# Patient Record
Sex: Male | Born: 1955 | Race: White | Hispanic: No | Marital: Married | Smoking: Former smoker
Health system: Southern US, Community
[De-identification: ages and names within clinical notes are randomized; demographics above are authoritative.]

## PROBLEM LIST (undated history)

## (undated) DIAGNOSIS — E785 Hyperlipidemia, unspecified: Secondary | ICD-10-CM

## (undated) DIAGNOSIS — K567 Ileus, unspecified: Secondary | ICD-10-CM

## (undated) DIAGNOSIS — J449 Chronic obstructive pulmonary disease, unspecified: Secondary | ICD-10-CM

## (undated) DIAGNOSIS — I519 Heart disease, unspecified: Secondary | ICD-10-CM

## (undated) DIAGNOSIS — K922 Gastrointestinal hemorrhage, unspecified: Secondary | ICD-10-CM

## (undated) DIAGNOSIS — Z95810 Presence of automatic (implantable) cardiac defibrillator: Secondary | ICD-10-CM

## (undated) DIAGNOSIS — I509 Heart failure, unspecified: Secondary | ICD-10-CM

## (undated) DIAGNOSIS — I2510 Atherosclerotic heart disease of native coronary artery without angina pectoris: Secondary | ICD-10-CM

## (undated) DIAGNOSIS — B9562 Methicillin resistant Staphylococcus aureus infection as the cause of diseases classified elsewhere: Secondary | ICD-10-CM

## (undated) DIAGNOSIS — R7881 Bacteremia: Secondary | ICD-10-CM

## (undated) DIAGNOSIS — K56609 Unspecified intestinal obstruction, unspecified as to partial versus complete obstruction: Secondary | ICD-10-CM

## (undated) DIAGNOSIS — I10 Essential (primary) hypertension: Secondary | ICD-10-CM

## (undated) HISTORY — DX: Ileus, unspecified: K56.7

## (undated) HISTORY — PX: CORONARY ARTERY BYPASS GRAFT: SHX141

## (undated) HISTORY — DX: Presence of automatic (implantable) cardiac defibrillator: Z95.810

## (undated) HISTORY — DX: Chronic obstructive pulmonary disease, unspecified: J44.9

## (undated) HISTORY — DX: Bacteremia: R78.81

## (undated) HISTORY — DX: Hyperlipidemia, unspecified: E78.5

## (undated) HISTORY — DX: Atherosclerotic heart disease of native coronary artery without angina pectoris: I25.10

## (undated) HISTORY — PX: OTHER SURGICAL HISTORY: SHX169

## (undated) HISTORY — DX: Heart failure, unspecified: I50.9

## (undated) HISTORY — DX: Gastrointestinal hemorrhage, unspecified: K92.2

## (undated) HISTORY — PX: CARDIAC DEFIBRILLATOR PLACEMENT: SHX171

## (undated) HISTORY — DX: Essential (primary) hypertension: I10

## (undated) HISTORY — DX: Methicillin resistant Staphylococcus aureus infection as the cause of diseases classified elsewhere: B95.62

## (undated) HISTORY — DX: Unspecified intestinal obstruction, unspecified as to partial versus complete obstruction: K56.609

## (undated) HISTORY — DX: Heart disease, unspecified: I51.9

---

## 1999-03-16 ENCOUNTER — Encounter: Payer: Self-pay | Admitting: Cardiothoracic Surgery

## 1999-03-16 ENCOUNTER — Inpatient Hospital Stay (HOSPITAL_COMMUNITY): Admission: EM | Admit: 1999-03-16 | Discharge: 1999-03-24 | Payer: Self-pay | Admitting: Cardiology

## 1999-03-17 ENCOUNTER — Encounter: Payer: Self-pay | Admitting: Cardiothoracic Surgery

## 1999-03-18 ENCOUNTER — Encounter: Payer: Self-pay | Admitting: Cardiothoracic Surgery

## 1999-03-19 ENCOUNTER — Encounter: Payer: Self-pay | Admitting: Cardiothoracic Surgery

## 1999-03-20 ENCOUNTER — Encounter: Payer: Self-pay | Admitting: Cardiothoracic Surgery

## 1999-03-21 ENCOUNTER — Encounter: Payer: Self-pay | Admitting: Cardiothoracic Surgery

## 2004-05-23 ENCOUNTER — Ambulatory Visit (HOSPITAL_COMMUNITY): Admission: RE | Admit: 2004-05-23 | Discharge: 2004-05-23 | Payer: Self-pay | Admitting: Cardiology

## 2004-11-04 ENCOUNTER — Ambulatory Visit (HOSPITAL_COMMUNITY): Admission: RE | Admit: 2004-11-04 | Discharge: 2004-11-04 | Payer: Self-pay | Admitting: *Deleted

## 2004-11-27 DIAGNOSIS — K567 Ileus, unspecified: Secondary | ICD-10-CM

## 2004-11-27 HISTORY — DX: Ileus, unspecified: K56.7

## 2004-12-13 ENCOUNTER — Ambulatory Visit: Payer: Self-pay | Admitting: Family Medicine

## 2005-03-21 ENCOUNTER — Ambulatory Visit: Payer: Self-pay | Admitting: Family Medicine

## 2005-04-18 ENCOUNTER — Ambulatory Visit: Payer: Self-pay | Admitting: Family Medicine

## 2005-05-08 ENCOUNTER — Ambulatory Visit: Payer: Self-pay | Admitting: *Deleted

## 2005-05-11 ENCOUNTER — Ambulatory Visit: Payer: Self-pay | Admitting: Cardiology

## 2005-05-31 ENCOUNTER — Ambulatory Visit: Payer: Self-pay | Admitting: Family Medicine

## 2005-06-02 ENCOUNTER — Ambulatory Visit: Payer: Self-pay | Admitting: *Deleted

## 2005-06-02 ENCOUNTER — Inpatient Hospital Stay (HOSPITAL_COMMUNITY): Admission: EM | Admit: 2005-06-02 | Discharge: 2005-06-04 | Payer: Self-pay | Admitting: Emergency Medicine

## 2005-06-06 ENCOUNTER — Ambulatory Visit: Payer: Self-pay | Admitting: Cardiology

## 2005-06-06 ENCOUNTER — Inpatient Hospital Stay (HOSPITAL_COMMUNITY): Admission: EM | Admit: 2005-06-06 | Discharge: 2005-06-11 | Payer: Self-pay | Admitting: Emergency Medicine

## 2005-06-13 ENCOUNTER — Ambulatory Visit: Payer: Self-pay | Admitting: Family Medicine

## 2005-06-14 ENCOUNTER — Ambulatory Visit: Payer: Self-pay | Admitting: Cardiology

## 2005-07-14 ENCOUNTER — Ambulatory Visit: Payer: Self-pay | Admitting: Cardiology

## 2005-07-20 ENCOUNTER — Ambulatory Visit: Payer: Self-pay | Admitting: Family Medicine

## 2005-08-04 ENCOUNTER — Ambulatory Visit: Payer: Self-pay | Admitting: *Deleted

## 2005-09-01 ENCOUNTER — Ambulatory Visit: Payer: Self-pay | Admitting: *Deleted

## 2005-09-21 ENCOUNTER — Ambulatory Visit: Payer: Self-pay | Admitting: Family Medicine

## 2005-10-13 ENCOUNTER — Ambulatory Visit: Payer: Self-pay | Admitting: *Deleted

## 2005-11-10 ENCOUNTER — Ambulatory Visit: Payer: Self-pay | Admitting: *Deleted

## 2005-11-27 DIAGNOSIS — K56609 Unspecified intestinal obstruction, unspecified as to partial versus complete obstruction: Secondary | ICD-10-CM

## 2005-11-27 HISTORY — DX: Unspecified intestinal obstruction, unspecified as to partial versus complete obstruction: K56.609

## 2005-12-15 ENCOUNTER — Ambulatory Visit: Payer: Self-pay | Admitting: *Deleted

## 2006-01-17 ENCOUNTER — Ambulatory Visit: Payer: Self-pay | Admitting: Family Medicine

## 2006-01-26 ENCOUNTER — Ambulatory Visit: Payer: Self-pay | Admitting: *Deleted

## 2006-03-02 ENCOUNTER — Inpatient Hospital Stay (HOSPITAL_COMMUNITY): Admission: EM | Admit: 2006-03-02 | Discharge: 2006-03-05 | Payer: Self-pay | Admitting: Emergency Medicine

## 2006-03-21 ENCOUNTER — Ambulatory Visit: Payer: Self-pay | Admitting: Family Medicine

## 2006-03-30 ENCOUNTER — Ambulatory Visit: Payer: Self-pay | Admitting: *Deleted

## 2006-04-27 ENCOUNTER — Ambulatory Visit: Payer: Self-pay | Admitting: *Deleted

## 2006-05-09 ENCOUNTER — Ambulatory Visit: Payer: Self-pay | Admitting: Cardiology

## 2006-05-24 ENCOUNTER — Ambulatory Visit: Payer: Self-pay | Admitting: Family Medicine

## 2006-06-01 ENCOUNTER — Ambulatory Visit: Payer: Self-pay | Admitting: Cardiology

## 2006-07-04 ENCOUNTER — Ambulatory Visit: Payer: Self-pay | Admitting: *Deleted

## 2006-08-10 ENCOUNTER — Ambulatory Visit: Payer: Self-pay | Admitting: Cardiology

## 2006-09-14 ENCOUNTER — Ambulatory Visit: Payer: Self-pay | Admitting: Cardiology

## 2006-09-24 ENCOUNTER — Ambulatory Visit: Payer: Self-pay | Admitting: Family Medicine

## 2006-10-26 ENCOUNTER — Ambulatory Visit: Payer: Self-pay | Admitting: Cardiology

## 2006-11-30 ENCOUNTER — Ambulatory Visit: Payer: Self-pay | Admitting: Cardiology

## 2006-12-19 ENCOUNTER — Ambulatory Visit: Payer: Self-pay | Admitting: Family Medicine

## 2007-01-18 ENCOUNTER — Ambulatory Visit: Payer: Self-pay | Admitting: Cardiology

## 2007-03-06 ENCOUNTER — Ambulatory Visit: Payer: Self-pay | Admitting: Cardiology

## 2007-04-12 ENCOUNTER — Ambulatory Visit: Payer: Self-pay | Admitting: Cardiology

## 2007-04-24 ENCOUNTER — Ambulatory Visit: Payer: Self-pay | Admitting: Family Medicine

## 2007-05-03 ENCOUNTER — Ambulatory Visit: Payer: Self-pay | Admitting: Internal Medicine

## 2007-05-15 ENCOUNTER — Ambulatory Visit: Payer: Self-pay | Admitting: Cardiology

## 2007-06-07 ENCOUNTER — Ambulatory Visit: Payer: Self-pay | Admitting: Cardiology

## 2007-08-30 ENCOUNTER — Ambulatory Visit: Payer: Self-pay | Admitting: Cardiology

## 2007-10-04 ENCOUNTER — Ambulatory Visit: Payer: Self-pay | Admitting: Cardiology

## 2007-11-05 ENCOUNTER — Ambulatory Visit: Payer: Self-pay | Admitting: Cardiology

## 2007-12-06 ENCOUNTER — Ambulatory Visit: Payer: Self-pay | Admitting: Cardiology

## 2008-01-09 ENCOUNTER — Ambulatory Visit: Payer: Self-pay | Admitting: Cardiology

## 2008-02-07 ENCOUNTER — Ambulatory Visit: Payer: Self-pay | Admitting: Cardiology

## 2008-03-13 ENCOUNTER — Ambulatory Visit: Payer: Self-pay | Admitting: Cardiology

## 2008-04-10 ENCOUNTER — Ambulatory Visit: Payer: Self-pay | Admitting: Cardiology

## 2008-05-08 ENCOUNTER — Ambulatory Visit: Payer: Self-pay | Admitting: Cardiology

## 2008-05-27 ENCOUNTER — Ambulatory Visit: Payer: Self-pay | Admitting: Cardiology

## 2008-06-09 ENCOUNTER — Ambulatory Visit: Payer: Self-pay | Admitting: Cardiology

## 2008-06-09 ENCOUNTER — Encounter (HOSPITAL_COMMUNITY): Admission: RE | Admit: 2008-06-09 | Discharge: 2008-07-09 | Payer: Self-pay | Admitting: Cardiology

## 2008-08-20 ENCOUNTER — Ambulatory Visit: Payer: Self-pay | Admitting: Cardiology

## 2008-09-24 ENCOUNTER — Ambulatory Visit: Payer: Self-pay | Admitting: Cardiology

## 2008-10-29 ENCOUNTER — Ambulatory Visit: Payer: Self-pay | Admitting: Cardiology

## 2008-11-30 ENCOUNTER — Ambulatory Visit: Payer: Self-pay | Admitting: Cardiology

## 2009-02-02 ENCOUNTER — Ambulatory Visit: Payer: Self-pay | Admitting: Internal Medicine

## 2009-02-02 ENCOUNTER — Encounter: Payer: Self-pay | Admitting: Gastroenterology

## 2009-02-02 DIAGNOSIS — E1169 Type 2 diabetes mellitus with other specified complication: Secondary | ICD-10-CM | POA: Insufficient documentation

## 2009-02-02 DIAGNOSIS — E785 Hyperlipidemia, unspecified: Secondary | ICD-10-CM

## 2009-02-03 ENCOUNTER — Encounter: Payer: Self-pay | Admitting: Internal Medicine

## 2009-02-04 ENCOUNTER — Telehealth (INDEPENDENT_AMBULATORY_CARE_PROVIDER_SITE_OTHER): Payer: Self-pay | Admitting: *Deleted

## 2009-02-18 ENCOUNTER — Ambulatory Visit: Payer: Self-pay | Admitting: Cardiology

## 2009-03-11 ENCOUNTER — Ambulatory Visit: Payer: Self-pay | Admitting: Cardiology

## 2009-03-18 ENCOUNTER — Ambulatory Visit: Payer: Self-pay | Admitting: Cardiology

## 2009-03-20 ENCOUNTER — Emergency Department (HOSPITAL_COMMUNITY): Admission: EM | Admit: 2009-03-20 | Discharge: 2009-03-21 | Payer: Self-pay | Admitting: Emergency Medicine

## 2009-04-08 ENCOUNTER — Ambulatory Visit: Payer: Self-pay | Admitting: Cardiology

## 2009-05-10 ENCOUNTER — Ambulatory Visit: Payer: Self-pay | Admitting: Cardiology

## 2009-05-19 ENCOUNTER — Encounter (INDEPENDENT_AMBULATORY_CARE_PROVIDER_SITE_OTHER): Payer: Self-pay | Admitting: *Deleted

## 2009-05-28 ENCOUNTER — Encounter: Payer: Self-pay | Admitting: Cardiology

## 2009-06-10 ENCOUNTER — Ambulatory Visit: Payer: Self-pay | Admitting: Cardiology

## 2009-06-10 ENCOUNTER — Encounter: Payer: Self-pay | Admitting: Cardiology

## 2009-06-23 ENCOUNTER — Ambulatory Visit: Payer: Self-pay | Admitting: Cardiology

## 2009-06-24 ENCOUNTER — Ambulatory Visit: Payer: Self-pay | Admitting: Cardiology

## 2009-07-09 ENCOUNTER — Encounter (INDEPENDENT_AMBULATORY_CARE_PROVIDER_SITE_OTHER): Payer: Self-pay | Admitting: *Deleted

## 2009-07-12 ENCOUNTER — Encounter: Payer: Self-pay | Admitting: *Deleted

## 2009-07-22 ENCOUNTER — Ambulatory Visit: Payer: Self-pay | Admitting: Cardiology

## 2009-08-12 ENCOUNTER — Ambulatory Visit: Payer: Self-pay | Admitting: Cardiology

## 2009-09-13 ENCOUNTER — Ambulatory Visit: Payer: Self-pay | Admitting: Cardiology

## 2009-09-13 LAB — CONVERTED CEMR LAB: POC INR: 2.4

## 2009-10-11 ENCOUNTER — Ambulatory Visit: Payer: Self-pay | Admitting: Cardiology

## 2009-10-28 ENCOUNTER — Ambulatory Visit (HOSPITAL_COMMUNITY): Admission: RE | Admit: 2009-10-28 | Discharge: 2009-10-28 | Payer: Self-pay | Admitting: Cardiovascular Disease

## 2009-10-28 ENCOUNTER — Encounter: Payer: Self-pay | Admitting: Cardiovascular Disease

## 2009-10-28 ENCOUNTER — Ambulatory Visit: Payer: Self-pay | Admitting: Cardiology

## 2009-11-03 ENCOUNTER — Encounter: Payer: Self-pay | Admitting: Cardiology

## 2009-11-03 ENCOUNTER — Ambulatory Visit: Payer: Self-pay | Admitting: Cardiology

## 2009-11-03 DIAGNOSIS — I2589 Other forms of chronic ischemic heart disease: Secondary | ICD-10-CM | POA: Insufficient documentation

## 2009-11-03 DIAGNOSIS — I255 Ischemic cardiomyopathy: Secondary | ICD-10-CM | POA: Insufficient documentation

## 2009-11-16 ENCOUNTER — Telehealth: Payer: Self-pay | Admitting: Cardiology

## 2009-11-18 ENCOUNTER — Ambulatory Visit: Payer: Self-pay | Admitting: Cardiology

## 2009-11-18 LAB — CONVERTED CEMR LAB: POC INR: 2.1

## 2009-11-23 ENCOUNTER — Encounter: Payer: Self-pay | Admitting: Cardiology

## 2009-12-03 ENCOUNTER — Telehealth: Payer: Self-pay | Admitting: Cardiology

## 2009-12-03 ENCOUNTER — Encounter: Payer: Self-pay | Admitting: Cardiology

## 2009-12-06 ENCOUNTER — Telehealth (INDEPENDENT_AMBULATORY_CARE_PROVIDER_SITE_OTHER): Payer: Self-pay | Admitting: *Deleted

## 2009-12-06 ENCOUNTER — Encounter: Payer: Self-pay | Admitting: Cardiology

## 2009-12-10 ENCOUNTER — Inpatient Hospital Stay (HOSPITAL_BASED_OUTPATIENT_CLINIC_OR_DEPARTMENT_OTHER): Admission: RE | Admit: 2009-12-10 | Discharge: 2009-12-10 | Payer: Self-pay | Admitting: Cardiology

## 2009-12-10 ENCOUNTER — Ambulatory Visit: Payer: Self-pay | Admitting: Cardiology

## 2009-12-20 ENCOUNTER — Ambulatory Visit: Payer: Self-pay | Admitting: Cardiology

## 2009-12-20 ENCOUNTER — Ambulatory Visit: Payer: Self-pay | Admitting: Internal Medicine

## 2009-12-23 ENCOUNTER — Telehealth: Payer: Self-pay | Admitting: Cardiology

## 2009-12-28 ENCOUNTER — Ambulatory Visit (HOSPITAL_COMMUNITY): Admission: RE | Admit: 2009-12-28 | Discharge: 2009-12-28 | Payer: Self-pay | Admitting: Internal Medicine

## 2010-01-01 ENCOUNTER — Encounter (INDEPENDENT_AMBULATORY_CARE_PROVIDER_SITE_OTHER): Payer: Self-pay | Admitting: *Deleted

## 2010-01-01 LAB — CONVERTED CEMR LAB
BUN: 12 mg/dL
CO2: 26 meq/L
Chloride: 106 meq/L
Creatinine, Ser: 1.08 mg/dL
Glucose, Bld: 96 mg/dL
Prothrombin Time: 18.1 s

## 2010-01-03 ENCOUNTER — Encounter: Payer: Self-pay | Admitting: Internal Medicine

## 2010-01-05 ENCOUNTER — Ambulatory Visit: Payer: Self-pay | Admitting: Internal Medicine

## 2010-01-05 ENCOUNTER — Observation Stay (HOSPITAL_COMMUNITY): Admission: AD | Admit: 2010-01-05 | Discharge: 2010-01-06 | Payer: Self-pay | Admitting: Internal Medicine

## 2010-01-05 DIAGNOSIS — Z95810 Presence of automatic (implantable) cardiac defibrillator: Secondary | ICD-10-CM

## 2010-01-05 HISTORY — PX: INSERT / REPLACE / REMOVE PACEMAKER: SUR710

## 2010-01-05 HISTORY — DX: Presence of automatic (implantable) cardiac defibrillator: Z95.810

## 2010-01-06 ENCOUNTER — Encounter: Payer: Self-pay | Admitting: Internal Medicine

## 2010-01-06 ENCOUNTER — Encounter (INDEPENDENT_AMBULATORY_CARE_PROVIDER_SITE_OTHER): Payer: Self-pay | Admitting: *Deleted

## 2010-01-12 ENCOUNTER — Ambulatory Visit: Payer: Self-pay | Admitting: Cardiology

## 2010-01-12 DIAGNOSIS — Z95810 Presence of automatic (implantable) cardiac defibrillator: Secondary | ICD-10-CM | POA: Insufficient documentation

## 2010-01-14 ENCOUNTER — Ambulatory Visit: Payer: Self-pay | Admitting: Cardiovascular Disease

## 2010-02-10 ENCOUNTER — Ambulatory Visit: Payer: Self-pay | Admitting: Cardiology

## 2010-03-08 ENCOUNTER — Ambulatory Visit: Payer: Self-pay | Admitting: Cardiology

## 2010-03-08 ENCOUNTER — Ambulatory Visit: Payer: Self-pay | Admitting: Internal Medicine

## 2010-03-08 ENCOUNTER — Inpatient Hospital Stay (HOSPITAL_COMMUNITY): Admission: EM | Admit: 2010-03-08 | Discharge: 2010-03-10 | Payer: Self-pay | Admitting: Emergency Medicine

## 2010-03-08 ENCOUNTER — Encounter (INDEPENDENT_AMBULATORY_CARE_PROVIDER_SITE_OTHER): Payer: Self-pay | Admitting: Internal Medicine

## 2010-03-12 ENCOUNTER — Ambulatory Visit: Payer: Self-pay | Admitting: Cardiology

## 2010-03-12 ENCOUNTER — Inpatient Hospital Stay (HOSPITAL_COMMUNITY): Admission: EM | Admit: 2010-03-12 | Discharge: 2010-03-21 | Payer: Self-pay | Admitting: Internal Medicine

## 2010-03-12 ENCOUNTER — Encounter: Payer: Self-pay | Admitting: Emergency Medicine

## 2010-03-13 ENCOUNTER — Encounter (INDEPENDENT_AMBULATORY_CARE_PROVIDER_SITE_OTHER): Payer: Self-pay | Admitting: Internal Medicine

## 2010-03-14 ENCOUNTER — Encounter (INDEPENDENT_AMBULATORY_CARE_PROVIDER_SITE_OTHER): Payer: Self-pay | Admitting: Internal Medicine

## 2010-03-15 ENCOUNTER — Ambulatory Visit: Payer: Self-pay | Admitting: Infectious Diseases

## 2010-03-15 ENCOUNTER — Ambulatory Visit: Payer: Self-pay | Admitting: Vascular Surgery

## 2010-03-16 ENCOUNTER — Encounter (INDEPENDENT_AMBULATORY_CARE_PROVIDER_SITE_OTHER): Payer: Self-pay | Admitting: Internal Medicine

## 2010-03-23 ENCOUNTER — Encounter: Payer: Self-pay | Admitting: Internal Medicine

## 2010-03-24 ENCOUNTER — Encounter: Payer: Self-pay | Admitting: Cardiology

## 2010-03-29 ENCOUNTER — Encounter: Payer: Self-pay | Admitting: Cardiology

## 2010-03-30 ENCOUNTER — Ambulatory Visit: Payer: Self-pay | Admitting: Cardiology

## 2010-03-30 DIAGNOSIS — R7881 Bacteremia: Secondary | ICD-10-CM | POA: Insufficient documentation

## 2010-04-04 ENCOUNTER — Ambulatory Visit: Payer: Self-pay | Admitting: Internal Medicine

## 2010-04-05 ENCOUNTER — Encounter: Payer: Self-pay | Admitting: Internal Medicine

## 2010-04-07 ENCOUNTER — Ambulatory Visit: Payer: Self-pay | Admitting: Vascular Surgery

## 2010-04-11 ENCOUNTER — Ambulatory Visit: Payer: Self-pay | Admitting: Internal Medicine

## 2010-04-19 ENCOUNTER — Encounter: Payer: Self-pay | Admitting: Cardiology

## 2010-04-19 ENCOUNTER — Telehealth: Payer: Self-pay | Admitting: Cardiology

## 2010-04-20 ENCOUNTER — Encounter: Payer: Self-pay | Admitting: Cardiology

## 2010-04-21 ENCOUNTER — Encounter: Payer: Self-pay | Admitting: Cardiology

## 2010-04-22 ENCOUNTER — Ambulatory Visit: Payer: Self-pay | Admitting: Vascular Surgery

## 2010-04-28 ENCOUNTER — Encounter (INDEPENDENT_AMBULATORY_CARE_PROVIDER_SITE_OTHER): Payer: Self-pay

## 2010-05-04 ENCOUNTER — Ambulatory Visit: Payer: Self-pay | Admitting: Cardiology

## 2010-05-09 ENCOUNTER — Ambulatory Visit: Payer: Self-pay | Admitting: Internal Medicine

## 2010-05-09 ENCOUNTER — Ambulatory Visit (HOSPITAL_COMMUNITY)
Admission: RE | Admit: 2010-05-09 | Discharge: 2010-05-09 | Payer: Self-pay | Source: Home / Self Care | Admitting: Internal Medicine

## 2010-05-09 ENCOUNTER — Telehealth (INDEPENDENT_AMBULATORY_CARE_PROVIDER_SITE_OTHER): Payer: Self-pay | Admitting: *Deleted

## 2010-05-14 ENCOUNTER — Encounter: Payer: Self-pay | Admitting: Internal Medicine

## 2010-05-16 ENCOUNTER — Encounter: Payer: Self-pay | Admitting: Internal Medicine

## 2010-07-07 ENCOUNTER — Ambulatory Visit: Payer: Self-pay | Admitting: Cardiology

## 2010-07-07 ENCOUNTER — Encounter: Payer: Self-pay | Admitting: Internal Medicine

## 2010-09-14 ENCOUNTER — Ambulatory Visit: Payer: Self-pay | Admitting: Cardiology

## 2010-10-11 ENCOUNTER — Encounter: Payer: Self-pay | Admitting: Internal Medicine

## 2010-10-11 ENCOUNTER — Ambulatory Visit: Payer: Self-pay | Admitting: Cardiology

## 2010-12-27 NOTE — Assessment & Plan Note (Signed)
Summary: PT NEEDS TO BE SEEN TO SET UP TCS PT IS ON CUMADIN/CM   Visit Type:  Follow-up Visit Primary Care Provider:  [place]  Chief Complaint:  pt needs tcs.  History of Present Illness: Here to set up colonscopy.  Finished vancomycin for septic thrombophlebitis 2 days ago.  Still on rifampin for this.  Denies rectal bleeding or melena.  Home health did blood draw last week ? results. Appt Dr Lysbeth Galas next Tues.  Denies abd pain, heartburn, indigestion.  BM every other day.  Off coumadin since 1st hospitalization.  Sees Dr Antoine Poche, takes ASA 81mg  daily.  Recent hospitalization for Mallory Weiss tear while on coumadin.  He had EGD and colonoscopy was planned to be done outpatient.  Hx [employer]. Jude internal defibrillator.       Current Problems (verified): 1)  Bacteremia  (ICD-790.7) 2)  Implantation of Defibrillator, Hx of  (ICD-V45.02) 3)  Cardiomyopathy, Ischemic  (ICD-414.8) 4)  Unspecified Pre-operative Examination  (ICD-V72.84) 5)  Cardiomyopathy  (ICD-425.4) 6)  Other Spec Forms Chronic Ischemic Heart Disease  (ICD-414.8) 7)  Screening, Colorectal Cancer  (ICD-V76.51) 8)  Hyperlipidemia  (ICD-272.4)  Current Medications (verified): 1)  Klor-Con M10 10 Meq Cr-Tabs (Potassium Chloride Crys Cr) .... 2 By Mouth Once Daily 2)  Enalapril Maleate 20 Mg Tabs (Enalapril Maleate) .Marland Kitchen.. 1 By Mouth Daily 3)  Bisoprolol-Hydrochlorothiazide 10-6.25 Mg Tabs (Bisoprolol-Hydrochlorothiazide) .Marland Kitchen.. 1 Daily 4)  Lipitor 80 Mg Tabs (Atorvastatin Calcium) .Marland Kitchen.. 1 By Mouth Daily 5)  Niaspan 500 Mg Cr-Tabs (Niacin (Antihyperlipidemic)) .Marland Kitchen.. 1 By Mouth Once Daily 6)  Nitrostat 0.4 Mg Subl (Nitroglycerin) .Marland Kitchen.. 1 By Mouth As Needed 7)  Furosemide 20 Mg Tabs (Furosemide) .Marland Kitchen.. 1 By Mouth Two Times A Day 8)  Rifampin 300 Mg Caps (Rifampin) .Marland Kitchen.. 1 By Mouth Two Times A Day 9)  Omega-3 350 Mg Caps (Omega-3 Fatty Acids) .Marland Kitchen.. 1 By Mouth Daily 10)  Omeprazole 20 Mg Cpdr (Omeprazole) .Marland Kitchen.. 1 By Mouth Daily 11)   Acetaminophen 325 Mg  Tabs (Acetaminophen) .Marland Kitchen.. 1 By Mouth As Needed 12)  Aspir-Low 81 Mg Tbec (Aspirin) .... 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   The LAD was occludedproximally.  There were mid and distal luminal irregularities.  The vessel was seen to fill via the LIMA graft.  There were 4 small diagonals.  They were patent.  There was a proximal first diagonal, which was large with 99% stenosis and filled via vein graft.  The circumflex had 99% proximal stenosis in the AV groove.  There was an obtuse marginal one, which was moderate sized with 99% ostial stenosis. An obtuse marginal two, which was occluded at the ostium.  The right coronary artery was occluded distally.  There was severe diffuse mid disease.  The PDA was large and normal.Grafts:  Saphenous vein graft to the PDA had 30% stenosis at the anastomosis, but was otherwise widely patent.  Saphenous vein graft sequential to OM1 and OM2 was widely patent.  Saphenous vein graft to the diagonal was widely patent.  A LIMA to the LAD was widely patent.)    Family History: No  known family history of colorectal carcinoma, IBD, liver or chronic GI problems.  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 Quit 23 years ago Alcohol Use - no Illicit Drug Use - no  Review of Systems General:  Complains of fatigue and weakness; denies fever, chills, sweats, anorexia, malaise, weight loss, and sleep disorder. CV:  Denies chest pains, angina, palpitations, syncope, dyspnea on exertion, orthopnea, PND, peripheral edema, and claudication. Resp:  Denies dyspnea at rest, dyspnea with exercise, cough, sputum, wheezing, coughing up blood, and pleurisy. GI:  Denies difficulty swallowing, pain on swallowing, nausea, indigestion/heartburn, vomiting, vomiting blood, abdominal pain, jaundice, gas/bloating, diarrhea, constipation, change in bowel habits, bloody BM's, black BMs, and fecal incontinence. GU:  See HPI; Denies urinary burning, blood in urine, urinary frequency, urinary hesitancy, nocturnal urination, and urinary incontinence. Psych:  Denies depression, anxiety, memory loss, suicidal ideation, hallucinations, paranoia, phobia, and confusion. Heme:  Denies bruising, bleeding, and enlarged lymph nodes.  Vital Signs:  Patient profile:   55 year old male Height:      70 inches Weight:      160 pounds BMI:     23.04 Temp:     97.9 degrees F oral Pulse rate:   84 / minute  Vitals Entered By: Hendricks Limes LPN (Apr 11, 2010 2:19 PM)  Physical Exam  General:  Well developed, well nourished, no acute distress. Head:  Normocephalic and atraumatic. Eyes:  Sclera clear, no icterus. Ears:  Normal auditory acuity. Nose:  No deformity, discharge,  or lesions. Mouth:  No deformity or lesions, dentition normal. Neck:  Supple; no masses or thyromegaly. Chest Wall:  defibrillator Left upper ant chest Lungs:  Clear throughout to auscultation. Heart:  Regular rate and rhythm; no murmurs, rubs,  or bruits. Abdomen:  Soft, nontender and nondistended. No masses, hepatosplenomegaly or hernias noted. Normal bowel sounds.without guarding and without rebound.   Msk:  Symmetrical with no gross  deformities. Normal posture. Pulses:  Normal pulses noted. Neurologic:  Alert and  oriented x4;  grossly normal neurologically. Skin:  Bandages to bilat upper extremities Cervical Nodes:  No significant cervical adenopathy. Psych:  Alert and cooperative. Normal mood and affect.  Impression & Recommendations:  Problem # 1:  SCREENING, COLORECTAL CANCER (ICD-V76.51) 55 y/o caucasian male here to set up screening colonoscopy.  Hx Mallory Weiss tear.  Recent hospitalization for septic thrombophlebitis just completing antibiotics.  I asked him to push procedure out a couple wks to give time to recover from recent infection.    Screeening colonoscopy to be performed by Dr. Jena Gauss in the near future.  I have discussed risks and benefits which include, but are not limited to, bleeding, infection, perforation, or medication reaction.  The patient agrees with this plan and consent will be obtained.  Pt has defibrillator which will need to be managed by endoscopy during procedure  Orders: Est. Patient Level III (34742)

## 2010-12-27 NOTE — Cardiovascular Report (Signed)
Summary: Pre Op Orders   Pre Op Orders   Imported By: Roderic Ovens 12/13/2009 11:33:49  _____________________________________________________________________  External Attachment:    Type:   Image     Comment:   External Document

## 2010-12-27 NOTE — Progress Notes (Signed)
Summary: did pt have surgery   Phone Note From Other Clinic   Caller: Norwich Sink from [place] [phone] Request: Talk with Nurse Details for Reason:  did pt have surgery Initial call taken by: Lorne Skeens,  December 23, 2009 12:42 PM  Follow-up for Phone Call        PT DID HAVE CARDIAC CATH AND WAS SET UP TO SEE DR Ladona Ridgel - HE WAS SEEN 12/20/2009 AND IS TO HAVE A DEFIB PLACED.  I DO NOT HAVE THAT DATE AS OF YET Follow-up by: Charolotte Capuchin, RN,  December 23, 2009 3:43 PM

## 2010-12-27 NOTE — Procedures (Signed)
Summary: wound check  Medications Added FISH OIL 1000 MG CAPS (OMEGA-3 FATTY ACIDS) 1 in the am 2 in the evening      Allergies Added: NKDA  Current Medications (verified): 1)  Klor-Con M10 10 Meq Cr-Tabs (Potassium Chloride Crys Cr) .... 2 By Mouth Once Daily 2)  Enalapril Maleate 20 Mg Tabs (Enalapril Maleate) .Marland Kitchen.. 1 Twice A  Day 3)  Bisoprolol-Hydrochlorothiazide 10-6.25 Mg Tabs (Bisoprolol-Hydrochlorothiazide) .Marland Kitchen.. 1 By Mouth Once Daily 4)  Lipitor 80 Mg Tabs (Atorvastatin Calcium) .Marland Kitchen.. 1 By Mouth Daily 5)  Niaspan 500 Mg Cr-Tabs (Niacin (Antihyperlipidemic)) .Marland Kitchen.. 1 By Mouth Once Daily 6)  Fish Oil 1000 Mg Caps (Omega-3 Fatty Acids) .Marland Kitchen.. 1 in The Am 2 in The Evening 7)  Coumadin 5 Mg Tabs (Warfarin Sodium) .... Take 7.5mg  X 6 Days, Then Take 5mg  X 1 Day 8)  Nitrostat 0.4 Mg Subl (Nitroglycerin) .Marland Kitchen.. 1 By Mouth As Needed  Allergies (verified): No Known Drug Allergies   ICD Specifications Following MD:  Lewayne Bunting, MD     ICD Vendor:  St Jude     ICD Model Number:  ZO1096-04     ICD Serial Number:  [id number] ICD DOI:  01/05/2010     ICD Implanting MD:  Lewayne Bunting, MD  Lead 1:    Location: RV     DOI: 01/05/2010     Model #: 7120     Serial #: [id number]     Status: active  Indications::  CM   ICD Follow Up Remote Check?  No Charge Time:  7.8 seconds     Battery Est. Longevity:  8.5 years Underlying rhythm:  SR ICD Dependent:  No       ICD Device Measurements Right Ventricle:  Amplitude: 11.9 mV, Impedance: 510 ohms, Threshold: 0.5 V at 0.4 msec Shock Impedance: 44 ohms   Episodes Coumadin:  Yes Shock:  0     ATP:  0     Nonsustained:  0     Ventricular Pacing:  0%  Brady Parameters Mode VVI     Lower Rate Limit:  40      Tachy Zones VF:  240     VT:  200     VT1:  179     Next Cardiology Appt Due:  03/27/2010 Tech Comments:  No parameter changes. Device function normal.  Steri strips removed, no redness or edema.   Activity restrictions and device discharge  instructions reviewed with the patient and he is in agreement.  ROV 3 months Dr. Ladona Ridgel in [facility]. Altha Harm, LPN  January 14, 2010 11:51 AM

## 2010-12-27 NOTE — Miscellaneous (Signed)
Clinical Lists Changes  Observations: Added new observation of ECHOINTERP:  - Left ventricle: The cavity size was mildly dilated. Wall thickness     was normal. Systolic function was severely reduced. The estimated     ejection fraction was in the range of 25% to 30%. The mid to     apical anteroseptal wall and the inferior wall were severely     hypokinetic. The posterior wall was akinetic. The anterolateral     wall was best-preserved. No definite LV thrombus seen. Doppler     parameters are consistent with restrictive physiology, indicative     of decreased left ventricular diastolic compliance and/or     increased left atrial pressure. E/medial E' > 15 suggests LV end     diastolic pressure at least 20 mmHg.   - Aortic valve: There was no stenosis.   - Mitral valve: Moderate regurgitation. ERO 0.22 cm^2 by PISA.   - Left atrium: The atrium was mildly dilated.   - Pulmonary veins: Systolic blunting of the pulmonary vein doppler     signal.   - Right ventricle: The cavity size was normal. Pacer wire or     catheter noted in right ventricle. Systolic function was normal.   - Pulmonary arteries: PA systolic pressure 35-39 mmHg.   - Systemic veins: IVC measured 2.0 cm with normal respirophasic     variation, suggesting RA pressure 6-10 mmHg.   Impressions:    - Mildly dilated LV with severe systolic dysfunction, EF 25-30%.     Wall motion abnormalities as described above. Severe diastolic     dysfunction with evidence for elevated LV filling pressure.     Moderate mitral regurgitation likely due to tethering of the     posterior leaflet of the mitral valve. Mild pulmonary     hypertension. (03/14/2010 8:38) Added new observation of DOPPLER LEG:   No evidence of deep vein thrombosis involving the left upper   extremity. There is superficial thrombosis in the basilic vein. (03/13/2010 8:39) Added new observation of CT SCAN:  Numerous segments of discontiguous upper limits of normal  caliber   fluid-filled small bowel with smooth tapering at the proximal and   distal aspects of each borderline dilated segment.  Focal ileus or   inflammatory bowel disease are within the differential diagnosis.   The patient reports no history of prior surgery.  This would make   adhesion less likely. The discontiguous nature would make ischemia   and enteritis less likely.  Does the patient have a history of   underlying malignancy that could produce metastatic disease?    No evidence for perforation or abscess formation.  (03/12/2010 8:39) Added new observation of US CAROTID: RIGHT CAROTID ARTERY: Patchy moderate right carotid bifurcation   atherosclerosis and plaque formation.  Slight elevation in the   right ICA velocity measuring 199/64.  In this region, there is   narrowing of the ICA with spectral broadening and turbulent flow.   This meets criteria for a 50-70% moderate right ICA stenosis.    RIGHT VERTEBRAL ARTERY:  Antegrade    LEFT CAROTID ARTERY: Similar degree of moderate left carotid   bifurcation atherosclerosis and plaque formation.  Slight elevation   left ICA velocity measuring 168/65 with associated spectral   broadening and turbulent flow in this region.  This also meets   criteria for a 50 -70% moderate left ICA stenosis.    LEFT VERTEBRAL ARTERY:  Antegrade    IMPRESSION:   Bilateral  moderate carotid atherosclerosis.    50-70% proximal ICA stenoses bilaterally by ultrasound criteria.  (03/09/2010 8:39)      Echocardiogram  Procedure date:  03/14/2010  Findings:       - Left ventricle: The cavity size was mildly dilated. Wall thickness     was normal. Systolic function was severely reduced. The estimated     ejection fraction was in the range of 25% to 30%. The mid to     apical anteroseptal wall and the inferior wall were severely     hypokinetic. The posterior wall was akinetic. The anterolateral     wall was best-preserved. No definite LV  thrombus seen. Doppler     parameters are consistent with restrictive physiology, indicative     of decreased left ventricular diastolic compliance and/or     increased left atrial pressure. E/medial E' > 15 suggests LV end     diastolic pressure at least 20 mmHg.   - Aortic valve: There was no stenosis.   - Mitral valve: Moderate regurgitation. ERO 0.22 cm^2 by PISA.   - Left atrium: The atrium was mildly dilated.   - Pulmonary veins: Systolic blunting of the pulmonary vein doppler     signal.   - Right ventricle: The cavity size was normal. Pacer wire or     catheter noted in right ventricle. Systolic function was normal.   - Pulmonary arteries: PA systolic pressure 35-39 mmHg.   - Systemic veins: IVC measured 2.0 cm with normal respirophasic     variation, suggesting RA pressure 6-10 mmHg.   Impressions:    - Mildly dilated LV with severe systolic dysfunction, EF 25-30%.     Wall motion abnormalities as described above. Severe diastolic     dysfunction with evidence for elevated LV filling pressure.     Moderate mitral regurgitation likely due to tethering of the     posterior leaflet of the mitral valve. Mild pulmonary     hypertension.  Venous Doppler  Procedure date:  03/13/2010  Findings:        No evidence of deep vein thrombosis involving the left upper   extremity. There is superficial thrombosis in the basilic vein.  CT Scan  Procedure date:  03/12/2010  Findings:       Numerous segments of discontiguous upper limits of normal caliber   fluid-filled small bowel with smooth tapering at the proximal and   distal aspects of each borderline dilated segment.  Focal ileus or   inflammatory bowel disease are within the differential diagnosis.   The patient reports no history of prior surgery.  This would make   adhesion less likely. The discontiguous nature would make ischemia   and enteritis less likely.  Does the patient have a history of   underlying malignancy that  could produce metastatic disease?    No evidence for perforation or abscess formation.   Carotid Doppler  Procedure date:  03/09/2010  Findings:      RIGHT CAROTID ARTERY: Patchy moderate right carotid bifurcation   atherosclerosis and plaque formation.  Slight elevation in the   right ICA velocity measuring 199/64.  In this region, there is   narrowing of the ICA with spectral broadening and turbulent flow.   This meets criteria for a 50-70% moderate right ICA stenosis.    RIGHT VERTEBRAL ARTERY:  Antegrade    LEFT CAROTID ARTERY: Similar degree of moderate left carotid   bifurcation atherosclerosis and plaque formation.  Slight elevation  left ICA velocity measuring 168/65 with associated spectral   broadening and turbulent flow in this region.  This also meets   criteria for a 50 -70% moderate left ICA stenosis.    LEFT VERTEBRAL ARTERY:  Antegrade    IMPRESSION:   Bilateral moderate carotid atherosclerosis.    50-70% proximal ICA stenoses bilaterally by ultrasound criteria.

## 2010-12-27 NOTE — Assessment & Plan Note (Signed)
Summary: [facility]      Allergies Added: NKDA  Visit Type:  Follow-up Primary Provider:  Dr. Ardeen Garland  CC:  CAD and Cardiomyopathy.  History of Present Illness: The patient presents for followup of his known coronary disease and cardiomyopathy. He has done quite well following hospitalization earlier this year. There has been no evidence of recurrent bacteremia or infection of his defibrillator. A previously infected left arm thrombosed vein it is healed following surgery. He has had no palpitations, presyncope or syncope. He denies any chest discomfort, neck or arm discomfort. He has had no new shortness of breath, PND or orthopnea. He tries to remain active but is not walking as much as I would like  Current Medications (verified): 1)  Enalapril Maleate 20 Mg Tabs (Enalapril Maleate) .Marland Kitchen.. 1 By Mouth Daily 2)  Bisoprolol-Hydrochlorothiazide 10-6.25 Mg Tabs (Bisoprolol-Hydrochlorothiazide) .Marland Kitchen.. 1 Daily 3)  Lipitor 80 Mg Tabs (Atorvastatin Calcium) .Marland Kitchen.. 1 By Mouth Daily 4)  Niaspan 500 Mg Cr-Tabs (Niacin (Antihyperlipidemic)) .Marland Kitchen.. 1 By Mouth Once Daily 5)  Nitrostat 0.4 Mg Subl (Nitroglycerin) .Marland Kitchen.. 1 By Mouth As Needed 6)  Omega-3 350 Mg Caps (Omega-3 Fatty Acids) .Marland Kitchen.. 1 By Mouth Daily 7)  Omeprazole 20 Mg Cpdr (Omeprazole) .Marland Kitchen.. 1 By Mouth Daily 8)  Acetaminophen 325 Mg  Tabs (Acetaminophen) .Marland Kitchen.. 1 By Mouth As Needed 9)  Aspir-Low 81 Mg Tbec (Aspirin) .... 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   The LAD  was occludedproximally.  There were mid and distal luminal irregularities.  The vessel was seen to fill via the LIMA graft.  There were 4 small diagonals.  They were patent.  There was a proximal first diagonal, which was large with 99% stenosis and filled via vein graft.  The circumflex had 99% proximal stenosis in the AV groove.  There was an obtuse marginal one, which was moderate sized with 99% ostial stenosis. An obtuse marginal two, which was occluded at the ostium.  The right coronary artery was occluded distally.  There was severe diffuse mid disease.  The PDA was large and normal.Grafts:  Saphenous vein graft to the PDA had 30% stenosis at the anastomosis, but was otherwise widely patent.  Saphenous vein graft sequential to OM1 and OM2 was widely patent.  Saphenous vein graft to the diagonal was widely patent.  A LIMA to the LAD was widely patent.)    Past Surgical History: Reviewed history from 03/30/2010 and no changes required. CABG, 6 vessels, 2000 ICD insertion (01/05/10, single chamber St. Jude) Basilic vein left arm resection  Review of Systems       As stated in the HPI and negative for all other systems.   Vital Signs:  Patient profile:   55 year old male Height:      70 inches Weight:      169 pounds BMI:     24.34 Pulse rate:   64 / minute Resp:     16 per minute BP sitting:   126 / 76  (right arm)  Vitals Entered By: Marrion Coy, CNA (September 14, 2010 11:57  AM)  Physical Exam  General:  Well developed, well nourished, no acute distress. Head:  Normocephalic and atraumatic. Eyes:  Sclera clear, no icterus. Mouth:  No deformity or lesions, dentition normal. Neck:  Supple; no masses or thyromegaly. Chest Wall:  Well-healed defibrillator pocket Lungs:  Clear throughout to auscultation. Heart:  Regular rate and rhythm; no murmurs, rubs,  or bruits. Abdomen:  Soft, nontender and nondistended. No masses, hepatosplenomegaly or hernias noted. Normal bowel  sounds.without guarding and without rebound.   Msk:  Symmetrical with no gross deformities. Normal posture. Extremities:  Left hand swelling Neurologic:  Alert and oriented x 3. Skin:  Intact without lesions or rashes. Cervical Nodes:  No significant cervical adenopathy. Axillary Nodes:  no significant adenopathy Inguinal Nodes:  no significant adenopathy Psych:  Normal affect.   EKG  Procedure date:  09/14/2010  Findings:      Sinus rhythm, rate 64, axis within normal limits, old anteroseptal infarct, nonspecific inferior ST depression.   ICD Specifications Following MD:  Lewayne Bunting, MD     ICD Vendor:  [facility] Jude     ICD Model Number:  UE4540-98     ICD Serial Number:  [id number] ICD DOI:  01/05/2010     ICD Implanting MD:  Lewayne Bunting, MD  Lead 1:    Location: RV     DOI: 01/05/2010     Model #: 7120     Serial #: [id number]     Status: active  Indications::  CM   ICD Follow Up ICD Dependent:  No      Episodes Coumadin:  No  Brady Parameters Mode VVI     Lower Rate Limit:  40      Tachy Zones VF:  222     VT:  200     VT1:  179     Impression & Recommendations:  Problem # 1:  CARDIOMYOPATHY, ISCHEMIC (ICD-414.8) He is euvolemic and on a stable medical regimen. No change in therapy is indicated. Orders: EKG w/ Interpretation (93000)  Problem # 2:  BACTEREMIA (ICD-790.7) There has been no evidence of ongoing bacteremia. No further therapy or evaluation is indicated.  Problem # 3:  HYPERLIPIDEMIA (ICD-272.4) This is being treated aggressively by Dr. Lysbeth Galas  Problem # 4:  CAD He will continue with risk reduction.  Patient Instructions: 1)  Your physician recommends that you schedule a follow-up appointment in: 6 months with Dr Antoine Poche 2)  Your physician recommends that you continue on your current medications as directed. Please refer to the Current Medication list given to you today.

## 2010-12-27 NOTE — Letter (Signed)
Summary: [facility]CS ORDER  TCS ORDER   Imported By: Ave Filter 04/11/2010 14:54:29  _____________________________________________________________________  External Attachment:    Type:   Image     Comment:   External Document

## 2010-12-27 NOTE — Progress Notes (Signed)
   Recieved [employer] papers back from [facility]Marland KitchenMarland KitchenI sent copy to [facility] called Pt to Pick up original. [facility]  May 09, 2010 9:56 AM    Appended Document:  did not send copy to [facility]..I kept here and will scan in   Appended Document:  Spoke w/ pt this am, He asked me to send over to [facility] office, I inter Officed to Attention Terry/Tonya.Marland Kitchenkm

## 2010-12-27 NOTE — Letter (Signed)
Summary: Internal Other  Internal Other   Imported By: Peggyann Shoals 03/23/2010 11:23:53  _____________________________________________________________________  External Attachment:    Type:   Image     Comment:   External Document

## 2010-12-27 NOTE — Procedures (Signed)
Summary: 3 mth f/u per checkout on 04/04/10/tg      Allergies Added: NKDA  Current Medications (verified): 1)  Enalapril Maleate 20 Mg Tabs (Enalapril Maleate) .Marland Kitchen.. 1 By Mouth Daily 2)  Bisoprolol-Hydrochlorothiazide 10-6.25 Mg Tabs (Bisoprolol-Hydrochlorothiazide) .Marland Kitchen.. 1 Daily 3)  Lipitor 80 Mg Tabs (Atorvastatin Calcium) .Marland Kitchen.. 1 By Mouth Daily 4)  Niaspan 500 Mg Cr-Tabs (Niacin (Antihyperlipidemic)) .Marland Kitchen.. 1 By Mouth Once Daily 5)  Nitrostat 0.4 Mg Subl (Nitroglycerin) .Marland Kitchen.. 1 By Mouth As Needed 6)  Omega-3 350 Mg Caps (Omega-3 Fatty Acids) .Marland Kitchen.. 1 By Mouth Daily 7)  Omeprazole 20 Mg Cpdr (Omeprazole) .Marland Kitchen.. 1 By Mouth Daily 8)  Acetaminophen 325 Mg  Tabs (Acetaminophen) .Marland Kitchen.. 1 By Mouth As Needed 9)  Aspir-Low 81 Mg Tbec (Aspirin) .... Once Daily  Allergies (verified): No Known Drug Allergies    ICD Specifications Following MD:  Lewayne Bunting, MD     ICD Vendor:  St Jude     ICD Model Number:  NU2725-36     ICD Serial Number:  [id number] ICD DOI:  01/05/2010     ICD Implanting MD:  Lewayne Bunting, MD  Lead 1:    Location: RV     DOI: 01/05/2010     Model #: 7120     Serial #: [id number]     Status: active  Indications::  CM   ICD Follow Up Remote Check?  No Charge Time:  8.3 seconds     Battery Est. Longevity:  9.1 years Underlying rhythm:  SR ICD Dependent:  No       ICD Device Measurements Right Ventricle:  Amplitude: 12 mV, Impedance: 480 ohms, Threshold: 0.75 V at 0.4 msec Shock Impedance: 52 ohms   Episodes Coumadin:  No Shock:  0     ATP:  0     Nonsustained:  0     Ventricular Pacing:  <1%  Brady Parameters Mode VVI     Lower Rate Limit:  40      Tachy Zones VF:  222     VT:  200     VT1:  179     Next Cardiology Appt Due:  09/27/2010 Tech Comments:  No parameter changes.  Device function normal.  ROV 3 months [facility] clinic. Altha Harm, LPN  July 07, 2010 1:19 PM n

## 2010-12-27 NOTE — Cardiovascular Report (Signed)
Summary: Office Visit   Office Visit   Imported By: Roderic Ovens 07/28/2010 15:50:51  _____________________________________________________________________  External Attachment:    Type:   Image     Comment:   External Document

## 2010-12-27 NOTE — Medication Information (Signed)
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  Allergies: No Known Drug Allergies  Anticoagulation Management History:      Negative risk factors for bleeding include an age less than 7 years old.  The bleeding index is 'low risk'.  Negative CHADS2 values include Age > 45 years old.  The start date was 05/04/1999.  His last INR was 1.51.  Anticoagulation responsible provider: Dietrich Pates MD, Molly Maduro.  Exp: 04/12.    Anticoagulation Management Assessment/Plan:      The patient's current anticoagulation dose is Coumadin 5 mg tabs: take 7.5mg  x 6 days, then take 5mg  x 1 day.  The target INR is 2 - 3.  The next INR is due 03/09/2010.  Anticoagulation instructions were given to patient.  Results were reviewed/authorized by Inactive.         Prior Anticoagulation Instructions: INR 2.9 Continue coumadin 7.5mg  once daily except 10mg  on Mondays

## 2010-12-27 NOTE — Assessment & Plan Note (Signed)
Summary: [facility]  Medications Added NITROSTAT 0.4 MG SUBL (NITROGLYCERIN) 1 by mouth as needed      Allergies Added: NKDA  Visit Type:  Follow-up Primary Provider:  Joette Catching, MD   History of Present Illness: Mr Blizard returns after recent ICD implantation. He had a cardiac catheterization prior to this with results described below. He is being managed medically for his known coronary disease and cardiomyopathy. He has no ongoing acute symptoms. He denies any shortness of breath, PND or orthopnea. He denies any chest discomfort other than some mild incisional pain at the ICD site. He has had no palpitations, presyncope or syncope. He has had no fevers or chills.  Current Medications (verified): 1)  Klor-Con M10 10 Meq Cr-Tabs (Potassium Chloride Crys Cr) .... 2 By Mouth Once Daily 2)  Enalapril Maleate 20 Mg Tabs (Enalapril Maleate) .Marland Kitchen.. 1 Twice A  Day 3)  Bisoprolol-Hydrochlorothiazide 10-6.25 Mg Tabs (Bisoprolol-Hydrochlorothiazide) .Marland Kitchen.. 1 By Mouth Once Daily 4)  Lipitor 80 Mg Tabs (Atorvastatin Calcium) .Marland Kitchen.. 1 By Mouth Daily 5)  Niaspan 500 Mg Cr-Tabs (Niacin (Antihyperlipidemic)) .Marland Kitchen.. 1 By Mouth Once Daily 6)  Fish Oil 1000 Mg Caps (Omega-3 Fatty Acids) .... 3 Once Daily 7)  Coumadin 5 Mg Tabs (Warfarin Sodium) .... Take 7.5mg  X 6 Days, Then Take 5mg  X 1 Day 8)  Nitrostat 0.4 Mg Subl (Nitroglycerin) .Marland Kitchen.. 1 By Mouth As Needed  Allergies (verified): No Known Drug Allergies  Past History:  Past Medical History: Coronary Artery Disease s/p CABG (Cath 12/2009 Left main had 90% stenosis.   The LAD was occludedproximally.  There were mid and distal luminal irregularities.  The vessel was seen to fill via the LIMA graft.  There were 4 small diagonals.  They were patent.  There was a proximal first diagonal, which was large with 99% stenosis and filled via vein graft.  The circumflex had 99% proximal stenosis in the AV groove.  There was an obtuse marginal one, which was  moderate sized with 99% ostial stenosis. An obtuse marginal two, which was occluded at the ostium.  The right coronary artery was occluded distally.  There was severe diffuse mid disease.  The PDA was large and normal.Grafts:  Saphenous vein graft to the PDA had 30% stenosis at the anastomosis, but was otherwise widely patent.  Saphenous vein graft sequential to OM1 and OM2 was widely patent.  Saphenous vein graft to the diagonal was widely patent.  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 Jude)  Review of Systems       As stated in the HPI and negative for all other systems.   Vital Signs:  Patient profile:   55 year old male Height:      70 inches Weight:      174 pounds BMI:     25.06 Pulse rate:   77 / minute Resp:     16 per minute BP sitting:   112 / 72  (right arm)  Vitals Entered By: Marrion Coy, CNA (January 12, 2010 3:27 PM)  Physical Exam  General:  Well developed, well nourished, in no acute distress. Head:  normocephalic and atraumatic Eyes:  PERRLA/EOM intact; conjunctiva and lids normal. Mouth:  poor dentition, gums and palate normal. Oral mucosa  normal. Neck:  Neck supple, no JVD. No masses, thyromegaly or abnormal cervical nodes. Chest Wall:  well-healed ICD pocket without drainage or erythema Lungs:  Clear bilaterally to auscultation and percussion. Abdomen:  Bowel sounds positive; abdomen soft and non-tender without masses, organomegaly, or hernias noted. No hepatosplenomegaly. Msk:  Back normal, normal gait. Muscle strength and tone normal. Extremities:  No clubbing or cyanosis. Skin:  Intact without lesions or rashes. Cervical Nodes:  no significant adenopathy Axillary  Nodes:  no significant adenopathy Inguinal Nodes:  no significant adenopathy Psych:  Normal affect.   Detailed Cardiovascular Exam  Neck    Carotids: Carotids full and equal bilaterally without bruits.      Neck Veins: Normal, no JVD.    Heart    Inspection: no deformities or lifts noted.      Palpation: normal PMI with no thrills palpable.      Auscultation: regular rate and rhythm, S1, S2 without murmurs, rubs, gallops, or clicks.    Vascular    Abdominal Aorta: no palpable masses, pulsations, or audible bruits.      Femoral Pulses: normal femoral pulses bilaterally.      Pedal Pulses: normal pedal pulses bilaterally.      Radial Pulses: normal radial pulses bilaterally.      Peripheral Circulation: no clubbing, cyanosis, or edema noted with normal capillary refill.     EKG  Procedure date:  01/12/2010  Findings:      sinus rhythm, rate 67, axis within normal limits, intervals within normal limits, inferolateral T-wave inversions unchanged from previous   ICD Specifications Following MD:  Lewayne Bunting, MD     ICD Vendor:  [employer] Jude     ICD Model Number:  [id number]     ICD Serial Number:  [id number] ICD DOI:  01/05/2010     ICD Implanting MD:  Lewayne Bunting, MD  Lead 1:    Location: RV     DOI: 01/05/2010     Model #: 7120     Serial #: [id number]     Status: active  Indications::  CM   Impression & Recommendations:  Problem # 1:  CARDIOMYOPATHY, ISCHEMIC (ICD-414.8) The patient is doing well and has no overt symptoms. He is on a reasonable medical regimen. He will continue with this and I will make no change. Orders: EKG w/ Interpretation (93000)  Problem # 2:  OTHER SPEC FORMS CHRONIC ISCHEMIC HEART DISEASE (ICD-414.8) The patient has no new symptoms. The catheter report is as described. He will continue with risk reduction.  Problem # 3:  IMPLANTATION OF DEFIBRILLATOR, HX OF (ICD-V45.02) The patient is scheduled for followup of his device. No change in therapy is  indicated.  Patient Instructions: 1)  Your physician recommends that you schedule a follow-up appointment in: 3 months 2)  Your physician recommends that you continue on your current medications as directed. Please refer to the Current Medication list given to you today.

## 2010-12-27 NOTE — Progress Notes (Signed)
Summary: cath instructions  pfh,rn   Phone Note Outgoing Call   Call placed by: Charolotte Capuchin, RN,  December 03, 2009 11:15 AM Call placed to: Patient Details for Reason: cath instructions Summary of Call: LM to pt to call back for cath instructions.  Scheduled in [facility] for 12/10/2009 to be there 7:30 for and 8:30 case with [facility], code for parking garage is 0090, pt needs labs!! Initial call taken by: Charolotte Capuchin, RN,  December 03, 2009 11:18 AM  Follow-up for Phone Call        Spoke with pt and gave him instructions for cath on Friday 12/10/2009.  Order faxed to Dr Deitra Mayo office for blood work on Monday.  Pt states understanding of instructions and has the code to get into the parking garage. Follow-up by: Charolotte Capuchin, RN,  December 03, 2009 11:30 AM

## 2010-12-27 NOTE — Medication Information (Signed)
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 no    Recent/future dental: no  Any missed doses?: no       Is patient compliant with meds? yes       Allergies: No Known Drug Allergies  Anticoagulation Management History:      The patient is taking warfarin and comes in today for a routine follow up visit.  Negative risk factors for bleeding include an age less than 15 years old.  The bleeding index is 'low risk'.  Negative CHADS2 values include Age > 44 years old.  The start date was 05/04/1999.  His last INR was 1.51.  Anticoagulation responsible provider: Dietrich Pates MD, Molly Maduro.  INR POC: 2.9.  Cuvette Lot#: [id number].  Exp: 04/12.    Anticoagulation Management Assessment/Plan:      The patient's current anticoagulation dose is Coumadin 5 mg tabs: take 7.5mg  x 6 days, then take 5mg  x 1 day.  The target INR is 2 - 3.  The next INR is due 03/09/2010.  Anticoagulation instructions were given to patient.  Results were reviewed/authorized by Vashti Hey, RN.  He was notified by Vashti Hey RN.         Prior Anticoagulation Instructions: INR 2.6 TODAY NO CHANGE IN REGIMEN CONTINUE 2 TABLETS ON MONDAY AND 1.5 ALL OTHER DAYS  Current Anticoagulation Instructions: INR 2.9 Continue coumadin 7.5mg  once daily except 10mg  on Mondays

## 2010-12-27 NOTE — Medication Information (Signed)
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 no    Recent/future dental: no  Any missed doses?: no       Is patient compliant with meds? yes       Current Medications (verified): 1)  Klor-Con M10 10 Meq Cr-Tabs (Potassium Chloride Crys Cr) .... 2 By Mouth Once Daily 2)  Enalapril Maleate 20 Mg Tabs (Enalapril Maleate) .Marland Kitchen.. 1 Twice A  Day 3)  Bisoprolol-Hydrochlorothiazide 10-6.25 Mg Tabs (Bisoprolol-Hydrochlorothiazide) .Marland Kitchen.. 1 By Mouth Once Daily 4)  Lipitor 80 Mg Tabs (Atorvastatin Calcium) .Marland Kitchen.. 1 By Mouth Daily 5)  Niaspan 500 Mg Cr-Tabs (Niacin (Antihyperlipidemic)) .Marland Kitchen.. 1 By Mouth Once Daily 6)  Fish Oil 1000 Mg Caps (Omega-3 Fatty Acids) .... 3 Once Daily 7)  Coumadin 5 Mg Tabs (Warfarin Sodium) .... Take 7.5mg  X 6 Days, Then Take 5mg  X 1 Day 8)  Nitrostat 0.4 Mg Subl (Nitroglycerin) .Marland Kitchen.. 1 By Mouth As Needed  Allergies (verified): No Known Drug Allergies  Anticoagulation Management History:      The patient is taking warfarin and comes in today for a routine follow up visit.  Negative risk factors for bleeding include an age less than 79 years old.  The bleeding index is 'low risk'.  Negative CHADS2 values include Age > 47 years old.  The start date was 05/04/1999.  His last INR was 1.51.  Anticoagulation responsible provider: Dietrich Pates MD, Molly Maduro.  INR POC: 2.6.  Cuvette Lot#: [id number].  Exp: 04/12.    Anticoagulation Management Assessment/Plan:      The patient's current anticoagulation dose is Coumadin 5 mg tabs: take 7.5mg  x 6 days, then take 5mg  x  1 day.  The target INR is 2 - 3.  The next INR is due 02/10/2010.  Anticoagulation instructions were given to patient.  Results were reviewed/authorized by Teressa Lower, RN.  He was notified by Teressa Lower RN.         Prior Anticoagulation Instructions: INR 1.9 take couamdin 3 tablets tonight then resume 1 1/2 tablets once daily except 2 tablets on Mondays  Current Anticoagulation Instructions: INR 2.6 TODAY NO CHANGE IN REGIMEN CONTINUE 2 TABLETS ON MONDAY AND 1.5 ALL OTHER DAYS

## 2010-12-27 NOTE — Miscellaneous (Signed)
Summary: Device preload  Clinical Lists Changes  Observations: Added new observation of ICD INDICATN: CM (01/06/2010 10:48) Added new observation of ICDLEADSTAT1: active (01/06/2010 10:48) Added new observation of ICDLEADSER1: [id number] (01/06/2010 10:48) Added new observation of ICDLEADMOD1: 7120  (01/06/2010 10:48) Added new observation of ICDLEADLOC1: RV  (01/06/2010 10:48) Added new observation of ICD IMP MD: Lewayne Bunting, MD  (01/06/2010 10:48) Added new observation of ICDLEADDOI1: 01/05/2010  (01/06/2010 10:48) Added new observation of ICD IMPL DTE: 01/05/2010  (01/06/2010 10:48) Added new observation of ICD SERL#: [id number]  (01/06/2010 10:48) Added new observation of ICD MODL#: [id number]  (01/06/2010 10:48) Added new observation of ICDMANUFACTR: [employer] Jude  (01/06/2010 10:48) Added new observation of ICD MD: Lewayne Bunting, MD  (01/06/2010 10:48)       ICD Specifications Following MD:  Lewayne Bunting, MD     ICD Vendor:  [employer] Jude     ICD Model Number:  [id number]     ICD Serial Number:  [id number] ICD DOI:  01/05/2010     ICD Implanting MD:  Lewayne Bunting, MD  Lead 1:    Location: RV     DOI: 01/05/2010     Model #: 7120     Serial #: [id number]     Status: active  Indications::  CM

## 2010-12-27 NOTE — Progress Notes (Signed)
   Request for Records from [facility]DS forwarded to [facility] for processing. Joseph [name]  December 06, 2009 9:01 AM    Appended Document:  2nd request from [facility] forwarded to [facility]   Appended Document:  3rd request for records recieved from [facility]DS forwarded to [facility]

## 2010-12-27 NOTE — Assessment & Plan Note (Signed)
Summary: possible icd per hochrein/will see Joseph [name] in [place] office  Medications Added BISOPROLOL-HYDROCHLOROTHIAZIDE 10-6.25 MG TABS (BISOPROLOL-HYDROCHLOROTHIAZIDE) 1 by mouth once daily      Allergies Added: NKDA  Visit Type:  Follow-up Primary Provider:  Joette Catching, MD   History of Present Illness: [name] is referred back today by Dr. Antoine Poche for consideration for prophylactic ICD implant.  The patient was initially referred to see me in 2003.  He has a longstanding h/o an ICM and is s/p MI many yrs ago.  He has recently undergone catheterization which demonstrated that his EF is 25% (20% by 2D echo) and his grafts were patent with severe 3 vessel native disease.  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  No syncope.  No c/p or peripheral edema.  Current Medications (verified): 1)  Klor-Con M10 10 Meq Cr-Tabs (Potassium Chloride Crys Cr) .... 2 By Mouth Once Daily 2)  Enalapril Maleate 20 Mg Tabs (Enalapril Maleate) .Marland Kitchen.. 1 Twice A  Day 3)  Bisoprolol-Hydrochlorothiazide 10-6.25 Mg Tabs (Bisoprolol-Hydrochlorothiazide) .Marland Kitchen.. 1 By Mouth Once Daily 4)  Lipitor 80 Mg Tabs (Atorvastatin Calcium) .Marland Kitchen.. 1 By Mouth Daily 5)  Niaspan 500 Mg Cr-Tabs (Niacin (Antihyperlipidemic)) .Marland Kitchen.. 1 By Mouth Once Daily 6)  Fish Oil 1000 Mg Caps (Omega-3 Fatty Acids) .... 3 Once Daily 7)  Coumadin 5 Mg Tabs (Warfarin Sodium) .... Take 7.5mg  X 6 Days, Then Take 5mg  X 1 Day  Allergies (verified): No Known Drug Allergies  Past History:  Past Medical History: Last updated: 06/22/2009 Coronary Artery Disease s/p CABG H/O left ventricular dysfunction and apical thrombus on chronic Coumadin since 2000 H/O small bowel obstruction, resolved without surgery in 2007, ileus in 2006 required hospitalization  Hyperlipidemia    Past Surgical History: Last updated: 06/22/2009 CABG, 6 vessels, 2000  Review of Systems  The patient denies chest pain, syncope, dyspnea on exertion, and peripheral edema.    Vital  Signs:  Patient profile:   55 year old male Weight:      184 pounds Pulse rate:   80 / minute BP sitting:   119 / 82  (right arm)  Vitals Entered By: Dreama Saa, CNA (December 20, 2009 10:08 AM)  Physical Exam  General:  Well developed, well nourished, in no acute distress. Head:  normocephalic and atraumatic Eyes:  PERRLA/EOM intact; conjunctiva and lids normal. Mouth:  poor dentition, gums and palate normal. Oral mucosa normal. Neck:  Neck supple, no JVD. No masses, thyromegaly or abnormal cervical nodes. Lungs:  Clear bilaterally to auscultation with out wheezes, rales, or rhonchi. Heart:  Regular rate and rhythm; no murmurs, rubs,  or bruits. Abdomen:  Bowel sounds positive; abdomen soft and non-tender without masses, organomegaly, or hernias noted. No hepatosplenomegaly. Msk:  Back normal, normal gait. Muscle strength and tone normal. Pulses:  diminished right dorsalis pedis pulse, diminished right posterior tibial pulse, diminished left dorsalis pedis pulse, and diminished left posterior tibial pulse.   Extremities:  bilateral dependent rubor with mild edema Neurologic:  Alert and oriented x 3.   EKG  Procedure date:  11/03/2009  Findings:      Normal sinus rhythm with rate of:  70.  Comments:      Evidence of remote septal MI.    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  He has an EF of 25% by cath and echo. I have recommended proceeding with prophylactic ICD implant.  The risks/benefits/goals and  expectations of the procedure have been discussed with the patient and he wishes to proceed.  Problem # 2:  COUMADIN THERAPY (ICD-V58.61) I will ask him to hold his coumadin for two days prior to device implant.  Problem # 3:  HYPERLIPIDEMIA (ICD-272.4) A low fat diet is recommended. His updated medication list for this problem includes:    Lipitor 80 Mg Tabs (Atorvastatin  calcium) .Marland Kitchen... 1 by mouth daily    Niaspan 500 Mg Cr-tabs (Niacin (antihyperlipidemic)) .Marland Kitchen... 1 by mouth once daily  Other Orders: T-Chest x-ray, 2 views (71020) Bi-V ICD (Bi-V ICD) Future Orders: T-PTT ([id number]) ... 12/31/2009 T-CBC w/Diff ([id number]) ... 12/31/2009 T-Basic Metabolic Panel [phone]) ... 12/31/2009 T-Protime, Auto ([id number]) ... 12/31/2009  Patient Instructions: 1)  Your physician recommends that you schedule a follow-up appointment in: Post ICD implantation 2)  Your physician recommends that you return for lab work [id number] 3)  Your physician has recommended you make the following change in your medication: Stop taking Coumadin 2 days prior to ICD implantation 4)  Your physician has recommended that you have a defibrillator inserted.  An implantable cardioverter defibrillator (ICD) is a small device that is placed in your chest or, in rare cases, your abdomen. This device uses electrical pulses or shocks to help control life-threatening, irregular heartbeats that could lead the heart to suddenly stop beating (sudden cardiac arrest). Leads are attached to the ICD that goes into your heart. This is done in the hospital and usually requires an overnight stay. Please see the instruction sheet given to you today for more information. 5)  A chest x-ray takes a picture of the organs and structures inside the chest, including the heart, lungs, and blood vessels. This test can show several things, including, whether the heart is enlarged; whether fluid is building up in the lungs; and whether pacemaker / defibrillator leads are still in place.

## 2010-12-27 NOTE — Assessment & Plan Note (Signed)
Summary: eph post pacemaker placement      Allergies Added: NKDA  Visit Type:  Follow-up Primary Provider:  Joette Catching, MD  CC:  no cardiology complaints.  History of Present Illness: Mr. Heindl returns today for ICD followup.  He is a pleasant middle aged man with a h/o ICM s/p CABG, CHF, and PAF.  He is s/p ICD implant and several weeks after his procedure, developed a Mallory-Weiss tear and was hospitalized for anemia and GI bleeding.  He subsequently developed MRSA bacteremia secondary to an infected IV line and required surgical repair with I&D of the basilic vein.  He returns today for followup.  He denies fever, chills, night sweats or CHF symptoms. He is no longer felt to be a coumadin candidate.   Current Medications (verified): 1)  Klor-Con M10 10 Meq Cr-Tabs (Potassium Chloride Crys Cr) .... 2 By Mouth Once Daily 2)  Enalapril Maleate 20 Mg Tabs (Enalapril Maleate) .Marland Kitchen.. 1 By Mouth Daily 3)  Bisoprolol-Hydrochlorothiazide 10-6.25 Mg Tabs (Bisoprolol-Hydrochlorothiazide) .Marland Kitchen.. 1 Daily 4)  Lipitor 80 Mg Tabs (Atorvastatin Calcium) .Marland Kitchen.. 1 By Mouth Daily 5)  Niaspan 500 Mg Cr-Tabs (Niacin (Antihyperlipidemic)) .Marland Kitchen.. 1 By Mouth Once Daily 6)  Nitrostat 0.4 Mg Subl (Nitroglycerin) .Marland Kitchen.. 1 By Mouth As Needed 7)  Vancomycin Hcl in Dextrose 500 Mg/144ml Soln (Vancomycin Hcl in Dextrose) .... As Directed 8)  Furosemide 20 Mg Tabs (Furosemide) .Marland Kitchen.. 1 By Mouth Two Times A Day 9)  Rifampin 300 Mg Caps (Rifampin) .Marland Kitchen.. 1 By Mouth Two Times A Day 10)  Omega-3 350 Mg Caps (Omega-3 Fatty Acids) .Marland Kitchen.. 1 By Mouth Daily 11)  Omeprazole 20 Mg Cpdr (Omeprazole) .Marland Kitchen.. 1 By Mouth Daily 12)  Acetaminophen 325 Mg  Tabs (Acetaminophen) .Marland Kitchen.. 1 By Mouth As Needed  Allergies (verified): No Known Drug Allergies  Past History:  Past Medical History: Last updated: 03/30/2010 Coronary Artery Disease s/p CABG (Cath 12/2009 Left main had 90% stenosis.   The LAD was occludedproximally.  There were mid and  distal luminal irregularities.  The vessel was seen to fill via the LIMA graft.  There were 4 small diagonals.  They were patent.  There was a proximal first diagonal, which was large with 99% stenosis and filled via vein graft.  The circumflex had 99% proximal stenosis in the AV groove.  There was an obtuse marginal one, which was moderate sized with 99% ostial stenosis. An obtuse marginal two, which was occluded at the ostium.  The right coronary artery was occluded distally.  There was severe diffuse mid disease.  The PDA was large and normal.Grafts:  Saphenous vein graft to the PDA had 30% stenosis at the anastomosis, but was otherwise widely patent.  Saphenous vein graft sequential to OM1 and OM2 was widely patent.  Saphenous vein graft to the diagonal was widely patent.  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) Basilic vein left arm resection  Review of Systems  The patient denies chest pain, syncope, dyspnea on exertion, and peripheral edema.    Vital Signs:  Patient profile:   55 year old male Weight:      161 pounds BMI:     23.18  Pulse rate:   81 / minute BP sitting:   115 / 77  (right arm)  Vitals Entered By: Dreama Saa, CNA (Apr 04, 2010 2:10 PM)  Physical Exam  General:  Well developed, well nourished, in no acute distress. Head:  normocephalic and atraumatic Eyes:  PERRLA/EOM intact; conjunctiva and lids normal. Mouth:  poor dentition, gums and palate normal. Oral mucosa normal. Neck:  Neck supple, no JVD. No masses, thyromegaly or abnormal cervical nodes. Chest Wall:   well-healed ICD pocket without drainage or erythema Lungs:  Clear bilaterally to auscultation with no wheezes, rales, or rhonchi. Heart:  Regular rate and rhythm; no murmurs, rubs,  or bruits. Abdomen:  Bowel sounds positive; abdomen soft and non-tender without masses, organomegaly, or hernias noted. No hepatosplenomegaly. Msk:  Back normal, normal gait. Muscle strength and tone normal. Pulses:  normal pedal pulses bilaterally.   Extremities:  left arm is bandaged and mild bilateral lower extremity ankle edema is present. Neurologic:  Alert and oriented x 3.    ICD Specifications Following MD:  Lewayne Bunting, MD     ICD Vendor:  [facility] Jude     ICD Model Number:  [id number]     ICD Serial Number:  [id number] ICD DOI:  01/05/2010     ICD Implanting MD:  Lewayne Bunting, MD  Lead 1:    Location: RV     DOI: 01/05/2010     Model #: 7120     Serial #: [id number]     Status: active  Indications::  CM   ICD Follow Up Remote Check?  No Charge Time:  7.8 seconds     Battery Est. Longevity:  8.8 years Underlying rhythm:  SR ICD Dependent:  No       ICD Device Measurements Right Ventricle:  Amplitude: 11.9 mV, Impedance: 480 ohms, Threshold: 0.75 V at 0.4 msec  Episodes Coumadin:  No Shock:  0     ATP:  0     Nonsustained:  0      Brady Parameters Mode VVI     Lower Rate Limit:  40      Tachy Zones VF:  240     VT:  200     VT1:  179     Next Cardiology Appt Due:  06/27/2010 Tech Comments:  No parameter changes.  Device function normal.  ROV 3 months RDS clinic. Altha Harm, LPN  Apr 04, 864 2:26 PM  MD Comments:  Agree with above.  Impression & Recommendations:  Problem # 1:  IMPLANTATION OF DEFIBRILLATOR, HX OF (ICD-V45.02) His device is working normally.  No evidence of pocket infection.  With MRSA bacteremia originating from the ipsilateral upper extremity venous insertion site, he is still at risk for ICD system infection.  He will continue his current meds including vancomycin and I will  see him back in several months, and I have instructed both the patient and his wife go urgently to the hospital for any fever/chills/pocket swelling.  Problem # 2:  CARDIOMYOPATHY, ISCHEMIC (ICD-414.8) He denies c/p or anginal symptoms. Continue current meds. His updated medication list for this problem includes:    Enalapril Maleate 20 Mg Tabs (Enalapril maleate) .Marland Kitchen... 1 by mouth daily    Bisoprolol-hydrochlorothiazide 10-6.25 Mg Tabs (Bisoprolol-hydrochlorothiazide) .Marland Kitchen... 1 daily    Nitrostat 0.4 Mg Subl (Nitroglycerin) .Marland Kitchen... 1 by mouth as needed    Furosemide 20 Mg Tabs (Furosemide) .Marland Kitchen... 1 by mouth two times a day  Problem # 3:  COUMADIN THERAPY (ICD-V58.61) With his Mallory-Weiss tear, he is not currently a coumadin candidate.

## 2010-12-27 NOTE — Assessment & Plan Note (Signed)
Summary: [facility]      Allergies Added: NKDA   Visit Type:  Follow-up Primary Provider:  Dr. Ardeen Garland  CC:  Cardiomyopathy and CAD.  History of Present Illness: The patient presnets for followup of his cardiomyopathy. Since I last saw him he has finished his antibiotics and had followup blood cultures. The results were back last week and one bottle out of 2 was positive for staph aureus. However, he has had no fevers or chills and overall feels very well. We therefore, after discussion with Dr. Ladona Ridgel, sent him for 2 more blood cultures. Results are pending with the final expected in the next one to 2 days but there have been no positive results yet.  Overall he feels well. He has had no chest pressure, neck or arm discomfort. He has had no palpitations, presyncope or syncope. He denies any PND or orthopnea.  Current Medications (verified): 1)  Enalapril Maleate 20 Mg Tabs (Enalapril Maleate) .Marland Kitchen.. 1 By Mouth Daily 2)  Bisoprolol-Hydrochlorothiazide 10-6.25 Mg Tabs (Bisoprolol-Hydrochlorothiazide) .Marland Kitchen.. 1 Daily 3)  Lipitor 80 Mg Tabs (Atorvastatin Calcium) .Marland Kitchen.. 1 By Mouth Daily 4)  Niaspan 500 Mg Cr-Tabs (Niacin (Antihyperlipidemic)) .Marland Kitchen.. 1 By Mouth Once Daily 5)  Nitrostat 0.4 Mg Subl (Nitroglycerin) .Marland Kitchen.. 1 By Mouth As Needed 6)  Omega-3 350 Mg Caps (Omega-3 Fatty Acids) .Marland Kitchen.. 1 By Mouth Daily 7)  Omeprazole 20 Mg Cpdr (Omeprazole) .Marland Kitchen.. 1 By Mouth Daily 8)  Acetaminophen 325 Mg  Tabs (Acetaminophen) .Marland Kitchen.. 1 By Mouth As Needed 9)  Aspir-Low 81 Mg Tbec (Aspirin) .... Once Daily  Allergies (verified): No Known Drug Allergies  Past History:  Past Medical History: Left ventricular dysfunction and apical thrombus CAD  CHF EF 20% GI bleed with Mallory-Weiss tear EGD 02/2010 Small bowel obstruction, resolved without surgery in 2007 ileus in 2006 required hospitalization  Hyperlipidemia MRSA bacteremia with basilic vein thrombophlebitis status post resection Hx apical  thrombus Coronary Artery Disease s/p CABG (Cath 12/2009 Left main had 90% stenosis.   The LAD was occludedproximally.  There were mid and distal luminal irregularities.  The vessel was seen to fill via the LIMA graft.  There were 4 small diagonals.  They were patent.  There was a proximal first diagonal, which was large with 99% stenosis and filled via vein graft.  The circumflex had 99% proximal stenosis in the AV groove.  There was an obtuse marginal one, which was moderate sized with 99% ostial stenosis. An obtuse marginal two, which was occluded at the ostium.  The right coronary artery was occluded distally.  There was severe diffuse mid disease.  The PDA was large and normal.Grafts:  Saphenous vein graft to the PDA had 30% stenosis at the anastomosis, but was otherwise widely patent.  Saphenous vein graft sequential to OM1 and OM2 was widely patent.  Saphenous vein graft to the diagonal was widely patent.  A LIMA to the LAD was widely patent.)    Past Surgical History: Reviewed history from 03/30/2010 and no changes required. CABG, 6 vessels, 2000 ICD insertion (01/05/10, single chamber [employer] Jude) Basilic vein left arm resection  Review of Systems       As stated in the HPI and negative for all other systems.   Vital Signs:  Patient profile:   55 year old male Height:      70 inches Weight:      168 pounds BMI:     24.19 Pulse rate:   73 / minute Resp:  16 per minute BP sitting:   120 / 68  (right arm)  Vitals Entered By: Marrion Coy, CNA (May 04, 2010 11:44 AM)  Physical Exam  General:  Well developed, well nourished, no acute distress. Head:  Normocephalic and atraumatic. Neck:  Supple; no masses or thyromegaly. Chest Wall:  Well-healed defibrillator pocket Lungs:  Clear throughout to auscultation. Abdomen:  Soft, nontender and nondistended. No masses, hepatosplenomegaly or hernias noted. Normal bowel sounds.without guarding and without rebound.   Msk:   Symmetrical with no gross deformities. Normal posture. Extremities:  Left hand swelling Neurologic:  Alert and oriented x 3. Skin:  Intact without lesions or rashes. Psych:  Normal affect.   Detailed Cardiovascular Exam  Neck    Carotids: Carotids full and equal bilaterally without bruits.      Neck Veins: Normal, no JVD.    Heart    Inspection: no deformities or lifts noted.      Auscultation: regular rate and rhythm, S1, S2 3/6 apical systolic murmur radiating to the axilla, no diastolic murmurs  Vascular    Abdominal Aorta: no palpable masses, pulsations, or audible bruits.      Femoral Pulses: normal femoral pulses bilaterally.      Pedal Pulses: Normal pulses noted.    Radial Pulses: normal radial pulses bilaterally.      ICD Specifications Following MD:  Lewayne Bunting, MD     ICD Vendor:  [facility] Jude     ICD Model Number:  IH4742-59     ICD Serial Number:  [id number] ICD DOI:  01/05/2010     ICD Implanting MD:  Lewayne Bunting, MD  Lead 1:    Location: RV     DOI: 01/05/2010     Model #: 7120     Serial #: [id number]     Status: active  Indications::  CM   ICD Follow Up ICD Dependent:  No      Episodes Coumadin:  No  Brady Parameters Mode VVI     Lower Rate Limit:  40      Tachy Zones VF:  240     VT:  200     VT1:  179     Impression & Recommendations:  Problem # 1:  BACTEREMIA (ICD-790.7) We will followup with the lab today to make sure that the surveillance cultures were negative.  Problem # 2:  CARDIOMYOPATHY, ISCHEMIC (ICD-414.8) Since I saw him his Lasix and potassium were discontinued he remains euvolemic. I made no change to his regimen.  Problem # 3:  OTHER SPEC FORMS CHRONIC ISCHEMIC HEART DISEASE (ICD-414.8) The patient has had no new symptoms. No further cardiovascular testing is suggested. He will continue with risk reduction.

## 2010-12-27 NOTE — Progress Notes (Signed)
Summary: picc line finished / antiobotic   Phone Note Call from Patient Call back at Home Phone [phone]   Caller: Daughter- terry  Reason for Call: Talk to Nurse Summary of Call:  per pt daughter called was told to call back with info . Picc line is out as of  5/16. antibiotic is finished.  Initial call taken by: Lorne Skeens,  Apr 19, 2010 12:32 PM  Follow-up for Phone Call        I spoke with the pt and explained to him that Dr Antoine Poche wanted him to have blood cultures drawn once his IV antibiotics were completed (per 03/30/10 note).  The pt said he had blood drawn at Dr Joyce Copa office today.  I asked the pt if he had blood drawn from 2 different locations and he did not.  I told the pt that he most likely did not have blood cultures drawn by Dr Joyce Copa office.  The pt said he would call Dr Joyce Copa office to see what was drawn and will call our office back.  The pt needs blood cultures x2. Follow-up by: Julieta Gutting, RN, BSN,  Apr 19, 2010 1:00 PM  Additional Follow-up for Phone Call Additional follow up Details #1::        The pt called back and said Dr Joyce Copa office checked a CBC and Lipid today.  The pt said our office could fax an order to [phone] Attn: Elenor Quinones and he can have cultures drawn tomorrow.  Order faxed for blood cultures x2.  Julieta Gutting, RN, BSN  Apr 19, 2010 2:35 PM

## 2010-12-27 NOTE — Medication Information (Signed)
Summary: protime/tg  Anticoagulant Therapy  Managed by: Vashti Hey, RN PCP: Joette Catching, MD Supervising MD: Dietrich Pates MD, Molly Maduro Indication 1: Left Ventricular Dysfuncton (ICD-428.10) Lab Used: [facility] [place] Site: [place] INR POC 1.9  Dietary changes: no    Health status changes: no    Bleeding/hemorrhagic complications: no    Recent/future hospitalizations: no    Any changes in medication regimen? no    Recent/future dental: no  Any missed doses?: yes     Details: held 1 night for cardiac cath  Is patient compliant with meds? yes       Allergies: No Known Drug Allergies  Anticoagulation Management History:      The patient is taking warfarin and comes in today for a routine follow up visit.  Negative risk factors for bleeding include an age less than 43 years old.  The bleeding index is 'low risk'.  Negative CHADS2 values include Age > 76 years old.  The start date was 05/04/1999.  Anticoagulation responsible provider: Dietrich Pates MD, Molly Maduro.  INR POC: 1.9.  Cuvette Lot#: [id number].  Exp: 10/11.    Anticoagulation Management Assessment/Plan:      The patient's current anticoagulation dose is Coumadin 5 mg tabs: take 7.5mg  x 6 days, then take 5mg  x 1 day.  The target INR is 2 - 3.  The next INR is due 01/17/2010.  Anticoagulation instructions were given to patient.  Results were reviewed/authorized by Vashti Hey, RN.  He was notified by Vashti Hey RN.         Prior Anticoagulation Instructions: INR 2.1 Continue coumadin 7.5mg  once daily except 10mg  on Mondays  Current Anticoagulation Instructions: INR 1.9 take couamdin 3 tablets tonight then resume 1 1/2 tablets once daily except 2 tablets on Mondays  Appended Document: protime/tg Saw Dr Ladona Ridgel today.  Scheduled for defib placement. Pt to take 12.5mg  tonight instead of 15mg  tonight as previously ordered.

## 2010-12-27 NOTE — Medication Information (Signed)
Summary: Physician Orders  Physician Orders   Imported By: Roderic Ovens 05/06/2010 15:31:25  _____________________________________________________________________  External Attachment:    Type:   Image     Comment:   External Document

## 2010-12-27 NOTE — Procedures (Signed)
Summary: icd check/st. jude      Allergies Added: NKDA  Current Medications (verified): 1)  Enalapril Maleate 20 Mg Tabs (Enalapril Maleate) .Marland Kitchen.. 1 By Mouth Daily 2)  Bisoprolol-Hydrochlorothiazide 10-6.25 Mg Tabs (Bisoprolol-Hydrochlorothiazide) .Marland Kitchen.. 1 Daily 3)  Lipitor 80 Mg Tabs (Atorvastatin Calcium) .Marland Kitchen.. 1 By Mouth Daily 4)  Niaspan 500 Mg Cr-Tabs (Niacin (Antihyperlipidemic)) .Marland Kitchen.. 1 By Mouth Once Daily 5)  Nitrostat 0.4 Mg Subl (Nitroglycerin) .Marland Kitchen.. 1 By Mouth As Needed 6)  Omega-3 350 Mg Caps (Omega-3 Fatty Acids) .Marland Kitchen.. 1 By Mouth Daily 7)  Omeprazole 20 Mg Cpdr (Omeprazole) .Marland Kitchen.. 1 By Mouth Daily 8)  Acetaminophen 325 Mg  Tabs (Acetaminophen) .Marland Kitchen.. 1 By Mouth As Needed 9)  Aspir-Low 81 Mg Tbec (Aspirin) .... Once Daily  Allergies (verified): No Known Drug Allergies    ICD Specifications Following MD:  Lewayne Bunting, MD     ICD Vendor:  [employer] Jude     ICD Model Number:  [id number]     ICD Serial Number:  [id number] ICD DOI:  01/05/2010     ICD Implanting MD:  Lewayne Bunting, MD  Lead 1:    Location: RV     DOI: 01/05/2010     Model #: 7120     Serial #: [id number]     Status: active  Indications::  CM   ICD Follow Up Remote Check?  No Charge Time:  8.5  seconds     Battery Est. Longevity:  8.9 years Underlying rhythm:  SR ICD Dependent:  No       ICD Device Measurements Right Ventricle:  Amplitude: 11.9 mV, Impedance: 430 ohms, Threshold: 0.75 V at 0.4 msec Shock Impedance: 40 ohms   Episodes Coumadin:  No Shock:  0     ATP:  0     Nonsustained:  0     Ventricular Pacing:  <0.1%  Brady Parameters Mode VVI     Lower Rate Limit:  40      Tachy Zones VF:  222     VT:  200     VT1:  179     Next Cardiology Appt Due:  12/28/2010 Tech Comments:  No parameter changes.  Device function normal.  ROV 3 months RDS. clinic. Altha Harm, LPN  October 11, 2010 11:28 AM

## 2010-12-27 NOTE — Letter (Signed)
Summary: Midwife   [employer] Authorization   Imported By: Roderic Ovens 03/09/2010 14:04:34  _____________________________________________________________________  External Attachment:    Type:   Image     Comment:   External Document

## 2010-12-27 NOTE — Letter (Signed)
Summary: Patient Notice, Colon Biopsy Results  [facility]  [address]   [place], [place] [address]   Phone: [phone]  Fax: [phone]       May 14, 2010   Joseph [name] [address] [place], [place]  [address] [birth year]    Dear [name],  I am pleased to inform you that the biopsies taken during your recent colonoscopy did not show any evidence of cancer upon pathologic examination.  Additional information/recommendations:  No further action is needed at this time.  Please follow-up with your primary care physician for your other healthcare needs.  You should have a repeat colonoscopy examination  in 7 years.  Please call us if you are having persistent problems or have questions about your condition that have not been fully answered at this time.  Sincerely,    R. Roetta Sessions MD, FACP [facility] Ph: [phone]    Fax: [phone]   Appended Document: Patient Notice, Colon Biopsy Results letter mailed to pt  Appended Document: Patient Notice, Colon Biopsy Results reminder in computer

## 2010-12-27 NOTE — Miscellaneous (Signed)
Summary: labs bmp,pt,ptt,12/31/2009  Clinical Lists Changes  Observations: Added new observation of CALCIUM: 9.2 mg/dL (29/56/2130 8:65) Added new observation of SGPT (ALT): >60 units/L (01/01/2010 9:22) Added new observation of SGOT (AST): >60 units/L (01/01/2010 9:22) Added new observation of CREATININE: 1.08 mg/dL (78/46/9629 5:28) Added new observation of BUN: 12 mg/dL (41/32/4401 0:27) Added new observation of BG RANDOM: 96 mg/dL (25/36/6440 3:47) Added new observation of CO2 PLSM/SER: 26 meq/L (01/01/2010 9:22) Added new observation of CL SERUM: 106 meq/L (01/01/2010 9:22) Added new observation of K SERUM: 4.6 meq/L (01/01/2010 9:22) Added new observation of NA: 141 meq/L (01/01/2010 9:22) Added new observation of INR: 1.51  (01/01/2010 9:22) Added new observation of PT PATIENT: 18.1 s (01/01/2010 9:22) Added new observation of PTT PATIENT: 32 s (01/01/2010 9:22)

## 2010-12-27 NOTE — Letter (Signed)
Summary: Implantable Device Instructions  [facility]  [address], [place] [address]   Phone: [phone]  Fax: [phone]      Implantable Device Instructions  You are scheduled for:  _____ Permanent Transvenous Pacemaker __X___ Implantable Cardioverter Defibrillator _____ Implantable Loop Recorder _____ Generator Change  on _2-09-2011____ with Dr. _Taylor____.  1.  Please arrive at the Short Stay Center at [facility] at __9:30am___ on the day of your procedure.  2.  Do not eat or drink the night before your procedure.  3.  Complete lab work on _1-24-11____.    4.  Do NOT take these medications for __2__ days prior to your procedure: Warfarin.  Take your last dose of Coumadin on __2-7-11______.  5.  Plan for an overnight stay.  Bring your insurance cards and a list of your medications.  6.  Wash your chest and neck with antibacterial soap (any brand) the evening before and the morning of your procedure.  Rinse well.  7.  Education material received:     Pacemaker _____           ICD _____           Arrhythmia _____  *If you have ANY questions after you get home, please call the office [phone].  *Every attempt is made to prevent procedures from being rescheduled.  Due to the nauture of Electrophysiology, rescheduling can happen.  The physician is always aware and directs the staff when this occurs.

## 2010-12-27 NOTE — Miscellaneous (Signed)
**Note De-identified Joseph [name] Obfuscation** Summary: Orders Update  Clinical Lists Changes  Orders: Added new Test order of T-Culture, Blood Routine ([id number]) - Signed 

## 2010-12-27 NOTE — Assessment & Plan Note (Signed)
Summary: Joseph [name]  Medications Added ENALAPRIL MALEATE 20 MG TABS (ENALAPRIL MALEATE) 1 by mouth daily BISOPROLOL-HYDROCHLOROTHIAZIDE 10-6.25 MG TABS (BISOPROLOL-HYDROCHLOROTHIAZIDE) 1 daily BISOPROLOL-HYDROCHLOROTHIAZIDE 10-6.25 MG TABS (BISOPROLOL-HYDROCHLOROTHIAZIDE) 1/2  by mouth daily VANCOMYCIN HCL IN DEXTROSE 500 MG/100ML SOLN (VANCOMYCIN HCL IN DEXTROSE) as directed FUROSEMIDE 20 MG TABS (FUROSEMIDE) 1 by mouth two times a day RIFAMPIN 300 MG CAPS (RIFAMPIN) 1 by mouth two times a day OMEGA-3 350 MG CAPS (OMEGA-3 FATTY ACIDS) 1 by mouth daily OMEPRAZOLE 20 MG CPDR (OMEPRAZOLE) 1 by mouth daily ACETAMINOPHEN 325 MG  TABS (ACETAMINOPHEN) 1 by mouth as needed      Allergies Added: NKDA  Visit Type:  Follow-up Primary Provider:  Joette Catching, MD  CC:  MRSA bacteremia.  History of Present Illness: The patient presents after a recent complicated hospitalization. He developed MRSA bacteremia from an infected IV site. He became septic with hypotension. He was treated aggressively with antibiotics. He required resection of the basilic vein. During his hospitalization multiple medications were discontinued because of his low blood pressure. He was tachycardic and had a couple of brief runs of atrial fibrillation. Because of this he did have firing of his defibrillator which was briefly turned off. He is now at home and doing much better. He is continuing to get home IV antibiotics. He has had no fevers or chills. He has had no chest discomfort, neck or arm discomfort. He denies any shortness of breath, PND or orthopnea. He has had no palpitations, presyncope or syncope. He has had no firings of his defibrillator.  Of note prior to this hospitalization he had a GI bleed from a Mallory-Weiss tear. Coumadin was discontinued at that time. During the recent hospitalization TEE demonstrated no evidence of the previous apical thrombus. There was no evidence of vegetation on his ICD or  wires. He did have moderate mitral regurgitation with an ejection fraction of 25% unchanged from previous.  Current Medications (verified): 1)  Klor-Con M10 10 Meq Cr-Tabs (Potassium Chloride Crys Cr) .... 2 By Mouth Once Daily 2)  Enalapril Maleate 20 Mg Tabs (Enalapril Maleate) .Marland Kitchen.. 1 By Mouth Daily 3)  Bisoprolol-Hydrochlorothiazide 10-6.25 Mg Tabs (Bisoprolol-Hydrochlorothiazide) .... 1/2  By Mouth Daily 4)  Lipitor 80 Mg Tabs (Atorvastatin Calcium) .Marland Kitchen.. 1 By Mouth Daily 5)  Niaspan 500 Mg Cr-Tabs (Niacin (Antihyperlipidemic)) .Marland Kitchen.. 1 By Mouth Once Daily 6)  Nitrostat 0.4 Mg Subl (Nitroglycerin) .Marland Kitchen.. 1 By Mouth As Needed 7)  Vancomycin Hcl in Dextrose 500 Mg/157ml Soln (Vancomycin Hcl in Dextrose) .... As Directed 8)  Furosemide 20 Mg Tabs (Furosemide) .Marland Kitchen.. 1 By Mouth Two Times A Day 9)  Rifampin 300 Mg Caps (Rifampin) .Marland Kitchen.. 1 By Mouth Two Times A Day 10)  Omega-3 350 Mg Caps (Omega-3 Fatty Acids) .Marland Kitchen.. 1 By Mouth Daily 11)  Omeprazole 20 Mg Cpdr (Omeprazole) .Marland Kitchen.. 1 By Mouth Daily 12)  Acetaminophen 325 Mg  Tabs (Acetaminophen) .Marland Kitchen.. 1 By Mouth As Needed  Allergies (verified): No Known Drug Allergies  Past History:  Past Medical History: Coronary Artery Disease s/p CABG (Cath 12/2009 Left main had 90% stenosis.   The LAD was occludedproximally.  There were mid and distal luminal irregularities.  The vessel was seen to fill via the LIMA graft.  There were 4 small diagonals.  They were patent.  There was a proximal first diagonal, which was large with 99% stenosis and filled via vein graft.  The circumflex had 99% proximal stenosis in the AV groove.  There was an obtuse marginal one, which  was moderate sized with 99% ostial stenosis. An obtuse marginal two, which was occluded at the ostium.  The right coronary artery was occluded distally.  There was severe diffuse mid disease.  The PDA was large and normal.Grafts:  Saphenous vein graft to the PDA had 30% stenosis at the anastomosis, but  was otherwise widely patent.  Saphenous vein graft sequential to OM1 and OM2 was widely patent.  Saphenous vein graft to the diagonal was widely patent.  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 Jude) Basilic vein left arm resection  Review of Systems       As stated in the HPI and negative for all other systems.   Vital Signs:  Patient profile:   55 year old male Height:      70 inches Weight:      161 pounds BMI:     23.18 Pulse rate:   77 / minute Resp:     16 per minute BP sitting:   130 / 84  (left arm)  Vitals Entered By: Marrion Coy, CNA (Mar 30, 2010 1:38 PM)  Physical Exam  General:  Well developed, well nourished, in no acute distress. Head:  normocephalic and atraumatic Eyes:  PERRLA/EOM intact; conjunctiva and lids normal. Mouth:  poor dentition, gums and palate normal. Oral mucosa normal. Neck:  Neck supple, no JVD. No masses, thyromegaly or abnormal cervical nodes. Chest Wall:  well-healed ICD pocket without drainage or erythema Lungs:  Clear bilaterally to auscultation and percussion. Abdomen:  Bowel sounds positive; abdomen soft and non-tender without masses, organomegaly, or hernias noted. No hepatosplenomegaly. Msk:  Back normal, normal gait. Muscle strength and tone normal. Extremities:  left arm is bandaged and swollen but improved from previous, mild bilateral lower extremity ankle edema Neurologic:  Alert and oriented x 3. Skin:  Intact without lesions or rashes. Cervical Nodes:  no significant adenopathy Axillary Nodes:  no significant adenopathy Inguinal Nodes:  no significant  adenopathy Psych:  Normal affect.   Detailed Cardiovascular Exam  Neck    Carotids: Carotids full and equal bilaterally without bruits.      Neck Veins: Normal, no JVD.    Heart    Inspection: no deformities or lifts noted.      Palpation: normal PMI with no thrills palpable.      Auscultation: regular rate and rhythm, S1, S2 without murmurs, rubs, gallops, or clicks.    Vascular    Abdominal Aorta: no palpable masses, pulsations, or audible bruits.      Femoral Pulses: normal femoral pulses bilaterally.      Pedal Pulses: normal pedal pulses bilaterally.      Radial Pulses: normal radial pulses bilaterally.      Peripheral Circulation: no clubbing, cyanosis, or edema noted with normal capillary refill.     EKG  Procedure date:  03/30/2010  Findings:      sinus rhythm, premature atrial contractions and ventricular escape beats, poor anterior R-wave progression, no acute ST-T wave changes   ICD Specifications Following MD:  Lewayne Bunting, MD     ICD Vendor:  St Jude     ICD Model Number:  [phone]     ICD Serial  Number:  [id number] ICD DOI:  01/05/2010     ICD Implanting MD:  Lewayne Bunting, MD  Lead 1:    Location: RV     DOI: 01/05/2010     Model #: 7120     Serial #: [id number]     Status: active  Indications::  CM   ICD Follow Up ICD Dependent:  No      Episodes Coumadin:  Yes  Brady Parameters Mode VVI     Lower Rate Limit:  40      Tachy Zones VF:  240     VT:  200     VT1:  179     Impression & Recommendations:  Problem # 1:  BACTEREMIA (ICD-790.7) The patient will complete a prolonged course of home IV antibiotics. When this is finished his family is instructed to notify us so we can do surveillance blood cultures. If there is ever any evidence of fever infection or positive blood cultures he will most likely need to have his defibrillator explanted. He will follow closely with Dr. Ladona Ridgel as well.  Problem # 2:  CARDIOMYOPATHY, ISCHEMIC (ICD-414.8) I will begin  to slowly creep back up on his heart failure medications by increasing his bisoprolol/HCT to one pill daily. The next step will be b.i.d. enalapril to his previous dose.  Problem # 3:  COUMADIN THERAPY (ICD-V58.61) I have discussed this with the patient and his family. There  no evidence of an apical thrombus., given the most recent problems with GI bleeding, I think it prudent to continue to hold the Coumadin. He will start his aspirin.  Problem # 4:  HYPERLIPIDEMIA (ICD-272.4) He will continue the meds as listed.  Other Orders: EKG w/ Interpretation (93000)  Patient Instructions: 1)  Your physician recommends that you schedule a follow-up appointment in: 1 month 2)  Your physician has recommended you make the following change in your medication: Increase Bisoprolol to one a day

## 2010-12-29 NOTE — Cardiovascular Report (Signed)
Summary: Office Visit   Office Visit   Imported By: Roderic Ovens 11/07/2010 13:58:06  _____________________________________________________________________  External Attachment:    Type:   Image     Comment:   External Document

## 2010-12-29 NOTE — Cardiovascular Report (Signed)
Summary: Office Visit   Office Visit   Imported By: Roderic Ovens 11/07/2010 14:00:12  _____________________________________________________________________  External Attachment:    Type:   Image     Comment:   External Document

## 2010-12-30 ENCOUNTER — Encounter: Payer: Self-pay | Admitting: Internal Medicine

## 2010-12-30 ENCOUNTER — Ambulatory Visit: Admit: 2010-12-30 | Payer: Self-pay

## 2010-12-30 ENCOUNTER — Encounter (INDEPENDENT_AMBULATORY_CARE_PROVIDER_SITE_OTHER): Payer: Self-pay

## 2010-12-30 DIAGNOSIS — I428 Other cardiomyopathies: Secondary | ICD-10-CM

## 2011-01-12 NOTE — Cardiovascular Report (Signed)
Summary: Office Visit   Office Visit   Imported By: Roderic Ovens 01/05/2011 16:10:58  _____________________________________________________________________  External Attachment:    Type:   Image     Comment:   External Document

## 2011-01-12 NOTE — Procedures (Signed)
Summary: [facility]      Allergies Added: NKDA  Current Medications (verified): 1)  Enalapril Maleate 20 Mg Tabs (Enalapril Maleate) .Marland Kitchen.. 1 By Mouth Daily 2)  Bisoprolol-Hydrochlorothiazide 10-6.25 Mg Tabs (Bisoprolol-Hydrochlorothiazide) .Marland Kitchen.. 1 Daily 3)  Lipitor 80 Mg Tabs (Atorvastatin Calcium) .Marland Kitchen.. 1 By Mouth Daily 4)  Niaspan 500 Mg Cr-Tabs (Niacin (Antihyperlipidemic)) .Marland Kitchen.. 1 By Mouth Once Daily 5)  Nitrostat 0.4 Mg Subl (Nitroglycerin) .Marland Kitchen.. 1 By Mouth As Needed 6)  Omega-3 350 Mg Caps (Omega-3 Fatty Acids) .Marland Kitchen.. 1 By Mouth Daily 7)  Omeprazole 20 Mg Cpdr (Omeprazole) .Marland Kitchen.. 1 By Mouth Daily 8)  Acetaminophen 325 Mg  Tabs (Acetaminophen) .Marland Kitchen.. 1 By Mouth As Needed 9)  Aspir-Low 81 Mg Tbec (Aspirin) .... Once Daily  Allergies (verified): No Known Drug Allergies   ICD Specifications Following MD:  Lewayne Bunting, MD     ICD Vendor:  St Jude     ICD Model Number:  [id number]     ICD Serial Number:  [id number] ICD DOI:  01/05/2010     ICD Implanting MD:  Lewayne Bunting, MD  Lead 1:    Location: RV     DOI: 01/05/2010     Model #: 7120     Serial #: [id number]     Status: active  Indications::  CM   ICD Follow Up Remote Check?  No Charge Time:  8.5 seconds     Battery Est. Longevity:  8.9 YEARS Underlying rhythm:  SR ICD Dependent:  No       ICD Device Measurements Right Ventricle:  Amplitude: 12 mV, Impedance: 430 ohms, Threshold: 0.75 V at 0.4 msec Shock Impedance: 45 ohms   Episodes Coumadin:  No Shock:  0     ATP:  0     Nonsustained:  0     Ventricular Pacing:  <1%  Brady Parameters Mode VVI     Lower Rate Limit:  40      Tachy Zones VF:  222     VT:  200     VT1:  179     Next Cardiology Appt Due:  03/28/2011 Tech Comments:  No parameter changes.  Device function normal.  No Merlin.  ROV 3 months with Dr. Ladona Ridgel in [place] Altha Harm, LPN  December 30, 2010 11:57 AM

## 2011-02-09 ENCOUNTER — Encounter: Payer: Self-pay | Admitting: *Deleted

## 2011-02-12 LAB — POCT I-STAT 3, ART BLOOD GAS (G3+)
Acid-Base Excess: 1 mmol/L (ref 0.0–2.0)
Bicarbonate: 25 mEq/L — ABNORMAL HIGH (ref 20.0–24.0)
O2 Saturation: 96 %
TCO2: 26 mmol/L (ref 0–100)

## 2011-02-12 LAB — POCT I-STAT 3, VENOUS BLOOD GAS (G3P V)
Bicarbonate: 24 mEq/L (ref 20.0–24.0)
O2 Saturation: 70 %
TCO2: 25 mmol/L (ref 0–100)
pCO2, Ven: 41 mmHg — ABNORMAL LOW (ref 45.0–50.0)
pO2, Ven: 38 mmHg (ref 30.0–45.0)

## 2011-02-14 LAB — DIFFERENTIAL
Basophils Absolute: 0 10*3/uL (ref 0.0–0.1)
Basophils Absolute: 0 10*3/uL (ref 0.0–0.1)
Basophils Relative: 0 % (ref 0–1)
Basophils Relative: 0 % (ref 0–1)
Basophils Relative: 0 % (ref 0–1)
Basophils Relative: 0 % (ref 0–1)
Eosinophils Absolute: 0 10*3/uL (ref 0.0–0.7)
Eosinophils Absolute: 0.1 10*3/uL (ref 0.0–0.7)
Eosinophils Relative: 0 % (ref 0–5)
Lymphocytes Relative: 19 % (ref 12–46)
Lymphs Abs: 0.3 10*3/uL — ABNORMAL LOW (ref 0.7–4.0)
Lymphs Abs: 0.3 10*3/uL — ABNORMAL LOW (ref 0.7–4.0)
Monocytes Absolute: 0.2 10*3/uL (ref 0.1–1.0)
Monocytes Absolute: 0.3 10*3/uL (ref 0.1–1.0)
Monocytes Relative: 2 % — ABNORMAL LOW (ref 3–12)
Monocytes Relative: 2 % — ABNORMAL LOW (ref 3–12)
Neutro Abs: 16 10*3/uL — ABNORMAL HIGH (ref 1.7–7.7)
Neutro Abs: 4.7 10*3/uL (ref 1.7–7.7)
Neutro Abs: 8 10*3/uL — ABNORMAL HIGH (ref 1.7–7.7)
Neutrophils Relative %: 96 % — ABNORMAL HIGH (ref 43–77)
Neutrophils Relative %: 69 % (ref 43–77)

## 2011-02-14 LAB — BASIC METABOLIC PANEL
BUN: 3 mg/dL — ABNORMAL LOW (ref 6–23)
BUN: 7 mg/dL (ref 6–23)
CO2: 22 mEq/L (ref 19–32)
CO2: 24 mEq/L (ref 19–32)
CO2: 28 mEq/L (ref 19–32)
CO2: 28 mEq/L (ref 19–32)
Calcium: 7.7 mg/dL — ABNORMAL LOW (ref 8.4–10.5)
Calcium: 7.8 mg/dL — ABNORMAL LOW (ref 8.4–10.5)
Calcium: 7.8 mg/dL — ABNORMAL LOW (ref 8.4–10.5)
Calcium: 7.9 mg/dL — ABNORMAL LOW (ref 8.4–10.5)
Chloride: 104 mEq/L (ref 96–112)
Chloride: 105 mEq/L (ref 96–112)
Creatinine, Ser: 0.79 mg/dL (ref 0.4–1.5)
Creatinine, Ser: 0.82 mg/dL (ref 0.4–1.5)
Creatinine, Ser: 0.83 mg/dL (ref 0.4–1.5)
Creatinine, Ser: 0.83 mg/dL (ref 0.4–1.5)
Creatinine, Ser: 0.84 mg/dL (ref 0.4–1.5)
Creatinine, Ser: 1.14 mg/dL (ref 0.4–1.5)
GFR calc Af Amer: >60 mL/min (ref >60)
GFR calc Af Amer: >60 mL/min (ref >60)
GFR calc Af Amer: >60 mL/min (ref >60)
GFR calc Af Amer: >60 mL/min (ref >60)
GFR calc non Af Amer: >60 mL/min (ref >60)
GFR calc non Af Amer: >60 mL/min (ref >60)
GFR calc non Af Amer: >60 mL/min (ref >60)
GFR calc non Af Amer: >60 mL/min (ref >60)
Glucose, Bld: 109 mg/dL — ABNORMAL HIGH (ref 70–99)
Glucose, Bld: 111 mg/dL — ABNORMAL HIGH (ref 70–99)
Glucose, Bld: 119 mg/dL — ABNORMAL HIGH (ref 70–99)
Potassium: 3.3 mEq/L — ABNORMAL LOW (ref 3.5–5.1)
Potassium: 3.5 mEq/L (ref 3.5–5.1)
Potassium: 3.6 mEq/L (ref 3.5–5.1)
Sodium: 131 mEq/L — ABNORMAL LOW (ref 135–145)
Sodium: 131 mEq/L — ABNORMAL LOW (ref 135–145)
Sodium: 132 mEq/L — ABNORMAL LOW (ref 135–145)
Sodium: 132 mEq/L — ABNORMAL LOW (ref 135–145)
Sodium: 135 mEq/L (ref 135–145)

## 2011-02-14 LAB — COMPREHENSIVE METABOLIC PANEL
ALT: 76 U/L — ABNORMAL HIGH (ref 0–53)
ALT: 82 U/L — ABNORMAL HIGH (ref 0–53)
AST: 86 U/L — ABNORMAL HIGH (ref 0–37)
Albumin: 2 g/dL — ABNORMAL LOW (ref 3.5–5.2)
Alkaline Phosphatase: 62 U/L (ref 39–117)
Alkaline Phosphatase: 91 U/L (ref 39–117)
BUN: 14 mg/dL (ref 6–23)
BUN: 10 mg/dL (ref 6–23)
CO2: 22 mEq/L (ref 19–32)
CO2: 20 mEq/L (ref 19–32)
Calcium: 7.5 mg/dL — ABNORMAL LOW (ref 8.4–10.5)
Calcium: 7.2 mg/dL — ABNORMAL LOW (ref 8.4–10.5)
Calcium: 7.5 mg/dL — ABNORMAL LOW (ref 8.4–10.5)
Chloride: 104 mEq/L (ref 96–112)
GFR calc Af Amer: >60 mL/min (ref >60)
GFR calc non Af Amer: 55 mL/min — ABNORMAL LOW (ref >60)
GFR calc non Af Amer: >60 mL/min (ref >60)
Glucose, Bld: 135 mg/dL — ABNORMAL HIGH (ref 70–99)
Glucose, Bld: 105 mg/dL — ABNORMAL HIGH (ref 70–99)
Potassium: 3.4 mEq/L — ABNORMAL LOW (ref 3.5–5.1)
Potassium: 3.7 mEq/L (ref 3.5–5.1)
Sodium: 131 mEq/L — ABNORMAL LOW (ref 135–145)
Sodium: 132 mEq/L — ABNORMAL LOW (ref 135–145)
Total Protein: 5.6 g/dL — ABNORMAL LOW (ref 6.0–8.3)
Total Protein: 4.8 g/dL — ABNORMAL LOW (ref 6.0–8.3)

## 2011-02-14 LAB — CBC
HCT: 27.8 % — ABNORMAL LOW (ref 39.0–52.0)
HCT: 24.1 % — ABNORMAL LOW (ref 39.0–52.0)
HCT: 24.8 % — ABNORMAL LOW (ref 39.0–52.0)
HCT: 27.7 % — ABNORMAL LOW (ref 39.0–52.0)
Hemoglobin: 10.2 g/dL — ABNORMAL LOW (ref 13.0–17.0)
Hemoglobin: 8.4 g/dL — ABNORMAL LOW (ref 13.0–17.0)
Hemoglobin: 8.4 g/dL — ABNORMAL LOW (ref 13.0–17.0)
Hemoglobin: 8.9 g/dL — ABNORMAL LOW (ref 13.0–17.0)
Hemoglobin: 9 g/dL — ABNORMAL LOW (ref 13.0–17.0)
Hemoglobin: 9.2 g/dL — ABNORMAL LOW (ref 13.0–17.0)
Hemoglobin: 9.7 g/dL — ABNORMAL LOW (ref 13.0–17.0)
MCHC: 36.8 g/dL — ABNORMAL HIGH (ref 30.0–36.0)
MCHC: 33.8 g/dL (ref 30.0–36.0)
MCHC: 34.1 g/dL (ref 30.0–36.0)
MCHC: 34.6 g/dL (ref 30.0–36.0)
MCHC: 35 g/dL (ref 30.0–36.0)
MCHC: 35.2 g/dL (ref 30.0–36.0)
MCHC: 35.5 g/dL (ref 30.0–36.0)
MCV: 90.3 fL (ref 78.0–100.0)
MCV: 90.6 fL (ref 78.0–100.0)
Platelets: 205 10*3/uL (ref 150–400)
Platelets: 305 10*3/uL (ref 150–400)
Platelets: 98 10*3/uL — ABNORMAL LOW (ref 150–400)
RBC: 2.67 MIL/uL — ABNORMAL LOW (ref 4.22–5.81)
RBC: 2.7 MIL/uL — ABNORMAL LOW (ref 4.22–5.81)
RBC: 2.74 MIL/uL — ABNORMAL LOW (ref 4.22–5.81)
RBC: 2.88 MIL/uL — ABNORMAL LOW (ref 4.22–5.81)
RDW: 13.9 % (ref 11.5–15.5)
RDW: 14.6 % (ref 11.5–15.5)
RDW: 14.8 % (ref 11.5–15.5)
RDW: 15 % (ref 11.5–15.5)
RDW: 15.4 % (ref 11.5–15.5)
WBC: 4.4 10*3/uL (ref 4.0–10.5)
WBC: 5.9 10*3/uL (ref 4.0–10.5)
WBC: 6.3 10*3/uL (ref 4.0–10.5)
WBC: 8.2 10*3/uL (ref 4.0–10.5)
WBC: 9.1 10*3/uL (ref 4.0–10.5)

## 2011-02-14 LAB — URINALYSIS, ROUTINE W REFLEX MICROSCOPIC
Bilirubin Urine: NEGATIVE
Nitrite: NEGATIVE
Protein, ur: 30 mg/dL — AB
Specific Gravity, Urine: 1.01 (ref 1.005–1.030)
Urobilinogen, UA: 1 mg/dL (ref 0.0–1.0)

## 2011-02-14 LAB — PHOSPHORUS
Phosphorus: 2.3 mg/dL (ref 2.3–4.6)
Phosphorus: 2.8 mg/dL (ref 2.3–4.6)

## 2011-02-14 LAB — GLUCOSE, CAPILLARY
Glucose-Capillary: 101 mg/dL — ABNORMAL HIGH (ref 70–99)
Glucose-Capillary: 103 mg/dL — ABNORMAL HIGH (ref 70–99)
Glucose-Capillary: 104 mg/dL — ABNORMAL HIGH (ref 70–99)
Glucose-Capillary: 107 mg/dL — ABNORMAL HIGH (ref 70–99)
Glucose-Capillary: 108 mg/dL — ABNORMAL HIGH (ref 70–99)
Glucose-Capillary: 114 mg/dL — ABNORMAL HIGH (ref 70–99)
Glucose-Capillary: 124 mg/dL — ABNORMAL HIGH (ref 70–99)
Glucose-Capillary: 128 mg/dL — ABNORMAL HIGH (ref 70–99)
Glucose-Capillary: 133 mg/dL — ABNORMAL HIGH (ref 70–99)
Glucose-Capillary: 140 mg/dL — ABNORMAL HIGH (ref 70–99)
Glucose-Capillary: 141 mg/dL — ABNORMAL HIGH (ref 70–99)
Glucose-Capillary: 158 mg/dL — ABNORMAL HIGH (ref 70–99)
Glucose-Capillary: 87 mg/dL (ref 70–99)
Glucose-Capillary: 95 mg/dL (ref 70–99)
Glucose-Capillary: 99 mg/dL (ref 70–99)

## 2011-02-14 LAB — MAGNESIUM: Magnesium: 2 mg/dL (ref 1.5–2.5)

## 2011-02-14 LAB — CULTURE, BLOOD (ROUTINE X 2): Culture: NO GROWTH

## 2011-02-14 LAB — ABO/RH: ABO/RH(D): A POS

## 2011-02-14 LAB — APTT: aPTT: 38 seconds — ABNORMAL HIGH (ref 24–37)

## 2011-02-14 LAB — VANCOMYCIN, TROUGH: Vancomycin Tr: 35.1 ug/mL (ref 10.0–20.0)

## 2011-02-14 LAB — WOUND CULTURE

## 2011-02-14 LAB — PROTIME-INR
INR: 1.36 (ref 0.00–1.49)
Prothrombin Time: 16.7 seconds — ABNORMAL HIGH (ref 11.6–15.2)

## 2011-02-14 LAB — TYPE AND SCREEN

## 2011-02-14 LAB — URINE MICROSCOPIC-ADD ON

## 2011-02-14 LAB — T3: T3, Total: 57.2 ng/dl — ABNORMAL LOW (ref 80.0–204.0)

## 2011-02-14 LAB — T4: T4, Total: 7 ug/dL (ref 5.0–12.5)

## 2011-02-14 LAB — LACTIC ACID, PLASMA: Lactic Acid, Venous: 2.2 mmol/L (ref 0.5–2.2)

## 2011-02-14 LAB — BRAIN NATRIURETIC PEPTIDE: Pro B Natriuretic peptide (BNP): 127 pg/mL — ABNORMAL HIGH (ref 0.0–100.0)

## 2011-02-14 NOTE — Miscellaneous (Signed)
Summary: lipitor samples  Clinical Lists Changes  Medications: Changed medication from LIPITOR 80 MG TABS (ATORVASTATIN CALCIUM) 1 by mouth daily to LIPITOR 80 MG TABS (ATORVASTATIN CALCIUM) 1 by mouth daily

## 2011-02-15 LAB — URINALYSIS, ROUTINE W REFLEX MICROSCOPIC
Bilirubin Urine: NEGATIVE
Glucose, UA: NEGATIVE mg/dL
Hgb urine dipstick: NEGATIVE
Ketones, ur: 15 mg/dL — AB
Nitrite: NEGATIVE
Protein, ur: NEGATIVE mg/dL
Specific Gravity, Urine: 1.025 (ref 1.005–1.030)
Urobilinogen, UA: 0.2 mg/dL (ref 0.0–1.0)
pH: 5 (ref 5.0–8.0)

## 2011-02-15 LAB — COMPREHENSIVE METABOLIC PANEL
ALT: 14 U/L (ref 0–53)
AST: 17 U/L (ref 0–37)
Albumin: 2.7 g/dL — ABNORMAL LOW (ref 3.5–5.2)
Albumin: 3.6 g/dL (ref 3.5–5.2)
Alkaline Phosphatase: 61 U/L (ref 39–117)
BUN: 25 mg/dL — ABNORMAL HIGH (ref 6–23)
BUN: 27 mg/dL — ABNORMAL HIGH (ref 6–23)
CO2: 23 mEq/L (ref 19–32)
Calcium: 7.4 mg/dL — ABNORMAL LOW (ref 8.4–10.5)
Chloride: 105 mEq/L (ref 96–112)
Chloride: 112 mEq/L (ref 96–112)
Creatinine, Ser: 1.18 mg/dL (ref 0.4–1.5)
Creatinine, Ser: 1.87 mg/dL — ABNORMAL HIGH (ref 0.4–1.5)
GFR calc Af Amer: >60 mL/min (ref >60)
GFR calc non Af Amer: 38 mL/min — ABNORMAL LOW (ref >60)
GFR calc non Af Amer: >60 mL/min (ref >60)
Glucose, Bld: 122 mg/dL — ABNORMAL HIGH (ref 70–99)
Potassium: 4.1 mEq/L (ref 3.5–5.1)
Sodium: 138 mEq/L (ref 135–145)
Total Bilirubin: 0.8 mg/dL (ref 0.3–1.2)
Total Bilirubin: 1.1 mg/dL (ref 0.3–1.2)
Total Protein: 4.9 g/dL — ABNORMAL LOW (ref 6.0–8.3)

## 2011-02-15 LAB — DIFFERENTIAL
Basophils Absolute: 0 10*3/uL (ref 0.0–0.1)
Basophils Absolute: 0 10*3/uL (ref 0.0–0.1)
Basophils Absolute: 0 10*3/uL (ref 0.0–0.1)
Basophils Absolute: 0 10*3/uL (ref 0.0–0.1)
Basophils Relative: 0 % (ref 0–1)
Basophils Relative: 0 % (ref 0–1)
Basophils Relative: 0 % (ref 0–1)
Basophils Relative: 0 % (ref 0–1)
Eosinophils Absolute: 0 10*3/uL (ref 0.0–0.7)
Eosinophils Absolute: 0 10*3/uL (ref 0.0–0.7)
Eosinophils Absolute: 0 10*3/uL (ref 0.0–0.7)
Eosinophils Relative: 0 % (ref 0–5)
Eosinophils Relative: 0 % (ref 0–5)
Lymphocytes Relative: 10 % — ABNORMAL LOW (ref 12–46)
Lymphocytes Relative: 18 % (ref 12–46)
Lymphocytes Relative: 7 % — ABNORMAL LOW (ref 12–46)
Lymphocytes Relative: 8 % — ABNORMAL LOW (ref 12–46)
Lymphs Abs: 0.6 10*3/uL — ABNORMAL LOW (ref 0.7–4.0)
Lymphs Abs: 0.6 10*3/uL — ABNORMAL LOW (ref 0.7–4.0)
Lymphs Abs: 1.2 10*3/uL (ref 0.7–4.0)
Monocytes Absolute: 0.4 10*3/uL (ref 0.1–1.0)
Monocytes Absolute: 0.5 10*3/uL (ref 0.1–1.0)
Monocytes Absolute: 0.5 10*3/uL (ref 0.1–1.0)
Monocytes Absolute: 0.6 10*3/uL (ref 0.1–1.0)
Monocytes Absolute: 0.9 10*3/uL (ref 0.1–1.0)
Monocytes Relative: 6 % (ref 3–12)
Monocytes Relative: 7 % (ref 3–12)
Monocytes Relative: 7 % (ref 3–12)
Monocytes Relative: 8 % (ref 3–12)
Neutro Abs: 16.3 10*3/uL — ABNORMAL HIGH (ref 1.7–7.7)
Neutro Abs: 4.7 10*3/uL (ref 1.7–7.7)
Neutro Abs: 5.3 10*3/uL (ref 1.7–7.7)
Neutro Abs: 5.4 10*3/uL (ref 1.7–7.7)
Neutro Abs: 5.6 10*3/uL (ref 1.7–7.7)
Neutrophils Relative %: 73 % (ref 43–77)
Neutrophils Relative %: 79 % — ABNORMAL HIGH (ref 43–77)
Neutrophils Relative %: 81 % — ABNORMAL HIGH (ref 43–77)
Neutrophils Relative %: 84 % — ABNORMAL HIGH (ref 43–77)
Neutrophils Relative %: 90 % — ABNORMAL HIGH (ref 43–77)

## 2011-02-15 LAB — CBC
HCT: 27.7 % — ABNORMAL LOW (ref 39.0–52.0)
HCT: 29.1 % — ABNORMAL LOW (ref 39.0–52.0)
HCT: 42 % (ref 39.0–52.0)
Hemoglobin: 10.2 g/dL — ABNORMAL LOW (ref 13.0–17.0)
Hemoglobin: 9.1 g/dL — ABNORMAL LOW (ref 13.0–17.0)
Hemoglobin: 9.3 g/dL — ABNORMAL LOW (ref 13.0–17.0)
Hemoglobin: 9.8 g/dL — ABNORMAL LOW (ref 13.0–17.0)
MCHC: 35.4 g/dL (ref 30.0–36.0)
MCHC: 35.5 g/dL (ref 30.0–36.0)
MCHC: 35.5 g/dL (ref 30.0–36.0)
MCV: 87.3 fL (ref 78.0–100.0)
MCV: 87.8 fL (ref 78.0–100.0)
MCV: 88.8 fL (ref 78.0–100.0)
Platelets: 118 10*3/uL — ABNORMAL LOW (ref 150–400)
Platelets: 127 10*3/uL — ABNORMAL LOW (ref 150–400)
Platelets: 130 10*3/uL — ABNORMAL LOW (ref 150–400)
Platelets: 130 10*3/uL — ABNORMAL LOW (ref 150–400)
Platelets: 280 10*3/uL (ref 150–400)
RBC: 2.98 MIL/uL — ABNORMAL LOW (ref 4.22–5.81)
RBC: 2.99 MIL/uL — ABNORMAL LOW (ref 4.22–5.81)
RBC: 3.17 MIL/uL — ABNORMAL LOW (ref 4.22–5.81)
RBC: 3.32 MIL/uL — ABNORMAL LOW (ref 4.22–5.81)
RDW: 14 % (ref 11.5–15.5)
RDW: 14.1 % (ref 11.5–15.5)
RDW: 14.1 % (ref 11.5–15.5)
RDW: 14.2 % (ref 11.5–15.5)
RDW: 14.3 % (ref 11.5–15.5)
WBC: 18.5 10*3/uL — ABNORMAL HIGH (ref 4.0–10.5)
WBC: 6.5 10*3/uL (ref 4.0–10.5)
WBC: 6.6 10*3/uL (ref 4.0–10.5)
WBC: 7.8 10*3/uL (ref 4.0–10.5)
WBC: 8.6 10*3/uL (ref 4.0–10.5)

## 2011-02-15 LAB — CARDIAC PANEL(CRET KIN+CKTOT+MB+TROPI)
CK, MB: 0.9 ng/mL (ref 0.3–4.0)
CK, MB: 1 ng/mL (ref 0.3–4.0)
CK, MB: 1.1 ng/mL (ref 0.3–4.0)
CK, MB: 1.3 ng/mL (ref 0.3–4.0)
Relative Index: INVALID (ref 0.0–2.5)
Relative Index: INVALID (ref 0.0–2.5)
Relative Index: INVALID (ref 0.0–2.5)
Relative Index: INVALID (ref 0.0–2.5)
Total CK: 52 U/L (ref 7–232)
Total CK: 53 U/L (ref 7–232)
Total CK: 58 U/L (ref 7–232)
Total CK: 62 U/L (ref 7–232)
Troponin I: 0.02 ng/mL (ref 0.00–0.06)
Troponin I: 0.03 ng/mL (ref 0.00–0.06)
Troponin I: 0.03 ng/mL (ref 0.00–0.06)
Troponin I: 0.04 ng/mL (ref 0.00–0.06)

## 2011-02-15 LAB — BASIC METABOLIC PANEL
BUN: 5 mg/dL — ABNORMAL LOW (ref 6–23)
Calcium: 7.4 mg/dL — ABNORMAL LOW (ref 8.4–10.5)
Calcium: 8.1 mg/dL — ABNORMAL LOW (ref 8.4–10.5)
GFR calc Af Amer: >60 mL/min (ref >60)
GFR calc non Af Amer: >60 mL/min (ref >60)
GFR calc non Af Amer: >60 mL/min (ref >60)
Glucose, Bld: 97 mg/dL (ref 70–99)
Potassium: 3.4 mEq/L — ABNORMAL LOW (ref 3.5–5.1)
Sodium: 138 mEq/L (ref 135–145)
Sodium: 140 mEq/L (ref 135–145)

## 2011-02-15 LAB — PREPARE FRESH FROZEN PLASMA

## 2011-02-15 LAB — TYPE AND SCREEN: ABO/RH(D): A POS

## 2011-02-15 LAB — URINE CULTURE: Colony Count: NO GROWTH

## 2011-02-15 LAB — CK TOTAL AND CKMB (NOT AT ARMC)
CK, MB: 0.6 ng/mL (ref 0.3–4.0)
Relative Index: INVALID (ref 0.0–2.5)

## 2011-02-15 LAB — RAPID URINE DRUG SCREEN, HOSP PERFORMED
Amphetamines: NOT DETECTED
Barbiturates: NOT DETECTED
Benzodiazepines: NOT DETECTED
Cocaine: NOT DETECTED
Opiates: NOT DETECTED
Tetrahydrocannabinol: NOT DETECTED

## 2011-02-15 LAB — PROTIME-INR
INR: 1.41 (ref 0.00–1.49)
INR: 2.52 — ABNORMAL HIGH (ref 0.00–1.49)
Prothrombin Time: 17.1 seconds — ABNORMAL HIGH (ref 11.6–15.2)
Prothrombin Time: 27 seconds — ABNORMAL HIGH (ref 11.6–15.2)

## 2011-02-15 LAB — APTT: aPTT: 32 seconds (ref 24–37)

## 2011-02-15 LAB — LIPID PANEL
Cholesterol: 81 mg/dL (ref 0–200)
HDL: 25 mg/dL — ABNORMAL LOW (ref >39)
LDL Cholesterol: 46 mg/dL (ref 0–99)
Triglycerides: 50 mg/dL (ref <150)

## 2011-02-15 LAB — TROPONIN I: Troponin I: 0.01 ng/mL (ref 0.00–0.06)

## 2011-02-15 LAB — BRAIN NATRIURETIC PEPTIDE: Pro B Natriuretic peptide (BNP): <30 pg/mL (ref 0.0–100.0)

## 2011-02-15 LAB — MRSA PCR SCREENING

## 2011-02-16 LAB — BASIC METABOLIC PANEL
CO2: 26 mEq/L (ref 19–32)
Chloride: 105 mEq/L (ref 96–112)
Creatinine, Ser: 1.03 mg/dL (ref 0.4–1.5)
GFR calc Af Amer: >60 mL/min (ref >60)
Potassium: 3.7 mEq/L (ref 3.5–5.1)
Sodium: 136 mEq/L (ref 135–145)

## 2011-02-16 LAB — PROTIME-INR
INR: 1.1 (ref 0.00–1.49)
INR: 1.14 (ref 0.00–1.49)
Prothrombin Time: 14.1 seconds (ref 11.6–15.2)
Prothrombin Time: 14.5 seconds (ref 11.6–15.2)

## 2011-03-01 ENCOUNTER — Encounter: Payer: Self-pay | Admitting: Cardiology

## 2011-03-01 ENCOUNTER — Ambulatory Visit (INDEPENDENT_AMBULATORY_CARE_PROVIDER_SITE_OTHER): Payer: Self-pay | Admitting: Cardiology

## 2011-03-01 VITALS — BP 118/70 | HR 69 | Ht 71.0 in | Wt 174.0 lb

## 2011-03-01 DIAGNOSIS — E785 Hyperlipidemia, unspecified: Secondary | ICD-10-CM

## 2011-03-01 DIAGNOSIS — I428 Other cardiomyopathies: Secondary | ICD-10-CM

## 2011-03-01 DIAGNOSIS — I2510 Atherosclerotic heart disease of native coronary artery without angina pectoris: Secondary | ICD-10-CM | POA: Insufficient documentation

## 2011-03-01 NOTE — Patient Instructions (Signed)
See Dr Antoine Poche back in 6 months in [place] Continue your current medications

## 2011-03-01 NOTE — Progress Notes (Signed)
HPI The patient returns for routine followup. Since I last saw him he has done very well. He is walking 3 or 4 times per week. With this level of activity he denies any chest discomfort, neck or arm discomfort. He is not noticing any shortness of breath, PND or orthopnea. He has had no palpitations, presyncope or syncope. He denies any chest pressure, neck or arm discomfort. He has had no weight gain or edema.  No Known Allergies  Current Outpatient Prescriptions  Medication Sig Dispense Refill  . aspirin 81 MG tablet Take 81 mg by mouth daily.        Marland Kitchen atorvastatin (LIPITOR) 80 MG tablet Take 80 mg by mouth daily.        . bisoprolol-hydrochlorothiazide (ZIAC) 10-6.25 MG per tablet Take 1 tablet by mouth daily.        . enalapril (VASOTEC) 20 MG tablet Take 20 mg by mouth daily.        . fish oil-omega-3 fatty acids 1000 MG capsule Take 2 g by mouth daily.        . niacin (NIASPAN) 500 MG CR tablet Take 500 mg by mouth at bedtime.        Marland Kitchen omeprazole (PRILOSEC) 20 MG capsule Take 20 mg by mouth daily.          Past Medical History  Diagnosis Date  . Left ventricular dysfunction     apical thrombus  . CHF (congestive heart failure)     EF 20%  . GI bleed     Mallory-Weiss tear EGD 02/2010  . Small bowel obstruction 2007    resolved without surgery  . Ileus 2006    required hospitalization  . Hyperlipidemia   . MRSA bacteremia   . Coronary artery disease   . 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 Jude    Past Surgical History  Procedure Date  . Coronary artery bypass graft     6 vessels, 2000  . Insert / replace / remove pacemaker 01/05/10    ICD insertion/St. Jude single chamber  . Basilic vein left arm resection     ROS  PHYSICAL EXAM BP 118/70  Pulse 69  Ht 5\' 11"  (1.803 m)  Wt 174 lb (78.926 kg)  BMI 24.27 kg/m2 GENERAL:  Well appearing HEENT:  Pupils equal round and reactive, fundi not visualized, oral mucosa  unremarkable, Very poor dentition. NECK:  No jugular venous distention, waveform within normal limits, carotid upstroke brisk and symmetric, no bruits, no thyromegaly LYMPHATICS:  No cervical, inguinal adenopathy LUNGS:  Clear to auscultation bilaterally BACK:  No CVA tenderness CHEST:  Well healed sternotomy scar, ICD scar HEART:  PMI not displaced or sustained,S1 and S2 within normal limits, no S3, no S4, no clicks, no rubs, no murmurs ABD:  Flat, positive bowel sounds normal in frequency in pitch, no bruits, no rebound, no guarding, no midline pulsatile mass, no hepatomegaly, no splenomegaly EXT:  2 plus pulses throughout, trace edema, no cyanosis no clubbing SKIN:  No rashes no nodules NEURO:  Cranial nerves II through XII grossly intact, motor grossly intact throughout PSYCH:  Cognitively intact, oriented to person place and time  EKG: Sinus rhythm, rate 67, axis within normal limits, intervals within normal limits, old anterior infarct, unchanged from previous  ASSESSMENT AND PLAN

## 2011-03-01 NOTE — Assessment & Plan Note (Signed)
The patient has no new sypmtoms.  No further cardiovascular testing is indicated.  We will continue with aggressive risk reduction and meds as listed.  

## 2011-03-01 NOTE — Assessment & Plan Note (Signed)
Mr. Gunning seems to be euvolemic.  At this point, no change in therapy is indicated.  We have reviewed salt and fluid restrictions.  No further cardiovascular testing is indicated.

## 2011-03-01 NOTE — Assessment & Plan Note (Signed)
He has this followed by his primary provider. Her goal LDL less than 100 HDL greater than 40. He is due to have this checked soon.

## 2011-03-08 LAB — PROTIME-INR: Prothrombin Time: 22.8 seconds — ABNORMAL HIGH (ref 11.6–15.2)

## 2011-03-08 LAB — BASIC METABOLIC PANEL
CO2: 23 mEq/L (ref 19–32)
Calcium: 8.9 mg/dL (ref 8.4–10.5)
Creatinine, Ser: 1.11 mg/dL (ref 0.4–1.5)
GFR calc Af Amer: >60 mL/min (ref >60)
GFR calc non Af Amer: >60 mL/min (ref >60)

## 2011-03-08 LAB — POCT CARDIAC MARKERS
CKMB, poc: <1 ng/mL — ABNORMAL LOW (ref 1.0–8.0)
CKMB, poc: <1 ng/mL — ABNORMAL LOW (ref 1.0–8.0)
Myoglobin, poc: 44.5 ng/mL (ref 12–200)
Myoglobin, poc: 70 ng/mL (ref 12–200)

## 2011-03-08 LAB — DIFFERENTIAL
Basophils Absolute: 0 10*3/uL (ref 0.0–0.1)
Basophils Relative: 0 % (ref 0–1)
Lymphocytes Relative: 14 % (ref 12–46)
Monocytes Relative: 7 % (ref 3–12)
Neutro Abs: 7 10*3/uL (ref 1.7–7.7)
Neutrophils Relative %: 78 % — ABNORMAL HIGH (ref 43–77)

## 2011-03-08 LAB — CBC
MCHC: 34.4 g/dL (ref 30.0–36.0)
RBC: 4.68 MIL/uL (ref 4.22–5.81)

## 2011-04-11 NOTE — Assessment & Plan Note (Signed)
OFFICE VISIT   JESHURUN, OAXACA  DOB:  Jan 10, 1956                                       04/22/2010  CHART#:[id number]   Date of surgery was 03/16/2010.  The surgery was excision of left  basilic vein by Dr. Edilia Bo.   The patient is a very pleasant 55 year old gentleman who in April  underwent excision of a basilic vein which was infected.  He was seen  approximately 2 weeks ago.  At that time his wounds appeared to be  healing very well.  He returns today for wound check and assessment.  Of  note, he has remained stable as regarding his coronary artery disease.   Physical findings revealed a very pleasant gentleman in no apparent  distress.  Heart rate was 66, blood pressure 133/86, O2 sat was 100%.  HEENT:  EOMI.  Mucous membranes were pink and moist.  Cardiac exam  revealed a regular rate and rhythm.  Attention was turned to the left  arm at the incision points of his basilic excision.  This was well-  healed.  I did remove the staples.   The patient has done very well following excision of his infected  basilic vein.  We will follow him on a p.r.n. basis.   Wilmon Arms, PA   Larina Earthly, M.D.  Electronically Signed   KEL/MEDQ  D:  04/22/2010  T:  04/22/2010  Job:  [id number]

## 2011-04-11 NOTE — Assessment & Plan Note (Signed)
OFFICE VISIT   TOBIN, WITUCKI  DOB:  [birth year]                                       04/07/2010  CHART#:[id number]   Date of surgery is 03/16/2010.  Procedure was excision of left basilic  vein.   The patient is a 55 year old gentleman who presented with positive blood  cultures for Staph aureus with a wound infection from an IV site in his  left arm.  He had a thrombosed basilic vein with a clinical picture of  some septic thrombophlebitis.  His basilic vein was excised in the  antecubital space.  He returns to clinic today for wound check.  He has  two separate incisions.  The first incision in the upper arm is healing  very well.  It has a scab on it.  The second incision in the forearm is  being treated with hydrogel and wet-to-dry dressing changes.  This wound  looks very clean.  It has good granulation tissue in it.  There are two  sutures present.   PHYSICAL EXAMINATION:  Vital signs:  His heart rate was 75, O2  saturations 99%, temperature was 98 degrees.  He did have PICC line in  his right upper extremity.  General:  He appeared to be well-nourished  and was in no distress.  HEENT:  PERRLA, EOMI.  Mucous membranes were  pink and moist.  Cardiac:  Exam revealed a regular rate and rhythm.   ASSESSMENT AND PLAN:  The patient's wounds continue to heal very well.  We will continue with the hydrogel and wet-to-dry dressing changes.  We  will have him to return to see [place] in 2 weeks at which time his incisions  should be healed and we can at that time remove the sutures.   Wilmon Arms, PA   Di Kindle. Edilia Bo, M.D.  Electronically Signed   KEL/MEDQ  D:  04/07/2010  T:  04/07/2010  Job:  [id number]

## 2011-04-11 NOTE — Assessment & Plan Note (Signed)
[facility]                            CARDIOLOGY OFFICE NOTE   NAME:Joseph [name], Joseph [name]                        MRN:          [id number]  DATE:06/23/2009                            DOB:          01/28/1956    PRIMARY CARE PHYSICIAN:  Joseph [name], M.D.   REASON FOR PRESENTATION:  Evaluate the patient with ischemic  cardiomyopathy and coronary disease.   HISTORY OF PRESENT ILLNESS:  The patient is now 55 years old.  He  presents for yearly followup.  Since I last saw him, he has done well.  He has had one episode of palpitations.  He went to [facility].  He says a couple months ago.  He said he was checked that in the ER and  there were no arrhythmias.  No EKG abnormalities and no lab  abnormalities.  He has otherwise not had any palpitations.  He does not  have any presyncope or syncope.  He does not have any chest discomfort,  neck or arm discomfort.  He denies any shortness of breath, PND, or  orthopnea.  He had followup of his lipids and he said recently they were  somewhat elevated and he just had these checked again with Dr. Lysbeth [name]  but has not heard back.   PAST MEDICAL HISTORY:  Coronary artery disease, status post CABG (in  1990 LIMA to the LAD, SVG to obtuse marginal, sequential to distal  circumflex, SVG to PDA and distal circumflex, SVG to diagonal), ischemic  cardiomyopathy (EF 30%), stress perfusion study in 2009 (his EF was 34%,  there was a large infarct but no ischemia), previous apical thrombus,  dyslipidemia, hypertension, small bowel obstruction of unclear etiology.   ALLERGIES/INTOLERANCES:  None.   MEDICATIONS:  1. Bisoprolol/HCT 10/6.25.  2. Lipitor 40 mg daily.  3. Coumadin.  4. Potassium 20 mg daily.  5. Enalapril 20 mg daily.  6. Niaspan.  7. Fish oil.   REVIEW OF SYSTEMS:  As stated in the HPI, otherwise negative for all  other systems.   PHYSICAL EXAMINATION:  GENERAL:  The patient is in no distress.  VITAL SIGNS:  Blood pressure 110/68, heart rate 64 and regular.  HEENT:  Eyes are unremarkable.  Pupils equal, round, and reactive to  light.  Fundi not visualized.  Oral mucosa, unremarkable.  NECK:  No jugular venous distention at 45 degrees.  Carotid upstroke  brisk and symmetric.  No bruits.  No thyromegaly.  LYMPHATICS:  No cervical, axillary, inguinal adenopathy.  LUNGS:  Clear to auscultation bilaterally.  BACK:  No costovertebral angle mass.  CHEST:  Unremarkable.  HEART:  PMI not displaced or sustained.  S1, S2 within normal limits.  No S3, no S4, no clicks, no rubs, no rubs, 2/6 apical systolic murmur  radiating slightly out the aortic outflow tract, no diastolic murmurs.  ABDOMEN:  Flat, positive bowel sounds, normal frequency and pitch, no  bruits, no rebound, no guarding, no midline pulsatile mass, no  hepatomegaly, no splenomegaly.  SKIN:  No rashes, no nodules.  EXTREMITIES:  Pulses 2+ throughout, mild bilateral ankle  edema, left  greater than right.  NEURO:  Oriented to person, place, time.  Cranial nerves grossly intact.  Motor grossly intact.   EKG sinus rhythm, rate 67, axis within normal limits, intervals within  normal limits, ST-segment lesion in the anterior leads unchanged from  previous, old anteroseptal infarct.   ASSESSMENT AND PLAN:  1. Coronary disease.  The patient has no new symptoms.  He had a      stress perfusion study last year.  No further cardiovascular      testing is suggested.  He will continue aggressive risk reduction.  2. Ischemic cardiomyopathy.  He seems to be euvolemic.  He is on a      reasonable medical regimen.  I make no change to this regimen.  He      does not want a defibrillator.  We discussed this again.  3. Dyslipidemia per Dr. Lysbeth [name].  He is on dual therapy, and I am sure      having excellent followup.  The goal should be an LDL less than      100, HDL greater than 40.  4. Followup.  We will see him back in 1 year or sooner  if needed.     Joseph Rotunda, MD, [facility]  Electronically Signed    JH/MedQ  DD: 06/23/2009  DT: 06/24/2009  Job #: [id number]   cc:   Joseph [name], M.D.

## 2011-04-11 NOTE — Assessment & Plan Note (Signed)
[facility]                            CARDIOLOGY OFFICE NOTE   NAME:Joseph [name], Joseph [name]                        MRN:          [id number]  DATE:05/27/2008                            DOB:          06-Jun-1956    PRIMARY CARE PHYSICIAN:  Delaney Meigs, MD   REASON FOR PRESENTATION:  Evaluate the patient with ischemic  cardiomyopathy and coronary disease.   HISTORY OF PRESENT ILLNESS:  The patient presents for yearly followup.  He is now 55 years old.  He says in the last year he has done well.  He  works.  He exercises most weeks, although he has not done it much now  that it has been hot.  He says with his level of activity which includes  walking for exercise, he does not get any chest pressure, neck or arm  discomfort.  He does not have any palpitations, presyncope, or syncope.  He has had no exercise-induced shortness of breath and denies any PND or  orthopnea.   The patient has had followup of his lipids and this was in excellent  control.  He practices risk reduction.   PAST MEDICAL HISTORY:  1. Coronary artery disease status post coronary artery bypass graft      (1990 LIMA to the LAD, SVG to obtuse marginal, sequential to distal      circumflex, SVG to PDA and distal circumflex, SVG to diagonal).  2. Ischemic cardiomyopathy with an ejection fraction of 30%.  3. Previous apical thrombus.  4. Dyslipidemia.  5. Hypertension.  6. Small-bowel obstruction of unclear etiology.   ALLERGIES AND INTOLERANCES:  None.   MEDICATIONS:  1. Bisoprolol/hydrochlorothiazide 10/6.25 daily.  2. Lipitor 40 mg daily.  3. Coumadin.  4. Potassium 20 mEq daily.  5. Enalapril 20 mg daily.  6. Niaspan 500 mg daily.  7. Fish oil.   REVIEW OF SYSTEMS:  As stated in the HPI and otherwise negative for  other systems.   PHYSICAL EXAMINATION:  GENERAL:  The patient is in no distress.  VITAL SIGNS:  Blood pressure 140/82, heart rate 70 and regular, weight  177  pounds, and body mass index 24.  HEENT:  Eyes unremarkable.  Pupils are equal, round, and reactive to  light.  Fundi not visualized.  Oral mucosa unremarkable.  NECK:  No jugular venous distention at 45 degrees.  Carotid upstroke  brisk and symmetrical.  No bruits.  No thyromegaly.  LYMPHATICS:  No cervical, axillary, or inguinal adenopathy.  LUNGS:  Clear to auscultation bilaterally.  BACK:  No costovertebral angle tenderness.  CHEST:  Unremarkable.  A well-healed sternotomy scar.  HEART:  PMI not displaced or sustained.  S1 and S2 within normal limits.  No S3.  No S4.  A 2/6 apical systolic murmur heard slightly at the  axilla.  No diastolic murmurs.  ABDOMEN:  Flat, positive bowel sounds, normal in frequency and pitch.  No bruits, rebound, guarding, midline pulsatile mass,  hepatomegaly, or  splenomegaly.  SKIN:  No rashes.  No nodules.  EXTREMITIES:  A 2+ pulses throughout.  Mild bilateral ankle  edema left  greater than right.  No cyanosis or clubbing.  NEUROLOGIC:  Oriented to person, place, and time.  Cranial nerves II  through XII grossly intact.  Motor grossly intact.   EKG sinus rhythm, rate 70, axis within normal limits, and intervals  within normal limits.  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   ASSESSMENT AND PLAN:  1. Coronary disease.  The patient has done well.  I have not done a      stress perfusion study on him since his bypass in 2000.  He is      overdue for this and I will order an exercise Cardiolite.  2. Cardiomyopathy.  The patient does have an ischemic cardiomyopathy.      He is maintaining the medicines as listed and has class I symptoms.      He has not wanted to consider a defibrillator, and we talked about      this again.  3. Dyslipidemia.  He has an excellent lipid profile.  He will continue      with the medications as listed.  Followed by Dr. Lysbeth Galas.  4. Hypertension.  Blood pressure is  well controlled and he will      continue the medicines as listed.  5. Apical thrombus.  I will go ahead and continue the Coumadin since      he had a large apical defect and dyskinetic area and has had no      problems with the Coumadin.  6. Followup.  We will see him back in 1 year provided he has normal      stress perfusion study.     Rollene Rotunda, MD, [facility]  Electronically Signed    JH/MedQ  DD: 05/27/2008  DT: 05/28/2008  Job #: [id number]   cc:   Delaney Meigs, M.D.

## 2011-04-11 NOTE — Assessment & Plan Note (Signed)
[facility]                            CARDIOLOGY OFFICE NOTE   NAME:Joseph [name], Joseph [name]                        MRN:          [id number]  DATE:05/15/2007                            DOB:          [birth year]    PRIMARY CARE PHYSICIAN:  Delaney Meigs, M.D.   REASON FOR PRESENTATION:  Evaluate patient with ischemic cardiomyopathy.   HISTORY OF PRESENT ILLNESS:  The patient returns for yearly follow-up.  He is now 55 years old.  He has done well since I last saw him.  He has  had no chest discomfort, neck or arm discomfort.  He has had no  palpitations, presyncope or syncope.  He has had no PND or orthopnea.  He tolerates his medicines well.  He is walking for exercise.   PAST MEDICAL HISTORY:  1. Coronary artery disease, status post CABG (LIMA to the LAD, SVG to      obtuse marginal, sequential to distal circumflex, SVG to PDA and      distal circumflex, SGA to diagonal).  2. Previous apical thrombus.  3. Dyslipidemia.  4. Hypertension.  5. Cardiomyopathy (EF 30% with apical akinesis).   ALLERGIES:  None.   MEDICATIONS:  1. Bisoprolol/HCT 10/6.25 mg daily.  2. Lipitor 40 mg daily.  3. Coumadin as directed.  4. Potassium 20 mEq daily.  5. Enalapril 20 mg daily.   REVIEW OF SYSTEMS:  As stated in the HPI and otherwise negative for  other systems.   PHYSICAL EXAMINATION:  GENERAL:  The patient is in no distress.  VITAL SIGNS:  Blood pressure 136/78, heart rate 61 and regular, weight  173 pounds.  HEENT:  Eyelids unremarkable.  Pupils equal, round, and reactive to  light.  Fundi not visualized.  Oral mucosa unremarkable.  NECK:  No jugular venous distention at 45 degrees.  Carotid upstroke  brisk and symmetric.  No bruits, no thyromegaly.  LYMPHATIC:  No cervical, axillary or inguinal adenopathy.  LUNGS:  Clear to auscultation bilaterally.  BACK:  No costovertebral angle tenderness.  CHEST:  Unremarkable.  HEART:  PMI not displaced or  sustained, S1 and S2 within normal limits  with no S3, no S4, no clicks, no rubs, no murmurs.  ABDOMEN:  Flat, positive bowel sounds, normal in frequency and pitch, no  bruit, no rebound, no guarding, no midline pulsatile mass, no  hepatomegaly, no splenomegaly.  SKIN:  No masses, no nodules.  EXTREMITIES:  2+ pulses throughout, no edema, no cyanosis, no clubbing.  NEUROLOGIC:  Oriented to person, place and time.  Cranial nerves II-XII  grossly intact.  Motor grossly intact.   EKG:  Sinus rhythm, rate 61, axis within normal limits, intervals within  normal limits, old anterior septal myocardial infarction, no acute ST-T  wave changes, persistent anterior ST segment elevation unchanged from  previous.   ASSESSMENT AND PLAN:  1. Coronary disease.  The patient is having no symptoms.  No further      cardiovascular testing is suggested.  He will continue his      secondary risk reduction.  2. Dyslipidemia.  The  patient is having this followed by his primary      care doctor.  The goal is an LDL of less than 100 and HDL in the      40s.  He is actually close with this and was told to increase his      exercise because his HDL is only 36.  I agree with this.  3. Cardiomyopathy.  He has class I symptoms.  We have discussed that      there is no absolute indication for the Coumadin.  There is a      question as to the benefit.  There is actually an ongoing study      that is not yet completed to answer this question.  He at this      point wants to continue the Coumadin for the potential benefit,      understanding the risk of bleeding complications.  He has had no      issues with the Coumadin.  He gets followed closely.  Again, this      has been an informed decision on his part.  4. Again I talked to him about the benefits of an implantable      cardioverter-defibrillator and he does not want one.   FOLLOW-UP:  I will see him back in 1 year or sooner if needed.     Rollene Rotunda,  MD, [facility]  Electronically Signed    JH/MedQ  DD: 05/15/2007  DT: 05/16/2007  Job #: [id number]   cc:   Delaney Meigs, M.D.

## 2011-04-14 NOTE — Consult Note (Signed)
[name], Joseph [name]                 ACCOUNT NO.:  [id number]   MEDICAL RECORD NO.:  [id number]          PATIENT TYPE:  INP   LOCATION:  A213                          FACILITY:  [facility]   PHYSICIAN:  Cecil Cranker, M.D.DATE OF BIRTH:  1956-03-11   DATE OF CONSULTATION:  DATE OF DISCHARGE:                                   CONSULTATION   REFERRING PHYSICIAN:  Dr. Butler Denmark.   HISTORY OF PRESENT ILLNESS:  [name] is admitted to [facility] on  June 01, 2005 with complaints of nausea, vomiting, diarrhea and abdominal  distention times 2 days.  He had an abdominal x-ray done in the emergency  department that apparently revealed a small bowel obstruction per the H&P.  The patient has been treated with bowel rest, hydration, Protonix and  Phenergan as needed.  He reports improvement in his symptoms since  admission.  He did have a formed stool this morning and has had no further  nausea or vomiting and reports decreased abdominal distention.  He denies  any chest discomfort, shortness of breath, dyspnea on exertion, orthopnea,  PND or peripheral edema.   PAST MEDICAL HISTORY:  Coronary artery disease, status post coronary artery  bypass grafting in 2000 with the following grafts:  LIMA to LAD, SVG to OM  and sequential distal circumflex, SVG to PDA and distal circumflex, SBG to  diagonal.   The patient also has a history of depressed LV function with an EF of 30% by  echocardiogram in December of 2005.  The patient also has a history of  nonsustained ventricular tachycardia.  However, he has declined AICD in the  past.  He has a history of hypertension, dyslipidemia and a history of  apical thrombus, for which he was started on Coumadin therapy.  He has  continued on Coumadin therapy for his LV dysfunction.   MEDICATIONS PRIOR TO ADMISSION:  1.  Lipitor 40 mg daily.  2.  KCl  20 mEq daily.  3.  Coreg 25 mg b.i.d.  4.  Lasix 20 mg daily.  5.  Enalapril 10 mg b.i.d.  6.   Coumadin as directed.  7.  Phenergan as needed.  8.  Imodium as needed.   No known drug allergies.   MEDICATIONS IN THE HOSPITAL:  1.  Protonix 40 mg IV daily.  2.  IV normal saline at 100 mL per hour.  3.  Phenergan as needed.   SOCIAL HISTORY:  [name] lives in [place] with his wife.  He works  doing Runner, broadcasting/film/video.  He has two daughters and three grandchildren.  He  has a 15-pack-year smoking history with cessation 20 years ago.  He denies  any alcohol or illicit drug use.   FAMILY HISTORY:  Mother is deceased in her 72s secondary to diabetes  complications.  Father is alive in his 48s with coronary disease.  He has  one brother who is alive with unknown medical history.   REVIEW OF SYSTEMS:  Positive for subjective fevers.  No chills.  No upper  respiratory symptoms.  No rashes or lesions  are noted.  CARDIOPULMONARY:  Per HPI.  GU:  No frequency or dysuria.  NEURO/PSYCH:  No weakness or  numbness.  MUSCULOSKELETAL:  No myalgias or arthralgias.  GI:  As per HPI.  All other systems reviewed are negative.   PHYSICAL EXAMINATION:  VITAL SIGNS:  Temperature is 98.6, pulse 81,  respirations 18, blood pressure 107/68.  Weight is 168 pounds.  GENERAL:  This is a well-developed, well-nourished male in no acute  distress.  HEENT:  Normocephalic, atraumatic.  The pupils are equal, round and reactive  to light.  NECK:  Reveals no jugular venous distention.  No carotid bruits.  No  lymphadenopathy is noted.  CARDIOVASCULAR:  Regular rate and rhythm.  Normal S1 and S2.  No S3 or S4.  No murmurs are appreciated.  LUNGS:  Clear to auscultation bilaterally.  Mildly decreased breath sounds  in the bases bilaterally.  SKIN:  Warm and dry with no obvious rashes.  ABDOMEN:  Mildly distended with decreased bowel sounds.  No rebound or  guarding is noted.  His stomach is soft and not tender.  GU:  Exam deferred.  RECTAL:  Exam deferred.  EXTREMITIES:  Revealed no cyanosis, clubbing or  edema.  Distal pulses are  intact bilaterally.  MUSCULOSKELETAL:  No joint deformity or effusions are noted.  NEUROLOGIC:  He is alert and oriented times three.  Cranial nerves are  grossly intact.   No electrocardiogram has been done this admission.   LABORATORY DATA:  White blood cells 7.7, hemoglobin 13.6, hematocrit 39.5,  platelets 209, sodium 136, potassium 3.4, chloride 102, CO2 27, BUN 11,  creatinine 1.2, glucose 97.  Total bilirubin 0.8, alkaline phosphatase 84,  AST 18, ALT 16, amylase 76, lipase 20, total protein 6.2, albumin 3.2, BNP  level less than 30.  Urinalysis with pH of 5.5, specific gravity 1.025.  White blood cells 0-2, red blood cells 0-2, leukocyte esterase and nitrite  negative.  Protein trace.  Glucose negative.  Ketones negative.   IMPRESSION/PLAN:  1.  Coronary artery disease, status post coronary artery bypass grafting in      2000 with ischemic cardiomyopathy with an EF of 25-30%, currently with      no cardiac symptoms, a history of nonsustained ventricular tachycardia.      However, he has declined AICD.  Would restart his cardiac medications      when able to tolerate p.o. intake.  Plan for a stress test when stable.  2.  Coumadin therapy for his LV dysfunction and history of apical thrombus.      If his INR is subtherapeutic would cover with Lovenox and restart his      Coumadin when he is tolerating p.o. intake.  3.  Gastrointestinal issues.  Further evaluation and treatment per the      primary team.  4.  Hypertension, well controlled.  5.  Dyslipidemia.  Continue Lipitor when tolerating p.o. intake.   The patient was interviewed and examined by Dr. Glennon Hamilton.  He agrees with  the above assessment and plan.  Primary cardiology is Dr. Antoine Poche, and  primary physician is Dr. Delaney Meigs.  We appreciate this consult and  will be happy to follow this patient along with you while he is in the  hospital.      Amy B  AB/MEDQ  D:   06/02/2005  T:  06/02/2005  Job:  [id number]

## 2011-04-14 NOTE — Procedures (Signed)
NAMEOMARIUS, GRANTHAM                 ACCOUNT NO.:  [id number]   MEDICAL RECORD NO.:  [id number]          PATIENT TYPE:  OUT   LOCATION:  RAD                           FACILITY:  [facility]   PHYSICIAN:  [place] Bing, M.D.  DATE OF BIRTH:  04-16-1956   DATE OF PROCEDURE:  11/04/2004  DATE OF DISCHARGE:  11/04/2004                                  ECHOCARDIOGRAM   REFERRING PHYSICIAN:  Dr. Veatrice Kells.   IMPRESSION:  1.  Technically suboptimal but adequate echocardiographic study.  2.  Mild-to-moderate left atrial enlargement.  Normal right atrial size.      Normal right ventricular size.  Borderline hypertrophy.  Normal      function.  3.  Mild aortic valvular sclerosis.  No significant abnormalities of the      mitral nor tricuspid valves.  4.  Mild left ventricular dilatation, severe hypokinesis-to-akinesis of the      inferior wall.  Moderate anterior hypokinesis, moderate-to-severe      hypokinesis of the distal septum and apex.  Overall left ventricular      systolic function is moderately to markedly impaired with an estimated      ejection fraction of 0.30.     Robe   RR/MEDQ  D:  11/09/2004  T:  11/09/2004  Job:  [id number]

## 2011-04-14 NOTE — Discharge Summary (Signed)
NAMEMARQUIN, PATINO                 ACCOUNT NO.:  [id number]   MEDICAL RECORD NO.:  [id number]          PATIENT TYPE:  INP   LOCATION:  A218                          FACILITY:  [facility]   PHYSICIAN:  Osvaldo Shipper, MD     DATE OF BIRTH:  09/04/1956   DATE OF ADMISSION:  03/02/2006  DATE OF DISCHARGE:  LH                                 DISCHARGE SUMMARY   DISCHARGE DIAGNOSES:  1.  Small-bowel obstruction, resolving.  2.  History of coronary artery disease, stable.  3.  History of left ventricular dysfunction on chronic Coumadin, stable.   Please refer to H&P dictated at time of admission for details regarding the  patient's presenting illness.   BRIEF HISTORY:  Briefly, a 55 year old Caucasian male with history of  coronary artery disease, LV dysfunction on chronic Coumadin who was doing  well until the night before admission when he experienced some nausea but no  emesis.  The following day he had diarrhea followed by abdominal distention  and pain.  The patient was found to have evidence for a small-bowel  obstruction.  He was seen by Dr. Katrinka Blazing and put on conservative management.  Did not require NG tube.  Kept n.p.o.  Serial abdominal films were done  which showed resolution of his small-bowel obstruction.  Etiology was likely  from toxic gastroenteritis per Dr. Katrinka Blazing.  The patient will need a  colonoscopy which will be arranged by him the next few days.  The patient  has done well this admission.  He has always been keen to go home.  Even in  the ER he wanted to go home and was very reluctant to be admitted.  The  patient has been asked to continue a soft diet for the next 2 days and then  advanced as tolerated.   Coronary artery disease remained stable during this admission.  He was on  Coumadin for LV dysfunction.  He had an apical thrombus years ago when he  was diagnosed with CAD.  His Coumadin was held as there was the possibility  of surgery.  However, he did not require  surgery.  He has been asked to  resume his Coumadin.  His INR today is 1.7.  Since his thromboembolic event  was many years ago he will not need any heparin bridging therapy at this  time.   DISCHARGE MEDICATIONS:  No changes are being made to any of his medications.  Please see H&P for a complete list.   FOLLOWUP:  1.  With Dr. Lysbeth Galas as needed.  2.  With Dr. Katrinka Blazing as needed.  3.  A colonoscopy to be scheduled with Dr. Jena Gauss per patient preference by      the patient himself.   PT/INR to be checked by end of this week.   Diet is soft diet for the next 2 days and then advance to solid as  tolerated.   He may return to work on Wednesday, March 07, 2006.   Physical activity as before.   Imaging studies done include CT/abdominal films which showed SBO and  resolution.   On day of discharge, vital signs stable.  He has been tolerating a full  liquid diet.  He has been having about three to four BMs a day but they are  getting solidified.  He has not had any nausea or vomiting.  His vital signs  remain stable.  Considered stable for discharge.      Osvaldo Shipper, MD  Electronically Signed     GK/MEDQ  D:  03/05/2006  T:  03/05/2006  Job:  [id number]   cc:   Delaney Meigs, M.D.  Fax: [phone]   Dirk Dress. Katrinka Blazing, M.D.  Fax: [phone]   R. Roetta Sessions, M.D.  [address]  [place]  [place] [address]

## 2011-04-14 NOTE — H&P (Signed)
Joseph [name], Joseph [name]                 ACCOUNT NO.:  [id number]   MEDICAL RECORD NO.:  [id number]          PATIENT TYPE:  INP   LOCATION:  A213                          FACILITY:  [facility]   PHYSICIAN:  Calvert Cantor, M.D.     DATE OF BIRTH:  [birth year]   DATE OF ADMISSION:  06/01/2005  DATE OF DISCHARGE:  LH                                HISTORY & PHYSICAL   A patient of Dr. Lysbeth Galas.   PRESENTING COMPLAINT:  Abdominal distention.   HISTORY OF PRESENT ILLNESS:  This is a 55 year old white male who states  that on Tuesday he began to have diarrhea with nausea and vomiting.  He saw  a primary care physician and received prescriptions for Phenergan and  Imodium.  On Wednesday, the diarrhea continued.  The patient took some  Imodium which helped relieve the diarrhea, but his abdomen began to distend.  On Thursday, he no longer had any diarrhea but continued to have some  vomiting.  He said his abdomen distended so tightly that he was unable to  sit up and had some trouble breathing.  That is why he came into the ER.  In  the ER, he had some vomiting which was relieved with IV Phenergan.  Since  then, he said he felt better.  He has not had any appetite and has not been  eating since Tuesday.  However, he has been drinking liquids and especially  trying to drink some Gator-Ade.  He has had no fevers.  Temperature was 99  degrees maximum.  He has not noticed any blood in his stool or in his  vomitus.  Other review of systems is negative for headache, chest pain,  dysuria.   PAST MEDICAL HISTORY:  Significant for coronary artery disease.  He had a  CABG 6 years ago.  He has hypertension and hypercholesterolemia.   ALLERGIES:  No known drug allergies.   MEDICATIONS:  1.  Lipitor 40 mg daily.  2.  Phenergan 25 mg p.r.n.  3.  Imodium.  The patient states he took 2 tabs.  4.  Potassium 20 mEq daily.  5.  Coreg 25 mg b.i.d.  6.  Enalapril 10 mg b.i.d.  7.  Lasix 20 mg daily.  8.  Coumadin 7.5  mg daily.   PAST SURGICAL HISTORY:  CABG 6 years ago.   SOCIAL HISTORY:  The patient was a smoker for about 10-15 years, smoking  about one pack per day.  He stopped more than 20 years ago.  He is married  with two girls and three grandchildren, all alive and healthy.   FAMILY HISTORY:  His father is alive.  He had a CABG 6 years ago.  He has a  history of renal stones.  His mother passed away.  She had a history of  diabetes.   PHYSICAL EXAMINATION:  VITAL SIGNS:  Temperature is 99.1, blood pressure  122/85, pulse 96, respiratory rate 22.  Pulse oximetry is 96% on room air.  HEENT:  Atraumatic, normocephalic.  The pupils are equal, round and reactive  to light and  accommodation.  Extraocular muscles are intact.  The oral  mucosa is moist.  No lesions are present.  NECK:  Supple.  HEART:  Regular rate and rhythm.  No murmurs.  LUNGS:  Clear bilaterally.  ABDOMEN:  Slightly firm, distended.  Bowel sounds are decreased.  There is  some tenderness diffusely.  The abdomen is tympanitic to percussion.  EXTREMITIES:  No cyanosis, clubbing or edema.  Pedal pulses are positive.   BLOOD WORK:  The white count is 8.9, hemoglobin 15.6, hematocrit 45, MCV  88.4, platelets 240.  Sodium 134, potassium 3.8, chloride 102, bicarb 23,  glucose 136, BUN 12, creatinine 1.1, total bilirubin 0.8, alkaline  phosphatase 84, AST 18, ALT 16, albumin 3.2, calcium 8.3, amylase 76, lipase  20.  BNP is less than 30.  UA shows a small amount of bilirubin and trace  amount of protein.  Otherwise it is normal.   Abdominal x-ray has not yet been reported, but it has been communicated to  me by the ER doctor that it is showing a small bowel obstruction.   ASSESSMENT/PLAN:  This is a 55 year old white male who has had a bout of  nausea, vomiting and diarrhea, most likely secondary to gastroenteritis.  He  has a Small bowel obstruction, most likely, an ileus which started 2 days  ago when Immodium was started.  The  ER doctor has spoken with Dr. Katrinka Blazing, who  is suggesting currently conservative management and observation to look for  improvement.  Therefore the patient will be reassessed in the morning.  Currently he will be kept n.p.o. and given normal saline and Phenergan.  He  will have cultures done if he is to develop a fever.  He will be placed on  Protonix IV.       SR/MEDQ  D:  06/02/2005  T:  06/02/2005  Job:  [id number]   cc:   Delaney Meigs, M.D.  [address]  [place]  [place] [address]  Fax: [phone]

## 2011-04-14 NOTE — Discharge Summary (Signed)
Joseph [name], Joseph [name] NO.:  [id number]   MEDICAL RECORD NO.:  [id number]          PATIENT TYPE:  INP   LOCATION:  A213                          FACILITY:  [facility]   PHYSICIAN:  Vania Rea, M.D. DATE OF BIRTH:  11/19/1956   DATE OF ADMISSION:  06/02/2005  DATE OF DISCHARGE:  07/09/2006LH                                 DISCHARGE SUMMARY   PRIMARY CARE PHYSICIAN:  Dr. Ralene Ok. Nyland.   DISCHARGE DIAGNOSES:  1.  Acute gastroenteritis, resolved.  2.  Subacute small bowel obstruction, improved.  3.  A history of coronary artery disease.  4.  A history of hypertension.  5.  A history of hypercholesterolemia.   DISPOSITION:  Discharge to home.   DISCHARGE CONDITION:  Stable.   DISCHARGE MEDICATIONS:  Unchanged from admission and include:  1.  Lipitor 40 mg each evening.  2.  Coreg 25 mg twice daily.  3.  Enalapril 10 mg twice daily.  4.  Coumadin 7.5 mg each evening.  5.  Lasix 20 mg daily.  6.  Potassium 20 mEq daily.   HOSPITAL COURSE:  Please refer to the admission history and physical.  This  is a 55 year old Caucasian man who visited his doctor for an episode of  acute gastroenteritis and was prescribed Phenergan and Imodium.  Subsequently he began to have progressive abdominal distention and came to  the emergency room, where an x-ray of the abdomen revealed a small bowel  obstruction.  After discussion with the surgeon in the emergency room, the  patient was admitted for conservative management of small bowel obstruction.  The patient was placed on initially n.p.o. and then on a liquid diet,  advancing slowly, and was able to tolerate this diet without difficulty.  This morning the patient was tolerating a liquid diet but was still having  some mild abdominal distention.  The decision was taken to do a CT scan for  further evaluation.  The patient had a CT scan of the abdomen and pelvis  with and without contrast which showed mild abdominal  distention.  No  evidence of mass or obstruction, probably an adynamic ileus.  It is felt  that the patient is well enough for discharge, to be followed up by his  primary care physician.  In all likelihood this is ileus related to Imodium  use.  The patient has been advised that if there is any further abdominal  pain or abdominal distention, he is to return to the emergency room.   LABORATORY:  This morning his white count is 6.1 hemoglobin 12.7, hematocrit  35.8, platelets 191.  His sodium is 140, potassium 3.6, chloride 108, CO2  29, glucose 85, BUN 3, creatinine 1.  His INR is 2.  His calcium is 8.3.  His liver function tests are unremarkable.  His amylase is 76.  His lipase  is 20.   A CT scan of the abdomen and pelvis this morning showed:  1.  Dilated proximal small bowel loops without discrete transition, favoring      partial small bowel obstruction over adynamic ileus.  2.  A small amount of perihepatic and perisplenic ascites of uncertain      etiology.  3.  Bilateral pleural effusion.   CT scan of the pelvis revealed no CT evidence of appendicitis and a small  amount of free fluid in the pelvis.   FOLLOWUP:  With Dr. Lysbeth Galas in 1 week.       LC/MEDQ  D:  06/04/2005  T:  06/04/2005  Job:  [id number]

## 2011-04-14 NOTE — H&P (Signed)
NAMETHERESA, [name] NO.:  [id number]   MEDICAL RECORD NO.:  [id number]          PATIENT TYPE:  EMS   LOCATION:  ED                            FACILITY:  [facility]   PHYSICIAN:  Joseph [name], M.D. DATE OF BIRTH:  [birth year]   DATE OF ADMISSION:  06/06/2005  DATE OF DISCHARGE:  LH                                HISTORY & PHYSICAL   PRIMARY CARE PHYSICIAN:  Dr. Ralene Ok. [name].   CHIEF COMPLAINT:  Progressive abdominal swelling for 2 days.   HISTORY OF PRESENT ILLNESS:  This is a 55 year old Caucasian man who was  recently discharged from this hospital 2 days ago after treatment for a  partial subacute small bowel obstruction which had resolved on a clear  liquid diet.  At that time, the patient gave a history of gastroenteritis,  treated with Imodium.  A small bowel obstruction was presumed to be related  to Imodium.  The patient was discharged home in satisfactory condition and  advised to return to the emergency room if there was recurrence of the  swelling.  Prior to discharge, the patient received a CT scan of the abdomen  which failed to show any discrete obstruction.  The patient has returned  today complaining of progressive abdominal swelling, although he continues  to have bowel movements and although the swelling is accompanied by only  rather minor pains.  The patient has been taking no further Imodium but has  been taking Mylanta.  He has been having no fever.  He has no history of  hernia.  He has no history of prior abdominal surgery.  He has never had a  colonoscopy.   PAST MEDICAL HISTORY:  1.  Recent vomiting and diarrhea presumed to be gastroenteritis.  2.  Recent 2-day hospital stay for subacute small bowel obstruction.  3.  A history of coronary artery disease.  4.  A history of hypertension.  5.  A history of hypercholesterolemia.   PAST SURGICAL HISTORY:  CABG 6 years ago.   MEDICATIONS:  1.  Lipitor 40 mg each evening.  2.  Coreg  25 mg twice daily.  3.  Enalapril 10 mg twice daily.  4.  Coumadin 7.5 mg each evening.  5.  Lasix 20 mg daily.  6.  Potassium 20 mEq daily.   ALLERGIES:  No known drug allergies.   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  He works in  [employer], broadcasting/film/video.  He lives in [place].  He has two daughters, three  grandchildren and a 15-pack-year smoking history.  Discontinued smoking 20  years ago.  Denies alcohol or illicit drug use.   FAMILY HISTORY:  Significant for a mother who died in her 57s with  complications of diabetes, a father who is alive in his 24s and one brother  with an unknown medical history.   REVIEW OF SYSTEMS:  A 10-point review of systems showed no acute findings  other than previously discussed.   PHYSICAL EXAMINATION:  GENERAL APPEARANCE:  This is a pleasant Caucasian man  lying in  bed in no acute distress, although his abdomen is obviously  markedly distended.  VITAL SIGNS:  Temperature pending, pulse pending, blood pressure pending,  respiratory rate pending.  HEENT:  His pupils are round, equal and reactive.  Mucous membranes are pink  and anicteric.  He has poor dentition.  No lymphadenopathy or thyromegaly.  CHEST:  Clear to auscultation bilaterally.  CARDIOVASCULAR SYSTEM:  Regular rhythm.  ABDOMEN:  Diffusely distended but nontender, tympanitic.  No masses.  In his  groin he has no observed cough impulse.  No evidence of hernia.  EXTREMITIES:  Without edema or deformities.  Pulses are 3+ bilaterally.   LABORATORY:  His white count is 11.8, hemoglobin 14.9, hematocrit 43.6,  platelets 264.  His absolute neutrophil count is elevated at 10.8.  His  protime is 21.8.  His INR is 1.9.  His sodium is 148, potassium 3.6,  chloride 103, CO2 29, glucose 117, BUN 6.  His creatinine is 1.1.  His  calcium is 8.7.  His abdominal films reveal a moderate to high-grade small  bowel obstruction.   PLAN:  We will admit this gentleman and keep him  n.p.o., continue his usual  medications except Lasix and potassium.  We will place an NG tube to  intermittent suction.  We will hold his Coumadin and put him on heparin if  necessary and have a surgical consultation with Dr. Katrinka [name].       LC/MEDQ  D:  06/06/2005  T:  06/06/2005  Job:  [id number]

## 2011-04-14 NOTE — Discharge Summary (Signed)
Joseph [name], Joseph [name]                 ACCOUNT NO.:  [id number]   MEDICAL RECORD NO.:  [id number]          PATIENT TYPE:  INP   LOCATION:  A301                          FACILITY:  [facility]   PHYSICIAN:  Calvert Cantor, M.D.     DATE OF BIRTH:  25-Sep-1956   DATE OF ADMISSION:  06/06/2005  DATE OF DISCHARGE:  07/16/2006LH                                 DISCHARGE SUMMARY   DISCHARGE DIAGNOSES:  1.  Paralytic ileus.  2.  Coronary artery disease.  3.  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  Currently, INR is      subtherapeutic.  4.  History of hypertension.  5.  History of hypercholesterolemia.   DISCHARGE MEDICATIONS:  1.  The patient's Coumadin has been increased to 15 mg daily since INR has      been subtherapeutic.  2.  Zelnorm 6 mg before breakfast and dinner which is a new medication.  3.  Reglan 10 mg before each meal and at bedtime.  4.  Mylicon every 4-6 hours for gas.  5.  The patient's old medications include Lipitor 40 mg daily.  6.  Coreg 25 mg b.i.d.  7.  Enalapril 10 mg b.i.d.  8.  Lasix 20 mg daily.  9.  Potassium 20 mEq daily.   HOSPITAL COURSE:  This is a 55 year old white male who was admitted for  abdominal distention and small bowel obstruction on June 02, 2005.  As this  was noted to improve, he was discharged on June 04, 2005.  However, the  distention became worse, and the patient returned to the ER on June 06, 2005, and was readmitted.  During his stay, the patient has been on clear  liquids most of the time, and his abdominal distention has been improving.  He was switched over to full liquids yesterday, and this morning he had a  regular diet prescribed, and he is being discharged now after lunch which he  has tolerated.  His abdomen is not distending again.  He has had no  complaints of nausea or vomiting during his stay, and he has been having  continuous bowel movements as well.   RADIOLOGY:  An abdominal series done on the day of admission, June 06, 2005,  showed moderate-to-high grade small bowel obstruction.  The following day,  it showed little change.  On June 09, 2005, a slight improvement was noted,  and on June 10, 2005, further improvement was noted.   PHYSICAL EXAMINATION:  ABDOMEN:  The patient's abdomen is soft,  nondistended, nontender.  Bowel sounds are positive.   BLOOD WORK:  Most recent blood work was done on June 10, 2005 which showed a  WBC of 5800,  hemoglobin 12.9, hematocrit 37.0, platelets 215,000.  PT is  16.8, INR is 1.3.  Sodium 140, potassium 4.0, chloride 110, bicarb 27,  glucose 80, BUN 1.0, creatinine 1.1, calcium 8.4.  Amylase was 76.      Calvert Cantor, M.D.  Electronically Signed     SR/MEDQ  D:  06/12/2005  T:  06/12/2005  Job:  [id number]

## 2011-04-14 NOTE — H&P (Signed)
Joseph [name], Joseph [name]                 ACCOUNT NO.:  [id number]   MEDICAL RECORD NO.:  [id number]          PATIENT TYPE:  EMS   LOCATION:  ED                            [facility]AC[facility]   PHYSICIAN:  Osvaldo Shipper, MD     DATE OF BIRTH:  Jun 03, 1956   DATE OF ADMISSION:  03/02/2006  DATE OF DISCHARGE:  LH                                HISTORY & PHYSICAL   PRIMARY CARE PHYSICIAN:  Dr. Lysbeth Galas   The patient has been seen by Dr. Elpidio Anis in the past.   ADMITTING DIAGNOSES:  1.  Small bowel obstruction, etiology unclear.  2.  History of coronary artery disease.  3.  History of chronic anticoagulation for Left ventricular dysfunction.   CHIEF COMPLAINT:  Abdominal bloating since this morning.   HISTORY OF PRESENT ILLNESS:  The patient is a 55 year old Caucasian male  with a history of coronary artery disease, 1 episode of SBO last year, who  was doing well until actually last night when he was eating dinner at a  [employer], and he felt sick but did not have any nausea or vomiting  at that time.  This morning he woke up, and he started having diarrhea which  was described as brownish-colored, watery stool with no blood.  He had about  6-7 episodes.  He also had 2 episodes of emesis, no blood in that as well.  His last emesis was at 10:30 this morning.  His last diarrhea episode was at  11:00 this morning.  After the onset of diarrhea, he felt his abdomen to be  tight.  He felt quite unwell and decided to come into the ED.  He felt warm  earlier today, had 1 episode of chills but cannot give history of any fever.  He has had no surgeries to his abdomen in the past.  As mentioned above, he  had 1 episode of SBO back in July 2006, for which again no clear cut  etiology was found.   MEDICATIONS AT HOME:  1.  Coreg 25 mg twice daily.  2.  Coumadin 5 mg on Monday, 7.5 the rest of the days.  3.  Enalapril 20 mg once daily.  4.  Lasix 20 mg once daily.  5.  Lipitor 40 mg once  daily.  6.  Potassium chloride 10 mEq twice daily.  7.  He took 1 Tylenol this morning.  8.  He does not take any Imodium.   ALLERGIES:  No known drug allergies.   PAST MEDICAL HISTORY:  1.  Coronary artery disease.  He had an MI in April 2000, following which he      had a cardiac cath, and he was immediately taken to the operating room      for a CABG procedure. No chest pains since then. History of apical      thrombus 7 years ago.  2.  History of small bowel obstruction July 2006.  3.  No other surgical history is present.   SOCIAL HISTORY:  He lives in [place] with his wife, works as a  short  distance truck driver, quit smoking more than 21 years ago.  No alcohol use,  no illicit drug use.   FAMILY HISTORY:  Significant for heart disease and diabetes.  No history of  any cancers, no colon cancers in the family.   REVIEW OF SYSTEMS:  Ten point review of systems was positive for a cough for  the past 1-2 weeks with runny nose.  He is having some clear yellow  expectoration.  But he denies any chest pain or shortness of breath.  Otherwise, denies any dysuria and hematuria.  Otherwise, the review of  systems was quite unremarkable.   PHYSICAL EXAMINATION:  VITAL SIGNS:  Temperature is 97.2, blood pressure  123/80.  For his heart rate, he is a little bit tachycardic 110-116.  Respiratory rate is about 20.  Saturations not recorded.  GENERAL:  Well-developed, well-nourished individual, slightly anxious but in  no distress.  HEENT:  There is no pallor, no icterus.  Oral mucous membrane is moist.  No  oral lesions are seen.  LUNGS:  Few crackles at the bases, otherwise clear to auscultation.  No  wheezing is appreciated.  CARDIOVASCULAR:  S1 and S2 normal.  Systolic murmur appreciated.  The aortic  area, no S3, S4.  No rubs are appreciated.  ABDOMEN:  Does not look all that distended.  No obvious deformities  observed.  Bowel sounds are scant.  Seems a little bit tense but  not  appreciably.  There is diffuse vague tenderness but no focal area of  tenderness.  No guarding, rigidity, or rebound appreciated.  No mass or  organomegaly is present.  RECTAL:  Yellow stool.  No other abnormalities noted.  EXTREMITIES:  Without edema.   LABORATORY DATA:  His CBC shows a white count of 10.2, 93% neutrophils, no  bands reported.  INR is 2.4.  CMP shows an albumin of 3.4, otherwise  unremarkable.   IMAGING STUDIES:  He did have abdominal x-ray which showed a small bowel  dilatation to 6 cm in the right abdomen, consistent with SBO, colon was  decompressed.   IMPRESSION:  This is a 55 year old Caucasian male with a history of coronary  artery disease, cardiomyopathy, 1 episode of small bowel obstruction in the  past, who presents with diarrhea, vomiting, abdominal distention, and  findings suggestive of small bowel obstruction.  Etiology is unclear at this  time.  He has never had any surgeries in the past, and so there is no  question of adhesions.  Since this is the second episode within 1 year, I am  a little bit concerned that there might be some lesion within his bowels  which could be causing his presentation.  His electrolytes are all normal.  We have checked a magnesium level as well.   PLAN:  1.  Small bowel obstruction.  I have spoken to Dr. Elpidio Anis, who has seen      the patient in the last year.  We reviewed last year's CAT scan of his      abdomen and pelvis.  He recommended the patient have an NG tube.      However, the patient is refusing this at this time.  Dr. Katrinka Blazing thinks      that the patient will need to have an exploratory laparotomy as this is      the second episode of small bowel obstruction with no clear-cut      explanation.  He will be in to see this  patient later.  2.  Cardiac issue.  The patient has history of coronary artery disease with      left ventricular dysfunction on chronic Coumadin.  It has been about 7     years since  his coronary artery disease.  He has been on Coumadin for a      long time.  If he does need to have surgery, I believe his Coumadin can      be safely discontinued without any bridging heparin therapy.  If he does      have surgery, heparin or Lovenox can be started postoperatively.  His      ejection fraction based on an echocardiogram done July 2006 is 30%.  He      did not have any significant valvular abnormalities at that time.  The      patient, I do not think, has had any recent coronary evaluations.  I am      going to obviously hold his Coumadin starting today.  If surgery is      planned for tomorrow, he should be given vitamin K to correct his INR      along with fresh frozen plasma.  If surgery is done, heparin should be      started postoperatively.   Of course, a lot of these decisions will depend on discussion and evaluation  of this patient by Dr. Katrinka Blazing.      Osvaldo Shipper, MD  Electronically Signed     GK/MEDQ  D:  03/02/2006  T:  03/02/2006  Job:  [id number]   cc:   Dirk Dress. Katrinka Blazing, M.D.  Fax: [phone]   Delaney Meigs, M.D.  Fax: [phone]   Rollene Rotunda, M.D.  [address]  [address]  [place]  [place] [address]

## 2011-04-14 NOTE — Procedures (Signed)
NAME:  Joseph [name], Joseph [name]                           ACCOUNT NO.:  [id number]   MEDICAL RECORD NO.:  [id number]                   PATIENT TYPE:  OUT   LOCATION:  RAD                                  FACILITY:  [facility]   PHYSICIAN:  [place] Bing, M.D.               DATE OF BIRTH:  1956/02/23   DATE OF PROCEDURE:  05/23/2004  DATE OF DISCHARGE:                                  ECHOCARDIOGRAM   REFERRING PHYSICIAN:  Rollene Rotunda, M.D.   CLINICAL DATA:  A 55 year old gentleman with cardiomyopathy, hypertension,  and prior CABG surgery.   MEDICAL DECISION MAKING:  Aorta 3.1, left atrium 4.4, septum 1.3, posterior  wall 1.0, LV diastole 5.6, LV systole 4.9.   1. Technically adequate echocardiographic study.  2. Mild left atrial enlargement; normal right atrium and right ventricle.  3. Normal mitral valve; mild annular calcification; mild to moderate     regurgitation.  4. Normal aortic valve; diameter of aortic root at the upper limit of     normal.  5. Normal tricuspid and pulmonic valves.  6. Mild left ventricular dilatation; global hypokinesis, most severe     inferiorly.  Overall function moderately to severely impaired with an     estimated ejection fraction of 0l.30.  7. Normal IVC.      ___________________________________________                                            [place] Bing, M.D.   RR/MEDQ  D:  05/23/2004  T:  05/23/2004  Job:  [id number]   cc:   Rollene Rotunda, M.D.

## 2011-04-14 NOTE — Procedures (Signed)
Joseph [name], DOLINGER                 ACCOUNT NO.:  [id number]   MEDICAL RECORD NO.:  [id number]          PATIENT TYPE:  INP   LOCATION:  A301                          FACILITY:  [facility]   PHYSICIAN:  [place] Bing, M.D.  DATE OF BIRTH:  11-06-1956   DATE OF PROCEDURE:  06/06/2005  DATE OF DISCHARGE:                                  ECHOCARDIOGRAM   CLINICAL DATA:  [age] gentleman with cardiomyopathy and small-  bowel obstruction.   M-MODE:  Aorta 3.2, left atrium 3.8, septum 1.6, posterior wall 1.1, LV  diastole 4.5, LV systole 3.2.   1.  Technically adequate echocardiographic study.  2.  Mild left and right atrial enlargement; normal right ventricular size;      borderline hypertrophy; normal systolic function.  3.  Normal mitral valve; very mild regurgitation.  4.  Normal aortic valve.  5.  Normal tricuspid valve.  6.  Normal pulmonic valve and proximal pulmonary artery.  7.  Mild left ventricular dilatation; borderline hypertrophy. Akinesis of      the inferior wall; moderate to severe anteroseptal hypokinesis. Overall      function is moderately impaired with an estimated ejection fraction of      0.3.  8.  Comparison with prior study of 11/04/2004: No significant interval      change.       RR/MEDQ  D:  06/06/2005  T:  06/06/2005  Job:  [id number]

## 2011-05-02 ENCOUNTER — Encounter: Payer: Self-pay | Admitting: Internal Medicine

## 2011-05-02 ENCOUNTER — Ambulatory Visit (INDEPENDENT_AMBULATORY_CARE_PROVIDER_SITE_OTHER): Payer: Self-pay | Admitting: Internal Medicine

## 2011-05-02 DIAGNOSIS — I428 Other cardiomyopathies: Secondary | ICD-10-CM

## 2011-05-02 DIAGNOSIS — I2510 Atherosclerotic heart disease of native coronary artery without angina pectoris: Secondary | ICD-10-CM

## 2011-05-02 DIAGNOSIS — I5022 Chronic systolic (congestive) heart failure: Secondary | ICD-10-CM

## 2011-05-02 DIAGNOSIS — Z95810 Presence of automatic (implantable) cardiac defibrillator: Secondary | ICD-10-CM

## 2011-05-02 NOTE — Assessment & Plan Note (Signed)
He denies anginal symptoms. He will continue his current medications. I encouraged him to increase his activity.

## 2011-05-02 NOTE — Assessment & Plan Note (Signed)
He is class II. He will continue a low sodium diet, his current medical therapy, and I encouraged him to increase his activity.

## 2011-05-02 NOTE — Progress Notes (Signed)
HPI Joseph [name] returns today for followup. He is a very pleasant 55 year old man with chronic systolic heart failure, hypertension, status post ICD implantation. The patient denies chest pain or shortness of breath. He denies noncompliance. He has had no recent ICD shocks. No Known Allergies   Current Outpatient Prescriptions  Medication Sig Dispense Refill  . aspirin 81 MG tablet Take 81 mg by mouth daily.        Marland Kitchen atorvastatin (LIPITOR) 80 MG tablet Take 80 mg by mouth daily.        . bisoprolol-hydrochlorothiazide (ZIAC) 10-6.25 MG per tablet Take 1 tablet by mouth daily.        . enalapril (VASOTEC) 20 MG tablet Take 20 mg by mouth daily.        . fish oil-omega-3 fatty acids 1000 MG capsule Take 1 g by mouth 3 (three) times daily.       . niacin (NIASPAN) 500 MG CR tablet Take 500 mg by mouth at bedtime.        . nitroGLYCERIN (NITROSTAT) 0.4 MG SL tablet Place 0.4 mg under the tongue every 5 (five) minutes as needed.        Marland Kitchen omeprazole (PRILOSEC) 20 MG capsule Take 20 mg by mouth daily.           Past Medical History  Diagnosis Date  . Left ventricular dysfunction     apical thrombus  . CHF (congestive heart failure)     EF 20%  . GI bleed     Mallory-Weiss tear EGD 02/2010  . Small bowel obstruction 2007    resolved without surgery  . Ileus 2006    required hospitalization  . Hyperlipidemia   . MRSA bacteremia   . Coronary artery disease   . 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 Jude    ROS:   All systems reviewed and negative except as noted in the HPI.   Past Surgical History  Procedure Date  . Coronary artery bypass graft     6 vessels, 2000  . Insert / replace / remove pacemaker 01/05/10    ICD insertion/St. Jude single chamber  . Basilic vein left arm resection      Family History  Problem Relation Age of Onset  . Heart attack Mother   . Emphysema Father   . Coronary artery disease Father     s/p cabg      History   Social History  . Marital Status: Married    Spouse Name: N/A    Number of Children: 3  . 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 Smoking status: Former Smoker    Quit date: 02/29/1988  . Smokeless tobacco: Never Used  . Alcohol Use: No  . Drug Use: No  . Sexually Active:    Other Topics Concern  . 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 Well-healed ICD incision. HEART:  Regular rate rhythm, no murmurs, no rubs, no clicks Abd:   positive bowel sounds,  no organomegally, no rebound, no guarding Ext:  2 plus pulses, no edema, no cyanosis, no clubbing Skin:  No rashes no nodules Neuro:  CN II through XII intact, motor grossly intact  DEVICE  Normal device function.  See PaceArt for details.   Assess/Plan:

## 2011-05-02 NOTE — Assessment & Plan Note (Signed)
His device is working normally. No ICD shocks. We'll recheck in several months.

## 2011-05-02 NOTE — Patient Instructions (Signed)
Your physician recommends that you schedule a follow-up appointment in: 1 YEAR DR. Ladona Ridgel 3 MONTHS PAULA

## 2011-06-01 ENCOUNTER — Other Ambulatory Visit: Payer: Self-pay | Admitting: *Deleted

## 2011-06-01 DIAGNOSIS — I2510 Atherosclerotic heart disease of native coronary artery without angina pectoris: Secondary | ICD-10-CM

## 2011-06-01 MED ORDER — ENALAPRIL MALEATE 20 MG PO TABS: 20.0000 mg | ORAL_TABLET | Freq: Every day | ORAL | Status: DC

## 2011-06-01 MED ORDER — NITROGLYCERIN 0.4 MG SL SUBL: 0.4000 mg | SUBLINGUAL_TABLET | SUBLINGUAL | Status: DC | PRN

## 2011-07-13 ENCOUNTER — Other Ambulatory Visit: Payer: Self-pay

## 2011-07-13 IMAGING — US US CAROTID DUPLEX BILAT
1 series · 13 of 24 positions shown · non-contrast
Comparison: None.

CLINICAL DATA: Right carotid bruit

BILATERAL CAROTID DUPLEX ULTRASOUND
TECHNIQUE: Gray scale imaging, color Doppler and duplex ultrasound
was performed of bilateral carotid and vertebral arteries in the
neck.

[Series 1: us carotid duplex bilat · 0.06mm/px · 13 of 69 slices shown]
[im 1/69]
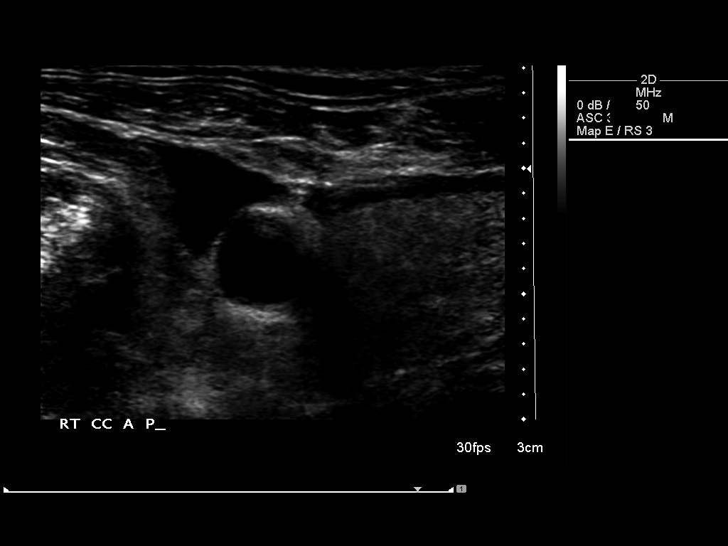
[im 6/69]
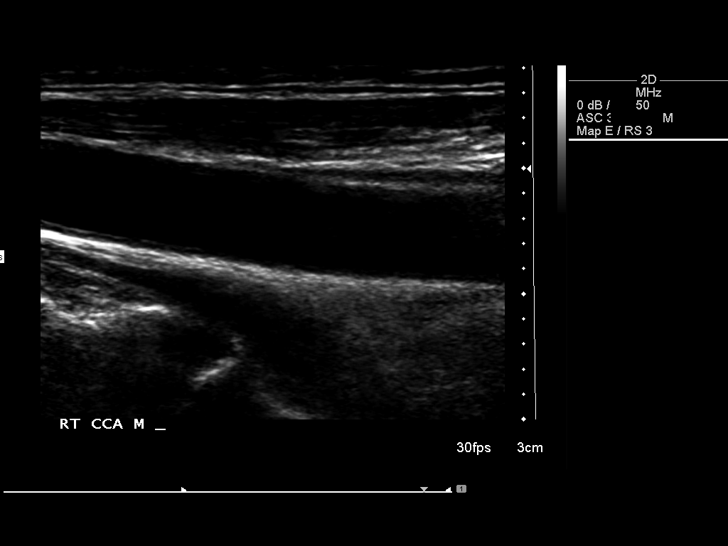
[im 12/69]
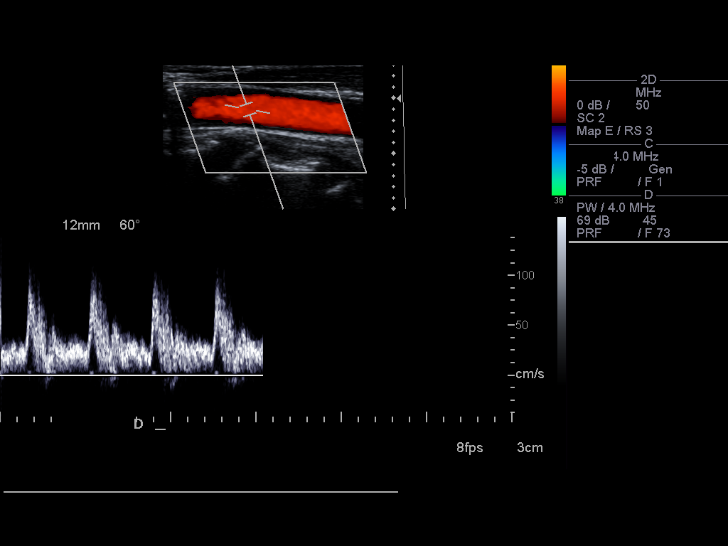
[im 18/69]
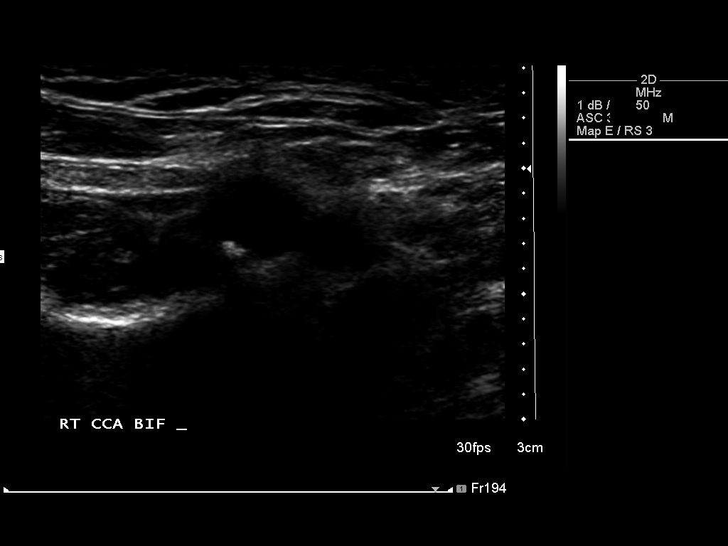
[im 24/69]
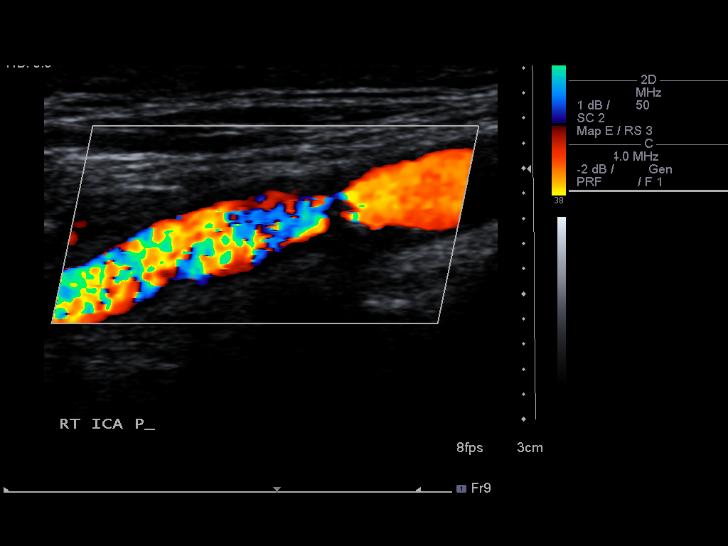
[im 30/69]
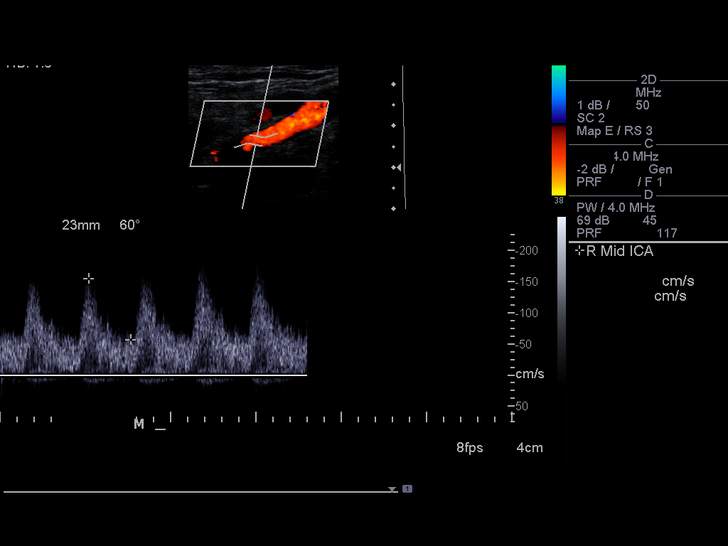
[im 36/69]
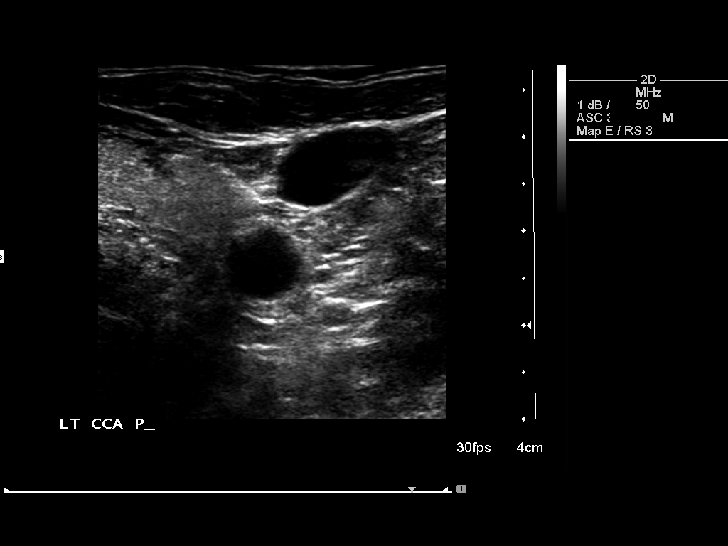
[im 39/69]
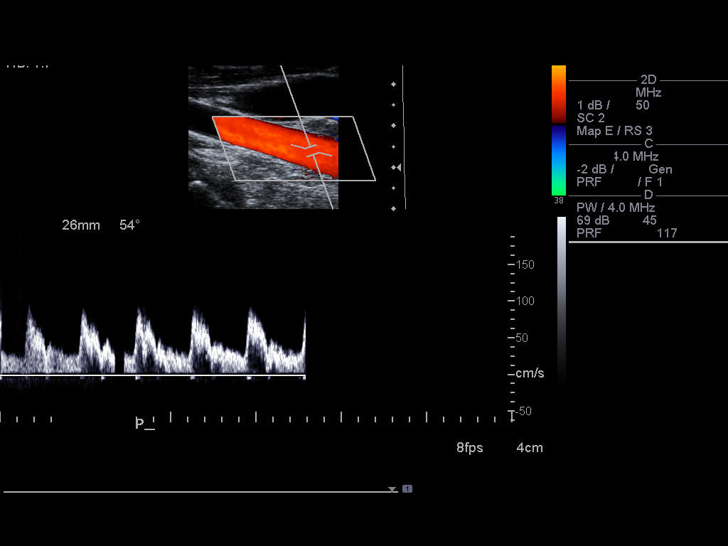
[im 45/69]
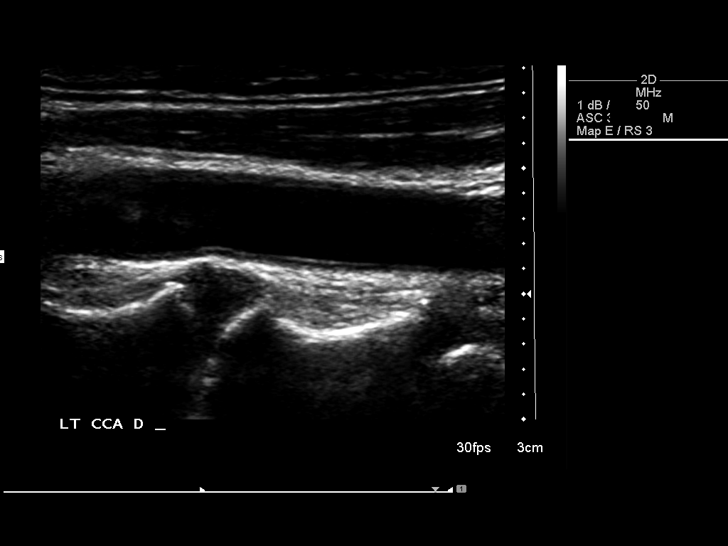
[im 51/69]
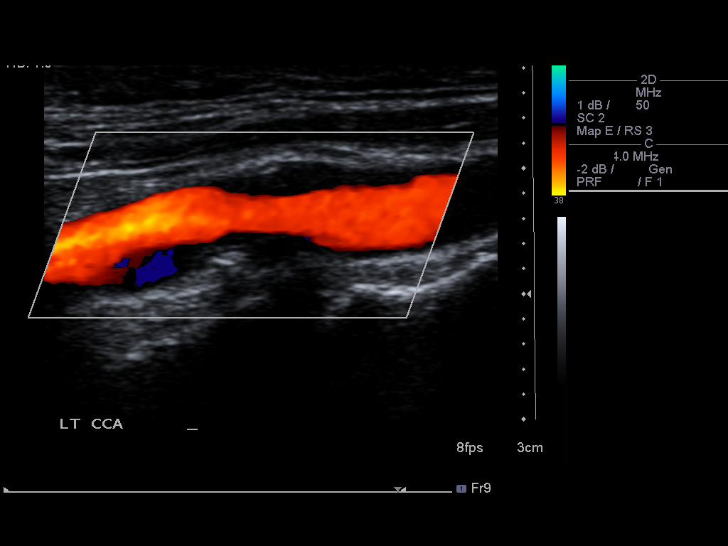
[im 57/69]
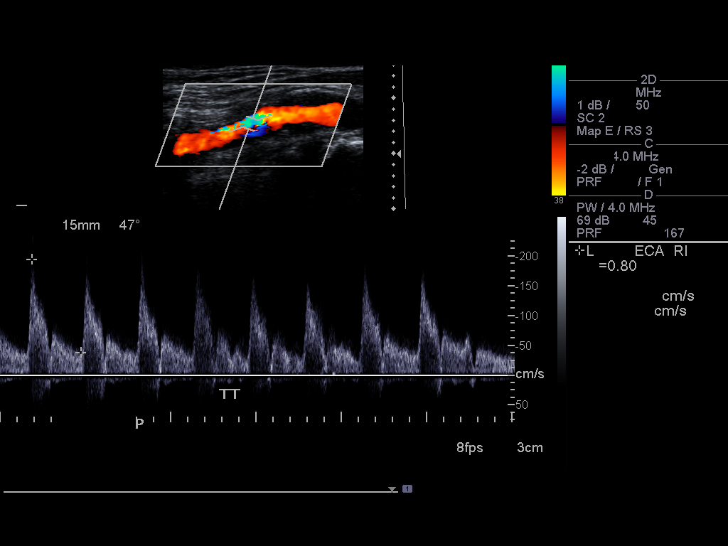
[im 63/69]
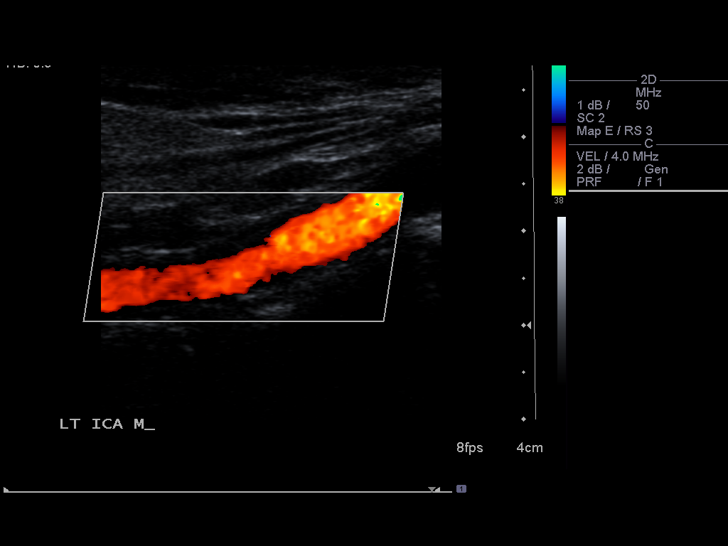
[im 69/69]
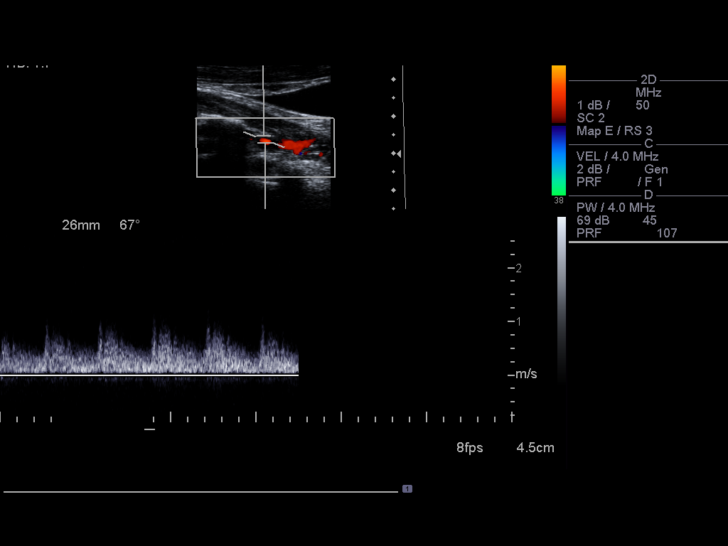

[13 of 24 positions shown; findings below may reference images not displayed]

Criteria:  Quantification of carotid stenosis is based on velocity
parameters that correlate the residual internal carotid diameter
with NASCET-based stenosis levels, using the diameter of the distal
internal carotid lumen as the denominator for stenosis measurement.

The following velocity measurements were obtained:

                 PEAK SYSTOLIC/END DIASTOLIC
RIGHT
ICA:                        199/64cm/sec
CCA:                        115/35cm/sec
SYSTOLIC ICA/CCA RATIO:
DIASTOLIC ICA/CCA RATIO:
ECA:                        146/24cm/sec

LEFT
ICA:                        168/65cm/sec
CCA:                        122/39cm/sec
SYSTOLIC ICA/CCA RATIO:
DIASTOLIC ICA/CCA RATIO:
ECA:                        195/38cm/sec
FINDINGS: RIGHT CAROTID ARTERY: Patchy moderate right carotid bifurcation
atherosclerosis and plaque formation.  Slight elevation in the
right ICA velocity measuring 199/64.  In this region, there is
narrowing of the ICA with spectral broadening and turbulent flow.
This meets criteria for a 50-70% moderate right ICA stenosis.

RIGHT VERTEBRAL ARTERY:  Antegrade

LEFT CAROTID ARTERY: Similar degree of moderate left carotid
bifurcation atherosclerosis and plaque formation.  Slight elevation
left ICA velocity measuring 168/65 with associated spectral
broadening and turbulent flow in this region.  This also meets
criteria for a 50 -70% moderate left ICA stenosis.

LEFT VERTEBRAL ARTERY:  Antegrade
IMPRESSION: Bilateral moderate carotid atherosclerosis.

50-70% proximal ICA stenoses bilaterally by ultrasound criteria.

## 2011-07-13 MED ORDER — BISOPROLOL-HYDROCHLOROTHIAZIDE 10-6.25 MG PO TABS: 1.0000 | ORAL_TABLET | Freq: Every day | ORAL | Status: DC

## 2011-08-04 ENCOUNTER — Encounter: Payer: Self-pay | Admitting: Internal Medicine

## 2011-08-04 ENCOUNTER — Ambulatory Visit (INDEPENDENT_AMBULATORY_CARE_PROVIDER_SITE_OTHER): Payer: Self-pay | Admitting: *Deleted

## 2011-08-04 DIAGNOSIS — I428 Other cardiomyopathies: Secondary | ICD-10-CM

## 2011-08-04 DIAGNOSIS — I5022 Chronic systolic (congestive) heart failure: Secondary | ICD-10-CM

## 2011-08-04 LAB — ICD DEVICE OBSERVATION
BRDY-0002RV: 40 {beats}/min
DEVICE MODEL ICD: 747960
FVT: 0
HV IMPEDENCE: 49 Ohm
PACEART VT: 0
RV LEAD AMPLITUDE: 12 mv
RV LEAD IMPEDENCE ICD: 512.5 Ohm
RV LEAD THRESHOLD: 0.75 V
VF: 0

## 2011-08-04 NOTE — Progress Notes (Signed)
ICD check 

## 2011-09-06 ENCOUNTER — Ambulatory Visit (INDEPENDENT_AMBULATORY_CARE_PROVIDER_SITE_OTHER): Payer: Self-pay | Admitting: Cardiology

## 2011-09-06 ENCOUNTER — Encounter: Payer: Self-pay | Admitting: Cardiology

## 2011-09-06 DIAGNOSIS — I2510 Atherosclerotic heart disease of native coronary artery without angina pectoris: Secondary | ICD-10-CM

## 2011-09-06 DIAGNOSIS — Z951 Presence of aortocoronary bypass graft: Secondary | ICD-10-CM | POA: Insufficient documentation

## 2011-09-06 DIAGNOSIS — E785 Hyperlipidemia, unspecified: Secondary | ICD-10-CM

## 2011-09-06 DIAGNOSIS — Z9889 Other specified postprocedural states: Secondary | ICD-10-CM

## 2011-09-06 DIAGNOSIS — I428 Other cardiomyopathies: Secondary | ICD-10-CM

## 2011-09-06 NOTE — Assessment & Plan Note (Signed)
The patient has no new sypmtoms.  No further cardiovascular testing is indicated.  We will continue with aggressive risk reduction and meds as listed.  

## 2011-09-06 NOTE — Progress Notes (Signed)
HPI The patient returns for routine followup. Since I last saw him he has done very well. He is walking in his apartment 30 minutes daily. With this level of activity he denies any chest discomfort, neck or arm discomfort. He is not noticing any shortness of breath, PND or orthopnea. He has had no palpitations, presyncope or syncope. He denies any chest pressure, neck or arm discomfort. He has had no edema.  However, he has gained six pounds because he is eating too much.  No Known Allergies  Current Outpatient Prescriptions  Medication Sig Dispense Refill  . aspirin 81 MG tablet Take 81 mg by mouth daily.        Marland Kitchen atorvastatin (LIPITOR) 80 MG tablet Take 80 mg by mouth daily.        . bisoprolol-hydrochlorothiazide (ZIAC) 10-6.25 MG per tablet Take 1 tablet by mouth daily.  30 tablet  3  . enalapril (VASOTEC) 20 MG tablet Take 1 tablet (20 mg total) by mouth daily.  30 tablet  6  . fish oil-omega-3 fatty acids 1000 MG capsule Take 1 g by mouth 3 (three) times daily.       . niacin (NIASPAN) 500 MG CR tablet Take 500 mg by mouth at bedtime.        . nitroGLYCERIN (NITROSTAT) 0.4 MG SL tablet Place 1 tablet (0.4 mg total) under the tongue every 5 (five) minutes as needed.  25 tablet  3  . omeprazole (PRILOSEC) 20 MG capsule Take 20 mg by mouth as needed.         Past Medical History  Diagnosis Date  . Left ventricular dysfunction     apical thrombus  . CHF (congestive heart failure)     EF 20%  . GI bleed     Mallory-Weiss tear EGD 02/2010  . Small bowel obstruction 2007    resolved without surgery  . Ileus 2006    required hospitalization  . Hyperlipidemia   . MRSA bacteremia   . Coronary artery disease   . 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 Jude    Past Surgical History  Procedure Date  . Coronary artery bypass graft     6 vessels, 2000  . Insert / replace / remove pacemaker 01/05/10    ICD insertion/St. Jude single chamber    . Basilic vein left arm resection     ROS  As stated in the HPI and negative for all other systems.   PHYSICAL EXAM BP 134/70  Pulse 54  Resp 16  Ht 5\' 11"  (1.803 m)  Wt 186 lb (84.369 kg)  BMI 25.94 kg/m2 GENERAL:  Well appearing NECK:  No jugular venous distention, waveform within normal limits, carotid upstroke brisk and symmetric, no bruits, no thyromegaly LYMPHATICS:  No cervical, inguinal adenopathy LUNGS:  Clear to auscultation bilaterally BACK:  No CVA tenderness CHEST:  Well healed sternotomy scar, ICD scar HEART:  PMI not displaced or sustained,S1 and S2 within normal limits, no S3, no S4, no clicks, no rubs, no murmurs ABD:  Flat, positive bowel sounds normal in frequency in pitch, no bruits, no rebound, no guarding, no midline pulsatile mass, no hepatomegaly, no splenomegaly EXT:  2 plus pulses throughout, trace edema, no cyanosis no clubbing SKIN:  No rashes no nodules NEURO:  Cranial nerves II through XII grossly intact, motor grossly intact throughout PSYCH:  Cognitively intact, oriented to person place and time  EKG: Sinus rhythm, rate 55, axis within normal  limits, intervals within normal limits, old anterior infarct, unchanged from previous  ASSESSMENT AND PLAN

## 2011-09-06 NOTE — Assessment & Plan Note (Signed)
He seems to be euvolemic.  At this point, no change in therapy is indicated.  We have reviewed salt and fluid restrictions.  No further cardiovascular testing is indicated.   

## 2011-09-06 NOTE — Patient Instructions (Signed)
The current medical regimen is effective;  continue present plan and medications.  Follow up in 6 months with Dr Hochrein.  You will receive a letter in the mail 2 months before you are due.  Please call us when you receive this letter to schedule your follow up appointment.  

## 2011-10-30 ENCOUNTER — Ambulatory Visit (INDEPENDENT_AMBULATORY_CARE_PROVIDER_SITE_OTHER): Payer: Self-pay | Admitting: *Deleted

## 2011-10-30 ENCOUNTER — Encounter: Payer: Self-pay | Admitting: Internal Medicine

## 2011-10-30 DIAGNOSIS — I428 Other cardiomyopathies: Secondary | ICD-10-CM

## 2011-10-30 DIAGNOSIS — Z95810 Presence of automatic (implantable) cardiac defibrillator: Secondary | ICD-10-CM

## 2011-10-30 NOTE — Progress Notes (Signed)
icd check in clinic  

## 2011-11-13 ENCOUNTER — Other Ambulatory Visit: Payer: Self-pay | Admitting: *Deleted

## 2011-11-13 MED ORDER — BISOPROLOL-HYDROCHLOROTHIAZIDE 10-6.25 MG PO TABS: 1.0000 | ORAL_TABLET | Freq: Every day | ORAL | Status: DC

## 2011-11-24 ENCOUNTER — Encounter: Payer: Self-pay | Admitting: *Deleted

## 2012-01-03 ENCOUNTER — Other Ambulatory Visit: Payer: Self-pay | Admitting: *Deleted

## 2012-01-03 MED ORDER — ENALAPRIL MALEATE 20 MG PO TABS: 20.0000 mg | ORAL_TABLET | Freq: Every day | ORAL | Status: DC

## 2012-02-09 ENCOUNTER — Ambulatory Visit (INDEPENDENT_AMBULATORY_CARE_PROVIDER_SITE_OTHER): Payer: Self-pay | Admitting: *Deleted

## 2012-02-09 ENCOUNTER — Encounter: Payer: Self-pay | Admitting: Internal Medicine

## 2012-02-09 DIAGNOSIS — I428 Other cardiomyopathies: Secondary | ICD-10-CM

## 2012-02-09 DIAGNOSIS — I5022 Chronic systolic (congestive) heart failure: Secondary | ICD-10-CM

## 2012-02-09 LAB — ICD DEVICE OBSERVATION
DEVICE MODEL ICD: 747960
RV LEAD AMPLITUDE: 12 mv
RV LEAD IMPEDENCE ICD: 475 Ohm
RV LEAD THRESHOLD: 0.75 V
TOT-0008: 0
TOT-0009: 1
TOT-0010: 11

## 2012-02-09 NOTE — Progress Notes (Signed)
ICD check with [employer] 

## 2012-03-18 ENCOUNTER — Encounter: Payer: Self-pay | Admitting: Cardiology

## 2012-03-20 ENCOUNTER — Ambulatory Visit (INDEPENDENT_AMBULATORY_CARE_PROVIDER_SITE_OTHER): Payer: Self-pay | Admitting: Cardiology

## 2012-03-20 ENCOUNTER — Encounter: Payer: Self-pay | Admitting: Cardiology

## 2012-03-20 VITALS — BP 120/70 | HR 67 | Ht 71.0 in | Wt 182.0 lb

## 2012-03-20 DIAGNOSIS — I2510 Atherosclerotic heart disease of native coronary artery without angina pectoris: Secondary | ICD-10-CM

## 2012-03-20 DIAGNOSIS — Z95810 Presence of automatic (implantable) cardiac defibrillator: Secondary | ICD-10-CM

## 2012-03-20 DIAGNOSIS — I5022 Chronic systolic (congestive) heart failure: Secondary | ICD-10-CM

## 2012-03-20 DIAGNOSIS — E785 Hyperlipidemia, unspecified: Secondary | ICD-10-CM

## 2012-03-20 NOTE — Assessment & Plan Note (Signed)
He seems to be euvolemic.  At this point, no change in therapy is indicated.  We have reviewed salt and fluid restrictions.  No further cardiovascular testing is indicated.   

## 2012-03-20 NOTE — Assessment & Plan Note (Signed)
The patient has no new sypmtoms.  No further cardiovascular testing is indicated.  We will continue with aggressive risk reduction and meds as listed.  

## 2012-03-20 NOTE — Patient Instructions (Signed)
The current medical regimen is effective;  continue present plan and medications.  Follow up in 6 months with Dr Hochrein.  You will receive a letter in the mail 2 months before you are due.  Please call us when you receive this letter to schedule your follow up appointment.  

## 2012-03-20 NOTE — Assessment & Plan Note (Signed)
His last LDL was 91 and HDL 45. He's going to try to improve his diet and have this repeated by his primary provider.

## 2012-03-20 NOTE — Progress Notes (Signed)
   HPI The patient returns for routine followup. Since I last saw him he has done very well. He is retired but with the [employer]. He is active with this and he denies any chest discomfort, neck or arm discomfort. He is not noticing any shortness of breath, PND or orthopnea. He has had no palpitations, presyncope or syncope. He denies any chest pressure, neck or arm discomfort. He has had no edema.    No Known Allergies  Current Outpatient Prescriptions  Medication Sig Dispense Refill  . aspirin 81 MG tablet Take 81 mg by mouth daily.        Marland Kitchen atorvastatin (LIPITOR) 80 MG tablet Take 80 mg by mouth daily.        . bisoprolol-hydrochlorothiazide (ZIAC) 10-6.25 MG per tablet Take 1 tablet by mouth daily.  30 tablet  10  . enalapril (VASOTEC) 20 MG tablet Take 1 tablet (20 mg total) by mouth daily.  30 tablet  12  . fish oil-omega-3 fatty acids 1000 MG capsule Take 1 g by mouth 3 (three) times daily.       . niacin (NIASPAN) 500 MG CR tablet Take 500 mg by mouth at bedtime.        . nitroGLYCERIN (NITROSTAT) 0.4 MG SL tablet Place 1 tablet (0.4 mg total) under the tongue every 5 (five) minutes as needed.  25 tablet  3  . omeprazole (PRILOSEC) 20 MG capsule Take 20 mg by mouth as needed.         Past Medical History  Diagnosis Date  . Left ventricular dysfunction     apical thrombus  . CHF (congestive heart failure)     EF 20%  . GI bleed     Mallory-Weiss tear EGD 02/2010  . Small bowel obstruction 2007    resolved without surgery  . Ileus 2006    required hospitalization  . Hyperlipidemia   . MRSA bacteremia   . Coronary artery disease   . 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 Jude    Past Surgical History  Procedure Date  . Coronary artery bypass graft     6 vessels, 2000  . Insert / replace / remove pacemaker 01/05/10    ICD insertion/St. Jude single chamber  . Basilic vein left arm resection     ROS  As stated  in the HPI and negative for all other systems.   PHYSICAL EXAM BP 120/70  Pulse 67  Ht 5\' 11"  (1.803 m)  Wt 182 lb (82.555 kg)  BMI 25.38 kg/m2 GENERAL:  Well appearing NECK:  No jugular venous distention, waveform within normal limits, carotid upstroke brisk and symmetric, no bruits, no thyromegaly LYMPHATICS:  No cervical, inguinal adenopathy LUNGS:  Clear to auscultation bilaterally BACK:  No CVA tenderness CHEST:  Well healed sternotomy scar, ICD scar HEART:  PMI not displaced or sustained,S1 and S2 within normal limits, no S3, no S4, no clicks, no rubs, no murmurs ABD:  Flat, positive bowel sounds normal in frequency in pitch, no bruits, no rebound, no guarding, no midline pulsatile mass, no hepatomegaly, no splenomegaly EXT:  2 plus pulses throughout, trace edema, no cyanosis no clubbing  EKG: Sinus rhythm, rate 67, axis within normal limits, intervals within normal limits, old anterior infarct, inferior T-wave inversion in lateral ST depression slightly more pronounced than previous.  ASSESSMENT AND PLAN

## 2012-03-20 NOTE — Assessment & Plan Note (Signed)
His device is working normally. No ICD shocks. We'll recheck in several months. 

## 2012-04-29 ENCOUNTER — Encounter: Payer: Self-pay | Admitting: Internal Medicine

## 2012-04-29 ENCOUNTER — Ambulatory Visit (INDEPENDENT_AMBULATORY_CARE_PROVIDER_SITE_OTHER): Payer: Medicare Other | Admitting: Internal Medicine

## 2012-04-29 VITALS — BP 136/78 | HR 67 | Resp 16 | Ht 71.0 in | Wt 188.0 lb

## 2012-04-29 DIAGNOSIS — I5022 Chronic systolic (congestive) heart failure: Secondary | ICD-10-CM

## 2012-04-29 DIAGNOSIS — I2510 Atherosclerotic heart disease of native coronary artery without angina pectoris: Secondary | ICD-10-CM

## 2012-04-29 DIAGNOSIS — Z95810 Presence of automatic (implantable) cardiac defibrillator: Secondary | ICD-10-CM

## 2012-04-29 LAB — ICD DEVICE OBSERVATION
DEVICE MODEL ICD: 747960
PACEART VT: 0
RV LEAD AMPLITUDE: 12 mv
RV LEAD IMPEDENCE ICD: 512.5 Ohm
RV LEAD THRESHOLD: 0.75 V
TOT-0008: 0
TOT-0009: 1

## 2012-04-29 NOTE — Assessment & Plan Note (Signed)
His symptoms are currently class II. He will continue his current medical therapy. He is instructed to maintain a low-sodium diet.

## 2012-04-29 NOTE — Progress Notes (Signed)
HPI Joseph [name] returns today for followup. He is a 56 year old man with an ischemic cardiomyopathy, dyslipidemia, status post bypass surgery, with chronic systolic heart failure, ejection fraction 20%. He is status post ICD implantation. He denies chest pain or shortness of breath. He has persistent peripheral edema particularly in his left leg. He denies syncope, fevers, chills, or claudication. No recent ICD shocks. No Known Allergies   Current Outpatient Prescriptions  Medication Sig Dispense Refill  . aspirin 81 MG tablet Take 81 mg by mouth daily.        Marland Kitchen atorvastatin (LIPITOR) 80 MG tablet Take 80 mg by mouth daily.        . bisoprolol-hydrochlorothiazide (ZIAC) 10-6.25 MG per tablet Take 1 tablet by mouth daily.  30 tablet  10  . enalapril (VASOTEC) 20 MG tablet Take 1 tablet (20 mg total) by mouth daily.  30 tablet  12  . fish oil-omega-3 fatty acids 1000 MG capsule Take 1 g by mouth 3 (three) times daily.       . niacin (NIASPAN) 500 MG CR tablet Take 500 mg by mouth at bedtime.        . nitroGLYCERIN (NITROSTAT) 0.4 MG SL tablet Place 1 tablet (0.4 mg total) under the tongue every 5 (five) minutes as needed.  25 tablet  3  . omeprazole (PRILOSEC) 20 MG capsule Take 20 mg by mouth daily.          Past Medical History  Diagnosis Date  . Left ventricular dysfunction     apical thrombus  . CHF (congestive heart failure)     EF 20%  . GI bleed     Mallory-Weiss tear EGD 02/2010  . Small bowel obstruction 2007    resolved without surgery  . Ileus 2006    required hospitalization  . Hyperlipidemia   . MRSA bacteremia   . Coronary artery disease   . 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 Jude    ROS:   All systems reviewed and negative except as noted in the HPI.   Past Surgical History  Procedure Date  . Coronary artery bypass graft     6 vessels, 2000  . Insert / replace / remove pacemaker 01/05/10    ICD insertion/[employer].  Jude single chamber  . Basilic vein left arm resection      Family History  Problem Relation Age of Onset  . Heart attack Mother   . Emphysema Father   . Coronary artery disease Father     s/p cabg     History   Social History  . Marital Status: Married    Spouse Name: N/A    Number of Children: 3  . 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 Smoking status: Former Smoker    Quit date: 02/29/1988  . Smokeless tobacco: Never Used  . Alcohol Use: No  . Drug Use: No  . Sexually Active:    Other Topics Concern  . Not on file   Social History Narrative   1 son died age 25-Lee's syndrome     BP 136/78  Pulse 67  Resp 16  Ht 5\' 11"  (1.803 m)  Wt 188 lb (85.276 kg)  BMI 26.22 kg/m2  Physical Exam:  Well appearing middle-aged man, NAD HEENT: Unremarkable Neck:  7 cm JVD, no thyromegally Lungs:  Clear with no wheezes,  rales, or rhonchi. HEART:  Regular rate rhythm, no murmurs, no rubs, no clicks Abd:  soft, positive bowel sounds, no organomegally, no rebound, no guarding Ext:  2 plus pulses, no edema, no cyanosis, no clubbing Skin:  No rashes no nodules Neuro:  CN II through XII intact, motor grossly intact  DEVICE  Normal device function.  See PaceArt for details.   Assess/Plan:

## 2012-04-29 NOTE — Patient Instructions (Signed)
**Note De-Identified [name] Obfuscation** Your physician recommends that you continue on your current medications as directed. Please refer to the Current Medication list given to you today.  Your physician recommends that you schedule a follow-up appointment in: device check in 3 months and with Dr. Ladona Ridgel in 1 year

## 2012-04-29 NOTE — Assessment & Plan Note (Signed)
He denies anginal symptoms. He will continue aggressive medical therapy and continued to remain active, increase his physical activity.

## 2012-04-29 NOTE — Assessment & Plan Note (Signed)
His device is working normally. We'll plan to recheck in several months. 

## 2012-08-09 ENCOUNTER — Encounter: Payer: Self-pay | Admitting: *Deleted

## 2012-08-21 ENCOUNTER — Ambulatory Visit (INDEPENDENT_AMBULATORY_CARE_PROVIDER_SITE_OTHER): Payer: Medicare Other | Admitting: *Deleted

## 2012-08-21 DIAGNOSIS — I428 Other cardiomyopathies: Secondary | ICD-10-CM

## 2012-08-21 DIAGNOSIS — I5022 Chronic systolic (congestive) heart failure: Secondary | ICD-10-CM

## 2012-08-21 DIAGNOSIS — Z95810 Presence of automatic (implantable) cardiac defibrillator: Secondary | ICD-10-CM

## 2012-08-21 LAB — ICD DEVICE OBSERVATION
CHARGE TIME: 9.3 s
DEVICE MODEL ICD: 747960
FVT: 0
RV LEAD AMPLITUDE: 12 mv
TOT-0007: 1
TOT-0008: 0
VENTRICULAR PACING ICD: 0 pct

## 2012-08-21 NOTE — Progress Notes (Signed)
defib check in clinic  

## 2012-09-06 ENCOUNTER — Other Ambulatory Visit: Payer: Self-pay | Admitting: *Deleted

## 2012-09-06 MED ORDER — BISOPROLOL-HYDROCHLOROTHIAZIDE 10-6.25 MG PO TABS: 1.0000 | ORAL_TABLET | Freq: Every day | ORAL | Status: DC

## 2012-09-10 ENCOUNTER — Encounter: Payer: Self-pay | Admitting: Internal Medicine

## 2012-09-18 ENCOUNTER — Ambulatory Visit (INDEPENDENT_AMBULATORY_CARE_PROVIDER_SITE_OTHER): Payer: Medicare Other | Admitting: Cardiology

## 2012-09-18 ENCOUNTER — Encounter: Payer: Self-pay | Admitting: Cardiology

## 2012-09-18 VITALS — BP 140/80 | HR 56 | Ht 71.0 in | Wt 183.0 lb

## 2012-09-18 DIAGNOSIS — I428 Other cardiomyopathies: Secondary | ICD-10-CM

## 2012-09-18 DIAGNOSIS — E785 Hyperlipidemia, unspecified: Secondary | ICD-10-CM

## 2012-09-18 DIAGNOSIS — I2510 Atherosclerotic heart disease of native coronary artery without angina pectoris: Secondary | ICD-10-CM

## 2012-09-18 DIAGNOSIS — Z951 Presence of aortocoronary bypass graft: Secondary | ICD-10-CM

## 2012-09-18 DIAGNOSIS — Z95810 Presence of automatic (implantable) cardiac defibrillator: Secondary | ICD-10-CM

## 2012-09-18 NOTE — Patient Instructions (Addendum)
The current medical regimen is effective;  continue present plan and medications.  Follow up in 1 year with Dr Hochrein.  You will receive a letter in the mail 2 months before you are due.  Please call us when you receive this letter to schedule your follow up appointment.  

## 2012-09-18 NOTE — Progress Notes (Signed)
HPI The patient returns for routine followup. Since I last saw him he has done very well. He is retired but does walk daily. He is doing other things as well and he denies any chest discomfort, neck or arm discomfort. He is not noticing any shortness of breath, PND or orthopnea. He has had no palpitations, presyncope or syncope. He denies any chest pressure, neck or arm discomfort. He has had no edema.    No Known Allergies  Current Outpatient Prescriptions  Medication Sig Dispense Refill  . aspirin 81 MG tablet Take 81 mg by mouth daily.        . bisoprolol-hydrochlorothiazide (ZIAC) 10-6.25 MG per tablet Take 1 tablet by mouth daily.  30 tablet  11  . enalapril (VASOTEC) 20 MG tablet Take 1 tablet (20 mg total) by mouth daily.  30 tablet  12  . fish oil-omega-3 fatty acids 1000 MG capsule Take 1 g by mouth 3 (three) times daily.       . niacin (NIASPAN) 500 MG CR tablet Take 500 mg by mouth at bedtime.        . nitroGLYCERIN (NITROSTAT) 0.4 MG SL tablet Place 1 tablet (0.4 mg total) under the tongue every 5 (five) minutes as needed.  25 tablet  3  . omeprazole (PRILOSEC) 20 MG capsule Take 20 mg by mouth daily.       . rosuvastatin (CRESTOR) 20 MG tablet Take 20 mg by mouth daily.        Past Medical History  Diagnosis Date  . Left ventricular dysfunction     apical thrombus  . CHF (congestive heart failure)     EF 20%  . GI bleed     Mallory-Weiss tear EGD 02/2010  . Small bowel obstruction 2007    resolved without surgery  . Ileus 2006    required hospitalization  . Hyperlipidemia   . MRSA bacteremia   . Coronary artery disease   . 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 Jude    Past Surgical History  Procedure Date  . Coronary artery bypass graft     6 vessels, 2000  . Insert / replace / remove pacemaker 01/05/10    ICD insertion/St. Jude single chamber  . Basilic vein left arm resection     ROS  As stated in the HPI  and negative for all other systems.   PHYSICAL EXAM BP 140/80  Pulse 56  Ht 5\' 11"  (1.803 m)  Wt 183 lb (83.008 kg)  BMI 25.52 kg/m2 GENERAL:  Well appearing NECK:  No jugular venous distention, waveform within normal limits, carotid upstroke brisk and symmetric, no bruits, no thyromegaly LYMPHATICS:  No cervical, inguinal adenopathy LUNGS:  Clear to auscultation bilaterally BACK:  No CVA tenderness CHEST:  Well healed sternotomy scar, ICD scar HEART:  PMI not displaced or sustained,S1 and S2 within normal limits, no S3, no S4, no clicks, no rubs, no murmurs ABD:  Flat, positive bowel sounds normal in frequency in pitch, no bruits, no rebound, no guarding, no midline pulsatile mass, no hepatomegaly, no splenomegaly EXT:  2 plus pulses throughout, trace edema, no cyanosis no clubbing  EKG: Sinus rhythm, rate 56, axis within normal limits, intervals within normal limits, old anterior infarct.  Missing lead V2  09/18/2012  ASSESSMENT AND PLAN  Coronary artery disease -  The patient has no new sypmtoms. No further cardiovascular testing is indicated. We will continue with aggressive risk reduction and meds  as listed.  CARDIOMYOPATHY -  Mr. Brueckner seems to be euvolemic. At this point, no change in therapy is indicated. We have reviewed salt and fluid restrictions. No further cardiovascular testing is indicated.   HYPERLIPIDEMIA -  He has this followed by Josue Hector, MD.  The LDL earlier this month was 36 with an HDL or it. This is at goal. No change in therapy is indicated.  ICD - He is up-to-date with followup.

## 2012-11-04 ENCOUNTER — Ambulatory Visit (INDEPENDENT_AMBULATORY_CARE_PROVIDER_SITE_OTHER): Payer: Medicare Other | Admitting: *Deleted

## 2012-11-04 DIAGNOSIS — I428 Other cardiomyopathies: Secondary | ICD-10-CM

## 2012-11-04 DIAGNOSIS — I5022 Chronic systolic (congestive) heart failure: Secondary | ICD-10-CM

## 2012-11-04 LAB — ICD DEVICE OBSERVATION
BRDY-0002RV: 40 {beats}/min
FVT: 0
HV IMPEDENCE: 49 Ohm
PACEART VT: 0
RV LEAD IMPEDENCE ICD: 512.5 Ohm
RV LEAD THRESHOLD: 0.75 V
TOT-0009: 1
VENTRICULAR PACING ICD: 0 pct
VF: 0

## 2012-11-04 NOTE — Progress Notes (Signed)
ICD check with ICM 

## 2012-12-03 ENCOUNTER — Other Ambulatory Visit: Payer: Self-pay | Admitting: *Deleted

## 2012-12-03 MED ORDER — ENALAPRIL MALEATE 20 MG PO TABS: 20.0000 mg | ORAL_TABLET | Freq: Every day | ORAL | Status: DC

## 2012-12-31 ENCOUNTER — Encounter: Payer: Self-pay | Admitting: Internal Medicine

## 2013-02-11 ENCOUNTER — Ambulatory Visit (INDEPENDENT_AMBULATORY_CARE_PROVIDER_SITE_OTHER): Payer: Medicare Other | Admitting: *Deleted

## 2013-02-11 ENCOUNTER — Encounter: Payer: Self-pay | Admitting: Internal Medicine

## 2013-02-11 ENCOUNTER — Other Ambulatory Visit: Payer: Self-pay | Admitting: Internal Medicine

## 2013-02-11 DIAGNOSIS — I428 Other cardiomyopathies: Secondary | ICD-10-CM

## 2013-02-11 DIAGNOSIS — I5022 Chronic systolic (congestive) heart failure: Secondary | ICD-10-CM

## 2013-02-11 LAB — ICD DEVICE OBSERVATION
BRDY-0002RV: 40 {beats}/min
DEV-0020ICD: NEGATIVE
FVT: 0
HV IMPEDENCE: 45 Ohm
TOT-0010: 14
VENTRICULAR PACING ICD: 0 pct
VF: 0

## 2013-02-11 NOTE — Progress Notes (Signed)
ICD check with [employer] 

## 2013-05-21 ENCOUNTER — Encounter: Payer: Medicare Other | Admitting: Internal Medicine

## 2013-05-28 ENCOUNTER — Encounter: Payer: Self-pay | Admitting: Internal Medicine

## 2013-05-28 ENCOUNTER — Ambulatory Visit (INDEPENDENT_AMBULATORY_CARE_PROVIDER_SITE_OTHER): Payer: Medicare Other | Admitting: Internal Medicine

## 2013-05-28 VITALS — BP 122/80 | HR 62 | Ht 71.0 in | Wt 186.1 lb

## 2013-05-28 DIAGNOSIS — Z95810 Presence of automatic (implantable) cardiac defibrillator: Secondary | ICD-10-CM

## 2013-05-28 DIAGNOSIS — I2510 Atherosclerotic heart disease of native coronary artery without angina pectoris: Secondary | ICD-10-CM

## 2013-05-28 DIAGNOSIS — I5022 Chronic systolic (congestive) heart failure: Secondary | ICD-10-CM

## 2013-05-28 LAB — ICD DEVICE OBSERVATION
BRDY-0002RV: 40 {beats}/min
DEV-0020ICD: NEGATIVE
FVT: 0
HV IMPEDENCE: 49 Ohm
RV LEAD AMPLITUDE: 12 mv
TOT-0010: 15
VF: 0

## 2013-05-28 NOTE — Progress Notes (Signed)
HPI Joseph [name] returns today for followup. He is a very pleasant 57 year old man with an ischemic cardiomyopathy, chronic systolic heart failure, ejection fraction 25%, status post ICD implantation. In the interim, he has done well. He denies chest pain, shortness of breath, fevers, chills, or syncope. No peripheral edema. No Known Allergies   Current Outpatient Prescriptions  Medication Sig Dispense Refill  . aspirin 81 MG tablet Take 81 mg by mouth daily.        . bisoprolol-hydrochlorothiazide (ZIAC) 10-6.25 MG per tablet Take 1 tablet by mouth daily.  30 tablet  11  . enalapril (VASOTEC) 20 MG tablet Take 1 tablet (20 mg total) by mouth daily.  30 tablet  12  . fish oil-omega-3 fatty acids 1000 MG capsule Take 1 g by mouth 3 (three) times daily.       . niacin (NIASPAN) 500 MG CR tablet Take 500 mg by mouth at bedtime.        . nitroGLYCERIN (NITROSTAT) 0.4 MG SL tablet Place 1 tablet (0.4 mg total) under the tongue every 5 (five) minutes as needed.  25 tablet  3  . omeprazole (PRILOSEC) 20 MG capsule Take 20 mg by mouth daily.       . rosuvastatin (CRESTOR) 20 MG tablet Take 40 mg by mouth daily.        No current facility-administered medications for this visit.     Past Medical History  Diagnosis Date  . Left ventricular dysfunction     apical thrombus  . CHF (congestive heart failure)     EF 20%  . GI bleed     Mallory-Weiss tear EGD 02/2010  . Small bowel obstruction 2007    resolved without surgery  . Ileus 2006    required hospitalization  . Hyperlipidemia   . MRSA bacteremia   . Coronary artery disease   . 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 Jude    ROS:   All systems reviewed and negative except as noted in the HPI.   Past Surgical History  Procedure Laterality Date  . Coronary artery bypass graft      6 vessels, 2000  . Insert / replace / remove pacemaker  01/05/10    ICD insertion/St. Jude single chamber   . Basilic vein left arm resection       Family History  Problem Relation Age of Onset  . Heart attack Mother   . Emphysema Father   . Coronary artery disease Father     s/p cabg     History   Social History  . Marital Status: Married    Spouse Name: N/A    Number of Children: 3  . 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 Smoking status: Former Smoker    Quit date: 02/29/1988  . Smokeless tobacco: Never Used  . Alcohol Use: No  . Drug Use: No  . Sexually Active:    Other Topics Concern  . Not on file   Social History Narrative   1 son died age 45-Lee's syndrome           BP 122/80  Pulse 62  Ht 5\' 11"  (1.803 m)  Wt 186 lb 1.3 oz (84.405 kg)  BMI 25.96 kg/m2  SpO2 97%  Physical Exam:  Well appearing middle-aged man,NAD HEENT: Unremarkable Neck:  7 cm JVD, no thyromegally Back:  No CVA tenderness Lungs:  Clear with no wheezes, rales, or rhonchi. HEART:  Regular rate rhythm, no murmurs, no rubs, no clicks Abd:  soft, positive bowel sounds, no organomegally, no rebound, no guarding Ext:  2 plus pulses, no edema, no cyanosis, no clubbing Skin:  No rashes no nodules Neuro:  CN II through XII intact, motor grossly intact  DEVICE  Normal device function.  See PaceArt for details.   Assess/Plan:

## 2013-05-28 NOTE — Assessment & Plan Note (Signed)
His heart failure symptoms remain class II. He will continue his current medical therapy, maintain a low-sodium diet, and I've encouraged the patient to increase his physical activity.

## 2013-05-28 NOTE — Patient Instructions (Addendum)
Your physician recommends that you schedule a follow-up appointment with Dr Ladona Ridgel in 1 YEAR. You will receive a reminder letter in the mail in about 10 months reminding you to call and schedule your appointment. If you don't receive this letter, please contact our office.

## 2013-05-28 NOTE — Assessment & Plan Note (Signed)
His St. Jude ICD is working normally. We'll plan to recheck in several months. 

## 2013-05-28 NOTE — Assessment & Plan Note (Signed)
He denies anginal symptoms. He'll continue his current medical therapy. 

## 2013-05-30 ENCOUNTER — Emergency Department (HOSPITAL_COMMUNITY): Payer: Medicare Other

## 2013-05-30 ENCOUNTER — Emergency Department (HOSPITAL_COMMUNITY)
Admission: EM | Admit: 2013-05-30 | Discharge: 2013-05-30 | Disposition: A | Discharge status: Home / Self Care | Payer: Medicare Other | Attending: Emergency Medicine | Admitting: Emergency Medicine

## 2013-05-30 ENCOUNTER — Encounter (HOSPITAL_COMMUNITY): Payer: Self-pay

## 2013-05-30 ENCOUNTER — Other Ambulatory Visit: Payer: Self-pay

## 2013-05-30 DIAGNOSIS — Z95810 Presence of automatic (implantable) cardiac defibrillator: Secondary | ICD-10-CM | POA: Insufficient documentation

## 2013-05-30 DIAGNOSIS — R531 Weakness: Secondary | ICD-10-CM

## 2013-05-30 DIAGNOSIS — E785 Hyperlipidemia, unspecified: Secondary | ICD-10-CM | POA: Insufficient documentation

## 2013-05-30 DIAGNOSIS — Z87891 Personal history of nicotine dependence: Secondary | ICD-10-CM | POA: Insufficient documentation

## 2013-05-30 DIAGNOSIS — Z7982 Long term (current) use of aspirin: Secondary | ICD-10-CM | POA: Insufficient documentation

## 2013-05-30 DIAGNOSIS — R42 Dizziness and giddiness: Secondary | ICD-10-CM | POA: Insufficient documentation

## 2013-05-30 DIAGNOSIS — Z8719 Personal history of other diseases of the digestive system: Secondary | ICD-10-CM | POA: Insufficient documentation

## 2013-05-30 DIAGNOSIS — R110 Nausea: Secondary | ICD-10-CM | POA: Insufficient documentation

## 2013-05-30 DIAGNOSIS — I2510 Atherosclerotic heart disease of native coronary artery without angina pectoris: Secondary | ICD-10-CM | POA: Insufficient documentation

## 2013-05-30 DIAGNOSIS — Z8679 Personal history of other diseases of the circulatory system: Secondary | ICD-10-CM | POA: Insufficient documentation

## 2013-05-30 DIAGNOSIS — Z8614 Personal history of Methicillin resistant Staphylococcus aureus infection: Secondary | ICD-10-CM | POA: Insufficient documentation

## 2013-05-30 DIAGNOSIS — I509 Heart failure, unspecified: Secondary | ICD-10-CM | POA: Insufficient documentation

## 2013-05-30 DIAGNOSIS — I498 Other specified cardiac arrhythmias: Secondary | ICD-10-CM | POA: Insufficient documentation

## 2013-05-30 DIAGNOSIS — R5381 Other malaise: Secondary | ICD-10-CM | POA: Insufficient documentation

## 2013-05-30 DIAGNOSIS — R5383 Other fatigue: Secondary | ICD-10-CM | POA: Insufficient documentation

## 2013-05-30 DIAGNOSIS — Z79899 Other long term (current) drug therapy: Secondary | ICD-10-CM | POA: Insufficient documentation

## 2013-05-30 DIAGNOSIS — Z951 Presence of aortocoronary bypass graft: Secondary | ICD-10-CM | POA: Insufficient documentation

## 2013-05-30 LAB — COMPREHENSIVE METABOLIC PANEL
ALT: 16 U/L (ref 0–53)
AST: 20 U/L (ref 0–37)
Albumin: 3.8 g/dL (ref 3.5–5.2)
CO2: 27 mEq/L (ref 19–32)
Chloride: 102 mEq/L (ref 96–112)
GFR calc non Af Amer: 71 mL/min — ABNORMAL LOW (ref >90)
Potassium: 4.3 mEq/L (ref 3.5–5.1)
Sodium: 137 mEq/L (ref 135–145)
Total Bilirubin: 0.5 mg/dL (ref 0.3–1.2)

## 2013-05-30 LAB — CBC WITH DIFFERENTIAL/PLATELET
Basophils Absolute: 0 10*3/uL (ref 0.0–0.1)
HCT: 44.1 % (ref 39.0–52.0)
Lymphocytes Relative: 12 % (ref 12–46)
Neutro Abs: 12.1 10*3/uL — ABNORMAL HIGH (ref 1.7–7.7)
Neutrophils Relative %: 83 % — ABNORMAL HIGH (ref 43–77)
Platelets: 204 10*3/uL (ref 150–400)
RDW: 13.5 % (ref 11.5–15.5)
WBC: 14.5 10*3/uL — ABNORMAL HIGH (ref 4.0–10.5)

## 2013-05-30 MED ORDER — SODIUM CHLORIDE 0.9 % IV BOLUS (SEPSIS)
500.0000 mL | Freq: Once | INTRAVENOUS | Status: AC
  Administered 2013-05-30: 500 mL via INTRAVENOUS

## 2013-05-30 NOTE — ED Notes (Signed)
Pt reports that he has been feeling weak and dizzy since yesterday. A family member checked his pulse and was in the 50's at home today. Feels like his balance is off.

## 2013-05-30 NOTE — ED Notes (Signed)
MD at bedside. 

## 2013-05-30 NOTE — ED Notes (Signed)
Patient returned from CT

## 2013-05-30 NOTE — ED Provider Notes (Signed)
History    This chart was scribed for Benny Lennert, MD by Toya Smothers, ED Scribe. The patient was seen in room APA18/APA18. Patient's care was started at 1608.  CSN: [id number] Arrival date & time 05/30/13  1608  First MD Initiated Contact with Patient 05/30/13 1615     Chief Complaint  Patient presents with  . Weakness  . Dizziness  . Bradycardia   Patient is a 57 y.o. male presenting with weakness. The history is provided by the patient. No language interpreter was used.  Weakness This is a new problem. The current episode started 12 to 24 hours ago. The problem occurs hourly. The problem has not changed since onset.Pertinent negatives include no chest pain, no abdominal pain and no headaches. The symptoms are aggravated by standing. Nothing relieves the symptoms. He has tried nothing for the symptoms. The treatment provided no relief.    HPI Comments: Joseph [name] is a 57 y.o. male with h/o CHF, AICD placement, and CAD, who presents to the Emergency Department complaining of 24 hours of new, gradual onset, intermittent episodes of dizziness and nausea after standing. Pt denies the sensation of vertigo. Prior to onset. Pt reports typical health. Symptoms have not been treated PTA. Pt denies headache, diaphoresis, fever, chills, vomiting, diarrhea, weakness, cough, SOB and any other pain. Pt is a former smoker, denying alcohol and illicit drug use.   Past Medical History  Diagnosis Date  . Left ventricular dysfunction     apical thrombus  . CHF (congestive heart failure)     EF 20%  . GI bleed     Mallory-Weiss tear EGD 02/2010  . Small bowel obstruction 2007    resolved without surgery  . Ileus 2006    required hospitalization  . Hyperlipidemia   . MRSA bacteremia   . Coronary artery disease   . 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 Jude   Past Surgical History  Procedure Laterality Date  . Coronary artery bypass graft       6 vessels, 2000  . Insert / replace / remove pacemaker  01/05/10    ICD insertion/St. Jude single chamber  . Basilic vein left arm resection     Family History  Problem Relation Age of Onset  . Heart attack Mother   . Emphysema Father   . Coronary artery disease Father     s/p cabg   History  Substance Use Topics  . Smoking status: Former Smoker    Quit date: 02/29/1988  . Smokeless tobacco: Never Used  . Alcohol Use: No    Review of Systems  Constitutional: Negative for appetite change and fatigue.  HENT: Negative.  Negative for congestion, sinus pressure and ear discharge.   Eyes: Negative for discharge.  Respiratory: Negative.  Negative for cough.   Cardiovascular: Negative.  Negative for chest pain.  Gastrointestinal: Positive for nausea. Negative for abdominal pain and diarrhea.  Genitourinary: Negative for frequency and hematuria.  Musculoskeletal: Negative.  Negative for back pain.  Skin: Negative.  Negative for rash.  Neurological: Positive for dizziness and weakness. Negative for seizures and headaches.  Psychiatric/Behavioral: Negative.  Negative for hallucinations.    Allergies  Review of patient's allergies indicates no known allergies.  Home Medications   Current Outpatient Rx  Name  Route  Sig  Dispense  Refill  . aspirin 81 MG tablet   Oral   Take 81 mg by mouth daily.           Marland Kitchen  bisoprolol-hydrochlorothiazide (ZIAC) 10-6.25 MG per tablet   Oral   Take 1 tablet by mouth daily.   30 tablet   11   . enalapril (VASOTEC) 20 MG tablet   Oral   Take 1 tablet (20 mg total) by mouth daily.   30 tablet   12   . fish oil-omega-3 fatty acids 1000 MG capsule   Oral   Take 1 g by mouth 3 (three) times daily.          . niacin (NIASPAN) 500 MG CR tablet   Oral   Take 500 mg by mouth at bedtime.           . nitroGLYCERIN (NITROSTAT) 0.4 MG SL tablet   Sublingual   Place 1 tablet (0.4 mg total) under the tongue every 5 (five) minutes as  needed.   25 tablet   3   . omeprazole (PRILOSEC) 20 MG capsule   Oral   Take 20 mg by mouth daily.          . rosuvastatin (CRESTOR) 20 MG tablet   Oral   Take 40 mg by mouth daily.           BP 147/79  Pulse 57  Temp(Src) 97.7 F (36.5 C) (Oral)  Resp 18  Ht 5\' 11"  (1.803 m)  Wt 186 lb (84.369 kg)  BMI 25.95 kg/m2  SpO2 97% Physical Exam  Constitutional: He is oriented to person, place, and time. He appears well-developed.  HENT:  Head: Normocephalic.  Eyes: Conjunctivae and EOM are normal. No scleral icterus.  Neck: Neck supple. No thyromegaly present.  Cardiovascular: Normal rate and regular rhythm.  Exam reveals no gallop and no friction rub.   No murmur heard. Pulmonary/Chest: No stridor. He has no wheezes. He has no rales. He exhibits no tenderness.  Left chest; Implantable cardioverter defibrillator   Abdominal: He exhibits no distension. There is no tenderness. There is no rebound.  Musculoskeletal: Normal range of motion. He exhibits no edema.  Lymphadenopathy:    He has no cervical adenopathy.  Neurological: He is oriented to person, place, and time. Coordination normal.  Skin: No rash noted. No erythema.  Psychiatric: He has a normal mood and affect. His behavior is normal.    ED Course  Procedures DIAGNOSTIC STUDIES: Oxygen Saturation is 97% on room air, normal by my interpretation.    COORDINATION OF CARE: 16:21- Ordered ED EKG Once  16:27 Evaluated Pt. Pt is awake, alert, and without distress. 16:30- Patient and Family understand and agree with initial ED impression and plan with expectations set for ED visit.     Date: 05/30/2013  Rate: 55  Rhythm: normal sinus rhythm  QRS Axis: normal  Intervals: normal  ST/T Wave abnormalities: normal  Conduction Disutrbances:  Narrative Interpretation:   Old EKG Reviewed: none available    Labs Reviewed - No data to display No results found. No diagnosis found.  MDM     The chart was  scribed for me under my direct supervision.  I personally performed the history, physical, and medical decision making and all procedures in the evaluation of this patient.Benny Lennert, MD 05/30/13 [phone]

## 2013-05-30 NOTE — ED Notes (Signed)
Patient with no complaints at this time. Respirations even and unlabored. Skin warm/dry. Discharge instructions reviewed with patient at this time. Patient given opportunity to voice concerns/ask questions. IV removed per policy and band-aid applied to site. Patient discharged at this time and left Emergency Department with steady gait.  

## 2013-07-09 ENCOUNTER — Telehealth: Payer: Self-pay | Admitting: *Deleted

## 2013-07-09 MED ORDER — ENALAPRIL MALEATE 20 MG PO TABS: 10.0000 mg | ORAL_TABLET | Freq: Every day | ORAL | Status: DC

## 2013-07-09 NOTE — Telephone Encounter (Signed)
s/w daughter of pt and she is aware of decrease enalapril to 10 mg daily, per Dr. Antoine Poche needs to see pt, said to squeeze pt in somewhere. pt now scheduled for 07/18/13 @ 12:15

## 2013-07-09 NOTE — Addendum Note (Signed)
Addended by: Tarri Fuller on: 07/09/2013 11:27 AM   Modules accepted: Orders

## 2013-07-18 ENCOUNTER — Encounter: Payer: Self-pay | Admitting: Cardiology

## 2013-07-18 ENCOUNTER — Ambulatory Visit (INDEPENDENT_AMBULATORY_CARE_PROVIDER_SITE_OTHER): Payer: Medicare Other | Admitting: Cardiology

## 2013-07-18 VITALS — BP 128/80 | HR 59 | Ht 71.0 in | Wt 175.2 lb

## 2013-07-18 DIAGNOSIS — I5022 Chronic systolic (congestive) heart failure: Secondary | ICD-10-CM

## 2013-07-18 DIAGNOSIS — I2510 Atherosclerotic heart disease of native coronary artery without angina pectoris: Secondary | ICD-10-CM

## 2013-07-18 NOTE — Progress Notes (Signed)
HPI The patient returns for followup. He was in the emergency room on July 4 for weakness. There were no acute findings and I have reviewed his records.  He did see Dr. Lysbeth Galas and was felt to have had some form of a GI virus. He did for a short while had his ACE inhibitor held. It was restarted. He called [place] because his blood pressure is running low and we have reduced his enalapril to 10 mg daily.  Since that time he has done much better. I reviewed his blood pressure diary and pressures are typically in the 120 systolic range. He denies any chest discomfort, neck or arm discomfort. He is not noticing any shortness of breath, PND or orthopnea. He has had no palpitations, presyncope or syncope. He denies any chest pressure, neck or arm discomfort. He has had no edema.    No Known Allergies  Current Outpatient Prescriptions  Medication Sig Dispense Refill  . acetaminophen (TYLENOL) 500 MG tablet Take 500 mg by mouth every 6 (six) hours as needed for pain.      Marland Kitchen aspirin 81 MG tablet Take 81 mg by mouth at bedtime.       . bisoprolol-hydrochlorothiazide (ZIAC) 10-6.25 MG per tablet Take 1 tablet by mouth daily.  30 tablet  11  . enalapril (VASOTEC) 20 MG tablet Take 0.5 tablets (10 mg total) by mouth daily.      . fish oil-omega-3 fatty acids 1000 MG capsule Take 1 g by mouth 3 (three) times daily.       Marland Kitchen ibuprofen (ADVIL,MOTRIN) 200 MG tablet Take 200 mg by mouth every 6 (six) hours as needed for pain.      . niacin (NIASPAN) 500 MG CR tablet Take 500 mg by mouth at bedtime.        . nitroGLYCERIN (NITROSTAT) 0.4 MG SL tablet Place 1 tablet (0.4 mg total) under the tongue every 5 (five) minutes as needed.  25 tablet  3  . omeprazole (PRILOSEC) 20 MG capsule Take 20 mg by mouth daily.       . rosuvastatin (CRESTOR) 20 MG tablet Take 40 mg by mouth at bedtime.        No current facility-administered medications for this visit.    Past Medical History  Diagnosis Date  . Left ventricular  dysfunction     apical thrombus  . CHF (congestive heart failure)     EF 20%  . GI bleed     Mallory-Weiss tear EGD 02/2010  . Small bowel obstruction 2007    resolved without surgery  . Ileus 2006    required hospitalization  . Hyperlipidemia   . MRSA bacteremia   . Coronary artery disease   . 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 Jude    Past Surgical History  Procedure Laterality Date  . Coronary artery bypass graft      6 vessels, 2000  . Insert / replace / remove pacemaker  01/05/10    ICD insertion/St. Jude single chamber  . Basilic vein left arm resection      ROS  As stated in the HPI and negative for all other systems.   PHYSICAL EXAM BP 128/80  Pulse 59  Ht 5\' 11"  (1.803 m)  Wt 175 lb 3.2 oz (79.47 kg)  BMI 24.45 kg/m2 GENERAL:  Well appearing NECK:  No jugular venous distention, waveform within normal limits, carotid upstroke brisk and symmetric, no bruits, no thyromegaly LYMPHATICS:  No cervical, inguinal adenopathy LUNGS:  Clear to auscultation bilaterally BACK:  No CVA tenderness CHEST:  Well healed sternotomy scar, ICD scar HEART:  PMI not displaced or sustained,S1 and S2 within normal limits, no S3, no S4, no clicks, no rubs, no murmurs ABD:  Flat, positive bowel sounds normal in frequency in pitch, no bruits, no rebound, no guarding, no midline pulsatile mass, no hepatomegaly, no splenomegaly EXT:  2 plus pulses throughout, trace edema, no cyanosis no clubbing  ASSESSMENT AND PLAN  HYPOTENSION - This seems to have improved. For now I will not titrate his medications back for a likely will when I see him back in followup.  Coronary artery disease -  The patient has no new sypmtoms. No further cardiovascular testing is indicated. We will continue with aggressive risk reduction and meds as listed.  CARDIOMYOPATHY -  [name] seems to be euvolemic. At this point, no change in therapy is indicated. We  have reviewed salt and fluid restrictions. No further cardiovascular testing is indicated.   ICD - He is up-to-date with followup.

## 2013-07-18 NOTE — Patient Instructions (Addendum)
The current medical regimen is effective;  continue present plan and medications.  Follow up in October 2014.

## 2013-08-06 ENCOUNTER — Ambulatory Visit (INDEPENDENT_AMBULATORY_CARE_PROVIDER_SITE_OTHER): Payer: Medicare Other | Admitting: *Deleted

## 2013-08-06 DIAGNOSIS — I428 Other cardiomyopathies: Secondary | ICD-10-CM

## 2013-08-06 DIAGNOSIS — I5022 Chronic systolic (congestive) heart failure: Secondary | ICD-10-CM

## 2013-08-06 LAB — ICD DEVICE OBSERVATION
BRDY-0002RV: 40 {beats}/min
DEV-0020ICD: NEGATIVE
FVT: 0
RV LEAD AMPLITUDE: 12 mv
RV LEAD IMPEDENCE ICD: 450 Ohm
RV LEAD THRESHOLD: 0.75 V
TOT-0008: 0
TOT-0010: 15
VF: 0

## 2013-08-06 NOTE — Progress Notes (Signed)
ICD check with [employer] in office. 

## 2013-08-27 ENCOUNTER — Encounter: Payer: Self-pay | Admitting: Internal Medicine

## 2013-09-17 ENCOUNTER — Encounter: Payer: Self-pay | Admitting: Cardiology

## 2013-09-17 ENCOUNTER — Ambulatory Visit (INDEPENDENT_AMBULATORY_CARE_PROVIDER_SITE_OTHER): Payer: Medicare Other | Admitting: Cardiology

## 2013-09-17 VITALS — BP 138/85 | HR 64 | Ht 71.0 in | Wt 180.0 lb

## 2013-09-17 DIAGNOSIS — I428 Other cardiomyopathies: Secondary | ICD-10-CM

## 2013-09-17 DIAGNOSIS — I2510 Atherosclerotic heart disease of native coronary artery without angina pectoris: Secondary | ICD-10-CM

## 2013-09-17 NOTE — Progress Notes (Signed)
HPI The patient returns for followup. He was in the emergency room earlierl this year for weakness.He did for a short while had his ACE inhibitor held. It was restarted. At the last appointment following this he was doing relatively well. I checked his ACE inhibitor at a lower dose. Since that time he has done well. He denies any chest discomfort, neck or arm discomfort. He is not noticing any shortness of breath, PND or orthopnea. He has had no palpitations, presyncope or syncope. He denies any chest pressure, neck or arm discomfort. He has had no edema.    No Known Allergies  Current Outpatient Prescriptions  Medication Sig Dispense Refill  . acetaminophen (TYLENOL) 500 MG tablet Take 500 mg by mouth every 6 (six) hours as needed for pain.      Marland Kitchen aspirin 81 MG tablet Take 81 mg by mouth at bedtime.       . bisoprolol-hydrochlorothiazide (ZIAC) 10-6.25 MG per tablet Take 1 tablet by mouth daily.  30 tablet  11  . enalapril (VASOTEC) 20 MG tablet Take 0.5 tablets (10 mg total) by mouth daily.      . fish oil-omega-3 fatty acids 1000 MG capsule Take 1 g by mouth 3 (three) times daily.       Marland Kitchen ibuprofen (ADVIL,MOTRIN) 200 MG tablet Take 200 mg by mouth every 6 (six) hours as needed for pain.      . niacin (NIASPAN) 500 MG CR tablet Take 500 mg by mouth at bedtime.        . nitroGLYCERIN (NITROSTAT) 0.4 MG SL tablet Place 1 tablet (0.4 mg total) under the tongue every 5 (five) minutes as needed.  25 tablet  3  . omeprazole (PRILOSEC) 20 MG capsule Take 20 mg by mouth daily.       . rosuvastatin (CRESTOR) 20 MG tablet Take 40 mg by mouth at bedtime.        No current facility-administered medications for this visit.    Past Medical History  Diagnosis Date  . Left ventricular dysfunction     apical thrombus  . CHF (congestive heart failure)     EF 20%  . GI bleed     Mallory-Weiss tear EGD 02/2010  . Small bowel obstruction 2007    resolved without surgery  . Ileus 2006    required  hospitalization  . Hyperlipidemia   . MRSA bacteremia   . Coronary artery disease   . 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 Jude    Past Surgical History  Procedure Laterality Date  . Coronary artery bypass graft      6 vessels, 2000  . Insert / replace / remove pacemaker  01/05/10    ICD insertion/St. Jude single chamber  . Basilic vein left arm resection      ROS  As stated in the HPI and negative for all other systems.   PHYSICAL EXAM BP 138/85  Pulse 64  Ht 5\' 11"  (1.803 m)  Wt 180 lb (81.647 kg)  BMI 25.12 kg/m2 GENERAL:  Well appearing NECK:  No jugular venous distention, waveform within normal limits, carotid upstroke brisk and symmetric, no bruits, no thyromegaly LYMPHATICS:  No cervical, inguinal adenopathy LUNGS:  Clear to auscultation bilaterally BACK:  No CVA tenderness CHEST:  Well healed sternotomy scar, ICD scar HEART:  PMI not displaced or sustained,S1 and S2 within normal limits, no S3, no S4, no clicks, no rubs, no murmurs ABD:  Flat, positive  bowel sounds normal in frequency in pitch, no bruits, no rebound, no guarding, no midline pulsatile mass, no hepatomegaly, no splenomegaly EXT:  2 plus pulses throughout, trace edema, no cyanosis no clubbinga SKIN:  Chest rash  ASSESSMENT AND PLAN  HYPOTENSION - This seems to have improved. He will remain on the meds as listed as the BP still is running lower than previous.   Coronary artery disease -  The patient has no new sypmtoms. No further cardiovascular testing is indicated. We will continue with aggressive risk reduction and meds as listed.  CARDIOMYOPATHY -  Mr. Schnelle seems to be euvolemic. At this point, no change in therapy is indicated. We have reviewed salt and fluid restrictions. No further cardiovascular testing is indicated.   ICD - He is up-to-date with followup.

## 2013-09-17 NOTE — Patient Instructions (Signed)
The current medical regimen is effective;  continue present plan and medications.  Follow up in 6 months with Dr Hochrein.  You will receive a letter in the mail 2 months before you are due.  Please call us when you receive this letter to schedule your follow up appointment.  

## 2013-09-19 ENCOUNTER — Encounter: Payer: Self-pay | Admitting: Cardiology

## 2013-09-22 ENCOUNTER — Telehealth: Payer: Self-pay | Admitting: Cardiology

## 2013-09-22 NOTE — Telephone Encounter (Signed)
Received fax refill request  Rx # [id number] Medication:  Ziac 10/6.25mg   Qty 30 Sig:  Take one tablet by mouth every day Physician:  Antoine Poche

## 2013-10-13 ENCOUNTER — Telehealth: Payer: Self-pay | Admitting: Cardiology

## 2013-10-13 MED ORDER — BISOPROLOL-HYDROCHLOROTHIAZIDE 10-6.25 MG PO TABS: 1.0000 | ORAL_TABLET | Freq: Every day | ORAL | Status: DC

## 2013-10-13 NOTE — Telephone Encounter (Signed)
Needs refill on Bisoprolol sent to [facility] in [place] / [place]gs

## 2013-11-07 ENCOUNTER — Ambulatory Visit (INDEPENDENT_AMBULATORY_CARE_PROVIDER_SITE_OTHER): Payer: Medicare Other | Admitting: *Deleted

## 2013-11-07 DIAGNOSIS — I5022 Chronic systolic (congestive) heart failure: Secondary | ICD-10-CM

## 2013-11-07 DIAGNOSIS — I2589 Other forms of chronic ischemic heart disease: Secondary | ICD-10-CM

## 2013-11-07 DIAGNOSIS — I428 Other cardiomyopathies: Secondary | ICD-10-CM

## 2013-11-07 NOTE — Progress Notes (Signed)
ICD check with [employer] in office.

## 2013-11-10 LAB — MDC_IDC_ENUM_SESS_TYPE_INCLINIC
Brady Statistic RV Percent Paced: 0 %
Date Time Interrogation Session: 20141212153141
HighPow Impedance: 51.1705
Implantable Pulse Generator Serial Number: 747960
Lead Channel Impedance Value: 487.5 Ohm
Lead Channel Pacing Threshold Amplitude: 0.75 V
Lead Channel Pacing Threshold Pulse Width: 0.4 ms
Lead Channel Setting Sensing Sensitivity: 0.5 mV

## 2013-11-25 ENCOUNTER — Encounter: Payer: Self-pay | Admitting: Internal Medicine

## 2014-02-06 ENCOUNTER — Ambulatory Visit (INDEPENDENT_AMBULATORY_CARE_PROVIDER_SITE_OTHER): Payer: Medicare Other | Admitting: *Deleted

## 2014-02-06 DIAGNOSIS — I428 Other cardiomyopathies: Secondary | ICD-10-CM

## 2014-02-06 DIAGNOSIS — I5022 Chronic systolic (congestive) heart failure: Secondary | ICD-10-CM

## 2014-02-06 LAB — MDC_IDC_ENUM_SESS_TYPE_INCLINIC
Battery Remaining Longevity: 78 mo
Brady Statistic RV Percent Paced: 1 %
HighPow Impedance: 47 Ohm
Implantable Pulse Generator Serial Number: 747960
Lead Channel Pacing Threshold Amplitude: 0.75 V
Lead Channel Pacing Threshold Pulse Width: 0.4 ms
Lead Channel Sensing Intrinsic Amplitude: 12 mV
Lead Channel Setting Pacing Amplitude: 2.5 V
Lead Channel Setting Pacing Pulse Width: 0.4 ms
Lead Channel Setting Sensing Sensitivity: 0.5 mV
MDC IDC MSMT LEADCHNL RA IMPEDANCE VALUE: 450 Ohm
MDC IDC SET ZONE DETECTION INTERVAL: 270 ms

## 2014-02-06 NOTE — Progress Notes (Signed)
ICD check in office with [employer]. 

## 2014-02-19 ENCOUNTER — Encounter: Payer: Self-pay | Admitting: Internal Medicine

## 2014-03-25 ENCOUNTER — Encounter: Payer: Self-pay | Admitting: Cardiology

## 2014-03-25 ENCOUNTER — Ambulatory Visit (INDEPENDENT_AMBULATORY_CARE_PROVIDER_SITE_OTHER): Payer: Medicare Other | Admitting: Cardiology

## 2014-03-25 VITALS — BP 137/84 | HR 59 | Ht 71.0 in | Wt 178.0 lb

## 2014-03-25 DIAGNOSIS — I428 Other cardiomyopathies: Secondary | ICD-10-CM

## 2014-03-25 DIAGNOSIS — I2510 Atherosclerotic heart disease of native coronary artery without angina pectoris: Secondary | ICD-10-CM

## 2014-03-25 DIAGNOSIS — I5022 Chronic systolic (congestive) heart failure: Secondary | ICD-10-CM

## 2014-03-25 NOTE — Progress Notes (Signed)
HPI The patient returns for followup. Since I last saw him he has done well. He denies any chest discomfort, neck or arm discomfort. He is not noticing any shortness of breath, PND or orthopnea. He has had no palpitations, presyncope or syncope. He denies any chest pressure, neck or arm discomfort. He has had no edema. He still volunteers as a Company secretaryfireman.     No Known Allergies  Current Outpatient Prescriptions  Medication Sig Dispense Refill  . acetaminophen (TYLENOL) 500 MG tablet Take 500 mg by mouth every 6 (six) hours as needed for pain.      Marland Kitchen. aspirin 81 MG tablet Take 81 mg by mouth at bedtime.       . bisoprolol-hydrochlorothiazide (ZIAC) 10-6.25 MG per tablet Take 1 tablet by mouth daily.  30 tablet  11  . fish oil-omega-3 fatty acids 1000 MG capsule Take 1 g by mouth 3 (three) times daily.       Marland Kitchen. ibuprofen (ADVIL,MOTRIN) 200 MG tablet Take 200 mg by mouth every 6 (six) hours as needed for pain.      . niacin (NIASPAN) 500 MG CR tablet Take 500 mg by mouth at bedtime.        . nitroGLYCERIN (NITROSTAT) 0.4 MG SL tablet Place 1 tablet (0.4 mg total) under the tongue every 5 (five) minutes as needed.  25 tablet  3  . omeprazole (PRILOSEC) 20 MG capsule Take 20 mg by mouth daily.       . rosuvastatin (CRESTOR) 20 MG tablet Take 40 mg by mouth at bedtime.       . enalapril (VASOTEC) 20 MG tablet Take 0.5 tablets (10 mg total) by mouth daily.       No current facility-administered medications for this visit.    Past Medical History  Diagnosis Date  . Left ventricular dysfunction     apical thrombus  . CHF (congestive heart failure)     EF 20%  . GI bleed     Mallory-Weiss tear EGD 02/2010  . Small bowel obstruction 2007    resolved without surgery  . Ileus 2006    required hospitalization  . Hyperlipidemia   . MRSA bacteremia   . Coronary artery disease   . 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 Jude    Past Surgical  History  Procedure Laterality Date  . Coronary artery bypass graft      6 vessels, 2000  . Insert / replace / remove pacemaker  01/05/10    ICD insertion/St. Jude single chamber  . Basilic vein left arm resection      ROS  As stated in the HPI and negative for all other systems.   PHYSICAL EXAM BP 137/84  Pulse 59  Ht 5\' 11"  (1.803 m)  Wt 178 lb (80.74 kg)  BMI 24.84 kg/m2 GENERAL:  Well appearing NECK:  No jugular venous distention, waveform within normal limits, carotid upstroke brisk and symmetric, no bruits, no thyromegaly LYMPHATICS:  No cervical, inguinal adenopathy LUNGS:  Clear to auscultation bilaterally BACK:  No CVA tenderness CHEST:  Well healed sternotomy scar, ICD scar HEART:  PMI not displaced or sustained,S1 and S2 within normal limits, no S3, no S4, no clicks, no rubs, no murmurs ABD:  Flat, positive bowel sounds normal in frequency in pitch, no bruits, no rebound, no guarding, no midline pulsatile mass, no hepatomegaly, no splenomegaly EXT:  2 plus pulses throughout, trace edema, no cyanosis no clubbinga Skin:  No rash no nodules  EKG:  Sinus rhythm, rate 62, axis within normal limits, intervals within normal limits, no acute ST-T wave changes.  03/25/2014   ASSESSMENT AND PLAN  HYPOTENSION - This has not been a problem recently.  However, since he did have this issue I will not up titrate his medications.    Coronary artery disease -  The patient has no new sypmtoms. No further cardiovascular testing is indicated. We will continue with aggressive risk reduction and meds as listed.  CARDIOMYOPATHY -  Joseph [name] seems to be euvolemic. At this point, no change in therapy is indicated..   ICD - He is up-to-date with followup.

## 2014-03-25 NOTE — Patient Instructions (Signed)
The current medical regimen is effective;  continue present plan and medications.  Follow up in 6 months with Dr Hochrein.  You will receive a letter in the mail 2 months before you are due.  Please call us when you receive this letter to schedule your follow up appointment.  

## 2014-04-02 ENCOUNTER — Telehealth: Payer: Self-pay | Admitting: Cardiology

## 2014-04-02 DIAGNOSIS — I2510 Atherosclerotic heart disease of native coronary artery without angina pectoris: Secondary | ICD-10-CM

## 2014-04-02 MED ORDER — NITROGLYCERIN 0.4 MG SL SUBL: 0.4000 mg | SUBLINGUAL_TABLET | SUBLINGUAL | Status: DC | PRN

## 2014-04-02 NOTE — Telephone Encounter (Signed)
Please send in refill on NTG to [facility] in [place] / [place]

## 2014-04-27 ENCOUNTER — Encounter: Payer: Self-pay | Admitting: Internal Medicine

## 2014-04-27 ENCOUNTER — Ambulatory Visit (INDEPENDENT_AMBULATORY_CARE_PROVIDER_SITE_OTHER): Payer: Medicare Other | Admitting: Internal Medicine

## 2014-04-27 VITALS — BP 130/82 | HR 63 | Ht 71.0 in | Wt 181.0 lb

## 2014-04-27 DIAGNOSIS — I5022 Chronic systolic (congestive) heart failure: Secondary | ICD-10-CM

## 2014-04-27 DIAGNOSIS — Z951 Presence of aortocoronary bypass graft: Secondary | ICD-10-CM

## 2014-04-27 DIAGNOSIS — I2510 Atherosclerotic heart disease of native coronary artery without angina pectoris: Secondary | ICD-10-CM

## 2014-04-27 DIAGNOSIS — Z95810 Presence of automatic (implantable) cardiac defibrillator: Secondary | ICD-10-CM

## 2014-04-27 LAB — MDC_IDC_ENUM_SESS_TYPE_INCLINIC
Date Time Interrogation Session: 20150601083440
HIGH POWER IMPEDANCE MEASURED VALUE: 46.2582
HighPow Impedance: 46 Ohm
Implantable Pulse Generator Serial Number: 747960
Lead Channel Impedance Value: 425 Ohm
Lead Channel Pacing Threshold Amplitude: 0.75 V
Lead Channel Pacing Threshold Pulse Width: 0.4 ms
Lead Channel Pacing Threshold Pulse Width: 0.4 ms
Lead Channel Setting Pacing Pulse Width: 0.4 ms
Lead Channel Setting Sensing Sensitivity: 0.5 mV
MDC IDC MSMT BATTERY REMAINING LONGEVITY: 78 mo
MDC IDC MSMT LEADCHNL RV PACING THRESHOLD AMPLITUDE: 0.75 V
MDC IDC MSMT LEADCHNL RV SENSING INTR AMPL: 12 mV
MDC IDC SET LEADCHNL RV PACING AMPLITUDE: 2.5 V
MDC IDC STAT BRADY RV PERCENT PACED: 0.01 %
Zone Setting Detection Interval: 270 ms

## 2014-04-27 NOTE — Progress Notes (Signed)
HPI Joseph [name] returns today for followup. He is a very pleasant 58 year old man with an ischemic cardiomyopathy, chronic systolic heart failure, ejection fraction 25%, status post ICD implantation. In the interim, he has done well. He denies chest pain, shortness of breath, fevers, chills, or syncope. No peripheral edema. He had one episode of bronchitis in the spring which resolved.  No Known Allergies   Current Outpatient Prescriptions  Medication Sig Dispense Refill  . acetaminophen (TYLENOL) 500 MG tablet Take 500 mg by mouth every 6 (six) hours as needed for pain.      Marland Kitchen aspirin 81 MG tablet Take 81 mg by mouth at bedtime.       . bisoprolol-hydrochlorothiazide (ZIAC) 10-6.25 MG per tablet Take 1 tablet by mouth daily.  30 tablet  11  . enalapril (VASOTEC) 20 MG tablet Take 0.5 tablets (10 mg total) by mouth daily.      . fish oil-omega-3 fatty acids 1000 MG capsule Take 1 g by mouth 3 (three) times daily.       Marland Kitchen ibuprofen (ADVIL,MOTRIN) 200 MG tablet Take 200 mg by mouth every 6 (six) hours as needed for pain.      . niacin (NIASPAN) 500 MG CR tablet Take 500 mg by mouth at bedtime.        . nitroGLYCERIN (NITROSTAT) 0.4 MG SL tablet Place 1 tablet (0.4 mg total) under the tongue every 5 (five) minutes as needed.  25 tablet  4  . omeprazole (PRILOSEC) 20 MG capsule Take 20 mg by mouth daily.       . rosuvastatin (CRESTOR) 20 MG tablet Take 40 mg by mouth at bedtime.        No current facility-administered medications for this visit.     Past Medical History  Diagnosis Date  . Left ventricular dysfunction     apical thrombus  . CHF (congestive heart failure)     EF 20%  . GI bleed     Mallory-Weiss tear EGD 02/2010  . Small bowel obstruction 2007    resolved without surgery  . Ileus 2006    required hospitalization  . Hyperlipidemia   . MRSA bacteremia   . Coronary artery disease   . 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  Jude    ROS:   All systems reviewed and negative except as noted in the HPI.   Past Surgical History  Procedure Laterality Date  . Coronary artery bypass graft      6 vessels, 2000  . Insert / replace / remove pacemaker  01/05/10    ICD insertion/St. Jude single chamber  . Basilic vein left arm resection       Family History  Problem Relation Age of Onset  . Heart attack Mother   . Emphysema Father   . Coronary artery disease Father     s/p cabg     History   Social History  . Marital Status: Married    Spouse Name: N/A    Number of Children: 3  . 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 Smoking status: Former Smoker    Quit date: 02/29/1988  . Smokeless tobacco: Never Used  . Alcohol Use: No  . Drug Use: No  . Sexual Activity: No   Other Topics Concern  . 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 HEART:  Regular rate rhythm, no murmurs, no rubs, no clicks Abd:  soft, positive bowel sounds, no organomegally, no rebound, no guarding Ext:  2 plus pulses, no edema, no cyanosis, no clubbing Skin:  No rashes no nodules Neuro:  CN II through XII intact, motor grossly intact  DEVICE  Normal device function.  See PaceArt for details.   Assess/Plan:

## 2014-04-27 NOTE — Assessment & Plan Note (Signed)
He denies anginal symptoms and remains active. Will follow.

## 2014-04-27 NOTE — Assessment & Plan Note (Signed)
His symptoms remain class 2A. He has had some elevation in his fluid monitor which has resolved. He is encouraged to maintain a low sodium diet. He will continue his current meds.

## 2014-04-27 NOTE — Assessment & Plan Note (Signed)
His St. Jude device is working normally. Will recheck in several months.  

## 2014-04-27 NOTE — Patient Instructions (Signed)
Your physician recommends that you schedule a follow-up appointment in:  1 year with Dr Ladona Ridgel and device clinic in 3 months You will receive a reminder letter two months in advance reminding you to call and schedule your appointment. If you don't receive this letter, please contact our office.     .Your physician recommends that you continue on your current medications as directed. Please refer to the Current Medication list given to you today.

## 2014-06-29 ENCOUNTER — Telehealth: Payer: Self-pay | Admitting: Cardiology

## 2014-06-29 ENCOUNTER — Other Ambulatory Visit: Payer: Self-pay

## 2014-06-29 MED ORDER — ENALAPRIL MALEATE 20 MG PO TABS: 10.0000 mg | ORAL_TABLET | Freq: Every day | ORAL | Status: DC

## 2014-06-29 NOTE — Telephone Encounter (Signed)
Received fax refill request  Rx # [id number] Medication:  Enalapril 20 mg tab Qty 30 Sig:  Take one tablet by mouth daily Physician:  Antoine Poche

## 2014-08-05 DIAGNOSIS — K219 Gastro-esophageal reflux disease without esophagitis: Secondary | ICD-10-CM | POA: Insufficient documentation

## 2014-08-05 DIAGNOSIS — I2510 Atherosclerotic heart disease of native coronary artery without angina pectoris: Secondary | ICD-10-CM | POA: Insufficient documentation

## 2014-08-05 DIAGNOSIS — I10 Essential (primary) hypertension: Secondary | ICD-10-CM | POA: Insufficient documentation

## 2014-08-07 ENCOUNTER — Ambulatory Visit (INDEPENDENT_AMBULATORY_CARE_PROVIDER_SITE_OTHER): Payer: Medicare Other | Admitting: *Deleted

## 2014-08-07 DIAGNOSIS — I5022 Chronic systolic (congestive) heart failure: Secondary | ICD-10-CM

## 2014-08-07 DIAGNOSIS — I428 Other cardiomyopathies: Secondary | ICD-10-CM

## 2014-08-07 LAB — MDC_IDC_ENUM_SESS_TYPE_INCLINIC
Date Time Interrogation Session: 20150911083901
HIGH POWER IMPEDANCE MEASURED VALUE: 50 Ohm
HIGH POWER IMPEDANCE MEASURED VALUE: 50.0253
Implantable Pulse Generator Serial Number: 747960
Lead Channel Impedance Value: 487.5 Ohm
Lead Channel Pacing Threshold Amplitude: 0.75 V
Lead Channel Pacing Threshold Amplitude: 0.75 V
Lead Channel Pacing Threshold Pulse Width: 0.4 ms
Lead Channel Setting Pacing Amplitude: 2.5 V
Lead Channel Setting Sensing Sensitivity: 0.5 mV
MDC IDC MSMT BATTERY REMAINING LONGEVITY: 75.6 mo
MDC IDC MSMT LEADCHNL RV PACING THRESHOLD PULSEWIDTH: 0.4 ms
MDC IDC MSMT LEADCHNL RV SENSING INTR AMPL: 12 mV
MDC IDC SET LEADCHNL RV PACING PULSEWIDTH: 0.4 ms
MDC IDC SET ZONE DETECTION INTERVAL: 270 ms
MDC IDC STAT BRADY RV PERCENT PACED: 0.02 %

## 2014-08-07 NOTE — Progress Notes (Signed)
ICD check in office with [employer]. 

## 2014-08-18 ENCOUNTER — Encounter: Payer: Self-pay | Admitting: Internal Medicine

## 2014-10-26 ENCOUNTER — Other Ambulatory Visit: Payer: Self-pay | Admitting: *Deleted

## 2014-10-26 ENCOUNTER — Telehealth: Payer: Self-pay | Admitting: Cardiology

## 2014-10-26 MED ORDER — BISOPROLOL-HYDROCHLOROTHIAZIDE 10-6.25 MG PO TABS: 1.0000 | ORAL_TABLET | Freq: Every day | ORAL | Status: DC

## 2014-10-26 NOTE — Telephone Encounter (Signed)
Needs refill on Bisoprolol sent to [facility] pharmacy in [place] / [place]

## 2014-11-05 ENCOUNTER — Ambulatory Visit (INDEPENDENT_AMBULATORY_CARE_PROVIDER_SITE_OTHER): Payer: Medicare Other | Admitting: *Deleted

## 2014-11-05 DIAGNOSIS — I5022 Chronic systolic (congestive) heart failure: Secondary | ICD-10-CM

## 2014-11-05 LAB — MDC_IDC_ENUM_SESS_TYPE_INCLINIC
HIGH POWER IMPEDANCE MEASURED VALUE: 49.1495
Lead Channel Impedance Value: 487.5 Ohm
Lead Channel Pacing Threshold Amplitude: 0.75 V
Lead Channel Sensing Intrinsic Amplitude: 12 mV
MDC IDC MSMT BATTERY REMAINING LONGEVITY: 73.2 mo
MDC IDC MSMT LEADCHNL RV PACING THRESHOLD AMPLITUDE: 0.75 V
MDC IDC MSMT LEADCHNL RV PACING THRESHOLD PULSEWIDTH: 0.4 ms
MDC IDC MSMT LEADCHNL RV PACING THRESHOLD PULSEWIDTH: 0.4 ms
MDC IDC PG SERIAL: 747960
MDC IDC SESS DTM: 20151210085533
MDC IDC SET LEADCHNL RV PACING AMPLITUDE: 2.5 V
MDC IDC SET LEADCHNL RV PACING PULSEWIDTH: 0.4 ms
MDC IDC SET LEADCHNL RV SENSING SENSITIVITY: 0.5 mV
MDC IDC SET ZONE DETECTION INTERVAL: 270 ms
MDC IDC STAT BRADY RV PERCENT PACED: 0.06 %

## 2014-11-05 NOTE — Progress Notes (Signed)
ICD check in office with [employer]. 

## 2014-11-13 ENCOUNTER — Encounter: Payer: Self-pay | Admitting: Internal Medicine

## 2014-12-02 ENCOUNTER — Encounter: Payer: Self-pay | Admitting: Cardiology

## 2014-12-02 ENCOUNTER — Ambulatory Visit (INDEPENDENT_AMBULATORY_CARE_PROVIDER_SITE_OTHER): Payer: Medicare HMO | Admitting: Cardiology

## 2014-12-02 VITALS — BP 138/88 | HR 62 | Ht 71.0 in | Wt 186.0 lb

## 2014-12-02 DIAGNOSIS — I2510 Atherosclerotic heart disease of native coronary artery without angina pectoris: Secondary | ICD-10-CM

## 2014-12-02 NOTE — Patient Instructions (Signed)
The current medical regimen is effective;  continue present plan and medications.  Follow up in 6 months with Dr Hochrein.  You will receive a letter in the mail 2 months before you are due.  Please call us when you receive this letter to schedule your follow up appointment.  

## 2014-12-02 NOTE — Progress Notes (Signed)
HPI The patient returns for followup. Since I last saw him he has done well. He denies any chest discomfort, neck or arm discomfort. He is not noticing any shortness of breath, PND or orthopnea. He has had no palpitations, presyncope or syncope. He denies any chest pressure, neck or arm discomfort. He has had no edema. He still volunteers as a Company secretary.  I did review his impedance from his ICD interrogation.  It did suggest possible slight volume excess in Nov but he had no symptoms with this and it resolved.     No Known Allergies  Current Outpatient Prescriptions  Medication Sig Dispense Refill  . acetaminophen (TYLENOL) 500 MG tablet Take 500 mg by mouth every 6 (six) hours as needed for pain.    Marland Kitchen aspirin 81 MG tablet Take 81 mg by mouth at bedtime.     . bisoprolol-hydrochlorothiazide (ZIAC) 10-6.25 MG per tablet Take 1 tablet by mouth daily. 30 tablet 6  . enalapril (VASOTEC) 20 MG tablet Take 0.5 tablets (10 mg total) by mouth daily. 30 tablet 2  . fish oil-omega-3 fatty acids 1000 MG capsule Take 1 g by mouth 3 (three) times daily.     . niacin (NIASPAN) 500 MG CR tablet Take 500 mg by mouth at bedtime.      Marland Kitchen omeprazole (PRILOSEC) 20 MG capsule Take 20 mg by mouth daily.     . rosuvastatin (CRESTOR) 20 MG tablet Take 40 mg by mouth at bedtime.     . nitroGLYCERIN (NITROSTAT) 0.4 MG SL tablet Place 1 tablet (0.4 mg total) under the tongue every 5 (five) minutes as needed. (Patient not taking: Reported on 12/02/2014) 25 tablet 4   No current facility-administered medications for this visit.    Past Medical History  Diagnosis Date  . Left ventricular dysfunction     apical thrombus  . CHF (congestive heart failure)     EF 20%  . GI bleed     Mallory-Weiss tear EGD 02/2010  . Small bowel obstruction 2007    resolved without surgery  . Ileus 2006    required hospitalization  . Hyperlipidemia   . MRSA bacteremia   . Coronary artery disease   . 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 Jude    Past Surgical History  Procedure Laterality Date  . Coronary artery bypass graft      6 vessels, 2000  . Insert / replace / remove pacemaker  01/05/10    ICD insertion/St. Jude single chamber  . Basilic vein left arm resection      ROS  Recent bronchitis.  Otherwise stated in the HPI and negative for all other systems.   PHYSICAL EXAM BP 138/88 mmHg  Pulse 62  Ht  (1.803 m)  Wt 186 lb (84.369 kg)  BMI 25.95 kg/m2 GENERAL:  Well appearing NECK:  No jugular venous distention, waveform within normal limits, carotid upstroke brisk and symmetric, no bruits, no thyromegaly LYMPHATICS:  No cervical, inguinal adenopathy LUNGS:  Clear to auscultation bilaterally BACK:  No CVA tenderness CHEST:  Well healed sternotomy scar, ICD scar HEART:  PMI not displaced or sustained,S1 and S2 within normal limits, no S3, no S4, no clicks, no rubs, no murmurs ABD:  Flat, positive bowel sounds normal in frequency in pitch, no bruits, no rebound, no guarding, no midline pulsatile mass, no hepatomegaly, no splenomegaly EXT:  2 plus pulses throughout, trace edema, no cyanosis no clubbinga Skin:  No  rash no nodules   ASSESSMENT AND PLAN   Coronary artery disease -  The patient has no new sypmtoms. No further cardiovascular testing is indicated. We will continue with aggressive risk reduction and meds as listed.  CARDIOMYOPATHY -  Mr. Hammer seems to be euvolemic. At this point, no change in therapy is indicated..   ICD - He is up-to-date with followup.

## 2015-01-15 ENCOUNTER — Encounter (HOSPITAL_COMMUNITY): Payer: Self-pay | Admitting: Emergency Medicine

## 2015-01-15 ENCOUNTER — Emergency Department (HOSPITAL_COMMUNITY): Payer: Medicare HMO

## 2015-01-15 ENCOUNTER — Inpatient Hospital Stay (HOSPITAL_COMMUNITY)
Admission: EM | Admit: 2015-01-15 | Discharge: 2015-01-18 | DRG: 293 | Disposition: A | Discharge status: Home / Self Care | Payer: Medicare HMO | Attending: Internal Medicine | Admitting: Internal Medicine

## 2015-01-15 DIAGNOSIS — Z8249 Family history of ischemic heart disease and other diseases of the circulatory system: Secondary | ICD-10-CM

## 2015-01-15 DIAGNOSIS — I10 Essential (primary) hypertension: Secondary | ICD-10-CM | POA: Diagnosis present

## 2015-01-15 DIAGNOSIS — I5022 Chronic systolic (congestive) heart failure: Secondary | ICD-10-CM | POA: Diagnosis present

## 2015-01-15 DIAGNOSIS — I5023 Acute on chronic systolic (congestive) heart failure: Principal | ICD-10-CM | POA: Diagnosis present

## 2015-01-15 DIAGNOSIS — Z825 Family history of asthma and other chronic lower respiratory diseases: Secondary | ICD-10-CM

## 2015-01-15 DIAGNOSIS — I2510 Atherosclerotic heart disease of native coronary artery without angina pectoris: Secondary | ICD-10-CM | POA: Diagnosis present

## 2015-01-15 DIAGNOSIS — Z87891 Personal history of nicotine dependence: Secondary | ICD-10-CM

## 2015-01-15 DIAGNOSIS — R0602 Shortness of breath: Secondary | ICD-10-CM | POA: Diagnosis not present

## 2015-01-15 DIAGNOSIS — E1159 Type 2 diabetes mellitus with other circulatory complications: Secondary | ICD-10-CM | POA: Insufficient documentation

## 2015-01-15 DIAGNOSIS — Z951 Presence of aortocoronary bypass graft: Secondary | ICD-10-CM

## 2015-01-15 DIAGNOSIS — I152 Hypertension secondary to endocrine disorders: Secondary | ICD-10-CM | POA: Insufficient documentation

## 2015-01-15 DIAGNOSIS — E785 Hyperlipidemia, unspecified: Secondary | ICD-10-CM | POA: Diagnosis present

## 2015-01-15 DIAGNOSIS — Z7982 Long term (current) use of aspirin: Secondary | ICD-10-CM

## 2015-01-15 DIAGNOSIS — I509 Heart failure, unspecified: Secondary | ICD-10-CM

## 2015-01-15 DIAGNOSIS — I5021 Acute systolic (congestive) heart failure: Secondary | ICD-10-CM | POA: Diagnosis present

## 2015-01-15 DIAGNOSIS — Z95810 Presence of automatic (implantable) cardiac defibrillator: Secondary | ICD-10-CM | POA: Diagnosis present

## 2015-01-15 LAB — CBC WITH DIFFERENTIAL/PLATELET
BASOS PCT: 0 % (ref 0–1)
Basophils Absolute: 0 10*3/uL (ref 0.0–0.1)
EOS ABS: 0.1 10*3/uL (ref 0.0–0.7)
EOS PCT: 1 % (ref 0–5)
HEMATOCRIT: 46.5 % (ref 39.0–52.0)
Hemoglobin: 15.7 g/dL (ref 13.0–17.0)
Lymphocytes Relative: 26 % (ref 12–46)
Lymphs Abs: 2.7 10*3/uL (ref 0.7–4.0)
MCH: 30.7 pg (ref 26.0–34.0)
MCHC: 33.8 g/dL (ref 30.0–36.0)
MCV: 91 fL (ref 78.0–100.0)
MONO ABS: 0.6 10*3/uL (ref 0.1–1.0)
Monocytes Relative: 6 % (ref 3–12)
NEUTROS ABS: 7.1 10*3/uL (ref 1.7–7.7)
NEUTROS PCT: 67 % (ref 43–77)
Platelets: 229 10*3/uL (ref 150–400)
RBC: 5.11 MIL/uL (ref 4.22–5.81)
RDW: 13.9 % (ref 11.5–15.5)
WBC: 10.4 10*3/uL (ref 4.0–10.5)

## 2015-01-15 LAB — COMPREHENSIVE METABOLIC PANEL
ALK PHOS: 93 U/L (ref 39–117)
ALT: 30 U/L (ref 0–53)
AST: 21 U/L (ref 0–37)
Albumin: 3.7 g/dL (ref 3.5–5.2)
Anion gap: 4 — ABNORMAL LOW (ref 5–15)
BUN: 18 mg/dL (ref 6–23)
CHLORIDE: 107 mmol/L (ref 96–112)
CO2: 26 mmol/L (ref 19–32)
Calcium: 8.6 mg/dL (ref 8.4–10.5)
Creatinine, Ser: 1.17 mg/dL (ref 0.50–1.35)
GFR, EST AFRICAN AMERICAN: 76 mL/min — AB (ref >90)
GFR, EST NON AFRICAN AMERICAN: 66 mL/min — AB (ref >90)
Glucose, Bld: 137 mg/dL — ABNORMAL HIGH (ref 70–99)
Potassium: 3.9 mmol/L (ref 3.5–5.1)
Sodium: 137 mmol/L (ref 135–145)
TOTAL PROTEIN: 7.1 g/dL (ref 6.0–8.3)
Total Bilirubin: 0.7 mg/dL (ref 0.3–1.2)

## 2015-01-15 LAB — TROPONIN I: Troponin I: <0.03 ng/mL (ref <0.031)

## 2015-01-15 LAB — BRAIN NATRIURETIC PEPTIDE: B NATRIURETIC PEPTIDE 5: 428 pg/mL — AB (ref 0.0–100.0)

## 2015-01-15 MED ORDER — FUROSEMIDE 10 MG/ML IJ SOLN
40.0000 mg | Freq: Once | INTRAMUSCULAR | Status: AC
  Administered 2015-01-15: 40 mg via INTRAVENOUS
  Filled 2015-01-15: qty 4

## 2015-01-15 MED ORDER — NITROGLYCERIN 2 % TD OINT
1.0000 [in_us] | TOPICAL_OINTMENT | Freq: Once | TRANSDERMAL | Status: AC
  Administered 2015-01-15: 1 [in_us] via TOPICAL
  Filled 2015-01-15: qty 1

## 2015-01-15 NOTE — ED Notes (Signed)
Patient c/o shortness of breath; states saw Dr. Lysbeth Galas yesterday and was given Zithromax and Mucinex DM for bronchitis.  Patient states shortness of breath became worse tonight.

## 2015-01-15 NOTE — ED Provider Notes (Signed)
CSN: [id number]     Arrival date & time 01/15/15  2135 History   This chart was scribed for Joseph Lennert, MD by Gwenyth Ober, ED Scribe. This patient was seen in room [facility] and the patient's care was started at 9:43 PM.    Chief Complaint  Patient presents with  . Shortness of Breath   Patient is a 59 y.o. male presenting with shortness of breath. The history is provided by the patient. No language interpreter was used.  Shortness of Breath Severity:  Moderate Onset quality:  Gradual Duration:  2 days Timing:  Constant Progression:  Worsening Chronicity:  New Relieved by:  Nothing Worsened by:  Nothing tried Ineffective treatments:  Lying down and sitting up Associated symptoms: cough   Associated symptoms: no abdominal pain, no chest pain, no fever, no headaches and no rash     HPI Comments: Joseph [name] is a 59 y.o. male with a history of CHF, CAD, SBO and GI Bleed who presents to the Emergency Department complaining of constant, gradually worsening SOB that started 2 days ago and became worse last night. He reports productive cough with clear sputum as an associated symptom. Pt states symptoms become worse with lying down. He saw Dr. Lysbeth Galas yesterday who diagnosed him with Bronchitis and prescribed Zithromax and Mucinex DM. He has not tried any other treatments for his symptoms.  Past Medical History  Diagnosis Date  . Left ventricular dysfunction     apical thrombus  . CHF (congestive heart failure)     EF 20%  . GI bleed     Mallory-Weiss tear EGD 02/2010  . Small bowel obstruction 2007    resolved without surgery  . Ileus 2006    required hospitalization  . Hyperlipidemia   . MRSA bacteremia   . Coronary artery disease   . 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 Jude   Past Surgical History  Procedure Laterality Date  . Coronary artery bypass graft      6 vessels, 2000  . Insert / replace / remove  pacemaker  01/05/10    ICD insertion/St. Jude single chamber  . Basilic vein left arm resection     Family History  Problem Relation Age of Onset  . Heart attack Mother   . Emphysema Father   . Coronary artery disease Father     s/p cabg   History  Substance Use Topics  . Smoking status: Former Smoker    Quit date: 02/29/1988  . Smokeless tobacco: Never Used  . Alcohol Use: No    Review of Systems  Constitutional: Negative for fever, appetite change and fatigue.  HENT: Negative for congestion, ear discharge and sinus pressure.   Eyes: Negative for discharge.  Respiratory: Positive for cough and shortness of breath.   Cardiovascular: Negative for chest pain.  Gastrointestinal: Negative for abdominal pain and diarrhea.  Genitourinary: Negative for frequency and hematuria.  Musculoskeletal: Negative for back pain.  Skin: Negative for rash.  Neurological: Negative for seizures and headaches.  Psychiatric/Behavioral: Negative for hallucinations.  All other systems reviewed and are negative.   Allergies  Review of patient's allergies indicates no known allergies.  Home Medications   Prior to Admission medications   Medication Sig Start Date End Date Taking? Authorizing Provider  acetaminophen (TYLENOL) 500 MG tablet Take 500 mg by mouth every 6 (six) hours as needed for pain.    Historical Provider, MD  aspirin 81 MG tablet Take  81 mg by mouth at bedtime.     Historical Provider, MD  bisoprolol-hydrochlorothiazide [facility]) 10-6.25 MG per tablet Take 1 tablet by mouth daily. 10/26/14   Marinus Maw, MD  enalapril (VASOTEC) 20 MG tablet Take 0.5 tablets (10 mg total) by mouth daily. 06/29/14   Rollene Rotunda, MD  fish oil-omega-3 fatty acids 1000 MG capsule Take 1 g by mouth 3 (three) times daily.     Historical Provider, MD  niacin (NIASPAN) 500 MG CR tablet Take 500 mg by mouth at bedtime.      Historical Provider, MD  nitroGLYCERIN (NITROSTAT) 0.4 MG SL tablet Place 1 tablet  (0.4 mg total) under the tongue every 5 (five) minutes as needed. Patient not taking: Reported on 12/02/2014 04/02/14   Rollene Rotunda, MD  omeprazole (PRILOSEC) 20 MG capsule Take 20 mg by mouth daily.     Historical Provider, MD  rosuvastatin (CRESTOR) 20 MG tablet Take 40 mg by mouth at bedtime.     Historical Provider, MD   BP 192/112 mmHg  Pulse 84  Temp(Src) 97.6 F (36.4 C) (Oral)  Resp 23  SpO2 98% Physical Exam  Constitutional: He is oriented to person, place, and time. He appears well-developed.  HENT:  Head: Normocephalic.  Eyes: Conjunctivae and EOM are normal. No scleral icterus.  Neck: Neck supple. No thyromegaly present.  Cardiovascular: Normal rate and regular rhythm.  Exam reveals no gallop and no friction rub.   No murmur heard. Pulmonary/Chest: No stridor. He has no wheezes. He has no rales. He exhibits no tenderness.  Abdominal: He exhibits no distension. There is no tenderness. There is no rebound.  Musculoskeletal: Normal range of motion. He exhibits no edema.  Lymphadenopathy:    He has no cervical adenopathy.  Neurological: He is oriented to person, place, and time. He exhibits normal muscle tone. Coordination normal.  Skin: No rash noted. No erythema.  Psychiatric: He has a normal mood and affect. His behavior is normal.  Nursing note and vitals reviewed.   ED Course  Procedures (including critical care time) DIAGNOSTIC STUDIES: Oxygen Saturation is 98% on RA, normal by my interpretation.    COORDINATION OF CARE: 9:51 PM Discussed treatment plan with pt at bedside and pt agreed to plan.  Labs Review Labs Reviewed - No data to display  Imaging Review No results found.   EKG Interpretation   Date/Time:  Friday January 15 2015 21:48:23 EST Ventricular Rate:  84 PR Interval:  175 QRS Duration: 90 QT Interval:  375 QTC Calculation: 443 R Axis:   145 Text Interpretation:  Right and left arm electrode reversal,  interpretation assumes no reversal  Sinus rhythm Left atrial enlargement  Probable lateral infarct, age indeterminate Anteroseptal infarct, old When  compared with ECG of july 2014, lateral ST depressions are now present  Confirmed by POLLINA  MD, CHRISTOPHER [phone]) on 01/15/2015 10:11:01 PM     CRITICAL CARE Performed by: Naitik Hermann L Total critical care time: 35 Critical care time was exclusive of separately billable procedures and treating other patients. Critical care was necessary to treat or prevent imminent or life-threatening deterioration. Critical care was time spent personally by me on the following activities: development of treatment plan with patient and/or surrogate as well as nursing, discussions with consultants, evaluation of patient's response to treatment, examination of patient, obtaining history from patient or surrogate, ordering and performing treatments and interventions, ordering and review of laboratory studies, ordering and review of radiographic studies, pulse oximetry and re-evaluation of  patient's condition.  MDM   Final diagnoses:  None    Chf,  Admit    The chart was scribed for me under my direct supervision.  I personally performed the history, physical, and medical decision making and all procedures in the evaluation of this patient.Joseph Lennert, MD 01/15/15 [phone]

## 2015-01-16 ENCOUNTER — Encounter (HOSPITAL_COMMUNITY): Payer: Self-pay | Admitting: Internal Medicine

## 2015-01-16 DIAGNOSIS — Z95810 Presence of automatic (implantable) cardiac defibrillator: Secondary | ICD-10-CM

## 2015-01-16 DIAGNOSIS — I2510 Atherosclerotic heart disease of native coronary artery without angina pectoris: Secondary | ICD-10-CM | POA: Diagnosis present

## 2015-01-16 DIAGNOSIS — Z7982 Long term (current) use of aspirin: Secondary | ICD-10-CM | POA: Diagnosis not present

## 2015-01-16 DIAGNOSIS — E785 Hyperlipidemia, unspecified: Secondary | ICD-10-CM | POA: Diagnosis present

## 2015-01-16 DIAGNOSIS — I10 Essential (primary) hypertension: Secondary | ICD-10-CM | POA: Diagnosis present

## 2015-01-16 DIAGNOSIS — Z951 Presence of aortocoronary bypass graft: Secondary | ICD-10-CM | POA: Diagnosis not present

## 2015-01-16 DIAGNOSIS — Z8249 Family history of ischemic heart disease and other diseases of the circulatory system: Secondary | ICD-10-CM | POA: Diagnosis not present

## 2015-01-16 DIAGNOSIS — R0602 Shortness of breath: Secondary | ICD-10-CM | POA: Diagnosis present

## 2015-01-16 DIAGNOSIS — Z825 Family history of asthma and other chronic lower respiratory diseases: Secondary | ICD-10-CM | POA: Diagnosis not present

## 2015-01-16 DIAGNOSIS — I509 Heart failure, unspecified: Secondary | ICD-10-CM

## 2015-01-16 DIAGNOSIS — I5023 Acute on chronic systolic (congestive) heart failure: Principal | ICD-10-CM

## 2015-01-16 DIAGNOSIS — I5022 Chronic systolic (congestive) heart failure: Secondary | ICD-10-CM

## 2015-01-16 DIAGNOSIS — I5021 Acute systolic (congestive) heart failure: Secondary | ICD-10-CM | POA: Diagnosis present

## 2015-01-16 DIAGNOSIS — Z87891 Personal history of nicotine dependence: Secondary | ICD-10-CM | POA: Diagnosis not present

## 2015-01-16 LAB — MRSA PCR SCREENING: MRSA by PCR: NEGATIVE

## 2015-01-16 LAB — TSH: TSH: 0.567 u[IU]/mL (ref 0.350–4.500)

## 2015-01-16 MED ORDER — FUROSEMIDE 10 MG/ML IJ SOLN
40.0000 mg | Freq: Two times a day (BID) | INTRAMUSCULAR | Status: DC
  Filled 2015-01-16: qty 4

## 2015-01-16 MED ORDER — ENALAPRIL MALEATE 5 MG PO TABS
10.0000 mg | ORAL_TABLET | Freq: Every day | ORAL | Status: DC
  Administered 2015-01-16 – 2015-01-18 (×3): 10 mg via ORAL
  Filled 2015-01-16 (×3): qty 2

## 2015-01-16 MED ORDER — BISOPROLOL-HYDROCHLOROTHIAZIDE 10-6.25 MG PO TABS
1.0000 | ORAL_TABLET | Freq: Every day | ORAL | Status: DC
  Administered 2015-01-16 – 2015-01-18 (×3): 1 via ORAL
  Filled 2015-01-16 (×5): qty 1

## 2015-01-16 MED ORDER — NIACIN ER (ANTIHYPERLIPIDEMIC) 500 MG PO TBCR
500.0000 mg | EXTENDED_RELEASE_TABLET | Freq: Every day | ORAL | Status: DC
  Administered 2015-01-16 – 2015-01-17 (×2): 500 mg via ORAL
  Filled 2015-01-16 (×4): qty 1

## 2015-01-16 MED ORDER — HEPARIN SODIUM (PORCINE) 5000 UNIT/ML IJ SOLN
5000.0000 [IU] | Freq: Three times a day (TID) | INTRAMUSCULAR | Status: DC
  Administered 2015-01-16 – 2015-01-18 (×7): 5000 [IU] via SUBCUTANEOUS
  Filled 2015-01-16 (×7): qty 1

## 2015-01-16 MED ORDER — ASPIRIN EC 81 MG PO TBEC
81.0000 mg | DELAYED_RELEASE_TABLET | Freq: Every day | ORAL | Status: DC
  Administered 2015-01-16 – 2015-01-17 (×2): 81 mg via ORAL
  Filled 2015-01-16 (×2): qty 1

## 2015-01-16 MED ORDER — FUROSEMIDE 10 MG/ML IJ SOLN
40.0000 mg | Freq: Two times a day (BID) | INTRAMUSCULAR | Status: DC
  Administered 2015-01-16 – 2015-01-18 (×5): 40 mg via INTRAVENOUS
  Filled 2015-01-16 (×4): qty 4

## 2015-01-16 MED ORDER — POTASSIUM CHLORIDE CRYS ER 10 MEQ PO TBCR
10.0000 meq | EXTENDED_RELEASE_TABLET | Freq: Every day | ORAL | Status: DC
  Administered 2015-01-16 – 2015-01-18 (×3): 10 meq via ORAL
  Filled 2015-01-16 (×3): qty 1

## 2015-01-16 MED ORDER — FUROSEMIDE 10 MG/ML IJ SOLN: 40.0000 mg | Freq: Every day | INTRAMUSCULAR | Status: DC

## 2015-01-16 MED ORDER — PANTOPRAZOLE SODIUM 40 MG PO TBEC
40.0000 mg | DELAYED_RELEASE_TABLET | Freq: Every day | ORAL | Status: DC
  Administered 2015-01-16 – 2015-01-18 (×3): 40 mg via ORAL
  Filled 2015-01-16 (×3): qty 1

## 2015-01-16 MED ORDER — OMEGA-3-ACID ETHYL ESTERS 1 G PO CAPS
1000.0000 mg | ORAL_CAPSULE | Freq: Three times a day (TID) | ORAL | Status: DC
  Administered 2015-01-16 – 2015-01-18 (×8): 1000 mg via ORAL
  Filled 2015-01-16 (×8): qty 1

## 2015-01-16 MED ORDER — ROSUVASTATIN CALCIUM 20 MG PO TABS
40.0000 mg | ORAL_TABLET | Freq: Every day | ORAL | Status: DC
  Administered 2015-01-16 – 2015-01-17 (×2): 40 mg via ORAL
  Filled 2015-01-16 (×2): qty 2

## 2015-01-16 NOTE — Progress Notes (Signed)
Patient ID: HEMI SCHULENBURG, male   DOB: [birth year], 59 y.o.   MRN: [id number] [facility] PROGRESS NOTE  DANARI OLUND LID:[id number] DOB: [birth year] DOA: 01/15/2015 PCP: Josue Hector, MD  Brief narrative:    Addendum to admission note done 01/16/2015 59 y.o. male with past medical history of chronic systolic CHF with EF 20 percent, CAD s/p CABG x 6, AIICD, history of GI bleed who presented to [facility] ED with worsening DOE, orthopnea, and bilateral pedal edema.CXR on admission showed CHF. BNP was  428. Troponin was WNL. He was started on IV lasix and admitted for further evaluation and management.   Assessment/Plan:    Active Problems: Acute on chronic systolic congestive heart failure / Automatic implantable cardioverter-defibrillator in situ /  Hx of CABG - will continue twice a day lasix 40 mg IV - daily weight and replete electrolytes as needed    DVT Prophylaxis  - Heparin subcutaneous ordered   Code Status: Full.  Family Communication:  plan of care discussed with the patient and his wife at the bedside Disposition Plan: needs another day or so IV lasix and hopefully d/c in next 24 - 48 hours   IV access:  Peripheral IV  Procedures and diagnostic studies:    Dg Chest Portable 1 View2/19/2016   Congestive heart failure   Electronically Signed   By: Ellery Plunk M.D.   On: 01/15/2015 22:09   Medical Consultants:  None   Other Consultants:  None   IAnti-Infectives:   None    Manson Passey, MD  [facility] Pager [phone]  If 7PM-7AM, please contact night-coverage [email or web address] Password [facility] 01/16/2015, 8:42 AM   LOS: 0 days    HPI/Subjective: No acute overnight events.  Objective: Filed Vitals:   01/16/15 0030 01/16/15 0120 01/16/15 0137 01/16/15 0547  BP: 137/92  146/86 141/92  Pulse: 64  64 57  Temp:   98.6 F (37 C) 98.4 F (36.9 C)  TempSrc:   Oral Oral  Resp:   15 15  Height:   5\' 11"  (1.803 m)   Weight:   88.905 kg (196 lb)    SpO2: 98% 98% 98% 100%    Intake/Output Summary (Last 24 hours) at 01/16/15 0842 Last data filed at 01/16/15 0200  Gross per 24 hour  Intake      0 ml  Output    600 ml  Net   -600 ml    Exam:   General:  Pt is alert, follows commands appropriately, not in acute distress  Cardiovascular: Regular rate and rhythm, S1/S2, no murmurs  Respiratory: no wheezing, no crackles, no rhonchi  Abdomen: Soft, non tender, non distended, bowel sounds present  Extremities: +1 LE pitting edema, pulses DP and PT palpable bilaterally  Neuro: Grossly nonfocal  Data Reviewed: Basic Metabolic Panel:  Recent Labs Lab 01/15/15 2155  NA 137  K 3.9  CL 107  CO2 26  GLUCOSE 137*  BUN 18  CREATININE 1.17  CALCIUM 8.6   Liver Function Tests:  Recent Labs Lab 01/15/15 2155  AST 21  ALT 30  ALKPHOS 93  BILITOT 0.7  PROT 7.1  ALBUMIN 3.7   No results for input(s): LIPASE, AMYLASE in the last 168 hours. No results for input(s): AMMONIA in the last 168 hours. CBC:  Recent Labs Lab 01/15/15 2155  WBC 10.4  NEUTROABS 7.1  HGB 15.7  HCT 46.5  MCV 91.0  PLT 229   Cardiac Enzymes:  Recent Labs Lab 01/15/15 2155  TROPONINI <0.03   BNP: Invalid input(s): POCBNP CBG: No results for input(s): GLUCAP in the last 168 hours.  No results found for this or any previous visit (from the past 240 hour(s)).   Scheduled Meds: . aspirin EC  81 mg Oral QHS  . bisoprolol-hydrochlorothiazide  1 tablet Oral Daily  . enalapril  10 mg Oral Daily  . [START ON 01/17/2015] furosemide  40 mg Intravenous Daily  . heparin  5,000 Units Subcutaneous 3 times per day  . niacin  500 mg Oral QHS  . omega-3 acid ethyl esters  1,000 mg Oral TID WC  . pantoprazole  40 mg Oral Daily  . potassium chloride  10 mEq Oral Daily  . rosuvastatin  40 mg Oral QHS   Continuous Infusions:

## 2015-01-16 NOTE — Progress Notes (Signed)
  Echocardiogram 2D Echocardiogram has been performed.  Joseph [name] 01/16/2015, 10:17 AM

## 2015-01-16 NOTE — H&P (Signed)
[facility] History and Physical  Joseph [name] LNZ:[id number] DOB: 10/15/1956    PCP:   Josue Hector, MD   Chief Complaint: SOB for a few days.   HPI: Joseph [name] is an 59 y.o. male with hx of severe CHF EF 20 percent, hx of CAD s/p CABGx6, hx of AICD placement, chronic systolic CHF, followed by Dr Octaviano Batty, last saw him about a month ago with euvolemia doing well, hx of GI Bleed, HLD, presented to the ER tonight with increase DOE, orthopnea, and bilateral pedal edema.   He has been compliant with his meds, and denied any chest pain.  Evaluation in the ER included a CXR showing CHF, BNP of 400, and EKG showed NSR with nonspecific ST flattening over the precordial leads.  He was given IV Lasix and diuresed, felt markedly better.  His troponin was negative.  Hospitlaist was asked to admit him for further diuresing for his acute on chronic systolic CHF>   Rewiew of Systems:  Constitutional: Negative for malaise, fever and chills. No significant weight loss or weight gain Eyes: Negative for eye pain, redness and discharge, diplopia, visual changes, or flashes of light. ENMT: Negative for ear pain, hoarseness, nasal congestion, sinus pressure and sore throat. No headaches; tinnitus, drooling, or problem swallowing. Cardiovascular: Negative for chest pain, palpitations, diaphoresis,  Respiratory: Negative for hemoptysis, wheezing and stridor. No pleuritic chestpain. Gastrointestinal: Negative for nausea, vomiting, diarrhea, constipation, abdominal pain, melena, blood in stool, hematemesis, jaundice and rectal bleeding.    Genitourinary: Negative for frequency, dysuria, incontinence,flank pain and hematuria; Musculoskeletal: Negative for back pain and neck pain. Negative for  trauma.;  Skin: . Negative for pruritus, rash, abrasions, bruising and skin lesion.; ulcerations Neuro: Negative for headache, lightheadedness and neck stiffness. Negative for weakness, altered level of  consciousness , altered mental status, extremity weakness, burning feet, involuntary movement, seizure and syncope.  Psych: negative for anxiety, depression, insomnia, tearfulness, panic attacks, hallucinations, paranoia, suicidal or homicidal ideation    Past Medical History  Diagnosis Date  . Left ventricular dysfunction     apical thrombus  . CHF (congestive heart failure)     EF 20%  . GI bleed     Mallory-Weiss tear EGD 02/2010  . Small bowel obstruction 2007    resolved without surgery  . Ileus 2006    required hospitalization  . Hyperlipidemia   . MRSA bacteremia   . Coronary artery disease   . 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 Jude    Past Surgical History  Procedure Laterality Date  . Coronary artery bypass graft      6 vessels, 2000  . Insert / replace / remove pacemaker  01/05/10    ICD insertion/St. Jude single chamber  . Basilic vein left arm resection      Medications:  HOME MEDS: Prior to Admission medications   Medication Sig Start Date End Date Taking? Authorizing Provider  acetaminophen (TYLENOL) 500 MG tablet Take 500 mg by mouth every 6 (six) hours as needed for pain.   Yes Historical Provider, MD  aspirin 81 MG tablet Take 81 mg by mouth at bedtime.    Yes Historical Provider, MD  azithromycin (ZITHROMAX) 250 MG tablet Take 250-500 mg by mouth See admin instructions. Take 2 tablets (500 mg) on  Day 1,  followed by 1 tablet (250 mg) once daily on Days 2 through 5. 01/14/15 01/19/15 Yes Historical Provider, MD  bisoprolol-hydrochlorothiazide (ZIAC) 10-6.25 MG per  tablet Take 1 tablet by mouth daily. 10/26/14  Yes Marinus Maw, MD  dextromethorphan-[facility] DM) 30-600 MG per 12 hr tablet Take 1 tablet by mouth 2 (two) times daily as needed for cough.   Yes Historical Provider, MD  enalapril (VASOTEC) 20 MG tablet Take 0.5 tablets (10 mg total) by mouth daily. 06/29/14  Yes Rollene Rotunda, MD   fish oil-omega-3 fatty acids 1000 MG capsule Take 1 g by mouth 3 (three) times daily.    Yes Historical Provider, MD  niacin (NIASPAN) 500 MG CR tablet Take 500 mg by mouth at bedtime.     Yes Historical Provider, MD  nitroGLYCERIN (NITROSTAT) 0.4 MG SL tablet Place 1 tablet (0.4 mg total) under the tongue every 5 (five) minutes as needed. 04/02/14  Yes Rollene Rotunda, MD  omeprazole (PRILOSEC) 20 MG capsule Take 20 mg by mouth daily.    Yes Historical Provider, MD  rosuvastatin (CRESTOR) 20 MG tablet Take 40 mg by mouth at bedtime.    Yes Historical Provider, MD     Allergies:  No Known Allergies  Social History:   reports that he quit smoking about 26 years ago. He has never used smokeless tobacco. He reports that he does not drink alcohol or use illicit drugs.  Family History: Family History  Problem Relation Age of Onset  . Heart attack Mother   . Emphysema Father   . Coronary artery disease Father     s/p cabg     Physical Exam: Filed Vitals:   01/15/15 2200 01/15/15 2230 01/15/15 2300 01/15/15 2315  BP: 154/97 148/92 146/98   Pulse: 79 73 67 67  Temp:      TempSrc:      Resp: SpO2: 97% 98% 99% 98%   Blood pressure 146/98, pulse 67, temperature 97.6 F (36.4 C), temperature source Oral, resp. rate 15, SpO2 98 %.  GEN:  Pleasant  patient lying in the stretcher in no acute distress; cooperative with exam. PSYCH:  alert and oriented x4; does not appear anxious or depressed; affect is appropriate. HEENT: Mucous membranes pink and anicteric; PERRLA; EOM intact; no cervical lymphadenopathy nor thyromegaly or carotid bruit; no JVD; There were no stridor. Neck is very supple. Breasts:: Not examined CHEST WALL: No tenderness CHEST: Normal respiration, bibasilar rales.  HEART: Regular rate and rhythm.  There are no murmur, rub, or gallops.   BACK: No kyphosis or scoliosis; no CVA tenderness ABDOMEN: soft and non-tender; no masses, no organomegaly, normal abdominal  bowel sounds; no pannus; no intertriginous candida. There is no rebound and no distention. Rectal Exam: Not done EXTREMITIES: No bone or joint deformity; age-appropriate arthropathy of the hands and knees; 2+ edema; no ulcerations.  There is no calf tenderness. Genitalia: not examined PULSES: 2+ and symmetric SKIN: Normal hydration no rash or ulceration CNS: Cranial nerves 2-12 grossly intact no focal lateralizing neurologic deficit.  Speech is fluent; uvula elevated with phonation, facial symmetry and tongue midline. DTR are normal bilaterally, cerebella exam is intact, barbinski is negative and strengths are equaled bilaterally.  No sensory loss.   Labs on Admission:  Basic Metabolic Panel:  Recent Labs Lab 01/15/15 2155  NA 137  K 3.9  CL 107  CO2 26  GLUCOSE 137*  BUN 18  CREATININE 1.17  CALCIUM 8.6   Liver Function Tests:  Recent Labs Lab 01/15/15 2155  AST 21  ALT 30  ALKPHOS 93  BILITOT 0.7  PROT 7.1  ALBUMIN  3.7   No results for input(s): LIPASE, AMYLASE in the last 168 hours. No results for input(s): AMMONIA in the last 168 hours. CBC:  Recent Labs Lab 01/15/15 2155  WBC 10.4  NEUTROABS 7.1  HGB 15.7  HCT 46.5  MCV 91.0  PLT 229   Cardiac Enzymes:  Recent Labs Lab 01/15/15 2155  TROPONINI <0.03   Radiological Exams on Admission: Dg Chest Portable 1 View  01/15/2015   CLINICAL DATA:  Dyspnea and productive cough.  EXAM: PORTABLE CHEST - 1 VIEW  COMPARISON:  05/30/2013  FINDINGS: There is moderate unchanged cardiomegaly. There are intact appearances of the transvenous leads. There is marked vascular and interstitial congestive change. There is central and basilar airspace opacity, likely edema. There are small effusions bilaterally.  IMPRESSION: Congestive heart failure   Electronically Signed   By: Ellery Plunk M.D.   On: 01/15/2015 22:09    EKG: Independently reviewed. NSR with no acute ST T changes.    Assessment/Plan Present on  Admission:  . Acute on chronic systolic congestive heart failure . Chronic systolic heart failure . Coronary artery disease . Automatic implantable cardioverter-defibrillator in situ  PLAN: Will admit him to telemetry for acute and chronic systolic congestive heart failure.  Will get I/O and daily weight.  He will be placed on 2 g Na diet, along with continuing to diurese him with lasix IV  BID.  Repeat ECHO will be obtained.  I have continued his cardiac meds.  He is stable, full code, and will be admitted to [facility] service.   Other plans as per orders.  Code Status: FULL Unk Lightning, MD. [facility] Pager [phone] 7pm to 7am.  01/16/2015, 12:24 AM

## 2015-01-16 NOTE — Progress Notes (Signed)
Notification given from centralized telemetry that patient converted to A Flutter. Vitals obtained and were WNL. MD notified and 12 lead EKG ordered. MD notified of EKG results. No further interventions at this time. Will continue to monitor.

## 2015-01-16 NOTE — Plan of Care (Signed)
Problem: Phase II Progression Outcomes Goal: Walk in hall or up in chair TID Outcome: Progressing Pt sat up in chair for approximately 45 minutes. Pt reported no difficulty breathing or shortness of breath. Will continue to monitor patient.

## 2015-01-17 DIAGNOSIS — E785 Hyperlipidemia, unspecified: Secondary | ICD-10-CM

## 2015-01-17 LAB — BASIC METABOLIC PANEL
Anion gap: 5 (ref 5–15)
BUN: 21 mg/dL (ref 6–23)
CO2: 30 mmol/L (ref 19–32)
Calcium: 8.8 mg/dL (ref 8.4–10.5)
Chloride: 104 mmol/L (ref 96–112)
Creatinine, Ser: 1.13 mg/dL (ref 0.50–1.35)
GFR calc non Af Amer: 68 mL/min — ABNORMAL LOW (ref >90)
GFR, EST AFRICAN AMERICAN: 79 mL/min — AB (ref >90)
GLUCOSE: 92 mg/dL (ref 70–99)
Potassium: 3.7 mmol/L (ref 3.5–5.1)
Sodium: 139 mmol/L (ref 135–145)

## 2015-01-17 NOTE — Progress Notes (Signed)
Per telemetry, patient has converted back to sinus rhythm from A flutter and also has a paced rhythm on the monitor. All tele strips were placed in patient's chart. Will continue to monitor.

## 2015-01-17 NOTE — Progress Notes (Signed)
Patient ID: Joseph [name], male   DOB: [birth year], 59 y.o.   MRN: [id number] [facility] PROGRESS NOTE  Joseph [name] BJY:[id number] DOB: 1956-11-09 DOA: 01/15/2015 PCP: Josue Hector, MD  Brief narrative:    59 y.o. male with past medical history of chronic systolic CHF with EF 20 percent, CAD s/p CABG x 6, AIICD, history of GI bleed who presented to [facility] ED with worsening DOE, orthopnea, and bilateral pedal edema.CXR on admission showed CHF. BNP was 428. Troponin was WNL. He was started on IV lasix and admitted for further evaluation and management.   Assessment/Plan:    Principal Problem: Acute on chronic systolic congestive heart failure / Automatic implantable cardioverter-defibrillator in situ / Hx of CABG - 2 D ECHO on this admission shows EF of 25%. BNP on this admission 428. - started on IV lasix 40 mg BID with good diuresis of 13 lbs weight loss. - continue aspirin daily  - appreciate cardiology consult and recommendations - daily weight and repleted electrolytes  Active Problems: Dyslipidemia - continue Crestor  Hypertension - continue Ziac and Vasotec - continue to monitor renal function   Code Status: Full.  Family Communication:  plan of care discussed with the patient Disposition Plan: So far good diuresis with IV lasix, almost close to baseline weight of 180 lbs. Needs to be seen by cardio. Hopefully D/C in next 24 hours.  IV access:  Peripheral IV  Procedures and diagnostic studies:    Dg Chest Portable 1 View 01/15/2015 Congestive heart failure   Electronically Signed   By: Ellery Plunk M.D.   On: 01/15/2015 22:09    Medical Consultants:  Cardiology  Other Consultants:  None   IAnti-Infectives:   None    Manson Passey, MD  [facility] Pager [phone]  If 7PM-7AM, please contact night-coverage [email or web address] Password [facility] 01/17/2015, 8:24 AM   LOS: 1 day    HPI/Subjective: No acute overnight events.  Objective: Filed  Vitals:   01/16/15 0900 01/16/15 1431 01/16/15 1924 01/17/15 0540  BP:  114/72 114/69 150/74  Pulse: 65 65 58 75  Temp:  98.9 F (37.2 C) 98.2 F (36.8 C) 98.4 F (36.9 C)  TempSrc:  Oral Oral Oral  Resp:  Height:      Weight:    83.008 kg (183 lb)  SpO2:  97% 98% 100%    Intake/Output Summary (Last 24 hours) at 01/17/15 0824 Last data filed at 01/17/15 0540  Gross per 24 hour  Intake   1080 ml  Output   3850 ml  Net  -2770 ml    Exam:   General:  Pt is alert, not in acute distress  Cardiovascular: Rate controlled, irregular, Appreciate S1 and S3  Respiratory: no wheezing, no rhonchi   Abdomen: Soft, non tender, non distended, bowel sounds present  Extremities: +1-2 LE edema, pulses palpable   Neuro: Grossly nonfocal  Data Reviewed: Basic Metabolic Panel:  Recent Labs Lab 01/15/15 2155 01/17/15 0602  NA 137 139  K 3.9 3.7  CL 107 104  CO2 26 30  GLUCOSE 137* 92  BUN 18 21  CREATININE 1.17 1.13  CALCIUM 8.6 8.8   Liver Function Tests:  Recent Labs Lab 01/15/15 2155  AST 21  ALT 30  ALKPHOS 93  BILITOT 0.7  PROT 7.1  ALBUMIN 3.7   No results for input(s): LIPASE, AMYLASE in the last 168 hours. No results for input(s): AMMONIA in the last 168 hours. CBC:  Recent  Labs Lab 01/15/15 2155  WBC 10.4  NEUTROABS 7.1  HGB 15.7  HCT 46.5  MCV 91.0  PLT 229   Cardiac Enzymes:  Recent Labs Lab 01/15/15 2155  TROPONINI <0.03   BNP: Invalid input(s): POCBNP CBG: No results for input(s): GLUCAP in the last 168 hours.  Recent Results (from the past 240 hour(s))  MRSA PCR Screening     Status: None   Collection Time: 01/16/15  7:23 AM  Result Value Ref Range Status   MRSA by PCR NEGATIVE NEGATIVE Final     Scheduled Meds: . aspirin EC  81 mg Oral QHS  . bisoprolol-hydrochlorothiazide  1 tablet Oral Daily  . enalapril  10 mg Oral Daily  . furosemide  40 mg Intravenous Q12H  . heparin  5,000 Units Subcutaneous 3 times per  day  . niacin  500 mg Oral QHS  . omega-3 acid ethyl esters  1,000 mg Oral TID WC  . pantoprazole  40 mg Oral Daily  . potassium chloride  10 mEq Oral Daily  . rosuvastatin  40 mg Oral QHS   Continuous Infusions:

## 2015-01-17 NOTE — Progress Notes (Signed)
Notified by CCMD that pt had rhythm greater than 141. Strip was saved. MD notified and 12 lead EKG ordered and performed. Pt's vital signs are as follows temperature 98.7, Pulse 74, B/P148/73, Respirations 18, and O2 100% on 2L via nasal cannula. Pt was assessed and in no acute distress. Pt denies nausea, vomiting and chest pain or discomfort. MD has assessed pt. Will continue to monitor patient.

## 2015-01-17 NOTE — Progress Notes (Signed)
Informed by CCMD that patient had 6 beat run of Vtach. Assessed patient and he denies chest pain or discomfort. Vital signs within normal limits. MD notified and made aware. No further instructions at this time. Will continue to monitor patient.

## 2015-01-18 ENCOUNTER — Other Ambulatory Visit: Payer: Self-pay | Admitting: *Deleted

## 2015-01-18 DIAGNOSIS — I152 Hypertension secondary to endocrine disorders: Secondary | ICD-10-CM | POA: Insufficient documentation

## 2015-01-18 DIAGNOSIS — I10 Essential (primary) hypertension: Secondary | ICD-10-CM

## 2015-01-18 DIAGNOSIS — E1159 Type 2 diabetes mellitus with other circulatory complications: Secondary | ICD-10-CM | POA: Insufficient documentation

## 2015-01-18 LAB — BASIC METABOLIC PANEL
ANION GAP: 8 (ref 5–15)
BUN: 22 mg/dL (ref 6–23)
CO2: 30 mmol/L (ref 19–32)
CREATININE: 1.08 mg/dL (ref 0.50–1.35)
Calcium: 9.1 mg/dL (ref 8.4–10.5)
Chloride: 100 mmol/L (ref 96–112)
GFR calc Af Amer: 84 mL/min — ABNORMAL LOW (ref >90)
GFR calc non Af Amer: 72 mL/min — ABNORMAL LOW (ref >90)
Glucose, Bld: 97 mg/dL (ref 70–99)
POTASSIUM: 3.5 mmol/L (ref 3.5–5.1)
Sodium: 138 mmol/L (ref 135–145)

## 2015-01-18 MED ORDER — BISOPROLOL FUMARATE 5 MG PO TABS
10.0000 mg | ORAL_TABLET | Freq: Every day | ORAL | Status: DC
  Filled 2015-01-18 (×2): qty 2

## 2015-01-18 MED ORDER — FUROSEMIDE 20 MG PO TABS
20.0000 mg | ORAL_TABLET | Freq: Every day | ORAL | Status: DC
  Administered 2015-01-18: 20 mg via ORAL
  Filled 2015-01-18: qty 1

## 2015-01-18 MED ORDER — FUROSEMIDE 20 MG PO TABS: 20.0000 mg | ORAL_TABLET | Freq: Every day | ORAL | Status: DC

## 2015-01-18 MED ORDER — POTASSIUM CHLORIDE CRYS ER 20 MEQ PO TBCR
20.0000 meq | EXTENDED_RELEASE_TABLET | Freq: Once | ORAL | Status: AC
  Administered 2015-01-18: 20 meq via ORAL
  Filled 2015-01-18: qty 1

## 2015-01-18 MED ORDER — POTASSIUM CHLORIDE CRYS ER 10 MEQ PO TBCR: 10.0000 meq | EXTENDED_RELEASE_TABLET | Freq: Every day | ORAL | Status: DC

## 2015-01-18 MED ORDER — BISOPROLOL FUMARATE 10 MG PO TABS: 10.0000 mg | ORAL_TABLET | Freq: Every day | ORAL | Status: DC

## 2015-01-18 NOTE — Progress Notes (Signed)
Patient escorted to ED lobby via wheelchair. No distress noted.

## 2015-01-18 NOTE — Discharge Instructions (Signed)

## 2015-01-18 NOTE — Care Management Note (Signed)
    Page 1 of 1   01/18/2015     12:43:44 PM CARE MANAGEMENT NOTE 01/18/2015  Patient:  Joseph [name], Joseph [name]   Account Number:  [id number]  Date Initiated:  01/18/2015  Documentation initiated by:  Kathyrn Sheriff  Subjective/Objective Assessment:   Pt is from home, lives with wife. Pt has no HH services, DME's or med needs prior to amdission. Pt plans to return home with self care at discharge. Pt plans for discharge today. Pt has no CM needs.     Action/Plan:   Anticipated DC Date:  01/18/2015   Anticipated DC Plan:  HOME/SELF CARE      DC Planning Services  CM consult      Choice offered to / List presented to:             Status of service:  Completed, signed off Medicare Important Message given?  YES (If response is "NO", the following Medicare IM given date fields will be blank) Date Medicare IM given:  01/18/2015 Medicare IM given by:  Kathyrn Sheriff Date Additional Medicare IM given:   Additional Medicare IM given by:    Discharge Disposition:  HOME/SELF CARE  Per UR Regulation:  Reviewed for med. necessity/level of care/duration of stay  If discussed at Long Length of Stay Meetings, dates discussed:    Comments:  01/18/2015 1200 Kathyrn Sheriff, RN, MSN, CM

## 2015-01-18 NOTE — Care Management Utilization Note (Signed)
UR completed 

## 2015-01-18 NOTE — Consult Note (Signed)
CARDIOLOGY CONSULT NOTE   Patient ID: Joseph [name] MRN: [id number] DOB/AGE: 02/03/1956 59 y.o.  Admit Date: 01/15/2015 Referring Physician: PTH Primary Physician: Josue Hector, MD Consulting Cardiologist: Dina Rich MD Primary Cardiologist Rollene Rotunda MD Reason for Consultation:   Clinical Summary Mr. Spradley is a 59 y.o.male with known history of ICM, EF of 25%-30% per echo this admission, CAD with hx of CABG, AICD in situ-St. Jude , admitted with systolic CHF on 01/16/2015 and LEE. He was in his normal state of health when he began to have coughing and congestion last Tuesday-Wednesday. Saw is PCP and was diagnosed with bronchitis, and placed on a abx and decongestants. He had gained approx 20 lbs.   Two days later he continued to have worsening dyspnea and began to have LEE. He admits to eating salty foods, Chinese, fast foods, and adding salt to his foods. He states that no one told him to avoid salt. He had PND and decided to come to ER.  On arrival to ER, BP was elevated at 192/112, HR 84 bpm. O2 sat 98%. Pro-BNP 428.0 CXR consistent with CHF. EKG NSR with PAC's. He was treated with lasix 40 mg IV and NTG topically. He has diuresed 4.9 liters since admission. He is feeling better and wants to go home.    No Known Allergies  Medications Scheduled Medications: . aspirin EC  81 mg Oral QHS  . bisoprolol-hydrochlorothiazide  1 tablet Oral Daily  . enalapril  10 mg Oral Daily  . furosemide  40 mg Intravenous Q12H  . heparin  5,000 Units Subcutaneous 3 times per day  . niacin  500 mg Oral QHS  . omega-3 acid ethyl esters  1,000 mg Oral TID WC  . pantoprazole  40 mg Oral Daily  . potassium chloride  10 mEq Oral Daily  . rosuvastatin  40 mg Oral QHS    Infusions:    PRN Medications:     Past Medical History  Diagnosis Date  . Left ventricular dysfunction     apical thrombus  . CHF (congestive heart failure)     EF 20%  . GI bleed     Mallory-Weiss  tear EGD 02/2010  . Small bowel obstruction 2007    resolved without surgery  . Ileus 2006    required hospitalization  . Hyperlipidemia   . MRSA bacteremia   . Coronary artery disease   . 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 Jude    Past Surgical History  Procedure Laterality Date  . Coronary artery bypass graft      6 vessels, 2000  . Insert / replace / remove pacemaker  01/05/10    ICD insertion/St. Jude single chamber  . Basilic vein left arm resection      Family History  Problem Relation Age of Onset  . Heart attack Mother   . Emphysema Father   . Coronary artery disease Father     s/p cabg    Social History Mr. Kosak reports that he quit smoking about 26 years ago. He has never used smokeless tobacco. Mr. Vereb reports that he does not drink alcohol.  Review of Systems Complete review of systems are found to be negative unless outlined in H&P above.  Physical Examination Blood pressure 128/79, pulse 64, temperature 98.5 F (36.9 C), temperature source Oral, resp. rate 18, height 5\' 11"  (1.803 m), weight 181 lb 4.8 oz (82.237 kg), SpO2 100 %.  Intake/Output Summary (  Last 24 hours) at 01/18/15 1029 Last data filed at 01/18/15 0524  Gross per 24 hour  Intake    960 ml  Output   3175 ml  Net  -2215 ml    Telemetry: NSR with occasional PAC.   GEN: No acute distress HEENT: Conjunctiva and lids normal, oropharynx clear with moist mucosa. Neck: Supple, no elevated JVP or carotid bruits, no thyromegaly. Lungs: Clear to auscultation, nonlabored breathing at rest. Cardiac: Regular rate and rhythm, soft systolic murmur, no pericardial rub. Abdomen: Soft, nontender, no hepatomegaly, bowel sounds present, no guarding or rebound. Extremities: Mild dependent pre-tibial edema, distal pulses 2+. Skin: Warm and dry. Musculoskeletal: No kyphosis. Neuropsychiatric: Alert and oriented x3, affect grossly appropriate.  Prior  Cardiac Testing/Procedures 1.Echocardiogram 01/16/2015 Left ventricle: The cavity size was mildly dilated. Systolic function was severely reduced. The estimated ejection fraction was in the range of 25% to 30%. There is akinesis of the mid-apicalanteroseptal myocardium. There is akinesis of the basal-midinferoseptal myocardium. There is severe hypokinesis of the entireinferior myocardium. There was a reduced contribution of atrial contraction to ventricular filling, due to increased ventricular diastolic pressure or atrial contractile dysfunction. Doppler parameters are consistent with a reversible restrictive pattern, indicative of decreased left ventricular diastolic compliance and/or increased left atrial pressure (grade 3 diastolic dysfunction). - Mitral valve: There was mild regurgitation.  2.Cardiac Cath: 11/30/2009 Severe native vessel disease, patent grafts, severe global cardiomyopathy  Lab Results  Basic Metabolic Panel:  Recent Labs Lab 01/15/15 2155 01/17/15 0602 01/18/15 0526  NA 137 139 138  K 3.9 3.7 3.5  CL 107 104 100  CO2 GLUCOSE 137* 92 97  BUN CREATININE 1.17 1.13 1.08  CALCIUM 8.6 8.8 9.1    Liver Function Tests:  Recent Labs Lab 01/15/15 2155  AST 21  ALT 30  ALKPHOS 93  BILITOT 0.7  PROT 7.1  ALBUMIN 3.7    CBC:  Recent Labs Lab 01/15/15 2155  WBC 10.4  NEUTROABS 7.1  HGB 15.7  HCT 46.5  MCV 91.0  PLT 229    Cardiac Enzymes:  Recent Labs Lab 01/15/15 2155  TROPONINI <0.03     Radiology:01/15/2015 FINDINGS: There is moderate unchanged cardiomegaly. There are intact appearances of the transvenous leads. There is marked vascular and interstitial congestive change. There is central and basilar airspace opacity, likely edema. There are small effusions bilaterally.  IMPRESSION: Congestive heart failure   ECG: NSR with PAC's, LVH and T-wave flattening inferiorly.     Impression and Recommendations  1. Acute/Chronic Systolic CHF: He admits to eating salty foods, but denies medical non-compliance. He has diuresed 4.9 liters on I V lasix 40 mg BID. Review of home medications has him on bisprolol/HCTZ 10/6.25 mg daily and enalapril 20 mg daily. Dry wt is 180-185 per patient. He was 196 lbs on first wt recorded. He appears almost euvolemic with the exception of mild dependent edema, non-pitting. I have spoken at length about low sodium diet and to avoid fast food. He verbalizes understanding. Will begin lasix 20 mg daily in addition to current regimen. He is to weigh daily. Creatinine 1.08. Can consider spironolactone but will hold off for now and have him follow up with Dr. Wellfleet Lions, his primary cardiologist.  Replace potassium to keep it at 4.0 in CAD patients. Currently at 3.5. Give on 20 mEq dose in addition to 10 mEq dose he is getting on a daily basis. Follow up BMET.  2. CAD:  Hx  of CABG 2000. Troponin negative during this admission. Continue BB, ASA, ACE.Statin. Close follow up with Dr. Antoine Poche post discharge.   3. ICD in situ: Patient will continue follow up with [facility] and Dr. Ladona Ridgel as scheduled.     Signed: Bettey Mare. Lawrence NP [facility]  01/18/2015, 10:29 AM Co-Sign MD  Patient seen and discussed with NP Lyman Bishop, I agree with her documentation above. 59 yo male hx of chronic systolic HF LVEF 16-10%, hx of CAD with prior CABG, St Jude ICD admitted with SOB. Evidence of volume overload, managed for acute on chronic systolic HF over the weekend by primary team. He is negative 5 liters since admission, Cr trending down consistent with venous congestion and CHF. IV lasix stopped this AM, now on oral  daily .    Echo 12/2014 LVEF 25-30%, grade III diastolic dysfunction CXR + pulm edema Trop neg, BNP 428, K 3.9, Cr 1.17, Hgb 15.7, Plt 229, TSH 0.567 EKG NSR with LVH, anteroseptal infarct.   From history exacerbating factor appears to be  dietary sodium intake. Educated on sodium restriction. He was on bisoprolol/HCTZ combo at home, now that he is on lasix will discontinue HCTZ component and continue bisoprolol. He will need f/u and BMET in 1 week and follow up with with NP Lyman Bishop in 1-2 weeks, then may resume regular f/u with Dr Antoine Poche. Ok for discharge from cardiac standpoint.   Dominga Ferry MD

## 2015-01-18 NOTE — Discharge Summary (Signed)
Physician Discharge Summary  Joseph [name]:[id number] DOB: [birth year] DOA: 01/15/2015  PCP: Josue Hector, MD  Admit date: 01/15/2015 Discharge date: 01/18/2015  Recommendations for Outpatient Follow-up:  1. Patient will follow up with cardiology per scheduled appt 2. Pt is discharged with lasix 20 mg daily and low dose potassium supplementation  Discharge Diagnoses:  Active Problems:   Automatic implantable cardioverter-defibrillator in situ   Coronary artery disease   Chronic systolic heart failure   Hx of CABG   Acute on chronic systolic congestive heart failure   CHF (congestive heart failure)    Discharge Condition: stable   Diet recommendation: as tolerated   History of present illness:  59 y.o. male with past medical history of chronic systolic CHF with EF 20 percent, CAD s/p CABG x 6, AIICD, history of GI bleed who presented to [facility] ED with worsening DOE, orthopnea, and bilateral pedal edema.CXR on admission showed CHF. BNP was 428. Troponin was WNL. He was started on IV lasix and admitted for further evaluation and management.   Assessment/Plan:    Principal Problem: Acute on chronic systolic congestive heart failure / Automatic implantable cardioverter-defibrillator in situ / Hx of CABG - 2 D ECHO on this admission shows EF of 25%. BNP on this admission 428. - Patient was started on IV lasix 40 mg BID with good diuresis of 14 lbs weight loss since admission - per cardio he will be discharged on lasix 20 mg daily and low dose potassium supplementation - continue aspirin daily    Active Problems: Dyslipidemia - continue Crestor on discharge   Hypertension - continue Vasotec and bisoprolol, no need for Hctz since he is discharged with lasix  - continue to monitor renal function   Code Status: Full.  Family Communication: plan of care discussed with the patient and his wife at the bedside    IV access:  Peripheral IV  Procedures and  diagnostic studies:   Dg Chest Portable 1 View 01/15/2015 Congestive heart failure    Medical Consultants:  Cardiology  Other Consultants:  None   IAnti-Infectives:   None     Signed:  Manson Passey, MD  [facility] 01/18/2015, 2:58 PM  Pager #: [phone]   Discharge Exam: Filed Vitals:   01/18/15 1004  BP: 128/79  Pulse:   Temp:   Resp:    Filed Vitals:   01/17/15 1320 01/17/15 2047 01/18/15 0522 01/18/15 1004  BP: 103/67 123/67 141/74 128/79  Pulse: 63 64 64   Temp: 98.5 F (36.9 C) 98.7 F (37.1 C) 98.5 F (36.9 C)   TempSrc: Oral Oral Oral   Resp: Height:      Weight:   82.237 kg (181 lb 4.8 oz)   SpO2: 98% 95% 100%     General: Pt is alert, follows commands appropriately, not in acute distress Cardiovascular: Regular rate and rhythm, S1/S2 appreciated  Respiratory: Clear to auscultation bilaterally, no wheezing, no crackles, no rhonchi Abdominal: Soft, non tender, non distended, bowel sounds +, no guarding Extremities: +1 LE edema, no cyanosis, pulses palpable bilaterally DP and PT Neuro: Grossly nonfocal  Discharge Instructions  Discharge Instructions    Call MD for:  difficulty breathing, headache or visual disturbances    Complete by:  As directed      Call MD for:  persistant nausea and vomiting    Complete by:  As directed      Call MD for:  severe uncontrolled pain    Complete  by:  As directed      Diet - low sodium heart healthy    Complete by:  As directed      Increase activity slowly    Complete by:  As directed             Medication List    STOP taking these medications        azithromycin 250 MG tablet  Commonly known as:  ZITHROMAX     bisoprolol-hydrochlorothiazide 10-6.25 MG per tablet  Commonly known as:  ZIAC      TAKE these medications        acetaminophen 500 MG tablet  Commonly known as:  TYLENOL  Take 500 mg by mouth every 6 (six) hours as needed for pain.     aspirin 81  MG tablet  Take 81 mg by mouth at bedtime.     bisoprolol 10 MG tablet  Commonly known as:  ZEBETA  Take 1 tablet (10 mg total) by mouth daily.  Start taking on:  01/19/2015     dextromethorphan-guaiFENesin 30-600 MG per 12 hr tablet  Commonly known as:  MUCINEX DM  Take 1 tablet by mouth 2 (two) times daily as needed for cough.     enalapril 20 MG tablet  Commonly known as:  VASOTEC  Take 0.5 tablets (10 mg total) by mouth daily.     fish oil-omega-3 fatty acids 1000 MG capsule  Take 1 g by mouth 3 (three) times daily.     furosemide 20 MG tablet  Commonly known as:  LASIX  Take 1 tablet (20 mg total) by mouth daily.     niacin 500 MG CR tablet  Commonly known as:  NIASPAN  Take 500 mg by mouth at bedtime.     nitroGLYCERIN 0.4 MG SL tablet  Commonly known as:  NITROSTAT  Place 1 tablet (0.4 mg total) under the tongue every 5 (five) minutes as needed.     omeprazole 20 MG capsule  Commonly known as:  PRILOSEC  Take 20 mg by mouth daily.     potassium chloride 10 MEQ tablet  Commonly known as:  K-DUR,KLOR-CON  Take 1 tablet (10 mEq total) by mouth daily.     rosuvastatin 20 MG tablet  Commonly known as:  CRESTOR  Take 40 mg by mouth at bedtime.           Follow-up Information    Follow up with Joni Reining, NP. Schedule an appointment as soon as possible for a visit in 1 week.   Specialty:  Nurse Practitioner   Why:  Follow up appt after recent hospitalization   Contact information:   [address] [place] [address] [phone]        The results of significant diagnostics from this hospitalization (including imaging, microbiology, ancillary and laboratory) are listed below for reference.    Significant Diagnostic Studies: Dg Chest Portable 1 View  01/15/2015   CLINICAL DATA:  Dyspnea and productive cough.  EXAM: PORTABLE CHEST - 1 VIEW  COMPARISON:  05/30/2013  FINDINGS: There is moderate unchanged cardiomegaly. There are intact appearances of  the transvenous leads. There is marked vascular and interstitial congestive change. There is central and basilar airspace opacity, likely edema. There are small effusions bilaterally.  IMPRESSION: Congestive heart failure   Electronically Signed   By: Ellery Plunk M.D.   On: 01/15/2015 22:09    Microbiology: Recent Results (from the past 240 hour(s))  MRSA PCR Screening  Status: None   Collection Time: 01/16/15  7:23 AM  Result Value Ref Range Status   MRSA by PCR NEGATIVE NEGATIVE Final    Comment:        The GeneXpert MRSA Assay (FDA approved for NASAL specimens only), is one component of a comprehensive MRSA colonization surveillance program. It is not intended to diagnose MRSA infection nor to guide or monitor treatment for MRSA infections.      Labs: Basic Metabolic Panel:  Recent Labs Lab 01/15/15 2155 01/17/15 0602 01/18/15 0526  NA 137 139 138  K 3.9 3.7 3.5  CL 107 104 100  CO2 GLUCOSE 137* 92 97  BUN CREATININE 1.17 1.13 1.08  CALCIUM 8.6 8.8 9.1   Liver Function Tests:  Recent Labs Lab 01/15/15 2155  AST 21  ALT 30  ALKPHOS 93  BILITOT 0.7  PROT 7.1  ALBUMIN 3.7   No results for input(s): LIPASE, AMYLASE in the last 168 hours. No results for input(s): AMMONIA in the last 168 hours. CBC:  Recent Labs Lab 01/15/15 2155  WBC 10.4  NEUTROABS 7.1  HGB 15.7  HCT 46.5  MCV 91.0  PLT 229   Cardiac Enzymes:  Recent Labs Lab 01/15/15 2155  TROPONINI <0.03   BNP: BNP (last 3 results)  Recent Labs  01/15/15 2155  BNP 428.0*    ProBNP (last 3 results) No results for input(s): PROBNP in the last 8760 hours.  CBG: No results for input(s): GLUCAP in the last 168 hours.  Time coordinating discharge: Over 30 minutes

## 2015-01-27 LAB — BASIC METABOLIC PANEL
BUN: 15 mg/dL (ref 6–23)
CHLORIDE: 102 meq/L (ref 96–112)
CO2: 26 meq/L (ref 19–32)
CREATININE: 1.12 mg/dL (ref 0.50–1.35)
Calcium: 9.3 mg/dL (ref 8.4–10.5)
Glucose, Bld: 100 mg/dL — ABNORMAL HIGH (ref 70–99)
Potassium: 4.8 mEq/L (ref 3.5–5.3)
Sodium: 138 mEq/L (ref 135–145)

## 2015-01-27 LAB — MAGNESIUM: Magnesium: 1.9 mg/dL (ref 1.5–2.5)

## 2015-02-04 NOTE — Progress Notes (Addendum)
Cardiology Office Note   Date:  02/05/2015   ID:  [name], [name] Mar 22, 1956, MRN [id number]  PCP:  Josue Hector, MD  Cardiologist:  Hochrein/ Joni Reining, NP   Chief Complaint  Patient presents with  . Coronary Artery Disease  . Cardiomyopathy  . Congestive Heart Failure      History of Present Illness: Joseph [name] is a 59 y.o. male who presents for ongoing assessment and management of CAD, with known history of coronary bypass grafting x6, chronic systolic CHF, with EF of 20%, ICD in situ, history of GI bleed.  Recent admission to the hospital on 2/19/ 2016 in the setting of decompensated CHF.  During hospitalization 2-D echo showed an EF of 25%, he was started on IV, Lasix 40 mg twice a day and diuresed 14 pounds.  He was discharged on 20 mg of Lasix daily and low-dose potassium supplementation.  He was vised on a low sodium diet.  Weight on discharge, 181 pounds.  He comes today feeling very well. He has not gained wt, he is adhering to a low sodium diet.  He is medically compliant. No DOE, Chest pain or edema. He is weight every day.   Past Medical History  Diagnosis Date  . Left ventricular dysfunction     apical thrombus  . CHF (congestive heart failure)     EF 20%  . GI bleed     Mallory-Weiss tear EGD 02/2010  . Small bowel obstruction 2007    resolved without surgery  . Ileus 2006    required hospitalization  . Hyperlipidemia   . MRSA bacteremia   . Coronary artery disease   . 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 Jude    Past Surgical History  Procedure Laterality Date  . Coronary artery bypass graft      6 vessels, 2000  . Insert / replace / remove pacemaker  01/05/10    ICD insertion/St. Jude single chamber  . Basilic vein left arm resection       Current Outpatient Prescriptions  Medication Sig Dispense Refill  . acetaminophen (TYLENOL) 500 MG tablet Take 500 mg by mouth every 6 (six)  hours as needed for pain.    Marland Kitchen aspirin 81 MG tablet Take 81 mg by mouth at bedtime.     . bisoprolol (ZEBETA) 10 MG tablet Take 1 tablet (10 mg total) by mouth daily. 30 tablet 1  . dextromethorphan-guaiFENesin (MUCINEX DM) 30-600 MG per 12 hr tablet Take 1 tablet by mouth 2 (two) times daily as needed for cough.    . enalapril (VASOTEC) 20 MG tablet Take 0.5 tablets (10 mg total) by mouth daily. 30 tablet 2  . fish oil-omega-3 fatty acids 1000 MG capsule Take 1 g by mouth 3 (three) times daily.     . furosemide (LASIX) 20 MG tablet Take 1 tablet (20 mg total) by mouth daily. 30 tablet 1  . niacin (NIASPAN) 500 MG CR tablet Take 500 mg by mouth at bedtime.      . nitroGLYCERIN (NITROSTAT) 0.4 MG SL tablet Place 1 tablet (0.4 mg total) under the tongue every 5 (five) minutes as needed. 25 tablet 4  . omeprazole (PRILOSEC) 20 MG capsule Take 20 mg by mouth daily.     . potassium chloride (K-DUR,KLOR-CON) 10 MEQ tablet Take 1 tablet (10 mEq total) by mouth daily. 30 tablet 0  . rosuvastatin (CRESTOR) 20 MG tablet Take 40 mg by mouth at  bedtime.      No current facility-administered medications for this visit.    Allergies:   Review of patient's allergies indicates no known allergies.    Social History:  The patient  reports that he quit smoking about 26 years ago. He has never used smokeless tobacco. He reports that he does not drink alcohol or use illicit drugs.   Family History:  The patient's family history includes Coronary artery disease in his father; Emphysema in his father; Heart attack in his mother.    ROS: .   All other systems are reviewed and negative.Unless otherwise mentioned in H&P above.   PHYSICAL EXAM: VS:  There were no vitals taken for this visit. , BMI There is no weight on file to calculate BMI. GEN: Well nourished, well developed, in no acute distress HEENT: normal Neck: no JVD, carotid bruits, or masses Cardiac: RRR; distant heart sounds,  no murmurs, rubs, or  gallops,no edema  Respiratory:  clear to auscultation bilaterally, normal work of breathing GI: soft, nontender, nondistended, + BS MS: no deformity or atrophy Skin: warm and dry, no rash Neuro:  Strength and sensation are intact Psych: euthymic mood, full affect   Recent Labs: 01/15/2015: ALT 30; B Natriuretic Peptide 428.0*; Hemoglobin 15.7; Platelets 229 01/16/2015: TSH 0.567 01/26/2015: BUN 15; Creatinine 1.12; Magnesium 1.9; Potassium 4.8; Sodium 138    Lipid Panel    Component Value Date/Time   CHOL  03/08/2010 0411    81        ATP III CLASSIFICATION:  <200     mg/dL   Desirable  449-675  mg/dL   Borderline High  >=916    mg/dL   High          TRIG 50 03/08/2010 0411   HDL 25* 03/08/2010 0411   CHOLHDL 3.2 03/08/2010 0411   VLDL 10 03/08/2010 0411   LDLCALC  03/08/2010 0411    46        Total Cholesterol/HDL:CHD Risk Coronary Heart Disease Risk Table                     Men   Women  1/2 Average Risk   3.4   3.3  Average Risk       5.0   4.4  2 X Average Risk   9.6   7.1  3 X Average Risk  23.4   11.0        Use the calculated Patient Ratio above and the CHD Risk Table to determine the patient's CHD Risk.        ATP III CLASSIFICATION (LDL):  <100     mg/dL   Optimal  384-665  mg/dL   Near or Above                    Optimal  130-159  mg/dL   Borderline  993-570  mg/dL   High  >177     mg/dL   Very High      Wt Readings from Last 3 Encounters:  01/18/15 181 lb 4.8 oz (82.237 kg)  12/02/14 186 lb (84.369 kg)  04/27/14 181 lb (82.101 kg)       ASSESSMENT AND PLAN:  1. CAD: S/P CABG: Stable from cardiac standpoint. Continue current regimen with medical management with BB, ACE,Fish  Oil and statin.   2. ICM EF of 25%: He is well compensated without edema. I have reinforced low sodium diet and daily wts. He is to  take extra lasix with potassium on days when he gains 3 lbs or more in 24 hours. We will see him in 3 months.  3. Hypercholesterolemia: He is  given samples of Crestor 20 mg. He is waiting to hear if his insurance will cover this. If not, will switch to the insurance formulary.  Current medicines are reviewed at length with the patient today.     No orders of the defined types were placed in this encounter.     Disposition:   FU with 3 months.    Signed, Joni Reining, NP  02/05/2015 1:49 PM    [facility] [address] [place], [place] [address] Phone: [phone]; Fax: [phone]

## 2015-02-05 ENCOUNTER — Ambulatory Visit (INDEPENDENT_AMBULATORY_CARE_PROVIDER_SITE_OTHER): Payer: Medicare HMO | Admitting: Adult Health

## 2015-02-05 ENCOUNTER — Encounter: Payer: Self-pay | Admitting: Adult Health

## 2015-02-05 ENCOUNTER — Ambulatory Visit (INDEPENDENT_AMBULATORY_CARE_PROVIDER_SITE_OTHER): Payer: Medicare HMO | Admitting: *Deleted

## 2015-02-05 VITALS — BP 122/60 | HR 60 | Ht 71.0 in | Wt 185.0 lb

## 2015-02-05 DIAGNOSIS — I2510 Atherosclerotic heart disease of native coronary artery without angina pectoris: Secondary | ICD-10-CM | POA: Diagnosis not present

## 2015-02-05 DIAGNOSIS — E7800 Pure hypercholesterolemia, unspecified: Secondary | ICD-10-CM

## 2015-02-05 DIAGNOSIS — I255 Ischemic cardiomyopathy: Secondary | ICD-10-CM

## 2015-02-05 DIAGNOSIS — I5022 Chronic systolic (congestive) heart failure: Secondary | ICD-10-CM

## 2015-02-05 LAB — MDC_IDC_ENUM_SESS_TYPE_INCLINIC
Battery Remaining Longevity: 70.8 mo
Date Time Interrogation Session: 20160311131237
HIGH POWER IMPEDANCE MEASURED VALUE: 48.6519
HighPow Impedance: 49 Ohm
Lead Channel Impedance Value: 487.5 Ohm
Lead Channel Pacing Threshold Amplitude: 0.75 V
Lead Channel Pacing Threshold Amplitude: 0.75 V
Lead Channel Pacing Threshold Pulse Width: 0.4 ms
Lead Channel Sensing Intrinsic Amplitude: 12 mV
Lead Channel Setting Pacing Pulse Width: 0.4 ms
Lead Channel Setting Sensing Sensitivity: 0.5 mV
MDC IDC MSMT LEADCHNL RV PACING THRESHOLD PULSEWIDTH: 0.4 ms
MDC IDC PG SERIAL: 747960
MDC IDC SET LEADCHNL RV PACING AMPLITUDE: 2.5 V
MDC IDC SET ZONE DETECTION INTERVAL: 270 ms
MDC IDC STAT BRADY RV PERCENT PACED: 0.05 %

## 2015-02-05 MED ORDER — POTASSIUM CHLORIDE CRYS ER 10 MEQ PO TBCR: 10.0000 meq | EXTENDED_RELEASE_TABLET | Freq: Every day | ORAL | Status: DC

## 2015-02-05 MED ORDER — FUROSEMIDE 20 MG PO TABS: 20.0000 mg | ORAL_TABLET | Freq: Every day | ORAL | Status: DC

## 2015-02-05 MED ORDER — BISOPROLOL FUMARATE 10 MG PO TABS: 10.0000 mg | ORAL_TABLET | Freq: Every day | ORAL | Status: DC

## 2015-02-05 MED ORDER — ENALAPRIL MALEATE 20 MG PO TABS: 10.0000 mg | ORAL_TABLET | Freq: Every day | ORAL | Status: DC

## 2015-02-05 NOTE — Progress Notes (Signed)
Name: Joseph [name]    DOB: 1956/08/12  Age: 59 y.o.  MR#: [id number]       PCP:  Josue Hector, MD      Insurance: Payor: Monia Pouch MEDICARE / Plan: [employer] MEDICARE HMO/PPO / Product Type: *No Product type* /   CC:    Chief Complaint  Patient presents with  . Coronary Artery Disease  . Cardiomyopathy  . Congestive Heart Failure    VS Filed Vitals:   02/05/15 1354  BP: 122/60  Pulse: 60  Height: 5\' 11"  (1.803 m)  Weight: 185 lb (83.915 kg)  SpO2: 99%    Weights Current Weight  02/05/15 185 lb (83.915 kg)  01/18/15 181 lb 4.8 oz (82.237 kg)  12/02/14 186 lb (84.369 kg)    Blood Pressure  BP Readings from Last 3 Encounters:  02/05/15 122/60  01/18/15 128/79  12/02/14 138/88     Admit date:  (Not on file) Last encounter with RMR:  Visit date not found   Allergy Review of patient's allergies indicates no known allergies.  Current Outpatient Prescriptions  Medication Sig Dispense Refill  . acetaminophen (TYLENOL) 500 MG tablet Take 500 mg by mouth every 6 (six) hours as needed for pain.    Marland Kitchen aspirin 81 MG tablet Take 81 mg by mouth at bedtime.     . bisoprolol (ZEBETA) 10 MG tablet Take 1 tablet (10 mg total) by mouth daily. 30 tablet 1  . dextromethorphan-guaiFENesin (MUCINEX DM) 30-600 MG per 12 hr tablet Take 1 tablet by mouth 2 (two) times daily as needed for cough.    . enalapril (VASOTEC) 20 MG tablet Take 0.5 tablets (10 mg total) by mouth daily. 30 tablet 2  . fish oil-omega-3 fatty acids 1000 MG capsule Take 1 g by mouth 3 (three) times daily.     . furosemide (LASIX) 20 MG tablet Take 1 tablet (20 mg total) by mouth daily. 30 tablet 1  . niacin (NIASPAN) 500 MG CR tablet Take 500 mg by mouth at bedtime.      . nitroGLYCERIN (NITROSTAT) 0.4 MG SL tablet Place 1 tablet (0.4 mg total) under the tongue every 5 (five) minutes as needed. 25 tablet 4  . omeprazole (PRILOSEC) 20 MG capsule Take 20 mg by mouth daily.     . potassium chloride (K-DUR,KLOR-CON) 10 MEQ  tablet Take 1 tablet (10 mEq total) by mouth daily. 30 tablet 0  . rosuvastatin (CRESTOR) 20 MG tablet Take 40 mg by mouth at bedtime.      No current facility-administered medications for this visit.    Discontinued Meds:   There are no discontinued medications.  Patient Active Problem List   Diagnosis Date Noted  . Essential hypertension   . Acute on chronic systolic congestive heart failure 01/16/2015  . CHF (congestive heart failure) 01/16/2015  . Arteriosclerosis of coronary artery 08/05/2014  . Essential (primary) hypertension 08/05/2014  . Acid reflux 08/05/2014  . Hx of CABG 09/06/2011  . Chronic systolic heart failure 05/02/2011  . LAD stenosis   . Coronary artery disease   . BACTEREMIA 03/30/2010  . Automatic implantable cardioverter-defibrillator in situ 01/12/2010  . Other specified forms of chronic ischemic heart disease 11/03/2009  . CARDIOMYOPATHY 11/03/2009  . HYPERLIPIDEMIA 02/02/2009    LABS    Component Value Date/Time   NA 138 01/26/2015 0948   NA 138 01/18/2015 0526   NA 139 01/17/2015 0602   K 4.8 01/26/2015 0948   K 3.5 01/18/2015 0526   K  3.7 01/17/2015 0602   CL 102 01/26/2015 0948   CL 100 01/18/2015 0526   CL 104 01/17/2015 0602   CO2 26 01/26/2015 0948   CO2 30 01/18/2015 0526   CO2 30 01/17/2015 0602   GLUCOSE 100* 01/26/2015 0948   GLUCOSE 97 01/18/2015 0526   GLUCOSE 92 01/17/2015 0602   BUN 15 01/26/2015 0948   BUN 22 01/18/2015 0526   BUN 21 01/17/2015 0602   CREATININE 1.12 01/26/2015 0948   CREATININE 1.08 01/18/2015 0526   CREATININE 1.13 01/17/2015 0602   CREATININE 1.17 01/15/2015 2155   CALCIUM 9.3 01/26/2015 0948   CALCIUM 9.1 01/18/2015 0526   CALCIUM 8.8 01/17/2015 0602   GFRNONAA 72* 01/18/2015 0526   GFRNONAA 68* 01/17/2015 0602   GFRNONAA 66* 01/15/2015 2155   GFRAA 84* 01/18/2015 0526   GFRAA 79* 01/17/2015 0602   GFRAA 76* 01/15/2015 2155   CMP     Component Value Date/Time   NA 138 01/26/2015 0948   K  4.8 01/26/2015 0948   CL 102 01/26/2015 0948   CO2 26 01/26/2015 0948   GLUCOSE 100* 01/26/2015 0948   BUN 15 01/26/2015 0948   CREATININE 1.12 01/26/2015 0948   CREATININE 1.08 01/18/2015 0526   CALCIUM 9.3 01/26/2015 0948   PROT 7.1 01/15/2015 2155   ALBUMIN 3.7 01/15/2015 2155   AST 21 01/15/2015 2155   ALT 30 01/15/2015 2155   ALKPHOS 93 01/15/2015 2155   BILITOT 0.7 01/15/2015 2155   GFRNONAA 72* 01/18/2015 0526   GFRAA 84* 01/18/2015 0526       Component Value Date/Time   WBC 10.4 01/15/2015 2155   WBC 14.5* 05/30/2013 1646   WBC 6.3 03/21/2010 0400   HGB 15.7 01/15/2015 2155   HGB 15.2 05/30/2013 1646   HGB 8.4* 03/21/2010 0400   HCT 46.5 01/15/2015 2155   HCT 44.1 05/30/2013 1646   HCT 24.8* 03/21/2010 0400   MCV 91.0 01/15/2015 2155   MCV 88.2 05/30/2013 1646   MCV 90.6 03/21/2010 0400    Lipid Panel     Component Value Date/Time   CHOL  03/08/2010 0411    81        ATP III CLASSIFICATION:  <200     mg/dL   Desirable  161-096  mg/dL   Borderline High  >=045    mg/dL   High          TRIG 50 03/08/2010 0411   HDL 25* 03/08/2010 0411   CHOLHDL 3.2 03/08/2010 0411   VLDL 10 03/08/2010 0411   LDLCALC  03/08/2010 0411    46        Total Cholesterol/HDL:CHD Risk Coronary Heart Disease Risk Table                     Men   Women  1/2 Average Risk   3.4   3.3  Average Risk       5.0   4.4  2 X Average Risk   9.6   7.1  3 X Average Risk  23.4   11.0        Use the calculated Patient Ratio above and the CHD Risk Table to determine the patient's CHD Risk.        ATP III CLASSIFICATION (LDL):  <100     mg/dL   Optimal  409-811  mg/dL   Near or Above  Optimal  130-159  mg/dL   Borderline  409-811  mg/dL   High  >914     mg/dL   Very High    ABG    Component Value Date/Time   PHART 7.420 12/10/2009 0921   PCO2ART 38.5 12/10/2009 0921   PO2ART 83.0 12/10/2009 0921   HCO3 25.0* 12/10/2009 0921   TCO2 26 12/10/2009 0921    ACIDBASEDEF 1.0 12/10/2009 0915   O2SAT 96.0 12/10/2009 0921     Lab Results  Component Value Date   TSH 0.567 01/16/2015   BNP (last 3 results)  Recent Labs  01/15/15 2155  BNP 428.0*    ProBNP (last 3 results) No results for input(s): PROBNP in the last 8760 hours.  Cardiac Panel (last 3 results) No results for input(s): CKTOTAL, CKMB, TROPONINI, RELINDX in the last 72 hours.  Iron/TIBC/Ferritin/ %Sat No results found for: IRON, TIBC, FERRITIN, IRONPCTSAT   EKG Orders placed or performed during the hospital encounter of 01/15/15  . ED EKG  . ED EKG  . EKG 12-Lead  . EKG 12-Lead  . EKG 12-Lead  . EKG 12-Lead  . EKG 12-Lead  . EKG 12-Lead  . EKG 12-Lead  . EKG 12-Lead  . EKG     Prior Assessment and Plan Problem List as of 02/05/2015      Cardiovascular and Mediastinum   Other specified forms of chronic ischemic heart disease   CARDIOMYOPATHY   Last Assessment & Plan 09/06/2011 Office Visit Written 09/06/2011 12:20 PM by Rollene Rotunda, MD    He seems to be euvolemic.  At this point, no change in therapy is indicated.  We have reviewed salt and fluid restrictions.  No further cardiovascular testing is indicated.        LAD stenosis   Coronary artery disease   Last Assessment & Plan 05/28/2013 Office Visit Written 05/28/2013 11:53 AM by Marinus Maw, MD    He denies anginal symptoms. He'll continue his current medical therapy.      Chronic systolic heart failure   Last Assessment & Plan 04/27/2014 Office Visit Written 04/27/2014  8:32 AM by Marinus Maw, MD    His symptoms remain class 2A. He has had some elevation in his fluid monitor which has resolved. He is encouraged to maintain a low sodium diet. He will continue his current meds.      Arteriosclerosis of coronary artery   Essential (primary) hypertension   Acute on chronic systolic congestive heart failure   CHF (congestive heart failure)   Essential hypertension     Digestive   Acid reflux      Other   HYPERLIPIDEMIA   Last Assessment & Plan 03/20/2012 Office Visit Written 03/20/2012 12:09 PM by Rollene Rotunda, MD    His last LDL was 91 and HDL 45. He's going to try to improve his diet and have this repeated by his primary provider.      BACTEREMIA   Automatic implantable cardioverter-defibrillator in situ   Last Assessment & Plan 04/27/2014 Office Visit Written 04/27/2014  8:31 AM by Marinus Maw, MD    His St. Jude device is working normally. Will recheck in several months.      Hx of CABG   Last Assessment & Plan 04/27/2014 Office Visit Written 04/27/2014  8:33 AM by Marinus Maw, MD    He denies anginal symptoms and remains active. Will follow.          Imaging: Dg Chest Portable 1 View  01/15/2015   CLINICAL DATA:  Dyspnea and productive cough.  EXAM: PORTABLE CHEST - 1 VIEW  COMPARISON:  05/30/2013  FINDINGS: There is moderate unchanged cardiomegaly. There are intact appearances of the transvenous leads. There is marked vascular and interstitial congestive change. There is central and basilar airspace opacity, likely edema. There are small effusions bilaterally.  IMPRESSION: Congestive heart failure   Electronically Signed   By: Ellery Plunk M.D.   On: 01/15/2015 22:09

## 2015-02-05 NOTE — Patient Instructions (Signed)
Your physician recommends that you schedule a follow-up appointment in: 3 months with Joni Reining, NP  Your physician recommends that you continue on your current medications as directed. Please refer to the Current Medication list given to you today.  You have been given samples of Crestor today  Thank you for choosing [facility]!

## 2015-02-05 NOTE — Progress Notes (Signed)
ICD check with [employer] in office. 

## 2015-02-16 ENCOUNTER — Encounter: Payer: Self-pay | Admitting: Internal Medicine

## 2015-03-08 ENCOUNTER — Other Ambulatory Visit: Payer: Self-pay | Admitting: Adult Health

## 2015-03-08 MED ORDER — FUROSEMIDE 20 MG PO TABS: 20.0000 mg | ORAL_TABLET | Freq: Every day | ORAL | Status: DC

## 2015-03-08 MED ORDER — BISOPROLOL FUMARATE 10 MG PO TABS: 10.0000 mg | ORAL_TABLET | Freq: Every day | ORAL | Status: DC

## 2015-03-08 NOTE — Telephone Encounter (Signed)
Needs refills on Furosemide and Bisoprolol sent to [facility] / tg

## 2015-03-08 NOTE — Telephone Encounter (Signed)
Refills oct

## 2015-04-16 ENCOUNTER — Encounter: Payer: Self-pay | Admitting: Internal Medicine

## 2015-04-16 ENCOUNTER — Ambulatory Visit (INDEPENDENT_AMBULATORY_CARE_PROVIDER_SITE_OTHER): Payer: Medicare HMO | Admitting: Internal Medicine

## 2015-04-16 VITALS — BP 122/68 | HR 65 | Ht 71.0 in | Wt 185.0 lb

## 2015-04-16 DIAGNOSIS — I5023 Acute on chronic systolic (congestive) heart failure: Secondary | ICD-10-CM

## 2015-04-16 DIAGNOSIS — Z95810 Presence of automatic (implantable) cardiac defibrillator: Secondary | ICD-10-CM | POA: Diagnosis not present

## 2015-04-16 DIAGNOSIS — I5022 Chronic systolic (congestive) heart failure: Secondary | ICD-10-CM

## 2015-04-16 DIAGNOSIS — I255 Ischemic cardiomyopathy: Secondary | ICD-10-CM | POA: Diagnosis not present

## 2015-04-16 DIAGNOSIS — I10 Essential (primary) hypertension: Secondary | ICD-10-CM

## 2015-04-16 LAB — CUP PACEART INCLINIC DEVICE CHECK
Brady Statistic RV Percent Paced: 0.13 %
Date Time Interrogation Session: 20160520091356
HighPow Impedance: 51.9946
Lead Channel Impedance Value: 525 Ohm
Lead Channel Pacing Threshold Amplitude: 0.75 V
Lead Channel Pacing Threshold Pulse Width: 0.4 ms
Lead Channel Sensing Intrinsic Amplitude: 12 mV
MDC IDC MSMT BATTERY REMAINING LONGEVITY: 70.8 mo
MDC IDC SET LEADCHNL RV PACING AMPLITUDE: 2.5 V
MDC IDC SET LEADCHNL RV PACING PULSEWIDTH: 0.4 ms
MDC IDC SET LEADCHNL RV SENSING SENSITIVITY: 0.5 mV
MDC IDC SET ZONE DETECTION INTERVAL: 270 ms
Pulse Gen Serial Number: 747960

## 2015-04-16 NOTE — Patient Instructions (Signed)
Your physician wants you to follow-up in: 1 year with Dr. Ladona Ridgel. You will receive a reminder letter in the mail two months in advance. If you don't receive a letter, please call our office to schedule the follow-up appointment.  Your physician recommends that you continue on your current medications as directed. Please refer to the Current Medication list given to you today.  Remote monitoring is used to monitor your Pacemaker of ICD from home. This monitoring reduces the number of office visits required to check your device to one time per year. It allows [place] to keep an eye on the functioning of your device to ensure it is working properly. You are scheduled for a device check from home on 07/19/15. You may send your transmission at any time that day. If you have a wireless device, the transmission will be sent automatically. After your physician reviews your transmission, you will receive a postcard with your next transmission date.  Thank you for choosing [facility]!

## 2015-04-16 NOTE — Assessment & Plan Note (Signed)
His St. Jude ICD is working normally. Will recheck in several months.  

## 2015-04-16 NOTE — Progress Notes (Signed)
HPI [name] returns today for followup. He is a very pleasant 59 year old man with an ischemic cardiomyopathy, chronic systolic heart failure, ejection fraction 25%, status post ICD implantation. In the interim, he has done well. He denies chest pain, shortness of breath, fevers, chills, or syncope. No peripheral edema. He had one episode of heart failure for which he was treated in the ED and since then has done well. He admits to dietary indiscretion with sodium.  No Known Allergies   Current Outpatient Prescriptions  Medication Sig Dispense Refill  . acetaminophen (TYLENOL) 500 MG tablet Take 500 mg by mouth every 6 (six) hours as needed for pain.    Marland Kitchen aspirin 81 MG tablet Take 81 mg by mouth at bedtime.     . bisoprolol (ZEBETA) 10 MG tablet Take 1 tablet (10 mg total) by mouth daily. 90 tablet 3  . dextromethorphan-guaiFENesin (MUCINEX DM) 30-600 MG per 12 hr tablet Take 1 tablet by mouth 2 (two) times daily as needed for cough.    . enalapril (VASOTEC) 20 MG tablet Take 0.5 tablets (10 mg total) by mouth daily. 45 tablet 3  . fish oil-omega-3 fatty acids 1000 MG capsule Take 1 g by mouth 3 (three) times daily.     . furosemide (LASIX) 20 MG tablet Take 1 tablet (20 mg total) by mouth daily. 90 tablet 3  . niacin (NIASPAN) 500 MG CR tablet Take 500 mg by mouth at bedtime.      . nitroGLYCERIN (NITROSTAT) 0.4 MG SL tablet Place 1 tablet (0.4 mg total) under the tongue every 5 (five) minutes as needed. 25 tablet 4  . omeprazole (PRILOSEC) 20 MG capsule Take 20 mg by mouth daily.     . potassium chloride (K-DUR,KLOR-CON) 10 MEQ tablet Take 1 tablet (10 mEq total) by mouth daily. 90 tablet 3  . rosuvastatin (CRESTOR) 20 MG tablet Take 40 mg by mouth at bedtime.      No current facility-administered medications for this visit.     Past Medical History  Diagnosis Date  . Left ventricular dysfunction     apical thrombus  . CHF (congestive heart failure)     EF 20%  . GI bleed      Mallory-Weiss tear EGD 02/2010  . Small bowel obstruction 2007    resolved without surgery  . Ileus 2006    required hospitalization  . Hyperlipidemia   . MRSA bacteremia   . Coronary artery disease   . 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 Jude    ROS:   All systems reviewed and negative except as noted in the HPI.   Past Surgical History  Procedure Laterality Date  . Coronary artery bypass graft      6 vessels, 2000  . Insert / replace / remove pacemaker  01/05/10    ICD insertion/St. Jude single chamber  . Basilic vein left arm resection       Family History  Problem Relation Age of Onset  . Heart attack Mother   . Emphysema Father   . Coronary artery disease Father     s/p cabg     History   Social History  . Marital Status: Married    Spouse Name: N/A  . Number of Children: 3  . 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 Smoking status: Former Smoker  Start date: 11/28/1967    Quit date: 02/29/1988  . Smokeless tobacco: Never Used  . Alcohol Use: No  . Drug Use: No  . Sexual Activity: No   Other Topics Concern  . Not on file   Social History Narrative   1 son died age 80-Lee's syndrome           BP 122/68 mmHg  Pulse 65  Ht  (1.803 m)  Wt 185 lb (83.915 kg)  BMI 25.81 kg/m2  SpO2 97%  Physical Exam:  Well appearing middle-aged man,NAD HEENT: Unremarkable Neck:  7 cm JVD, no thyromegally Back:  No CVA tenderness Lungs:  Clear with no wheezes, rales, or rhonchi. HEART:  Regular rate rhythm, no murmurs, no rubs, no clicks Abd:  soft, positive bowel sounds, no organomegally, no rebound, no guarding Ext:  2 plus pulses, no edema, no cyanosis, no clubbing Skin:  No rashes no nodules Neuro:  CN II through XII intact, motor grossly intact  DEVICE  Normal device function.  See PaceArt for details.    Assess/Plan:

## 2015-04-16 NOTE — Assessment & Plan Note (Signed)
His blood pressure today is well controlled. He will continue his current meds.

## 2015-04-16 NOTE — Assessment & Plan Note (Signed)
His chronic systolic heart failure is currently class 2. He will continue his current meds and is instructed to reduce his sodium intake.

## 2015-05-06 ENCOUNTER — Encounter: Payer: Self-pay | Admitting: Adult Health

## 2015-05-06 ENCOUNTER — Ambulatory Visit (INDEPENDENT_AMBULATORY_CARE_PROVIDER_SITE_OTHER): Payer: Medicare HMO | Admitting: Adult Health

## 2015-05-06 VITALS — BP 108/76 | HR 71 | Ht 71.0 in | Wt 184.0 lb

## 2015-05-06 DIAGNOSIS — I255 Ischemic cardiomyopathy: Secondary | ICD-10-CM

## 2015-05-06 NOTE — Progress Notes (Signed)
Cardiology Office Note   Date:  05/06/2015   ID:  Joseph [name] 10/21/1956, MRN [id number]  PCP:  Josue Hector, MD  Cardiologist:  Hochrein/ Joni Reining, NP   Chief Complaint  Patient presents with  . Cardiomyopathy    Ischemic   . Coronary Artery Disease    CABG      History of Present Illness: Joseph [name] is a 59 y.o. male who presents for ongoing assessment and management of CAD, with hx of CABG and ICM with EF of 20%. ICD pacemaker in situ.Was last in in the office by Dr. Pearletha Alfred. His St. Jude pacemaker was functioning appropriately. CHF is class 2.. He was reinforced on low sodium diet.   He comes today without complaints. He is still struggling with salty foods a home at times. He takes an extra lasix and potassium if he needs to. He is compliant with all of his medications.    Past Medical History  Diagnosis Date  . Left ventricular dysfunction     apical thrombus  . CHF (congestive heart failure)     EF 20%  . GI bleed     Mallory-Weiss tear EGD 02/2010  . Small bowel obstruction 2007    resolved without surgery  . Ileus 2006    required hospitalization  . Hyperlipidemia   . MRSA bacteremia   . Coronary artery disease   . 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 Jude    Past Surgical History  Procedure Laterality Date  . Coronary artery bypass graft      6 vessels, 2000  . Insert / replace / remove pacemaker  01/05/10    ICD insertion/St. Jude single chamber  . Basilic vein left arm resection       Current Outpatient Prescriptions  Medication Sig Dispense Refill  . acetaminophen (TYLENOL) 500 MG tablet Take 500 mg by mouth every 6 (six) hours as needed for pain.    Marland Kitchen aspirin 81 MG tablet Take 81 mg by mouth at bedtime.     . bisoprolol (ZEBETA) 10 MG tablet Take 1 tablet (10 mg total) by mouth daily. 90 tablet 3  . dextromethorphan-guaiFENesin (MUCINEX DM) 30-600 MG per 12 hr tablet Take 1  tablet by mouth 2 (two) times daily as needed for cough.    . enalapril (VASOTEC) 20 MG tablet Take 0.5 tablets (10 mg total) by mouth daily. 45 tablet 3  . fish oil-omega-3 fatty acids 1000 MG capsule Take 1 g by mouth 3 (three) times daily.     . furosemide (LASIX) 20 MG tablet Take 1 tablet (20 mg total) by mouth daily. 90 tablet 3  . niacin (NIASPAN) 500 MG CR tablet Take 500 mg by mouth at bedtime.      . nitroGLYCERIN (NITROSTAT) 0.4 MG SL tablet Place 1 tablet (0.4 mg total) under the tongue every 5 (five) minutes as needed. 25 tablet 4  . omeprazole (PRILOSEC) 20 MG capsule Take 20 mg by mouth daily.     . potassium chloride (K-DUR,KLOR-CON) 10 MEQ tablet Take 1 tablet (10 mEq total) by mouth daily. 90 tablet 3  . rosuvastatin (CRESTOR) 20 MG tablet Take 40 mg by mouth at bedtime.      No current facility-administered medications for this visit.    Allergies:   Review of patient's allergies indicates no known allergies.    Social History:  The patient  reports that he quit smoking about 27 years ago.  He started smoking about 47 years ago. He has never used smokeless tobacco. He reports that he does not drink alcohol or use illicit drugs.   Family History:  The patient's family history includes Coronary artery disease in his father; Emphysema in his father; Heart attack in his mother.    ROS: .   All other systems are reviewed and negative.Unless otherwise mentioned in H&P above.   PHYSICAL EXAM: VS:  There were no vitals taken for this visit. , BMI There is no weight on file to calculate BMI. GEN: Well nourished, well developed, in no acute distress HEENT: normal Neck: no JVD, carotid bruits, or masses Cardiac: RRR; no murmurs, rubs, or gallops,no edema  Respiratory: Mild crackles in the bases, cleared with cough. GI: soft, nontender, nondistended, + BS MS: no deformity or atrophy Skin: warm and dry, no rash Neuro:  Strength and sensation are intact Psych: euthymic mood,  full affect   Recent Labs: 01/15/2015: ALT 30; B Natriuretic Peptide 428.0*; Hemoglobin 15.7; Platelets 229 01/16/2015: TSH 0.567 01/26/2015: BUN 15; Creat 1.12; Magnesium 1.9; Potassium 4.8; Sodium 138    Lipid Panel    Component Value Date/Time   CHOL  03/08/2010 0411    81        ATP III CLASSIFICATION:  <200     mg/dL   Desirable  098-119  mg/dL   Borderline High  >=147    mg/dL   High          TRIG 50 03/08/2010 0411   HDL 25* 03/08/2010 0411   CHOLHDL 3.2 03/08/2010 0411   VLDL 10 03/08/2010 0411   LDLCALC  03/08/2010 0411    46        Total Cholesterol/HDL:CHD Risk Coronary Heart Disease Risk Table                     Men   Women  1/2 Average Risk   3.4   3.3  Average Risk       5.0   4.4  2 X Average Risk   9.6   7.1  3 X Average Risk  23.4   11.0        Use the calculated Patient Ratio above and the CHD Risk Table to determine the patient's CHD Risk.        ATP III CLASSIFICATION (LDL):  <100     mg/dL   Optimal  829-562  mg/dL   Near or Above                    Optimal  130-159  mg/dL   Borderline  130-865  mg/dL   High  >784     mg/dL   Very High      Wt Readings from Last 3 Encounters:  04/16/15 185 lb (83.915 kg)  02/05/15 185 lb (83.915 kg)  01/18/15 181 lb 4.8 oz (82.237 kg)      Other studies Reviewed: Additional studies/ records that were reviewed today include: Ladona Ridgel, PPM check in May.   Review of the above records demonstrates: PPM is working appropriately.   ASSESSMENT AND PLAN:  1.  ICM: EF 20%. He is still struggling with his salt intake. He is weighing daily and is watching his salt as best he can. He appears to euvolemic at this time. Continue current medication regimen.   2. Hypotension: BP is good for is current EF. Continue ACE and BB.   3. CAD: He is without complaints  of chest pain or DOE. I have advised him that he is not to lift heavy objects or be out in the heat >85 degrees for prolonged periods of time. He will continue  ASA,statin and BB. Will see him in 6 months unless symptomatic.   Current medicines are reviewed at length with the patient today.    Labs/ tests ordered today include: None No orders of the defined types were placed in this encounter.     Disposition:   FU with  6 months   Signed, Joni Reining, NP  05/06/2015 6:59 AM    [facility] [address] [place], [place] [address] Phone: [phone]; Fax: [phone]

## 2015-05-06 NOTE — Progress Notes (Deleted)
Name: Joseph [name]    DOB: [birth year]  Age: 59 y.o.  MR#: [id number]       PCP:  Josue Hector, MD      Insurance: Payor: Monia Pouch MEDICARE / Plan: [employer] MEDICARE HMO/PPO / Product Type: *No Product type* /   CC:    Chief Complaint  Patient presents with  . Cardiomyopathy    Ischemic   . Coronary Artery Disease    CABG    VS Filed Vitals:   05/06/15 1257  BP: 108/76  Pulse: 71  Height: 5\' 11"  (1.803 m)  Weight: 184 lb (83.462 kg)  SpO2: 99%    Weights Current Weight  05/06/15 184 lb (83.462 kg)  04/16/15 185 lb (83.915 kg)  02/05/15 185 lb (83.915 kg)    Blood Pressure  BP Readings from Last 3 Encounters:  05/06/15 108/76  04/16/15 122/68  02/05/15 122/60     Admit date:  (Not on file) Last encounter with RMR:  03/08/2015   Allergy Review of patient's allergies indicates no known allergies.  Current Outpatient Prescriptions  Medication Sig Dispense Refill  . acetaminophen (TYLENOL) 500 MG tablet Take 500 mg by mouth every 6 (six) hours as needed for pain.    Marland Kitchen aspirin 81 MG tablet Take 81 mg by mouth at bedtime.     . bisoprolol (ZEBETA) 10 MG tablet Take 1 tablet (10 mg total) by mouth daily. 90 tablet 3  . dextromethorphan-guaiFENesin (MUCINEX DM) 30-600 MG per 12 hr tablet Take 1 tablet by mouth 2 (two) times daily as needed for cough.    . enalapril (VASOTEC) 20 MG tablet Take 0.5 tablets (10 mg total) by mouth daily. 45 tablet 3  . fish oil-omega-3 fatty acids 1000 MG capsule Take 1 g by mouth 3 (three) times daily.     . furosemide (LASIX) 20 MG tablet Take 1 tablet (20 mg total) by mouth daily. 90 tablet 3  . niacin (NIASPAN) 500 MG CR tablet Take 500 mg by mouth at bedtime.      . nitroGLYCERIN (NITROSTAT) 0.4 MG SL tablet Place 1 tablet (0.4 mg total) under the tongue every 5 (five) minutes as needed. 25 tablet 4  . omeprazole (PRILOSEC) 20 MG capsule Take 20 mg by mouth daily.     . potassium chloride (K-DUR,KLOR-CON) 10 MEQ tablet Take 1 tablet (10  mEq total) by mouth daily. 90 tablet 3  . rosuvastatin (CRESTOR) 20 MG tablet Take 40 mg by mouth at bedtime.      No current facility-administered medications for this visit.    Discontinued Meds:   There are no discontinued medications.  Patient Active Problem List   Diagnosis Date Noted  . Essential hypertension   . Acute on chronic systolic congestive heart failure 01/16/2015  . CHF (congestive heart failure) 01/16/2015  . Arteriosclerosis of coronary artery 08/05/2014  . Essential (primary) hypertension 08/05/2014  . Acid reflux 08/05/2014  . Hx of CABG 09/06/2011  . Chronic systolic heart failure 05/02/2011  . LAD stenosis   . Coronary artery disease   . BACTEREMIA 03/30/2010  . Automatic implantable cardioverter-defibrillator in situ 01/12/2010  . Other specified forms of chronic ischemic heart disease 11/03/2009  . CARDIOMYOPATHY 11/03/2009  . HYPERLIPIDEMIA 02/02/2009    LABS    Component Value Date/Time   NA 138 01/26/2015 0948   NA 138 01/18/2015 0526   NA 139 01/17/2015 0602   K 4.8 01/26/2015 0948   K 3.5 01/18/2015 0526   K 3.7  01/17/2015 0602   CL 102 01/26/2015 0948   CL 100 01/18/2015 0526   CL 104 01/17/2015 0602   CO2 26 01/26/2015 0948   CO2 30 01/18/2015 0526   CO2 30 01/17/2015 0602   GLUCOSE 100* 01/26/2015 0948   GLUCOSE 97 01/18/2015 0526   GLUCOSE 92 01/17/2015 0602   BUN 15 01/26/2015 0948   BUN 22 01/18/2015 0526   BUN 21 01/17/2015 0602   CREATININE 1.12 01/26/2015 0948   CREATININE 1.08 01/18/2015 0526   CREATININE 1.13 01/17/2015 0602   CREATININE 1.17 01/15/2015 2155   CALCIUM 9.3 01/26/2015 0948   CALCIUM 9.1 01/18/2015 0526   CALCIUM 8.8 01/17/2015 0602   GFRNONAA 72* 01/18/2015 0526   GFRNONAA 68* 01/17/2015 0602   GFRNONAA 66* 01/15/2015 2155   GFRAA 84* 01/18/2015 0526   GFRAA 79* 01/17/2015 0602   GFRAA 76* 01/15/2015 2155   CMP     Component Value Date/Time   NA 138 01/26/2015 0948   K 4.8 01/26/2015 0948   CL  102 01/26/2015 0948   CO2 26 01/26/2015 0948   GLUCOSE 100* 01/26/2015 0948   BUN 15 01/26/2015 0948   CREATININE 1.12 01/26/2015 0948   CREATININE 1.08 01/18/2015 0526   CALCIUM 9.3 01/26/2015 0948   PROT 7.1 01/15/2015 2155   ALBUMIN 3.7 01/15/2015 2155   AST 21 01/15/2015 2155   ALT 30 01/15/2015 2155   ALKPHOS 93 01/15/2015 2155   BILITOT 0.7 01/15/2015 2155   GFRNONAA 72* 01/18/2015 0526   GFRAA 84* 01/18/2015 0526       Component Value Date/Time   WBC 10.4 01/15/2015 2155   WBC 14.5* 05/30/2013 1646   WBC 6.3 03/21/2010 0400   HGB 15.7 01/15/2015 2155   HGB 15.2 05/30/2013 1646   HGB 8.4* 03/21/2010 0400   HCT 46.5 01/15/2015 2155   HCT 44.1 05/30/2013 1646   HCT 24.8* 03/21/2010 0400   MCV 91.0 01/15/2015 2155   MCV 88.2 05/30/2013 1646   MCV 90.6 03/21/2010 0400    Lipid Panel     Component Value Date/Time   CHOL  03/08/2010 0411    81        ATP III CLASSIFICATION:  <200     mg/dL   Desirable  646-803  mg/dL   Borderline High  >=212    mg/dL   High          TRIG 50 03/08/2010 0411   HDL 25* 03/08/2010 0411   CHOLHDL 3.2 03/08/2010 0411   VLDL 10 03/08/2010 0411   LDLCALC  03/08/2010 0411    46        Total Cholesterol/HDL:CHD Risk Coronary Heart Disease Risk Table                     Men   Women  1/2 Average Risk   3.4   3.3  Average Risk       5.0   4.4  2 X Average Risk   9.6   7.1  3 X Average Risk  23.4   11.0        Use the calculated Patient Ratio above and the CHD Risk Table to determine the patient's CHD Risk.        ATP III CLASSIFICATION (LDL):  <100     mg/dL   Optimal  248-250  mg/dL   Near or Above  Optimal  130-159  mg/dL   Borderline  161-096  mg/dL   High  >045     mg/dL   Very High    ABG    Component Value Date/Time   PHART 7.420 12/10/2009 0921   PCO2ART 38.5 12/10/2009 0921   PO2ART 83.0 12/10/2009 0921   HCO3 25.0* 12/10/2009 0921   TCO2 26 12/10/2009 0921   ACIDBASEDEF 1.0 12/10/2009 0915    O2SAT 96.0 12/10/2009 0921     Lab Results  Component Value Date   TSH 0.567 01/16/2015   BNP (last 3 results)  Recent Labs  01/15/15 2155  BNP 428.0*    ProBNP (last 3 results) No results for input(s): PROBNP in the last 8760 hours.  Cardiac Panel (last 3 results) No results for input(s): CKTOTAL, CKMB, TROPONINI, RELINDX in the last 72 hours.  Iron/TIBC/Ferritin/ %Sat No results found for: IRON, TIBC, FERRITIN, IRONPCTSAT   EKG Orders placed or performed during the hospital encounter of 01/15/15  . ED EKG  . ED EKG  . EKG 12-Lead  . EKG 12-Lead  . EKG 12-Lead  . EKG 12-Lead  . EKG 12-Lead  . EKG 12-Lead  . EKG 12-Lead  . EKG 12-Lead  . EKG     Prior Assessment and Plan Problem List as of 05/06/2015      Cardiovascular and Mediastinum   Other specified forms of chronic ischemic heart disease   CARDIOMYOPATHY   Last Assessment & Plan 09/06/2011 Office Visit Written 09/06/2011 12:20 PM by Rollene Rotunda, MD    He seems to be euvolemic.  At this point, no change in therapy is indicated.  We have reviewed salt and fluid restrictions.  No further cardiovascular testing is indicated.        LAD stenosis   Coronary artery disease   Last Assessment & Plan 05/28/2013 Office Visit Written 05/28/2013 11:53 AM by Marinus Maw, MD    He denies anginal symptoms. He'll continue his current medical therapy.      Chronic systolic heart failure   Last Assessment & Plan 04/16/2015 Office Visit Written 04/16/2015 10:08 AM by Marinus Maw, MD    His chronic systolic heart failure is currently class 2. He will continue his current meds and is instructed to reduce his sodium intake.       Arteriosclerosis of coronary artery   Essential (primary) hypertension   Acute on chronic systolic congestive heart failure   CHF (congestive heart failure)   Essential hypertension   Last Assessment & Plan 04/16/2015 Office Visit Written 04/16/2015 10:08 AM by Marinus Maw, MD    His blood  pressure today is well controlled. He will continue his current meds.        Digestive   Acid reflux     Other   HYPERLIPIDEMIA   Last Assessment & Plan 03/20/2012 Office Visit Written 03/20/2012 12:09 PM by Rollene Rotunda, MD    His last LDL was 91 and HDL 45. He's going to try to improve his diet and have this repeated by his primary provider.      BACTEREMIA   Automatic implantable cardioverter-defibrillator in situ   Last Assessment & Plan 04/16/2015 Office Visit Written 04/16/2015 10:07 AM by Marinus Maw, MD    His St. Jude ICD is working normally. Will recheck in several months.      Hx of CABG   Last Assessment & Plan 04/27/2014 Office Visit Written 04/27/2014  8:33 AM by Marinus Maw, MD  He denies anginal symptoms and remains active. Will follow.          Imaging: No results found.

## 2015-05-06 NOTE — Patient Instructions (Signed)
Your physician wants you to follow-up in: 6 months. You will receive a reminder letter in the mail two months in advance. If you don't receive a letter, please call our office to schedule the follow-up appointment.  Your physician recommends that you continue on your current medications as directed. Please refer to the Current Medication list given to you today.   Thank you for choosing  [facility]!    

## 2015-07-19 ENCOUNTER — Telehealth: Payer: Self-pay | Admitting: Cardiology

## 2015-07-19 ENCOUNTER — Ambulatory Visit (INDEPENDENT_AMBULATORY_CARE_PROVIDER_SITE_OTHER): Payer: Medicare HMO | Admitting: *Deleted

## 2015-07-19 DIAGNOSIS — I255 Ischemic cardiomyopathy: Secondary | ICD-10-CM

## 2015-07-19 NOTE — Telephone Encounter (Signed)
Spoke with pt and reminded pt of remote transmission that is due today. Pt verbalized understanding.   

## 2015-07-20 NOTE — Progress Notes (Signed)
Remote ICD transmission.   

## 2015-07-23 LAB — CUP PACEART REMOTE DEVICE CHECK
Battery Voltage: 2.96 V
HighPow Impedance: 49 Ohm
Lead Channel Pacing Threshold Amplitude: 0.75 V
Lead Channel Pacing Threshold Pulse Width: 0.4 ms
Lead Channel Sensing Intrinsic Amplitude: 12 mV
Lead Channel Setting Pacing Amplitude: 2.5 V
Lead Channel Setting Sensing Sensitivity: 0.5 mV
MDC IDC MSMT BATTERY REMAINING LONGEVITY: 67 mo
MDC IDC MSMT BATTERY REMAINING PERCENTAGE: 56 %
MDC IDC MSMT LEADCHNL RV IMPEDANCE VALUE: 480 Ohm
MDC IDC SESS DTM: 20160822190439
MDC IDC SET LEADCHNL RV PACING PULSEWIDTH: 0.4 ms
MDC IDC STAT BRADY RV PERCENT PACED: 1 %
Pulse Gen Serial Number: 747960
Zone Setting Detection Interval: 270 ms

## 2015-07-29 ENCOUNTER — Encounter: Payer: Self-pay | Admitting: Cardiology

## 2015-08-12 ENCOUNTER — Encounter: Payer: Self-pay | Admitting: Internal Medicine

## 2015-10-20 ENCOUNTER — Ambulatory Visit (INDEPENDENT_AMBULATORY_CARE_PROVIDER_SITE_OTHER): Payer: Medicare HMO | Admitting: *Deleted

## 2015-10-20 DIAGNOSIS — I255 Ischemic cardiomyopathy: Secondary | ICD-10-CM

## 2015-10-20 NOTE — Progress Notes (Signed)
Remote ICD transmission.   

## 2015-11-01 LAB — CUP PACEART REMOTE DEVICE CHECK
Brady Statistic RV Percent Paced: 1 %
Date Time Interrogation Session: 20161123075911
HighPow Impedance: 42 Ohm
Implantable Lead Implant Date: 20110209
Implantable Lead Location: 753860
Lead Channel Impedance Value: 400 Ohm
Lead Channel Pacing Threshold Pulse Width: 0.4 ms
Lead Channel Sensing Intrinsic Amplitude: 11.9 mV
Lead Channel Setting Pacing Pulse Width: 0.4 ms
MDC IDC LEAD MODEL: 7120
MDC IDC MSMT BATTERY REMAINING LONGEVITY: 65 mo
MDC IDC MSMT BATTERY REMAINING PERCENTAGE: 54 %
MDC IDC MSMT BATTERY VOLTAGE: 2.96 V
MDC IDC MSMT LEADCHNL RV PACING THRESHOLD AMPLITUDE: 0.75 V
MDC IDC SET LEADCHNL RV PACING AMPLITUDE: 2.5 V
MDC IDC SET LEADCHNL RV SENSING SENSITIVITY: 0.5 mV
Pulse Gen Serial Number: 747960

## 2015-11-02 ENCOUNTER — Encounter: Payer: Self-pay | Admitting: Cardiology

## 2015-11-09 NOTE — Progress Notes (Signed)
HPI The patient returns for followup of coronary disease and cardiomyopathy. Since I last saw him he was in the hospital in February and I reviewed these records. He's been followed in our clinic since then. It seems that at that time he had a 14 pound weight gain and had been really weighing himself routinely. He was diuresed. His ejection fraction was unchanged at about 25-30%. However, since he is been out of hospital arrest of this year he's been much more vigilant about watching his weights. He watches his salt. He's only had to take Lasix once point thinks he got a hold of some increased salt and eating some country ham. He otherwise denies any new shortness of breath. He's had no palpitations, presyncope or syncope. He's had no PND or orthopnea.   No Known Allergies  Current Outpatient Prescriptions  Medication Sig Dispense Refill  . acetaminophen (TYLENOL) 500 MG tablet Take 500 mg by mouth every 6 (six) hours as needed for pain.    Marland Kitchen aspirin 81 MG tablet Take 81 mg by mouth at bedtime.     . bisoprolol (ZEBETA) 10 MG tablet Take 1 tablet (10 mg total) by mouth daily. 90 tablet 3  . dextromethorphan-guaiFENesin (MUCINEX DM) 30-600 MG per 12 hr tablet Take 1 tablet by mouth 2 (two) times daily as needed for cough.    . enalapril (VASOTEC) 20 MG tablet Take 0.5 tablets (10 mg total) by mouth daily. 45 tablet 3  . fish oil-omega-3 fatty acids 1000 MG capsule Take 1 g by mouth 3 (three) times daily.     . furosemide (LASIX) 20 MG tablet Take 1 tablet (20 mg total) by mouth daily. 90 tablet 3  . niacin (NIASPAN) 500 MG CR tablet Take 500 mg by mouth at bedtime.      . nitroGLYCERIN (NITROSTAT) 0.4 MG SL tablet Place 1 tablet (0.4 mg total) under the tongue every 5 (five) minutes as needed. 25 tablet 4  . omeprazole (PRILOSEC) 20 MG capsule Take 20 mg by mouth daily.     . potassium chloride (K-DUR,KLOR-CON) 10 MEQ tablet Take 1 tablet (10 mEq total) by mouth daily. 90 tablet 3  .  rosuvastatin (CRESTOR) 20 MG tablet Take 40 mg by mouth at bedtime.      No current facility-administered medications for this visit.    Past Medical History  Diagnosis Date  . Left ventricular dysfunction     apical thrombus  . CHF (congestive heart failure) (HCC)     EF 20%  . GI bleed     Mallory-Weiss tear EGD 02/2010  . Small bowel obstruction (HCC) 2007    resolved without surgery  . Ileus (HCC) 2006    required hospitalization  . Hyperlipidemia   . MRSA bacteremia   . Coronary artery disease   . 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 Jude    Past Surgical History  Procedure Laterality Date  . Coronary artery bypass graft      6 vessels, 2000  . Insert / replace / remove pacemaker  01/05/10    ICD insertion/St. Jude single chamber  . Basilic vein left arm resection      ROS    Otherwise stated in the HPI and negative for all other systems.   PHYSICAL EXAM BP 120/70 mmHg  Pulse 64  Ht 5\' 11"  (1.803 m)  Wt 186 lb (84.369 kg)  BMI 25.95 kg/m2 GENERAL:  Well appearing NECK:  No  jugular venous distention, waveform within normal limits, carotid upstroke brisk and symmetric, no bruits, no thyromegaly LYMPHATICS:  No cervical, inguinal adenopathy LUNGS:  Clear to auscultation bilaterally BACK:  No CVA tenderness CHEST:  Well healed sternotomy scar, ICD scar HEART:  PMI not displaced or sustained,S1 and S2 within normal limits, no S3, no S4, no clicks, no rubs, no murmurs ABD:  Flat, positive bowel sounds normal in frequency in pitch, no bruits, no rebound, no guarding, no midline pulsatile mass, no hepatomegaly, no splenomegaly EXT:  2 plus pulses throughout, trace edema, no cyanosis no clubbinga Skin:  No rash no nodules  EKG:  Sinus rhythm, rate 64, axis within normal limits, intervals within normal limits, premature ventricular interpolated traction, nonspecific T-wave flattening Nonspecific ST-T wave changes.  11/10/2015  ASSESSMENT AND PLAN   Coronary artery disease -  The patient has no new sypmtoms. No further cardiovascular testing is indicated. We will continue with aggressive risk reduction and meds as listed.  CARDIOMYOPATHY -  Mr. Sult seems to be euvolemic. At this point, no change in therapy is indicated..   ICD - He is up-to-date with followup.  I reviewed the most recent ICD transmission.  DYSLIPIDEMIA - I will stop his niacin since this is no longer suggested in combination with Crestor.

## 2015-11-10 ENCOUNTER — Ambulatory Visit (INDEPENDENT_AMBULATORY_CARE_PROVIDER_SITE_OTHER): Payer: Medicare HMO | Admitting: Cardiology

## 2015-11-10 ENCOUNTER — Encounter: Payer: Self-pay | Admitting: Cardiology

## 2015-11-10 VITALS — BP 120/70 | HR 64 | Ht 71.0 in | Wt 186.0 lb

## 2015-11-10 DIAGNOSIS — I2510 Atherosclerotic heart disease of native coronary artery without angina pectoris: Secondary | ICD-10-CM | POA: Diagnosis not present

## 2015-11-10 DIAGNOSIS — I5022 Chronic systolic (congestive) heart failure: Secondary | ICD-10-CM

## 2015-11-10 DIAGNOSIS — I10 Essential (primary) hypertension: Secondary | ICD-10-CM | POA: Diagnosis not present

## 2015-11-10 DIAGNOSIS — I255 Ischemic cardiomyopathy: Secondary | ICD-10-CM

## 2015-11-10 MED ORDER — ROSUVASTATIN CALCIUM 40 MG PO TABS: 40.0000 mg | ORAL_TABLET | Freq: Every day | ORAL | Status: DC

## 2015-11-10 NOTE — Patient Instructions (Signed)
Medication Instructions:  Please stop your Niacin. Continue all other medications as listed.  Follow-Up: Follow up in 1 year with Dr. Antoine Poche.  You will receive a letter in the mail 2 months before you are due.  Please call us when you receive this letter to schedule your follow up appointment.  If you need a refill on your cardiac medications before your next appointment, please call your pharmacy.  Thank you for choosing [facility]!!

## 2015-12-31 ENCOUNTER — Emergency Department (HOSPITAL_COMMUNITY)
Admission: EM | Admit: 2015-12-31 | Discharge: 2016-01-01 | Disposition: A | Discharge status: Home / Self Care | Payer: Medicare HMO | Attending: Emergency Medicine | Admitting: Emergency Medicine

## 2015-12-31 ENCOUNTER — Emergency Department (HOSPITAL_COMMUNITY): Payer: Medicare HMO

## 2015-12-31 ENCOUNTER — Encounter (HOSPITAL_COMMUNITY): Payer: Self-pay | Admitting: Emergency Medicine

## 2015-12-31 DIAGNOSIS — Z951 Presence of aortocoronary bypass graft: Secondary | ICD-10-CM | POA: Insufficient documentation

## 2015-12-31 DIAGNOSIS — Z8719 Personal history of other diseases of the digestive system: Secondary | ICD-10-CM | POA: Diagnosis not present

## 2015-12-31 DIAGNOSIS — Z95810 Presence of automatic (implantable) cardiac defibrillator: Secondary | ICD-10-CM | POA: Diagnosis not present

## 2015-12-31 DIAGNOSIS — R110 Nausea: Secondary | ICD-10-CM | POA: Insufficient documentation

## 2015-12-31 DIAGNOSIS — I509 Heart failure, unspecified: Secondary | ICD-10-CM | POA: Insufficient documentation

## 2015-12-31 DIAGNOSIS — Z87891 Personal history of nicotine dependence: Secondary | ICD-10-CM | POA: Insufficient documentation

## 2015-12-31 DIAGNOSIS — I2510 Atherosclerotic heart disease of native coronary artery without angina pectoris: Secondary | ICD-10-CM | POA: Diagnosis not present

## 2015-12-31 DIAGNOSIS — Z8614 Personal history of Methicillin resistant Staphylococcus aureus infection: Secondary | ICD-10-CM | POA: Insufficient documentation

## 2015-12-31 DIAGNOSIS — E785 Hyperlipidemia, unspecified: Secondary | ICD-10-CM | POA: Insufficient documentation

## 2015-12-31 DIAGNOSIS — Z7982 Long term (current) use of aspirin: Secondary | ICD-10-CM | POA: Diagnosis not present

## 2015-12-31 DIAGNOSIS — J40 Bronchitis, not specified as acute or chronic: Secondary | ICD-10-CM | POA: Insufficient documentation

## 2015-12-31 DIAGNOSIS — R0789 Other chest pain: Secondary | ICD-10-CM | POA: Insufficient documentation

## 2015-12-31 DIAGNOSIS — Z79899 Other long term (current) drug therapy: Secondary | ICD-10-CM | POA: Diagnosis not present

## 2015-12-31 DIAGNOSIS — R05 Cough: Secondary | ICD-10-CM | POA: Diagnosis not present

## 2015-12-31 LAB — CBC
HCT: 44.5 % (ref 39.0–52.0)
Hemoglobin: 15.1 g/dL (ref 13.0–17.0)
MCH: 30.3 pg (ref 26.0–34.0)
MCHC: 33.9 g/dL (ref 30.0–36.0)
MCV: 89.2 fL (ref 78.0–100.0)
PLATELETS: 250 10*3/uL (ref 150–400)
RBC: 4.99 MIL/uL (ref 4.22–5.81)
RDW: 13.1 % (ref 11.5–15.5)
WBC: 19 10*3/uL — AB (ref 4.0–10.5)

## 2015-12-31 LAB — BASIC METABOLIC PANEL
Anion gap: 12 (ref 5–15)
BUN: 20 mg/dL (ref 6–20)
CALCIUM: 9.6 mg/dL (ref 8.9–10.3)
CO2: 24 mmol/L (ref 22–32)
CREATININE: 1.42 mg/dL — AB (ref 0.61–1.24)
Chloride: 102 mmol/L (ref 101–111)
GFR calc non Af Amer: 51 mL/min — ABNORMAL LOW (ref >60)
GFR, EST AFRICAN AMERICAN: 60 mL/min — AB (ref >60)
Glucose, Bld: 111 mg/dL — ABNORMAL HIGH (ref 65–99)
Potassium: 4.4 mmol/L (ref 3.5–5.1)
Sodium: 138 mmol/L (ref 135–145)

## 2015-12-31 LAB — HEPATIC FUNCTION PANEL
ALT: 22 U/L (ref 17–63)
AST: 22 U/L (ref 15–41)
Albumin: 4 g/dL (ref 3.5–5.0)
Alkaline Phosphatase: 87 U/L (ref 38–126)
BILIRUBIN DIRECT: 0.1 mg/dL (ref 0.1–0.5)
BILIRUBIN INDIRECT: 0.8 mg/dL (ref 0.3–0.9)
Total Bilirubin: 0.9 mg/dL (ref 0.3–1.2)
Total Protein: 7.6 g/dL (ref 6.5–8.1)

## 2015-12-31 LAB — BRAIN NATRIURETIC PEPTIDE: B NATRIURETIC PEPTIDE 5: 171 pg/mL — AB (ref 0.0–100.0)

## 2015-12-31 LAB — TROPONIN I: Troponin I: <0.03 ng/mL (ref <0.031)

## 2015-12-31 LAB — LIPASE, BLOOD: LIPASE: 31 U/L (ref 11–51)

## 2015-12-31 MED ORDER — ALBUTEROL SULFATE HFA 108 (90 BASE) MCG/ACT IN AERS
2.0000 | INHALATION_SPRAY | RESPIRATORY_TRACT | Status: AC
  Administered 2016-01-01: 2 via RESPIRATORY_TRACT
  Filled 2015-12-31: qty 6.7

## 2015-12-31 MED ORDER — BENZONATATE 100 MG PO CAPS: 100.0000 mg | ORAL_CAPSULE | Freq: Three times a day (TID) | ORAL | Status: DC | PRN

## 2015-12-31 MED ORDER — IPRATROPIUM-ALBUTEROL 0.5-2.5 (3) MG/3ML IN SOLN
3.0000 mL | Freq: Once | RESPIRATORY_TRACT | Status: AC
  Administered 2015-12-31: 3 mL via RESPIRATORY_TRACT
  Filled 2015-12-31: qty 3

## 2015-12-31 NOTE — ED Notes (Signed)
Pt reports chest tightness and increased pain with cough/movement. Pt reports nausea.

## 2015-12-31 NOTE — ED Notes (Signed)
Brought patient into triage room to obtain updated vitals and give delay information. No acute distress noted at this time. Patient alert and oriented.

## 2015-12-31 NOTE — ED Provider Notes (Signed)
CSN: [id number]     Arrival date & time 12/31/15  1517 History   First MD Initiated Contact with Patient 12/31/15 2131     Chief Complaint  Patient presents with  . Chest Pain  . Cough     HPI Pt was seen at 2135. Per pt, c/o gradual onset and persistence of constant upper anterior chest wall "pain" since yesterday morning. Describes the CP as "tight," worsens with palpation of the area, cough, and body position changes. Has been associated with coughing, wheezing, and nausea. Denies any change in his symptoms since yesterday. Pt states his wife recently "had bronchitis." Denies palpitations, no SOB, no back pain, no abd pain, no vomiting/diarrhea, no fevers, no rash, no calf/LE pain or unilateral swelling.    Past Medical History  Diagnosis Date  . Left ventricular dysfunction     apical thrombus  . CHF (congestive heart failure) (HCC)     EF 20%  . GI bleed     Mallory-Weiss tear EGD 02/2010  . Small bowel obstruction (HCC) 2007    resolved without surgery  . Ileus (HCC) 2006    required hospitalization  . Hyperlipidemia   . MRSA bacteremia   . Coronary artery disease   . 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 Jude   Past Surgical History  Procedure Laterality Date  . Coronary artery bypass graft      6 vessels, 2000  . Insert / replace / remove pacemaker  01/05/10    ICD insertion/St. Jude single chamber  . Basilic vein left arm resection     Family History  Problem Relation Age of Onset  . Heart attack Mother   . Emphysema Father   . Coronary artery disease Father     s/p cabg   Social History  Substance Use Topics  . Smoking status: Former Smoker    Start date: 11/28/1967    Quit date: 02/29/1988  . Smokeless tobacco: Never Used  . Alcohol Use: No    Review of Systems ROS: Statement: All systems negative except as marked or noted in the HPI; Constitutional: Negative for fever and chills. ; ; Eyes: Negative for  eye pain, redness and discharge. ; ; ENMT: Negative for ear pain, hoarseness, nasal congestion, sinus pressure and sore throat. ; ; Cardiovascular: Negative for palpitations, diaphoresis, dyspnea and peripheral edema. ; ; Respiratory: +cough, wheezing. Negative for stridor. ; ; Gastrointestinal: +nausea. Negative for vomiting, diarrhea, abdominal pain, blood in stool, hematemesis, jaundice and rectal bleeding. . ; ; Genitourinary: Negative for dysuria, flank pain and hematuria. ; ; Musculoskeletal: +chest wall pain. Negative for back pain and neck pain. Negative for swelling and trauma.; ; Skin: Negative for pruritus, rash, abrasions, blisters, bruising and skin lesion.; ; Neuro: Negative for headache, lightheadedness and neck stiffness. Negative for weakness, altered level of consciousness , altered mental status, extremity weakness, paresthesias, involuntary movement, seizure and syncope.      Allergies  Review of patient's allergies indicates no known allergies.  Home Medications   Prior to Admission medications   Medication Sig Start Date End Date Taking? Authorizing Provider  acetaminophen (TYLENOL) 500 MG tablet Take 500 mg by mouth every 6 (six) hours as needed for pain.    Historical Provider, MD  aspirin 81 MG tablet Take 81 mg by mouth at bedtime.     Historical Provider, MD  bisoprolol (ZEBETA) 10 MG tablet Take 1 tablet (10 mg total) by mouth daily. 03/08/15  Jodelle Gross, NP  dextromethorphan-guaiFENesin [facility] DM) 30-600 MG per 12 hr tablet Take 1 tablet by mouth 2 (two) times daily as needed for cough.    Historical Provider, MD  enalapril (VASOTEC) 20 MG tablet Take 0.5 tablets (10 mg total) by mouth daily. 02/05/15   Jodelle Gross, NP  fish oil-omega-3 fatty acids 1000 MG capsule Take 1 g by mouth 3 (three) times daily.     Historical Provider, MD  furosemide (LASIX) 20 MG tablet Take 1 tablet (20 mg total) by mouth daily. 03/08/15   Jodelle Gross, NP  nitroGLYCERIN  (NITROSTAT) 0.4 MG SL tablet Place 1 tablet (0.4 mg total) under the tongue every 5 (five) minutes as needed. 04/02/14   Rollene Rotunda, MD  omeprazole (PRILOSEC) 20 MG capsule Take 20 mg by mouth daily.     Historical Provider, MD  potassium chloride (K-DUR,KLOR-CON) 10 MEQ tablet Take 1 tablet (10 mEq total) by mouth daily. 02/05/15   Jodelle Gross, NP  rosuvastatin (CRESTOR) 40 MG tablet Take 1 tablet (40 mg total) by mouth at bedtime. 11/10/15   Rollene Rotunda, MD   BP 158/91 mmHg  Pulse 71  Temp(Src) 98.7 F (37.1 C) (Oral)  Resp 16  Ht 5\' 11"  (1.803 m)  Wt 180 lb (81.647 kg)  BMI 25.12 kg/m2  SpO2 99% Physical Exam  2140: Physical examination:  Nursing notes reviewed; Vital signs and O2 SAT reviewed;  Constitutional: Well developed, Well nourished, Well hydrated, In no acute distress; Head:  Normocephalic, atraumatic; Eyes: EOMI, PERRL, No scleral icterus; ENMT: Mouth and pharynx normal, Mucous membranes moist; Neck: Supple, Full range of motion, No lymphadenopathy; Cardiovascular: Regular rate and rhythm, No gallop; Respiratory: Breath sounds coarse & equal bilaterally, No wheezes.  Speaking full sentences with ease, Normal respiratory effort/excursion; Chest: +upper anterior chest wall and bilat parasternal areas tender to palp. No rash, no deformity, no soft tissue crepitus. Movement normal; Abdomen: Soft, Nontender, Nondistended, Normal bowel sounds; Genitourinary: No CVA tenderness; Extremities: Pulses normal, No tenderness, No edema, No calf edema or asymmetry.; Neuro: AA&Ox3, Major CN grossly intact.  Speech clear. No gross focal motor or sensory deficits in extremities.; Skin: Color normal, Warm, Dry.   ED Course  Procedures (including critical care time) Labs Review  Imaging Review  I have personally reviewed and evaluated these images and lab results as part of my medical decision-making.   EKG Interpretation   Date/Time:  Friday December 31 2015 15:21:40  EST Ventricular Rate:  69 PR Interval:  150 QRS Duration: 98 QT Interval:  416 QTC Calculation: 445 R Axis:   58 Text Interpretation:  Sinus rhythm with Premature supraventricular  complexes Possible Left atrial enlargement Nonspecific ST and T wave  abnormality Abnormal ECG No significant change since last tracing  Confirmed by ZACKOWSKI  MD, SCOTT [phone]) on 12/31/2015 3:28:15 PM      MDM  MDM Reviewed: previous chart, nursing note and vitals Reviewed previous: labs and ECG Interpretation: labs, ECG and x-ray      Results for orders placed or performed during the hospital encounter of 12/31/15  Basic metabolic panel  Result Value Ref Range   Sodium 138 135 - 145 mmol/L   Potassium 4.4 3.5 - 5.1 mmol/L   Chloride 102 101 - 111 mmol/L   CO2 24 22 - 32 mmol/L   Glucose, Bld 111 (H) 65 - 99 mg/dL   BUN 20 6 - 20 mg/dL   Creatinine, Ser 1.03 (H) 0.61 - 1.24  mg/dL   Calcium 9.6 8.9 - 40.9 mg/dL   GFR calc non Af Amer 51 (L) >60 mL/min   GFR calc Af Amer 60 (L) >60 mL/min   Anion gap 12 5 - 15  CBC  Result Value Ref Range   WBC 19.0 (H) 4.0 - 10.5 K/uL   RBC 4.99 4.22 - 5.81 MIL/uL   Hemoglobin 15.1 13.0 - 17.0 g/dL   HCT 81.1 91.4 - 78.2 %   MCV 89.2 78.0 - 100.0 fL   MCH 30.3 26.0 - 34.0 pg   MCHC 33.9 30.0 - 36.0 g/dL   RDW 95.6 21.3 - 08.6 %   Platelets 250 150 - 400 K/uL  Troponin I  Result Value Ref Range   Troponin I <0.03 <0.031 ng/mL  Troponin I  Result Value Ref Range   Troponin I <0.03 <0.031 ng/mL  Brain natriuretic peptide  Result Value Ref Range   B Natriuretic Peptide 171.0 (H) 0.0 - 100.0 pg/mL  Lipase, blood  Result Value Ref Range   Lipase 31 11 - 51 U/L  Hepatic function panel  Result Value Ref Range   Total Protein 7.6 6.5 - 8.1 g/dL   Albumin 4.0 3.5 - 5.0 g/dL   AST 22 15 - 41 U/L   ALT 22 17 - 63 U/L   Alkaline Phosphatase 87 38 - 126 U/L   Total Bilirubin 0.9 0.3 - 1.2 mg/dL   Bilirubin, Direct 0.1 0.1 - 0.5 mg/dL   Indirect  Bilirubin 0.8 0.3 - 0.9 mg/dL   Dg Chest 2 View 04/02/8468  CLINICAL DATA:  Chest tightness and nausea, cough, prior bypass, history of CHF EXAM: CHEST  2 VIEW COMPARISON:  01/15/2015 FINDINGS: Left subclavian single lead defibrillator noted. Prior coronal bypass changes. Mild cardiomegaly without current CHF, focal pneumonia, collapse or consolidation. No edema, effusion or pneumothorax. Trachea is midline. Healed rib fracture on the right. No acute osseous finding. IMPRESSION: Postop changes. Cardiomegaly without CHF or focal pneumonia. No superimposed acute process. Electronically Signed   By: Judie Petit.  Shick M.D.   On: 12/31/2015 15:45    2330:  BNP improved from previous and no acute CHF on CXR. Doubt PE as cause for symptoms with low risk [place].  Doubt ACS as cause for symptoms with normal troponin x2 and unchanged EKG from previous after 2 days of constant symptoms. No vomiting while in the ED, abd remains benign. Feels better after neb and wants to go home now. Tx symptomatically at this time. Dx and testing d/w pt and family.  Questions answered.  Verb understanding, agreeable to d/c home with outpt f/u.    Samuel Jester, DO 01/05/16 [phone]

## 2015-12-31 NOTE — Discharge Instructions (Signed)
°Emergency Department Resource Guide °1) Find a Doctor and Pay Out of Pocket °Although you won't have to find out who is covered by your insurance plan, it is a good idea to ask around and get recommendations. You will then need to call the office and see if the doctor you have chosen will accept you as a new patient and what types of options they offer for patients who are self-pay. Some doctors offer discounts or will set up payment plans for their patients who do not have insurance, but you will need to ask so you aren't surprised when you get to your appointment. ° °2) Contact Your Local Health Department °Not all health departments have doctors that can see patients for sick visits, but many do, so it is worth a call to see if yours does. If you don't know where your local health department is, you can check in your phone book. The CDC also has a tool to help you locate your state's health department, and many state websites also have listings of all of their local health departments. ° °3) Find a Walk-in Clinic °If your illness is not likely to be very severe or complicated, you may want to try a walk in clinic. These are popping up all over the country in pharmacies, drugstores, and shopping centers. They're usually staffed by nurse practitioners or physician assistants that have been trained to treat common illnesses and complaints. They're usually fairly quick and inexpensive. However, if you have serious medical issues or chronic medical problems, these are probably not your best option. ° °No Primary Care Doctor: °- Call [facility] at  832-8000 - they can help you locate a primary care doctor that  accepts your insurance, provides certain services, etc. °- Physician Referral Service- 1-800-533-3463 ° °Chronic Pain Problems: °Organization         Address  Phone   Notes  °[facility]  ([phone]) [phone] Patients need to be referred by their primary care doctor.  ° °Medication  Assistance: °Organization         Address  Phone   Notes  °[place] [facility] [address] °[place], [place] [address] (336) 641-8030 --Must be a resident of [place] °-- Must have NO insurance coverage whatsoever (no Medicaid/ Medicare, etc.) °-- The pt. MUST have a primary care doctor that directs their care regularly and follows them in the community °  °[facility]  (866) 331-1348   °[employer]  (888) 892-1162   ° °Agencies that provide inexpensive medical care: °Organization         Address  Phone   Notes  °[facility]  (336) 832-8035   °[facility]    (336) 832-7272   °[facility] [address] °[place], [place] [address] (336) 832-4777   °[facility] [address], °[place] (336) 271-4999   °[facility]    (336) 373-0678   °[facility]    (336) 272-1050   °[facility] ° [address], [place] Phone:  (336) 832-4444, Fax:  (336) 832-4440 Hours of Operation:  9 am - 6 pm, M-F.  Also accepts Medicaid/Medicare and self-pay.  °[facility] ° [address], [place] Phone: (336) [phone], Fax: (336) [phone]. Hours of Operation:  8:30 am - 5:30 pm, M-F.  Also accepts Medicaid and self-pay.  °[facility] [place] [address], [place] Phone: (336) 878-6027   °[facility] [address], [place], [place] (336)723-1848, Ext. 123 Mondays & Thursdays: 7-9 AM.  First 15 patients are seen on a first come, first serve basis. °  ° °Medicaid-accepting [place] Providers: ° °Organization         Address  Phone   Notes  °[facility] [address], [place] (336) 641-2100 Also accepts self-pay patients.  °[facility] [address], [place] ° (336) 856-9996   °[facility] [address], [place]  (336) 288-8857   °[facility] [address] [place] [address], [place] (336) 299-7000   °Veita Bland [address], [place]  ° (336) 373-1557 Only accepts [place] Access Medicaid patients after they have their name applied to their card.  ° °Self-Pay (no insurance) in [place]: ° °Organization         Address  Phone   Notes  °Sickle Cell Patients, [facility] [address], [place] (336) 832-1970   °[facility] [address], [place] (336) 832-4400   °[facility] ° [address], [place] (336) 992-4800   °[facility]/Dr. Osei-Bonsu ° [address] [place] [address], [place] or [address], [place] (336) [phone] Phone number for both [place] and [place] locations is the same.  °[facility] [address], [place] (336) 299-0000   °[facility] [address] [place] [address], [place] or [address] (336) 852-7530 °(336) 878-2260   °[facility] [address], [place] (336) 350-1642, phone; (336) 294-5005, fax Sees patients 1st and 3rd Saturday of every month.  Must not qualify for public or private insurance (i.e. Medicaid, Medicare, [place] Health Choice, Veterans' Benefits) • Household income should be no more than 200% of the poverty level •The clinic cannot treat you if you are pregnant or think you are pregnant • Sexually transmitted diseases are not treated at the clinic.  ° ° °Dental Care: °Organization         Address  Phone  Notes  °[place] [facility] [address], [place] (336) 641-6152 Accepts children up to age 21 who are enrolled in Medicaid or [place] Health Choice; pregnant women with a Medicaid card; and children who have applied for Medicaid or [place] Health Choice, but were declined, whose parents can pay a reduced fee at time of service.  °[place]  [facility] [place]  [address], [place] (336) 641-7733 Accepts children up to age 21 who are enrolled in Medicaid or [place] Health Choice; pregnant women with a Medicaid card; and children who have applied for Medicaid or [place] Health Choice, but were declined, whose parents can pay a reduced fee at time of service.  °[facility] ° [address], [place] (336) 641-4533 Patients are seen by appointment only. Walk-ins are not accepted. [facility] will see patients 18 years of age and older. °Monday - Tuesday (8am-5pm) °Most Wednesdays (8:30-5pm) °$30 per visit, cash only  °[facility] ° [address], [place] (336) [phone] Patients are seen by appointment only. Walk-ins are not accepted. [facility] will see patients 18 years of age and older. °One   Wednesday Evening (Monthly: Volunteer Based).  $30 per visit, cash only  °[facility]  (919) 537-3737 for adults; Children under age 4, call Graduate Pediatric Dentistry at ([phone]) [phone]. Children aged 4-14, please call ([phone]) [phone] to request a pediatric application. ° Dental services are provided in all areas of dental care including fillings, crowns and bridges, complete and partial dentures, implants, gum treatment, root canals, and extractions. Preventive care is also provided. Treatment is provided to both adults and children. °Patients are selected via a lottery and there is often a waiting list. °  °[facility] [address], °[place] ° (336) 763-8833 [email or web address] °  °[facility] [address], [place], [place] (336)723-1848, Ext. 123 Second and Fourth Thursday of each month, opens at 6:30 AM; Clinic ends at 9 AM.  Patients are seen on a first-come first-served basis, and a limited number are seen during each clinic.  ° °[facility] ° [address], [place], [place] (336) [phone]    Eligibility Requirements °You must have lived in [place], [place], or [place] counties for at least the last three months. °  You cannot be eligible for state or federal sponsored healthcare insurance, including Veterans Administration, Medicaid, or Medicare. °  You generally cannot be eligible for healthcare insurance through your employer.  °  How to apply: °Eligibility screenings are held every Tuesday and Wednesday afternoon from 1:00 pm until 4:00 pm. You do not need an appointment for the interview!  °[facility] [address], [place], [place] 336-631-2330   °[facility]  336-342-8273   °[facility]  336-703-3100   °[facility]  336-570-6415   ° °Behavioral Health Resources in the Community: °Intensive Outpatient Programs °Organization         Address  Phone  Notes  °[facility] [address], [place], [place] 336-878-6098   °[facility] [address], [place], [place] 336-832-9800   °[facility] [address], [place], [place] ° 336-882-2125   °[facility] [address],  °[place], [place] 1-800-853-5163 or 336-641-4981   °Substance Abuse Resources °Organization         Address  Phone  Notes  °[facility]  336-882-2125   °[facility]  336-784-9470   °[facility]  336-285-9073   °[facility]  336-845-3988   °Residential & Outpatient Substance Abuse Program  1-800-659-3381   °Psychological Services °Organization         Address  Phone  Notes  °[facility]  336- 832-9600   °[facility]  336- 378-7881   °[facility] [address], [place] 1-800-853-5163 or 336-641-4981   ° °Mobile Crisis Teams °Organization         Address  Phone  Notes  °[facility], Mobile Crisis Care Unit  1-877-626-1772   °Assertive °[facility] ° [address]  [place], [place] 336-834-9664   °Sharon DeEsch [address] °[place] 336-554-5454   ° °Self-Help/Support Groups °Organization         Address  Phone             Notes  °[facility]  - variety of support groups  336- 373-1402 Call for more information  °Narcotics Anonymous (NA), [facility] [address], °[place]  2 meetings at this location  ° °  Residential Treatment Programs Organization         Address  Phone  Notes  [facility] [address],    [place]  5-361-443-1540   [facility]  [address], [place] [address], [place], [place] 950-932-6712   [facility] [address] [place], [place] 458-099-8338 Admissions: 8am-3pm M-F  [facility] [address]    [place], [place] 250-539-7673   [facility] [address] [place], [place], [place] 419-379-0240   [facility] [address],  [place], [place] [phone]   [facility] - Intensive Outpatient [address], [place], [place] 268-341-9622   [facility] ([facility] [address]  [place], [place] 2-979-892-1194 or 480-540-3045   [facility] [address], [place], [place] 856-314-9702 Accepts Medicaid  [facility] [address],  [place] 6-378-588-5027 Substance Abuse/Addiction Treatment   [facility] Organization         Address  Phone  Notes  [facility]  (248)533-9762   Angie Fava, PhD [address] [place], [place]   951-455-1912 or 947-334-2825   [facility]   [address] [place], [place] (838)741-3134   [facility] [address] [place], [place] 309-240-3167 Insurance/Medicaid/sponsorship through [facility] and Families [address]                                    [place], [place] [phone] Therapy/tele-psych/case    [facility] [address], [place] [phone]    Dr. Lolly Mustache  [phone]   [facility]  [employer] [facility] Dept. 1) [address], [place] 2) [address] [place] 3)  [address], [place] [phone] (339)[phone]  (425) 300-2138   [facility] Child Abuse Hotline 3183547428 or (564)183-5072 (After Hours)      Take the prescription as directed.  Use the albuterol inhaler (2 to 4 puffs) every 4 hours for the next 7 days, then as needed for cough, wheezing, or shortness of breath.  Call your regular medical doctor Monday morning to schedule a follow up appointment within the next 2 to 3 days.  Return to the Emergency Department immediately sooner if worsening.

## 2016-01-01 DIAGNOSIS — R110 Nausea: Secondary | ICD-10-CM | POA: Diagnosis not present

## 2016-01-01 DIAGNOSIS — Z8614 Personal history of Methicillin resistant Staphylococcus aureus infection: Secondary | ICD-10-CM | POA: Diagnosis not present

## 2016-01-01 DIAGNOSIS — Z95810 Presence of automatic (implantable) cardiac defibrillator: Secondary | ICD-10-CM | POA: Diagnosis not present

## 2016-01-01 DIAGNOSIS — I509 Heart failure, unspecified: Secondary | ICD-10-CM | POA: Diagnosis not present

## 2016-01-01 DIAGNOSIS — Z87891 Personal history of nicotine dependence: Secondary | ICD-10-CM | POA: Diagnosis not present

## 2016-01-01 DIAGNOSIS — I2510 Atherosclerotic heart disease of native coronary artery without angina pectoris: Secondary | ICD-10-CM | POA: Diagnosis not present

## 2016-01-01 DIAGNOSIS — J40 Bronchitis, not specified as acute or chronic: Secondary | ICD-10-CM | POA: Diagnosis not present

## 2016-01-01 DIAGNOSIS — E785 Hyperlipidemia, unspecified: Secondary | ICD-10-CM | POA: Diagnosis not present

## 2016-01-01 DIAGNOSIS — R0789 Other chest pain: Secondary | ICD-10-CM | POA: Diagnosis not present

## 2016-01-01 DIAGNOSIS — Z951 Presence of aortocoronary bypass graft: Secondary | ICD-10-CM | POA: Diagnosis not present

## 2016-01-11 DIAGNOSIS — I10 Essential (primary) hypertension: Secondary | ICD-10-CM | POA: Diagnosis not present

## 2016-01-11 DIAGNOSIS — E784 Other hyperlipidemia: Secondary | ICD-10-CM | POA: Diagnosis not present

## 2016-01-12 DIAGNOSIS — K219 Gastro-esophageal reflux disease without esophagitis: Secondary | ICD-10-CM | POA: Diagnosis not present

## 2016-01-12 DIAGNOSIS — I2510 Atherosclerotic heart disease of native coronary artery without angina pectoris: Secondary | ICD-10-CM | POA: Diagnosis not present

## 2016-01-12 DIAGNOSIS — I10 Essential (primary) hypertension: Secondary | ICD-10-CM | POA: Diagnosis not present

## 2016-01-19 ENCOUNTER — Ambulatory Visit (INDEPENDENT_AMBULATORY_CARE_PROVIDER_SITE_OTHER): Payer: Medicare HMO | Admitting: *Deleted

## 2016-01-19 DIAGNOSIS — I255 Ischemic cardiomyopathy: Secondary | ICD-10-CM | POA: Diagnosis not present

## 2016-01-19 NOTE — Progress Notes (Signed)
Remote ICD transmission.   

## 2016-02-15 LAB — CUP PACEART REMOTE DEVICE CHECK
Date Time Interrogation Session: 20170222082646
HighPow Impedance: 45 Ohm
Implantable Lead Implant Date: 20110209
Lead Channel Impedance Value: 450 Ohm
Lead Channel Sensing Intrinsic Amplitude: 12 mV
MDC IDC LEAD LOCATION: 753860
MDC IDC LEAD MODEL: 7120
MDC IDC MSMT BATTERY REMAINING LONGEVITY: 62 mo
MDC IDC MSMT BATTERY REMAINING PERCENTAGE: 52 %
MDC IDC MSMT BATTERY VOLTAGE: 2.95 V
MDC IDC SET LEADCHNL RV PACING AMPLITUDE: 2.5 V
MDC IDC SET LEADCHNL RV PACING PULSEWIDTH: 0.4 ms
MDC IDC SET LEADCHNL RV SENSING SENSITIVITY: 0.5 mV
MDC IDC STAT BRADY RV PERCENT PACED: 1 %
Pulse Gen Serial Number: 747960

## 2016-02-16 ENCOUNTER — Encounter: Payer: Self-pay | Admitting: Cardiology

## 2016-02-18 ENCOUNTER — Other Ambulatory Visit: Payer: Self-pay | Admitting: Adult Health

## 2016-02-21 ENCOUNTER — Telehealth: Payer: Self-pay | Admitting: Cardiology

## 2016-02-21 NOTE — Telephone Encounter (Signed)
Error/tg °

## 2016-03-19 ENCOUNTER — Other Ambulatory Visit: Payer: Self-pay | Admitting: Adult Health

## 2016-03-20 DIAGNOSIS — E785 Hyperlipidemia, unspecified: Secondary | ICD-10-CM | POA: Diagnosis not present

## 2016-03-27 ENCOUNTER — Emergency Department (HOSPITAL_COMMUNITY): Payer: Medicare HMO

## 2016-03-27 ENCOUNTER — Encounter (HOSPITAL_COMMUNITY): Payer: Self-pay | Admitting: *Deleted

## 2016-03-27 ENCOUNTER — Emergency Department (HOSPITAL_COMMUNITY)
Admission: EM | Admit: 2016-03-27 | Discharge: 2016-03-27 | Disposition: A | Discharge status: Home / Self Care | Payer: Medicare HMO | Attending: Emergency Medicine | Admitting: Emergency Medicine

## 2016-03-27 DIAGNOSIS — R0602 Shortness of breath: Secondary | ICD-10-CM | POA: Diagnosis not present

## 2016-03-27 DIAGNOSIS — I2510 Atherosclerotic heart disease of native coronary artery without angina pectoris: Secondary | ICD-10-CM | POA: Insufficient documentation

## 2016-03-27 DIAGNOSIS — R062 Wheezing: Secondary | ICD-10-CM | POA: Diagnosis not present

## 2016-03-27 DIAGNOSIS — E785 Hyperlipidemia, unspecified: Secondary | ICD-10-CM | POA: Diagnosis not present

## 2016-03-27 DIAGNOSIS — Z87891 Personal history of nicotine dependence: Secondary | ICD-10-CM | POA: Diagnosis not present

## 2016-03-27 DIAGNOSIS — Z7982 Long term (current) use of aspirin: Secondary | ICD-10-CM | POA: Insufficient documentation

## 2016-03-27 DIAGNOSIS — J9801 Acute bronchospasm: Secondary | ICD-10-CM

## 2016-03-27 DIAGNOSIS — J219 Acute bronchiolitis, unspecified: Secondary | ICD-10-CM | POA: Diagnosis not present

## 2016-03-27 DIAGNOSIS — I509 Heart failure, unspecified: Secondary | ICD-10-CM | POA: Insufficient documentation

## 2016-03-27 DIAGNOSIS — Z79899 Other long term (current) drug therapy: Secondary | ICD-10-CM | POA: Insufficient documentation

## 2016-03-27 DIAGNOSIS — R0682 Tachypnea, not elsewhere classified: Secondary | ICD-10-CM | POA: Diagnosis not present

## 2016-03-27 LAB — COMPREHENSIVE METABOLIC PANEL
ALBUMIN: 3.5 g/dL (ref 3.5–5.0)
ALT: 21 U/L (ref 17–63)
AST: 19 U/L (ref 15–41)
Alkaline Phosphatase: 73 U/L (ref 38–126)
Anion gap: 8 (ref 5–15)
BILIRUBIN TOTAL: 0.5 mg/dL (ref 0.3–1.2)
BUN: 16 mg/dL (ref 6–20)
CALCIUM: 8.9 mg/dL (ref 8.9–10.3)
CO2: 21 mmol/L — AB (ref 22–32)
CREATININE: 1.15 mg/dL (ref 0.61–1.24)
Chloride: 108 mmol/L (ref 101–111)
GFR calc Af Amer: >60 mL/min (ref >60)
GFR calc non Af Amer: >60 mL/min (ref >60)
GLUCOSE: 124 mg/dL — AB (ref 65–99)
Potassium: 3.9 mmol/L (ref 3.5–5.1)
SODIUM: 137 mmol/L (ref 135–145)
Total Protein: 6.6 g/dL (ref 6.5–8.1)

## 2016-03-27 LAB — CBC WITH DIFFERENTIAL/PLATELET
Basophils Absolute: 0 10*3/uL (ref 0.0–0.1)
Basophils Relative: 0 %
EOS ABS: 0.1 10*3/uL (ref 0.0–0.7)
EOS PCT: 1 %
HCT: 42.5 % (ref 39.0–52.0)
HEMOGLOBIN: 14.4 g/dL (ref 13.0–17.0)
LYMPHS ABS: 1.3 10*3/uL (ref 0.7–4.0)
Lymphocytes Relative: 15 %
MCH: 30.5 pg (ref 26.0–34.0)
MCHC: 33.9 g/dL (ref 30.0–36.0)
MCV: 90 fL (ref 78.0–100.0)
MONOS PCT: 6 %
Monocytes Absolute: 0.5 10*3/uL (ref 0.1–1.0)
Neutro Abs: 7.1 10*3/uL (ref 1.7–7.7)
Neutrophils Relative %: 78 %
PLATELETS: 164 10*3/uL (ref 150–400)
RBC: 4.72 MIL/uL (ref 4.22–5.81)
RDW: 13.3 % (ref 11.5–15.5)
WBC: 9.1 10*3/uL (ref 4.0–10.5)

## 2016-03-27 LAB — TROPONIN I

## 2016-03-27 LAB — BRAIN NATRIURETIC PEPTIDE: B Natriuretic Peptide: 279 pg/mL — ABNORMAL HIGH (ref 0.0–100.0)

## 2016-03-27 MED ORDER — IPRATROPIUM BROMIDE 0.02 % IN SOLN
0.5000 mg | Freq: Once | RESPIRATORY_TRACT | Status: DC
Start: 2016-03-27 — End: 2016-03-27

## 2016-03-27 MED ORDER — NITROGLYCERIN 2 % TD OINT
1.0000 [in_us] | TOPICAL_OINTMENT | Freq: Once | TRANSDERMAL | Status: AC
  Administered 2016-03-27: 1 [in_us] via TOPICAL
  Filled 2016-03-27: qty 1

## 2016-03-27 MED ORDER — PREDNISONE 20 MG PO TABS: ORAL_TABLET | ORAL | Status: DC

## 2016-03-27 MED ORDER — ALBUTEROL SULFATE (2.5 MG/3ML) 0.083% IN NEBU: 5.0000 mg | INHALATION_SOLUTION | Freq: Once | RESPIRATORY_TRACT | Status: DC

## 2016-03-27 MED ORDER — ALBUTEROL SULFATE HFA 108 (90 BASE) MCG/ACT IN AERS: 2.0000 | INHALATION_SPRAY | RESPIRATORY_TRACT | Status: DC | PRN

## 2016-03-27 MED ORDER — FUROSEMIDE 10 MG/ML IJ SOLN
INTRAMUSCULAR | Status: AC
  Filled 2016-03-27: qty 8

## 2016-03-27 MED ORDER — FUROSEMIDE 20 MG PO TABS: ORAL_TABLET | ORAL | Status: DC

## 2016-03-27 MED ORDER — METHYLPREDNISOLONE SODIUM SUCC 125 MG IJ SOLR
125.0000 mg | Freq: Once | INTRAMUSCULAR | Status: AC
  Administered 2016-03-27: 125 mg via INTRAVENOUS
  Filled 2016-03-27: qty 2

## 2016-03-27 MED ORDER — IPRATROPIUM-ALBUTEROL 0.5-2.5 (3) MG/3ML IN SOLN
3.0000 mL | Freq: Once | RESPIRATORY_TRACT | Status: AC
  Administered 2016-03-27: 3 mL via RESPIRATORY_TRACT
  Filled 2016-03-27: qty 3

## 2016-03-27 MED ORDER — ALBUTEROL SULFATE (2.5 MG/3ML) 0.083% IN NEBU
2.5000 mg | INHALATION_SOLUTION | Freq: Once | RESPIRATORY_TRACT | Status: AC
  Administered 2016-03-27: 2.5 mg via RESPIRATORY_TRACT
  Filled 2016-03-27: qty 3

## 2016-03-27 MED ORDER — FUROSEMIDE 10 MG/ML IJ SOLN
60.0000 mg | Freq: Once | INTRAMUSCULAR | Status: AC
  Administered 2016-03-27: 60 mg via INTRAVENOUS

## 2016-03-27 MED ORDER — AEROCHAMBER Z-STAT PLUS/MEDIUM MISC
1.0000 | Freq: Once | Status: DC
  Filled 2016-03-27: qty 1

## 2016-03-27 NOTE — ED Provider Notes (Signed)
CSN: [id number]     Arrival date & time 03/27/16  0112 History   First MD Initiated Contact with Patient 03/27/16 0125  AM   Chief Complaint  Patient presents with  . Shortness of Breath     (Consider location/radiation/quality/duration/timing/severity/associated sxs/prior Treatment) HPI patient reports about 11 PM tonight he started getting upper chest tightness and wheezing and felt short of breath. He states he's had a dry cough. He denies any fever or sore throat. He states he uses his inhaler which didn't help. However he states after EMS came they gave him 2 breathing treatments and he is feeling better. He states he's had some mild swelling of his lower extremities and states he did have a weight gain of 5 pounds over the last few days. He did take an extra Lasix 20 mg prior to arrival. He states he has felt similar to this when he had an exacerbation of his congestive heart failure before. Patient has a defibrillator.   PCP Dr Lysbeth Galas Cardiology Dr Antoine Poche  Past Medical History  Diagnosis Date  . Left ventricular dysfunction     apical thrombus  . CHF (congestive heart failure) (HCC)     EF 20%  . GI bleed     Mallory-Weiss tear EGD 02/2010  . Small bowel obstruction (HCC) 2007    resolved without surgery  . Ileus (HCC) 2006    required hospitalization  . Hyperlipidemia   . MRSA bacteremia   . Coronary artery disease   . 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 Jude   Past Surgical History  Procedure Laterality Date  . Coronary artery bypass graft      6 vessels, 2000  . Insert / replace / remove pacemaker  01/05/10    ICD insertion/St. Jude single chamber  . Basilic vein left arm resection     Family History  Problem Relation Age of Onset  . Heart attack Mother   . Emphysema Father   . Coronary artery disease Father     s/p cabg   Social History  Substance Use Topics  . Smoking status: Former Smoker    Start  date: 11/28/1967    Quit date: 02/29/1988  . Smokeless tobacco: Never Used  . Alcohol Use: No  lives at home Lives with spouse  Review of Systems  All other systems reviewed and are negative.     Allergies  Review of patient's allergies indicates no known allergies.  Home Medications   Prior to Admission medications   Medication Sig Start Date End Date Taking? Authorizing Provider  atorvastatin (LIPITOR) 80 MG tablet Take 80 mg by mouth daily.   Yes Historical Provider, MD  acetaminophen (TYLENOL) 500 MG tablet Take 500 mg by mouth every 6 (six) hours as needed for pain.    Historical Provider, MD  albuterol (PROVENTIL HFA;VENTOLIN HFA) 108 (90 Base) MCG/ACT inhaler Inhale 2 puffs into the lungs every 4 (four) hours as needed. 03/27/16   Devoria Albe, MD  aspirin 81 MG tablet Take 81 mg by mouth at bedtime.     Historical Provider, MD  benzonatate (TESSALON) 100 MG capsule Take 1 capsule (100 mg total) by mouth 3 (three) times daily as needed for cough. 12/31/15   Samuel Jester, DO  bisoprolol (ZEBETA) 10 MG tablet Take 1 tablet (10 mg total) by mouth daily. 03/08/15   Jodelle Gross, NP  dextromethorphan-guaiFENesin [facility] DM) 30-600 MG per 12 hr tablet Take 1 tablet by  mouth 2 (two) times daily as needed for cough.    Historical Provider, MD  enalapril (VASOTEC) 20 MG tablet TAKE ONE-HALF TABLET BY MOUTH ONCE DAILY 02/21/16   Jodelle Gross, NP  fish oil-omega-3 fatty acids 1000 MG capsule Take 1 g by mouth 3 (three) times daily.     Historical Provider, MD  furosemide (LASIX) 20 MG tablet TAKE ONE TABLET BY MOUTH ONCE DAILY 03/20/16   Jodelle Gross, NP  furosemide (LASIX) 20 MG tablet Take 2 po daily until the swelling is gone in your ankles, then one a day 03/27/16   Devoria Albe, MD  KLOR-CON M10 10 MEQ tablet TAKE ONE TABLET BY MOUTH ONCE DAILY 02/21/16   Jodelle Gross, NP  nitroGLYCERIN (NITROSTAT) 0.4 MG SL tablet Place 1 tablet (0.4 mg total) under the tongue every 5  (five) minutes as needed. 04/02/14   Rollene Rotunda, MD  omeprazole (PRILOSEC) 20 MG capsule Take 20 mg by mouth daily.     Historical Provider, MD  predniSONE (DELTASONE) 20 MG tablet Take 2 po QD x 5d 03/27/16   Devoria Albe, MD  rosuvastatin (CRESTOR) 40 MG tablet Take 1 tablet (40 mg total) by mouth at bedtime. 11/10/15   Rollene Rotunda, MD   BP 131/78 mmHg  Pulse 85  Resp 15  SpO2 94%  Vital signs normal    Physical Exam  Constitutional: He is oriented to person, place, and time. He appears well-developed and well-nourished.  Non-toxic appearance. He does not appear ill. No distress.  HENT:  Head: Normocephalic and atraumatic.  Right Ear: External ear normal.  Left Ear: External ear normal.  Nose: Nose normal. No mucosal edema or rhinorrhea.  Mouth/Throat: Oropharynx is clear and moist and mucous membranes are normal. No dental abscesses or uvula swelling.  Eyes: Conjunctivae and EOM are normal. Pupils are equal, round, and reactive to light.  Neck: Normal range of motion and full passive range of motion without pain. Neck supple.  Cardiovascular: Normal rate, regular rhythm and normal heart sounds.  Exam reveals no gallop and no friction rub.   No murmur heard. Pulmonary/Chest: Effort normal. No respiratory distress. He has wheezes. He has no rhonchi. He has no rales. He exhibits no tenderness and no crepitus.  Patient has late end expiratory high-pitched wheezing  Abdominal: Soft. Normal appearance and bowel sounds are normal. He exhibits no distension. There is no tenderness. There is no rebound and no guarding.  Musculoskeletal: Normal range of motion. He exhibits edema. He exhibits no tenderness.  Moves all extremities well. Patient has some 1+ pitting around his ankles and just trace to the  Mid lower leg  Neurological: He is alert and oriented to person, place, and time. He has normal strength. No cranial nerve deficit.  Skin: Skin is warm, dry and intact. No rash noted. No  erythema. No pallor.  Psychiatric: He has a normal mood and affect. His speech is normal and behavior is normal. His mood appears not anxious.  Nursing note and vitals reviewed.   ED Course  Procedures (including critical care time)  Medications  furosemide (LASIX) 10 MG/ML injection (not administered)  aerochamber Z-Stat Plus/medium 1 each (not administered)  furosemide (LASIX) injection 60 mg (60 mg Intravenous Given 03/27/16 0209)  nitroGLYCERIN (NITROGLYN) 2 % ointment 1 inch (1 inch Topical Given 03/27/16 0209)  ipratropium-albuterol (DUONEB) 0.5-2.5 (3) MG/3ML nebulizer solution 3 mL (3 mLs Nebulization Given 03/27/16 0204)  albuterol (PROVENTIL) (2.5 MG/3ML) 0.083% nebulizer solution 2.5 mg (  2.5 mg Nebulization Given 03/27/16 0204)  methylPREDNISolone sodium succinate (SOLU-MEDROL) 125 mg/2 mL injection 125 mg (125 mg Intravenous Given 03/27/16 0335)   Patient had artery receive a nebulizer treatment by EMS 2. He was given the third in emergency department with albuterol and Atrovent. He was given Lasix 60 mg IV and had nitroglycerin ointment placed his chest.  Patient was given Solu-Medrol IV after reviewing his chest x-ray which did not reveal a pneumonia.  Patient was rechecked at 3:20 AM. He states his breathing is much improved. His lungs are now clear. I am going to have nursing staff ambulating and see how he does with exertion and make sure his oxygen doesn't drop.  Patient was ambulated by nursing staff and maintain his oxygen at 96%. He states he didn't make his breathing worse or make his chest feel tight. He states he is not coughing since he had the nebulizer treatment. He has put out over 1000 mL of urine after the Lasix. We discussed taking a higher dose of Lasix for the next couple days until the swelling in his ankles is gone and then back down to his usual dose which is one tablet a day. He was put on prednisone 40 mg a day for the next 5 days. He was given a prescription for  another inhaler although he states this inhaler is fairly new. He was given a spacer to use with the inhaler to make it more effective. Patient is feeling much improved and feels ready to be discharged home.   Labs Review Results for orders placed or performed during the hospital encounter of 03/27/16  Comprehensive metabolic panel  Result Value Ref Range   Sodium 137 135 - 145 mmol/L   Potassium 3.9 3.5 - 5.1 mmol/L   Chloride 108 101 - 111 mmol/L   CO2 21 (L) 22 - 32 mmol/L   Glucose, Bld 124 (H) 65 - 99 mg/dL   BUN 16 6 - 20 mg/dL   Creatinine, Ser 1.61 0.61 - 1.24 mg/dL   Calcium 8.9 8.9 - 09.6 mg/dL   Total Protein 6.6 6.5 - 8.1 g/dL   Albumin 3.5 3.5 - 5.0 g/dL   AST 19 15 - 41 U/L   ALT 21 17 - 63 U/L   Alkaline Phosphatase 73 38 - 126 U/L   Total Bilirubin 0.5 0.3 - 1.2 mg/dL   GFR calc non Af Amer >60 >60 mL/min   GFR calc Af Amer >60 >60 mL/min   Anion gap 8 5 - 15  Brain natriuretic peptide  Result Value Ref Range   B Natriuretic Peptide 279.0 (H) 0.0 - 100.0 pg/mL  CBC with Differential  Result Value Ref Range   WBC 9.1 4.0 - 10.5 K/uL   RBC 4.72 4.22 - 5.81 MIL/uL   Hemoglobin 14.4 13.0 - 17.0 g/dL   HCT 04.5 40.9 - 81.1 %   MCV 90.0 78.0 - 100.0 fL   MCH 30.5 26.0 - 34.0 pg   MCHC 33.9 30.0 - 36.0 g/dL   RDW 91.4 78.2 - 95.6 %   Platelets 164 150 - 400 K/uL   Neutrophils Relative % 78 %   Neutro Abs 7.1 1.7 - 7.7 K/uL   Lymphocytes Relative 15 %   Lymphs Abs 1.3 0.7 - 4.0 K/uL   Monocytes Relative 6 %   Monocytes Absolute 0.5 0.1 - 1.0 K/uL   Eosinophils Relative 1 %   Eosinophils Absolute 0.1 0.0 - 0.7 K/uL   Basophils Relative  0 %   Basophils Absolute 0.0 0.0 - 0.1 K/uL  Troponin I  Result Value Ref Range   Troponin I <0.03 <0.031 ng/mL   Laboratory interpretation all normal except mildly elevated BNP     Imaging Review Dg Chest 2 View  03/27/2016  CLINICAL DATA:  Wheezing. EXAM: CHEST  2 VIEW COMPARISON:  12/31/2015 FINDINGS: There is  unchanged hyperinflation. Borderline heart size, unchanged. The lungs are clear except for mild chronic appearing interstitial coarsening. No pleural effusions. Unremarkable hilar and mediastinal contours. There is prior sternotomy and CABG. There are intact appearances of the transvenous cardiac lead. IMPRESSION: Unchanged hyperinflation and borderline heart size. No acute cardiopulmonary findings. Electronically Signed   By: Ellery Plunk M.D.   On: 03/27/2016 02:34   I have personally reviewed and evaluated these images and lab results as part of my medical decision-making.   EKG Interpretation   Date/Time:  Monday Mar 27 2016 01:12:30 EDT Ventricular Rate:  88 PR Interval:  153 QRS Duration: 91 QT Interval:  364 QTC Calculation: 440 R Axis:   72 Text Interpretation:  Sinus rhythm Biatrial enlargement Anteroseptal  infarct, old Nonspecific repol abnrm, inferolateral lds Since last tracing  rate faster (31 Dec 2015) Confirmed by Morgaine Kimball  MD-I, Seung Nidiffer ([id number]) on  03/27/2016 1:16:37 AM      MDM   patient presents with shortness of breath and chest tightness which improved with nebulizers, IV Lasix, and oral steroids. He may have had a component of congestive heart failure and COPD exacerbation. Patient was improved and was discharged home.    Final diagnoses:  Bronchospasm   New Prescriptions   ALBUTEROL (PROVENTIL HFA;VENTOLIN HFA) 108 (90 BASE) MCG/ACT INHALER    Inhale 2 puffs into the lungs every 4 (four) hours as needed.   FUROSEMIDE (LASIX) 20 MG TABLET    Take 2 po daily until the swelling is gone in your ankles, then one a day   PREDNISONE (DELTASONE) 20 MG TABLET    Take 2 po QD x 5d    Plan discharge  Devoria Albe, MD, Concha Pyo, MD 03/27/16 [phone]

## 2016-03-27 NOTE — ED Notes (Addendum)
Pt arrived to er via [employer] with c/o sob, cough, tightness around throat area that started today, pt received two breathing tx's enroute with ems with improvement in sob, denies any fever, chills.

## 2016-03-27 NOTE — Discharge Instructions (Signed)
For the next 1-2 days take 40 mg of Lasix a day until the swelling is gone in your ankles then go back down to taking 20 mg a day. Usually your inhaler for wheezing or shortness of breath. Take the prednisone daily for the next 5 days. Recheck if you get a fever, worsening cough, ear breathing gets bad again, or you get chest pain. Use the spacer with your inhaler to make it more effective.

## 2016-03-27 NOTE — ED Notes (Signed)
Pt works for [employer] and has- He feels that he has wheezing to his upper airway- He reports that he had two treatments on the way in and felt better initially but feels again as his throat is tight- He reports that he is a former smoker who stopped smoking 30 years ago

## 2016-04-19 ENCOUNTER — Other Ambulatory Visit: Payer: Self-pay | Admitting: Cardiology

## 2016-04-19 ENCOUNTER — Telehealth: Payer: Self-pay | Admitting: Cardiology

## 2016-04-19 MED ORDER — BISOPROLOL FUMARATE 10 MG PO TABS: 10.0000 mg | ORAL_TABLET | Freq: Every day | ORAL | Status: DC

## 2016-04-19 NOTE — Telephone Encounter (Signed)
Needs refill on Bisoprolil sent to [facility] [place]

## 2016-04-19 NOTE — Telephone Encounter (Signed)
Patient's Bisoprolol refill was sent to [facility]. Patient was requesting pharmacy change and to have it sent to [facility] [place]

## 2016-04-19 NOTE — Telephone Encounter (Signed)
Rx(s) sent to pharmacy electronically.  

## 2016-04-20 MED ORDER — BISOPROLOL FUMARATE 10 MG PO TABS: 10.0000 mg | ORAL_TABLET | Freq: Every day | ORAL | Status: DC

## 2016-04-20 NOTE — Telephone Encounter (Signed)
Duplicate phone note opened in error 

## 2016-04-20 NOTE — Telephone Encounter (Signed)
rx sent to [facility] patient directly notified

## 2016-04-26 ENCOUNTER — Encounter: Payer: Self-pay | Admitting: Cardiology

## 2016-04-26 ENCOUNTER — Ambulatory Visit (INDEPENDENT_AMBULATORY_CARE_PROVIDER_SITE_OTHER): Payer: Medicare HMO | Admitting: Cardiology

## 2016-04-26 VITALS — BP 112/78 | HR 76 | Ht 71.0 in | Wt 186.0 lb

## 2016-04-26 DIAGNOSIS — I5043 Acute on chronic combined systolic (congestive) and diastolic (congestive) heart failure: Secondary | ICD-10-CM

## 2016-04-26 NOTE — Patient Instructions (Signed)
Medication Instructions:  The current medical regimen is effective;  continue present plan and medications.  Follow-Up: Follow up in 3 months with Dr Hochrein.  If you need a refill on your cardiac medications before your next appointment, please call your pharmacy.  Thank you for choosing [facility]!!     

## 2016-04-26 NOTE — Progress Notes (Signed)
HPI The patient returns for followup of coronary disease and cardiomyopathy. Since I last saw him he was in the ED earlier this month.  I reviewed this result.  He did have a 5 pound weight gain suddenly that day.  He did not recall any change in his diet or reason for this sudden change.  In the ED his BNP was slightly elevated.  He was treated with albuterol and sent home on a steroid taper. He was treated with IV Lasix as well. His chest x-ray did suggest some edema. He improved back to baseline with this and his weight went down. Since then his weight fluctuates by a pound or 2 given day but goes down. He is back to baseline. He denies any PND or orthopnea. He's had no swelling or edema. He denies any chest pressure, neck or arm discomfort. He's up-to-date with his device checks and I did review his most recent one. He's had some fluctuations in his thoracic impedance but doesn't stay abnormal prolonged. He is watching his diet and his fluid intake.   No Known Allergies  Current Outpatient Prescriptions  Medication Sig Dispense Refill  . acetaminophen (TYLENOL) 500 MG tablet Take 500 mg by mouth every 6 (six) hours as needed for pain.    Marland Kitchen aspirin 81 MG tablet Take 81 mg by mouth at bedtime.     Marland Kitchen atorvastatin (LIPITOR) 80 MG tablet Take 80 mg by mouth daily.    . benzonatate (TESSALON) 100 MG capsule Take 1 capsule (100 mg total) by mouth 3 (three) times daily as needed for cough. 15 capsule 0  . bisoprolol (ZEBETA) 10 MG tablet Take 1 tablet (10 mg total) by mouth daily. 90 tablet 1  . dextromethorphan-guaiFENesin (MUCINEX DM) 30-600 MG per 12 hr tablet Take 1 tablet by mouth 2 (two) times daily as needed for cough.    . enalapril (VASOTEC) 20 MG tablet TAKE ONE-HALF TABLET BY MOUTH ONCE DAILY 45 tablet 0  . fish oil-omega-3 fatty acids 1000 MG capsule Take 1 g by mouth 3 (three) times daily.     . furosemide (LASIX) 20 MG tablet TAKE ONE TABLET BY MOUTH ONCE DAILY 90 tablet 1  . KLOR-CON  M10 10 MEQ tablet TAKE ONE TABLET BY MOUTH ONCE DAILY 90 tablet 1  . nitroGLYCERIN (NITROSTAT) 0.4 MG SL tablet Place 1 tablet (0.4 mg total) under the tongue every 5 (five) minutes as needed. 25 tablet 4  . omeprazole (PRILOSEC) 20 MG capsule Take 20 mg by mouth daily.     Marland Kitchen albuterol (PROVENTIL HFA;VENTOLIN HFA) 108 (90 Base) MCG/ACT inhaler Inhale 2 puffs into the lungs every 4 (four) hours as needed. (Patient not taking: Reported on 04/26/2016) 6.7 g 0   No current facility-administered medications for this visit.    Past Medical History  Diagnosis Date  . Left ventricular dysfunction     apical thrombus  . CHF (congestive heart failure) (HCC)     EF 20%  . GI bleed     Mallory-Weiss tear EGD 02/2010  . Small bowel obstruction (HCC) 2007    resolved without surgery  . Ileus (HCC) 2006    required hospitalization  . Hyperlipidemia   . MRSA bacteremia   . Coronary artery disease   . 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 Jude    Past Surgical History  Procedure Laterality Date  . Coronary artery bypass graft      6 vessels, 2000  .  Insert / replace / remove pacemaker  01/05/10    ICD insertion/St. Jude single chamber  . Basilic vein left arm resection      ROS    Otherwise stated in the HPI and negative for all other systems.   PHYSICAL EXAM BP 112/78 mmHg  Pulse 76  Ht 5\' 11"  (1.803 m)  Wt 186 lb (84.369 kg)  BMI 25.95 kg/m2 GENERAL:  Well appearing NECK:  No jugular venous distention, waveform within normal limits, carotid upstroke brisk and symmetric, no bruits, no thyromegaly LYMPHATICS:  No cervical, inguinal adenopathy LUNGS:  Clear to auscultation bilaterally BACK:  No CVA tenderness CHEST:  Well healed sternotomy scar, ICD scar HEART:  PMI not displaced or sustained,S1 and S2 within normal limits, no S3, no S4, no clicks, no rubs, no murmurs ABD:  Flat, positive bowel sounds normal in frequency in pitch, no  bruits, no rebound, no guarding, no midline pulsatile mass, no hepatomegaly, no splenomegaly EXT:  2 plus pulses throughout, trace edema, no cyanosis no clubbinga    ASSESSMENT AND PLAN   Coronary artery disease -  The patient has no new sypmtoms. No further cardiovascular testing is indicated. We will continue with aggressive risk reduction and meds as listed.  CARDIOMYOPATHY -  Mr. Sowles seems to be euvolemic. At this point, no change in therapy is indicated.  I did review the labs and results and ED MD report from earlier this month.    ICD - He is up-to-date with followup.  I reviewed the most recent ICD transmission.  DYSLIPIDEMIA - He is back on Lipitor.  He is due to have follow up of this.

## 2016-05-17 ENCOUNTER — Telehealth: Payer: Self-pay | Admitting: Cardiology

## 2016-05-17 NOTE — Telephone Encounter (Signed)
Spoke w/ pt and requested that he send a manual transmission b/c his home monitor has not updated in at least 8 days.   

## 2016-05-19 DIAGNOSIS — E784 Other hyperlipidemia: Secondary | ICD-10-CM | POA: Diagnosis not present

## 2016-05-19 DIAGNOSIS — R799 Abnormal finding of blood chemistry, unspecified: Secondary | ICD-10-CM | POA: Diagnosis not present

## 2016-05-19 DIAGNOSIS — Z125 Encounter for screening for malignant neoplasm of prostate: Secondary | ICD-10-CM | POA: Diagnosis not present

## 2016-05-22 ENCOUNTER — Telehealth: Payer: Self-pay | Admitting: Adult Health

## 2016-05-22 DIAGNOSIS — I509 Heart failure, unspecified: Secondary | ICD-10-CM | POA: Diagnosis not present

## 2016-05-22 DIAGNOSIS — E785 Hyperlipidemia, unspecified: Secondary | ICD-10-CM | POA: Diagnosis not present

## 2016-05-22 DIAGNOSIS — I10 Essential (primary) hypertension: Secondary | ICD-10-CM | POA: Diagnosis not present

## 2016-05-22 NOTE — Telephone Encounter (Signed)
[facility] stating the [facility] is [facility] [place] [facility] [place] and doesn't have any refills

## 2016-05-22 NOTE — Telephone Encounter (Signed)
Dr Antoine Poche pt,to [place]

## 2016-05-24 ENCOUNTER — Other Ambulatory Visit: Payer: Self-pay

## 2016-05-24 MED ORDER — POTASSIUM CHLORIDE CRYS ER 10 MEQ PO TBCR: 10.0000 meq | EXTENDED_RELEASE_TABLET | Freq: Every day | ORAL | Status: DC

## 2016-05-24 MED ORDER — NITROGLYCERIN 0.4 MG SL SUBL: 0.4000 mg | SUBLINGUAL_TABLET | SUBLINGUAL | Status: AC | PRN

## 2016-05-24 MED ORDER — ATORVASTATIN CALCIUM 80 MG PO TABS: 80.0000 mg | ORAL_TABLET | Freq: Every day | ORAL | Status: DC

## 2016-05-24 MED ORDER — ENALAPRIL MALEATE 20 MG PO TABS: 10.0000 mg | ORAL_TABLET | Freq: Every day | ORAL | Status: DC

## 2016-05-24 MED ORDER — OMEPRAZOLE 20 MG PO CPDR: 20.0000 mg | DELAYED_RELEASE_CAPSULE | Freq: Every day | ORAL | Status: DC

## 2016-05-24 MED ORDER — BISOPROLOL FUMARATE 10 MG PO TABS: 10.0000 mg | ORAL_TABLET | Freq: Every day | ORAL | Status: DC

## 2016-05-24 MED ORDER — FUROSEMIDE 20 MG PO TABS: 20.0000 mg | ORAL_TABLET | Freq: Every day | ORAL | Status: DC

## 2016-05-24 NOTE — Telephone Encounter (Signed)
All med refills sent to [facility]

## 2016-05-24 NOTE — Telephone Encounter (Signed)
Please advise 

## 2016-05-26 ENCOUNTER — Encounter: Payer: Self-pay | Admitting: Internal Medicine

## 2016-05-26 ENCOUNTER — Ambulatory Visit (INDEPENDENT_AMBULATORY_CARE_PROVIDER_SITE_OTHER): Payer: Medicare HMO | Admitting: Internal Medicine

## 2016-05-26 VITALS — BP 108/62 | HR 54 | Ht 71.0 in | Wt 189.0 lb

## 2016-05-26 DIAGNOSIS — I5022 Chronic systolic (congestive) heart failure: Secondary | ICD-10-CM

## 2016-05-26 DIAGNOSIS — I255 Ischemic cardiomyopathy: Secondary | ICD-10-CM | POA: Diagnosis not present

## 2016-05-26 LAB — CUP PACEART INCLINIC DEVICE CHECK
Date Time Interrogation Session: 20170630140056
Implantable Lead Implant Date: 20110209
MDC IDC LEAD LOCATION: 753860
MDC IDC LEAD MODEL: 7120
MDC IDC SET LEADCHNL RV PACING AMPLITUDE: 2.5 V
MDC IDC SET LEADCHNL RV PACING PULSEWIDTH: 0.4 ms
MDC IDC SET LEADCHNL RV SENSING SENSITIVITY: 0.5 mV
Pulse Gen Serial Number: 747960

## 2016-05-26 MED ORDER — POTASSIUM CHLORIDE CRYS ER 20 MEQ PO TBCR: 20.0000 meq | EXTENDED_RELEASE_TABLET | Freq: Every day | ORAL | Status: DC

## 2016-05-26 MED ORDER — FUROSEMIDE 40 MG PO TABS: 40.0000 mg | ORAL_TABLET | Freq: Every day | ORAL | Status: DC

## 2016-05-26 MED ORDER — POTASSIUM CHLORIDE CRYS ER 10 MEQ PO TBCR: 20.0000 meq | EXTENDED_RELEASE_TABLET | Freq: Every day | ORAL | Status: DC

## 2016-05-26 MED ORDER — FUROSEMIDE 20 MG PO TABS: 40.0000 mg | ORAL_TABLET | Freq: Every day | ORAL | Status: DC

## 2016-05-26 NOTE — Patient Instructions (Signed)
Your physician wants you to follow-up in: 1 Year with Dr. Ladona Ridgel. You will receive a reminder letter in the mail two months in advance. If you don't receive a letter, please call our office to schedule the follow-up appointment.  Your physician recommends that you return for lab work in: 2 weeks (BMET)  Remote monitoring is used to monitor your Pacemaker of ICD from home. This monitoring reduces the number of office visits required to check your device to one time per year. It allows [place] to keep an eye on the functioning of your device to ensure it is working properly. You are scheduled for a device check from home on 08/28/16. You may send your transmission at any time that day. If you have a wireless device, the transmission will be sent automatically. After your physician reviews your transmission, you will receive a postcard with your next transmission date.  Your physician has recommended you make the following change in your medication:  Increase Lasix to 40 mg Daily  Increase Potassium to 20 mEq Daily   If you need a refill on your cardiac medications before your next appointment, please call your pharmacy.  Thank you for choosing [facility]!

## 2016-05-26 NOTE — Progress Notes (Signed)
HPI Mr. Zurn returns today for followup. He is a very pleasant 60 year old man with an ischemic cardiomyopathy, chronic systolic heart failure, ejection fraction 25%, status post ICD implantation. In the interim, he has done well except for a propensity to volume overload. He denies chest pain, shortness of breath, fevers, chills, or syncope. He had one episode of heart failure for which he was treated in the ED (May) but continues to have problems with peripheral edema. He admits to dietary indiscretion with sodium though he is trying to do better.  No Known Allergies   Current Outpatient Prescriptions  Medication Sig Dispense Refill  . acetaminophen (TYLENOL) 500 MG tablet Take 500 mg by mouth every 6 (six) hours as needed for pain.    Marland Kitchen albuterol (PROVENTIL HFA;VENTOLIN HFA) 108 (90 Base) MCG/ACT inhaler Inhale 2 puffs into the lungs every 4 (four) hours as needed. 6.7 g 0  . aspirin 81 MG tablet Take 81 mg by mouth at bedtime.     Marland Kitchen atorvastatin (LIPITOR) 80 MG tablet Take 1 tablet (80 mg total) by mouth daily. 90 tablet 0  . benzonatate (TESSALON) 100 MG capsule Take 1 capsule (100 mg total) by mouth 3 (three) times daily as needed for cough. 15 capsule 0  . bisoprolol (ZEBETA) 10 MG tablet Take 1 tablet (10 mg total) by mouth daily. 90 tablet 1  . dextromethorphan-guaiFENesin (MUCINEX DM) 30-600 MG per 12 hr tablet Take 1 tablet by mouth 2 (two) times daily as needed for cough.    . enalapril (VASOTEC) 20 MG tablet Take 0.5 tablets (10 mg total) by mouth daily. 45 tablet 1  . fish oil-omega-3 fatty acids 1000 MG capsule Take 1 g by mouth 3 (three) times daily.     . furosemide (LASIX) 20 MG tablet Take 1 tablet (20 mg total) by mouth daily. 90 tablet 1  . nitroGLYCERIN (NITROSTAT) 0.4 MG SL tablet Place 1 tablet (0.4 mg total) under the tongue every 5 (five) minutes as needed for chest pain. 25 tablet 3  . omeprazole (PRILOSEC) 20 MG capsule Take 1 capsule (20 mg total) by mouth daily. 90  capsule 0  . potassium chloride (KLOR-CON M10) 10 MEQ tablet Take 1 tablet (10 mEq total) by mouth daily. 90 tablet 1   No current facility-administered medications for this visit.     Past Medical History  Diagnosis Date  . Left ventricular dysfunction     apical thrombus  . CHF (congestive heart failure) (HCC)     EF 20%  . GI bleed     Mallory-Weiss tear EGD 02/2010  . Small bowel obstruction (HCC) 2007    resolved without surgery  . Ileus (HCC) 2006    required hospitalization  . Hyperlipidemia   . MRSA bacteremia   . Coronary artery disease   . 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 Jude    ROS:   All systems reviewed and negative except as noted in the HPI.   Past Surgical History  Procedure Laterality Date  . Coronary artery bypass graft      6 vessels, 2000  . Insert / replace / remove pacemaker  01/05/10    ICD insertion/St. Jude single chamber  . Basilic vein left arm resection       Family History  Problem Relation Age of Onset  . Heart attack Mother   . Emphysema Father   . Coronary artery disease Father     s/p cabg  Social History   Social History  . Marital Status: Married    Spouse Name: N/A  . Number of Children: 3  . 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 Smoking status: Former Smoker    Start date: 11/28/1967    Quit date: 02/29/1988  . Smokeless tobacco: Never Used  . Alcohol Use: No  . Drug Use: No  . Sexual Activity: No   Other Topics Concern  . Not on file   Social History Narrative   1 son died age 49-Lee's syndrome           BP 108/62 mmHg  Pulse 54  Ht 5\' 11"  (1.803 m)  Wt 189 lb (85.73 kg)  BMI 26.37 kg/m2  SpO2 94%  Physical Exam:  Well appearing middle-aged man,NAD HEENT: Unremarkable Neck:  7 cm JVD, no thyromegally Back:  No CVA tenderness Lungs:  Clear with no wheezes,  rales, or rhonchi. HEART:  Regular rate rhythm, no murmurs, no rubs, no clicks Abd:  soft, positive bowel sounds, no organomegally, no rebound, no guarding Ext:  2 plus pulses, 2+ peripheral edema, no cyanosis, no clubbing Skin:  No rashes no nodules Neuro:  CN II through XII intact, motor grossly intact  DEVICE  Normal device function.  See PaceArt for details.   Assess/Plan:  1. Acute on chronic systolic heart failure - his volume status is still not too good. I have asked him to increase his lasix to 40 mg daily. He will increase his potassium to 20 meq and he will return for labs in 2 weeks.  2. ICD - his St. Jude device is working normally. Will recheck in several months. 3. CAD - he denies anginal symptoms. Will follow.  Leonia Reeves.D.

## 2016-05-26 NOTE — Addendum Note (Signed)
Addended by: Kerney Elbe on: 05/26/2016 12:46 PM   Modules accepted: Orders

## 2016-05-29 ENCOUNTER — Telehealth: Payer: Self-pay | Admitting: Internal Medicine

## 2016-05-29 NOTE — Telephone Encounter (Signed)
Patient's Lasix dosage was increased Friday. States his ankles are still swollen. / tg

## 2016-06-01 NOTE — Telephone Encounter (Signed)
Spoke with pt, when he was seen by dr Ladona Ridgel 05/26/16, his furosemide was increased to 40 mg daily due to edema. He called to report he cont to have edema. His weight is stable and is usually 183/184. His edema is improved in the morning but by the end of the day his shoes are tight. He denies SOB or orthopnea. Salt and fluid restrictions discussed. Encouraged pt to elevate legs when sitting. Advised pt compression hose can help with the veins in his legs. Will forward for dr hochrein's review

## 2016-06-02 NOTE — Telephone Encounter (Signed)
Advised patient to take Lasix 40 mg BID x 3 days, along w/ extra potassium dosage.  Then return to normal daily dosing of 40 mg daily. Advised to call office if no improvement after taking extra medication. Patient verbalized understanding and agreeable to plan.

## 2016-06-09 DIAGNOSIS — I5022 Chronic systolic (congestive) heart failure: Secondary | ICD-10-CM | POA: Diagnosis not present

## 2016-06-09 DIAGNOSIS — I255 Ischemic cardiomyopathy: Secondary | ICD-10-CM | POA: Diagnosis not present

## 2016-06-10 LAB — BASIC METABOLIC PANEL
BUN: 15 mg/dL (ref 7–25)
CALCIUM: 8.6 mg/dL (ref 8.6–10.3)
CO2: 26 mmol/L (ref 20–31)
Chloride: 104 mmol/L (ref 98–110)
Creat: 1.19 mg/dL (ref 0.70–1.25)
GLUCOSE: 95 mg/dL (ref 65–99)
POTASSIUM: 4.7 mmol/L (ref 3.5–5.3)
SODIUM: 136 mmol/L (ref 135–146)

## 2016-07-10 DIAGNOSIS — E784 Other hyperlipidemia: Secondary | ICD-10-CM | POA: Diagnosis not present

## 2016-07-10 DIAGNOSIS — R7309 Other abnormal glucose: Secondary | ICD-10-CM | POA: Diagnosis not present

## 2016-07-11 DIAGNOSIS — Z125 Encounter for screening for malignant neoplasm of prostate: Secondary | ICD-10-CM | POA: Diagnosis not present

## 2016-07-11 DIAGNOSIS — I10 Essential (primary) hypertension: Secondary | ICD-10-CM | POA: Diagnosis not present

## 2016-07-11 DIAGNOSIS — Z0000 Encounter for general adult medical examination without abnormal findings: Secondary | ICD-10-CM | POA: Diagnosis not present

## 2016-07-11 DIAGNOSIS — E785 Hyperlipidemia, unspecified: Secondary | ICD-10-CM | POA: Diagnosis not present

## 2016-07-11 DIAGNOSIS — I429 Cardiomyopathy, unspecified: Secondary | ICD-10-CM | POA: Diagnosis not present

## 2016-07-11 DIAGNOSIS — I110 Hypertensive heart disease with heart failure: Secondary | ICD-10-CM | POA: Diagnosis not present

## 2016-07-11 DIAGNOSIS — K219 Gastro-esophageal reflux disease without esophagitis: Secondary | ICD-10-CM | POA: Diagnosis not present

## 2016-07-20 ENCOUNTER — Encounter: Payer: Self-pay | Admitting: Cardiology

## 2016-07-31 NOTE — Progress Notes (Signed)
HPI The patient returns for followup of coronary disease and cardiomyopathy. He was in the hospital in May. He did have a 5 pound weight gain suddenly that day.  He did not recall any change in his diet or reason for this sudden change.  In the ED his BNP was slightly elevated.  He was treated with albuterol and sent home on a steroid taper. He was treated with IV Lasix as well. His chest x-ray did suggest some edema. He improved back to baseline with this and his weight went down.  He continued to have problems with edema and weight gain and has had his Lasix dose increased over the phone for intervals since I last saw him.  However, he says that this has resolved.  He rarely has to take an extra Lasix if he has increasing shortness of breath or edema. He does have some chronic lower extremity swelling but this is not nearly as much as it was before. He's watching salt and fluid. He denies any chest pressure, neck or arm discomfort. He's had no palpitations, presyncope. He's had no PND or orthopnea.    No Known Allergies  Current Outpatient Prescriptions  Medication Sig Dispense Refill  . acetaminophen (TYLENOL) 500 MG tablet Take 500 mg by mouth every 6 (six) hours as needed for pain.    Marland Kitchen. albuterol (PROVENTIL HFA;VENTOLIN HFA) 108 (90 Base) MCG/ACT inhaler Inhale 2 puffs into the lungs every 4 (four) hours as needed. 6.7 g 0  . aspirin 81 MG tablet Take 81 mg by mouth at bedtime.     Marland Kitchen. atorvastatin (LIPITOR) 80 MG tablet Take 1 tablet (80 mg total) by mouth daily. 90 tablet 0  . bisoprolol (ZEBETA) 10 MG tablet Take 1 tablet (10 mg total) by mouth daily. 90 tablet 1  . enalapril (VASOTEC) 20 MG tablet Take 0.5 tablets (10 mg total) by mouth daily. 45 tablet 1  . fish oil-omega-3 fatty acids 1000 MG capsule Take 1 g by mouth 3 (three) times daily.     . furosemide (LASIX) 40 MG tablet Take 1 tablet (40 mg total) by mouth daily. 90 tablet 3  . nitroGLYCERIN (NITROSTAT) 0.4 MG SL tablet Place 1  tablet (0.4 mg total) under the tongue every 5 (five) minutes as needed for chest pain. 25 tablet 3  . omeprazole (PRILOSEC) 20 MG capsule Take 1 capsule (20 mg total) by mouth daily. 90 capsule 0  . potassium chloride SA (K-DUR,KLOR-CON) 20 MEQ tablet Take 1 tablet (20 mEq total) by mouth daily. 90 tablet 3  . benzonatate (TESSALON) 100 MG capsule Take 1 capsule (100 mg total) by mouth 3 (three) times daily as needed for cough. (Patient not taking: Reported on 08/02/2016) 15 capsule 0  . dextromethorphan-guaiFENesin (MUCINEX DM) 30-600 MG per 12 hr tablet Take 1 tablet by mouth 2 (two) times daily as needed for cough.     No current facility-administered medications for this visit.     Past Medical History:  Diagnosis Date  . CHF (congestive heart failure) (HCC)    EF 20%  . Coronary artery disease   . GI bleed    Mallory-Weiss tear EGD 02/2010  . Hyperlipidemia   . Ileus (HCC) 2006   required hospitalization  . Left ventricular dysfunction    apical thrombus  . MRSA bacteremia   . 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 Jude  . Small bowel obstruction (HCC) 2007   resolved without surgery  Past Surgical History:  Procedure Laterality Date  . basilic vein left arm resection    . CORONARY ARTERY BYPASS GRAFT     6 vessels, 2000  . INSERT / REPLACE / REMOVE PACEMAKER  01/05/10   ICD insertion/St. Jude single chamber    ROS    Otherwise stated in the HPI and negative for all other systems.   PHYSICAL EXAM BP 110/60   Pulse 64   Ht 5\' 11"  (1.803 m)   Wt 190 lb (86.2 kg)   BMI 26.50 kg/m  GENERAL:  Well appearing NECK:  No jugular venous distention, waveform within normal limits, carotid upstroke brisk and symmetric, no bruits, no thyromegaly LYMPHATICS:  No cervical, inguinal adenopathy LUNGS:  Clear to auscultation bilaterally BACK:  No CVA tenderness CHEST:  Well healed sternotomy scar, ICD scar HEART:  PMI not displaced  or sustained,S1 and S2 within normal limits, no S3, no S4, no clicks, no rubs, no murmurs ABD:  Flat, positive bowel sounds normal in frequency in pitch, no bruits, no rebound, no guarding, no midline pulsatile mass, no hepatomegaly, no splenomegaly EXT:  2 plus pulses throughout, mild/mod edema, no cyanosis no clubbing  EKG:  Sinus rhythm, rate 66, axis within normal limits, intervals within normal limits, no acute ST-T wave changes. 08/02/2016   ASSESSMENT AND PLAN  Coronary artery disease -  The patient has no new sypmtoms. No further cardiovascular testing is indicated. We will continue with aggressive risk reduction and meds as listed.  CARDIOMYOPATHY -  [name] seems to be euvolemic. At this point, no change in therapy is indicated.     ICD - He is up-to-date with followup.  I reviewed the most recent ICD transmission.  DYSLIPIDEMIA - This is followed by Josue Hector, MD

## 2016-08-02 ENCOUNTER — Ambulatory Visit (INDEPENDENT_AMBULATORY_CARE_PROVIDER_SITE_OTHER): Payer: Medicare HMO | Admitting: Cardiology

## 2016-08-02 ENCOUNTER — Encounter: Payer: Self-pay | Admitting: Cardiology

## 2016-08-02 VITALS — BP 110/60 | HR 64 | Ht 71.0 in | Wt 190.0 lb

## 2016-08-02 DIAGNOSIS — I2510 Atherosclerotic heart disease of native coronary artery without angina pectoris: Secondary | ICD-10-CM | POA: Diagnosis not present

## 2016-08-02 DIAGNOSIS — I10 Essential (primary) hypertension: Secondary | ICD-10-CM | POA: Diagnosis not present

## 2016-08-02 DIAGNOSIS — I255 Ischemic cardiomyopathy: Secondary | ICD-10-CM | POA: Diagnosis not present

## 2016-08-02 NOTE — Patient Instructions (Signed)
Medication Instructions:  The current medical regimen is effective;  continue present plan and medications.  Follow-Up: Follow up in 6 months with Dr. Hochrein.  You will receive a letter in the mail 2 months before you are due.  Please call us when you receive this letter to schedule your follow up appointment.  If you need a refill on your cardiac medications before your next appointment, please call your pharmacy.  Thank you for choosing [facility]!!       

## 2016-08-28 ENCOUNTER — Telehealth: Payer: Self-pay | Admitting: Cardiology

## 2016-08-28 ENCOUNTER — Encounter: Payer: Medicare HMO | Admitting: *Deleted

## 2016-08-28 NOTE — Telephone Encounter (Signed)
Spoke with pt and reminded pt of remote transmission that is due today. Pt verbalized understanding.   

## 2016-09-01 ENCOUNTER — Encounter: Payer: Self-pay | Admitting: Cardiology

## 2016-09-06 ENCOUNTER — Telehealth: Payer: Self-pay | Admitting: Cardiology

## 2016-09-06 NOTE — Telephone Encounter (Signed)
Attempted to call pt and request a manual transmission b/c his home monitor has not updated in at least 8 days. No answer and unable to leave a message.  

## 2016-09-07 ENCOUNTER — Other Ambulatory Visit: Payer: Self-pay | Admitting: Cardiology

## 2016-09-07 NOTE — Telephone Encounter (Signed)
Rx request sent to pharmacy.  

## 2016-09-27 ENCOUNTER — Telehealth: Payer: Self-pay | Admitting: Cardiology

## 2016-09-27 ENCOUNTER — Ambulatory Visit (INDEPENDENT_AMBULATORY_CARE_PROVIDER_SITE_OTHER): Payer: Medicare HMO | Admitting: *Deleted

## 2016-09-27 DIAGNOSIS — I5022 Chronic systolic (congestive) heart failure: Secondary | ICD-10-CM

## 2016-09-27 DIAGNOSIS — I255 Ischemic cardiomyopathy: Secondary | ICD-10-CM

## 2016-09-27 NOTE — Telephone Encounter (Signed)
Called to remind pt him of remote transmission. Spoke w/ pt wife. She informed me that pt was not home today and he has an appt on 09-28-16 to have device checked in the [facility] office. Verified in epic.

## 2016-09-28 ENCOUNTER — Ambulatory Visit (INDEPENDENT_AMBULATORY_CARE_PROVIDER_SITE_OTHER): Payer: Medicare HMO | Admitting: *Deleted

## 2016-09-28 DIAGNOSIS — I5022 Chronic systolic (congestive) heart failure: Secondary | ICD-10-CM | POA: Diagnosis not present

## 2016-09-28 DIAGNOSIS — I255 Ischemic cardiomyopathy: Secondary | ICD-10-CM | POA: Diagnosis not present

## 2016-09-28 LAB — CUP PACEART INCLINIC DEVICE CHECK
Battery Remaining Longevity: 55 mo
Date Time Interrogation Session: 20171102143821
HighPow Impedance: 44.645
Implantable Lead Implant Date: 20110209
Implantable Pulse Generator Implant Date: 20110209
Lead Channel Impedance Value: 437.5 Ohm
Lead Channel Setting Pacing Pulse Width: 0.4 ms
MDC IDC LEAD LOCATION: 753860
MDC IDC MSMT LEADCHNL RV PACING THRESHOLD AMPLITUDE: 0.75 V
MDC IDC MSMT LEADCHNL RV PACING THRESHOLD AMPLITUDE: 0.75 V
MDC IDC MSMT LEADCHNL RV PACING THRESHOLD PULSEWIDTH: 0.4 ms
MDC IDC MSMT LEADCHNL RV PACING THRESHOLD PULSEWIDTH: 0.4 ms
MDC IDC MSMT LEADCHNL RV SENSING INTR AMPL: 12 mV
MDC IDC PG SERIAL: 747960
MDC IDC SET LEADCHNL RV PACING AMPLITUDE: 2.5 V
MDC IDC SET LEADCHNL RV SENSING SENSITIVITY: 0.5 mV
MDC IDC STAT BRADY RV PERCENT PACED: 0.01 %

## 2016-09-28 NOTE — Progress Notes (Signed)
ICD check in clinic. Normal device function. Threshold and sensing consistent with previous device measurements. Impedance trends stable over time. No evidence of any ventricular arrhythmias. Histogram distribution appropriate for patient and level of activity. Stable thoracic impedance. No changes made this session. Device programmed at appropriate safety margins. Device programmed to optimize intrinsic conduction. Estimated longevity 4.6 years. Pt will follow up via [facility] on 2/1. Vibration demonstrated for patient.

## 2016-09-28 NOTE — Progress Notes (Signed)
Remote ICD transmission.  Pt seen in office on 09-28-16

## 2016-09-29 ENCOUNTER — Encounter: Payer: Self-pay | Admitting: Cardiology

## 2016-10-05 ENCOUNTER — Other Ambulatory Visit: Payer: Self-pay | Admitting: Cardiology

## 2016-11-06 DIAGNOSIS — E785 Hyperlipidemia, unspecified: Secondary | ICD-10-CM | POA: Diagnosis not present

## 2016-11-06 DIAGNOSIS — I10 Essential (primary) hypertension: Secondary | ICD-10-CM | POA: Diagnosis not present

## 2016-11-06 DIAGNOSIS — R7309 Other abnormal glucose: Secondary | ICD-10-CM | POA: Diagnosis not present

## 2016-11-07 DIAGNOSIS — E785 Hyperlipidemia, unspecified: Secondary | ICD-10-CM | POA: Diagnosis not present

## 2016-11-07 DIAGNOSIS — I2510 Atherosclerotic heart disease of native coronary artery without angina pectoris: Secondary | ICD-10-CM | POA: Diagnosis not present

## 2016-11-07 DIAGNOSIS — Z23 Encounter for immunization: Secondary | ICD-10-CM | POA: Diagnosis not present

## 2016-11-07 DIAGNOSIS — I10 Essential (primary) hypertension: Secondary | ICD-10-CM | POA: Diagnosis not present

## 2016-11-07 DIAGNOSIS — I2583 Coronary atherosclerosis due to lipid rich plaque: Secondary | ICD-10-CM | POA: Diagnosis not present

## 2016-11-07 DIAGNOSIS — K219 Gastro-esophageal reflux disease without esophagitis: Secondary | ICD-10-CM | POA: Diagnosis not present

## 2016-11-14 ENCOUNTER — Other Ambulatory Visit: Payer: Self-pay | Admitting: Cardiology

## 2016-11-28 ENCOUNTER — Telehealth: Payer: Self-pay | Admitting: Cardiology

## 2016-11-28 NOTE — Telephone Encounter (Signed)
enalapril (VASOTEC) 20 MG tablet 45 tablet 1 11/14/2016    Sig: TAKE 1/2 TABLETS (10 MG TOTAL) BY MOUTH DAILY.   E-Prescribing Status: Receipt confirmed by pharmacy (11/14/2016 11:41 AM EST)   Pharmacy   [facility] [phone] - [place],  - [address]   Called pharmacy staff. Patient has not submitted refill request to them but they will go ahead and get Rx ready.

## 2016-11-28 NOTE — Telephone Encounter (Signed)
°*  STAT* If patient is at the pharmacy, call can be transferred to refill team.   1. Which medications need to be refilled? (please list name of each medication and dose if known) Enalapril  2. Which pharmacy/location (including street and city if local pharmacy) is medication to be sent to? [facility] [place] 3. Do they need a 30 day or 90 day supply? 90 day supply

## 2016-12-28 ENCOUNTER — Encounter: Payer: Medicare HMO | Admitting: *Deleted

## 2016-12-28 ENCOUNTER — Telehealth: Payer: Self-pay | Admitting: Cardiology

## 2016-12-28 NOTE — Telephone Encounter (Signed)
Spoke with pt and reminded pt of remote transmission that is due today. Pt verbalized understanding.   

## 2017-01-19 ENCOUNTER — Encounter: Payer: Self-pay | Admitting: Cardiology

## 2017-01-19 ENCOUNTER — Ambulatory Visit (INDEPENDENT_AMBULATORY_CARE_PROVIDER_SITE_OTHER): Payer: Medicare HMO | Admitting: *Deleted

## 2017-01-19 DIAGNOSIS — I255 Ischemic cardiomyopathy: Secondary | ICD-10-CM

## 2017-01-19 DIAGNOSIS — I5022 Chronic systolic (congestive) heart failure: Secondary | ICD-10-CM

## 2017-01-22 ENCOUNTER — Other Ambulatory Visit: Payer: Self-pay | Admitting: Cardiology

## 2017-01-23 ENCOUNTER — Encounter: Payer: Self-pay | Admitting: Cardiology

## 2017-01-23 NOTE — Progress Notes (Signed)
Remote ICD transmission.   

## 2017-02-12 LAB — CUP PACEART REMOTE DEVICE CHECK
Battery Remaining Longevity: 53 mo
Battery Remaining Percentage: 44 %
Battery Voltage: 2.93 V
Brady Statistic RV Percent Paced: 1 %
Date Time Interrogation Session: 20180224042500
HIGH POWER IMPEDANCE MEASURED VALUE: 46 Ohm
Implantable Lead Implant Date: 20110209
Implantable Lead Location: 753860
Implantable Lead Model: 7120
Lead Channel Pacing Threshold Amplitude: 0.75 V
Lead Channel Pacing Threshold Pulse Width: 0.4 ms
Lead Channel Setting Pacing Pulse Width: 0.4 ms
Lead Channel Setting Sensing Sensitivity: 0.5 mV
MDC IDC MSMT LEADCHNL RV IMPEDANCE VALUE: 390 Ohm
MDC IDC MSMT LEADCHNL RV SENSING INTR AMPL: 12 mV
MDC IDC PG IMPLANT DT: 20110209
MDC IDC SET LEADCHNL RV PACING AMPLITUDE: 2.5 V
Pulse Gen Serial Number: 747960

## 2017-03-02 ENCOUNTER — Other Ambulatory Visit: Payer: Self-pay | Admitting: Cardiology

## 2017-03-02 NOTE — Telephone Encounter (Signed)
REFILL 

## 2017-03-08 DIAGNOSIS — Z1159 Encounter for screening for other viral diseases: Secondary | ICD-10-CM | POA: Diagnosis not present

## 2017-03-08 DIAGNOSIS — I429 Cardiomyopathy, unspecified: Secondary | ICD-10-CM | POA: Diagnosis not present

## 2017-03-08 DIAGNOSIS — I2510 Atherosclerotic heart disease of native coronary artery without angina pectoris: Secondary | ICD-10-CM | POA: Diagnosis not present

## 2017-03-08 DIAGNOSIS — K219 Gastro-esophageal reflux disease without esophagitis: Secondary | ICD-10-CM | POA: Diagnosis not present

## 2017-03-08 DIAGNOSIS — I10 Essential (primary) hypertension: Secondary | ICD-10-CM | POA: Diagnosis not present

## 2017-03-21 DIAGNOSIS — J209 Acute bronchitis, unspecified: Secondary | ICD-10-CM | POA: Diagnosis not present

## 2017-03-21 DIAGNOSIS — I10 Essential (primary) hypertension: Secondary | ICD-10-CM | POA: Diagnosis not present

## 2017-03-21 DIAGNOSIS — J0100 Acute maxillary sinusitis, unspecified: Secondary | ICD-10-CM | POA: Diagnosis not present

## 2017-03-21 DIAGNOSIS — I2510 Atherosclerotic heart disease of native coronary artery without angina pectoris: Secondary | ICD-10-CM | POA: Diagnosis not present

## 2017-04-02 ENCOUNTER — Other Ambulatory Visit: Payer: Self-pay

## 2017-04-02 ENCOUNTER — Telehealth: Payer: Self-pay | Admitting: *Deleted

## 2017-04-02 ENCOUNTER — Ambulatory Visit (INDEPENDENT_AMBULATORY_CARE_PROVIDER_SITE_OTHER): Payer: Medicare HMO | Admitting: Internal Medicine

## 2017-04-02 ENCOUNTER — Encounter: Payer: Self-pay | Admitting: Internal Medicine

## 2017-04-02 VITALS — BP 104/64 | HR 69 | Ht 71.0 in | Wt 183.0 lb

## 2017-04-02 DIAGNOSIS — Z95810 Presence of automatic (implantable) cardiac defibrillator: Secondary | ICD-10-CM

## 2017-04-02 DIAGNOSIS — I2510 Atherosclerotic heart disease of native coronary artery without angina pectoris: Secondary | ICD-10-CM

## 2017-04-02 DIAGNOSIS — I5022 Chronic systolic (congestive) heart failure: Secondary | ICD-10-CM

## 2017-04-02 NOTE — Patient Instructions (Signed)

## 2017-04-02 NOTE — Telephone Encounter (Signed)
Try to called Joseph [name] about the medications changes he requested from his pharmacy, unable to leave message on voicemail, to let him know Dr hochrein will switch his Bisoprolol to Metoprolol 100 mg daily.

## 2017-04-02 NOTE — Progress Notes (Signed)
HPI [name] returns today for followup. He is a very pleasant 61 year old man with an ischemic cardiomyopathy, chronic systolic heart failure, ejection fraction 25%, status post ICD implantation. In the interim, he has done well except for a propensity to volume overload. He denies chest pain, shortness of breath, fevers, chills, or syncope. He admits to dietary indiscretion with sodium though he is trying to do better. He has had a single ICD shock.   No Known Allergies   Current Outpatient Prescriptions  Medication Sig Dispense Refill  . acetaminophen (TYLENOL) 500 MG tablet Take 500 mg by mouth every 6 (six) hours as needed for pain.    Marland Kitchen albuterol (PROVENTIL HFA;VENTOLIN HFA) 108 (90 Base) MCG/ACT inhaler Inhale 2 puffs into the lungs every 4 (four) hours as needed. 6.7 g 0  . aspirin 81 MG tablet Take 81 mg by mouth at bedtime.     Marland Kitchen atorvastatin (LIPITOR) 80 MG tablet TAKE 1 TABLET (80 MG TOTAL) BY MOUTH DAILY. 90 tablet 1  . benzonatate (TESSALON) 100 MG capsule Take 1 capsule (100 mg total) by mouth 3 (three) times daily as needed for cough. 15 capsule 0  . bisoprolol (ZEBETA) 10 MG tablet TAKE 1 TABLET (10 MG TOTAL) BY MOUTH DAILY. 90 tablet 1  . dextromethorphan-guaiFENesin (MUCINEX DM) 30-600 MG per 12 hr tablet Take 1 tablet by mouth 2 (two) times daily as needed for cough.    . enalapril (VASOTEC) 20 MG tablet TAKE 1/2 TABLETS (10 MG TOTAL) BY MOUTH DAILY. 45 tablet 1  . fish oil-omega-3 fatty acids 1000 MG capsule Take 1 g by mouth 3 (three) times daily.     . furosemide (LASIX) 40 MG tablet Take 1 tablet (40 mg total) by mouth daily. 90 tablet 3  . nitroGLYCERIN (NITROSTAT) 0.4 MG SL tablet Place 1 tablet (0.4 mg total) under the tongue every 5 (five) minutes as needed for chest pain. 25 tablet 3  . omeprazole (PRILOSEC) 20 MG capsule Take 1 capsule (20 mg total) by mouth daily. 90 capsule 2  . potassium chloride SA (K-DUR,KLOR-CON) 20 MEQ tablet Take 1 tablet (20 mEq total) by  mouth daily. 90 tablet 3   No current facility-administered medications for this visit.      Past Medical History:  Diagnosis Date  . CHF (congestive heart failure) (HCC)    EF 20%  . Coronary artery disease   . GI bleed    Mallory-Weiss tear EGD 02/2010  . Hyperlipidemia   . Ileus (HCC) 2006   required hospitalization  . Left ventricular dysfunction    apical thrombus  . MRSA bacteremia   . 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 Jude  . Small bowel obstruction (HCC) 2007   resolved without surgery    ROS:   All systems reviewed and negative except as noted in the HPI.   Past Surgical History:  Procedure Laterality Date  . basilic vein left arm resection    . CORONARY ARTERY BYPASS GRAFT     6 vessels, 2000  . INSERT / REPLACE / REMOVE PACEMAKER  01/05/10   ICD insertion/St. Jude single chamber     Family History  Problem Relation Age of Onset  . Heart attack Mother   . Emphysema Father   . Coronary artery disease Father     s/p cabg     Social History   Social History  . Marital status: Married    Spouse name: N/A  . Number  of children: 3  . 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 Smoking status: Former Smoker    Start date: 11/28/1967    Quit date: 02/29/1988  . Smokeless tobacco: Never Used  . Alcohol use No  . Drug use: No  . Sexual activity: No   Other Topics Concern  . Not on file   Social History Narrative   1 son died age 93-Lee's syndrome           BP 104/64   Pulse 69   Ht 5\' 11"  (1.803 m)   Wt 183 lb (83 kg)   SpO2 96%   BMI 25.52 kg/m   Physical Exam:  Well appearing middle-aged man,NAD HEENT: Unremarkable Neck:  6 cm JVD, no thyromegally Back:  No CVA tenderness Lungs:  Clear with no wheezes, rales, or rhonchi. HEART:  Regular rate rhythm, no murmurs, no rubs, no clicks Abd:  soft, positive bowel  sounds, no organomegally, no rebound, no guarding Ext:  2 plus pulses, 1+ peripheral edema, no cyanosis, no clubbing Skin:  No rashes no nodules Neuro:  CN II through XII intact, motor grossly intact  DEVICE  Normal device function.  See PaceArt for details.   Assess/Plan:  1.  chronic systolic heart failure - his volume status is better and I have encouraged him to avoid salty food. He will continue lasix 40 mg daily. He will continue his potassium at 20 meq daily 2. ICD - his St. Jude device is working normally. Will recheck in several months. 3. CAD - he denies anginal symptoms. Will follow. 4. Essential HTN - his blood pressure is good today. No change in his meds.  Leonia Reeves.D.

## 2017-04-06 LAB — CUP PACEART INCLINIC DEVICE CHECK
Brady Statistic RV Percent Paced: 0 %
HighPow Impedance: 48.0938
Implantable Lead Implant Date: 20110209
Implantable Pulse Generator Implant Date: 20110209
Lead Channel Impedance Value: 437.5 Ohm
Lead Channel Pacing Threshold Pulse Width: 0.4 ms
Lead Channel Sensing Intrinsic Amplitude: 12 mV
Lead Channel Setting Pacing Pulse Width: 0.4 ms
Lead Channel Setting Sensing Sensitivity: 0.5 mV
MDC IDC LEAD LOCATION: 753860
MDC IDC MSMT LEADCHNL RV PACING THRESHOLD AMPLITUDE: 0.75 V
MDC IDC SESS DTM: 20180507194449
MDC IDC SET LEADCHNL RV PACING AMPLITUDE: 2.5 V
Pulse Gen Serial Number: 747960

## 2017-04-10 NOTE — Progress Notes (Signed)
HPI The patient returns for followup of coronary disease and cardiomyopathy.  Since I last saw him he has done well.  He is doing 30 minutes of walking QOD.  The patient denies any new symptoms such as chest discomfort, neck or arm discomfort. There has been no new shortness of breath, PND or orthopnea. There have been no reported palpitations, presyncope or syncope.  He would like to switch beta blockers for cost.      No Known Allergies  Current Outpatient Prescriptions  Medication Sig Dispense Refill  . acetaminophen (TYLENOL) 500 MG tablet Take 500 mg by mouth every 6 (six) hours as needed for pain.    Marland Kitchen albuterol (PROVENTIL HFA;VENTOLIN HFA) 108 (90 Base) MCG/ACT inhaler Inhale 2 puffs into the lungs every 4 (four) hours as needed. 6.7 g 0  . aspirin 81 MG tablet Take 81 mg by mouth at bedtime.     Marland Kitchen atorvastatin (LIPITOR) 80 MG tablet TAKE 1 TABLET (80 MG TOTAL) BY MOUTH DAILY. 90 tablet 1  . enalapril (VASOTEC) 20 MG tablet TAKE 1/2 TABLETS (10 MG TOTAL) BY MOUTH DAILY. 45 tablet 1  . fish oil-omega-3 fatty acids 1000 MG capsule Take 1 g by mouth 3 (three) times daily.     . furosemide (LASIX) 40 MG tablet Take 1 tablet (40 mg total) by mouth daily. 90 tablet 3  . nitroGLYCERIN (NITROSTAT) 0.4 MG SL tablet Place 1 tablet (0.4 mg total) under the tongue every 5 (five) minutes as needed for chest pain. 25 tablet 3  . omeprazole (PRILOSEC) 20 MG capsule Take 1 capsule (20 mg total) by mouth daily. 90 capsule 2  . potassium chloride SA (K-DUR,KLOR-CON) 20 MEQ tablet Take 1 tablet (20 mEq total) by mouth daily. 90 tablet 3  . benzonatate (TESSALON) 100 MG capsule Take 1 capsule (100 mg total) by mouth 3 (three) times daily as needed for cough. (Patient not taking: Reported on 04/11/2017) 15 capsule 0  . dextromethorphan-guaiFENesin (MUCINEX DM) 30-600 MG per 12 hr tablet Take 1 tablet by mouth 2 (two) times daily as needed for cough.    . metoprolol succinate (TOPROL-XL) 100 MG 24 hr  tablet Take 1 tablet (100 mg total) by mouth daily. Take with or immediately following a meal. With a 50 mg tablet for a total of 150 mg a day 90 tablet 3  . metoprolol succinate (TOPROL-XL) 50 MG 24 hr tablet Take 1 tablet (50 mg total) by mouth daily. Take with or immediately following a meal. With 100 mg tab for a total of 150 mg a day 90 tablet 3   No current facility-administered medications for this visit.     Past Medical History:  Diagnosis Date  . CHF (congestive heart failure) (HCC)    EF 20%  . Coronary artery disease   . GI bleed    Mallory-Weiss tear EGD 02/2010  . Hyperlipidemia   . Ileus (HCC) 2006   required hospitalization  . Left ventricular dysfunction    apical thrombus  . MRSA bacteremia   . 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 Jude  . Small bowel obstruction (HCC) 2007   resolved without surgery    Past Surgical History:  Procedure Laterality Date  . basilic vein left arm resection    . CORONARY ARTERY BYPASS GRAFT     6 vessels, 2000  . INSERT / REPLACE / REMOVE PACEMAKER  01/05/10   ICD insertion/St. Jude single chamber  ROS   Sinus trouble recently  Otherwise stated in the HPI and negative for all other systems.   PHYSICAL EXAM BP 116/78   Pulse 64   Ht 5\' 11"  (1.803 m)   Wt 187 lb (84.8 kg)   BMI 26.08 kg/m   GENERAL:  Well appearing NECK:  No jugular venous distention, waveform within normal limits, carotid upstroke brisk and symmetric, no bruits, no thyromegaly LUNGS:  Clear to auscultation bilaterally CHEST:  Unremarkable, well healed ICD scar HEART:  PMI not displaced or sustained,S1 and S2 within normal limits, no S3, no S4, no clicks, no rubs, no murmurs ABD:  Flat, positive bowel sounds normal in frequency in pitch, no bruits, no rebound, no guarding, no midline pulsatile mass, no hepatomegaly, no splenomegaly EXT:  2 plus pulses throughout, no edema, no cyanosis no clubbing   EKG:   Sinus rhythm, rate 63, axis within normal limits, intervals within normal limits, no acute ST-T wave changes. 04/11/2017   ASSESSMENT AND PLAN  Coronary artery disease -  The patient has no new sypmtoms.  No further cardiovascular testing is indicated.  We will continue with aggressive risk reduction and meds as listed.  CARDIOMYOPATHY -  Mr. Binner seems to be euvolemic. I will change to Toprol XL 150 mg and stop the bisoprolol.  This is for cost.    ICD - He is up-to-date with followup.  I reviewed the most recent ICD transmission from 04/02/17.   DYSLIPIDEMIA - LDL was 64 and HDL was 46  This is followed by Joette Catching, MD.  No change in therapy.

## 2017-04-11 ENCOUNTER — Encounter: Payer: Self-pay | Admitting: Cardiology

## 2017-04-11 ENCOUNTER — Ambulatory Visit (INDEPENDENT_AMBULATORY_CARE_PROVIDER_SITE_OTHER): Payer: Medicare HMO | Admitting: Cardiology

## 2017-04-11 ENCOUNTER — Telehealth: Payer: Self-pay | Admitting: *Deleted

## 2017-04-11 VITALS — BP 116/78 | HR 64 | Ht 71.0 in | Wt 187.0 lb

## 2017-04-11 DIAGNOSIS — I5022 Chronic systolic (congestive) heart failure: Secondary | ICD-10-CM

## 2017-04-11 DIAGNOSIS — Z95810 Presence of automatic (implantable) cardiac defibrillator: Secondary | ICD-10-CM

## 2017-04-11 DIAGNOSIS — I2510 Atherosclerotic heart disease of native coronary artery without angina pectoris: Secondary | ICD-10-CM

## 2017-04-11 DIAGNOSIS — I255 Ischemic cardiomyopathy: Secondary | ICD-10-CM

## 2017-04-11 DIAGNOSIS — I10 Essential (primary) hypertension: Secondary | ICD-10-CM

## 2017-04-11 MED ORDER — METOPROLOL SUCCINATE ER 50 MG PO TB24: 50.0000 mg | ORAL_TABLET | Freq: Every day | ORAL | 3 refills | Status: DC

## 2017-04-11 MED ORDER — METOPROLOL TARTRATE 50 MG PO TABS: 50.0000 mg | ORAL_TABLET | Freq: Three times a day (TID) | ORAL | 3 refills | Status: DC

## 2017-04-11 MED ORDER — METOPROLOL SUCCINATE ER 100 MG PO TB24: 100.0000 mg | ORAL_TABLET | Freq: Every day | ORAL | 3 refills | Status: DC

## 2017-04-11 NOTE — Telephone Encounter (Signed)
Pharmacy called back after RX for Metoprolol succinate was sent in electronically stating only the tartrate is free for pt.  Reviewed with Dr Antoine Poche who gave orders for pt to take tartrate 50 mg TID.  # [phone] X 3 refills verbally called into pharmacy.

## 2017-04-11 NOTE — Patient Instructions (Addendum)
Medication Instructions:  Please stop your Bisoprolol. Start Metoprolol succinate 100 mg and 50 mg tablet daily. Continue all other medications as listed.  Follow-Up: Follow up in 4 months with Dr Antoine Poche in [place].  If you need a refill on your cardiac medications before your next appointment, please call your pharmacy.  Thank you for choosing [facility]!!

## 2017-04-19 DIAGNOSIS — I2510 Atherosclerotic heart disease of native coronary artery without angina pectoris: Secondary | ICD-10-CM | POA: Diagnosis not present

## 2017-04-19 DIAGNOSIS — I10 Essential (primary) hypertension: Secondary | ICD-10-CM | POA: Diagnosis not present

## 2017-04-19 DIAGNOSIS — J209 Acute bronchitis, unspecified: Secondary | ICD-10-CM | POA: Diagnosis not present

## 2017-04-21 ENCOUNTER — Emergency Department (HOSPITAL_COMMUNITY)
Admission: EM | Admit: 2017-04-21 | Discharge: 2017-04-21 | Disposition: A | Discharge status: Home / Self Care | Payer: Medicare HMO | Attending: Emergency Medicine | Admitting: Emergency Medicine

## 2017-04-21 ENCOUNTER — Emergency Department (HOSPITAL_COMMUNITY): Payer: Medicare HMO

## 2017-04-21 ENCOUNTER — Encounter (HOSPITAL_COMMUNITY): Payer: Self-pay | Admitting: *Deleted

## 2017-04-21 DIAGNOSIS — Z87891 Personal history of nicotine dependence: Secondary | ICD-10-CM | POA: Diagnosis not present

## 2017-04-21 DIAGNOSIS — Z7982 Long term (current) use of aspirin: Secondary | ICD-10-CM | POA: Diagnosis not present

## 2017-04-21 DIAGNOSIS — R0789 Other chest pain: Secondary | ICD-10-CM | POA: Diagnosis present

## 2017-04-21 DIAGNOSIS — I5023 Acute on chronic systolic (congestive) heart failure: Secondary | ICD-10-CM | POA: Diagnosis not present

## 2017-04-21 DIAGNOSIS — I2510 Atherosclerotic heart disease of native coronary artery without angina pectoris: Secondary | ICD-10-CM | POA: Diagnosis not present

## 2017-04-21 DIAGNOSIS — J189 Pneumonia, unspecified organism: Secondary | ICD-10-CM | POA: Diagnosis not present

## 2017-04-21 DIAGNOSIS — I110 Hypertensive heart disease with heart failure: Secondary | ICD-10-CM | POA: Diagnosis not present

## 2017-04-21 DIAGNOSIS — R05 Cough: Secondary | ICD-10-CM | POA: Diagnosis not present

## 2017-04-21 DIAGNOSIS — Z79899 Other long term (current) drug therapy: Secondary | ICD-10-CM | POA: Insufficient documentation

## 2017-04-21 LAB — CBC
HCT: 39.4 % (ref 39.0–52.0)
HEMOGLOBIN: 12.9 g/dL — AB (ref 13.0–17.0)
MCH: 27.3 pg (ref 26.0–34.0)
MCHC: 32.7 g/dL (ref 30.0–36.0)
MCV: 83.5 fL (ref 78.0–100.0)
Platelets: 241 10*3/uL (ref 150–400)
RBC: 4.72 MIL/uL (ref 4.22–5.81)
RDW: 14.5 % (ref 11.5–15.5)
WBC: 9.7 10*3/uL (ref 4.0–10.5)

## 2017-04-21 LAB — BRAIN NATRIURETIC PEPTIDE: B Natriuretic Peptide: 353 pg/mL — ABNORMAL HIGH (ref 0.0–100.0)

## 2017-04-21 LAB — BASIC METABOLIC PANEL
ANION GAP: 8 (ref 5–15)
BUN: 20 mg/dL (ref 6–20)
CALCIUM: 8.8 mg/dL — AB (ref 8.9–10.3)
CO2: 25 mmol/L (ref 22–32)
Chloride: 102 mmol/L (ref 101–111)
Creatinine, Ser: 1.3 mg/dL — ABNORMAL HIGH (ref 0.61–1.24)
GFR calc Af Amer: >60 mL/min (ref >60)
GFR, EST NON AFRICAN AMERICAN: 57 mL/min — AB (ref >60)
Glucose, Bld: 108 mg/dL — ABNORMAL HIGH (ref 65–99)
POTASSIUM: 4.2 mmol/L (ref 3.5–5.1)
SODIUM: 135 mmol/L (ref 135–145)

## 2017-04-21 LAB — I-STAT TROPONIN, ED: TROPONIN I, POC: 0 ng/mL (ref 0.00–0.08)

## 2017-04-21 MED ORDER — PREDNISONE 20 MG PO TABS: 40.0000 mg | ORAL_TABLET | Freq: Every day | ORAL | 0 refills | Status: DC

## 2017-04-21 MED ORDER — ALBUTEROL SULFATE (2.5 MG/3ML) 0.083% IN NEBU
5.0000 mg | INHALATION_SOLUTION | Freq: Once | RESPIRATORY_TRACT | Status: AC
  Administered 2017-04-21: 5 mg via RESPIRATORY_TRACT
  Filled 2017-04-21: qty 6

## 2017-04-21 MED ORDER — LEVOFLOXACIN 750 MG PO TABS: 750.0000 mg | ORAL_TABLET | Freq: Every day | ORAL | 0 refills | Status: DC

## 2017-04-21 MED ORDER — LEVOFLOXACIN IN D5W 750 MG/150ML IV SOLN
750.0000 mg | Freq: Once | INTRAVENOUS | Status: AC
  Administered 2017-04-21: 750 mg via INTRAVENOUS
  Filled 2017-04-21: qty 150

## 2017-04-21 MED ORDER — FUROSEMIDE 10 MG/ML IJ SOLN
40.0000 mg | Freq: Once | INTRAMUSCULAR | Status: AC
  Administered 2017-04-21: 40 mg via INTRAVENOUS
  Filled 2017-04-21: qty 4

## 2017-04-21 MED ORDER — METHYLPREDNISOLONE SODIUM SUCC 125 MG IJ SOLR
125.0000 mg | Freq: Once | INTRAMUSCULAR | Status: AC
  Administered 2017-04-21: 125 mg via INTRAVENOUS
  Filled 2017-04-21: qty 2

## 2017-04-21 NOTE — ED Triage Notes (Signed)
Pt c/o soreness to chest and abdomen; pt states he has had a dry cough for the last 3 days

## 2017-04-21 NOTE — ED Provider Notes (Signed)
[facility]Y DEPT Provider Note   CSN: [id number] Arrival date & time: 04/21/17  0234     History   Chief Complaint Chief Complaint  Patient presents with  . Chest Pain    HPI Joseph [name] is a 61 y.o. male.  Patient presents with complaints of chest discomfort and shortness of breath. Patient reports that symptoms have been ongoing for 4 days. He saw his doctor 2 days ago and was given a Z-Pak. He has not had improvement. He has had a cough that has been intermittently productive of sputum. He has not had any fever. Symptoms have been persistent for the entire 4 days without relief.      Past Medical History:  Diagnosis Date  . CHF (congestive heart failure) (HCC)    EF 20%  . Coronary artery disease   . GI bleed    Mallory-Weiss tear EGD 02/2010  . Hyperlipidemia   . Ileus (HCC) 2006   required hospitalization  . Left ventricular dysfunction    apical thrombus  . MRSA bacteremia   . 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 Jude  . Small bowel obstruction (HCC) 2007   resolved without surgery    Patient Active Problem List   Diagnosis Date Noted  . Essential hypertension   . Acute on chronic systolic congestive heart failure (HCC) 01/16/2015  . CHF (congestive heart failure) (HCC) 01/16/2015  . Arteriosclerosis of coronary artery 08/05/2014  . Essential (primary) hypertension 08/05/2014  . Acid reflux 08/05/2014  . Hx of CABG 09/06/2011  . Chronic systolic heart failure (HCC) 05/02/2011  . LAD stenosis   . Coronary artery disease   . BACTEREMIA 03/30/2010  . Automatic implantable cardioverter-defibrillator in situ 01/12/2010  . Other specified forms of chronic ischemic heart disease 11/03/2009  . Cardiomyopathy, ischemic 11/03/2009  . HYPERLIPIDEMIA 02/02/2009    Past Surgical History:  Procedure Laterality Date  . basilic vein left arm resection    . CORONARY ARTERY BYPASS GRAFT     6 vessels, 2000    . INSERT / REPLACE / REMOVE PACEMAKER  01/05/10   ICD insertion/St. Jude single chamber       Home Medications    Prior to Admission medications   Medication Sig Start Date End Date Taking? Authorizing Provider  acetaminophen (TYLENOL) 500 MG tablet Take 500 mg by mouth every 6 (six) hours as needed for pain.    [provider]  albuterol (PROVENTIL HFA;VENTOLIN HFA) 108 (90 Base) MCG/ACT inhaler Inhale 2 puffs into the lungs every 4 (four) hours as needed. 03/27/16   Devoria Albe, MD  aspirin 81 MG tablet Take 81 mg by mouth at bedtime.     [provider]  atorvastatin (LIPITOR) 80 MG tablet TAKE 1 TABLET (80 MG TOTAL) BY MOUTH DAILY. 03/02/17   Rollene Rotunda, MD  benzonatate (TESSALON) 100 MG capsule Take 1 capsule (100 mg total) by mouth 3 (three) times daily as needed for cough. Patient not taking: Reported on 04/11/2017 12/31/15   Samuel Jester, DO  dextromethorphan-guaiFENesin [facility] DM) 30-600 MG per 12 hr tablet Take 1 tablet by mouth 2 (two) times daily as needed for cough.    [provider]  enalapril (VASOTEC) 20 MG tablet TAKE 1/2 TABLETS (10 MG TOTAL) BY MOUTH DAILY. 11/14/16   Rollene Rotunda, MD  fish oil-omega-3 fatty acids 1000 MG capsule Take 1 g by mouth 3 (three) times daily.     [provider]  furosemide (LASIX)  40 MG tablet Take 1 tablet (40 mg total) by mouth daily. 05/26/16   Marinus Maw, MD  levofloxacin (LEVAQUIN) 750 MG tablet Take 1 tablet (750 mg total) by mouth daily. 04/21/17   Gilda Crease, MD  metoprolol tartrate (LOPRESSOR) 50 MG tablet Take 1 tablet (50 mg total) by mouth 3 (three) times daily. 04/11/17 07/10/17  Rollene Rotunda, MD  nitroGLYCERIN (NITROSTAT) 0.4 MG SL tablet Place 1 tablet (0.4 mg total) under the tongue every 5 (five) minutes as needed for chest pain. 05/24/16   Rollene Rotunda, MD  omeprazole (PRILOSEC) 20 MG capsule Take 1 capsule (20 mg total) by mouth daily. 10/05/16   Rollene Rotunda, MD   potassium chloride SA (K-DUR,KLOR-CON) 20 MEQ tablet Take 1 tablet (20 mEq total) by mouth daily. 05/26/16   Marinus Maw, MD  predniSONE (DELTASONE) 20 MG tablet Take 2 tablets (40 mg total) by mouth daily with breakfast. 04/21/17   Mahnoor Mathisen, Canary Brim, MD    Family History Family History  Problem Relation Age of Onset  . Heart attack Mother   . Emphysema Father   . Coronary artery disease Father        s/p cabg    Social History Social History  Substance Use Topics  . Smoking status: Former Smoker    Start date: 11/28/1967    Quit date: 02/29/1988  . Smokeless tobacco: Never Used  . Alcohol use No     Allergies   Patient has no known allergies.   Review of Systems Review of Systems  Respiratory: Positive for cough and shortness of breath.   Cardiovascular: Positive for chest pain.  All other systems reviewed and are negative.    Physical Exam Updated Vital Signs BP 136/80   Pulse 61   Resp 18   Ht 5\' 11"  (1.803 m)   Wt 84.8 kg (187 lb)   SpO2 97%   BMI 26.08 kg/m   Physical Exam  Constitutional: He is oriented to person, place, and time. He appears well-developed and well-nourished. No distress.  HENT:  Head: Normocephalic and atraumatic.  Right Ear: Hearing normal.  Left Ear: Hearing normal.  Nose: Nose normal.  Mouth/Throat: Oropharynx is clear and moist and mucous membranes are normal.  Eyes: Conjunctivae and EOM are normal. Pupils are equal, round, and reactive to light.  Neck: Normal range of motion. Neck supple.  Cardiovascular: Regular rhythm, S1 normal and S2 normal.  Exam reveals no gallop and no friction rub.   No murmur heard. Pulmonary/Chest: Effort normal. No respiratory distress. He has decreased breath sounds in the right lower field. He exhibits no tenderness.  Abdominal: Soft. Normal appearance and bowel sounds are normal. There is no hepatosplenomegaly. There is no tenderness. There is no rebound, no guarding, no tenderness at  McBurney's point and negative Murphy's sign. No hernia.  Musculoskeletal: Normal range of motion.  Neurological: He is alert and oriented to person, place, and time. He has normal strength. No cranial nerve deficit or sensory deficit. Coordination normal. GCS eye subscore is 4. GCS verbal subscore is 5. GCS motor subscore is 6.  Skin: Skin is warm, dry and intact. No rash noted. No cyanosis.  Psychiatric: He has a normal mood and affect. His speech is normal and behavior is normal. Thought content normal.  Nursing note and vitals reviewed.    ED Treatments / Results  Labs (all labs ordered are listed, but only abnormal results are displayed) Labs Reviewed  BASIC METABOLIC PANEL - Abnormal;  Notable for the following:       Result Value   Glucose, Bld 108 (*)    Creatinine, Ser 1.30 (*)    Calcium 8.8 (*)    GFR calc non Af Amer 57 (*)    All other components within normal limits  CBC - Abnormal; Notable for the following:    Hemoglobin 12.9 (*)    All other components within normal limits  BRAIN NATRIURETIC PEPTIDE - Abnormal; Notable for the following:    B Natriuretic Peptide 353.0 (*)    All other components within normal limits  I-STAT TROPOININ, ED    EKG  EKG Interpretation None       Radiology Dg Chest 2 View  Result Date: 04/21/2017 CLINICAL DATA:  Soreness to the chest and abdomen. Cough for 3 days. EXAM: CHEST  2 VIEW COMPARISON:  03/27/2016 FINDINGS: Postoperative changes in the mediastinum. Cardiac pacemaker. Borderline heart size with normal pulmonary vascularity. Small bilateral pleural effusions with basilar atelectasis or infiltration, increasing since previous study. No pneumothorax. Old right rib fractures. IMPRESSION: Small bilateral pleural effusions with basilar atelectasis or infiltration, increasing since previous study. Electronically Signed   By: Burman Nieves M.D.   On: 04/21/2017 04:06    Procedures Procedures (including critical care  time)  Medications Ordered in ED Medications  levofloxacin (LEVAQUIN) IVPB 750 mg (750 mg Intravenous New Bag/Given 04/21/17 0443)  albuterol (PROVENTIL) (2.5 MG/3ML) 0.083% nebulizer solution 5 mg (5 mg Nebulization Given 04/21/17 0310)  furosemide (LASIX) injection 40 mg (40 mg Intravenous Given 04/21/17 0438)  methylPREDNISolone sodium succinate (SOLU-MEDROL) 125 mg/2 mL injection 125 mg (125 mg Intravenous Given 04/21/17 0441)     Initial Impression / Assessment and Plan / ED Course  I have reviewed the triage vital signs and the nursing notes.  Pertinent labs & imaging results that were available during my care of the patient were reviewed by me and considered in my medical decision making (see chart for details).     Patient presents to the emergency department for evaluation of chest congestion, chest tightness with mild shortness of breath. This has been ongoing for 4 days. Patient seen by primary care doctor 2 days ago and told it was likely sinusitis, given a Z-Pak. Patient has not improved.  Patient does have a cardiac history. Symptoms, however, do not seem consistent with cardiac etiology. He has had 4 continuous days of feeling short of breath with tightness in the chest. He is an unremarkable EKG and troponin is negative. After 4 days of continuous symptoms, it would likely show his workup today if this was acute coronary syndrome. He does, however, have a history of congestive heart failure. He takes Lasix 40 mg daily. He does have very small bilateral pleural effusions on his chest x-ray, cannot rule out increasing fluid retention. He does not weigh himself daily. He was diuresed here in the ER with additional IV Lasix and will increase his Lasix intake for the next 3 days. Chest x-ray didn't show infiltrate at the base of both lungs, cannot rule out pneumonia. Patient does have a history of COPD. He was given a breathing treatment with some improvement of his breathing. Was given  Solu-Medrol and changed to Levaquin. Patient is not hypoxic, appears comfortable here in the ER. He does not require hospitalization.  Final Clinical Impressions(s) / ED Diagnoses   Final diagnoses:  Community acquired pneumonia, unspecified laterality    New Prescriptions New Prescriptions   LEVOFLOXACIN (LEVAQUIN) 750 MG TABLET  Take 1 tablet (750 mg total) by mouth daily.   PREDNISONE (DELTASONE) 20 MG TABLET    Take 2 tablets (40 mg total) by mouth daily with breakfast.     Gilda Crease, MD 04/21/17 [id number]

## 2017-04-21 NOTE — Discharge Instructions (Signed)
Double your Lasix (furosemide) for the next 3 days. Stop taking the Zithromax and use the prescribed Levaquin.

## 2017-04-24 ENCOUNTER — Ambulatory Visit (INDEPENDENT_AMBULATORY_CARE_PROVIDER_SITE_OTHER): Payer: Medicare HMO | Admitting: *Deleted

## 2017-04-24 DIAGNOSIS — I255 Ischemic cardiomyopathy: Secondary | ICD-10-CM | POA: Diagnosis not present

## 2017-04-24 DIAGNOSIS — I5022 Chronic systolic (congestive) heart failure: Secondary | ICD-10-CM

## 2017-04-24 NOTE — Progress Notes (Signed)
Remote ICD transmission.   

## 2017-04-25 ENCOUNTER — Other Ambulatory Visit: Payer: Self-pay | Admitting: Internal Medicine

## 2017-04-25 LAB — CUP PACEART REMOTE DEVICE CHECK
Battery Remaining Percentage: 43 %
Battery Voltage: 2.92 V
HighPow Impedance: 50 Ohm
Implantable Pulse Generator Implant Date: 20110209
Lead Channel Impedance Value: 450 Ohm
Lead Channel Setting Pacing Amplitude: 2.5 V
Lead Channel Setting Pacing Pulse Width: 0.4 ms
Lead Channel Setting Sensing Sensitivity: 0.5 mV
MDC IDC LEAD IMPLANT DT: 20110209
MDC IDC LEAD LOCATION: 753860
MDC IDC MSMT BATTERY REMAINING LONGEVITY: 50 mo
MDC IDC MSMT LEADCHNL RV PACING THRESHOLD AMPLITUDE: 0.75 V
MDC IDC MSMT LEADCHNL RV PACING THRESHOLD PULSEWIDTH: 0.4 ms
MDC IDC MSMT LEADCHNL RV SENSING INTR AMPL: 12 mV
MDC IDC PG SERIAL: 747960
MDC IDC SESS DTM: 20180529085514
MDC IDC STAT BRADY RV PERCENT PACED: 1 %

## 2017-04-27 ENCOUNTER — Encounter: Payer: Self-pay | Admitting: Cardiology

## 2017-05-03 ENCOUNTER — Encounter: Payer: Self-pay | Admitting: Internal Medicine

## 2017-05-05 IMAGING — DX DG CHEST 2V
2 series · 2 of 2 positions shown · non-contrast
Comparison: 01/15/2015

CLINICAL DATA: Chest tightness and nausea, cough, prior bypass,
history of CHF

EXAM:
CHEST  2 VIEW

[chest pa]
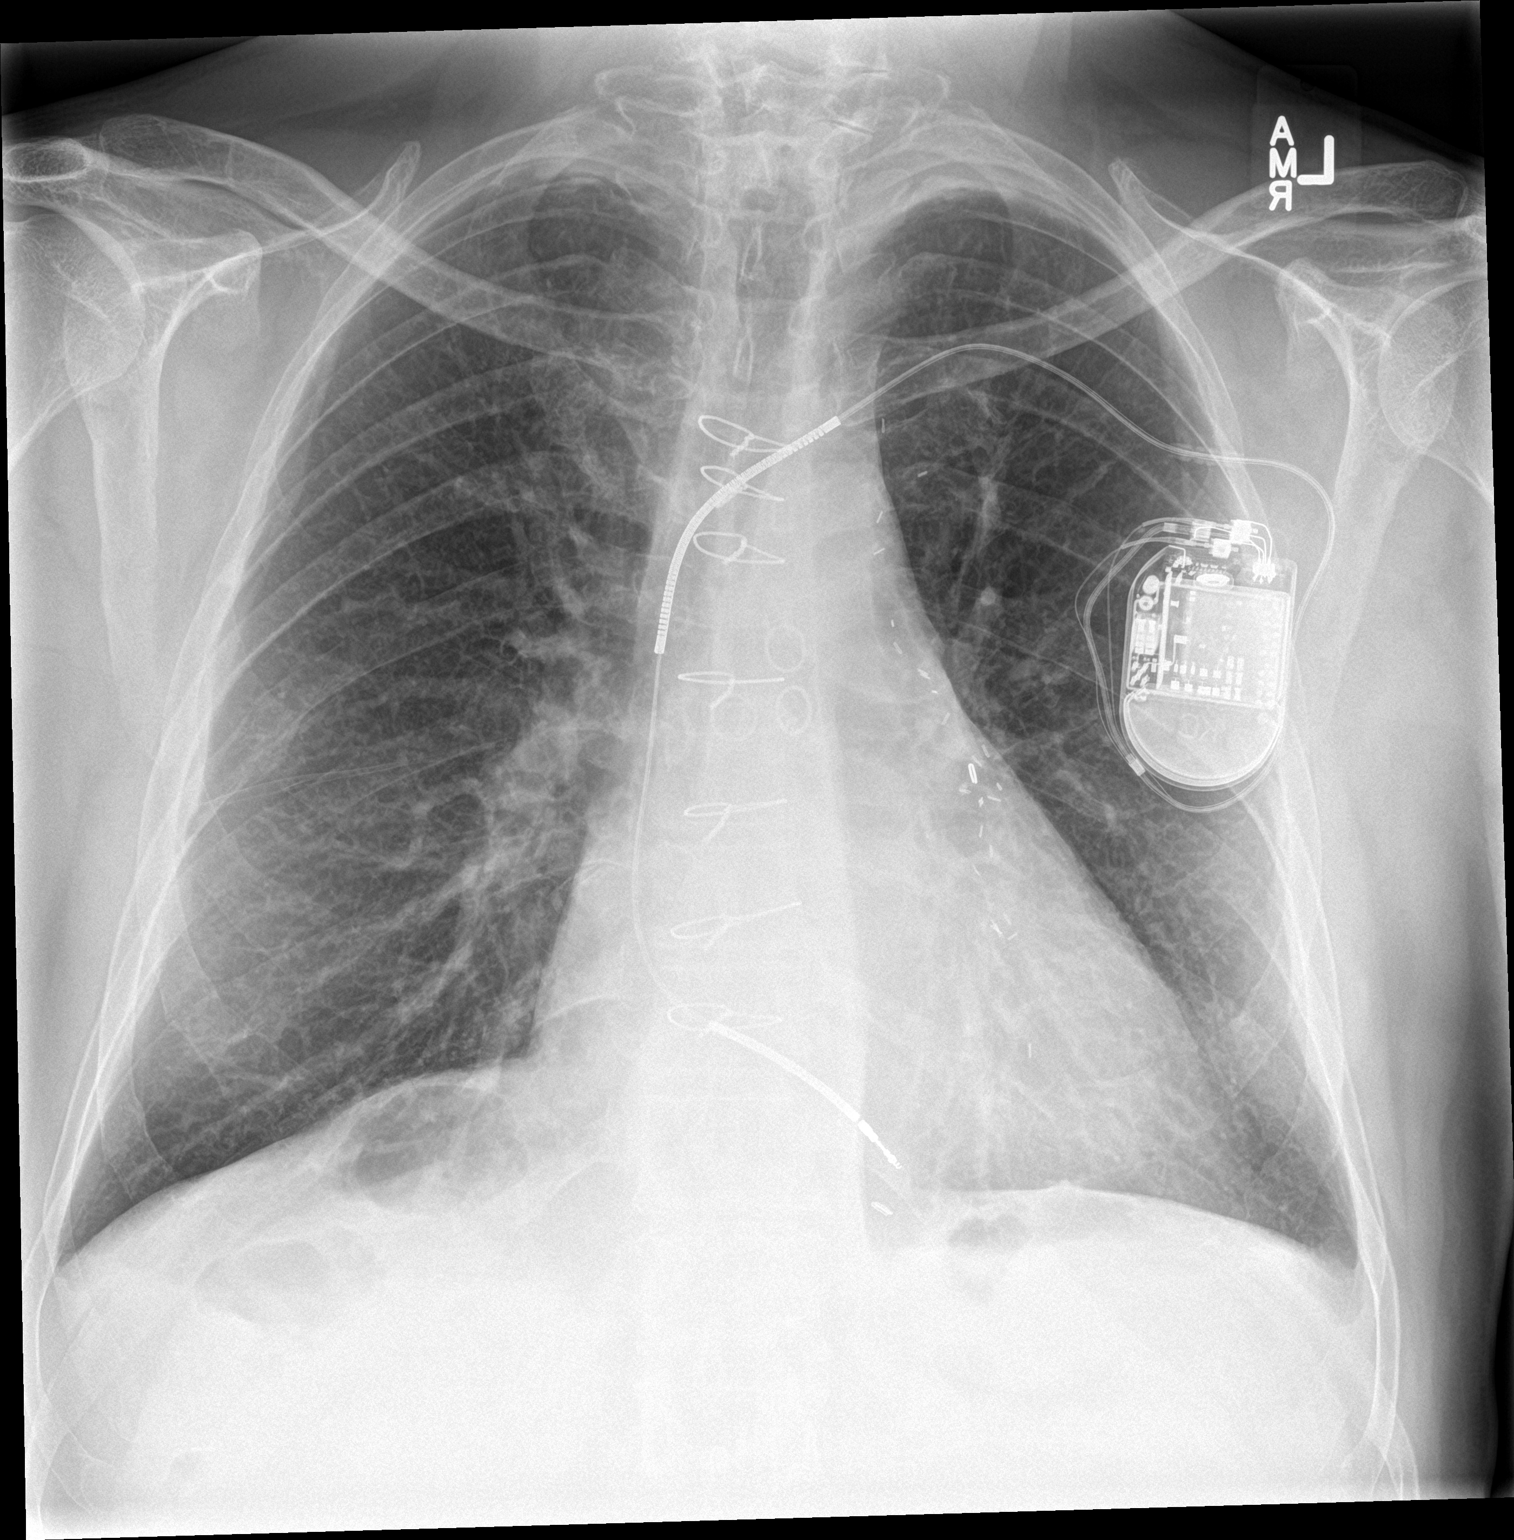

[chest lat]
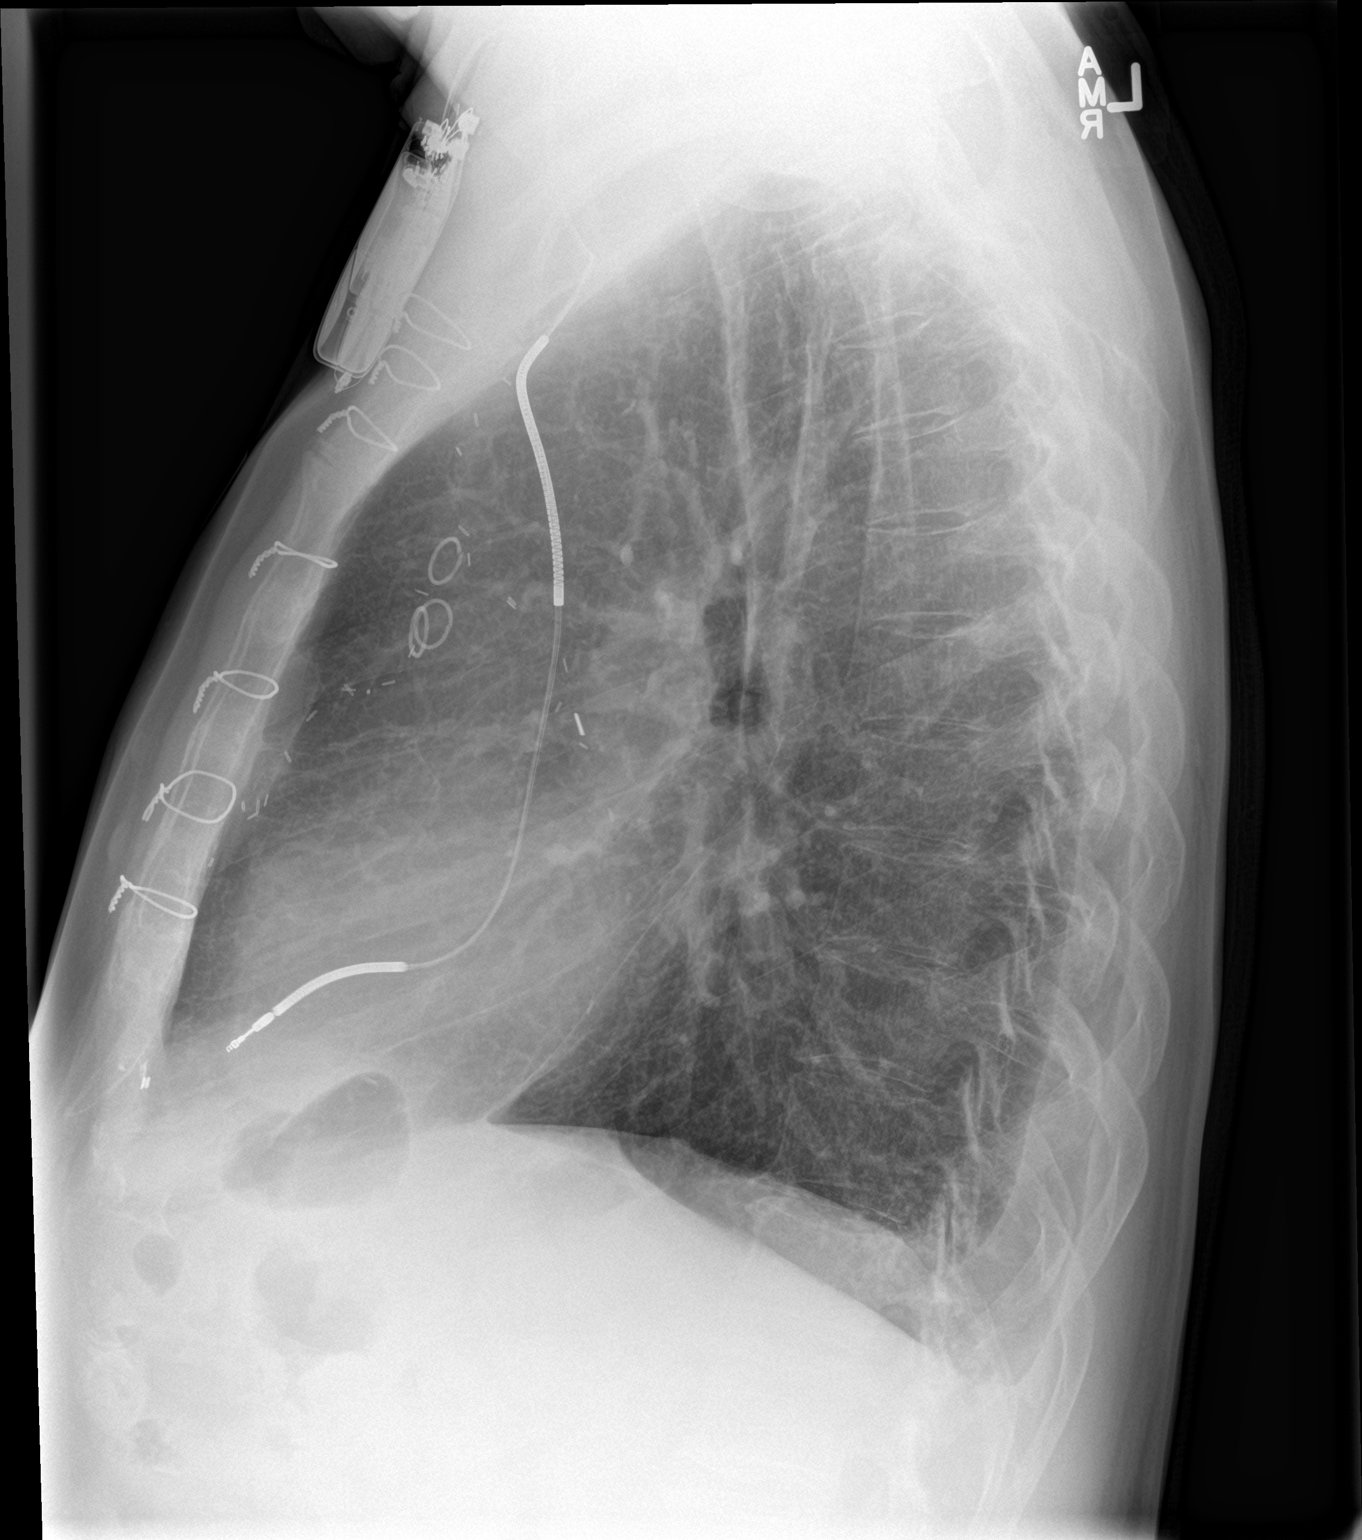

[2 of 2 positions shown; findings below may reference images not displayed]

FINDINGS: Left subclavian single lead defibrillator noted. Prior coronal
bypass changes. Mild cardiomegaly without current CHF, focal
pneumonia, collapse or consolidation. No edema, effusion or
pneumothorax. Trachea is midline. Healed rib fracture on the right.
No acute osseous finding.
IMPRESSION: Postop changes.

Cardiomegaly without CHF or focal pneumonia. No superimposed acute
process.

## 2017-05-20 ENCOUNTER — Other Ambulatory Visit: Payer: Self-pay | Admitting: Cardiology

## 2017-05-21 ENCOUNTER — Other Ambulatory Visit: Payer: Self-pay | Admitting: Internal Medicine

## 2017-05-21 ENCOUNTER — Other Ambulatory Visit: Payer: Self-pay | Admitting: Cardiology

## 2017-05-28 ENCOUNTER — Telehealth: Payer: Self-pay | Admitting: Cardiology

## 2017-05-28 NOTE — Telephone Encounter (Signed)
I reviewed with Joseph [name], PharmD and fish oil 1200 mg would be OK.  I spoke with pt and gave him this information.

## 2017-05-28 NOTE — Telephone Encounter (Signed)
Patient is questioning if it is okay for him to take 1200mg  of Fish Oil in place of 1000mg  due to cost. / tg

## 2017-07-02 ENCOUNTER — Ambulatory Visit (INDEPENDENT_AMBULATORY_CARE_PROVIDER_SITE_OTHER): Payer: Medicare HMO | Admitting: *Deleted

## 2017-07-02 DIAGNOSIS — I255 Ischemic cardiomyopathy: Secondary | ICD-10-CM

## 2017-07-03 NOTE — Progress Notes (Signed)
Remote ICD transmission.   

## 2017-07-04 ENCOUNTER — Other Ambulatory Visit: Payer: Self-pay | Admitting: Cardiology

## 2017-07-04 ENCOUNTER — Encounter: Payer: Self-pay | Admitting: Cardiology

## 2017-07-04 NOTE — Telephone Encounter (Signed)
Rx(s) sent to pharmacy electronically.  

## 2017-07-06 DIAGNOSIS — R7309 Other abnormal glucose: Secondary | ICD-10-CM | POA: Diagnosis not present

## 2017-07-06 DIAGNOSIS — E784 Other hyperlipidemia: Secondary | ICD-10-CM | POA: Diagnosis not present

## 2017-07-06 DIAGNOSIS — R799 Abnormal finding of blood chemistry, unspecified: Secondary | ICD-10-CM | POA: Diagnosis not present

## 2017-07-06 DIAGNOSIS — I10 Essential (primary) hypertension: Secondary | ICD-10-CM | POA: Diagnosis not present

## 2017-07-06 DIAGNOSIS — Z1159 Encounter for screening for other viral diseases: Secondary | ICD-10-CM | POA: Diagnosis not present

## 2017-07-09 DIAGNOSIS — I429 Cardiomyopathy, unspecified: Secondary | ICD-10-CM | POA: Diagnosis not present

## 2017-07-09 DIAGNOSIS — I10 Essential (primary) hypertension: Secondary | ICD-10-CM | POA: Diagnosis not present

## 2017-07-09 DIAGNOSIS — Z8673 Personal history of transient ischemic attack (TIA), and cerebral infarction without residual deficits: Secondary | ICD-10-CM | POA: Diagnosis not present

## 2017-07-09 DIAGNOSIS — K219 Gastro-esophageal reflux disease without esophagitis: Secondary | ICD-10-CM | POA: Diagnosis not present

## 2017-07-09 DIAGNOSIS — I2510 Atherosclerotic heart disease of native coronary artery without angina pectoris: Secondary | ICD-10-CM | POA: Diagnosis not present

## 2017-07-10 LAB — CUP PACEART REMOTE DEVICE CHECK
Battery Remaining Percentage: 41 %
Battery Voltage: 2.92 V
Date Time Interrogation Session: 20180806073010
HighPow Impedance: 42 Ohm
Implantable Lead Implant Date: 20110209
Implantable Lead Location: 753860
Lead Channel Pacing Threshold Amplitude: 0.75 V
Lead Channel Sensing Intrinsic Amplitude: 12 mV
Lead Channel Setting Pacing Amplitude: 2.5 V
MDC IDC MSMT BATTERY REMAINING LONGEVITY: 49 mo
MDC IDC MSMT LEADCHNL RV IMPEDANCE VALUE: 410 Ohm
MDC IDC MSMT LEADCHNL RV PACING THRESHOLD PULSEWIDTH: 0.4 ms
MDC IDC PG IMPLANT DT: 20110209
MDC IDC SET LEADCHNL RV PACING PULSEWIDTH: 0.4 ms
MDC IDC SET LEADCHNL RV SENSING SENSITIVITY: 0.5 mV
MDC IDC STAT BRADY RV PERCENT PACED: 1 %
Pulse Gen Serial Number: 747960

## 2017-07-17 DIAGNOSIS — R195 Other fecal abnormalities: Secondary | ICD-10-CM | POA: Diagnosis not present

## 2017-07-17 DIAGNOSIS — Z1212 Encounter for screening for malignant neoplasm of rectum: Secondary | ICD-10-CM | POA: Diagnosis not present

## 2017-07-17 DIAGNOSIS — B9789 Other viral agents as the cause of diseases classified elsewhere: Secondary | ICD-10-CM | POA: Diagnosis not present

## 2017-07-17 DIAGNOSIS — J069 Acute upper respiratory infection, unspecified: Secondary | ICD-10-CM | POA: Diagnosis not present

## 2017-07-17 DIAGNOSIS — R799 Abnormal finding of blood chemistry, unspecified: Secondary | ICD-10-CM | POA: Diagnosis not present

## 2017-07-17 DIAGNOSIS — H6123 Impacted cerumen, bilateral: Secondary | ICD-10-CM | POA: Diagnosis not present

## 2017-07-17 DIAGNOSIS — I10 Essential (primary) hypertension: Secondary | ICD-10-CM | POA: Diagnosis not present

## 2017-07-17 DIAGNOSIS — Z1211 Encounter for screening for malignant neoplasm of colon: Secondary | ICD-10-CM | POA: Diagnosis not present

## 2017-07-17 DIAGNOSIS — I2510 Atherosclerotic heart disease of native coronary artery without angina pectoris: Secondary | ICD-10-CM | POA: Diagnosis not present

## 2017-07-17 DIAGNOSIS — J449 Chronic obstructive pulmonary disease, unspecified: Secondary | ICD-10-CM | POA: Diagnosis not present

## 2017-08-14 NOTE — Progress Notes (Signed)
HPI The patient returns for followup of coronary disease and cardiomyopathy.  Since I last saw he has had some increased shortness of breath at times. He has to take an occasional extra Lasix. His weight is fluctuating. He says he improves after that. He does have some increase ankle edema. He is watching his salt. He's not describing any chest pressure, neck or arm discomfort. He's not having any palpitations, presyncope or syncope. I did have to switch his beta blocker because of cost.   No Known Allergies  Current Outpatient Prescriptions  Medication Sig Dispense Refill  . acetaminophen (TYLENOL) 500 MG tablet Take 500 mg by mouth every 6 (six) hours as needed for pain.    Marland Kitchen albuterol (PROVENTIL HFA;VENTOLIN HFA) 108 (90 Base) MCG/ACT inhaler Inhale 2 puffs into the lungs every 4 (four) hours as needed. 6.7 g 0  . aspirin 81 MG tablet Take 81 mg by mouth at bedtime.     Marland Kitchen atorvastatin (LIPITOR) 80 MG tablet TAKE 1 TABLET (80 MG TOTAL) BY MOUTH DAILY. 90 tablet 1  . dextromethorphan-guaiFENesin (MUCINEX DM) 30-600 MG per 12 hr tablet Take 1 tablet by mouth 2 (two) times daily as needed for cough.    . ferrous sulfate 325 (65 FE) MG tablet Take 325 mg by mouth 3 (three) times daily with meals.    . fish oil-omega-3 fatty acids 1000 MG capsule Take 1 g by mouth 3 (three) times daily.     . furosemide (LASIX) 40 MG tablet TAKE 1 TABLET (40 MG TOTAL) BY MOUTH DAILY. 90 tablet 3  . KLOR-CON M20 20 MEQ tablet TAKE 1 TABLET (20 MEQ TOTAL) BY MOUTH DAILY. 90 tablet 3  . metoprolol tartrate (LOPRESSOR) 50 MG tablet Take 50 mg by mouth 3 (three) times daily.    . nitroGLYCERIN (NITROSTAT) 0.4 MG SL tablet Place 1 tablet (0.4 mg total) under the tongue every 5 (five) minutes as needed for chest pain. 25 tablet 3  . omeprazole (PRILOSEC) 20 MG capsule TAKE 1 CAPSULE (20 MG TOTAL) BY MOUTH DAILY. 90 capsule 2  . sacubitril-valsartan (ENTRESTO) 24-26 MG Take 1 tablet by mouth 2 (two) times daily. 60  tablet 6   No current facility-administered medications for this visit.     Past Medical History:  Diagnosis Date  . CHF (congestive heart failure) (HCC)    EF 20%  . Coronary artery disease   . GI bleed    Mallory-Weiss tear EGD 02/2010  . Hyperlipidemia   . Ileus (HCC) 2006   required hospitalization  . Left ventricular dysfunction    apical thrombus  . MRSA bacteremia   . 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 Jude  . Small bowel obstruction (HCC) 2007   resolved without surgery    Past Surgical History:  Procedure Laterality Date  . basilic vein left arm resection    . CORONARY ARTERY BYPASS GRAFT     6 vessels, 2000  . INSERT / REPLACE / REMOVE PACEMAKER  01/05/10   ICD insertion/St. Jude single chamber    ROS   Sinus trouble recently  Otherwise stated in the HPI and negative for all other systems.   PHYSICAL EXAM BP 140/83   Pulse 76   Ht 5\' 11"  (1.803 m)   Wt 190 lb (86.2 kg)   BMI 26.50 kg/m   GENERAL:  Well appearing NECK:  No jugular venous distention, waveform within normal limits, carotid upstroke brisk and symmetric,  no bruits, no thyromegaly LUNGS:  Clear to auscultation bilaterally CHEST:  Unremarkable, well healed ICD scar HEART:  PMI not displaced or sustained,S1 and S2 within normal limits, no S3, no S4, no clicks, no rubs, no murmurs ABD:  Flat, positive bowel sounds normal in frequency in pitch, no bruits, no rebound, no guarding, no midline pulsatile mass, no hepatomegaly, no splenomegaly EXT:  2 plus pulses throughout, no edema, no cyanosis no clubbing   ASSESSMENT AND PLAN  Coronary artery disease -  The patient has no new sypmtoms.  No further cardiovascular testing is indicated.  We will continue with aggressive risk reduction and meds as listed.  CARDIOMYOPATHY -  He's carrying some extra volume. I would like to switch him to [facility] and I will start a lower dose. He needs a BMET in two  weeks.     ICD - I reviewed the report from last month.  He is up to date and has normal function without any events.   DYSLIPIDEMIA - LDL was 61 and HDL was 42 in May.  No change in therapy.   DM - A1C was 6.1.    He will continue meds as listed.  CKD - Creat was 1.33.  I will check a BMET in two weeks.

## 2017-08-15 ENCOUNTER — Ambulatory Visit (INDEPENDENT_AMBULATORY_CARE_PROVIDER_SITE_OTHER): Payer: Medicare HMO | Admitting: Cardiology

## 2017-08-15 ENCOUNTER — Encounter: Payer: Self-pay | Admitting: Cardiology

## 2017-08-15 ENCOUNTER — Telehealth: Payer: Self-pay | Admitting: Cardiology

## 2017-08-15 VITALS — BP 140/83 | HR 76 | Ht 71.0 in | Wt 190.0 lb

## 2017-08-15 DIAGNOSIS — I5021 Acute systolic (congestive) heart failure: Secondary | ICD-10-CM

## 2017-08-15 DIAGNOSIS — I2510 Atherosclerotic heart disease of native coronary artery without angina pectoris: Secondary | ICD-10-CM

## 2017-08-15 DIAGNOSIS — N183 Chronic kidney disease, stage 3 unspecified: Secondary | ICD-10-CM | POA: Insufficient documentation

## 2017-08-15 DIAGNOSIS — E1122 Type 2 diabetes mellitus with diabetic chronic kidney disease: Secondary | ICD-10-CM | POA: Insufficient documentation

## 2017-08-15 MED ORDER — SACUBITRIL-VALSARTAN 24-26 MG PO TABS: 1.0000 | ORAL_TABLET | Freq: Two times a day (BID) | ORAL | 6 refills | Status: DC

## 2017-08-15 NOTE — Patient Instructions (Addendum)
Medication Instructions:  Please stop your Enalapril.  Start Entresto 24/26 mg twice a day, 36 hours later. Continue all other medications as listed.  Follow-Up: Follow up in 1 month with Dr Antoine Poche in [place].  If you need a refill on your cardiac medications before your next appointment, please call your pharmacy.  Thank you for choosing [facility]!!

## 2017-08-15 NOTE — Telephone Encounter (Signed)
Placed samples out front for pt to pick up.

## 2017-08-15 NOTE — Telephone Encounter (Signed)
Patient of Dr.Hochrein in [place]. Was told to call here for samples of Entresto 24/26mg  due to them not having samples at the [facility] office. / tg

## 2017-08-16 ENCOUNTER — Other Ambulatory Visit: Payer: Self-pay | Admitting: Cardiology

## 2017-08-23 DIAGNOSIS — R799 Abnormal finding of blood chemistry, unspecified: Secondary | ICD-10-CM | POA: Diagnosis not present

## 2017-09-07 ENCOUNTER — Ambulatory Visit: Payer: Medicare HMO | Admitting: Gastroenterology

## 2017-09-10 ENCOUNTER — Ambulatory Visit (INDEPENDENT_AMBULATORY_CARE_PROVIDER_SITE_OTHER): Payer: Medicare HMO | Admitting: Gastroenterology

## 2017-09-10 ENCOUNTER — Encounter: Payer: Self-pay | Admitting: Gastroenterology

## 2017-09-10 DIAGNOSIS — R195 Other fecal abnormalities: Secondary | ICD-10-CM | POA: Diagnosis not present

## 2017-09-10 NOTE — Patient Instructions (Signed)
1. Colonoscopy as recommended. See separate instructions.  2. Complete upcoming labs.  3. Consider stopping iron therapy.

## 2017-09-10 NOTE — Assessment & Plan Note (Signed)
61 year old gentleman presenting with heme positive stool, recent decline in hemoglobin with decrease in iron. Hemoglobin and iron quickly rebounded with oral supplements. Recommend colonoscopy at this time as he was due for history of tubular adenoma and for heme positive stool. He will hold iron for at least 7 days prior to procedure, could consider stopping altogether at this point. Patient has a Occupational hygienist.  I have discussed the risks, alternatives, benefits with regards to but not limited to the risk of reaction to medication, bleeding, infection, perforation and the patient is agreeable to proceed. Written consent to be obtained.  We'll follow up on pending labs in 2 weeks.

## 2017-09-10 NOTE — Progress Notes (Signed)
cc'ed to pcp °

## 2017-09-10 NOTE — Progress Notes (Signed)
Primary Care Physician:  Joette Catching, MD  Primary Gastroenterologist:  Roetta Sessions, MD   Chief Complaint  Patient presents with  . Heme + stool    HPI:  Joseph [name] is a 61 y.o. male here At the request of Dr. Lysbeth Galas for further evaluation of recent anemia and heme positive stools. He had EGD and colonoscopy back in 2011. He had a solitary cecal tubular adenoma removed. Normal esophagus esophagus on critical Schatzki ring. Advise come back in 7 years for colonoscopy.  Patient reports having routine blood work on August 10 which showed a hemoglobin of 11, hematocrit 36.1, MCV 84. Hemoglobin had been 14 back in April. Repeat labs on August 21 and firm hemoglobin 11.5, iron was low at 35, iron saturation is 10%, TIBC 352. He started iron supplements, 3 per day. Repeat labs in September 27 showed hemoglobin of 13.1, TIBC 280, iron saturation 74%, iron 208. He has additional labs planned for 2 weeks. He was heme positive.   Clinically he has been feeling well. Denies constipation, diarrhea, melena, rectal bleeding. No heartburn, vomiting, dysphagia.     Current Outpatient Prescriptions  Medication Sig Dispense Refill  . acetaminophen (TYLENOL) 500 MG tablet Take 500 mg by mouth every 6 (six) hours as needed for pain.    Marland Kitchen albuterol (PROVENTIL HFA;VENTOLIN HFA) 108 (90 Base) MCG/ACT inhaler Inhale 2 puffs into the lungs every 4 (four) hours as needed. 6.7 g 0  . aspirin 81 MG tablet Take 81 mg by mouth at bedtime.     Marland Kitchen atorvastatin (LIPITOR) 80 MG tablet TAKE 1 TABLET EVERY EVENING 90 tablet 1  . dextromethorphan-guaiFENesin (MUCINEX DM) 30-600 MG per 12 hr tablet Take 1 tablet by mouth 2 (two) times daily as needed for cough.    . ferrous sulfate 325 (65 FE) MG tablet Take 325 mg by mouth 3 (three) times daily with meals.    . fish oil-omega-3 fatty acids 1000 MG capsule Take 1 g by mouth 3 (three) times daily.     . furosemide (LASIX) 40 MG tablet TAKE 1 TABLET (40 MG TOTAL) BY MOUTH  DAILY. 90 tablet 3  . KLOR-CON M20 20 MEQ tablet TAKE 1 TABLET (20 MEQ TOTAL) BY MOUTH DAILY. 90 tablet 3  . metoprolol tartrate (LOPRESSOR) 50 MG tablet Take 50 mg by mouth 2 (two) times daily.     . nitroGLYCERIN (NITROSTAT) 0.4 MG SL tablet Place 1 tablet (0.4 mg total) under the tongue every 5 (five) minutes as needed for chest pain. 25 tablet 3  . omeprazole (PRILOSEC) 20 MG capsule TAKE 1 CAPSULE (20 MG TOTAL) BY MOUTH DAILY. 90 capsule 2  . sacubitril-valsartan (ENTRESTO) 24-26 MG Take 1 tablet by mouth 2 (two) times daily. 60 tablet 6   No current facility-administered medications for this visit.     Allergies as of 09/10/2017  . (No Known Allergies)    Past Medical History:  Diagnosis Date  . CHF (congestive heart failure) (HCC)    EF 20%  . Coronary artery disease   . GI bleed    Mallory-Weiss tear EGD 02/2010  . Hyperlipidemia   . Ileus (HCC) 2006   required hospitalization  . Left ventricular dysfunction    apical thrombus  . MRSA bacteremia   . 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 Jude  . Small bowel obstruction (HCC) 2007   resolved without surgery    Past Surgical History:  Procedure Laterality Date  . basilic  vein left arm resection    . CARDIAC DEFIBRILLATOR PLACEMENT    . CORONARY ARTERY BYPASS GRAFT     6 vessels, 2000  . INSERT / REPLACE / REMOVE PACEMAKER  01/05/10   ICD insertion/St. Jude single chamber    Family History  Problem Relation Age of Onset  . Heart attack Mother   . Emphysema Father   . Coronary artery disease Father        s/p cabg  . Colon cancer Neg Hx     Social History   Social History  . Marital status: Married    Spouse name: N/A  . Number of children: 3  . 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 Smoking status: Former Smoker    Start date: 11/28/1967    Quit date: 02/29/1988  . Smokeless  tobacco: Never Used  . Alcohol use No  . Drug use: No  . Sexual activity: No   Other Topics Concern  . Not on file   Social History Narrative   1 son died age 31-Lee's syndrome            ROS:  General: Negative for anorexia, weight loss, fever, chills, fatigue, weakness. Eyes: Negative for vision changes.  ENT: Negative for hoarseness, difficulty swallowing , nasal congestion. CV: Negative for chest pain, angina, palpitations, dyspnea on exertion, +peripheral edema.  Respiratory: Negative for dyspnea at rest, dyspnea on exertion, cough, sputum, wheezing.  GI: See history of present illness. GU:  Negative for dysuria, hematuria, urinary incontinence, urinary frequency, nocturnal urination.  MS: Negative for joint pain, low back pain.  Derm: Negative for rash or itching.  Neuro: Negative for weakness, abnormal sensation, seizure, frequent headaches, memory loss, confusion.  Psych: Negative for anxiety, depression, suicidal ideation, hallucinations.  Endo: Negative for unusual weight change.  Heme: Negative for bruising or bleeding. Allergy: Negative for rash or hives.    Physical Examination:  BP 138/88   Pulse 72   Temp (!) 97.3 F (36.3 C) (Oral)   Ht 5\' 11"  (1.803 m)   Wt 190 lb 6.4 oz (86.4 kg)   BMI 26.56 kg/m    General: Well-nourished, well-developed in no acute distress.  Head: Normocephalic, atraumatic.   Eyes: Conjunctiva pink, no icterus. Mouth: Oropharyngeal mucosa moist and pink , no lesions erythema or exudate. Neck: Supple without thyromegaly, masses, or lymphadenopathy.  Lungs: Clear to auscultation bilaterally.  Heart: Regular rate and rhythm, no murmurs rubs or gallops. Pacer/defib in left upper chest Abdomen: Bowel sounds are normal, nontender, nondistended, no hepatosplenomegaly or masses, no abdominal bruits or    hernia , no rebound or guarding.   Rectal: not performed Extremities: 1+bilateral lower extremity edema. No clubbing or deformities.   Neuro: Alert and oriented x 4 , grossly normal neurologically.  Skin: Warm and dry, no rash or jaundice.   Psych: Alert and cooperative, normal mood and affect.  Labs: 07/06/2017, BUN 18, creatinine 1.33, total bilirubin 0.4, alkaline phosphatase 120, AST 18, ALT 21, albumin 4.1, hepatitis C antibody negative.  08/23/2017, white blood cell count 8400, hemoglobin 13.1, hematocrit 39.4, MCV 82, platelets 262,000. On 07/17/2017 his symptoms limb 0.5, hematocrit 37.2.  08/23/2017, TIBC 280, iron 208, iron saturation 74%. August 2018 his iron was 35, iron saturation is 10%.   heme positive on 08/2/218 Imaging Studies: No results found.

## 2017-09-11 ENCOUNTER — Other Ambulatory Visit: Payer: Self-pay

## 2017-09-11 ENCOUNTER — Telehealth: Payer: Self-pay

## 2017-09-11 DIAGNOSIS — R195 Other fecal abnormalities: Secondary | ICD-10-CM

## 2017-09-11 MED ORDER — NA SULFATE-K SULFATE-MG SULF 17.5-3.13-1.6 GM/177ML PO SOLN
1.0000 | ORAL | 0 refills | Status: DC
Start: 2017-09-11 — End: 2017-10-28

## 2017-09-11 NOTE — Telephone Encounter (Signed)
Called pt. TCS with RMR scheduled for 10/05/17 at 10:15am. Rx for prep sent to pharmacy. Instructions mailed. Orders entered.

## 2017-09-18 ENCOUNTER — Encounter: Payer: Self-pay | Admitting: Cardiology

## 2017-09-18 DIAGNOSIS — R799 Abnormal finding of blood chemistry, unspecified: Secondary | ICD-10-CM | POA: Diagnosis not present

## 2017-09-18 DIAGNOSIS — E785 Hyperlipidemia, unspecified: Secondary | ICD-10-CM | POA: Diagnosis not present

## 2017-09-20 ENCOUNTER — Telehealth: Payer: Self-pay

## 2017-09-20 NOTE — Telephone Encounter (Signed)
Pt called office and LMOVM to cancel colonoscopy. Stated wanted to wait till after first of the year. Called and informed Endo scheduler.  Routing to LSL as FYI.

## 2017-09-24 NOTE — Telephone Encounter (Signed)
Tried to call pt, no answer on mobile number and voicemail hasn't been set up. No answer at home number. Letter mailed to pt for him to call our office to schedule OV.  Routing to Darl Pikes to get copy of upcoming labs from PCP.

## 2017-09-24 NOTE — Telephone Encounter (Signed)
Noted. Please make sure we get copy of upcoming labs from PCP.  Would patient like to go ahead and make his appt in 11/2017?

## 2017-09-24 NOTE — Telephone Encounter (Signed)
Requested recent and upcoming labs from PCP

## 2017-09-30 NOTE — Progress Notes (Signed)
HPI The patient returns for followup of coronary disease and cardiomyopathy.  At the last visit he had increased SOB and I started him on Entresto.  He returns for follow up.  He did fine with this med change.  He continues to have some mild lower extremity swelling but this is.  He did not have any increased dizziness or palpitation.  He denies any presyncope or syncope.  He thinks his breathing is about the same to slightly better.  He is not describing any significant resting shortness of breath, PND orthopnea.  His weights have been stable.    No Known Allergies  Current Outpatient Medications  Medication Sig Dispense Refill  . acetaminophen (TYLENOL) 500 MG tablet Take 500 mg by mouth every 6 (six) hours as needed for pain.    Marland Kitchen albuterol (PROVENTIL HFA;VENTOLIN HFA) 108 (90 Base) MCG/ACT inhaler Inhale 2 puffs into the lungs every 4 (four) hours as needed. 6.7 g 0  . aspirin 81 MG tablet Take 81 mg by mouth at bedtime.     Marland Kitchen atorvastatin (LIPITOR) 80 MG tablet Take 80 mg daily by mouth.    . dextromethorphan-guaiFENesin (MUCINEX DM) 30-600 MG per 12 hr tablet Take 1 tablet by mouth 2 (two) times daily as needed for cough.    . ferrous sulfate 325 (65 FE) MG tablet Take 325 mg by mouth 3 (three) times daily with meals.    . fish oil-omega-3 fatty acids 1000 MG capsule Take 1 g by mouth 3 (three) times daily.     . furosemide (LASIX) 40 MG tablet TAKE 1 TABLET (40 MG TOTAL) BY MOUTH DAILY. 90 tablet 3  . KLOR-CON M20 20 MEQ tablet TAKE 1 TABLET (20 MEQ TOTAL) BY MOUTH DAILY. 90 tablet 3  . metoprolol tartrate (LOPRESSOR) 50 MG tablet Take 50 mg 3 (three) times daily after meals by mouth.     . nitroGLYCERIN (NITROSTAT) 0.4 MG SL tablet Place 1 tablet (0.4 mg total) under the tongue every 5 (five) minutes as needed for chest pain. 25 tablet 3  . omeprazole (PRILOSEC) 20 MG capsule TAKE 1 CAPSULE (20 MG TOTAL) BY MOUTH DAILY. 90 capsule 2  . Na Sulfate-K Sulfate-Mg Sulf (SUPREP BOWEL  PREP KIT) 17.5-3.13-1.6 GM/177ML SOLN Take 1 kit by mouth as directed. 1 Bottle 0  . sacubitril-valsartan (ENTRESTO) 49-51 MG Take 1 tablet 2 (two) times daily by mouth. 60 tablet 11   No current facility-administered medications for this visit.   Is  Past Medical History:  Diagnosis Date  . CHF (congestive heart failure) (HCC)    EF 20%  . Coronary artery disease   . GI bleed    Mallory-Weiss tear EGD 02/2010  . Hyperlipidemia   . Ileus ([place]) 2006   required hospitalization  . Left ventricular dysfunction    apical thrombus  . MRSA bacteremia   . 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 Jude  . Small bowel obstruction ([place]) 2007   resolved without surgery    Past Surgical History:  Procedure Laterality Date  . basilic vein left arm resection    . CARDIAC DEFIBRILLATOR PLACEMENT    . CORONARY ARTERY BYPASS GRAFT     6 vessels, 2000  . INSERT / REPLACE / REMOVE PACEMAKER  01/05/10   ICD insertion/St. Jude single chamber    ROS   As stated in the HPI and negative for all other systems.   PHYSICAL EXAM BP 126/60  Pulse 64   Ht '5\' 11"'  (1.803 m)   Wt 192 lb (87.1 kg)   BMI 26.78 kg/m   GENERAL:  Well appearing, poor dentition.  NECK:  No jugular venous distention, waveform within normal limits, carotid upstroke brisk and symmetric, no bruits, no thyromegaly LUNGS:  Clear to auscultation bilaterally CHEST:  Well healed ICD scar, Well healed sternotomy scar.  HEART:  PMI not displaced or sustained,S1 and S2 within normal limits, no S3, no S4, no clicks, no rubs, soft apical early peaking systolic murmur nonradiating, no diastolic murmurs ABD:  Flat, positive bowel sounds normal in frequency in pitch, no bruits, no rebound, no guarding, no midline pulsatile mass, no hepatomegaly, no splenomegaly EXT:  2 plus pulses throughout, mild right greater than left leg edema, no cyanosis no clubbing    EKG: Sinus rhythm, rate 63, axis  within normal limits, intervals within normal limits, no acute ST-T wave changes.  10/03/2017   ASSESSMENT AND PLAN  Coronary artery disease -  The patient has no new sypmtoms.  No further cardiovascular testing is indicated.  We will continue with aggressive risk reduction and meds as listed.  CARDIOMYOPATHY -  Today I am going to titrate his Entresto to the 49/51 dose.  Of note he is going to have his basic metabolic profile checked apparently next month.  Labs from late last month looked fine.  Creatinine and potassium were stable.  ICD - He is up to date with follow up.    DYSLIPIDEMIA - His last lipids in May were at target.   DM -   A1C was 5.5 .  CKD -   Creat was 1.11.

## 2017-10-01 ENCOUNTER — Ambulatory Visit (INDEPENDENT_AMBULATORY_CARE_PROVIDER_SITE_OTHER): Payer: Medicare HMO | Admitting: *Deleted

## 2017-10-01 DIAGNOSIS — I255 Ischemic cardiomyopathy: Secondary | ICD-10-CM

## 2017-10-02 NOTE — Progress Notes (Signed)
Remote ICD transmission.   

## 2017-10-03 ENCOUNTER — Encounter: Payer: Self-pay | Admitting: Cardiology

## 2017-10-03 ENCOUNTER — Telehealth: Payer: Self-pay | Admitting: *Deleted

## 2017-10-03 ENCOUNTER — Ambulatory Visit (INDEPENDENT_AMBULATORY_CARE_PROVIDER_SITE_OTHER): Payer: Medicare HMO | Admitting: Cardiology

## 2017-10-03 VITALS — BP 126/60 | HR 64 | Ht 71.0 in | Wt 192.0 lb

## 2017-10-03 DIAGNOSIS — I5022 Chronic systolic (congestive) heart failure: Secondary | ICD-10-CM

## 2017-10-03 DIAGNOSIS — I10 Essential (primary) hypertension: Secondary | ICD-10-CM | POA: Diagnosis not present

## 2017-10-03 MED ORDER — SACUBITRIL-VALSARTAN 49-51 MG PO TABS: 1.0000 | ORAL_TABLET | Freq: Two times a day (BID) | ORAL | 11 refills | Status: DC

## 2017-10-03 MED ORDER — SACUBITRIL-VALSARTAN 49-51 MG PO TABS: 1.0000 | ORAL_TABLET | Freq: Two times a day (BID) | ORAL | 1 refills | Status: DC

## 2017-10-03 NOTE — Patient Instructions (Addendum)
Medication Instructions:  Please increase your Entresto to 49-51 mg one tablet twice a day. Continue all other medications as listed.  Follow-Up: Follow up in 3 months with Dr. Antoine Poche in [place].    If you need a refill on your cardiac medications before your next appointment, please call your pharmacy.  Thank you for choosing  [facility]!!

## 2017-10-03 NOTE — Telephone Encounter (Signed)
Patient given 1 box of 49mg /51mg  samples Lot # [id number] Exp: Oct 2019

## 2017-10-05 ENCOUNTER — Ambulatory Visit (HOSPITAL_COMMUNITY): Admission: RE | Admit: 2017-10-05 | Payer: Medicare HMO | Source: Ambulatory Visit | Admitting: Internal Medicine

## 2017-10-05 ENCOUNTER — Encounter (HOSPITAL_COMMUNITY): Admission: RE | Payer: Self-pay | Source: Ambulatory Visit

## 2017-10-05 SURGERY — COLONOSCOPY
Anesthesia: Moderate Sedation

## 2017-10-16 LAB — CUP PACEART REMOTE DEVICE CHECK
Battery Remaining Longevity: 46 mo
Battery Remaining Percentage: 39 %
Brady Statistic RV Percent Paced: 1 %
Date Time Interrogation Session: 20181106094119
HIGH POWER IMPEDANCE MEASURED VALUE: 42 Ohm
Lead Channel Impedance Value: 440 Ohm
Lead Channel Pacing Threshold Amplitude: 0.75 V
Lead Channel Pacing Threshold Pulse Width: 0.4 ms
Lead Channel Sensing Intrinsic Amplitude: 12 mV
Lead Channel Setting Pacing Pulse Width: 0.4 ms
MDC IDC LEAD IMPLANT DT: 20110209
MDC IDC LEAD LOCATION: 753860
MDC IDC MSMT BATTERY VOLTAGE: 2.92 V
MDC IDC PG IMPLANT DT: 20110209
MDC IDC PG SERIAL: 747960
MDC IDC SET LEADCHNL RV PACING AMPLITUDE: 2.5 V
MDC IDC SET LEADCHNL RV SENSING SENSITIVITY: 0.5 mV

## 2017-10-28 ENCOUNTER — Emergency Department (HOSPITAL_COMMUNITY): Payer: Medicare HMO

## 2017-10-28 ENCOUNTER — Encounter (HOSPITAL_COMMUNITY): Payer: Self-pay | Admitting: Emergency Medicine

## 2017-10-28 ENCOUNTER — Other Ambulatory Visit: Payer: Self-pay

## 2017-10-28 ENCOUNTER — Emergency Department (HOSPITAL_COMMUNITY)
Admission: EM | Admit: 2017-10-28 | Discharge: 2017-10-28 | Disposition: A | Discharge status: Home / Self Care | Payer: Medicare HMO | Attending: Emergency Medicine | Admitting: Emergency Medicine

## 2017-10-28 DIAGNOSIS — Z87891 Personal history of nicotine dependence: Secondary | ICD-10-CM | POA: Insufficient documentation

## 2017-10-28 DIAGNOSIS — Z951 Presence of aortocoronary bypass graft: Secondary | ICD-10-CM | POA: Insufficient documentation

## 2017-10-28 DIAGNOSIS — Z7982 Long term (current) use of aspirin: Secondary | ICD-10-CM | POA: Diagnosis not present

## 2017-10-28 DIAGNOSIS — R05 Cough: Secondary | ICD-10-CM | POA: Diagnosis not present

## 2017-10-28 DIAGNOSIS — R7989 Other specified abnormal findings of blood chemistry: Secondary | ICD-10-CM | POA: Insufficient documentation

## 2017-10-28 DIAGNOSIS — I2510 Atherosclerotic heart disease of native coronary artery without angina pectoris: Secondary | ICD-10-CM | POA: Insufficient documentation

## 2017-10-28 DIAGNOSIS — I5022 Chronic systolic (congestive) heart failure: Secondary | ICD-10-CM | POA: Diagnosis not present

## 2017-10-28 DIAGNOSIS — Z95810 Presence of automatic (implantable) cardiac defibrillator: Secondary | ICD-10-CM | POA: Diagnosis not present

## 2017-10-28 DIAGNOSIS — I110 Hypertensive heart disease with heart failure: Secondary | ICD-10-CM | POA: Insufficient documentation

## 2017-10-28 DIAGNOSIS — Z79899 Other long term (current) drug therapy: Secondary | ICD-10-CM | POA: Insufficient documentation

## 2017-10-28 DIAGNOSIS — R0600 Dyspnea, unspecified: Secondary | ICD-10-CM | POA: Insufficient documentation

## 2017-10-28 DIAGNOSIS — R058 Other specified cough: Secondary | ICD-10-CM

## 2017-10-28 LAB — COMPREHENSIVE METABOLIC PANEL
ALK PHOS: 121 U/L (ref 38–126)
ALT: 32 U/L (ref 17–63)
ANION GAP: 6 (ref 5–15)
AST: 27 U/L (ref 15–41)
Albumin: 3.4 g/dL — ABNORMAL LOW (ref 3.5–5.0)
BILIRUBIN TOTAL: 0.4 mg/dL (ref 0.3–1.2)
BUN: 14 mg/dL (ref 6–20)
CALCIUM: 8.7 mg/dL — AB (ref 8.9–10.3)
CO2: 29 mmol/L (ref 22–32)
CREATININE: 1.37 mg/dL — AB (ref 0.61–1.24)
Chloride: 104 mmol/L (ref 101–111)
GFR, EST NON AFRICAN AMERICAN: 53 mL/min — AB (ref >60)
Glucose, Bld: 115 mg/dL — ABNORMAL HIGH (ref 65–99)
Potassium: 4 mmol/L (ref 3.5–5.1)
SODIUM: 139 mmol/L (ref 135–145)
TOTAL PROTEIN: 6.9 g/dL (ref 6.5–8.1)

## 2017-10-28 LAB — CBC
HCT: 43.5 % (ref 39.0–52.0)
Hemoglobin: 13.6 g/dL (ref 13.0–17.0)
MCH: 27.2 pg (ref 26.0–34.0)
MCHC: 31.3 g/dL (ref 30.0–36.0)
MCV: 87 fL (ref 78.0–100.0)
PLATELETS: 206 10*3/uL (ref 150–400)
RBC: 5 MIL/uL (ref 4.22–5.81)
RDW: 15 % (ref 11.5–15.5)
WBC: 7.7 10*3/uL (ref 4.0–10.5)

## 2017-10-28 LAB — I-STAT TROPONIN, ED: TROPONIN I, POC: 0.01 ng/mL (ref 0.00–0.08)

## 2017-10-28 LAB — BRAIN NATRIURETIC PEPTIDE: B Natriuretic Peptide: 432 pg/mL — ABNORMAL HIGH (ref 0.0–100.0)

## 2017-10-28 MED ORDER — FUROSEMIDE 10 MG/ML IJ SOLN
80.0000 mg | Freq: Once | INTRAMUSCULAR | Status: AC
  Administered 2017-10-28: 80 mg via INTRAVENOUS
  Filled 2017-10-28: qty 8

## 2017-10-28 MED ORDER — ALBUTEROL SULFATE HFA 108 (90 BASE) MCG/ACT IN AERS
2.0000 | INHALATION_SPRAY | Freq: Once | RESPIRATORY_TRACT | Status: AC
  Administered 2017-10-28: 2 via RESPIRATORY_TRACT
  Filled 2017-10-28: qty 6.7

## 2017-10-28 NOTE — ED Notes (Signed)
MM in to assess

## 2017-10-28 NOTE — ED Triage Notes (Signed)
Patient c/o productive cough with thick yellow sputum and nasal and chest congestion that started yesterday. Patient states "It getter better this morning but now it's starting back up." Patient unsure of any fevers.

## 2017-10-28 NOTE — Discharge Instructions (Signed)
Please call and schedule an appointment with your cardiology for sometime in the next few days.  In the meantime, please double your dose of Lasix until you are seen by cardiology. Take your first dose in the morning when you wake up and your second dose in the afternoon, at least 6 hours after your first dose.   If you develop new or worsening symptoms including, fever that doesn't improve with Tylenol, difficulty breathing, or other concerning symptoms, please return to the Emergency Department for re-evaluation.

## 2017-10-28 NOTE — ED Notes (Signed)
Ambulated pt around nurses station. Pt did great stats stayed around 98.

## 2017-10-28 NOTE — ED Notes (Signed)
  Due to pt history, radiology report and orthopnea, weight gain and dyspnea, as well as questionable increased swelling to his leg, pt is moved to acute side for monitoring and cardiac assessments  Labs collected

## 2017-10-28 NOTE — ED Provider Notes (Signed)
Medical screening examination/treatment/procedure(s) were conducted as a shared visit with non-physician practitioner(s) and myself.  I personally evaluated the patient during the encounter.  61 year old male with dyspnea and cough is been getting worse for the last few weeks but specifically over the last week.  He is also had a 8-9 pound weight gain during that time as well.  No lower extremity edema states his breathing does seem to be a bit worse with laying flat.  Has had some light yellow productive cough and wife states that he felt warm. Exam with crackles and expiratory wheezing not improved with albuterol.  Patient ambulates without hypoxia or tachypnea. X-ray and labs consistent with CHF.  Rest of his workup here is unremarkable I feel like infectious causes are less likely at this time.  We will give a dose of Lasix here and double his doses at home until follow up with cardiology this week.    EKG Interpretation  Date/Time:  Sunday October 28 2017 17:50:14 EST Ventricular Rate:  63 PR Interval:  148 QRS Duration: 90 QT Interval:  424 QTC Calculation: 433 R Axis:   60 Text Interpretation:  Normal sinus rhythm Minimal voltage criteria for LVH, may be normal variant Nonspecific ST and T wave abnormality Abnormal ECG No significant change since last tracing Confirmed by Linwood Dibbles [phone]) on 10/28/2017 5:54:19 PM         Terrionna Bridwell, Barbara Cower, MD 10/28/17 2255

## 2017-10-28 NOTE — ED Provider Notes (Signed)
[facility] EMERGENCY DEPARTMENT Provider Note   CSN: [id number] Arrival date & time: 10/28/17  1607     History   Chief Complaint Chief Complaint  Patient presents with  . Cough    HPI Joseph [name] is a 61 y.o. male with a history of CHF, CAD, and CAP who presents to the emergency department with a chief complaint of productive cough with yellow sputum, nasal congestion, and chest tightness and congestion that began yesterday.    He also complains of mild dyspnea with exertion over the last week.  He also complains of orthopnea x1 day where he required sleeping upright in his recliner because laying flat "made his chest congestion worse".  He reports mild worsening swelling in his bilateral lower extremities, but reports that it is mildly improved since last week.  He required one extra dose of Lasix for 1 day last week. He is currently on 40 mg daily of Lasix.  He checks his weight daily, but reports that it has been fluctuating over the last few weeks.  It has been as high as 189 pounds.  Yesterday, he was at 183 pounds.  He reports that his cardiologist that he met a goal weight of 180 pounds.  He denies fever, chills, back pain, N/V/D, abdominal pain, lightheadedness, dizziness or HA.   He reports that he was seen by cardiology within the last 3 weeks and is not scheduled for a follow-up appointment for 3 months. He has been a non-smoker for the last ~30 years, but previously was a 1 ppd smoker. He denies a h/o of COPD.   The history is provided by the patient. No language interpreter was used.    Past Medical History:  Diagnosis Date  . CHF (congestive heart failure) (HCC)    EF 20%  . Coronary artery disease   . GI bleed    Mallory-Weiss tear EGD 02/2010  . Hyperlipidemia   . Ileus ([place]) 2006   required hospitalization  . Left ventricular dysfunction    apical thrombus  . MRSA bacteremia   . 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 Jude  . Small bowel obstruction ([place]) 2007   resolved without surgery    Patient Active Problem List   Diagnosis Date Noted  . Heme + stool 09/10/2017  . Stage 3 chronic kidney disease ([place]) 08/15/2017  . Essential hypertension   . Acute systolic HF (heart failure) ([place]) 01/16/2015  . CHF (congestive heart failure) ([place]) 01/16/2015  . Arteriosclerosis of coronary artery 08/05/2014  . Essential (primary) hypertension 08/05/2014  . Acid reflux 08/05/2014  . Hx of CABG 09/06/2011  . Chronic systolic heart failure ([employer]) 05/02/2011  . LAD stenosis   . Coronary artery disease   . BACTEREMIA 03/30/2010  . Automatic implantable cardioverter-defibrillator in situ 01/12/2010  . Other specified forms of chronic ischemic heart disease 11/03/2009  . Cardiomyopathy, ischemic 11/03/2009  . HYPERLIPIDEMIA 02/02/2009    Past Surgical History:  Procedure Laterality Date  . basilic vein left arm resection    . CARDIAC DEFIBRILLATOR PLACEMENT    . CORONARY ARTERY BYPASS GRAFT     6 vessels, 2000  . INSERT / REPLACE / REMOVE PACEMAKER  01/05/10   ICD insertion/St. Jude single chamber       Home Medications    Prior to Admission medications   Medication Sig Start Date End Date Taking? Authorizing Provider  acetaminophen (TYLENOL) 500 MG tablet Take 500 mg by mouth every  6 (six) hours as needed for pain.   Yes [provider]  albuterol (PROVENTIL HFA;VENTOLIN HFA) 108 (90 Base) MCG/ACT inhaler Inhale 2 puffs into the lungs every 4 (four) hours as needed. 03/27/16  Yes Rolland Porter, MD  aspirin 81 MG tablet Take 81 mg by mouth at bedtime.    Yes [provider]  atorvastatin (LIPITOR) 80 MG tablet Take 80 mg daily by mouth.   Yes [provider]  dextromethorphan-guaiFENesin (MUCINEX DM) 30-600 MG per 12 hr tablet Take 1 tablet by mouth 2 (two) times daily as needed for cough.   Yes [provider]  fish oil-omega-3 fatty acids 1000 MG capsule  Take 1 g by mouth 3 (three) times daily.    Yes [provider]  furosemide (LASIX) 40 MG tablet TAKE 1 TABLET (40 MG TOTAL) BY MOUTH DAILY. 04/25/17  Yes Evans Lance, MD  KLOR-CON M20 20 MEQ tablet TAKE 1 TABLET (20 MEQ TOTAL) BY MOUTH DAILY. 05/21/17  Yes Minus Breeding, MD  metoprolol tartrate (LOPRESSOR) 50 MG tablet Take 50 mg 3 (three) times daily after meals by mouth.    Yes [provider]  nitroGLYCERIN (NITROSTAT) 0.4 MG SL tablet Place 1 tablet (0.4 mg total) under the tongue every 5 (five) minutes as needed for chest pain. 05/24/16  Yes Minus Breeding, MD  omeprazole (PRILOSEC) 20 MG capsule TAKE 1 CAPSULE (20 MG TOTAL) BY MOUTH DAILY. 07/04/17  Yes Lorretta Harp, MD  sacubitril-valsartan (ENTRESTO) 49-51 MG Take 1 tablet 2 (two) times daily by mouth. 10/03/17  Yes Minus Breeding, MD    Family History Family History  Problem Relation Age of Onset  . Heart attack Mother   . Emphysema Father   . Coronary artery disease Father        s/p cabg  . Colon cancer Neg Hx     Social History Social History   Tobacco Use  . Smoking status: Former Smoker    Types: Cigarettes    Start date: 11/28/1967    Last attempt to quit: 02/29/1988    Years since quitting: 29.6  . Smokeless tobacco: Never Used  Substance Use Topics  . Alcohol use: No    Alcohol/week: 0.0 oz  . Drug use: No     Allergies   Patient has no known allergies.   Review of Systems Review of Systems  Constitutional: Positive for unexpected weight change. Negative for chills and fever.  HENT: Negative for congestion, sinus pressure and sinus pain.   Respiratory: Positive for cough and shortness of breath.   Cardiovascular: Positive for chest pain ("congestion") and leg swelling. Negative for palpitations.  Gastrointestinal: Negative for abdominal pain, diarrhea, nausea and vomiting.  Genitourinary: Negative for dysuria.  Musculoskeletal: Negative for back pain.  Skin: Negative for rash.    Neurological: Negative for dizziness, weakness and light-headedness.  Psychiatric/Behavioral: Negative for confusion.     Physical Exam Updated Vital Signs BP 110/87   Pulse 86   Temp 98 F (36.7 C) (Oral)   Resp 20   Wt 83 kg (183 lb)   SpO2 98%   BMI 25.52 kg/m   Physical Exam  Constitutional: He appears well-developed.  HENT:  Head: Normocephalic.  Nose: Rhinorrhea present. No mucosal edema.  Eyes: Conjunctivae are normal.  Neck: Normal range of motion. Neck supple. No JVD present. No tracheal deviation present. No thyromegaly present.  Cardiovascular: Normal rate, regular rhythm and intact distal pulses. Exam reveals no gallop and no friction rub.  No murmur heard. Pulmonary/Chest: Effort normal. No stridor. No respiratory distress. He has wheezes. He exhibits no tenderness.  Scattered bilateral expiratory wheezes noted in all fields. No crackles noted.   Abdominal: Soft. He exhibits no distension and no mass. There is no tenderness. There is no rebound and no guarding. No hernia.  Musculoskeletal: He exhibits edema. He exhibits no tenderness or deformity.  1+ bilateral lower extremity pitting edema.  Neurological: He is alert.  Skin: Skin is warm and dry.  Psychiatric: His behavior is normal.  Nursing note and vitals reviewed.    ED Treatments / Results  Labs (all labs ordered are listed, but only abnormal results are displayed) Labs Reviewed  COMPREHENSIVE METABOLIC PANEL - Abnormal; Notable for the following components:      Result Value   Glucose, Bld 115 (*)    Creatinine, Ser 1.37 (*)    Calcium 8.7 (*)    Albumin 3.4 (*)    GFR calc non Af Amer 53 (*)    All other components within normal limits  BRAIN NATRIURETIC PEPTIDE - Abnormal; Notable for the following components:   B Natriuretic Peptide 432.0 (*)    All other components within normal limits  CBC  I-STAT TROPONIN, ED  I-STAT TROPONIN, ED    EKG  EKG Interpretation  Date/Time:  'Sunday  October 28 2017 17:50:14 EST Ventricular Rate:  63 PR Interval:  148 QRS Duration: 90 QT Interval:  424 QTC Calculation: 433 R Axis:   60 Text Interpretation:  Normal sinus rhythm Minimal voltage criteria for LVH, may be normal variant Nonspecific ST and T wave abnormality Abnormal ECG No significant change since last tracing Confirmed by Knapp, Jon ([id number]) on 10/28/2017 5:54:19 PM       Radiology Dg Chest 2 View  Result Date: 10/28/2017 CLINICAL DATA:  Cough.  History of CHF. EXAM: CHEST  2 VIEW COMPARISON:  Apr 21, 2017 FINDINGS: Mild cardiomegaly. No overt edema. No pneumothorax. Small effusions, left greater than right. Healed right rib fractures. An 8 mm nodule projected over the right posterolateral fifth rib was noted to represent a bone island on previous CT imaging. No suspicious nodules or masses. No focal infiltrates. IMPRESSION: 1. Small effusions, left greater than right. No overt edema. No other acute abnormalities. Electronically Signed   By: David  Williams III M.D   On: 10/28/2017 16:42    Procedures Procedures (including critical care time)  Medications Ordered in ED Medications  albuterol (PROVENTIL HFA;VENTOLIN HFA) 108 (90 Base) MCG/ACT inhaler 2 puff (2 puffs Inhalation Given 10/28/17 1957)  furosemide (LASIX) injection 80 mg (80 mg Intravenous Given 10/28/17 2000)     Initial Impression / Assessment and Plan / ED Course  I have reviewed the triage vital signs and the nursing notes.  Pertinent labs & imaging results that were available during my care of the patient were reviewed by me and considered in my medical decision making (see chart for details).     61'  year old male with a history of CHF, CAD, and community-acquired pneumonia presenting with 1 day of orthopnea, productive cough, and chest congestion.  The patient and the plan was discussed with Dr. Dayna Barker, attending physician. He reports intermittent lower extremity edema, and fluctuating daily weights  over the last week. On physical exam, bilateral expiratory wheezes heard in all fields. No crackles appreciated. CXR with small bilaterally effusions, L>F without overt edema. BNP 432. No leukocytosis. Hemodynamically stable. Patient was given 2 puffs of albuterol in the ED with no  improvement of his wheezing. Considered, CAP, but given his elevated BNP, lack of fever, and no leukocytosis, I suspect the etiology of his symptoms are related to a CHF exacerbation. No orthostatic HTN. SaO2 remained 99% on RA with ambulation. Will d/c the patient to home, and he is instructed to double his Lasix daily and schedule an appointment with Cardiology this week. His daughter works for the Cardiology practice and informed the patient she can likely get him a follow up appointment in the next few days. Strict return precautions were given. NAD. The patient is safe for d/c at this time.   Final Clinical Impressions(s) / ED Diagnoses   Final diagnoses:  Elevated brain natriuretic peptide (BNP) level  Productive cough    ED Discharge Orders    None       Joanne Gavel, PA-C 10/28/17 2247    Mesner, Corene Cornea, MD 10/28/17 2255

## 2017-10-30 NOTE — Progress Notes (Signed)
Cardiology Office Note   Date:  11/01/2017   ID:  Joseph [name], Joseph [name] 26-Aug-1956, MRN [id number]  PCP:  Joette Catching, MD  Cardiologist: Rollene Rotunda, MD Chief Complaint  Patient presents with  . Coronary Artery Disease  . Cardiomyopathy  . Congestive Heart Failure  . Hospitalization Follow-up     History of Present Illness: Joseph [name] is a 61 y.o. male who presents for ongoing assessment and management of coronary artery disease, cardiomyopathy, reduced EF of 25% to 30%, status post ICD defibrillator (2011- St Jude chronic  systolic heart failure, hyperlipidemia, history of GI bleed with Mallory-Weiss tear 02/2010, ongoing tobacco abuse.  He was last seen by Dr. Antoine Poche on 10/03/2017, at which time he was without complaint.  On that office visit his Sherryll Burger was increased to 49 mg / 51 mg dosing.  He was to continue other medications as directed.  Follow-up labs were ordered.  Unfortunately, the patient presented to the emergency room on 10/28/2017 with worsening dyspnea, PND, orthopnea, with productive yellow phlegm on coughing.  Chest x-ray revealed cardiomegaly without overt edema or pneumothorax however he did have small bilateral pleural effusions left greater than right.  He was treated with intravenous Lasix 80 mg x1 and albuterol treatment.  The patient was increased on Lasix dose to 80 mg daily.  He is here for post ER follow-up.  Weight on evaluation in ER was 183 pounds.  Weight on evaluation on 10/03/2017 office visit weight was 192 pounds.  In the emergency room labs were as follows:    Sodium 139; potassium 4.0; chloride 106; CO2 29; glucose 115; BUN 14; creatinine 1.37.  LFTs were normal limits    BNP 432.  Troponin negative x2.    Hemoglobin 13.6; hematocrit 43.5; red cells 7.7, platelets 206.  He comes today with continued coughing and congestion.  Productive.  His weight is 185 pounds today.  He does not complain of any PND or orthopnea, but does have some dyspnea on  exertion.  Generalized fatigue.  He has had a fever at home but no rigors.  Past Medical History:  Diagnosis Date  . CHF (congestive heart failure) (HCC)    EF 20%  . Coronary artery disease   . GI bleed    Mallory-Weiss tear EGD 02/2010  . Hyperlipidemia   . Ileus (HCC) 2006   required hospitalization  . Left ventricular dysfunction    apical thrombus  . MRSA bacteremia   . 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 Jude  . Small bowel obstruction (HCC) 2007   resolved without surgery    Past Surgical History:  Procedure Laterality Date  . basilic vein left arm resection    . CARDIAC DEFIBRILLATOR PLACEMENT    . CORONARY ARTERY BYPASS GRAFT     6 vessels, 2000  . INSERT / REPLACE / REMOVE PACEMAKER  01/05/10   ICD insertion/St. Jude single chamber     Current Outpatient Medications  Medication Sig Dispense Refill  . acetaminophen (TYLENOL) 500 MG tablet Take 500 mg by mouth every 6 (six) hours as needed for pain.    Marland Kitchen albuterol (PROVENTIL HFA;VENTOLIN HFA) 108 (90 Base) MCG/ACT inhaler Inhale 2 puffs into the lungs every 4 (four) hours as needed. 6.7 g 0  . aspirin 81 MG tablet Take 81 mg by mouth at bedtime.     Marland Kitchen atorvastatin (LIPITOR) 80 MG tablet Take 80 mg daily by mouth.    . dextromethorphan-guaiFENesin [facility] DM)  30-600 MG per 12 hr tablet Take 1 tablet by mouth 2 (two) times daily as needed for cough.    . fish oil-omega-3 fatty acids 1000 MG capsule Take 1 g by mouth 3 (three) times daily.     . furosemide (LASIX) 40 MG tablet TAKE 1 TABLET (40 MG TOTAL) BY MOUTH DAILY. (Patient taking differently: Take 80 mg by mouth daily. ) 90 tablet 3  . KLOR-CON M20 20 MEQ tablet TAKE 1 TABLET (20 MEQ TOTAL) BY MOUTH DAILY. 90 tablet 3  . metoprolol tartrate (LOPRESSOR) 50 MG tablet Take 50 mg 3 (three) times daily after meals by mouth.     . nitroGLYCERIN (NITROSTAT) 0.4 MG SL tablet Place 1 tablet (0.4 mg total) under the tongue  every 5 (five) minutes as needed for chest pain. 25 tablet 3  . omeprazole (PRILOSEC) 20 MG capsule TAKE 1 CAPSULE (20 MG TOTAL) BY MOUTH DAILY. 90 capsule 2  . sacubitril-valsartan (ENTRESTO) 49-51 MG Take 1 tablet 2 (two) times daily by mouth. 60 tablet 1   No current facility-administered medications for this visit.     Allergies:   Patient has no known allergies.    Social History:  The patient  reports that he quit smoking about 29 years ago. His smoking use included cigarettes. He started smoking about 49 years ago. he has never used smokeless tobacco. He reports that he does not drink alcohol or use drugs.   Family History:  The patient's family history includes Coronary artery disease in his father; Emphysema in his father; Heart attack in his mother.    ROS: All other systems are reviewed and negative. Unless otherwise mentioned in H&P    PHYSICAL EXAM: VS:  BP 108/74   Pulse 64   Ht 5\' 11"  (1.803 m)   Wt 185 lb (83.9 kg)   SpO2 98%   BMI 25.80 kg/m  , BMI Body mass index is 25.8 kg/m. GEN: Well nourished, well developed, in no acute distress  HEENT: normal  Neck: no JVD, carotid bruits, or masses Cardiac: RRR diastolic murmur, soft systolic murmur,; no murmurs, rubs, or gallops,no edema  Respiratory: Tori expiratory wheezes with rales, frequent productive coughing.  Dull in the right base. GI: soft, nontender, nondistended, + BS MS: no deformity or atrophy  Skin: warm and dry, no rash Neuro:  Strength and sensation are intact Psych: euthymic mood, full affect  Recent Labs: 10/28/2017: ALT 32; B Natriuretic Peptide 432.0; BUN 14; Creatinine, Ser 1.37; Hemoglobin 13.6; Platelets 206; Potassium 4.0; Sodium 139    Lipid Panel    Component Value Date/Time   CHOL  03/08/2010 0411    81        ATP III CLASSIFICATION:  <200     mg/dL   Desirable  161-096  mg/dL   Borderline High  >=045    mg/dL   High          TRIG 50 03/08/2010 0411   HDL 25 (L) 03/08/2010 0411     CHOLHDL 3.2 03/08/2010 0411   VLDL 10 03/08/2010 0411   LDLCALC  03/08/2010 0411    46        Total Cholesterol/HDL:CHD Risk Coronary Heart Disease Risk Table                     Men   Women  1/2 Average Risk   3.4   3.3  Average Risk       5.0   4.4  2 X Average Risk   9.6   7.1  3 X Average Risk  23.4   11.0        Use the calculated Patient Ratio above and the CHD Risk Table to determine the patient's CHD Risk.        ATP III CLASSIFICATION (LDL):  <100     mg/dL   Optimal  811-914  mg/dL   Near or Above                    Optimal  130-159  mg/dL   Borderline  782-956  mg/dL   High  >213     mg/dL   Very High      Wt Readings from Last 3 Encounters:  11/01/17 185 lb (83.9 kg)  10/28/17 183 lb (83 kg)  10/03/17 192 lb (87.1 kg)      Other studies Reviewed: Echocardiogram 04-Feb-2017 Left ventricle: The cavity size was mildly dilated. Systolic function was severely reduced. The estimated ejection fraction was in the range of 25% to 30%. There is akinesis of the mid-apicalanteroseptal myocardium. There is akinesis of the basal-midinferoseptal myocardium. There is severe hypokinesis of the entireinferior myocardium. There was a reduced contribution of atrial contraction to ventricular filling, due to increased ventricular diastolic pressure or atrial contractile dysfunction. Doppler parameters are consistent with a reversible restrictive pattern, indicative of decreased left ventricular diastolic compliance and/or increased left atrial pressure (grade 3 diastolic dysfunction). - Mitral valve: There was mild regurgitation.   ASSESSMENT AND PLAN:  1.  Combined chronic systolic and diastolic CHF: Weight is back to baseline, no evidence of fluid overload currently, but he does continue to eat salty foods some time and use a "light salt".  I have educated him on his heart function weakness, and how salted foods or addition of salt can cause  significant fluid overload quickly.  States that his Lasix does not appear to be giving him very good response as of late.  When in the ER he was asked to double his Lasix dose until seen by cardiology.  I am going to discontinue Lasix 40 mg daily and begin torsemide 40 mg daily for better bioavailability.  Continue potassium replacement.  He is given samples of Entresto 49 mg / 51 mg which he is taking twice daily.  He will continue metoprolol 50 mg twice daily. Aloe up BMET will be completed by his primary care physician Dr. Lysbeth Galas on December 12.  I am requesting results Cardiogram will be ordered to evaluate for worsening mitral valve disease along with reevaluation of LV function.  Benefit from evaluation with [facility].   2.  Probable pneumonia: Acute wheezing, rales, productive yellow phlegm with self reported fever at home some chills but no rigors.  Feeding PA lateral chest x-ray with decubitus films due to dullness in the right base.  Most recent chest x-ray did show a small pleural effusion.  I am going to begin Medrol Dosepak over 7 days, Levaquin 500 mg p.o. twice daily for 10 days, he will continue over-the-counter Mucinex for congestion.  He will follow-up with his primary care physician for ongoing assessment and management.  He is given a letter to not return to work until his next shift on December 18 to allow for recovery.  3.  Coronary artery disease: Hx of coronary artery bypass grafting.  Continue aspirin, statin therapy, beta-blocker therapy along with Entresto.  He offers no complaints of chest pain except for when  he coughs.  4.  ICD in situ: Recently interrogated 2 weeks ago.  He is not complained of any discharges.  Current medicines are reviewed at length with the patient today.    Labs/ tests ordered today include: Echocardiogram, PA and lateral chest x-ray with decubitus films, (lab to be requested from PCP on December 12 visit)  Bettey MareKathryn M. Liborio NixonLawrence DNP, ANP,  AACC   11/01/2017 1:56 PM    [facility] [address], [place], [place] [address] Phone: [phone]; Fax: [phone]

## 2017-11-01 ENCOUNTER — Encounter: Payer: Self-pay | Admitting: *Deleted

## 2017-11-01 ENCOUNTER — Ambulatory Visit (INDEPENDENT_AMBULATORY_CARE_PROVIDER_SITE_OTHER): Payer: Medicare HMO | Admitting: Adult Health

## 2017-11-01 ENCOUNTER — Encounter: Payer: Self-pay | Admitting: Adult Health

## 2017-11-01 ENCOUNTER — Ambulatory Visit (HOSPITAL_COMMUNITY)
Admission: RE | Admit: 2017-11-01 | Discharge: 2017-11-01 | Disposition: A | Discharge status: Home / Self Care | Payer: Medicare HMO | Source: Ambulatory Visit | Attending: Adult Health | Admitting: Adult Health

## 2017-11-01 ENCOUNTER — Other Ambulatory Visit: Payer: Self-pay | Admitting: Adult Health

## 2017-11-01 VITALS — BP 108/74 | HR 64 | Ht 71.0 in | Wt 185.0 lb

## 2017-11-01 DIAGNOSIS — J918 Pleural effusion in other conditions classified elsewhere: Secondary | ICD-10-CM

## 2017-11-01 DIAGNOSIS — I255 Ischemic cardiomyopathy: Secondary | ICD-10-CM | POA: Diagnosis not present

## 2017-11-01 DIAGNOSIS — I2510 Atherosclerotic heart disease of native coronary artery without angina pectoris: Secondary | ICD-10-CM | POA: Diagnosis not present

## 2017-11-01 DIAGNOSIS — I5021 Acute systolic (congestive) heart failure: Secondary | ICD-10-CM

## 2017-11-01 DIAGNOSIS — J90 Pleural effusion, not elsewhere classified: Secondary | ICD-10-CM | POA: Diagnosis not present

## 2017-11-01 DIAGNOSIS — J189 Pneumonia, unspecified organism: Secondary | ICD-10-CM

## 2017-11-01 MED ORDER — PREDNISONE 10 MG (21) PO TBPK: ORAL_TABLET | ORAL | 0 refills | Status: DC

## 2017-11-01 MED ORDER — TORSEMIDE 20 MG PO TABS: 40.0000 mg | ORAL_TABLET | Freq: Every day | ORAL | 3 refills | Status: DC

## 2017-11-01 MED ORDER — LEVOFLOXACIN 500 MG PO TABS: 500.0000 mg | ORAL_TABLET | Freq: Two times a day (BID) | ORAL | 0 refills | Status: DC

## 2017-11-01 NOTE — Patient Instructions (Signed)
Medication Instructions:  Your physician has recommended you make the following change in your medication: STOP Taking Lasix  Start Taking Torsemide 40 mg Daily  Start Levaquin 500 mg Two Times Daily  Start Prednisone Dose Pak    Labwork: NONE   Testing/Procedures: A chest x-ray takes a picture of the organs and structures inside the chest, including the heart, lungs, and blood vessels. This test can show several things, including, whether the heart is enlarges; whether fluid is building up in the lungs; and whether pacemaker / defibrillator leads are still in place.  Your physician has requested that you have an echocardiogram. Echocardiography is a painless test that uses sound waves to create images of your heart. It provides your doctor with information about the size and shape of your heart and how well your heart's chambers and valves are working. This procedure takes approximately one hour. There are no restrictions for this procedure.    Follow-Up: Your physician recommends that you schedule a follow-up appointment in: 2-3 Weeks    Any Other Special Instructions Will Be Listed Below (If Applicable).     If you need a refill on your cardiac medications before your next appointment, please call your pharmacy.

## 2017-11-02 ENCOUNTER — Ambulatory Visit (HOSPITAL_COMMUNITY)
Admission: RE | Admit: 2017-11-02 | Discharge: 2017-11-02 | Disposition: A | Discharge status: Home / Self Care | Payer: Medicare HMO | Source: Ambulatory Visit | Attending: Adult Health | Admitting: Adult Health

## 2017-11-02 DIAGNOSIS — I2510 Atherosclerotic heart disease of native coronary artery without angina pectoris: Secondary | ICD-10-CM | POA: Insufficient documentation

## 2017-11-02 DIAGNOSIS — I110 Hypertensive heart disease with heart failure: Secondary | ICD-10-CM | POA: Insufficient documentation

## 2017-11-02 DIAGNOSIS — E785 Hyperlipidemia, unspecified: Secondary | ICD-10-CM | POA: Diagnosis not present

## 2017-11-02 DIAGNOSIS — Z951 Presence of aortocoronary bypass graft: Secondary | ICD-10-CM | POA: Insufficient documentation

## 2017-11-02 DIAGNOSIS — Z95810 Presence of automatic (implantable) cardiac defibrillator: Secondary | ICD-10-CM | POA: Diagnosis not present

## 2017-11-02 DIAGNOSIS — I5021 Acute systolic (congestive) heart failure: Secondary | ICD-10-CM | POA: Insufficient documentation

## 2017-11-02 LAB — ECHOCARDIOGRAM COMPLETE
AVLVOTPG: 4 mmHg
E decel time: 313 msec
E/e' ratio: 10.53
FS: 21 % — AB (ref 28–44)
IVS/LV PW RATIO, ED: 0.81
LA ID, A-P, ES: 50 mm
LA diam end sys: 50 mm
LA diam index: 2.43 cm/m2
LA vol A4C: 71.8 ml
LAVOL: 65.8 mL
LAVOLIN: 31.9 mL/m2
LDCA: 3.14 cm2
LV E/e'average: 10.53
LV PW d: 13 mm — AB (ref 0.6–1.1)
LV TDI E'MEDIAL: 6.74
LV dias vol index: 68 mL/m2
LV e' LATERAL: 7.72 cm/s
LVDIAVOL: 141 mL (ref 62–150)
LVEEMED: 10.53
LVOT SV: 69 mL
LVOT VTI: 21.9 cm
LVOTD: 20 mm
LVOTPV: 95.9 cm/s
LVSYSVOL: 81 mL — AB (ref 21–61)
LVSYSVOLIN: 39 mL/m2
MV Dec: 313
MV pk E vel: 81.3 m/s
MVPG: 3 mmHg
MVPKAVEL: 67.5 m/s
PISA EROA: 0.07 cm2
RV LATERAL S' VELOCITY: 9.36 cm/s
RV TAPSE: 15.2 mm
Reg peak vel: 204 cm/s
Simpson's disk: 43
Stroke v: 60 ml
TDI e' lateral: 7.72
TRMAXVEL: 204 cm/s
VTI: 158 cm

## 2017-11-02 NOTE — Progress Notes (Signed)
*  PRELIMINARY RESULTS* Echocardiogram 2D Echocardiogram has been performed.  Stacey Drain 11/02/2017, 3:09 PM

## 2017-11-07 DIAGNOSIS — K219 Gastro-esophageal reflux disease without esophagitis: Secondary | ICD-10-CM | POA: Diagnosis not present

## 2017-11-07 DIAGNOSIS — Z0000 Encounter for general adult medical examination without abnormal findings: Secondary | ICD-10-CM | POA: Diagnosis not present

## 2017-11-07 DIAGNOSIS — E785 Hyperlipidemia, unspecified: Secondary | ICD-10-CM | POA: Diagnosis not present

## 2017-11-07 DIAGNOSIS — I509 Heart failure, unspecified: Secondary | ICD-10-CM | POA: Diagnosis not present

## 2017-11-07 DIAGNOSIS — I10 Essential (primary) hypertension: Secondary | ICD-10-CM | POA: Diagnosis not present

## 2017-11-07 DIAGNOSIS — I429 Cardiomyopathy, unspecified: Secondary | ICD-10-CM | POA: Diagnosis not present

## 2017-11-07 DIAGNOSIS — I2510 Atherosclerotic heart disease of native coronary artery without angina pectoris: Secondary | ICD-10-CM | POA: Diagnosis not present

## 2017-11-14 DIAGNOSIS — E509 Vitamin A deficiency, unspecified: Secondary | ICD-10-CM | POA: Diagnosis not present

## 2017-11-14 DIAGNOSIS — I429 Cardiomyopathy, unspecified: Secondary | ICD-10-CM | POA: Diagnosis not present

## 2017-11-14 DIAGNOSIS — I10 Essential (primary) hypertension: Secondary | ICD-10-CM | POA: Diagnosis not present

## 2017-11-14 DIAGNOSIS — E785 Hyperlipidemia, unspecified: Secondary | ICD-10-CM | POA: Diagnosis not present

## 2017-11-14 DIAGNOSIS — K219 Gastro-esophageal reflux disease without esophagitis: Secondary | ICD-10-CM | POA: Diagnosis not present

## 2017-11-19 NOTE — Progress Notes (Signed)
Cardiology Office Note   Date:  11/22/2017   ID:  Joseph [name], DOB 01/31/1956, MRN [id number]  PCP:  Joette CatchingNyland, Leonard, MD  Cardiologist: Rollene RotundaJames Hochrein, MD Chief Complaint  Patient presents with  . Follow-up     History of Present Illness: [name] is a 61 y.o. male who presents for ongoing assessment and management of coronary artery disease, hemic cardiomyopathy, reduced EF of 25% to 30%, status post ICD defibrillator, St.Jude placed 2011, hyperlipidemia, ongoing tobacco abuse, with history of GI bleed with Mallory-Weiss tear in 2011.  On last office visit the patient had increasing shortness of breath, coughing up yellowish phlegm, chest x-ray did not reveal CHF or pneumonia.  Pete echocardiogram was ordered.  This revealed mild improvement in LVEF.  Left ventricle: The cavity size was moderately dilated. There was   mild concentric hypertrophy. Systolic function was moderately   reduced. The estimated ejection fraction was in the range of 35%   to 40%. Diffuse hypokinesis. Features are consistent with a   pseudonormal left ventricular filling pattern, with concomitant   abnormal relaxation and increased filling pressure (grade 2   diastolic dysfunction). Indeterminate filling pressures. - Regional wall motion abnormality: Akinesis of the mid anterior,   mid anteroseptal, and mid inferoseptal myocardium; severe   hypokinesis of the basal anteroseptal and mid inferior   myocardium; mild hypokinesis of the mid anterolateral myocardium. - Mitral valve: There was mild to moderate regurgitation. - Left atrium: The atrium was mildly dilated. - Right ventricle: Pacer wire or catheter noted in right ventricle.   Systolic function was mildly to moderately reduced. - Right atrium: Pacer wire or catheter noted in right atrium. - Atrial septum: No defect or patent foramen ovale was identified. - Tricuspid valve: There was mild regurgitation.  He was treated for bronchitis with  steroids, frequent 500 mg twice daily for 10 days, continue over-the-counter Mucinex for congestion, he was advised on smoking cessation.  He was also changed on diuretic from Lasix to torsemide 40 mg daily for better bioavailability, he was continued on potassium, and given samples of Entresto 49/51 which she is to take twice daily.  He was to continue to take metoprolol 50 mg twice daily.  He comes today feeling significantly better.  Breathing status has improved, no more wheezing, no more dyspnea, edema has been eliminated.  His weight is down at home to 178 pounds.  He has returned to work earlier than he expected.  No further coughing or congestion.  Past Medical History:  Diagnosis Date  . CHF (congestive heart failure) (HCC)    EF 20%  . Coronary artery disease   . GI bleed    Mallory-Weiss tear EGD 02/2010  . Hyperlipidemia   . Ileus (HCC) 2006   required hospitalization  . Left ventricular dysfunction    apical thrombus  . MRSA bacteremia   . 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 Jude  . Small bowel obstruction (HCC) 2007   resolved without surgery    Past Surgical History:  Procedure Laterality Date  . basilic vein left arm resection    . CARDIAC DEFIBRILLATOR PLACEMENT    . CORONARY ARTERY BYPASS GRAFT     6 vessels, 2000  . INSERT / REPLACE / REMOVE PACEMAKER  01/05/10   ICD insertion/St. Jude single chamber     Current Outpatient Medications  Medication Sig Dispense Refill  . acetaminophen (TYLENOL) 500 MG tablet Take 500 mg by mouth every  6 (six) hours as needed for pain.    Marland Kitchen albuterol (PROVENTIL HFA;VENTOLIN HFA) 108 (90 Base) MCG/ACT inhaler Inhale 2 puffs into the lungs every 4 (four) hours as needed. 6.7 g 0  . aspirin 81 MG tablet Take 81 mg by mouth at bedtime.     Marland Kitchen atorvastatin (LIPITOR) 80 MG tablet Take 80 mg daily by mouth.    . dextromethorphan-guaiFENesin (MUCINEX DM) 30-600 MG per 12 hr tablet Take 1  tablet by mouth 2 (two) times daily as needed for cough.    . fish oil-omega-3 fatty acids 1000 MG capsule Take 1 g by mouth 3 (three) times daily.     Marland Kitchen KLOR-CON M20 20 MEQ tablet TAKE 1 TABLET (20 MEQ TOTAL) BY MOUTH DAILY. 90 tablet 3  . levofloxacin (LEVAQUIN) 500 MG tablet Take 1 tablet (500 mg total) by mouth 2 (two) times daily. 20 tablet 0  . metoprolol tartrate (LOPRESSOR) 50 MG tablet Take 50 mg 3 (three) times daily after meals by mouth.     . nitroGLYCERIN (NITROSTAT) 0.4 MG SL tablet Place 1 tablet (0.4 mg total) under the tongue every 5 (five) minutes as needed for chest pain. 25 tablet 3  . omeprazole (PRILOSEC) 20 MG capsule TAKE 1 CAPSULE (20 MG TOTAL) BY MOUTH DAILY. 90 capsule 2  . sacubitril-valsartan (ENTRESTO) 49-51 MG Take 1 tablet 2 (two) times daily by mouth. 60 tablet 1  . torsemide (DEMADEX) 20 MG tablet Take 2 tablets (40 mg total) by mouth daily. 180 tablet 3   No current facility-administered medications for this visit.     Allergies:   Patient has no known allergies.    Social History:  The patient  reports that he quit smoking about 29 years ago. His smoking use included cigarettes. He started smoking about 50 years ago. he has never used smokeless tobacco. He reports that he does not drink alcohol or use drugs.   Family History:  The patient's family history includes Coronary artery disease in his father; Emphysema in his father; Heart attack in his mother.    ROS: All other systems are reviewed and negative. Unless otherwise mentioned in H&P    PHYSICAL EXAM: VS:  BP 120/78   Pulse 68   Ht 5\' 11"  (1.803 m)   Wt 190 lb (86.2 kg)   SpO2 98%   BMI 26.50 kg/m  , BMI Body mass index is 26.5 kg/m. GEN: Well nourished, well developed, in no acute distress  HEENT: normal  Neck: no JVD, carotid bruits, or masses Cardiac: RRR; no murmurs, rubs, or gallops,no edema  Respiratory:  Clear to auscultation bilaterally, normal work of breathing GI: soft,  nontender, nondistended, + BS MS: no deformity or atrophy  Skin: warm and dry, no rash Neuro:  Strength and sensation are intact Psych: euthymic mood, full affect   Recent Labs: 10/28/2017: ALT 32; B Natriuretic Peptide 432.0; BUN 14; Creatinine, Ser 1.37; Hemoglobin 13.6; Platelets 206; Potassium 4.0; Sodium 139    Lipid Panel    Component Value Date/Time   CHOL  03/08/2010 0411    81        ATP III CLASSIFICATION:  <200     mg/dL   Desirable  638-466  mg/dL   Borderline High  >=599    mg/dL   High          TRIG 50 03/08/2010 0411   HDL 25 (L) 03/08/2010 0411   CHOLHDL 3.2 03/08/2010 0411   VLDL 10 03/08/2010 0411  LDLCALC  03/08/2010 0411    46        Total Cholesterol/HDL:CHD Risk Coronary Heart Disease Risk Table                     Men   Women  1/2 Average Risk   3.4   3.3  Average Risk       5.0   4.4  2 X Average Risk   9.6   7.1  3 X Average Risk  23.4   11.0        Use the calculated Patient Ratio above and the CHD Risk Table to determine the patient's CHD Risk.        ATP III CLASSIFICATION (LDL):  <100     mg/dL   Optimal  098-119  mg/dL   Near or Above                    Optimal  130-159  mg/dL   Borderline  147-829  mg/dL   High  >562     mg/dL   Very High      Wt Readings from Last 3 Encounters:  11/22/17 190 lb (86.2 kg)  11/01/17 185 lb (83.9 kg)  10/28/17 183 lb (83 kg)     ASSESSMENT AND PLAN:  1. HFrEF: Patient has diuresed, weight is down: 10 pounds at home.  Breathing status has improved.  Repeat echocardiogram reveals improvement in LV systolic function to 35-40%.  The patient will continue on torsemide which is new, and no longer be on Lasix.  He will continue Entresto.  I have reviewed his labs which he has brought with him from Dr. Joyce Copa office.  Creatinine 1.08, potassium 4.3.  We will not make any other changes in his medications and continue him on current regimen.  2.  Bronchitis: Resolved with antibiotics and steroid  therapy.  3.  Coronary artery disease: Known history of coronary artery bypass grafting.  He is to continue metoprolol, aspirin, and statin therapy.  Current medicines are reviewed at length with the patient today.    Labs/ tests ordered today include:   Bettey Mare. Liborio Nixon, ANP, AACC   11/22/2017 3:16 PM    [facility] [address] [place], [place] [address] Phone: [phone]; Fax: [phone]

## 2017-11-22 ENCOUNTER — Ambulatory Visit (INDEPENDENT_AMBULATORY_CARE_PROVIDER_SITE_OTHER): Payer: Medicare HMO | Admitting: Adult Health

## 2017-11-22 ENCOUNTER — Encounter: Payer: Self-pay | Admitting: Adult Health

## 2017-11-22 VITALS — BP 120/78 | HR 68 | Ht 71.0 in | Wt 190.0 lb

## 2017-11-22 DIAGNOSIS — I5021 Acute systolic (congestive) heart failure: Secondary | ICD-10-CM | POA: Diagnosis not present

## 2017-11-22 DIAGNOSIS — I2510 Atherosclerotic heart disease of native coronary artery without angina pectoris: Secondary | ICD-10-CM | POA: Diagnosis not present

## 2017-11-22 NOTE — Patient Instructions (Signed)
Medication Instructions:  Your physician recommends that you continue on your current medications as directed. Please refer to the Current Medication list given to you today.   Labwork: NONE   Testing/Procedures: NONE   Follow-Up: Your physician recommends that you schedule a follow-up appointment with Dr. Antoine Poche   Any Other Special Instructions Will Be Listed Below (If Applicable).     If you need a refill on your cardiac medications before your next appointment, please call your pharmacy. Thank you for choosing [facility]!

## 2017-12-04 DIAGNOSIS — I509 Heart failure, unspecified: Secondary | ICD-10-CM | POA: Diagnosis not present

## 2017-12-04 DIAGNOSIS — K219 Gastro-esophageal reflux disease without esophagitis: Secondary | ICD-10-CM | POA: Diagnosis not present

## 2017-12-04 DIAGNOSIS — I10 Essential (primary) hypertension: Secondary | ICD-10-CM | POA: Diagnosis not present

## 2017-12-04 DIAGNOSIS — E1159 Type 2 diabetes mellitus with other circulatory complications: Secondary | ICD-10-CM | POA: Diagnosis not present

## 2017-12-04 DIAGNOSIS — I429 Cardiomyopathy, unspecified: Secondary | ICD-10-CM | POA: Diagnosis not present

## 2017-12-04 DIAGNOSIS — E785 Hyperlipidemia, unspecified: Secondary | ICD-10-CM | POA: Diagnosis not present

## 2017-12-05 DIAGNOSIS — I509 Heart failure, unspecified: Secondary | ICD-10-CM | POA: Diagnosis not present

## 2017-12-05 DIAGNOSIS — K219 Gastro-esophageal reflux disease without esophagitis: Secondary | ICD-10-CM | POA: Diagnosis not present

## 2017-12-05 DIAGNOSIS — E1159 Type 2 diabetes mellitus with other circulatory complications: Secondary | ICD-10-CM | POA: Diagnosis not present

## 2017-12-05 DIAGNOSIS — I429 Cardiomyopathy, unspecified: Secondary | ICD-10-CM | POA: Diagnosis not present

## 2017-12-05 DIAGNOSIS — E785 Hyperlipidemia, unspecified: Secondary | ICD-10-CM | POA: Diagnosis not present

## 2017-12-05 DIAGNOSIS — I10 Essential (primary) hypertension: Secondary | ICD-10-CM | POA: Diagnosis not present

## 2017-12-05 DIAGNOSIS — E119 Type 2 diabetes mellitus without complications: Secondary | ICD-10-CM | POA: Diagnosis not present

## 2017-12-31 ENCOUNTER — Ambulatory Visit (INDEPENDENT_AMBULATORY_CARE_PROVIDER_SITE_OTHER): Payer: Medicare HMO | Admitting: *Deleted

## 2017-12-31 ENCOUNTER — Telehealth: Payer: Self-pay | Admitting: Cardiology

## 2017-12-31 DIAGNOSIS — I255 Ischemic cardiomyopathy: Secondary | ICD-10-CM | POA: Diagnosis not present

## 2017-12-31 NOTE — Telephone Encounter (Signed)
Spoke with pt and reminded pt of remote transmission that is due today. Pt verbalized understanding.   

## 2018-01-01 ENCOUNTER — Encounter: Payer: Self-pay | Admitting: Cardiology

## 2018-01-01 NOTE — Progress Notes (Signed)
Remote ICD transmission.   

## 2018-01-08 NOTE — Progress Notes (Signed)
HPI The patient returns for followup of coronary disease and cardiomyopathy.  In early Dec he had SOB and was treated with diuresis.  He was changed to Torsemide.  He was also treated with bronchitis.  His follow up EF was slightly improved to 30 - 35%.   He feels better since then.  I did review the emergency room records from December prior to that.  His BNP was elevated at that visit.  He was treated with some albuterol and Lasix.  In our office he was treated with antibiotics.  He is back to his baseline.  His weight came down although he is up a few pounds in the last couple of days but without swelling or change in his diet.  He is not having any PND or orthopnea.  Is not having any chest pressure, neck or arm discomfort.  He has had no palpitations, presyncope or syncope.   No Known Allergies  Current Outpatient Medications  Medication Sig Dispense Refill  . acetaminophen (TYLENOL) 500 MG tablet Take 500 mg by mouth every 6 (six) hours as needed for pain.    Marland Kitchen albuterol (PROVENTIL HFA;VENTOLIN HFA) 108 (90 Base) MCG/ACT inhaler Inhale 2 puffs into the lungs every 4 (four) hours as needed. 6.7 g 0  . aspirin 81 MG tablet Take 81 mg by mouth at bedtime.     Marland Kitchen atorvastatin (LIPITOR) 80 MG tablet Take 80 mg daily by mouth.    . dextromethorphan-guaiFENesin (MUCINEX DM) 30-600 MG per 12 hr tablet Take 1 tablet by mouth 2 (two) times daily as needed for cough.    . fish oil-omega-3 fatty acids 1000 MG capsule Take 1 g by mouth 3 (three) times daily.     Marland Kitchen KLOR-CON M20 20 MEQ tablet TAKE 1 TABLET (20 MEQ TOTAL) BY MOUTH DAILY. 90 tablet 3  . metoprolol tartrate (LOPRESSOR) 50 MG tablet Take 50 mg 3 (three) times daily after meals by mouth.     . nitroGLYCERIN (NITROSTAT) 0.4 MG SL tablet Place 1 tablet (0.4 mg total) under the tongue every 5 (five) minutes as needed for chest pain. 25 tablet 3  . omeprazole (PRILOSEC) 20 MG capsule TAKE 1 CAPSULE (20 MG TOTAL) BY MOUTH DAILY. 90 capsule 2  .  torsemide (DEMADEX) 20 MG tablet Take 2 tablets (40 mg total) by mouth daily. 180 tablet 3  . sacubitril-valsartan (ENTRESTO) 97-103 MG Take 1 tablet by mouth 2 (two) times daily. 60 tablet 11   No current facility-administered medications for this visit.   Is  Past Medical History:  Diagnosis Date  . CHF (congestive heart failure) (HCC)    EF 20%  . Coronary artery disease   . GI bleed    Mallory-Weiss tear EGD 02/2010  . Hyperlipidemia   . Ileus (HCC) 2006   required hospitalization  . Left ventricular dysfunction    apical thrombus  . MRSA bacteremia   . 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 Jude  . Small bowel obstruction (HCC) 2007   resolved without surgery    Past Surgical History:  Procedure Laterality Date  . basilic vein left arm resection    . CARDIAC DEFIBRILLATOR PLACEMENT    . CORONARY ARTERY BYPASS GRAFT     6 vessels, 2000  . INSERT / REPLACE / REMOVE PACEMAKER  01/05/10   ICD insertion/St. Jude single chamber    ROS   As stated in the HPI and negative for all other  systems.   PHYSICAL EXAM BP 134/84   Pulse 62   Ht 5\' 11"  (1.803 m)   Wt 197 lb (89.4 kg)   BMI 27.48 kg/m   GENERAL:  Well appearing HEENT: Poor dentition NECK:  No jugular venous distention, waveform within normal limits, carotid upstroke brisk and symmetric, no bruits, no thyromegaly LUNGS:  Clear to auscultation bilaterally CHEST:  ICD scar HEART:  PMI not displaced or sustained,S1 and S2 within normal limits, no S3, no S4, no clicks, no rubs, soft apical early peaking systolic murmur nonradiating murmurs ABD:  Flat, positive bowel sounds normal in frequency in pitch, no bruits, no rebound, no guarding, no midline pulsatile mass, no hepatomegaly, no splenomegaly EXT:  2 plus pulses throughout, trace bilateral edema, no cyanosis no clubbing   GENERAL:  Well appearing, poor dentition.  NECK:  No jugular venous distention, waveform  within normal limits, carotid upstroke brisk and symmetric, no bruits, no thyromegaly LUNGS:  Clear to auscultation bilaterally CHEST:  Well healed ICD scar, Well healed sternotomy scar.  HEART:  PMI not displaced or sustained,S1 and S2 within normal limits, no S3, no S4, no clicks, no rubs, soft apical early peaking systolic murmur nonradiating, no diastolic murmurs ABD:  Flat, positive bowel sounds normal in frequency in pitch, no bruits, no rebound, no guarding, no midline pulsatile mass, no hepatomegaly, no splenomegaly EXT:  2 plus pulses throughout, mild right greater than left leg edema, no cyanosis no clubbing    EKG: NA  ASSESSMENT AND PLAN  Coronary artery disease -  The patient has no new sypmtoms.  No further cardiovascular testing is indicated.  We will continue with aggressive risk reduction and meds as listed.  CARDIOMYOPATHY -  EF was 35 - 40%.  Today I am going to titrate his Entresto to the 97/103 dose and I will check a basic metabolic profile in 1 week.    ICD - He is up-to-date with follow-up.  DYSLIPIDEMIA - His LDL last month was 78 with an HDL of 49.  He will continue the meds as listed.  DM - His A1c last month was 6.6.  He is having this managed by Joette Catching, MD   CKD - Creatinine most recently was 1.33.  I will follow this closely as we titrate his meds.

## 2018-01-09 ENCOUNTER — Ambulatory Visit: Payer: Medicare HMO | Admitting: Cardiology

## 2018-01-09 ENCOUNTER — Encounter: Payer: Self-pay | Admitting: Cardiology

## 2018-01-09 VITALS — BP 134/84 | HR 62 | Ht 71.0 in | Wt 197.0 lb

## 2018-01-09 DIAGNOSIS — N183 Chronic kidney disease, stage 3 unspecified: Secondary | ICD-10-CM

## 2018-01-09 DIAGNOSIS — I10 Essential (primary) hypertension: Secondary | ICD-10-CM

## 2018-01-09 DIAGNOSIS — I2510 Atherosclerotic heart disease of native coronary artery without angina pectoris: Secondary | ICD-10-CM

## 2018-01-09 DIAGNOSIS — I5022 Chronic systolic (congestive) heart failure: Secondary | ICD-10-CM | POA: Diagnosis not present

## 2018-01-09 DIAGNOSIS — E785 Hyperlipidemia, unspecified: Secondary | ICD-10-CM | POA: Diagnosis not present

## 2018-01-09 MED ORDER — SACUBITRIL-VALSARTAN 97-103 MG PO TABS: 1.0000 | ORAL_TABLET | Freq: Two times a day (BID) | ORAL | 11 refills | Status: DC

## 2018-01-09 NOTE — Patient Instructions (Signed)
Medication Instructions:  Increase Entresto to 97-103 mg twice a day. Continue all other medications as listed.  Labwork: Please have blood work in 1 week (BMP) with results faxed to Dr Antoine Poche at [phone].  Follow-Up: Follow up in 1 month with Dr Antoine Poche.  If you need a refill on your cardiac medications before your next appointment, please call your pharmacy.  Thank you for choosing [facility]!!

## 2018-01-22 ENCOUNTER — Telehealth: Payer: Self-pay | Admitting: Cardiology

## 2018-01-22 NOTE — Telephone Encounter (Signed)
Results of labwork. Patient has had 8 lb weight gain over 2 week period. Please advise of any instructions. / tg

## 2018-01-22 NOTE — Telephone Encounter (Signed)
Pt aware to take extra Torsemide 20 mg for 2 days.

## 2018-01-22 NOTE — Telephone Encounter (Signed)
Tell him to take an extra 20 mg Torsemide PO x 1 for the next two days.  Thanks.

## 2018-01-22 NOTE — Telephone Encounter (Signed)
Spoke with pt who is reporting 8 lb wt gain in 2 to 3 weeks.  He denies any increase in SOB and no more "swelling than normal."  He is currently taking Torsemide 40 mg a day.  Last Crea 01/18/18 was 1.35 at Dr Joyce Copa office.  Advised to continue to monitor weights daily, limit NA+ intake to1,500 mg a day and limit fluid intake to 1,500 cc/day.  Advised I will forward this information to Dr Antoine Poche for review and c/b with any new orders.  Pt states understanding.

## 2018-01-26 LAB — CUP PACEART REMOTE DEVICE CHECK
Battery Remaining Longevity: 44 mo
Brady Statistic RV Percent Paced: 1 %
Date Time Interrogation Session: 20190205081335
HIGH POWER IMPEDANCE MEASURED VALUE: 42 Ohm
Implantable Lead Implant Date: 20110209
Implantable Lead Location: 753860
Implantable Lead Model: 7120
Lead Channel Impedance Value: 480 Ohm
Lead Channel Pacing Threshold Amplitude: 0.75 V
Lead Channel Pacing Threshold Pulse Width: 0.4 ms
Lead Channel Sensing Intrinsic Amplitude: 12 mV
Lead Channel Setting Pacing Pulse Width: 0.4 ms
MDC IDC MSMT BATTERY REMAINING PERCENTAGE: 38 %
MDC IDC MSMT BATTERY VOLTAGE: 2.9 V
MDC IDC PG IMPLANT DT: 20110209
MDC IDC PG SERIAL: 747960
MDC IDC SET LEADCHNL RV PACING AMPLITUDE: 2.5 V
MDC IDC SET LEADCHNL RV SENSING SENSITIVITY: 0.5 mV

## 2018-01-28 ENCOUNTER — Other Ambulatory Visit: Payer: Self-pay | Admitting: Cardiology

## 2018-01-30 ENCOUNTER — Ambulatory Visit: Payer: Medicare HMO | Admitting: Cardiology

## 2018-02-01 DIAGNOSIS — D649 Anemia, unspecified: Secondary | ICD-10-CM | POA: Diagnosis not present

## 2018-02-08 DIAGNOSIS — H5200 Hypermetropia, unspecified eye: Secondary | ICD-10-CM | POA: Diagnosis not present

## 2018-02-08 DIAGNOSIS — I10 Essential (primary) hypertension: Secondary | ICD-10-CM | POA: Diagnosis not present

## 2018-02-13 ENCOUNTER — Ambulatory Visit: Payer: Medicare HMO | Admitting: Cardiology

## 2018-02-13 ENCOUNTER — Other Ambulatory Visit: Payer: Self-pay | Admitting: Cardiology

## 2018-03-03 NOTE — Progress Notes (Signed)
HPI The patient returns for followup of coronary disease and cardiomyopathy.  Since I last saw him he did call with SOB and had to take increased Demadex.  Since then he says he feels well.  He still working part-time at [employer]. The patient denies any new symptoms such as chest discomfort, neck or arm discomfort. There has been no new shortness of breath, PND or orthopnea. There have been no reported palpitations, presyncope or syncope.   His weight is up a few pounds but he does not think he has been eating more and is not been having salt and watches his fluid.    No Known Allergies  Current Outpatient Medications  Medication Sig Dispense Refill  . acetaminophen (TYLENOL) 500 MG tablet Take 500 mg by mouth every 6 (six) hours as needed for pain.    Marland Kitchen albuterol (PROVENTIL HFA;VENTOLIN HFA) 108 (90 Base) MCG/ACT inhaler Inhale 2 puffs into the lungs every 4 (four) hours as needed. 6.7 g 0  . aspirin 81 MG tablet Take 81 mg by mouth at bedtime.     Marland Kitchen atorvastatin (LIPITOR) 80 MG tablet TAKE 1 TABLET BY MOUTH EVERY DAY IN THE EVENING 90 tablet 1  . dextromethorphan-guaiFENesin (MUCINEX DM) 30-600 MG per 12 hr tablet Take 1 tablet by mouth 2 (two) times daily as needed for cough.    . fish oil-omega-3 fatty acids 1000 MG capsule Take 1 g by mouth 3 (three) times daily.     Marland Kitchen KLOR-CON M20 20 MEQ tablet TAKE 1 TABLET (20 MEQ TOTAL) BY MOUTH DAILY. 90 tablet 3  . metoprolol tartrate (LOPRESSOR) 50 MG tablet TAKE 1 TABLET BY MOUTH THREE TIMES A DAY 270 tablet 3  . nitroGLYCERIN (NITROSTAT) 0.4 MG SL tablet Place 1 tablet (0.4 mg total) under the tongue every 5 (five) minutes as needed for chest pain. 25 tablet 3  . omeprazole (PRILOSEC) 20 MG capsule TAKE 1 CAPSULE (20 MG TOTAL) BY MOUTH DAILY. 90 capsule 2  . sacubitril-valsartan (ENTRESTO) 97-103 MG Take 1 tablet by mouth 2 (two) times daily. 60 tablet 11  . torsemide (DEMADEX) 20 MG tablet Take 2 tablets (40 mg total) by mouth daily.  180 tablet 3   No current facility-administered medications for this visit.   Is  Past Medical History:  Diagnosis Date  . CHF (congestive heart failure) (HCC)    EF 20%  . Coronary artery disease   . GI bleed    Mallory-Weiss tear EGD 02/2010  . Hyperlipidemia   . Ileus (HCC) 2006   required hospitalization  . Left ventricular dysfunction    apical thrombus  . MRSA bacteremia   . 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 Jude  . Small bowel obstruction (HCC) 2007   resolved without surgery    Past Surgical History:  Procedure Laterality Date  . basilic vein left arm resection    . CARDIAC DEFIBRILLATOR PLACEMENT    . CORONARY ARTERY BYPASS GRAFT     6 vessels, 2000  . INSERT / REPLACE / REMOVE PACEMAKER  01/05/10   ICD insertion/St. Jude single chamber    ROS   As stated in the HPI and negative for all other systems.   PHYSICAL EXAM BP 128/70   Pulse 60   Ht 5\' 11"  (1.803 m)   Wt 195 lb (88.5 kg)   BMI 27.20 kg/m   GENERAL:  Well appearing NECK:  No jugular venous distention, waveform within  normal limits, carotid upstroke brisk and symmetric, no bruits, no thyromegaly LUNGS:  Clear to auscultation bilaterally CHEST:  Unremarkable HEART:  PMI not displaced or sustained,S1 and S2 within normal limits, no S3, no S4, no clicks, no rubs, soft apical early peaking systolic murmur nonradiating murmurs ABD:  Flat, positive bowel sounds normal in frequency in pitch, no bruits, no rebound, no guarding, no midline pulsatile mass, no hepatomegaly, no splenomegaly EXT:  2 plus pulses throughout, mild bilateral leg edema edema, no cyanosis no clubbing   EKG:   NA   ASSESSMENT AND PLAN  Coronary artery disease -   The patient has no new sypmtoms.  No further cardiovascular testing is indicated.  We will continue with aggressive risk reduction and meds as listed.  CARDIOMYOPATHY -  EF was 35 - 40%.   He is on target  medications.  I am to give him 2 days of 20 mg additional torsemide each day and then back to his previous dose.   ICD - He had follow-up of his device in February and it was normal function and I reviewed the area  DYSLIPIDEMIA - His LDL last month was 82 yesterday.  No change in therapy.  DM - His A1c was 6.2 yesterday    Follow up and plans per  Joette Catching, MD   CKD - Creatinine most recently was 1.39 yesterday.  Continue close follow up.

## 2018-03-05 DIAGNOSIS — E785 Hyperlipidemia, unspecified: Secondary | ICD-10-CM | POA: Diagnosis not present

## 2018-03-05 DIAGNOSIS — E1169 Type 2 diabetes mellitus with other specified complication: Secondary | ICD-10-CM | POA: Diagnosis not present

## 2018-03-05 DIAGNOSIS — E1159 Type 2 diabetes mellitus with other circulatory complications: Secondary | ICD-10-CM | POA: Diagnosis not present

## 2018-03-05 DIAGNOSIS — I10 Essential (primary) hypertension: Secondary | ICD-10-CM | POA: Diagnosis not present

## 2018-03-06 ENCOUNTER — Ambulatory Visit: Payer: Medicare HMO | Admitting: Cardiology

## 2018-03-06 ENCOUNTER — Encounter: Payer: Self-pay | Admitting: Cardiology

## 2018-03-06 VITALS — BP 128/70 | HR 60 | Ht 71.0 in | Wt 195.0 lb

## 2018-03-06 DIAGNOSIS — I5022 Chronic systolic (congestive) heart failure: Secondary | ICD-10-CM

## 2018-03-06 DIAGNOSIS — N183 Chronic kidney disease, stage 3 unspecified: Secondary | ICD-10-CM

## 2018-03-06 DIAGNOSIS — I10 Essential (primary) hypertension: Secondary | ICD-10-CM

## 2018-03-06 NOTE — Patient Instructions (Signed)
Medication Instructions:  Please take an extra Torsemide for the next 2 days then return to your normal dose.  Continue all other medications as listed.  Follow-Up: Follow up in 3 months with Dr Antoine Poche.  If you need a refill on your cardiac medications before your next appointment, please call your pharmacy.  Thank you for choosing [facility]!!

## 2018-03-07 DIAGNOSIS — K219 Gastro-esophageal reflux disease without esophagitis: Secondary | ICD-10-CM | POA: Diagnosis not present

## 2018-03-07 DIAGNOSIS — E785 Hyperlipidemia, unspecified: Secondary | ICD-10-CM | POA: Diagnosis not present

## 2018-03-07 DIAGNOSIS — I10 Essential (primary) hypertension: Secondary | ICD-10-CM | POA: Diagnosis not present

## 2018-03-07 DIAGNOSIS — I429 Cardiomyopathy, unspecified: Secondary | ICD-10-CM | POA: Diagnosis not present

## 2018-03-07 DIAGNOSIS — E1169 Type 2 diabetes mellitus with other specified complication: Secondary | ICD-10-CM | POA: Diagnosis not present

## 2018-03-07 DIAGNOSIS — I509 Heart failure, unspecified: Secondary | ICD-10-CM | POA: Diagnosis not present

## 2018-03-18 ENCOUNTER — Other Ambulatory Visit: Payer: Self-pay | Admitting: Cardiovascular Disease

## 2018-03-18 NOTE — Telephone Encounter (Signed)
Rx request sent to pharmacy.  

## 2018-04-01 ENCOUNTER — Ambulatory Visit (INDEPENDENT_AMBULATORY_CARE_PROVIDER_SITE_OTHER): Payer: Medicare HMO | Admitting: *Deleted

## 2018-04-01 DIAGNOSIS — I255 Ischemic cardiomyopathy: Secondary | ICD-10-CM

## 2018-04-01 NOTE — Progress Notes (Signed)
Remote ICD transmission.   

## 2018-04-03 ENCOUNTER — Encounter: Payer: Self-pay | Admitting: Cardiology

## 2018-04-03 ENCOUNTER — Telehealth: Payer: Self-pay | Admitting: Adult Health

## 2018-04-03 NOTE — Telephone Encounter (Signed)
Pt wife called in as she is worried that he is not peeing enough with the amount of fluid medication he is on. Pt has no c/o at this time of SOB, dizziness, CP, or edema. She stated that pt only drinks one 15.9 ounce bottle of water a day, some days he has 2 bottles but that is it. She stated his appetite is good and he follows his recommend low salt diet. Pt weights himself daily and today's weight was 195, the same as his last OV weight. Educated her that he is possibly not consuming enough fluid so that is why he not peeing a lot. Educated her that he should drink at least 2 of not 3 of those 15.9 oz bottles daily to make sure he is getting enough fluid without over loading his system. Pt takes all medications as prescribed. Wife and pt agreed with plan no additional questions at this time.

## 2018-04-03 NOTE — Telephone Encounter (Signed)
New Message     Pt c/o medication issue:  1. Name of Medication: Entresto  2. How are you currently taking this medication (dosage and times per day)?   3. Are you having a reaction (difficulty breathing--STAT)? No   4. What is your medication issue? Patients wife is calling because she is concerning with the number of times her husband uses the restroom. She indicates its only once a day. Please call

## 2018-04-12 ENCOUNTER — Other Ambulatory Visit: Payer: Self-pay | Admitting: Nurse Practitioner

## 2018-04-12 ENCOUNTER — Encounter: Payer: Self-pay | Admitting: Nurse Practitioner

## 2018-04-12 ENCOUNTER — Ambulatory Visit (INDEPENDENT_AMBULATORY_CARE_PROVIDER_SITE_OTHER): Payer: Worker's Compensation

## 2018-04-12 ENCOUNTER — Ambulatory Visit (INDEPENDENT_AMBULATORY_CARE_PROVIDER_SITE_OTHER): Payer: Worker's Compensation | Admitting: Nurse Practitioner

## 2018-04-12 VITALS — BP 103/68 | HR 70 | Temp 98.4°F | Ht 71.0 in | Wt 192.0 lb

## 2018-04-12 DIAGNOSIS — S99922A Unspecified injury of left foot, initial encounter: Secondary | ICD-10-CM

## 2018-04-12 DIAGNOSIS — S93602A Unspecified sprain of left foot, initial encounter: Secondary | ICD-10-CM | POA: Diagnosis not present

## 2018-04-12 DIAGNOSIS — S99912A Unspecified injury of left ankle, initial encounter: Secondary | ICD-10-CM | POA: Diagnosis not present
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
## 2018-04-12 NOTE — Patient Instructions (Signed)
RICE for Routine Care of Injuries Many injuries can be cared for using rest, ice, compression, and elevation (RICE therapy). Using RICE therapy can help to lessen pain and swelling. It can help your body to heal. Rest Reduce your normal activities and avoid using the injured part of your body. You can go back to your normal activities when you feel okay and your doctor says it is okay. Ice Do not put ice on your bare skin.  Put ice in a plastic bag.  Place a towel between your skin and the bag.  Leave the ice on for 20 minutes, 2-3 times a day.  Do this for as long as told by your doctor. Compression Compression means putting pressure on the injured area. This can be done with an elastic bandage. If an elastic bandage has been applied:  Remove and reapply the bandage every 3-4 hours or as told by your doctor.  Make sure the bandage is not wrapped too tight. Wrap the bandage more loosely if part of your body beyond the bandage is blue, swollen, cold, painful, or loses feeling (numb).  See your doctor if the bandage seems to make your problems worse.  Elevation Elevation means keeping the injured area raised. Raise the injured area above your heart or the center of your chest if you can. When should I get help? You should get help if:  You keep having pain and swelling.  Your symptoms get worse.  Get help right away if: You should get help right away if:  You have sudden bad pain at or below the area of your injury.  You have redness or more swelling around your injury.  You have tingling or numbness at or below the injury that does not go away when you take off the bandage.  This information is not intended to replace advice given to you by your health care provider. Make sure you discuss any questions you have with your health care provider. Document Released: 05/01/2008 Document Revised: 10/10/2016 Document Reviewed: 10/21/2014 Elsevier Interactive Patient Education  2017  [employer]  

## 2018-04-13 ENCOUNTER — Emergency Department (HOSPITAL_COMMUNITY): Payer: Worker's Compensation

## 2018-04-13 ENCOUNTER — Emergency Department (HOSPITAL_COMMUNITY)
Admission: EM | Admit: 2018-04-13 | Discharge: 2018-04-13 | Disposition: A | Discharge status: Home / Self Care | Payer: Worker's Compensation | Attending: Emergency Medicine | Admitting: Emergency Medicine

## 2018-04-13 ENCOUNTER — Encounter (HOSPITAL_COMMUNITY): Payer: Self-pay | Admitting: Emergency Medicine

## 2018-04-13 ENCOUNTER — Other Ambulatory Visit: Payer: Self-pay

## 2018-04-13 DIAGNOSIS — S93105A Unspecified dislocation of left toe(s), initial encounter: Secondary | ICD-10-CM

## 2018-04-13 DIAGNOSIS — Z95810 Presence of automatic (implantable) cardiac defibrillator: Secondary | ICD-10-CM | POA: Insufficient documentation

## 2018-04-13 DIAGNOSIS — Z951 Presence of aortocoronary bypass graft: Secondary | ICD-10-CM | POA: Diagnosis not present

## 2018-04-13 DIAGNOSIS — I5022 Chronic systolic (congestive) heart failure: Secondary | ICD-10-CM | POA: Insufficient documentation

## 2018-04-13 DIAGNOSIS — M24275 Disorder of ligament, left foot: Secondary | ICD-10-CM | POA: Diagnosis not present

## 2018-04-13 DIAGNOSIS — Z87891 Personal history of nicotine dependence: Secondary | ICD-10-CM | POA: Insufficient documentation

## 2018-04-13 DIAGNOSIS — W010XXA Fall on same level from slipping, tripping and stumbling without subsequent striking against object, initial encounter: Secondary | ICD-10-CM | POA: Diagnosis not present

## 2018-04-13 DIAGNOSIS — Y9389 Activity, other specified: Secondary | ICD-10-CM | POA: Insufficient documentation

## 2018-04-13 DIAGNOSIS — Y990 Civilian activity done for income or pay: Secondary | ICD-10-CM | POA: Insufficient documentation

## 2018-04-13 DIAGNOSIS — N183 Chronic kidney disease, stage 3 (moderate): Secondary | ICD-10-CM | POA: Diagnosis not present

## 2018-04-13 DIAGNOSIS — Y929 Unspecified place or not applicable: Secondary | ICD-10-CM | POA: Diagnosis not present

## 2018-04-13 DIAGNOSIS — S93122A Dislocation of metatarsophalangeal joint of left great toe, initial encounter: Secondary | ICD-10-CM | POA: Diagnosis not present

## 2018-04-13 DIAGNOSIS — S99922A Unspecified injury of left foot, initial encounter: Secondary | ICD-10-CM | POA: Diagnosis present

## 2018-04-13 DIAGNOSIS — I2510 Atherosclerotic heart disease of native coronary artery without angina pectoris: Secondary | ICD-10-CM | POA: Diagnosis not present

## 2018-04-13 DIAGNOSIS — I130 Hypertensive heart and chronic kidney disease with heart failure and stage 1 through stage 4 chronic kidney disease, or unspecified chronic kidney disease: Secondary | ICD-10-CM | POA: Diagnosis not present

## 2018-04-13 MED ORDER — LIDOCAINE HCL (PF) 2 % IJ SOLN
INTRAMUSCULAR | Status: AC
  Filled 2018-04-13: qty 10

## 2018-04-13 MED ORDER — HYDROCODONE-ACETAMINOPHEN 5-325 MG PO TABS
1.0000 | ORAL_TABLET | Freq: Once | ORAL | Status: AC
  Administered 2018-04-13: 1 via ORAL
  Filled 2018-04-13: qty 1

## 2018-04-13 MED ORDER — HYDROCODONE-ACETAMINOPHEN 5-325 MG PO TABS: 1.0000 | ORAL_TABLET | ORAL | 0 refills | Status: DC | PRN

## 2018-04-13 MED ORDER — LIDOCAINE HCL (PF) 2 % IJ SOLN
2.0000 mL | Freq: Once | INTRAMUSCULAR | Status: AC
  Administered 2018-04-13: 2 mL
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
## 2018-04-13 NOTE — ED Notes (Signed)
Asked by PA to wait for orders until portable Rad is completed

## 2018-04-13 NOTE — ED Provider Notes (Signed)
[facility] EMERGENCY DEPARTMENT Provider Note   CSN: [id number] Arrival date & time: 04/13/18  1456     History   Chief Complaint Chief Complaint  Patient presents with  . Foot Injury    HPI Joseph [name] is a 62 y.o. male presenting with persistent foot pain and swelling since getting tangled in cords lying on the ground yesterday.  He describes possibly twisting the foot and ankle, and may have hyperextended it during the injury, he denies falling completely.  He was seen by his PCP and had x-rays yesterday which were read as negative although he has persistent pain and swelling which is worsened today.  He is unable to bear weight on the foot without significant pain.  He has taken Tylenol for pain which has not improved his symptoms.  He denies numbness in his foot or toes.  The history is provided by the patient and the spouse.  Foot Injury   Pertinent negatives include no numbness.    Past Medical History:  Diagnosis Date  . CHF (congestive heart failure) (HCC)    EF 20%  . Coronary artery disease   . GI bleed    Mallory-Weiss tear EGD 02/2010  . Hyperlipidemia   . Ileus (HCC) 2006   required hospitalization  . Left ventricular dysfunction    apical thrombus  . MRSA bacteremia   . 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 Jude  . Small bowel obstruction (HCC) 2007   resolved without surgery    Patient Active Problem List   Diagnosis Date Noted  . Dyslipidemia 01/09/2018  . Heme + stool 09/10/2017  . Stage 3 chronic kidney disease (HCC) 08/15/2017  . Essential hypertension   . Acute systolic HF (heart failure) (HCC) 01/16/2015  . CHF (congestive heart failure) (HCC) 01/16/2015  . Arteriosclerosis of coronary artery 08/05/2014  . Essential (primary) hypertension 08/05/2014  . Acid reflux 08/05/2014  . Hx of CABG 09/06/2011  . Chronic systolic heart failure (HCC) 05/02/2011  . LAD stenosis   . Coronary artery  disease   . BACTEREMIA 03/30/2010  . Automatic implantable cardioverter-defibrillator in situ 01/12/2010  . Other specified forms of chronic ischemic heart disease 11/03/2009  . Cardiomyopathy, ischemic 11/03/2009  . HYPERLIPIDEMIA 02/02/2009    Past Surgical History:  Procedure Laterality Date  . basilic vein left arm resection    . CARDIAC DEFIBRILLATOR PLACEMENT    . CORONARY ARTERY BYPASS GRAFT     6 vessels, 2000  . INSERT / REPLACE / REMOVE PACEMAKER  01/05/10   ICD insertion/St. Jude single chamber        Home Medications    Prior to Admission medications   Medication Sig Start Date End Date Taking? Authorizing Provider  acetaminophen (TYLENOL) 500 MG tablet Take 500 mg by mouth every 6 (six) hours as needed for pain.    [provider]  albuterol (PROVENTIL HFA;VENTOLIN HFA) 108 (90 Base) MCG/ACT inhaler Inhale 2 puffs into the lungs every 4 (four) hours as needed. 03/27/16   Devoria Albe, MD  aspirin 81 MG tablet Take 81 mg by mouth at bedtime.     [provider]  atorvastatin (LIPITOR) 80 MG tablet TAKE 1 TABLET BY MOUTH EVERY DAY IN THE EVENING 02/13/18   Rollene Rotunda, MD  dextromethorphan-guaiFENesin [facility] DM) 30-600 MG per 12 hr tablet Take 1 tablet by mouth 2 (two) times daily as needed for cough.    [provider]  fish oil-omega-3 fatty  acids 1000 MG capsule Take 1 g by mouth 3 (three) times daily.     [provider]  HYDROcodone-acetaminophen (NORCO/VICODIN) 5-325 MG tablet Take 1 tablet by mouth every 4 (four) hours as needed. 04/13/18   Tequia Wolman, Raynelle Fanning, PA-C  KLOR-CON M20 20 MEQ tablet TAKE 1 TABLET (20 MEQ TOTAL) BY MOUTH DAILY. 05/21/17   Rollene Rotunda, MD  metoprolol tartrate (LOPRESSOR) 50 MG tablet TAKE 1 TABLET BY MOUTH THREE TIMES A DAY 01/29/18   Rollene Rotunda, MD  nitroGLYCERIN (NITROSTAT) 0.4 MG SL tablet Place 1 tablet (0.4 mg total) under the tongue every 5 (five) minutes as needed for chest pain. 05/24/16   Rollene Rotunda, MD  omeprazole (PRILOSEC) 20 MG capsule TAKE 1 CAPSULE BY MOUTH EVERY DAY 03/18/18   Rollene Rotunda, MD  sacubitril-valsartan (ENTRESTO) 97-103 MG Take 1 tablet by mouth 2 (two) times daily. 01/09/18   Rollene Rotunda, MD  torsemide (DEMADEX) 20 MG tablet Take 2 tablets (40 mg total) by mouth daily. 11/01/17 04/12/18  Jodelle Gross, NP    Family History Family History  Problem Relation Age of Onset  . Heart attack Mother   . Emphysema Father   . Coronary artery disease Father        s/p cabg  . Colon cancer Neg Hx     Social History Social History   Tobacco Use  . Smoking status: Former Smoker    Types: Cigarettes    Start date: 11/28/1967    Last attempt to quit: 02/29/1988    Years since quitting: 30.1  . Smokeless tobacco: Never Used  Substance Use Topics  . Alcohol use: No    Alcohol/week: 0.0 oz  . Drug use: No     Allergies   Patient has no known allergies.   Review of Systems Review of Systems  Constitutional: Negative for fever.  Musculoskeletal: Positive for arthralgias and joint swelling. Negative for myalgias.  Neurological: Negative for weakness and numbness.     Physical Exam Updated Vital Signs BP 133/64 (BP Location: Right Arm)   Pulse 73   Temp 98.6 F (37 C) (Oral)   Resp 18   SpO2 98%   Physical Exam  Constitutional: He appears well-developed and well-nourished.  HENT:  Head: Atraumatic.  Neck: Normal range of motion.  Cardiovascular:  Pulses equal bilaterally  Musculoskeletal: He exhibits edema and tenderness.  Moderate edema and mild erythema of left dorsal foot.  Tender to palpation.  His second toe on this foot appears dislocated/deformed.  Distal sensation in his toes are intact.  He has good cap refill in his toes.  Dorsalis pedis pulses are intact.  Neurological: He is alert. He has normal strength. He displays normal reflexes. No sensory deficit.  Skin: Skin is warm and dry.  Psychiatric: He has a normal mood and affect.      ED Treatments / Results  Labs (all labs ordered are listed, but only abnormal results are displayed) Labs Reviewed - No data to display  EKG None  Radiology Dg Ankle Complete Left  Result Date: 04/13/2018 CLINICAL DATA:  Left ankle and foot pain, injury. EXAM: LEFT ANKLE COMPLETE - 3+ VIEW COMPARISON:  Foot series performed today.  Ankle series 04/12/2018 FINDINGS: No acute bony abnormality. Specifically, no fracture, subluxation, or dislocation. Plantar calcaneal spur. Vascular calcifications. IMPRESSION: No acute bony abnormality. Electronically Signed   By: Charlett Nose M.D.   On: 04/13/2018 16:07   Dg Ankle Complete Left  Result Date: 04/12/2018 CLINICAL DATA:  Left ankle injury today. EXAM: LEFT ANKLE COMPLETE - 3+ VIEW COMPARISON:  None. FINDINGS: There is no evidence of fracture, dislocation, or joint effusion. There is no evidence of arthropathy or other focal bone abnormality. Vascular calcifications are noted. IMPRESSION: No significant abnormality seen in the left ankle. Electronically Signed   By: Lupita Raider, M.D.   On: 04/12/2018 16:57   Dg Foot Complete Left  Result Date: 04/13/2018 CLINICAL DATA:  Injury, left foot and ankle pain EXAM: LEFT FOOT - COMPLETE 3+ VIEW COMPARISON:  Ankle series performed today FINDINGS: Question widening between the 1st and 2nd proximal metatarsals and medial and middle cuneiforms. No visible fracture. Diffuse soft tissue swelling in the midfoot region. There is dislocation noted at the 2nd MTP joint. IMPRESSION: Left 2nd MTP dislocation. Apparent widening between the medial and middle cuneiforms and proximal 1st and 2nd metatarsals. Cannot exclude ligamentous injury. No visible fracture. Consider further evaluation with MRI if clinical concern persists. Electronically Signed   By: Charlett Nose M.D.   On: 04/13/2018 16:10   Dg Toe 2nd Left  Result Date: 04/13/2018 CLINICAL DATA:  Postreduction EXAM: LEFT SECOND TOE COMPARISON:   04/13/2018 FINDINGS: Interval reduction of the previously seen dislocated left 2nd MTP joint. No fracture, subluxation or dislocation currently. IMPRESSION: Interval reduction of the left 2nd MTP joint.  No fracture. Electronically Signed   By: Charlett Nose M.D.   On: 04/13/2018 18:16    Procedures Reduction of dislocation Date/Time: 04/13/2018 6:04 PM Performed by: Burgess Amor, PA-C Authorized by: Burgess Amor, PA-C  Consent: Verbal consent obtained. Risks and benefits: risks, benefits and alternatives were discussed Consent given by: patient Patient understanding: patient states understanding of the procedure being performed Time out: Immediately prior to procedure a "time out" was called to verify the correct patient, procedure, equipment, support staff and site/side marked as required. Preparation: Patient was prepped and draped in the usual sterile fashion. Local anesthesia used: yes Anesthesia: digital block  Anesthesia: Local anesthesia used: yes Local Anesthetic: lidocaine 2% without epinephrine Anesthetic total: 2 mL  Sedation: Patient sedated: no  Patient tolerance: Patient tolerated the procedure well with no immediate complications Comments: Traction applied to toe with adequate reduction. Post reduction film with reduced toe.    (including critical care time)  Medications Ordered in ED Medications  lidocaine (XYLOCAINE) 2 % injection 2 mL (2 mLs Other Given 04/13/18 1720)  HYDROcodone-acetaminophen (NORCO/VICODIN) 5-325 MG per tablet 1 tablet (1 tablet Oral Given 04/13/18 1719)     Initial Impression / Assessment and Plan / ED Course  I have reviewed the triage vital signs and the nursing notes.  Pertinent labs & imaging results that were available during my care of the patient were reviewed by me and considered in my medical decision making (see chart for details).      Patient with second left toe dislocation, reduced today.  Foot film is also suggestive of a  ligament injury of his tarsals.  He was placed in a cam walker, buddy taping of the dislocated toe also performed.  Crutches.  Discussed rest, ice, elevation.  He was prescribed hydrocodone planned office follow-up with Dr. Romeo Apple, patient to call in the morning for an appointment time.  Discussed with him that he may need further imaging such as an MRI to get more detail regarding suspected ligament injury.  Final Clinical Impressions(s) / ED Diagnoses   Final diagnoses:  Disorder of ligament, left foot  Toe dislocation, left, initial encounter  ED Discharge Orders        Ordered    HYDROcodone-acetaminophen (NORCO/VICODIN) 5-325 MG tablet  Every 4 hours PRN     04/13/18 1808       Burgess Amor, PA-C 04/13/18 1826    Doug Sou, MD 04/13/18 [phone]

## 2018-04-13 NOTE — ED Notes (Signed)
Awaiting DC instructions 

## 2018-04-13 NOTE — Discharge Instructions (Signed)
Wear the Cam walker to protect your foot and toe as discussed, use the crutches to avoid weightbearing.  Take the hydrocodone if needed for pain.  Ice and elevation will also help with pain and swelling.  As discussed you will need further evaluation and imaging, possibly an MRI to get more detailed information about the suspected ligament injury in your foot.  Call Dr. Romeo Apple on Monday for an office visit for further management of your injury.  Do not drive within 4 hours of taking the hydrocodone as this medication will make you drowsy.

## 2018-04-15 ENCOUNTER — Telehealth: Payer: Self-pay | Admitting: Orthopedic Surgery

## 2018-04-15 LAB — CUP PACEART REMOTE DEVICE CHECK
HIGH POWER IMPEDANCE MEASURED VALUE: 53 Ohm
Implantable Lead Implant Date: 20110209
Implantable Lead Location: 753860
Implantable Pulse Generator Implant Date: 20110209
Lead Channel Pacing Threshold Amplitude: 0.75 V
Lead Channel Pacing Threshold Pulse Width: 0.4 ms
Lead Channel Sensing Intrinsic Amplitude: 12 mV
Lead Channel Setting Sensing Sensitivity: 0.5 mV
MDC IDC MSMT BATTERY REMAINING LONGEVITY: 43 mo
MDC IDC MSMT BATTERY REMAINING PERCENTAGE: 36 %
MDC IDC MSMT BATTERY VOLTAGE: 2.89 V
MDC IDC MSMT LEADCHNL RV IMPEDANCE VALUE: 450 Ohm
MDC IDC SESS DTM: 20190506084235
MDC IDC SET LEADCHNL RV PACING AMPLITUDE: 2.5 V
MDC IDC SET LEADCHNL RV PACING PULSEWIDTH: 0.4 ms
MDC IDC STAT BRADY RV PERCENT PACED: 1 %
Pulse Gen Serial Number: 747960

## 2018-04-15 NOTE — Telephone Encounter (Signed)
Received Worker's comp claim# [id number] per claims manager [employer], direct [phone], Ext [phone], verbal approval received. Adjuster has been assigned: Lissa Morales, direct ph# [phone];  fax# [phone].  Our form faxed. Called patient; notified; confirmed appointment for 04/16/18 with Dr Romeo Apple.

## 2018-04-15 NOTE — Telephone Encounter (Signed)
Called patient upon coming to attention that patient's emergency room visit for foot/toe problem is due to a work-related injury. This was not clear at time staff scheduled appointment for tomorrow, 04/16/18. States saw primary care at [facility] 04/12/17; Emergency room visit 04/13/17.  States he has reported it to his chief at [facility] [place], ph# [phone]. Patient provided chief's cell number also:  [phone].* *Per Mr. Wallace Cullens, claim has been filed; awaiting claim number and adjuster to be designated. I will follow up before end of day so that we may work through process, as approval must be received prior to appointment. Patient aware of status.

## 2018-04-16 ENCOUNTER — Ambulatory Visit (INDEPENDENT_AMBULATORY_CARE_PROVIDER_SITE_OTHER): Payer: Worker's Compensation | Admitting: Orthopedic Surgery

## 2018-04-16 ENCOUNTER — Encounter: Payer: Self-pay | Admitting: Orthopedic Surgery

## 2018-04-16 VITALS — BP 104/67 | HR 77 | Ht 71.0 in | Wt 192.0 lb

## 2018-04-16 DIAGNOSIS — M79672 Pain in left foot: Secondary | ICD-10-CM | POA: Diagnosis not present

## 2018-04-16 NOTE — Progress Notes (Signed)
NEW PATIENT OFFICE VISIT   Chief Complaint  Patient presents with  . Foot Injury    ER follow up on left foot, DOI 04-12-18.   MEDICAL DECISION SECTION  xrays ordered? no  My independent reading of xrays: X-rays of the left foot show a dislocated metatarsal phalangeal joint second digit and widening of the first and second metatarsals at Lisfranc joint no avulsion fracture seen   Encounter Diagnosis  Name Primary?  . Acute foot pain, left Yes     PLAN: Continue first second toe buddy taping The patient has a pacemaker and defibrillator but needs further imaging of the foot with CT scan to further evaluate Lisfranc joint  No weightbearing 6 weeks  Out of work 6 weeks  Return after CT scan  No orders of the defined types were placed in this encounter.  Injection? no MRI/CT/? CT      Chief Complaint  Patient presents with  . Foot Injury    ER follow up on left foot, DOI 04-12-18.    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   Cardiovascular:       History of arrhythmia and history of heart disease no current symptoms of chest pain  Skin: Negative.   Neurological: Negative for tingling.     Past Medical History:  Diagnosis Date  . CHF (congestive heart failure) (HCC)    EF 20%  . Coronary artery disease   . GI bleed    Mallory-Weiss tear EGD 02/2010  . Hyperlipidemia   . Ileus (HCC) 2006   required hospitalization  .  Left ventricular dysfunction    apical thrombus  . MRSA bacteremia   . 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 Jude  . Small bowel obstruction (HCC) 2007   resolved without surgery    Past Surgical History:  Procedure Laterality Date  . basilic vein left arm resection    . CARDIAC DEFIBRILLATOR PLACEMENT    . CORONARY ARTERY BYPASS GRAFT     6 vessels, 2000  . INSERT / REPLACE / REMOVE PACEMAKER  01/05/10   ICD insertion/St. Jude single chamber    Family History  Problem Relation Age of Onset  . Heart attack Mother   . Emphysema Father   . Coronary artery disease Father        s/p cabg  . Colon cancer Neg Hx    Social History   Tobacco Use  . Smoking status: Former Smoker    Types: Cigarettes    Start date: 11/28/1967    Last attempt to quit: 02/29/1988    Years since quitting: 30.1  . Smokeless tobacco: Never Used  Substance Use Topics  . Alcohol use: No    Alcohol/week: 0.0 oz  . Drug use: No    @ALL @  Current Meds  Medication Sig  . acetaminophen (TYLENOL) 500 MG tablet Take 500 mg by mouth every 6 (six) hours as  needed for pain.  Marland Kitchen albuterol (PROVENTIL HFA;VENTOLIN HFA) 108 (90 Base) MCG/ACT inhaler Inhale 2 puffs into the lungs every 4 (four) hours as needed.  Marland Kitchen aspirin 81 MG tablet Take 81 mg by mouth at bedtime.   Marland Kitchen atorvastatin (LIPITOR) 80 MG tablet TAKE 1 TABLET BY MOUTH EVERY DAY IN THE EVENING  . dextromethorphan-guaiFENesin (MUCINEX DM) 30-600 MG per 12 hr tablet Take 1 tablet by mouth 2 (two) times daily as needed for cough.  . fish oil-omega-3 fatty acids 1000 MG capsule Take 1 g by mouth 3 (three) times daily.   Marland Kitchen HYDROcodone-acetaminophen (NORCO/VICODIN) 5-325 MG tablet Take 1 tablet by mouth every 4 (four) hours as needed.  Marland Kitchen KLOR-CON M20 20 MEQ tablet TAKE 1 TABLET (20 MEQ TOTAL) BY MOUTH DAILY.  . metoprolol tartrate (LOPRESSOR) 50 MG tablet TAKE 1 TABLET BY MOUTH THREE TIMES A DAY  . nitroGLYCERIN  (NITROSTAT) 0.4 MG SL tablet Place 1 tablet (0.4 mg total) under the tongue every 5 (five) minutes as needed for chest pain.  Marland Kitchen omeprazole (PRILOSEC) 20 MG capsule TAKE 1 CAPSULE BY MOUTH EVERY DAY    BP 104/67   Pulse 77   Ht 5\' 11"  (1.803 m)   Wt 192 lb (87.1 kg)   BMI 26.78 kg/m   Physical Exam  Constitutional: He is oriented to person, place, and time. He appears well-developed and well-nourished.  Vital signs have been reviewed and are stable. Gen. appearance the patient is well-developed and well-nourished with normal grooming and hygiene.   Neurological: He is alert and oriented to person, place, and time.  Skin: Skin is warm and dry. No erythema.  Psychiatric: He has a normal mood and affect.  Vitals reviewed.   Ortho Exam   Left foot tenderness at Lisfranc joint with dorsal swelling tenderness at second metatarsophalangeal joint with normal alignment of the toe no obvious motion at Lisfranc joint no crepitance ankle joint is stable toe second metatarsal phalangeal joint stable muscle tone of the foot normal skin is intact no erythema no blistering sensation is normal to the foot  Right foot inspection revealed no malalignment assessment of motion was normal no instability was detected normal muscle tone skin pulse and perfusion sensation was normal   Fuller Canada, MD  04/16/2018 12:22 PM

## 2018-04-16 NOTE — Patient Instructions (Signed)
Bearing no weightbearing for 6 weeks  Work note no work for 6 weeks but okay to go to class for work

## 2018-04-23 ENCOUNTER — Telehealth: Payer: Self-pay | Admitting: Orthopedic Surgery

## 2018-04-23 NOTE — Telephone Encounter (Signed)
We are trying to get CT scan of foot, need it soon, last I had was WC had approved, and they were going to schedule for him. Do you know who he needs to contact to get this done?

## 2018-04-23 NOTE — Telephone Encounter (Signed)
Notes of all recent communication have been entered into referral workqueue. The appointment has been scheduled at [facility] 04/25/18 at 9:15a.m.  As noted, approval was received 04/17/18 however patient initially did not agree to go to [facility] in [place] - he had asked if he could have the CT done at [facility], which delayed the process. He is aware of appointment for CT and of follow up in our office.

## 2018-04-23 NOTE — Telephone Encounter (Signed)
Patient called and was asking whether or not MRI has been scheduled.  He said he spoke to someone from [facility] and that it has been approved.  I asked him if he knew whether or not WC  had spoken with our office and given approval and he said he didn't know.  I told him that I would speak with you and see where this stands.  Please call him when you have a moment.  Thanks

## 2018-04-26 ENCOUNTER — Telehealth: Payer: Self-pay | Admitting: Radiology

## 2018-04-26 NOTE — Telephone Encounter (Signed)
I put his CT results on your desk by your mouse, he has a lisfranc injury  Please advise

## 2018-04-26 NOTE — Telephone Encounter (Signed)
SEEING HIM June 6TH

## 2018-05-02 ENCOUNTER — Telehealth: Payer: Self-pay | Admitting: Radiology

## 2018-05-02 ENCOUNTER — Ambulatory Visit (INDEPENDENT_AMBULATORY_CARE_PROVIDER_SITE_OTHER): Payer: Worker's Compensation | Admitting: Orthopedic Surgery

## 2018-05-02 ENCOUNTER — Encounter: Payer: Self-pay | Admitting: Orthopedic Surgery

## 2018-05-02 VITALS — BP 127/75 | HR 68 | Ht 71.0 in | Wt 192.0 lb

## 2018-05-02 DIAGNOSIS — S93325D Dislocation of tarsometatarsal joint of left foot, subsequent encounter: Secondary | ICD-10-CM | POA: Diagnosis not present

## 2018-05-02 DIAGNOSIS — S93129D Dislocation of metatarsophalangeal joint of unspecified toe(s), subsequent encounter: Secondary | ICD-10-CM

## 2018-05-02 NOTE — Telephone Encounter (Signed)
Notes, reports have been faxed to patient's workers comp Therapist, nutritional at [employer] ph [phone] to request authorization, to fax#[phone]. Noted that Dr Audrie Lia staff at [facility] have also called patient. Notes have been updated in referral workqueue.

## 2018-05-02 NOTE — Telephone Encounter (Signed)
Dr Romeo Apple would like patient to see Dr Lajoyce Corners for his foot, I put in the referral and told his office we will get [facility] approval for the visit

## 2018-05-02 NOTE — Patient Instructions (Addendum)
OOW 6 MORE WEEKS  Lisfranc Midfoot Injury A Lisfranc midfoot injury is a break (fracture), separation (dislocation), or sprain involving the bones and joints in the middle of your foot. Your midfoot is made up of a cluster of small bones (tarsals) that attach to the long bones going to your toes (metatarsals). Strong bands of tissue (ligaments) attach the bones of your midfoot to each other. A Lisfranc midfoot injury can include:  Ligament damage or tears.  Bone fractures.  Dislocations.  A combination of these injuries.  This type of injury can cause foot pain and instability. The condition can range from mild to severe. What are the causes? This condition may be caused by:  An injury that twists your foot forcefully.  Tripping or falling over your foot.  Falling from a height.  A hard, direct hit or a crushing injury to your foot.  What increases the risk? This condition is more likely to develop in athletes who participate in:  Contact sports, especially while wearing cleats, like in football.  Activities in which your foot is loaded in a pointed position like dance and gymnastics.  What are the signs or symptoms? Symptoms of this condition include:  Foot pain that gets worse with standing or walking.  Foot swelling.  Bruising on the top or bottom of the foot.  Pain when pressing on the top or bottom of the foot (tenderness).  Seeing or feeling a new bump on the top of your foot.  How is this diagnosed? This condition is diagnosed based on your symptoms and medical history, especially if you recently had an injury. Your health care provider will also do a physical exam to check for bruising under your foot andmove your toes to test for pain. You may be asked if you can stand up on tiptoe. You may also have imaging studies done, including:  X-rays.  CT scans.  MRI.  How is this treated? The first treatments for ligament injuries that do not cause instability  or dislocation of the midfoot are usually nonsurgical. These may include:  Using a boot or cast to keep your foot still and protect it while it heals (immobilization). You may need to wear this for at least 6 weeks or as told by your health care provider.  Using crutches to keep weight off your foot.  Physical therapy to improve motion and strength.  Medicine for pain.  Surgery is the treatment for fractures, dislocations, and unstable ligament injuries. This may include ligament repair, joint fusion, or a procedure to stabilize the fracture using screws or plates. After surgery, you will have to wear a cast and eventually have physical therapy. Follow these instructions at home: If you have a boot:  Wear the boot as told by your health care provider. Remove it only as told by your health care provider.  Loosen the boot if your toes tingle, become numb, or turn cold and blue.  Do not let your boot get wet if it is not waterproof.  Keep the boot clean. If you have a cast:  Do not stick anything inside the cast to scratch your skin. Doing that increases your risk of infection.  Check the skin around the cast every day. Tell your health care provider about any concerns.  You may put lotion on dry skin around the edges of the cast. Do not put lotion on the skin underneath the cast.  Do not let your cast get wet if it is not waterproof.  Keep the cast clean. Bathing  Do not take baths, swim, or use a hot tub until your health care provider approves. Ask your health care provider if you can take showers. You may only be allowed to take sponge baths for bathing.  If your boot or cast is not waterproof, protect it with a watertight covering when you take a bath or a shower. Managing pain, stiffness, and swelling  If directed, apply ice to the injured area. ? If you have a removable boot, remove it as told by your health care provider. ? Put ice in a plastic bag. ? Place a towel  between the bag and your skin or between the bag and your cast. ? Leave the ice on for 20 minutes, 2-3 times a day.  Move your toes often to avoid stiffness and to lessen swelling.  Raise (elevate) the injured area above the level of your heart while you are sitting or lying down. Driving  Do not drive or operate heavy machinery while taking prescription pain medicine.  Ask your health care provider when it is safe to drive if you have a boot or cast on your foot. Activity  Return to your normal activities as told by your health care provider. Ask your health care provider what activities are safe for you.  Do exercises as told by your health care provider. Safety  Do not use the injured limb to support your body weight until your health care provider says that you can. Use your crutches as told by your health care provider. General instructions  Do not put pressure on any part of the cast until it is fully hardened. This may take several hours.  Do not use any tobacco products, such as cigarettes, chewing tobacco, and e-cigarettes. Tobacco can delay healing. If you need help quitting, ask your health care provider.  Take over-the-counter and prescription medicines only as told by your health care provider.  Keep all follow-up visits as told by your health care provider. This is important. How is this prevented?  Make sure to use equipment that fits you.  Use footwear that is appropriate for your athletic activity and the playing surface.  Be safe and responsible while being active to avoid falls. Contact a health care provider if:  Your pain medicine and rest are not helping. Get help right away if:  Your foot becomes very painful or numb.  Your toes become very pale or turn blue. This information is not intended to replace advice given to you by your health care provider. Make sure you discuss any questions you have with your health care provider. Document Released:  11/13/2005 Document Revised: 07/18/2016 Document Reviewed: 07/28/2015 Elsevier Interactive Patient Education  [employer].

## 2018-05-02 NOTE — Progress Notes (Signed)
FOLLOW UP VISIT : CT  RESULTS   Chief Complaint  Patient presents with  . Follow-up    recheck on left foot, CT results. DOI 04-12-18.     HPI: The patient is here TO DISCUSS THE RESULTS OF MRI  Follow-up visit 62 year old male who has a possible Lisfranc joint injury as well as a second metatarsophalangeal joint dislocation.  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   Cardiovascular: Negative for chest pain.     BP 127/75   Pulse 68   Ht 5\' 11"  (1.803 m)   Wt 192 lb (87.1 kg)   BMI 26.78 kg/m     Medical decision-making section   DATA  CT scan shows Lisfranc injury with small avulsion fracture medial cuneiform at the attachment of the plantar band of Lisfranc ligament minimal widening of interspace first and second metatarsals high-grade subluxation is not seen.  Additional tiny avulsion fractures of medial middle and lateral cuneiform and extensive soft tissue swelling.  CT scan was done through [facility] on Apr 25, 2018  MY READING: MRI OF THE Lisfranc ligament injury with multiple fractures as noted   Encounter Diagnoses  Name Primary?  . Dislocation of tarsometatarsal joint of left foot, subsequent encounter Yes  . Closed dislocation  of metatarsophalangeal joint, subsequent encounter, second left foot    The Lisfranc injury will be referred to foot and ankle surgeon for definitive management and if no surgery is needed we are happy to see him back here for the remainder of his care  His second metatarsal phalangeal joint toe injury is doing well   PLAN:   No weightbearing  Refer to foot and ankle surgeon for definitive management decision.  We do understand that he has some heart disease and may not be a great surgical candidate.   if he does not need surgery recommend referral back to me for casting and further treatment

## 2018-05-03 ENCOUNTER — Telehealth: Payer: Self-pay | Admitting: Family Medicine

## 2018-05-03 ENCOUNTER — Telehealth: Payer: Self-pay | Admitting: Radiology

## 2018-05-03 NOTE — Telephone Encounter (Signed)
I spoke to Joseph [name] at [facility] and have approval for patient to see Dr Derry Skill, will you let me know when appt is scheduled, so I can let Joseph [name] know.

## 2018-05-03 NOTE — Telephone Encounter (Signed)
Pt informed CD is up front for him to pick up

## 2018-05-06 NOTE — Telephone Encounter (Signed)
Joseph [name] , I spoke to Arlana Hove at [facility], he approved the visit with Dr Tressia [facility] ph [phone] fax#[phone].

## 2018-05-13 ENCOUNTER — Ambulatory Visit (INDEPENDENT_AMBULATORY_CARE_PROVIDER_SITE_OTHER): Payer: Worker's Compensation | Admitting: Orthopedic Surgery

## 2018-05-13 ENCOUNTER — Encounter (INDEPENDENT_AMBULATORY_CARE_PROVIDER_SITE_OTHER): Payer: Self-pay | Admitting: Orthopedic Surgery

## 2018-05-13 VITALS — Ht 71.0 in | Wt 194.0 lb

## 2018-05-13 DIAGNOSIS — S92902A Unspecified fracture of left foot, initial encounter for closed fracture: Secondary | ICD-10-CM

## 2018-05-13 NOTE — Progress Notes (Signed)
Office Visit Note   Patient: Joseph [name]           Date of Birth: 05/13/1956           MRN: [id number] Visit Date: 05/13/2018              Requested by: Vickki Hearing, MD [address] [place], [place] [address] PCP: Joette Catching, MD  Chief Complaint  Patient presents with  . Left Foot - Fracture      HPI: Patient is a 62 year old gentleman who was seen in referral from Dr. Romeo Apple.  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  Patient has undergone good conservative treatment he is on crutches and a fracture boot and states he is asymptomatic.  Assessment & Plan: Visit Diagnoses:  1. Fracture of foot, left, closed, initial encounter     Plan: We will allow patient to start weightbearing as tolerated in the fracture boot.  Discussed that most of the time these fractures become symptomatic with weightbearing and will not resolve unless there is surgical intervention.  With patient's peripheral vascular disease I am concerned that he would have  difficulty with wound healing.  Patient will start wearing a knee-high pair of compression stockings to help decrease the swelling and improve the microcirculation and obtain 3 view radiographs weightbearing of the left foot at follow-up.  Patient is out of work until seen in follow-up.  Discussed with the patient that most of the time open reduction internal fixation is necessary for resolution of the Lisfranc injury.  Follow-Up Instructions: Return in about 1 month (around 06/10/2018).   Ortho Exam  Patient is alert, oriented, no adenopathy, well-dressed, normal affect, normal respiratory effort. Examination patient does not have a palpable dorsalis pedis pulse he does not have a palpable posterior tibial pulse.  He has a Doppler study which shows a dampened biphasic posterior tibial pulse and a  dampened monophasic dorsalis pedis pulse.  He has venous stasis swelling with pitting edema there is no ecchymosis no bruising there is no pain with attempted distraction across the Lisfranc joint.  Patient's CT scan was reviewed which shows avulsion fractures across the Lisfranc complex with widening of the Lisfranc joint.  Imaging: No results found. No images are attached to the encounter.  Labs: Lab Results  Component Value Date   HGBA1C (H) 03/17/2010    6.1 (NOTE)                                                                       According to the ADA Clinical Practice Recommendations for 2011, when HbA1c is used as a screening test:   >=6.5%   Diagnostic of Diabetes Mellitus           (if abnormal result  is confirmed)  5.7-6.4%   Increased risk of developing Diabetes Mellitus  References:Diagnosis and Classification of Diabetes Mellitus,Diabetes Care,2011,34(Suppl 1):S62-S69 and Standards of Medical Care in         Diabetes - 2011,Diabetes Care,2011,34  (Suppl 1):S11-S61.   REPTSTATUS 03/19/2010 FINAL 03/16/2010   GRAMSTAIN  03/16/2010  ABUNDANT WBC PRESENT,BOTH PMN AND MONONUCLEAR NO SQUAMOUS EPITHELIAL CELLS SEEN RARE GRAM POSITIVE COCCI IN PAIRS IN CLUSTERS   CULT  03/16/2010    FEW METHICILLIN RESISTANT STAPHYLOCOCCUS AUREUS Note: SUSCEPTIBILITIES PERFORMED ON PREVIOUS CULTURE WITHIN THE LAST 5 DAYS. CRITICAL RESULT CALLED TO, READ BACK BY AND VERIFIED WITH: LOLLY HATTLE @1109AM  03/19/10 MURPE   LABORGA METHICILLIN RESISTANT STAPHYLOCOCCUS AUREUS 03/12/2010     Lab Results  Component Value Date   ALBUMIN 3.4 (L) 10/28/2017   ALBUMIN 3.5 03/27/2016   ALBUMIN 4.0 12/31/2015    Body mass index is 27.06 kg/m.  Orders:  No orders of the defined types were placed in this encounter.  No orders of the defined types were placed in this encounter.    Procedures: No procedures performed  Clinical Data: No additional findings.  ROS:  All other systems negative,  except as noted in the HPI. Review of Systems  Objective: Vital Signs: Ht 5\' 11"  (1.803 m)   Wt 194 lb (88 kg)   BMI 27.06 kg/m   Specialty Comments:  No specialty comments available.  PMFS History: Patient Active Problem List   Diagnosis Date Noted  . Dyslipidemia 01/09/2018  . Heme + stool 09/10/2017  . Stage 3 chronic kidney disease (HCC) 08/15/2017  . Essential hypertension   . Acute systolic HF (heart failure) (HCC) 01/16/2015  . CHF (congestive heart failure) (HCC) 01/16/2015  . Arteriosclerosis of coronary artery 08/05/2014  . Essential (primary) hypertension 08/05/2014  . Acid reflux 08/05/2014  . Hx of CABG 09/06/2011  . Chronic systolic heart failure (HCC) 05/02/2011  . LAD stenosis   . Coronary artery disease   . BACTEREMIA 03/30/2010  . Automatic implantable cardioverter-defibrillator in situ 01/12/2010  . Other specified forms of chronic ischemic heart disease 11/03/2009  . Cardiomyopathy, ischemic 11/03/2009  . HYPERLIPIDEMIA 02/02/2009   Past Medical History:  Diagnosis Date  . CHF (congestive heart failure) (HCC)    EF 20%  . Coronary artery disease   . GI bleed    Mallory-Weiss tear EGD 02/2010  . Hyperlipidemia   . Ileus (HCC) 2006   required hospitalization  . Left ventricular dysfunction    apical thrombus  . MRSA bacteremia   . 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 Jude  . Small bowel obstruction (HCC) 2007   resolved without surgery    Family History  Problem Relation Age of Onset  . Heart attack Mother   . Emphysema Father   . Coronary artery disease Father        s/p cabg  . Colon cancer Neg Hx     Past Surgical History:  Procedure Laterality Date  . basilic vein left arm resection    . CARDIAC DEFIBRILLATOR PLACEMENT    . CORONARY ARTERY BYPASS GRAFT     6 vessels, 2000  . INSERT / REPLACE / REMOVE PACEMAKER  01/05/10   ICD insertion/St. Jude single chamber   Social History    Occupational History  . 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 Smoking status: Former Smoker    Types: Cigarettes    Start date: 11/28/1967    Last attempt to quit: 02/29/1988    Years since quitting: 30.2  . Smokeless tobacco: Never Used  Substance and Sexual Activity  . Alcohol use: No    Alcohol/week: 0.0 oz  . Drug use: No  . Sexual activity: Never

## 2018-05-27 ENCOUNTER — Telehealth: Payer: Self-pay | Admitting: Cardiology

## 2018-05-27 MED ORDER — POTASSIUM CHLORIDE CRYS ER 20 MEQ PO TBCR: 20.0000 meq | EXTENDED_RELEASE_TABLET | Freq: Every day | ORAL | 3 refills | Status: DC

## 2018-05-27 NOTE — Telephone Encounter (Signed)
New Message:        *STAT* If patient is at the pharmacy, call can be transferred to refill team.   1. Which medications need to be refilled? (please list name of each medication and dose if known) KLOR-CON M20 20 MEQ tablet  2. Which pharmacy/location (including street and city if local pharmacy) is medication to be sent to?[id number] - [place], [place] - [address]  3. Do they need a 30 day or 90 day supply? 90

## 2018-06-06 ENCOUNTER — Encounter: Payer: Self-pay | Admitting: Internal Medicine

## 2018-06-06 DIAGNOSIS — I10 Essential (primary) hypertension: Secondary | ICD-10-CM | POA: Diagnosis not present

## 2018-06-06 DIAGNOSIS — E1159 Type 2 diabetes mellitus with other circulatory complications: Secondary | ICD-10-CM | POA: Diagnosis not present

## 2018-06-06 DIAGNOSIS — I509 Heart failure, unspecified: Secondary | ICD-10-CM | POA: Diagnosis not present

## 2018-06-06 DIAGNOSIS — E1169 Type 2 diabetes mellitus with other specified complication: Secondary | ICD-10-CM | POA: Diagnosis not present

## 2018-06-06 DIAGNOSIS — I429 Cardiomyopathy, unspecified: Secondary | ICD-10-CM | POA: Diagnosis not present

## 2018-06-06 DIAGNOSIS — E785 Hyperlipidemia, unspecified: Secondary | ICD-10-CM | POA: Diagnosis not present

## 2018-06-06 DIAGNOSIS — D649 Anemia, unspecified: Secondary | ICD-10-CM | POA: Diagnosis not present

## 2018-06-06 DIAGNOSIS — K219 Gastro-esophageal reflux disease without esophagitis: Secondary | ICD-10-CM | POA: Diagnosis not present

## 2018-06-10 NOTE — Progress Notes (Signed)
HPI The patient returns for followup of coronary disease and cardiomyopathy.  Since I last saw him he fractured his ankle.  He has had at least a 14 pound weight gain according to our scales.  He is insistent that this is the [facility] and is adamant about coming off of this.  He said nothing else is changed.  Says he is not eating more or does not have any increased salt.  He watches his fluid.  He is taking occasional increased torsemide 3 times recently.  He is not describing new PND or orthopnea.  Is not having any cough fevers or chills.  He has had no new chest pressure, neck or arm discomfort.  He does have increased lower extremity edema.   No Known Allergies  Current Outpatient Medications  Medication Sig Dispense Refill  . acetaminophen (TYLENOL) 500 MG tablet Take 500 mg by mouth every 6 (six) hours as needed for pain.    Marland Kitchen albuterol (PROVENTIL HFA;VENTOLIN HFA) 108 (90 Base) MCG/ACT inhaler Inhale 2 puffs into the lungs every 4 (four) hours as needed. 6.7 g 0  . aspirin 81 MG tablet Take 81 mg by mouth at bedtime.     Marland Kitchen atorvastatin (LIPITOR) 80 MG tablet TAKE 1 TABLET BY MOUTH EVERY DAY IN THE EVENING 90 tablet 1  . dextromethorphan-guaiFENesin (MUCINEX DM) 30-600 MG per 12 hr tablet Take 1 tablet by mouth 2 (two) times daily as needed for cough.    . fish oil-omega-3 fatty acids 1000 MG capsule Take 1 g by mouth 3 (three) times daily.     Marland Kitchen HYDROcodone-acetaminophen (NORCO/VICODIN) 5-325 MG tablet Take 1 tablet by mouth every 4 (four) hours as needed. 20 tablet 0  . metoprolol tartrate (LOPRESSOR) 50 MG tablet TAKE 1 TABLET BY MOUTH THREE TIMES A DAY 270 tablet 3  . nitroGLYCERIN (NITROSTAT) 0.4 MG SL tablet Place 1 tablet (0.4 mg total) under the tongue every 5 (five) minutes as needed for chest pain. 25 tablet 3  . omeprazole (PRILOSEC) 20 MG capsule TAKE 1 CAPSULE BY MOUTH EVERY DAY 90 capsule 2  . potassium chloride SA (KLOR-CON M20) 20 MEQ tablet Take 1 tablet (20 mEq  total) by mouth daily. 90 tablet 3  . torsemide (DEMADEX) 20 MG tablet Take 2 tablets (40 mg total) by mouth daily. 180 tablet 3  . enalapril (VASOTEC) 20 MG tablet Take 1 tablet (20 mg total) by mouth 2 (two) times daily. 180 tablet 3   No current facility-administered medications for this visit.   Is  Past Medical History:  Diagnosis Date  . CHF (congestive heart failure) (HCC)    EF 20%  . Coronary artery disease   . GI bleed    Mallory-Weiss tear EGD 02/2010  . Hyperlipidemia   . Ileus (HCC) 2006   required hospitalization  . Left ventricular dysfunction    apical thrombus  . MRSA bacteremia   . 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 Jude  . Small bowel obstruction (HCC) 2007   resolved without surgery    Past Surgical History:  Procedure Laterality Date  . basilic vein left arm resection    . CARDIAC DEFIBRILLATOR PLACEMENT    . CORONARY ARTERY BYPASS GRAFT     6 vessels, 2000  . INSERT / REPLACE / REMOVE PACEMAKER  01/05/10   ICD insertion/St. Jude single chamber    ROS   As stated in the HPI and negative for all other  systems.   PHYSICAL EXAM BP 132/80   Pulse 72   Ht 5\' 11"  (1.803 m)   Wt 208 lb (94.3 kg) Comment: with left leg boot  BMI 29.01 kg/m  GENERAL:  Well appearing NECK:  No jugular venous distention, waveform within normal limits, carotid upstroke brisk and symmetric, no bruits, no thyromegaly LUNGS:  Clear to auscultation bilaterally CHEST: ICD scar, well-healed sternotomy scar HEART:  PMI not displaced or sustained,S1 and S2 within normal limits, no S3, no S4, no clicks, no rubs, soft apical systolic murmur radiating slightly at the aortic outflow tract into the axilla, no diastolic murmurs ABD:  Flat, positive bowel sounds normal in frequency in pitch, no bruits, no rebound, no guarding, no midline pulsatile mass, no hepatomegaly, no splenomegaly EXT:  2 plus pulses throughout, moderate leg edema,  no cyanosis no clubbing    EKG: NA   ASSESSMENT AND PLAN  Coronary artery disease -  The patient has no new sypmtoms.  No further cardiovascular testing is indicated.  We will continue with aggressive risk reduction and meds as listed.  CARDIOMYOPATHY -  EF was 35 - 40%.   Although I am not convinced that the Sherryll Burger is leading to his edema he is.  I will switch him back to enalapril 20 mg twice daily.  He will get a basic metabolic profile in 2 weeks.  He can get continued med titration at an upcoming appointment.  He is going to continue to watch his volume intake occasional torsemide as needed 20 mg extra would need to let us know because he does have renal insufficiency.  We will need to follow his labs carefully.   ICD - He is up-to-date with follow-up.  No change in therapy.  DYSLIPIDEMIA - His most recent LDL was 78 with an HDL of 49.  He will continue the meds as listed.  DM - His A1c last was 6.1.  No change in therapy.  CKD - Creatinine most recently was 1.57.  Follow closely as above.

## 2018-06-12 ENCOUNTER — Ambulatory Visit (INDEPENDENT_AMBULATORY_CARE_PROVIDER_SITE_OTHER): Payer: Medicare HMO | Admitting: Cardiology

## 2018-06-12 ENCOUNTER — Encounter: Payer: Self-pay | Admitting: Cardiology

## 2018-06-12 VITALS — BP 132/80 | HR 72 | Ht 71.0 in | Wt 208.0 lb

## 2018-06-12 DIAGNOSIS — N183 Chronic kidney disease, stage 3 unspecified: Secondary | ICD-10-CM

## 2018-06-12 DIAGNOSIS — I5021 Acute systolic (congestive) heart failure: Secondary | ICD-10-CM | POA: Diagnosis not present

## 2018-06-12 DIAGNOSIS — I10 Essential (primary) hypertension: Secondary | ICD-10-CM | POA: Diagnosis not present

## 2018-06-12 DIAGNOSIS — E785 Hyperlipidemia, unspecified: Secondary | ICD-10-CM

## 2018-06-12 MED ORDER — ENALAPRIL MALEATE 20 MG PO TABS: 20.0000 mg | ORAL_TABLET | Freq: Two times a day (BID) | ORAL | 3 refills | Status: DC

## 2018-06-12 NOTE — Patient Instructions (Signed)
Medication Instructions:  Please discontinue your Entresto and start Enalapril 20 mg twice a day. Continue all other medications as listed.  Labwork: Have blood work as scheduled.  Follow-Up: Follow up in 1 month with [place] in the [place] office.  If you need a refill on your cardiac medications before your next appointment, please call your pharmacy.  Thank you for choosing [facility]!!

## 2018-06-14 ENCOUNTER — Encounter: Payer: Self-pay | Admitting: Internal Medicine

## 2018-06-14 ENCOUNTER — Ambulatory Visit (INDEPENDENT_AMBULATORY_CARE_PROVIDER_SITE_OTHER): Payer: Medicare HMO | Admitting: Internal Medicine

## 2018-06-14 VITALS — BP 120/70 | HR 80 | Ht 71.0 in | Wt 206.2 lb

## 2018-06-14 DIAGNOSIS — I5022 Chronic systolic (congestive) heart failure: Secondary | ICD-10-CM | POA: Diagnosis not present

## 2018-06-14 NOTE — Patient Instructions (Signed)
Medication Instructions:  Your physician recommends that you continue on your current medications as directed. Please refer to the Current Medication list given to you today.   Labwork: NONE   Testing/Procedures: NONE   Follow-Up: Your physician wants you to follow-up in: 1 Year. You will receive a reminder letter in the mail two months in advance. If you don't receive a letter, please call our office to schedule the follow-up appointment.   Any Other Special Instructions Will Be Listed Below (If Applicable).     If you need a refill on your cardiac medications before your next appointment, please call your pharmacy.  Thank you for choosing [facility]!   

## 2018-06-14 NOTE — Progress Notes (Signed)
HPI Joseph [name] returns today for followup. He is a very pleasant 62 year old man with an ischemic cardiomyopathy, chronic systolic heart failure, ejection fraction 25%, status post ICD implantation. In the interim, he has done well except for a propensity to volume overload. He denies chest pain, shortness of breath, fevers, chills, or syncope. He admits to dietary indiscretion with sodium though he is trying to do better. He has broken his leg and has a brace.   No Known Allergies   Current Outpatient Medications  Medication Sig Dispense Refill  . acetaminophen (TYLENOL) 500 MG tablet Take 500 mg by mouth every 6 (six) hours as needed for pain.    Marland Kitchen albuterol (PROVENTIL HFA;VENTOLIN HFA) 108 (90 Base) MCG/ACT inhaler Inhale 2 puffs into the lungs every 4 (four) hours as needed. 6.7 g 0  . aspirin 81 MG tablet Take 81 mg by mouth at bedtime.     Marland Kitchen atorvastatin (LIPITOR) 80 MG tablet TAKE 1 TABLET BY MOUTH EVERY DAY IN THE EVENING 90 tablet 1  . benzonatate (TESSALON) 100 MG capsule Take by mouth 3 (three) times daily as needed for cough.    . dextromethorphan-guaiFENesin (MUCINEX DM) 30-600 MG per 12 hr tablet Take 1 tablet by mouth 2 (two) times daily as needed for cough.    . enalapril (VASOTEC) 20 MG tablet Take 1 tablet (20 mg total) by mouth 2 (two) times daily. 180 tablet 3  . fish oil-omega-3 fatty acids 1000 MG capsule Take 1 g by mouth 3 (three) times daily.     . furosemide (LASIX) 40 MG tablet Take 40 mg by mouth daily.    . metoprolol succinate (TOPROL-XL) 100 MG 24 hr tablet Take 100 mg by mouth daily. Take with or immediately following a meal.    . metoprolol tartrate (LOPRESSOR) 50 MG tablet Take 50 mg by mouth 3 (three) times daily.    . nitroGLYCERIN (NITROSTAT) 0.4 MG SL tablet Place 1 tablet (0.4 mg total) under the tongue every 5 (five) minutes as needed for chest pain. 25 tablet 3  . omeprazole (PRILOSEC) 20 MG capsule TAKE 1 CAPSULE BY MOUTH EVERY DAY 90 capsule 2    . potassium chloride SA (KLOR-CON M20) 20 MEQ tablet Take 1 tablet (20 mEq total) by mouth daily. 90 tablet 3  . torsemide (DEMADEX) 20 MG tablet Take 2 tablets (40 mg total) by mouth daily. 180 tablet 3   No current facility-administered medications for this visit.      Past Medical History:  Diagnosis Date  . CHF (congestive heart failure) (HCC)    EF 20%  . Coronary artery disease   . GI bleed    Mallory-Weiss tear EGD 02/2010  . Hyperlipidemia   . Ileus (HCC) 2006   required hospitalization  . Left ventricular dysfunction    apical thrombus  . MRSA bacteremia   . 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 Jude  . Small bowel obstruction (HCC) 2007   resolved without surgery    ROS:   All systems reviewed and negative except as noted in the HPI.   Past Surgical History:  Procedure Laterality Date  . basilic vein left arm resection    . CARDIAC DEFIBRILLATOR PLACEMENT    . CORONARY ARTERY BYPASS GRAFT     6 vessels, 2000  . INSERT / REPLACE / REMOVE PACEMAKER  01/05/10   ICD insertion/St. Jude single chamber     Family History  Problem  Relation Age of Onset  . Heart attack Mother   . Emphysema Father   . Coronary artery disease Father        s/p cabg  . Colon cancer Neg Hx      Social History   Socioeconomic History  . Marital status: Married    Spouse name: Not on file  . Number of children: 3  . Years of education: Not on file  . Highest education level: Not on file  Occupational History  . 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 Financial resource strain: Not on file  . Food insecurity:    Worry: Not on file    Inability: Not on file  . Transportation needs:    Medical: Not on file    Non-medical: Not on file  Tobacco Use  . Smoking status: Former Smoker    Types: Cigarettes    Start date: 11/28/1967    Last attempt to quit: 02/29/1988    Years since quitting: 30.3  .  Smokeless tobacco: Never Used  Substance and Sexual Activity  . Alcohol use: No    Alcohol/week: 0.0 oz  . Drug use: No  . Sexual activity: Never  Lifestyle  . Physical activity:    Days per week: Not on file    Minutes per session: Not on file  . Stress: Not on file  Relationships  . Social connections:    Talks on phone: Not on file    Gets together: Not on file    Attends religious service: Not on file    Active member of club or organization: Not on file    Attends meetings of clubs or organizations: Not on file    Relationship status: Not on file  . Intimate partner violence:    Fear of current or ex partner: Not on file    Emotionally abused: Not on file    Physically abused: Not on file    Forced sexual activity: Not on file  Other Topics Concern  . Not on file  Social History Narrative   1 son died age 47-Lee's syndrome           BP 120/70   Pulse 80   Ht 5\' 11"  (1.803 m)   Wt 206 lb 3.2 oz (93.5 kg)   SpO2 99% Comment: on room air  BMI 28.76 kg/m   Physical Exam:  Well appearing NAD HEENT: Unremarkable Neck:  No JVD, no thyromegally Lymphatics:  No adenopathy Back:  No CVA tenderness Lungs:  Clear HEART:  Regular rate rhythm, no murmurs, no rubs, no clicks Abd:  soft, positive bowel sounds, no organomegally, no rebound, no guarding Ext:  2 plus pulses, no edema, no cyanosis, no clubbing Skin:  No rashes no nodules Neuro:  CN II through XII intact, motor grossly intact  EKG -   DEVICE  Normal device function.  See PaceArt for details.   Assess/Plan: 1. Chronic systolic heart failure - his symptoms are class 2. He will continue his current meds.  2. ICD - his St Jude ICD is working normally. He will continue his current meds.

## 2018-06-17 ENCOUNTER — Encounter (INDEPENDENT_AMBULATORY_CARE_PROVIDER_SITE_OTHER): Payer: Self-pay | Admitting: Orthopedic Surgery

## 2018-06-17 ENCOUNTER — Ambulatory Visit (INDEPENDENT_AMBULATORY_CARE_PROVIDER_SITE_OTHER): Payer: Worker's Compensation

## 2018-06-17 ENCOUNTER — Ambulatory Visit (INDEPENDENT_AMBULATORY_CARE_PROVIDER_SITE_OTHER): Payer: Worker's Compensation | Admitting: Orthopedic Surgery

## 2018-06-17 VITALS — Ht 71.0 in | Wt 206.0 lb

## 2018-06-17 DIAGNOSIS — S92902A Unspecified fracture of left foot, initial encounter for closed fracture: Secondary | ICD-10-CM | POA: Diagnosis not present

## 2018-06-17 DIAGNOSIS — E1142 Type 2 diabetes mellitus with diabetic polyneuropathy: Secondary | ICD-10-CM | POA: Diagnosis not present

## 2018-06-17 NOTE — Progress Notes (Signed)
Office Visit Note   Patient: Joseph [name]           Date of Birth: 11-26-1956           MRN: [id number] Visit Date: 06/17/2018              Requested by: Joette Catching, MD [address] [place], [place] [address] PCP: [name], Elige Radon, MD  Chief Complaint  Patient presents with  . Left Foot - Fracture, Follow-up      HPI: Patient is a 62 year old gentleman who was seen in follow-up 2 months out from Lisfranc fracture dislocation ligamentous of the right foot he is currently full weightbearing in a cam boot.  He states he feels a lot better he states he still has some swelling he states he has walked some without the boot without any problems.  He states he is ready to return to work.  Assessment & Plan: Visit Diagnoses:  1. Fracture of foot, left, closed, initial encounter   2. Diabetic polyneuropathy associated with type 2 diabetes mellitus (HCC)     Plan: Patient is given a note to return to work without restrictions he is to use his steel toed work boots at all times to stabilize the midfoot.  3 view radiographs of the left foot at follow-up in 4 weeks.  Follow-Up Instructions: Return in about 1 month (around 07/15/2018).   Ortho Exam  Patient is alert, oriented, no adenopathy, well-dressed, normal affect, normal respiratory effort. Examination of the left foot patient's foot is plantigrade there is no redness no cellulitis no tenderness with distraction across the Lisfranc complex no signs of instability or infection.  Imaging: Xr Foot Complete Left  Result Date: 06/17/2018 3 view radiographs of the left foot shows widening of the Lisfranc complex but no further displacement no fractures of the bone.  No images are attached to the encounter.  Labs: Lab Results  Component Value Date   HGBA1C (H) 03/17/2010    6.1 (NOTE)                                                                       According to the ADA Clinical Practice Recommendations for 2011, when  HbA1c is used as a screening test:   >=6.5%   Diagnostic of Diabetes Mellitus           (if abnormal result  is confirmed)  5.7-6.4%   Increased risk of developing Diabetes Mellitus  References:Diagnosis and Classification of Diabetes Mellitus,Diabetes Care,2011,34(Suppl 1):S62-S69 and Standards of Medical Care in         Diabetes - 2011,Diabetes Care,2011,34  (Suppl 1):S11-S61.   REPTSTATUS 03/19/2010 FINAL 03/16/2010   GRAMSTAIN  03/16/2010    ABUNDANT WBC PRESENT,BOTH PMN AND MONONUCLEAR NO SQUAMOUS EPITHELIAL CELLS SEEN RARE GRAM POSITIVE COCCI IN PAIRS IN CLUSTERS   CULT  03/16/2010    FEW METHICILLIN RESISTANT STAPHYLOCOCCUS AUREUS Note: SUSCEPTIBILITIES PERFORMED ON PREVIOUS CULTURE WITHIN THE LAST 5 DAYS. CRITICAL RESULT CALLED TO, READ BACK BY AND VERIFIED WITH: LOLLY HATTLE @1109AM  03/19/10 MURPE   LABORGA METHICILLIN RESISTANT STAPHYLOCOCCUS AUREUS 03/12/2010     Lab Results  Component Value Date   ALBUMIN 3.4 (L) 10/28/2017   ALBUMIN 3.5 03/27/2016   ALBUMIN 4.0  12/31/2015    Body mass index is 28.73 kg/m.  Orders:  Orders Placed This Encounter  Procedures  . XR Foot Complete Left   No orders of the defined types were placed in this encounter.    Procedures: No procedures performed  Clinical Data: No additional findings.  ROS:  All other systems negative, except as noted in the HPI. Review of Systems  Objective: Vital Signs: Ht 5\' 11"  (1.803 m)   Wt 206 lb (93.4 kg)   BMI 28.73 kg/m   Specialty Comments:  No specialty comments available.  PMFS History: Patient Active Problem List   Diagnosis Date Noted  . Dyslipidemia 01/09/2018  . Heme + stool 09/10/2017  . Stage 3 chronic kidney disease (HCC) 08/15/2017  . Essential hypertension   . Acute systolic HF (heart failure) (HCC) 01/16/2015  . CHF (congestive heart failure) (HCC) 01/16/2015  . Arteriosclerosis of coronary artery 08/05/2014  . Essential (primary) hypertension 08/05/2014  . Acid  reflux 08/05/2014  . Hx of CABG 09/06/2011  . Chronic systolic heart failure (HCC) 05/02/2011  . LAD stenosis   . Coronary artery disease   . BACTEREMIA 03/30/2010  . Automatic implantable cardioverter-defibrillator in situ 01/12/2010  . Other specified forms of chronic ischemic heart disease 11/03/2009  . Cardiomyopathy, ischemic 11/03/2009  . HYPERLIPIDEMIA 02/02/2009   Past Medical History:  Diagnosis Date  . CHF (congestive heart failure) (HCC)    EF 20%  . Coronary artery disease   . GI bleed    Mallory-Weiss tear EGD 02/2010  . Hyperlipidemia   . Ileus (HCC) 2006   required hospitalization  . Left ventricular dysfunction    apical thrombus  . MRSA bacteremia   . 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 Jude  . Small bowel obstruction (HCC) 2007   resolved without surgery    Family History  Problem Relation Age of Onset  . Heart attack Mother   . Emphysema Father   . Coronary artery disease Father        s/p cabg  . Colon cancer Neg Hx     Past Surgical History:  Procedure Laterality Date  . basilic vein left arm resection    . CARDIAC DEFIBRILLATOR PLACEMENT    . CORONARY ARTERY BYPASS GRAFT     6 vessels, 2000  . INSERT / REPLACE / REMOVE PACEMAKER  01/05/10   ICD insertion/St. Jude single chamber   Social History   Occupational History  . 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 Smoking status: Former Smoker    Types: Cigarettes    Start date: 11/28/1967    Last attempt to quit: 02/29/1988    Years since quitting: 30.3  . Smokeless tobacco: Never Used  Substance and Sexual Activity  . Alcohol use: No    Alcohol/week: 0.0 oz  . Drug use: No  . Sexual activity: Never

## 2018-06-18 ENCOUNTER — Encounter: Payer: Self-pay | Admitting: Internal Medicine

## 2018-06-20 ENCOUNTER — Telehealth: Payer: Self-pay | Admitting: Gastroenterology

## 2018-06-20 NOTE — Telephone Encounter (Signed)
Noted  

## 2018-06-20 NOTE — Telephone Encounter (Signed)
Patient came in for appointment today and was told it was for the 06/21/18.  He said that his letter said the 25th.  I printed off his letter and he told me "You can change what you want on the computer"  I asked him is he was going to come in for his appointment Friday and he said "Cancel it" I cancelled the appointment.

## 2018-06-21 ENCOUNTER — Ambulatory Visit: Payer: Medicare HMO | Admitting: Gastroenterology

## 2018-07-01 ENCOUNTER — Ambulatory Visit (INDEPENDENT_AMBULATORY_CARE_PROVIDER_SITE_OTHER): Payer: Medicare HMO | Admitting: *Deleted

## 2018-07-01 DIAGNOSIS — I5022 Chronic systolic (congestive) heart failure: Secondary | ICD-10-CM | POA: Diagnosis not present

## 2018-07-02 NOTE — Progress Notes (Signed)
Remote ICD transmission.   

## 2018-07-05 ENCOUNTER — Encounter: Payer: Self-pay | Admitting: Family Medicine

## 2018-07-05 ENCOUNTER — Ambulatory Visit (INDEPENDENT_AMBULATORY_CARE_PROVIDER_SITE_OTHER): Payer: Medicare HMO | Admitting: Family Medicine

## 2018-07-05 VITALS — BP 120/81 | HR 69 | Temp 97.2°F | Ht 70.0 in | Wt 200.2 lb

## 2018-07-05 DIAGNOSIS — N183 Chronic kidney disease, stage 3 unspecified: Secondary | ICD-10-CM

## 2018-07-05 DIAGNOSIS — I152 Hypertension secondary to endocrine disorders: Secondary | ICD-10-CM

## 2018-07-05 DIAGNOSIS — K219 Gastro-esophageal reflux disease without esophagitis: Secondary | ICD-10-CM | POA: Diagnosis not present

## 2018-07-05 DIAGNOSIS — I5022 Chronic systolic (congestive) heart failure: Secondary | ICD-10-CM

## 2018-07-05 DIAGNOSIS — I10 Essential (primary) hypertension: Secondary | ICD-10-CM

## 2018-07-05 DIAGNOSIS — E1159 Type 2 diabetes mellitus with other circulatory complications: Secondary | ICD-10-CM | POA: Diagnosis not present

## 2018-07-05 DIAGNOSIS — Z95810 Presence of automatic (implantable) cardiac defibrillator: Secondary | ICD-10-CM | POA: Diagnosis not present

## 2018-07-05 DIAGNOSIS — E1122 Type 2 diabetes mellitus with diabetic chronic kidney disease: Secondary | ICD-10-CM

## 2018-07-05 DIAGNOSIS — E1169 Type 2 diabetes mellitus with other specified complication: Secondary | ICD-10-CM

## 2018-07-05 DIAGNOSIS — E785 Hyperlipidemia, unspecified: Secondary | ICD-10-CM | POA: Diagnosis not present

## 2018-07-05 NOTE — Progress Notes (Signed)
BP 120/81   Pulse 69   Temp (!) 97.2 F (36.2 C) (Oral)   Ht 5\' 10"  (1.778 m)   Wt 200 lb 3.2 oz (90.8 kg)   BMI 28.73 kg/m    Subjective:    Patient ID: Joseph [name], male    DOB: 1956/08/24, 62 y.o.   MRN: [id number]  HPI: Joseph [name] is a 62 y.o. male presenting on 07/05/2018 for New Patient (Initial Visit) (est care- Dr. Lysbeth Galas- Check up of chronic medical conditions)   HPI Hyperlipidemia Patient is coming in for recheck of his hyperlipidemia. The patient is currently taking Lipitor. They deny any issues with myalgias or history of liver damage from it. They deny any focal numbness or weakness or chest pain.   Type 2 diabetes mellitus Patient comes in today for recheck of his diabetes. Patient has been currently taking diet control for now, we are monitoring. Patient is currently on an ACE inhibitor/ARB. Patient has not seen an ophthalmologist this year. Patient denies any issues with their feet.  Patient does have known previous cardiac disease and CHF and has an AICD in place.  Patient also has known stage III renal disease.  GERD Patient is currently on omeprazole.  She denies any major symptoms or abdominal pain or belching or burping. She denies any blood in her stool or lightheadedness or dizziness.   Hypertension and CHF Patient is currently on enalapril and metoprolol and torsemide, and their blood pressure today is 120/81. Patient denies any lightheadedness or dizziness. Patient denies headaches, blurred vision, chest pains, shortness of breath, or weakness. Denies any side effects from medication and is content with current medication.  Patient does have a cardiologist that he follows for this and denies any difficulties with swallowing or breathing currently  Relevant past medical, surgical, family and social history reviewed and updated as indicated. Interim medical history since our last visit reviewed. Allergies and medications reviewed and updated.  Review of  Systems  Constitutional: Negative for chills and fever.  Eyes: Negative for visual disturbance.  Respiratory: Negative for cough, shortness of breath and wheezing.   Cardiovascular: Negative for chest pain, palpitations and leg swelling.  Musculoskeletal: Negative for back pain and gait problem.  Skin: Negative for rash.  Neurological: Negative for dizziness, weakness, light-headedness and headaches.  All other systems reviewed and are negative.   Per HPI unless specifically indicated above  Social History   Socioeconomic History  . Marital status: Married    Spouse name: Not on file  . Number of children: 3  . Years of education: Not on file  . Highest education level: Not on file  Occupational History  . 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 Financial resource strain: Not on file  . Food insecurity:    Worry: Not on file    Inability: Not on file  . Transportation needs:    Medical: Not on file    Non-medical: Not on file  Tobacco Use  . Smoking status: Former Smoker    Types: Cigarettes    Start date: 11/28/1967    Last attempt to quit: 02/29/1988    Years since quitting: 30.3  . Smokeless tobacco: Never Used  Substance and Sexual Activity  . Alcohol use: No    Alcohol/week: 0.0 standard drinks  . Drug use: No  . Sexual activity: Never  Lifestyle  . Physical activity:    Days per week: Not on file  Minutes per session: Not on file  . Stress: Not on file  Relationships  . Social connections:    Talks on phone: Not on file    Gets together: Not on file    Attends religious service: Not on file    Active member of club or organization: Not on file    Attends meetings of clubs or organizations: Not on file    Relationship status: Not on file  . Intimate partner violence:    Fear of current or ex partner: Not on file    Emotionally abused: Not on file    Physically abused: Not on file    Forced sexual activity: Not on file  Other  Topics Concern  . Not on file  Social History Narrative   1 son died age 42-Lee's syndrome          Past Surgical History:  Procedure Laterality Date  . basilic vein left arm resection    . CARDIAC DEFIBRILLATOR PLACEMENT    . CORONARY ARTERY BYPASS GRAFT     6 vessels, 2000  . INSERT / REPLACE / REMOVE PACEMAKER  01/05/10   ICD insertion/St. Jude single chamber    Family History  Problem Relation Age of Onset  . Heart attack Mother   . Emphysema Father   . Coronary artery disease Father        s/p cabg  . Colon cancer Neg Hx     Allergies as of 07/05/2018   No Known Allergies     Medication List        Accurate as of 07/05/18 10:09 AM. Always use your most recent med list.          acetaminophen 500 MG tablet Commonly known as:  TYLENOL Take 500 mg by mouth every 6 (six) hours as needed for pain.   albuterol 108 (90 Base) MCG/ACT inhaler Commonly known as:  PROVENTIL HFA;VENTOLIN HFA Inhale 2 puffs into the lungs every 4 (four) hours as needed.   aspirin 81 MG tablet Take 81 mg by mouth at bedtime.   atorvastatin 80 MG tablet Commonly known as:  LIPITOR TAKE 1 TABLET BY MOUTH EVERY DAY IN THE EVENING   benzonatate 100 MG capsule Commonly known as:  TESSALON Take by mouth 3 (three) times daily as needed for cough.   dextromethorphan-guaiFENesin 30-600 MG 12hr tablet Commonly known as:  MUCINEX DM Take 1 tablet by mouth 2 (two) times daily as needed for cough.   enalapril 20 MG tablet Commonly known as:  VASOTEC Take 1 tablet (20 mg total) by mouth 2 (two) times daily.   fish oil-omega-3 fatty acids 1000 MG capsule Take 1 g by mouth 3 (three) times daily.   metoprolol tartrate 50 MG tablet Commonly known as:  LOPRESSOR Take 50 mg by mouth 3 (three) times daily.   nitroGLYCERIN 0.4 MG SL tablet Commonly known as:  NITROSTAT Place 1 tablet (0.4 mg total) under the tongue every 5 (five) minutes as needed for chest pain.   omeprazole 20 MG  capsule Commonly known as:  PRILOSEC TAKE 1 CAPSULE BY MOUTH EVERY DAY   potassium chloride SA 20 MEQ tablet Commonly known as:  K-DUR,KLOR-CON Take 1 tablet (20 mEq total) by mouth daily.   torsemide 20 MG tablet Commonly known as:  DEMADEX Take 2 tablets (40 mg total) by mouth daily.          Objective:    BP 120/81   Pulse 69   Temp (!)  97.2 F (36.2 C) (Oral)   Ht 5\' 10"  (1.778 m)   Wt 200 lb 3.2 oz (90.8 kg)   BMI 28.73 kg/m   Wt Readings from Last 3 Encounters:  07/05/18 200 lb 3.2 oz (90.8 kg)  06/17/18 206 lb (93.4 kg)  06/14/18 206 lb 3.2 oz (93.5 kg)    Physical Exam  Constitutional: He is oriented to person, place, and time. He appears well-developed and well-nourished. No distress.  Eyes: Conjunctivae are normal. No scleral icterus.  Neck: Neck supple. No thyromegaly present.  Cardiovascular: Normal rate, regular rhythm, normal heart sounds and intact distal pulses.  No murmur heard. Pulmonary/Chest: Effort normal and breath sounds normal. No respiratory distress. He has no wheezes.  Musculoskeletal: Normal range of motion. He exhibits no edema.  Lymphadenopathy:    He has no cervical adenopathy.  Neurological: He is alert and oriented to person, place, and time. Coordination normal.  Skin: Skin is warm and dry. No rash noted. He is not diaphoretic.  Psychiatric: He has a normal mood and affect. His behavior is normal.  Nursing note and vitals reviewed.       Assessment & Plan:   Problem List Items Addressed This Visit      Cardiovascular and Mediastinum   Chronic systolic heart failure (HCC)   Hypertension associated with diabetes (HCC)     Digestive   Acid reflux     Endocrine   Hyperlipidemia associated with type 2 diabetes mellitus (HCC) - Primary   Controlled diabetes mellitus with stage 3 chronic kidney disease (HCC)     Other   Automatic implantable cardioverter-defibrillator in situ     Continue medications from previous.    Follow up plan: Return in about 6 months (around 01/05/2019), or if symptoms worsen or fail to improve.  Arville Care, MD [facility] 07/05/2018, 10:09 AM

## 2018-07-18 ENCOUNTER — Ambulatory Visit (INDEPENDENT_AMBULATORY_CARE_PROVIDER_SITE_OTHER): Payer: Medicare HMO | Admitting: Student

## 2018-07-18 ENCOUNTER — Encounter: Payer: Self-pay | Admitting: Student

## 2018-07-18 VITALS — BP 128/76 | HR 65 | Ht 71.0 in | Wt 203.0 lb

## 2018-07-18 DIAGNOSIS — I5042 Chronic combined systolic (congestive) and diastolic (congestive) heart failure: Secondary | ICD-10-CM | POA: Diagnosis not present

## 2018-07-18 DIAGNOSIS — N183 Chronic kidney disease, stage 3 unspecified: Secondary | ICD-10-CM

## 2018-07-18 DIAGNOSIS — E785 Hyperlipidemia, unspecified: Secondary | ICD-10-CM | POA: Diagnosis not present

## 2018-07-18 DIAGNOSIS — I2510 Atherosclerotic heart disease of native coronary artery without angina pectoris: Secondary | ICD-10-CM

## 2018-07-18 DIAGNOSIS — I255 Ischemic cardiomyopathy: Secondary | ICD-10-CM

## 2018-07-18 DIAGNOSIS — I10 Essential (primary) hypertension: Secondary | ICD-10-CM | POA: Diagnosis not present

## 2018-07-18 MED ORDER — METOPROLOL TARTRATE 50 MG PO TABS: 50.0000 mg | ORAL_TABLET | Freq: Three times a day (TID) | ORAL | 3 refills | Status: DC

## 2018-07-18 NOTE — Progress Notes (Signed)
Cardiology Office Note    Date:  07/18/2018   ID:  Quatez, Hilt [birth year], MRN [id number]  PCP:  [name], Elige Radon, MD  Cardiologist: Rollene Rotunda, MD   EP: Dr. Ladona Ridgel  Chief Complaint  Patient presents with  . Follow-up    1 month visit    History of Present Illness:    Joseph [name] is a 62 y.o. male with past medical history of CAD (s/p CABG in 2000), chronic combined systolic and diastolic CHF (EF 59-16% in 2016, at 35-40% by echo in 10/2017), Ischemic cardiomyopathy (s/p St. Jude ICD placement in 2011), HTN, HLD, Type 2 DM (diet-controlled) and Stage 3 CKD who presents to the office today for 39-month follow-up.  He was last examined by Dr. Antoine Poche in 05/2018 and reported having a 14 pound weight gain with worsening edema within the past several months which he thought was due to the initiation of Entresto. Sherryll Burger was therefore discontinued and he was switched to Enalapril 20 mg twice daily. Repeat labs on 06/27/2018 showed that his creatinine had trended down to 1.38 (previously 1.57 in 05/2018).   In talking with the patient and his wife today, he reports overall doing well since his last office visit. Weight has declined by 10 pounds on his home scales and he is now down to 195 - 196 lbs. Says he was previously only urinating 1-2 times daily while on Entresto and this has increased to 5-6 times per day. He does not follow blood pressure regularly at home but this is this was checked by his PCP last week and within a normal range. BP is at 128/76 during today's visit.  He denies any recent chest pain, dyspnea on exertion, orthopnea, PND, or palpitations. He does have chronic lower extremity edema which acutely worsened along his left ankle following a recent fracture. Reports that he had frequently been wearing compression stockings but has not utilized these within the past few weeks.   Past Medical History:  Diagnosis Date  . CHF (congestive heart failure) (HCC)    a. EF 25-30% in 2016 b. 35-40% by echo in 10/2017  . COPD (chronic obstructive pulmonary disease) (HCC)   . Coronary artery disease   . GI bleed    Mallory-Weiss tear EGD 02/2010  . Hyperlipidemia   . Hypertension   . Ileus (HCC) 2006   required hospitalization  . Left ventricular dysfunction    apical thrombus  . MRSA bacteremia   . 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 Jude  . Small bowel obstruction (HCC) 2007   resolved without surgery    Past Surgical History:  Procedure Laterality Date  . basilic vein left arm resection    . CARDIAC DEFIBRILLATOR PLACEMENT    . CORONARY ARTERY BYPASS GRAFT     6 vessels, 2000  . INSERT / REPLACE / REMOVE PACEMAKER  01/05/10   ICD insertion/St. Jude single chamber    Current Medications: Outpatient Medications Prior to Visit  Medication Sig Dispense Refill  . acetaminophen (TYLENOL) 500 MG tablet Take 500 mg by mouth every 6 (six) hours as needed for pain.    Marland Kitchen albuterol (PROVENTIL HFA;VENTOLIN HFA) 108 (90 Base) MCG/ACT inhaler Inhale 2 puffs into the lungs every 4 (four) hours as needed. 6.7 g 0  . aspirin 81 MG tablet Take 81 mg by mouth at bedtime.     Marland Kitchen atorvastatin (LIPITOR) 80 MG tablet TAKE 1 TABLET BY MOUTH EVERY DAY  IN THE EVENING 90 tablet 1  . enalapril (VASOTEC) 20 MG tablet Take 1 tablet (20 mg total) by mouth 2 (two) times daily. 180 tablet 3  . fish oil-omega-3 fatty acids 1000 MG capsule Take 1 g by mouth 3 (three) times daily.     . Multiple Vitamin (MULTIVITAMIN) tablet Take 1 tablet by mouth daily. With FeSo4    . nitroGLYCERIN (NITROSTAT) 0.4 MG SL tablet Place 1 tablet (0.4 mg total) under the tongue every 5 (five) minutes as needed for chest pain. 25 tablet 3  . omeprazole (PRILOSEC) 20 MG capsule TAKE 1 CAPSULE BY MOUTH EVERY DAY 90 capsule 2  . potassium chloride SA (KLOR-CON M20) 20 MEQ tablet Take 1 tablet (20 mEq total) by mouth daily. 90 tablet 3  . metoprolol  tartrate (LOPRESSOR) 50 MG tablet Take 50 mg by mouth 3 (three) times daily.    Marland Kitchen torsemide (DEMADEX) 20 MG tablet Take 2 tablets (40 mg total) by mouth daily. 180 tablet 3  . benzonatate (TESSALON) 100 MG capsule Take by mouth 3 (three) times daily as needed for cough.    . dextromethorphan-guaiFENesin (MUCINEX DM) 30-600 MG per 12 hr tablet Take 1 tablet by mouth 2 (two) times daily as needed for cough.     No facility-administered medications prior to visit.      Allergies:   Patient has no known allergies.   Social History   Socioeconomic History  . Marital status: Married    Spouse name: Not on file  . Number of children: 3  . Years of education: Not on file  . Highest education level: Not on file  Occupational History  . 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 Financial resource strain: Not on file  . Food insecurity:    Worry: Not on file    Inability: Not on file  . Transportation needs:    Medical: Not on file    Non-medical: Not on file  Tobacco Use  . Smoking status: Former Smoker    Types: Cigarettes    Start date: 11/28/1967    Last attempt to quit: 02/29/1988    Years since quitting: 30.4  . Smokeless tobacco: Never Used  Substance and Sexual Activity  . Alcohol use: No    Alcohol/week: 0.0 standard drinks  . Drug use: No  . Sexual activity: Never  Lifestyle  . Physical activity:    Days per week: Not on file    Minutes per session: Not on file  . Stress: Not on file  Relationships  . Social connections:    Talks on phone: Not on file    Gets together: Not on file    Attends religious service: Not on file    Active member of club or organization: Not on file    Attends meetings of clubs or organizations: Not on file    Relationship status: Not on file  Other Topics Concern  . Not on file  Social History Narrative   1 son died age 65-Lee's syndrome           Family History:  The patient's family history includes Coronary  artery disease in his father; Emphysema in his father; Heart attack in his mother.   Review of Systems:   Please see the history of present illness.     General:  No chills, fever, or night sweats. Positive for weight loss (10 lbs).  Cardiovascular:  No chest pain, dyspnea on  exertion, orthopnea, palpitations, paroxysmal nocturnal dyspnea. Positive for edema.  Dermatological: No rash, lesions/masses Respiratory: No cough, dyspnea Urologic: No hematuria, dysuria Abdominal:   No nausea, vomiting, diarrhea, bright red blood per rectum, melena, or hematemesis Neurologic:  No visual changes, wkns, changes in mental status. All other systems reviewed and are otherwise negative except as noted above.   Physical Exam:    VS:  BP 128/76   Pulse 65   Ht 5\' 11"  (1.803 m)   Wt 203 lb (92.1 kg)   SpO2 97%   BMI 28.31 kg/m    General: Well developed, well nourished Caucasian male appearing in no acute distress. Head: Normocephalic, atraumatic, sclera non-icteric, no xanthomas, nares are without discharge.  Neck: No carotid bruits. JVD not elevated.  Lungs: Respirations regular and unlabored, without wheezes or rales.  Heart: Regular rate and rhythm. No S3 or S4.  No murmur, no rubs, or gallops appreciated. Abdomen: Soft, non-tender, non-distended with normoactive bowel sounds. No hepatomegaly. No rebound/guarding. No obvious abdominal masses. Msk:  Strength and tone appear normal for age. No joint deformities or effusions. Extremities: No clubbing or cyanosis. 1+ pitting edema up to mid-shins bilaterally. Most notable along left ankle.  Distal pedal pulses are 2+ bilaterally. Neuro: Alert and oriented X 3. Moves all extremities spontaneously. No focal deficits noted. Psych:  Responds to questions appropriately with a normal affect. Skin: No rashes or lesions noted  Wt Readings from Last 3 Encounters:  07/18/18 203 lb (92.1 kg)  07/05/18 200 lb 3.2 oz (90.8 kg)  06/17/18 206 lb (93.4 kg)       Studies/Labs Reviewed:   EKG:  EKG is not ordered today.   Recent Labs: 10/28/2017: ALT 32; B Natriuretic Peptide 432.0; BUN 14; Creatinine, Ser 1.37; Hemoglobin 13.6; Platelets 206; Potassium 4.0; Sodium 139   Lipid Panel    Component Value Date/Time   CHOL  03/08/2010 0411    81        ATP III CLASSIFICATION:  <200     mg/dL   Desirable  161-096  mg/dL   Borderline High  >=045    mg/dL   High          TRIG 50 03/08/2010 0411   HDL 25 (L) 03/08/2010 0411   CHOLHDL 3.2 03/08/2010 0411   VLDL 10 03/08/2010 0411   LDLCALC  03/08/2010 0411    46        Total Cholesterol/HDL:CHD Risk Coronary Heart Disease Risk Table                     Men   Women  1/2 Average Risk   3.4   3.3  Average Risk       5.0   4.4  2 X Average Risk   9.6   7.1  3 X Average Risk  23.4   11.0        Use the calculated Patient Ratio above and the CHD Risk Table to determine the patient's CHD Risk.        ATP III CLASSIFICATION (LDL):  <100     mg/dL   Optimal  409-811  mg/dL   Near or Above                    Optimal  130-159  mg/dL   Borderline  914-782  mg/dL   High  >956     mg/dL   Very High    Additional studies/ records that were  reviewed today include:   Echocardiogram: 10/2017 Study Conclusions  - Left ventricle: The cavity size was moderately dilated. There was   mild concentric hypertrophy. Systolic function was moderately   reduced. The estimated ejection fraction was in the range of 35%   to 40%. Diffuse hypokinesis. Features are consistent with a   pseudonormal left ventricular filling pattern, with concomitant   abnormal relaxation and increased filling pressure (grade 2   diastolic dysfunction). Indeterminate filling pressures. - Regional wall motion abnormality: Akinesis of the mid anterior,   mid anteroseptal, and mid inferoseptal myocardium; severe   hypokinesis of the basal anteroseptal and mid inferior   myocardium; mild hypokinesis of the mid anterolateral  myocardium. - Mitral valve: There was mild to moderate regurgitation. - Left atrium: The atrium was mildly dilated. - Right ventricle: Pacer wire or catheter noted in right ventricle.   Systolic function was mildly to moderately reduced. - Right atrium: Pacer wire or catheter noted in right atrium. - Atrial septum: No defect or patent foramen ovale was identified. - Tricuspid valve: There was mild regurgitation.   Assessment:    1. Coronary artery disease involving native coronary artery of native heart without angina pectoris   2. Chronic combined systolic and diastolic heart failure (HCC)   3. Ischemic cardiomyopathy   4. Essential hypertension   5. Dyslipidemia   6. Stage 3 chronic kidney disease (HCC)      Plan:   In order of problems listed above:  1. CAD - s/p CABG in 2000. Most recent echo showed his EF had improved from 25-30% in 2016 to 35-40% in 10/2017. He denies any recent chest pain or dyspnea on exertion.  - continue ASA, statin, and BB therapy.   2. Chronic Combined Systolic and Diastolic CHF/Ischemic Cardiomyopathy - EF 35-40% by echo in 10/2017, s/p St. Jude ICD placement in 2011. Device followed by Dr. Ladona Ridgel.  - he previously had decreased urination and weight gain with Sherryll Burger which prompted switching back to Enalapril 20mg  BID. Symptoms have improved and he reports weight is back to baseline of 195 - 196 lbs on his home scales.  - will continue Enalapril 20mg  BID and Lopressor. Previously prescribed Toprol-XL but informed his co-pay was going to be $60 per month which was unaffordable for him. Therefore, he has remained on Lopressor 50mg  BID.   3. HTN - BP well-controlled at 128/76 during today's visit.  - continue current medication regimen.  4. HLD - followed by PCP. FLP on 06/27/2018 showed total cholesterol of 114, HDL 36, and LDL 63. At goal of LDL less than 70. Remains on Atorvastatin 80mg  daily.   5. Stage 3 CKD  - recent labs on 06/27/2018 showed  that his creatinine had trended down to 1.38 (previously 1.57 in 05/2018).  - will continue to follow.    Medication Adjustments/Labs and Tests Ordered: Current medicines are reviewed at length with the patient today.  Concerns regarding medicines are outlined above.  Medication changes, Labs and Tests ordered today are listed in the Patient Instructions below. Patient Instructions  Medication Instructions:  Your physician recommends that you continue on your current medications as directed. Please refer to the Current Medication list given to you today.   Labwork: NONE   Testing/Procedures: NONE   Follow-Up: Your physician recommends that you schedule a follow-up appointment in: 4 Months   Any Other Special Instructions Will Be Listed Below (If Applicable).  If you need a refill on your cardiac medications before your next  appointment, please call your pharmacy.  Thank you for choosing [facility]!    Signed, Ellsworth Lennox, PA-C  07/18/2018 9:14 PM    [facility] [address] [place], [place] [address] Phone: [phone]

## 2018-07-18 NOTE — Patient Instructions (Signed)
Medication Instructions:  Your physician recommends that you continue on your current medications as directed. Please refer to the Current Medication list given to you today.   Labwork: NONE   Testing/Procedures: NONE   Follow-Up: Your physician recommends that you schedule a follow-up appointment in: 4 Months    Any Other Special Instructions Will Be Listed Below (If Applicable).     If you need a refill on your cardiac medications before your next appointment, please call your pharmacy.  Thank you for choosing [facility]!   

## 2018-07-22 ENCOUNTER — Ambulatory Visit (INDEPENDENT_AMBULATORY_CARE_PROVIDER_SITE_OTHER): Payer: Worker's Compensation | Admitting: Orthopedic Surgery

## 2018-07-22 ENCOUNTER — Encounter (INDEPENDENT_AMBULATORY_CARE_PROVIDER_SITE_OTHER): Payer: Self-pay | Admitting: Orthopedic Surgery

## 2018-07-22 ENCOUNTER — Ambulatory Visit (INDEPENDENT_AMBULATORY_CARE_PROVIDER_SITE_OTHER): Payer: Worker's Compensation

## 2018-07-22 VITALS — Ht 71.0 in | Wt 203.0 lb

## 2018-07-22 DIAGNOSIS — S92902A Unspecified fracture of left foot, initial encounter for closed fracture: Secondary | ICD-10-CM | POA: Diagnosis not present

## 2018-07-22 DIAGNOSIS — M79672 Pain in left foot: Secondary | ICD-10-CM

## 2018-07-22 DIAGNOSIS — E1142 Type 2 diabetes mellitus with diabetic polyneuropathy: Secondary | ICD-10-CM

## 2018-07-22 NOTE — Progress Notes (Signed)
Office Visit Note   Patient: Joseph [name]           Date of Birth: 1956/08/08           MRN: [id number] Visit Date: 07/22/2018              Requested by: [name], Elige Radon, MD [address] [place], [place] [address] PCP: [name], Elige Radon, MD  Chief Complaint  Patient presents with  . Left Foot - Follow-up    Lisfranc fracture dislocation       HPI: Patient is a 62 year old gentleman who presents 3 months out from Lisfranc sprain of the left midfoot.  Patient is currently in regular shoewear he has returned to work he notes some swelling but overall states he is doing fine has no concerns no pain with ambulation.  Assessment & Plan: Visit Diagnoses:  1. Pain in left foot   2. Fracture of foot, left, closed, initial encounter   3. Diabetic polyneuropathy associated with type 2 diabetes mellitus (HCC)     Plan: Recommended knee-high compression stockings for his venous insufficiency there are no ulcers.  Continue with regular shoewear no restrictions with activities follow-up as needed.  Follow-Up Instructions: Return if symptoms worsen or fail to improve.   Ortho Exam  Patient is alert, oriented, no adenopathy, well-dressed, normal affect, normal respiratory effort. Examination patient has a good pulse.  His foot is plantigrade he has no pain with distraction across the Lisfranc complex.  There is no rocker-bottom deformity no signs or symptoms of Charcot arthropathy.  Patient has an antalgic gait.  Imaging: No results found. No images are attached to the encounter.  Labs: Lab Results  Component Value Date   HGBA1C (H) 03/17/2010    6.1 (NOTE)                                                                       According to the ADA Clinical Practice Recommendations for 2011, when HbA1c is used as a screening test:   >=6.5%   Diagnostic of Diabetes Mellitus           (if abnormal result  is confirmed)  5.7-6.4%   Increased risk of developing Diabetes Mellitus   References:Diagnosis and Classification of Diabetes Mellitus,Diabetes Care,2011,34(Suppl 1):S62-S69 and Standards of Medical Care in         Diabetes - 2011,Diabetes Care,2011,34  (Suppl 1):S11-S61.   REPTSTATUS 03/19/2010 FINAL 03/16/2010   GRAMSTAIN  03/16/2010    ABUNDANT WBC PRESENT,BOTH PMN AND MONONUCLEAR NO SQUAMOUS EPITHELIAL CELLS SEEN RARE GRAM POSITIVE COCCI IN PAIRS IN CLUSTERS   CULT  03/16/2010    FEW METHICILLIN RESISTANT STAPHYLOCOCCUS AUREUS Note: SUSCEPTIBILITIES PERFORMED ON PREVIOUS CULTURE WITHIN THE LAST 5 DAYS. CRITICAL RESULT CALLED TO, READ BACK BY AND VERIFIED WITH: LOLLY HATTLE @1109AM  03/19/10 MURPE   LABORGA METHICILLIN RESISTANT STAPHYLOCOCCUS AUREUS 03/12/2010     Lab Results  Component Value Date   ALBUMIN 3.4 (L) 10/28/2017   ALBUMIN 3.5 03/27/2016   ALBUMIN 4.0 12/31/2015    Body mass index is 28.31 kg/m.  Orders:  Orders Placed This Encounter  Procedures  . XR Foot Complete Left   No orders of the defined types were placed in this encounter.  Procedures: No procedures performed  Clinical Data: No additional findings.  ROS:  All other systems negative, except as noted in the HPI. Review of Systems  Objective: Vital Signs: Ht 5\' 11"  (1.803 m)   Wt 203 lb (92.1 kg)   BMI 28.31 kg/m   Specialty Comments:  No specialty comments available.  PMFS History: Patient Active Problem List   Diagnosis Date Noted  . Heme + stool 09/10/2017  . Controlled diabetes mellitus with stage 3 chronic kidney disease (HCC) 08/15/2017  . Hypertension associated with diabetes (HCC)   . Arteriosclerosis of coronary artery 08/05/2014  . Acid reflux 08/05/2014  . Hx of CABG 09/06/2011  . Chronic systolic heart failure (HCC) 05/02/2011  . LAD stenosis   . Coronary artery disease   . Automatic implantable cardioverter-defibrillator in situ 01/12/2010  . Other specified forms of chronic ischemic heart disease 11/03/2009  . Cardiomyopathy, ischemic  11/03/2009  . Hyperlipidemia associated with type 2 diabetes mellitus (HCC) 02/02/2009   Past Medical History:  Diagnosis Date  . CHF (congestive heart failure) (HCC)    a. EF 25-30% in 2016 b. 35-40% by echo in 10/2017  . COPD (chronic obstructive pulmonary disease) (HCC)   . Coronary artery disease   . GI bleed    Mallory-Weiss tear EGD 02/2010  . Hyperlipidemia   . Hypertension   . Ileus (HCC) 2006   required hospitalization  . Left ventricular dysfunction    apical thrombus  . MRSA bacteremia   . 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 Jude  . Small bowel obstruction (HCC) 2007   resolved without surgery    Family History  Problem Relation Age of Onset  . Heart attack Mother   . Emphysema Father   . Coronary artery disease Father        s/p cabg  . Colon cancer Neg Hx     Past Surgical History:  Procedure Laterality Date  . basilic vein left arm resection    . CARDIAC DEFIBRILLATOR PLACEMENT    . CORONARY ARTERY BYPASS GRAFT     6 vessels, 2000  . INSERT / REPLACE / REMOVE PACEMAKER  01/05/10   ICD insertion/St. Jude single chamber   Social History   Occupational History  . 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 Smoking status: Former Smoker    Types: Cigarettes    Start date: 11/28/1967    Last attempt to quit: 02/29/1988    Years since quitting: 30.4  . Smokeless tobacco: Never Used  Substance and Sexual Activity  . Alcohol use: No    Alcohol/week: 0.0 standard drinks  . Drug use: No  . Sexual activity: Never

## 2018-07-25 LAB — CUP PACEART REMOTE DEVICE CHECK
Battery Remaining Percentage: 34 %
Brady Statistic RV Percent Paced: 1 %
Date Time Interrogation Session: 20190805061842
HighPow Impedance: 44 Ohm
Implantable Lead Location: 753860
Implantable Lead Model: 7120
Lead Channel Pacing Threshold Pulse Width: 0.4 ms
Lead Channel Sensing Intrinsic Amplitude: 12 mV
Lead Channel Setting Pacing Amplitude: 2.5 V
Lead Channel Setting Pacing Pulse Width: 0.4 ms
MDC IDC LEAD IMPLANT DT: 20110209
MDC IDC MSMT BATTERY REMAINING LONGEVITY: 41 mo
MDC IDC MSMT BATTERY VOLTAGE: 2.89 V
MDC IDC MSMT LEADCHNL RV IMPEDANCE VALUE: 410 Ohm
MDC IDC MSMT LEADCHNL RV PACING THRESHOLD AMPLITUDE: 0.75 V
MDC IDC PG IMPLANT DT: 20110209
MDC IDC SET LEADCHNL RV SENSING SENSITIVITY: 0.5 mV
Pulse Gen Serial Number: 747960

## 2018-08-06 ENCOUNTER — Other Ambulatory Visit: Payer: Self-pay | Admitting: Cardiology

## 2018-08-20 ENCOUNTER — Ambulatory Visit (INDEPENDENT_AMBULATORY_CARE_PROVIDER_SITE_OTHER): Payer: Medicare HMO | Admitting: Family

## 2018-08-20 ENCOUNTER — Encounter: Payer: Self-pay | Admitting: Family

## 2018-08-20 VITALS — BP 146/86 | HR 68 | Temp 97.8°F | Ht 71.0 in | Wt 193.4 lb

## 2018-08-20 DIAGNOSIS — B9689 Other specified bacterial agents as the cause of diseases classified elsewhere: Secondary | ICD-10-CM | POA: Diagnosis not present

## 2018-08-20 DIAGNOSIS — J208 Acute bronchitis due to other specified organisms: Secondary | ICD-10-CM | POA: Diagnosis not present

## 2018-08-20 MED ORDER — AZITHROMYCIN 250 MG PO TABS: ORAL_TABLET | ORAL | 0 refills | Status: DC

## 2018-08-20 MED ORDER — PREDNISONE 10 MG (21) PO TBPK: ORAL_TABLET | ORAL | 0 refills | Status: DC

## 2018-08-20 NOTE — Progress Notes (Signed)
   Subjective:    Patient ID: Joseph [name], male    DOB: 06-Sep-1956, 62 y.o.   MRN: [id number]  Chief Complaint  Patient presents with  . Cough    Cough  This is a new problem. The current episode started in the past 7 days. The problem has been waxing and waning. The problem occurs every few minutes. The cough is productive of sputum. Associated symptoms include chills, myalgias, a sore throat and shortness of breath. Pertinent negatives include no ear congestion, ear pain, fever, headaches, nasal congestion or postnasal drip. The symptoms are aggravated by lying down. He has tried rest and OTC cough suppressant for the symptoms. The treatment provided mild relief.      Review of Systems  Constitutional: Positive for chills. Negative for fever.  HENT: Positive for sore throat. Negative for ear pain and postnasal drip.   Respiratory: Positive for cough and shortness of breath.   Musculoskeletal: Positive for myalgias.  Neurological: Negative for headaches.  All other systems reviewed and are negative.      Objective:   Physical Exam  Constitutional: He is oriented to person, place, and time. He appears well-developed and well-nourished. No distress.  HENT:  Head: Normocephalic.  Right Ear: External ear normal.  Left Ear: External ear normal.  Mouth/Throat: Posterior oropharyngeal erythema present.  Eyes: Pupils are equal, round, and reactive to light. Right eye exhibits no discharge. Left eye exhibits no discharge.  Neck: Normal range of motion. Neck supple. No thyromegaly present.  Cardiovascular: Normal rate, regular rhythm, normal heart sounds and intact distal pulses.  No murmur heard. Pulmonary/Chest: Effort normal. No respiratory distress. He has wheezes.  Abdominal: Soft. Bowel sounds are normal. He exhibits no distension. There is no tenderness.  Musculoskeletal: Normal range of motion. He exhibits no edema or tenderness.  Neurological: He is alert and oriented to  person, place, and time. He has normal reflexes. No cranial nerve deficit.  Skin: Skin is warm and dry. No rash noted. No erythema.  Psychiatric: He has a normal mood and affect. His behavior is normal. Judgment and thought content normal.  Vitals reviewed.     BP (!) 146/86   Pulse 68   Temp 97.8 F (36.6 C) (Oral)   Ht 5\' 11"  (1.803 m)   Wt 193 lb 6.4 oz (87.7 kg)   BMI 26.97 kg/m      Assessment & Plan:  [name] comes in today with chief complaint of Cough   Diagnosis and orders addressed:  1. Acute bacterial bronchitis - Take meds as prescribed - Use a cool mist humidifier  -Use saline nose sprays frequently -Force fluids -For any cough or congestion  Use plain Mucinex- regular strength or max strength is fine -For fever or aces or pains- take tylenol or ibuprofen. -Throat lozenges if help -New toothbrush in 3 days RTO if symptoms worsen or do not improve - predniSONE (STERAPRED UNI-PAK 21 TAB) 10 MG (21) TBPK tablet; Use as directed  Dispense: 21 tablet; Refill: 0 - azithromycin (ZITHROMAX) 250 MG tablet; Take 500 mg once, then 250 mg for four days  Dispense: 6 tablet; Refill: 0   [name], FNP

## 2018-08-20 NOTE — Patient Instructions (Signed)

## 2018-09-10 ENCOUNTER — Telehealth: Payer: Self-pay | Admitting: Orthopedic Surgery

## 2018-09-10 NOTE — Telephone Encounter (Signed)
Patient called to inquire about forms which he needs to have filled out "the attending physician" portion. Discussed our forms process (authorization forms and fee, completing through [facility]). To bring forms in next Wednesday, 09/18/18 - review all at that time.

## 2018-09-30 ENCOUNTER — Ambulatory Visit (INDEPENDENT_AMBULATORY_CARE_PROVIDER_SITE_OTHER): Payer: Medicare HMO | Admitting: *Deleted

## 2018-09-30 ENCOUNTER — Other Ambulatory Visit: Payer: Self-pay | Admitting: Adult Health

## 2018-09-30 DIAGNOSIS — I255 Ischemic cardiomyopathy: Secondary | ICD-10-CM | POA: Diagnosis not present

## 2018-09-30 NOTE — Progress Notes (Signed)
Remote ICD transmission.   

## 2018-10-03 ENCOUNTER — Encounter: Payer: Self-pay | Admitting: Cardiology

## 2018-10-14 ENCOUNTER — Ambulatory Visit (INDEPENDENT_AMBULATORY_CARE_PROVIDER_SITE_OTHER): Payer: Medicare HMO

## 2018-10-14 DIAGNOSIS — Z23 Encounter for immunization: Secondary | ICD-10-CM | POA: Diagnosis not present

## 2018-11-11 NOTE — Progress Notes (Signed)
HPI The patient returns for followup of coronary disease and cardiomyopathy.  Since I last saw him he is done well. The patient denies any new symptoms such as chest discomfort, neck or arm discomfort. There has been no new shortness of breath, PND or orthopnea. There have been no reported palpitations, presyncope or syncope.  He still does some part-time with the fire squad.  He does some walking for exercise.     No Known Allergies  Current Outpatient Medications  Medication Sig Dispense Refill  . acetaminophen (TYLENOL) 500 MG tablet Take 500 mg by mouth every 6 (six) hours as needed for pain.    Marland Kitchen albuterol (PROVENTIL HFA;VENTOLIN HFA) 108 (90 Base) MCG/ACT inhaler Inhale 2 puffs into the lungs every 4 (four) hours as needed. 6.7 g 0  . aspirin 81 MG tablet Take 81 mg by mouth at bedtime.     Marland Kitchen atorvastatin (LIPITOR) 80 MG tablet TAKE 1 TABLET BY MOUTH EVERY DAY IN THE EVENING 90 tablet 1  . enalapril (VASOTEC) 20 MG tablet Take 1 tablet (20 mg total) by mouth 2 (two) times daily. 180 tablet 3  . fish oil-omega-3 fatty acids 1000 MG capsule Take 1 g by mouth 3 (three) times daily.     Marland Kitchen guaiFENesin (MUCINEX PO) Take by mouth as needed.     . metoprolol tartrate (LOPRESSOR) 50 MG tablet Take 1 tablet (50 mg total) by mouth 3 (three) times daily. 270 tablet 3  . Multiple Vitamin (MULTIVITAMIN) tablet Take 1 tablet by mouth daily. With FeSo4    . nitroGLYCERIN (NITROSTAT) 0.4 MG SL tablet Place 1 tablet (0.4 mg total) under the tongue every 5 (five) minutes as needed for chest pain. 25 tablet 3  . omeprazole (PRILOSEC) 20 MG capsule TAKE 1 CAPSULE BY MOUTH EVERY DAY 90 capsule 2  . potassium chloride SA (KLOR-CON M20) 20 MEQ tablet Take 1 tablet (20 mEq total) by mouth daily. 90 tablet 3  . torsemide (DEMADEX) 20 MG tablet TAKE 2 TABLETS BY MOUTH EVERY DAY 180 tablet 0   No current facility-administered medications for this visit.   Is  Past Medical History:  Diagnosis Date  . CHF  (congestive heart failure) (HCC)    a. EF 25-30% in 2016 b. 35-40% by echo in 10/2017  . COPD (chronic obstructive pulmonary disease) (HCC)   . Coronary artery disease   . GI bleed    Mallory-Weiss tear EGD 02/2010  . Hyperlipidemia   . Hypertension   . Ileus (HCC) 2006   required hospitalization  . Left ventricular dysfunction    apical thrombus  . MRSA bacteremia   . 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 Jude  . Small bowel obstruction (HCC) 2007   resolved without surgery    Past Surgical History:  Procedure Laterality Date  . basilic vein left arm resection    . CARDIAC DEFIBRILLATOR PLACEMENT    . CORONARY ARTERY BYPASS GRAFT     6 vessels, 2000  . INSERT / REPLACE / REMOVE PACEMAKER  01/05/10   ICD insertion/St. Jude single chamber    ROS   As stated in the HPI and negative for all other systems.   PHYSICAL EXAM BP 106/70   Pulse 70   Ht 5\' 11"  (1.803 m)   Wt 197 lb (89.4 kg)   BMI 27.48 kg/m   GENERAL:  Well appearing NECK:  No jugular venous distention, waveform within normal limits, carotid upstroke  brisk and symmetric, no bruits, no thyromegaly LUNGS:  Clear to auscultation bilaterally CHEST:  Well healed ICD scar, and sternotomy scar HEART:  PMI not displaced or sustained,S1 and S2 within normal limits, no S3, no S4, no clicks, no rubs, 2 out of 6 apical systolic murmur radiating slightly to the axilla and out the aortic outflow tract, no diastolic murmurs ABD:  Flat, positive bowel sounds normal in frequency in pitch, no bruits, no rebound, no guarding, no midline pulsatile mass, no hepatomegaly, no splenomegaly EXT:  2 plus pulses throughout, mild ankle edema, no cyanosis no clubbing   EKG:   Sinus rhythm, rate 70, axis within normal limits, intervals within normal limits, no acute ST-T wave changes.   ASSESSMENT AND PLAN  Coronary artery disease -   The patient has no new sypmtoms.  No further  cardiovascular testing is indicated.  We will continue with aggressive risk reduction and meds as listed.  CARDIOMYOPATHY -  EF was 35 - 40%.   I will repeat an echocardiogram in July as below.  For now he will maintain the meds as titrated.  He failed as previously documented Entresto.  ICD - He is up-to-date with follow-up.  I reviewed the most recent report in November.  DYSLIPIDEMIA - His LDL was 63 with an HDL of 36.  He will continue on the meds as listed.  DM - His A1c was 6.0 in Feb.  No change in therapy.  CKD - Creatinine most recently was 1.35 recently.

## 2018-11-13 ENCOUNTER — Encounter: Payer: Self-pay | Admitting: Cardiology

## 2018-11-13 ENCOUNTER — Ambulatory Visit (INDEPENDENT_AMBULATORY_CARE_PROVIDER_SITE_OTHER): Payer: Medicare HMO | Admitting: Cardiology

## 2018-11-13 VITALS — BP 106/70 | HR 70 | Ht 71.0 in | Wt 197.0 lb

## 2018-11-13 DIAGNOSIS — E785 Hyperlipidemia, unspecified: Secondary | ICD-10-CM

## 2018-11-13 DIAGNOSIS — I10 Essential (primary) hypertension: Secondary | ICD-10-CM

## 2018-11-13 DIAGNOSIS — I255 Ischemic cardiomyopathy: Secondary | ICD-10-CM | POA: Diagnosis not present

## 2018-11-13 DIAGNOSIS — I2510 Atherosclerotic heart disease of native coronary artery without angina pectoris: Secondary | ICD-10-CM

## 2018-11-13 NOTE — Patient Instructions (Signed)
Medication Instructions:  The current medical regimen is effective;  continue present plan and medications.  If you need a refill on your cardiac medications before your next appointment, please call your pharmacy.   Testing/Procedures: Your physician has requested that you have an echocardiogram in 6 months prior to seeing him in July 2020. Echocardiography is a painless test that uses sound waves to create images of your heart. It provides your doctor with information about the size and shape of your heart and how well your heart's chambers and valves are working. This procedure takes approximately one hour. There are no restrictions for this procedure.  Follow-Up: Follow up in 6 months with Dr. Antoine Poche in [place].  You will receive a letter in the mail 2 months before you are due.  Please call us when you receive this letter to schedule your follow up appointment.  Thank you for choosing [facility]!!

## 2018-11-29 LAB — CUP PACEART REMOTE DEVICE CHECK
Battery Remaining Longevity: 38 mo
Battery Remaining Percentage: 32 %
Battery Voltage: 2.89 V
Brady Statistic RV Percent Paced: 1 %
Date Time Interrogation Session: 20191104064616
HighPow Impedance: 44 Ohm
Implantable Lead Implant Date: 20110209
Implantable Lead Location: 753860
Implantable Lead Model: 7120
Implantable Pulse Generator Implant Date: 20110209
Lead Channel Impedance Value: 450 Ohm
Lead Channel Pacing Threshold Amplitude: 0.75 V
Lead Channel Sensing Intrinsic Amplitude: 11.9 mV
Lead Channel Setting Pacing Amplitude: 2.5 V
Lead Channel Setting Pacing Pulse Width: 0.4 ms
Lead Channel Setting Sensing Sensitivity: 0.5 mV
MDC IDC MSMT LEADCHNL RV PACING THRESHOLD PULSEWIDTH: 0.4 ms
MDC IDC PG SERIAL: 747960

## 2018-12-09 ENCOUNTER — Other Ambulatory Visit: Payer: Self-pay | Admitting: Cardiology

## 2018-12-11 ENCOUNTER — Ambulatory Visit (INDEPENDENT_AMBULATORY_CARE_PROVIDER_SITE_OTHER): Payer: Medicare HMO | Admitting: Family Medicine

## 2018-12-11 ENCOUNTER — Encounter: Payer: Self-pay | Admitting: Family Medicine

## 2018-12-11 VITALS — BP 119/70 | HR 72 | Temp 97.3°F | Ht 71.0 in | Wt 195.6 lb

## 2018-12-11 DIAGNOSIS — H6123 Impacted cerumen, bilateral: Secondary | ICD-10-CM

## 2018-12-11 NOTE — Progress Notes (Signed)
BP 119/70   Pulse 72   Temp (!) 97.3 F (36.3 C) (Oral)   Ht 5\' 11"  (1.803 m)   Wt 195 lb 9.6 oz (88.7 kg)   BMI 27.28 kg/m    Subjective:    Patient ID: Joseph [name], male    DOB: 1956-05-13, 63 y.o.   MRN: [id number]  HPI: Joseph [name] is a 63 y.o. male presenting on 12/11/2018 for Ear Pain (left x 1 day)   HPI Left ear congestion Patient is coming in with complaints of left ear congestion is been bothering him since yesterday.  He says had issues with wax previously but it all of a sudden just started getting muffled yesterday.  He denies any pain or congestion or sinus pressure but just that the ear is muffled and he cannot hear as well because of wax.  He did use some peroxide drops yesterday and it helped a little bit but then it came right back this morning.  He denies any fevers or chills or drainage.  Relevant past medical, surgical, family and social history reviewed and updated as indicated. Interim medical history since our last visit reviewed. Allergies and medications reviewed and updated.  Review of Systems  Constitutional: Negative for chills and fever.  HENT: Positive for hearing loss. Negative for congestion, ear discharge and ear pain.   Eyes: Negative for visual disturbance.  Respiratory: Negative for shortness of breath and wheezing.   Cardiovascular: Negative for chest pain and leg swelling.  Musculoskeletal: Negative for arthralgias, back pain and gait problem.  Skin: Negative for color change and rash.  All other systems reviewed and are negative.   Per HPI unless specifically indicated above   Allergies as of 12/11/2018   No Known Allergies     Medication List       Accurate as of December 11, 2018 10:26 AM. Always use your most recent med list.        acetaminophen 500 MG tablet Commonly known as:  TYLENOL Take 500 mg by mouth every 6 (six) hours as needed for pain.   albuterol 108 (90 Base) MCG/ACT inhaler Commonly known as:  PROVENTIL  HFA;VENTOLIN HFA Inhale 2 puffs into the lungs every 4 (four) hours as needed.   aspirin 81 MG tablet Take 81 mg by mouth at bedtime.   atorvastatin 80 MG tablet Commonly known as:  LIPITOR TAKE 1 TABLET BY MOUTH EVERY DAY IN THE EVENING   enalapril 20 MG tablet Commonly known as:  VASOTEC Take 1 tablet (20 mg total) by mouth 2 (two) times daily.   fish oil-omega-3 fatty acids 1000 MG capsule Take 1 g by mouth 3 (three) times daily.   metoprolol tartrate 50 MG tablet Commonly known as:  LOPRESSOR Take 1 tablet (50 mg total) by mouth 3 (three) times daily.   MUCINEX PO Take by mouth as needed.   multivitamin tablet Take 1 tablet by mouth daily. With FeSo4   nitroGLYCERIN 0.4 MG SL tablet Commonly known as:  NITROSTAT Place 1 tablet (0.4 mg total) under the tongue every 5 (five) minutes as needed for chest pain.   omeprazole 20 MG capsule Commonly known as:  PRILOSEC TAKE 1 CAPSULE BY MOUTH EVERY DAY   potassium chloride SA 20 MEQ tablet Commonly known as:  KLOR-CON M20 Take 1 tablet (20 mEq total) by mouth daily.   torsemide 20 MG tablet Commonly known as:  DEMADEX TAKE 2 TABLETS BY MOUTH EVERY DAY  Objective:    BP 119/70   Pulse 72   Temp (!) 97.3 F (36.3 C) (Oral)   Ht 5\' 11"  (1.803 m)   Wt 195 lb 9.6 oz (88.7 kg)   BMI 27.28 kg/m   Wt Readings from Last 3 Encounters:  12/11/18 195 lb 9.6 oz (88.7 kg)  11/13/18 197 lb (89.4 kg)  08/20/18 193 lb 6.4 oz (87.7 kg)    Physical Exam Vitals signs and nursing note reviewed.  Constitutional:      General: He is not in acute distress.    Appearance: He is well-developed. He is not diaphoretic.  HENT:     Right Ear: There is impacted cerumen.     Left Ear: There is impacted cerumen.  Eyes:     General: No scleral icterus.    Conjunctiva/sclera: Conjunctivae normal.  Neck:     Thyroid: No thyromegaly.  Musculoskeletal: Normal range of motion.  Skin:    General: Skin is warm and dry.      Findings: No rash.  Neurological:     Mental Status: He is alert and oriented to person, place, and time.     Coordination: Coordination normal.  Psychiatric:        Behavior: Behavior normal.     Nurse to do cerumen lavage, PA student Jennette Bill helped with lavage as well.  Patient tolerated well    Assessment & Plan:   Problem List Items Addressed This Visit    None    Visit Diagnoses    Bilateral impacted cerumen    -  Primary       Follow up plan: Return if symptoms worsen or fail to improve.  Counseling provided for all of the vaccine components No orders of the defined types were placed in this encounter.   Arville Care, MD [facility] 12/11/2018, 10:26 AM

## 2018-12-28 ENCOUNTER — Other Ambulatory Visit: Payer: Self-pay | Admitting: Adult Health

## 2018-12-30 ENCOUNTER — Ambulatory Visit (INDEPENDENT_AMBULATORY_CARE_PROVIDER_SITE_OTHER): Payer: Medicare HMO

## 2018-12-30 DIAGNOSIS — I255 Ischemic cardiomyopathy: Secondary | ICD-10-CM | POA: Diagnosis not present

## 2018-12-30 NOTE — Telephone Encounter (Signed)
Rx request sent to pharmacy.  

## 2019-01-01 LAB — CUP PACEART REMOTE DEVICE CHECK
Battery Remaining Longevity: 38 mo
Battery Remaining Percentage: 32 %
Battery Voltage: 2.87 V
Brady Statistic RV Percent Paced: 1 %
Date Time Interrogation Session: 20200203090025
HighPow Impedance: 44 Ohm
Implantable Lead Implant Date: 20110209
Implantable Lead Location: 753860
Implantable Lead Model: 7120
Implantable Pulse Generator Implant Date: 20110209
Lead Channel Pacing Threshold Amplitude: 0.75 V
Lead Channel Sensing Intrinsic Amplitude: 12 mV
Lead Channel Setting Pacing Amplitude: 2.5 V
Lead Channel Setting Pacing Pulse Width: 0.4 ms
MDC IDC MSMT LEADCHNL RV IMPEDANCE VALUE: 410 Ohm
MDC IDC MSMT LEADCHNL RV PACING THRESHOLD PULSEWIDTH: 0.4 ms
MDC IDC SET LEADCHNL RV SENSING SENSITIVITY: 0.5 mV
Pulse Gen Serial Number: 747960

## 2019-01-03 ENCOUNTER — Other Ambulatory Visit: Payer: Medicare HMO

## 2019-01-03 DIAGNOSIS — I10 Essential (primary) hypertension: Secondary | ICD-10-CM

## 2019-01-03 DIAGNOSIS — E785 Hyperlipidemia, unspecified: Principal | ICD-10-CM

## 2019-01-03 DIAGNOSIS — E1159 Type 2 diabetes mellitus with other circulatory complications: Secondary | ICD-10-CM | POA: Diagnosis not present

## 2019-01-03 DIAGNOSIS — N183 Type 2 diabetes mellitus with diabetic chronic kidney disease: Secondary | ICD-10-CM

## 2019-01-03 DIAGNOSIS — I152 Hypertension secondary to endocrine disorders: Secondary | ICD-10-CM

## 2019-01-03 DIAGNOSIS — Z125 Encounter for screening for malignant neoplasm of prostate: Secondary | ICD-10-CM | POA: Diagnosis not present

## 2019-01-03 DIAGNOSIS — E1169 Type 2 diabetes mellitus with other specified complication: Secondary | ICD-10-CM

## 2019-01-03 DIAGNOSIS — E1122 Type 2 diabetes mellitus with diabetic chronic kidney disease: Secondary | ICD-10-CM

## 2019-01-03 LAB — BAYER DCA HB A1C WAIVED: HB A1C (BAYER DCA - WAIVED): 6.4 % (ref <7.0)

## 2019-01-04 LAB — LIPID PANEL
CHOLESTEROL TOTAL: 115 mg/dL (ref 100–199)
Chol/HDL Ratio: 2.7 ratio (ref 0.0–5.0)
HDL: 43 mg/dL (ref >39)
LDL CALC: 58 mg/dL (ref 0–99)
Triglycerides: 68 mg/dL (ref 0–149)
VLDL Cholesterol Cal: 14 mg/dL (ref 5–40)

## 2019-01-04 LAB — CBC WITH DIFFERENTIAL/PLATELET
Basophils Absolute: 0 10*3/uL (ref 0.0–0.2)
Basos: 0 %
EOS (ABSOLUTE): 0.2 10*3/uL (ref 0.0–0.4)
Eos: 2 %
Hematocrit: 31.2 % — ABNORMAL LOW (ref 37.5–51.0)
Hemoglobin: 9.2 g/dL — ABNORMAL LOW (ref 13.0–17.7)
Immature Grans (Abs): 0 10*3/uL (ref 0.0–0.1)
Immature Granulocytes: 0 %
Lymphocytes Absolute: 1.4 10*3/uL (ref 0.7–3.1)
Lymphs: 15 %
MCH: 22 pg — AB (ref 26.6–33.0)
MCHC: 29.5 g/dL — ABNORMAL LOW (ref 31.5–35.7)
MCV: 75 fL — ABNORMAL LOW (ref 79–97)
Monocytes Absolute: 0.7 10*3/uL (ref 0.1–0.9)
Monocytes: 7 %
Neutrophils Absolute: 7.4 10*3/uL — ABNORMAL HIGH (ref 1.4–7.0)
Neutrophils: 76 %
Platelets: 329 10*3/uL (ref 150–450)
RBC: 4.19 x10E6/uL (ref 4.14–5.80)
RDW: 15 % (ref 11.6–15.4)
WBC: 9.8 10*3/uL (ref 3.4–10.8)

## 2019-01-04 LAB — CMP14+EGFR
ALT: 17 IU/L (ref 0–44)
AST: 15 IU/L (ref 0–40)
Albumin/Globulin Ratio: 1.5 (ref 1.2–2.2)
Albumin: 3.8 g/dL (ref 3.8–4.8)
Alkaline Phosphatase: 117 IU/L (ref 39–117)
BILIRUBIN TOTAL: 0.2 mg/dL (ref 0.0–1.2)
BUN/Creatinine Ratio: 13 (ref 10–24)
BUN: 18 mg/dL (ref 8–27)
CHLORIDE: 101 mmol/L (ref 96–106)
CO2: 19 mmol/L — ABNORMAL LOW (ref 20–29)
Calcium: 9.1 mg/dL (ref 8.6–10.2)
Creatinine, Ser: 1.43 mg/dL — ABNORMAL HIGH (ref 0.76–1.27)
GFR calc Af Amer: 59 mL/min/{1.73_m2} — ABNORMAL LOW (ref >59)
GFR calc non Af Amer: 51 mL/min/{1.73_m2} — ABNORMAL LOW (ref >59)
GLUCOSE: 112 mg/dL — AB (ref 65–99)
Globulin, Total: 2.5 g/dL (ref 1.5–4.5)
Potassium: 5 mmol/L (ref 3.5–5.2)
Sodium: 138 mmol/L (ref 134–144)
Total Protein: 6.3 g/dL (ref 6.0–8.5)

## 2019-01-04 LAB — PSA, TOTAL AND FREE
PROSTATE SPECIFIC AG, SERUM: 1 ng/mL (ref 0.0–4.0)
PSA, Free Pct: 26 %
PSA, Free: 0.26 ng/mL

## 2019-01-06 ENCOUNTER — Ambulatory Visit (INDEPENDENT_AMBULATORY_CARE_PROVIDER_SITE_OTHER): Payer: Medicare HMO | Admitting: Family Medicine

## 2019-01-06 ENCOUNTER — Encounter: Payer: Self-pay | Admitting: Family Medicine

## 2019-01-06 ENCOUNTER — Other Ambulatory Visit: Payer: Self-pay | Admitting: *Deleted

## 2019-01-06 VITALS — BP 136/77 | HR 69 | Temp 97.4°F | Ht 71.0 in | Wt 199.4 lb

## 2019-01-06 DIAGNOSIS — E1169 Type 2 diabetes mellitus with other specified complication: Secondary | ICD-10-CM

## 2019-01-06 DIAGNOSIS — N183 Chronic kidney disease, stage 3 unspecified: Secondary | ICD-10-CM

## 2019-01-06 DIAGNOSIS — D631 Anemia in chronic kidney disease: Secondary | ICD-10-CM | POA: Diagnosis not present

## 2019-01-06 DIAGNOSIS — E1122 Type 2 diabetes mellitus with diabetic chronic kidney disease: Secondary | ICD-10-CM

## 2019-01-06 DIAGNOSIS — E785 Hyperlipidemia, unspecified: Secondary | ICD-10-CM | POA: Diagnosis not present

## 2019-01-06 DIAGNOSIS — E1159 Type 2 diabetes mellitus with other circulatory complications: Secondary | ICD-10-CM | POA: Diagnosis not present

## 2019-01-06 DIAGNOSIS — I152 Hypertension secondary to endocrine disorders: Secondary | ICD-10-CM

## 2019-01-06 DIAGNOSIS — Z1211 Encounter for screening for malignant neoplasm of colon: Secondary | ICD-10-CM | POA: Diagnosis not present

## 2019-01-06 DIAGNOSIS — D649 Anemia, unspecified: Secondary | ICD-10-CM | POA: Insufficient documentation

## 2019-01-06 DIAGNOSIS — I10 Essential (primary) hypertension: Secondary | ICD-10-CM | POA: Diagnosis not present

## 2019-01-06 MED ORDER — ATORVASTATIN CALCIUM 80 MG PO TABS: ORAL_TABLET | ORAL | 3 refills | Status: DC

## 2019-01-06 MED ORDER — FERROUS SULFATE 325 (65 FE) MG PO TABS: 325.0000 mg | ORAL_TABLET | Freq: Every day | ORAL | 3 refills | Status: DC

## 2019-01-06 MED ORDER — TORSEMIDE 20 MG PO TABS: 40.0000 mg | ORAL_TABLET | Freq: Every day | ORAL | 3 refills | Status: DC

## 2019-01-06 NOTE — Progress Notes (Signed)
BP 136/77   Pulse 69   Temp (!) 97.4 F (36.3 C) (Oral)   Ht 5\' 11"  (1.803 m)   Wt 199 lb 6.4 oz (90.4 kg)   BMI 27.81 kg/m    Subjective:    Patient ID: Joseph [name], male    DOB: Dec 13, 1955, 63 y.o.   MRN: [id number]  HPI: Joseph [name] is a 63 y.o. male presenting on 01/06/2019 for Diabetes (6 month follow up); Hyperlipidemia; and Hypertension   HPI Type 2 diabetes mellitus Patient comes in today for recheck of his diabetes. Patient has been currently taking no medication currently and is diet controlled,. Patient is currently on an ACE inhibitor/ARB. Patient has not seen an ophthalmologist this year. Patient denies any issues with their feet.   Hypertension Patient is currently on enalapril and metoprolol, and their blood pressure today is 136/77. Patient denies any lightheadedness or dizziness. Patient denies headaches, blurred vision, chest pains, shortness of breath, or weakness. Denies any side effects from medication and is content with current medication.   Hyperlipidemia Patient is coming in for recheck of his hyperlipidemia. The patient is currently taking Lipitor. They deny any issues with myalgias or history of liver damage from it. They deny any focal numbness or weakness or chest pain.   Anemia Patient has been having anemia, he denies any blood loss currently but his blood counts have been down recently, we will recheck it today.  Patient denies any lightheadedness or dizziness today.  Relevant past medical, surgical, family and social history reviewed and updated as indicated. Interim medical history since our last visit reviewed. Allergies and medications reviewed and updated.  Review of Systems  Constitutional: Negative for chills and fever.  Respiratory: Negative for shortness of breath and wheezing.   Cardiovascular: Negative for chest pain and leg swelling.  Musculoskeletal: Negative for back pain and gait problem.  Skin: Negative for rash.    Neurological: Negative for dizziness, weakness, light-headedness and numbness.  All other systems reviewed and are negative.   Per HPI unless specifically indicated above     Objective:    BP 136/77   Pulse 69   Temp (!) 97.4 F (36.3 C) (Oral)   Ht 5\' 11"  (1.803 m)   Wt 199 lb 6.4 oz (90.4 kg)   BMI 27.81 kg/m   Wt Readings from Last 3 Encounters:  01/06/19 199 lb 6.4 oz (90.4 kg)  12/11/18 195 lb 9.6 oz (88.7 kg)  11/13/18 197 lb (89.4 kg)    Physical Exam Vitals signs and nursing note reviewed.  Constitutional:      General: He is not in acute distress.    Appearance: He is well-developed. He is not diaphoretic.  Eyes:     General: No scleral icterus.    Conjunctiva/sclera: Conjunctivae normal.  Neck:     Musculoskeletal: Neck supple.     Thyroid: No thyromegaly.  Cardiovascular:     Rate and Rhythm: Normal rate and regular rhythm.     Heart sounds: Normal heart sounds. No murmur.  Pulmonary:     Effort: Pulmonary effort is normal. No respiratory distress.     Breath sounds: Normal breath sounds. No wheezing.  Musculoskeletal: Normal range of motion.  Lymphadenopathy:     Cervical: No cervical adenopathy.  Skin:    General: Skin is warm and dry.     Findings: No rash.  Neurological:     Mental Status: He is alert and oriented to person, place, and time.  Coordination: Coordination normal.  Psychiatric:        Behavior: Behavior normal.     Diabetic Foot Exam - Simple   Simple Foot Form Diabetic Foot exam was performed with the following findings:  Yes 01/06/2019  9:09 AM  Visual Inspection No deformities, no ulcerations, no other skin breakdown bilaterally:  Yes Sensation Testing Intact to touch and monofilament testing bilaterally:  Yes Pulse Check Posterior Tibialis and Dorsalis pulse intact bilaterally:  Yes Comments Patient does have onychomycosis and thickening and yellowing in all of his toenails but otherwise intact         Assessment  & Plan:   Problem List Items Addressed This Visit      Cardiovascular and Mediastinum   Hypertension associated with diabetes (HCC)   Relevant Medications   atorvastatin (LIPITOR) 80 MG tablet   torsemide (DEMADEX) 20 MG tablet     Endocrine   Hyperlipidemia associated with type 2 diabetes mellitus (HCC)   Relevant Medications   atorvastatin (LIPITOR) 80 MG tablet   Controlled diabetes mellitus with stage 3 chronic kidney disease (HCC) - Primary   Relevant Medications   atorvastatin (LIPITOR) 80 MG tablet   Other Relevant Orders   Ambulatory referral to Nephrology     Other   Anemia   Relevant Medications   ferrous sulfate (FERROUSUL) 325 (65 FE) MG tablet    Other Visit Diagnoses    Chronic kidney disease (CKD), stage III (moderate) (HCC)       Relevant Orders   Ambulatory referral to Nephrology   Colon cancer screening       Relevant Orders   Cologuard      Because of anemia gave patient 2 stool cards to do on 2 separate occasions is not losing blood.  Patient's kidney function has been elevated still and will do referral to nephrology Follow up plan: Return in about 3 months (around 04/06/2019) for Diabetes and hypertension recheck.  Counseling provided for all of the vaccine components Orders Placed This Encounter  Procedures  . Cologuard  . Ambulatory referral to Nephrology    Arville Care, MD [facility] 01/06/2019, 9:13 AM

## 2019-01-07 NOTE — Progress Notes (Signed)
Remote ICD transmission.   

## 2019-01-13 ENCOUNTER — Other Ambulatory Visit: Payer: Self-pay | Admitting: Nephrology

## 2019-01-13 ENCOUNTER — Other Ambulatory Visit (HOSPITAL_COMMUNITY): Payer: Self-pay | Admitting: Nephrology

## 2019-01-13 DIAGNOSIS — N183 Chronic kidney disease, stage 3 unspecified: Secondary | ICD-10-CM

## 2019-01-13 DIAGNOSIS — D649 Anemia, unspecified: Secondary | ICD-10-CM | POA: Diagnosis not present

## 2019-01-13 DIAGNOSIS — R809 Proteinuria, unspecified: Secondary | ICD-10-CM | POA: Diagnosis not present

## 2019-01-13 DIAGNOSIS — E8889 Other specified metabolic disorders: Secondary | ICD-10-CM | POA: Diagnosis not present

## 2019-01-28 ENCOUNTER — Ambulatory Visit (HOSPITAL_COMMUNITY)
Admission: RE | Admit: 2019-01-28 | Discharge: 2019-01-28 | Disposition: A | Discharge status: Home / Self Care | Payer: Medicare HMO | Source: Ambulatory Visit | Attending: Nephrology | Admitting: Nephrology

## 2019-01-28 ENCOUNTER — Other Ambulatory Visit: Payer: Self-pay

## 2019-01-28 ENCOUNTER — Ambulatory Visit (HOSPITAL_BASED_OUTPATIENT_CLINIC_OR_DEPARTMENT_OTHER)
Admission: RE | Admit: 2019-01-28 | Discharge: 2019-01-28 | Disposition: A | Discharge status: Home / Self Care | Payer: Medicare HMO | Source: Ambulatory Visit | Attending: Cardiology | Admitting: Cardiology

## 2019-01-28 DIAGNOSIS — N183 Chronic kidney disease, stage 3 unspecified: Secondary | ICD-10-CM

## 2019-01-28 DIAGNOSIS — I255 Ischemic cardiomyopathy: Secondary | ICD-10-CM | POA: Diagnosis not present

## 2019-01-28 DIAGNOSIS — I10 Essential (primary) hypertension: Secondary | ICD-10-CM

## 2019-01-28 NOTE — Progress Notes (Signed)
*  PRELIMINARY RESULTS* Echocardiogram 2D Echocardiogram has been performed.  Stacey Drain 01/28/2019, 3:08 PM

## 2019-02-04 DIAGNOSIS — D509 Iron deficiency anemia, unspecified: Secondary | ICD-10-CM | POA: Diagnosis not present

## 2019-02-04 DIAGNOSIS — R809 Proteinuria, unspecified: Secondary | ICD-10-CM | POA: Diagnosis not present

## 2019-02-04 DIAGNOSIS — E559 Vitamin D deficiency, unspecified: Secondary | ICD-10-CM | POA: Diagnosis not present

## 2019-02-04 DIAGNOSIS — D519 Vitamin B12 deficiency anemia, unspecified: Secondary | ICD-10-CM | POA: Diagnosis not present

## 2019-02-04 DIAGNOSIS — N183 Chronic kidney disease, stage 3 (moderate): Secondary | ICD-10-CM | POA: Diagnosis not present

## 2019-02-04 DIAGNOSIS — Z1159 Encounter for screening for other viral diseases: Secondary | ICD-10-CM | POA: Diagnosis not present

## 2019-02-04 DIAGNOSIS — I10 Essential (primary) hypertension: Secondary | ICD-10-CM | POA: Diagnosis not present

## 2019-02-04 DIAGNOSIS — Z79899 Other long term (current) drug therapy: Secondary | ICD-10-CM | POA: Diagnosis not present

## 2019-02-25 ENCOUNTER — Telehealth: Payer: Self-pay | Admitting: *Deleted

## 2019-02-25 ENCOUNTER — Ambulatory Visit (INDEPENDENT_AMBULATORY_CARE_PROVIDER_SITE_OTHER): Payer: Self-pay | Admitting: Internal Medicine

## 2019-02-25 ENCOUNTER — Other Ambulatory Visit: Payer: Self-pay

## 2019-02-25 DIAGNOSIS — D649 Anemia, unspecified: Secondary | ICD-10-CM

## 2019-02-25 NOTE — Telephone Encounter (Signed)
Called patient to do a telephone visit with RMR. He interrupted me stating he had to go he had a "cpr call" come in and had to rush off the phone. He stated he will have to do this later. I have made RMR and Stacey aware.

## 2019-02-25 NOTE — Progress Notes (Signed)
Patient canceled the appointment

## 2019-03-03 ENCOUNTER — Other Ambulatory Visit: Payer: Self-pay | Admitting: Family

## 2019-03-03 DIAGNOSIS — B9689 Other specified bacterial agents as the cause of diseases classified elsewhere: Secondary | ICD-10-CM

## 2019-03-03 DIAGNOSIS — J208 Acute bronchitis due to other specified organisms: Principal | ICD-10-CM

## 2019-03-25 DIAGNOSIS — N183 Chronic kidney disease, stage 3 (moderate): Secondary | ICD-10-CM | POA: Diagnosis not present

## 2019-03-25 DIAGNOSIS — I10 Essential (primary) hypertension: Secondary | ICD-10-CM | POA: Diagnosis not present

## 2019-03-25 DIAGNOSIS — D649 Anemia, unspecified: Secondary | ICD-10-CM | POA: Diagnosis not present

## 2019-03-25 DIAGNOSIS — I5030 Unspecified diastolic (congestive) heart failure: Secondary | ICD-10-CM | POA: Diagnosis not present

## 2019-03-25 DIAGNOSIS — D508 Other iron deficiency anemias: Secondary | ICD-10-CM | POA: Diagnosis not present

## 2019-03-25 DIAGNOSIS — M9080 Osteopathy in diseases classified elsewhere, unspecified site: Secondary | ICD-10-CM | POA: Diagnosis not present

## 2019-04-03 ENCOUNTER — Telehealth: Payer: Self-pay | Admitting: Cardiology

## 2019-04-03 NOTE — Telephone Encounter (Signed)
Virtual Visit Pre-Appointment Phone Call  "(Name), I am calling you today to discuss your upcoming appointment. We are currently trying to limit exposure to the virus that causes COVID-19 by seeing patients at home rather than in the office."  1. "What is the BEST phone number to call the day of the visit?" - include this in appointment notes  2. Do you have or have access to (through a family member/friend) a smartphone with video capability that we can use for your visit?" a. If yes - list this number in appt notes as cell (if different from BEST phone #) and list the appointment type as a VIDEO visit in appointment notes b. If no - list the appointment type as a PHONE visit in appointment notes  3. Confirm consent - "In the setting of the current Covid19 crisis, you are scheduled for a (phone or video) visit with your provider on (date) at (time).  Just as we do with many in-office visits, in order for you to participate in this visit, we must obtain consent.  If you'd like, I can send this to your mychart (if signed up) or email for you to review.  Otherwise, I can obtain your verbal consent now.  All virtual visits are billed to your insurance company just like a normal visit would be.  By agreeing to a virtual visit, we'd like you to understand that the technology does not allow for your provider to perform an examination, and thus may limit your provider's ability to fully assess your condition. If your provider identifies any concerns that need to be evaluated in person, we will make arrangements to do so.  Finally, though the technology is pretty good, we cannot assure that it will always work on either your or our end, and in the setting of a video visit, we may have to convert it to a phone-only visit.  In either situation, we cannot ensure that we have a secure connection.  Are you willing to proceed?" STAFF: Did the patient verbally acknowledge consent to telehealth visit? Document  YES/NO here: Yes  4. Advise patient to be prepared - "Two hours prior to your appointment, go ahead and check your blood pressure, pulse, oxygen saturation, and your weight (if you have the equipment to check those) and write them all down. When your visit starts, your provider will ask you for this information. If you have an Apple Watch or Kardia device, please plan to have heart rate information ready on the day of your appointment. Please have a pen and paper handy nearby the day of the visit as well."  5. Give patient instructions for MyChart download to smartphone OR [email or web address] as below if video visit (depending on what platform provider is using)  6. Inform patient they will receive a phone call 15 minutes prior to their appointment time (may be from unknown caller ID) so they should be prepared to answer    TELEPHONE CALL NOTE  JAYE FRAGOZA has been deemed a candidate for a follow-up tele-health visit to limit community exposure during the Covid-19 pandemic. I spoke with the patient via phone to ensure availability of phone/video source, confirm preferred email & phone number, and discuss instructions and expectations.  I reminded Jimarion Otterbein Yzaguirre to be prepared with any vital sign and/or heart rhythm information that could potentially be obtained via home monitoring, at the time of his visit. I reminded CLAYBOURNE BORING to expect a phone call prior to  his visit.  Terry L Stager 04/03/2019 1:05 PM

## 2019-04-07 ENCOUNTER — Other Ambulatory Visit: Payer: Self-pay

## 2019-04-07 ENCOUNTER — Ambulatory Visit (INDEPENDENT_AMBULATORY_CARE_PROVIDER_SITE_OTHER): Payer: Medicare HMO | Admitting: *Deleted

## 2019-04-07 ENCOUNTER — Encounter: Payer: Self-pay | Admitting: Family Medicine

## 2019-04-07 ENCOUNTER — Ambulatory Visit (INDEPENDENT_AMBULATORY_CARE_PROVIDER_SITE_OTHER): Payer: Medicare HMO | Admitting: Family Medicine

## 2019-04-07 DIAGNOSIS — I255 Ischemic cardiomyopathy: Secondary | ICD-10-CM

## 2019-04-07 DIAGNOSIS — I10 Essential (primary) hypertension: Secondary | ICD-10-CM

## 2019-04-07 DIAGNOSIS — N183 Type 2 diabetes mellitus with diabetic chronic kidney disease: Secondary | ICD-10-CM

## 2019-04-07 DIAGNOSIS — I5022 Chronic systolic (congestive) heart failure: Secondary | ICD-10-CM

## 2019-04-07 DIAGNOSIS — E1169 Type 2 diabetes mellitus with other specified complication: Secondary | ICD-10-CM | POA: Diagnosis not present

## 2019-04-07 DIAGNOSIS — E785 Hyperlipidemia, unspecified: Secondary | ICD-10-CM | POA: Diagnosis not present

## 2019-04-07 DIAGNOSIS — I152 Hypertension secondary to endocrine disorders: Secondary | ICD-10-CM

## 2019-04-07 DIAGNOSIS — E1159 Type 2 diabetes mellitus with other circulatory complications: Secondary | ICD-10-CM

## 2019-04-07 DIAGNOSIS — E1122 Type 2 diabetes mellitus with diabetic chronic kidney disease: Secondary | ICD-10-CM

## 2019-04-07 NOTE — Progress Notes (Signed)
Virtual Visit via Telephone Note   This visit type was conducted due to national recommendations for restrictions regarding the COVID-19 Pandemic (e.g. social distancing) in an effort to limit this patient's exposure and mitigate transmission in our community.  Due to his co-morbid illnesses, this patient is at least at moderate risk for complications without adequate follow up.  This format is felt to be most appropriate for this patient at this time.  The patient did not have access to video technology/had technical difficulties with video requiring transitioning to audio format only (telephone).  All issues noted in this document were discussed and addressed.  No physical exam could be performed with this format.  Please refer to the patient's chart for his  consent to telehealth for [facility].   Date:  04/08/2019   ID:  Joseph [name], Joseph [name], MRN [id number]  Patient Location: Home Provider Location: Home  PCP:  [name], Elige Radon, MD  Cardiologist:  Rollene Rotunda, MD  Electrophysiologist:  Lewayne Bunting, MD   Evaluation Performed:  Follow-Up Visit  Chief Complaint:  Cardiomyopathy  History of Present Illness:    Joseph [name] is a 63 y.o. male who follows up for his history of CAD (s/p CABG in 2000), chronic combined systolic and diastolic CHF (EF 66-06% in 2016, at 35-40% by echo in 10/2017), ischemic cardiomyopathy (s/p St. Jude ICD placement in 2011), HTN, HLD, Type 2 DM (diet-controlled) and Stage 3 CKD.  Since I last saw him he had an echo with a mild to moderately reduced EF.  This was unchanged from previous.  The patient denies any new symptoms such as chest discomfort, neck or arm discomfort. There has been no new shortness of breath, PND or orthopnea. There have been no reported palpitations, presyncope or syncope.  He is still going out and doing EMS rounds.  He says he is very careful with these.  The patient does not have symptoms concerning for COVID-19  infection (fever, chills, cough, or new shortness of breath).    Past Medical History:  Diagnosis Date  . CHF (congestive heart failure) (HCC)    a. EF 25-30% in 2016 b. 35-40% by echo in 10/2017  . COPD (chronic obstructive pulmonary disease) (HCC)   . Coronary artery disease   . GI bleed    Mallory-Weiss tear EGD 02/2010  . Hyperlipidemia   . Hypertension   . Ileus (HCC) 2006   required hospitalization  . Left ventricular dysfunction    apical thrombus  . MRSA bacteremia   . 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 Jude  . Small bowel obstruction (HCC) 2007   resolved without surgery   Past Surgical History:  Procedure Laterality Date  . basilic vein left arm resection    . CARDIAC DEFIBRILLATOR PLACEMENT    . CORONARY ARTERY BYPASS GRAFT     6 vessels, 2000  . INSERT / REPLACE / REMOVE PACEMAKER  01/05/10   ICD insertion/St. Jude single chamber     Current Meds  Medication Sig  . acetaminophen (TYLENOL) 500 MG tablet Take 500 mg by mouth every 6 (six) hours as needed for pain.  Marland Kitchen albuterol (PROVENTIL HFA;VENTOLIN HFA) 108 (90 Base) MCG/ACT inhaler Inhale 2 puffs into the lungs every 4 (four) hours as needed.  Marland Kitchen aspirin 81 MG tablet Take 81 mg by mouth at bedtime.   Marland Kitchen atorvastatin (LIPITOR) 80 MG tablet TAKE 1 TABLET BY MOUTH EVERY DAY IN THE EVENING  .  enalapril (VASOTEC) 20 MG tablet Take 1 tablet (20 mg total) by mouth 2 (two) times daily.  . ferrous sulfate (FERROUSUL) 325 (65 FE) MG tablet Take 1 tablet (325 mg total) by mouth daily with breakfast.  . fish oil-omega-3 fatty acids 1000 MG capsule Take 1 g by mouth 3 (three) times daily.   Marland Kitchen guaiFENesin (MUCINEX PO) Take 1 tablet by mouth as needed.   . metoprolol tartrate (LOPRESSOR) 50 MG tablet Take 1 tablet (50 mg total) by mouth 3 (three) times daily.  . Multiple Vitamin (MULTIVITAMIN) tablet Take 1 tablet by mouth daily. With FeSo4  . nitroGLYCERIN (NITROSTAT) 0.4 MG  SL tablet Place 1 tablet (0.4 mg total) under the tongue every 5 (five) minutes as needed for chest pain.  Marland Kitchen omeprazole (PRILOSEC) 20 MG capsule TAKE 1 CAPSULE BY MOUTH EVERY DAY  . potassium chloride SA (KLOR-CON M20) 20 MEQ tablet Take 1 tablet (20 mEq total) by mouth daily.  Marland Kitchen torsemide (DEMADEX) 20 MG tablet Take 2 tablets (40 mg total) by mouth daily. (Patient taking differently: Take 20 mg by mouth 2 (two) times daily. )     Allergies:   Patient has no known allergies.   Social History   Tobacco Use  . Smoking status: Former Smoker    Types: Cigarettes    Start date: 11/28/1967    Last attempt to quit: 02/29/1988    Years since quitting: 31.1  . Smokeless tobacco: Never Used  Substance Use Topics  . Alcohol use: No    Alcohol/week: 0.0 standard drinks  . Drug use: No     Family Hx: The patient's family history includes Coronary artery disease in his father; Emphysema in his father; Heart attack in his mother. There is no history of Colon cancer.  ROS:   Please see the history of present illness.    As stated in the HPI and negative for all other systems.   Prior CV studies:   The following studies were reviewed today:  Echo  Labs/Other Tests and Data Reviewed:    EKG:  No ECG reviewed.  Recent Labs: 01/03/2019: ALT 17; BUN 18; Creatinine, Ser 1.43; Hemoglobin 9.2; Platelets 329; Potassium 5.0; Sodium 138   Recent Lipid Panel Lab Results  Component Value Date/Time   CHOL 115 01/03/2019 09:44 AM   TRIG 68 01/03/2019 09:44 AM   HDL 43 01/03/2019 09:44 AM   CHOLHDL 2.7 01/03/2019 09:44 AM   CHOLHDL 3.2 03/08/2010 04:11 AM   LDLCALC 58 01/03/2019 09:44 AM    Wt Readings from Last 3 Encounters:  04/08/19 190 lb (86.2 kg)  01/06/19 199 lb 6.4 oz (90.4 kg)  12/11/18 195 lb 9.6 oz (88.7 kg)     Objective:    Vital Signs:  BP 130/72   Pulse 66   Ht 5\' 11"  (1.803 m)   Wt 190 lb (86.2 kg)   BMI 26.50 kg/m    VITAL SIGNS:  reviewed  ASSESSMENT & PLAN:     Coronary artery disease -  The patient has no new sypmtoms.  No further cardiovascular testing is indicated.  We will continue with aggressive risk reduction and meds as listed.  CARDIOMYOPATHY -  EF was 30%.  He is doing well.  He did not tolerate Entresto.  No change in therapy.  ICD - He looks like he might of had a follow-up that should have been yesterday and we are checking into this.  I did review the February follow-up and he had normal  function was up-to-date.  DYSLIPIDEMIA - LDL is as above and excellent.  No change in therapy.  DM - 6.4 A1c.  Continue meds as listed.   CKD - Creatinine has been stable and is followed by nephrology.  ANEMIA -  I note that he is anemic and is being treated with iron. Per [name], Elige RadonJoshua A, MD  COVID-19 Education: The signs and symptoms of COVID-19 were discussed with the patient and how to seek care for testing (follow up with PCP or arrange E-visit).  The importance of social distancing was discussed today.  Time:   Today, I have spent 16 minutes with the patient with telehealth technology discussing the above problems.     Medication Adjustments/Labs and Tests Ordered: Current medicines are reviewed at length with the patient today.  Concerns regarding medicines are outlined above.   Tests Ordered: No orders of the defined types were placed in this encounter.   Medication Changes: No orders of the defined types were placed in this encounter.   Disposition:  Follow up with me in four months  Signed, Rollene RotundaJames Zachory Mangual, MD  04/08/2019 11:33 AM    [facility]

## 2019-04-07 NOTE — Progress Notes (Signed)
Virtual Visit via telephone Note  I connected with Joseph [name] on 04/07/19 at 0934 by telephone and verified that I am speaking with the correct person using two identifiers. Joseph [name] is currently located at home and no other people are currently with her during visit. The provider, Elige Radon Labrina Lines, MD is located in their office at time of visit.  Call ended at [phone]  I discussed the limitations, risks, security and privacy concerns of performing an evaluation and management service by telephone and the availability of in person appointments. I also discussed with the patient that there may be a patient responsible charge related to this service. The patient expressed understanding and agreed to proceed.   History and Present Illness: Type 2 diabetes mellitus Patient comes in today for recheck of his diabetes. Patient has been currently taking diet controlled. Patient is not currently on an ACE inhibitor/ARB. Patient has not seen an ophthalmologist this year. Patient denies any issues with their feet.   Hypertension Patient is currently on enalapril and metoprolol, and their blood pressure today is 130/72. Patient denies any lightheadedness or dizziness. Patient denies headaches, blurred vision, chest pains, shortness of breath, or weakness. Denies any side effects from medication and is content with current medication.   Hyperlipidemia Patient is coming in for recheck of his hyperlipidemia. The patient is currently taking fish oil, lipitor. They deny any issues with myalgias or history of liver damage from it. They deny any focal numbness or weakness or chest pain.   No diagnosis found.  Outpatient Encounter Medications as of 04/07/2019  Medication Sig  . acetaminophen (TYLENOL) 500 MG tablet Take 500 mg by mouth every 6 (six) hours as needed for pain.  Marland Kitchen albuterol (PROVENTIL HFA;VENTOLIN HFA) 108 (90 Base) MCG/ACT inhaler Inhale 2 puffs into the lungs every 4 (four) hours as  needed.  Marland Kitchen aspirin 81 MG tablet Take 81 mg by mouth at bedtime.   Marland Kitchen atorvastatin (LIPITOR) 80 MG tablet TAKE 1 TABLET BY MOUTH EVERY DAY IN THE EVENING  . enalapril (VASOTEC) 20 MG tablet Take 1 tablet (20 mg total) by mouth 2 (two) times daily.  . ferrous sulfate (FERROUSUL) 325 (65 FE) MG tablet Take 1 tablet (325 mg total) by mouth daily with breakfast.  . fish oil-omega-3 fatty acids 1000 MG capsule Take 1 g by mouth 3 (three) times daily.   Marland Kitchen guaiFENesin (MUCINEX PO) Take by mouth as needed.   . metoprolol tartrate (LOPRESSOR) 50 MG tablet Take 1 tablet (50 mg total) by mouth 3 (three) times daily.  . Multiple Vitamin (MULTIVITAMIN) tablet Take 1 tablet by mouth daily. With FeSo4  . nitroGLYCERIN (NITROSTAT) 0.4 MG SL tablet Place 1 tablet (0.4 mg total) under the tongue every 5 (five) minutes as needed for chest pain.  Marland Kitchen omeprazole (PRILOSEC) 20 MG capsule TAKE 1 CAPSULE BY MOUTH EVERY DAY  . potassium chloride SA (KLOR-CON M20) 20 MEQ tablet Take 1 tablet (20 mEq total) by mouth daily.  Marland Kitchen torsemide (DEMADEX) 20 MG tablet Take 2 tablets (40 mg total) by mouth daily.   No facility-administered encounter medications on file as of 04/07/2019.     Review of Systems  Constitutional: Negative for chills and fever.  Eyes: Negative for visual disturbance.  Respiratory: Negative for shortness of breath and wheezing.   Cardiovascular: Negative for chest pain and leg swelling.  Musculoskeletal: Negative for back pain and gait problem.  Skin: Negative for rash.  Neurological: Negative for dizziness, weakness  and light-headedness.  All other systems reviewed and are negative.   Observations/Objective: Patient sounds comfortable and in no acute distress on the phone  Assessment and Plan: Problem List Items Addressed This Visit      Cardiovascular and Mediastinum   Hypertension associated with diabetes (HCC)     Endocrine   Hyperlipidemia associated with type 2 diabetes mellitus (HCC)    Controlled diabetes mellitus with stage 3 chronic kidney disease (HCC) - Primary       Follow Up Instructions:  Sounds like patient is doing pretty good, the only things were monitor really closely for last time with his hemoglobin and his renal function which have been stable for some time, he has been continuing to take his iron will get denies any symptoms for anything.  Follow-up in 3 months we will do blood work for him.   I discussed the assessment and treatment plan with the patient. The patient was provided an opportunity to ask questions and all were answered. The patient agreed with the plan and demonstrated an understanding of the instructions.   The patient was advised to call back or seek an in-person evaluation if the symptoms worsen or if the condition fails to improve as anticipated.  The above assessment and management plan was discussed with the patient. The patient verbalized understanding of and has agreed to the management plan. Patient is aware to call the clinic if symptoms persist or worsen. Patient is aware when to return to the clinic for a follow-up visit. Patient educated on when it is appropriate to go to the emergency department.    I provided 9 minutes of non-face-to-face time during this encounter.    [name], MD

## 2019-04-08 ENCOUNTER — Telehealth (INDEPENDENT_AMBULATORY_CARE_PROVIDER_SITE_OTHER): Payer: Medicare HMO | Admitting: Cardiology

## 2019-04-08 ENCOUNTER — Encounter: Payer: Self-pay | Admitting: Cardiology

## 2019-04-08 VITALS — BP 130/72 | HR 66 | Ht 71.0 in | Wt 190.0 lb

## 2019-04-08 DIAGNOSIS — E785 Hyperlipidemia, unspecified: Secondary | ICD-10-CM

## 2019-04-08 DIAGNOSIS — I10 Essential (primary) hypertension: Secondary | ICD-10-CM

## 2019-04-08 DIAGNOSIS — I255 Ischemic cardiomyopathy: Secondary | ICD-10-CM | POA: Diagnosis not present

## 2019-04-08 DIAGNOSIS — I5042 Chronic combined systolic (congestive) and diastolic (congestive) heart failure: Secondary | ICD-10-CM

## 2019-04-08 DIAGNOSIS — I2510 Atherosclerotic heart disease of native coronary artery without angina pectoris: Secondary | ICD-10-CM

## 2019-04-08 DIAGNOSIS — U071 COVID-19: Secondary | ICD-10-CM

## 2019-04-08 LAB — CUP PACEART REMOTE DEVICE CHECK
Date Time Interrogation Session: 20200512123159
Implantable Lead Implant Date: 20110209
Implantable Lead Location: 753860
Implantable Lead Model: 7120
Implantable Pulse Generator Implant Date: 20110209
Pulse Gen Serial Number: 747960

## 2019-04-08 NOTE — Patient Instructions (Addendum)
Medication Instructions:   Your physician recommends that you continue on your current medications as directed. Please refer to the Current Medication list given to you today.  Labwork:  NONE  Testing/Procedures:  NONE  Follow-Up:  Your physician recommends that you schedule a follow-up appointment in: 4 month in the office.  Any Other Special Instructions Will Be Listed Below (If Applicable).  If you need a refill on your cardiac medications before your next appointment, please call your pharmacy.

## 2019-04-15 NOTE — Progress Notes (Signed)
Remote ICD transmission.   

## 2019-05-07 ENCOUNTER — Ambulatory Visit: Payer: Self-pay | Admitting: Cardiology

## 2019-05-21 ENCOUNTER — Telehealth: Payer: Self-pay | Admitting: Cardiology

## 2019-05-21 MED ORDER — ENALAPRIL MALEATE 20 MG PO TABS: 20.0000 mg | ORAL_TABLET | Freq: Two times a day (BID) | ORAL | 3 refills | Status: DC

## 2019-05-21 NOTE — Telephone Encounter (Signed)
DONE

## 2019-05-21 NOTE — Telephone Encounter (Signed)
Needs refill on Enalapirl sent to [facility] in [place] / tg

## 2019-05-26 ENCOUNTER — Other Ambulatory Visit: Payer: Self-pay | Admitting: Cardiology

## 2019-05-26 MED ORDER — POTASSIUM CHLORIDE CRYS ER 20 MEQ PO TBCR: 20.0000 meq | EXTENDED_RELEASE_TABLET | Freq: Every day | ORAL | 3 refills | Status: DC

## 2019-05-26 NOTE — Telephone Encounter (Signed)
Refilled potassium

## 2019-05-26 NOTE — Telephone Encounter (Signed)
Needs refill on Klor Con sent to [place] / tg

## 2019-06-04 DIAGNOSIS — Z20828 Contact with and (suspected) exposure to other viral communicable diseases: Secondary | ICD-10-CM | POA: Diagnosis not present

## 2019-06-09 ENCOUNTER — Other Ambulatory Visit: Payer: Medicare HMO

## 2019-06-09 ENCOUNTER — Other Ambulatory Visit: Payer: Self-pay

## 2019-06-09 DIAGNOSIS — R809 Proteinuria, unspecified: Secondary | ICD-10-CM | POA: Diagnosis not present

## 2019-06-09 DIAGNOSIS — D509 Iron deficiency anemia, unspecified: Secondary | ICD-10-CM | POA: Diagnosis not present

## 2019-06-09 DIAGNOSIS — E559 Vitamin D deficiency, unspecified: Secondary | ICD-10-CM | POA: Diagnosis not present

## 2019-06-09 DIAGNOSIS — Z79899 Other long term (current) drug therapy: Secondary | ICD-10-CM | POA: Diagnosis not present

## 2019-06-09 DIAGNOSIS — I10 Essential (primary) hypertension: Secondary | ICD-10-CM | POA: Diagnosis not present

## 2019-06-09 DIAGNOSIS — N183 Chronic kidney disease, stage 3 (moderate): Secondary | ICD-10-CM | POA: Diagnosis not present

## 2019-07-07 ENCOUNTER — Ambulatory Visit (INDEPENDENT_AMBULATORY_CARE_PROVIDER_SITE_OTHER): Payer: Medicare HMO | Admitting: *Deleted

## 2019-07-07 DIAGNOSIS — I5022 Chronic systolic (congestive) heart failure: Secondary | ICD-10-CM | POA: Diagnosis not present

## 2019-07-07 DIAGNOSIS — I255 Ischemic cardiomyopathy: Secondary | ICD-10-CM

## 2019-07-10 LAB — CUP PACEART REMOTE DEVICE CHECK
Battery Remaining Longevity: 34 mo
Battery Remaining Percentage: 29 %
Battery Voltage: 2.84 V
Brady Statistic RV Percent Paced: 1 %
Date Time Interrogation Session: 20200810081051
HighPow Impedance: 53 Ohm
Implantable Lead Implant Date: 20110209
Implantable Lead Location: 753860
Implantable Lead Model: 7120
Implantable Pulse Generator Implant Date: 20110209
Lead Channel Impedance Value: 540 Ohm
Lead Channel Pacing Threshold Amplitude: 0.75 V
Lead Channel Pacing Threshold Pulse Width: 0.4 ms
Lead Channel Sensing Intrinsic Amplitude: 12 mV
Lead Channel Setting Pacing Amplitude: 2.5 V
Lead Channel Setting Pacing Pulse Width: 0.4 ms
Lead Channel Setting Sensing Sensitivity: 0.5 mV
Pulse Gen Serial Number: 747960

## 2019-07-14 ENCOUNTER — Other Ambulatory Visit: Payer: Medicare HMO

## 2019-07-14 ENCOUNTER — Other Ambulatory Visit: Payer: Self-pay

## 2019-07-14 DIAGNOSIS — E1159 Type 2 diabetes mellitus with other circulatory complications: Secondary | ICD-10-CM

## 2019-07-14 DIAGNOSIS — E785 Hyperlipidemia, unspecified: Secondary | ICD-10-CM

## 2019-07-14 DIAGNOSIS — Z125 Encounter for screening for malignant neoplasm of prostate: Secondary | ICD-10-CM | POA: Diagnosis not present

## 2019-07-14 DIAGNOSIS — N183 Chronic kidney disease, stage 3 unspecified: Secondary | ICD-10-CM

## 2019-07-14 DIAGNOSIS — E1169 Type 2 diabetes mellitus with other specified complication: Secondary | ICD-10-CM

## 2019-07-14 DIAGNOSIS — E1122 Type 2 diabetes mellitus with diabetic chronic kidney disease: Secondary | ICD-10-CM

## 2019-07-14 DIAGNOSIS — I10 Essential (primary) hypertension: Secondary | ICD-10-CM | POA: Diagnosis not present

## 2019-07-14 DIAGNOSIS — I152 Hypertension secondary to endocrine disorders: Secondary | ICD-10-CM

## 2019-07-14 LAB — BAYER DCA HB A1C WAIVED: HB A1C (BAYER DCA - WAIVED): 6 % (ref <7.0)

## 2019-07-15 LAB — CMP14+EGFR
ALT: 22 IU/L (ref 0–44)
AST: 21 IU/L (ref 0–40)
Albumin/Globulin Ratio: 1.7 (ref 1.2–2.2)
Albumin: 4.1 g/dL (ref 3.8–4.8)
Alkaline Phosphatase: 129 IU/L — ABNORMAL HIGH (ref 39–117)
BUN/Creatinine Ratio: 14 (ref 10–24)
BUN: 20 mg/dL (ref 8–27)
Bilirubin Total: 0.4 mg/dL (ref 0.0–1.2)
CO2: 25 mmol/L (ref 20–29)
Calcium: 9 mg/dL (ref 8.6–10.2)
Chloride: 99 mmol/L (ref 96–106)
Creatinine, Ser: 1.39 mg/dL — ABNORMAL HIGH (ref 0.76–1.27)
GFR calc Af Amer: 61 mL/min/{1.73_m2} (ref >59)
GFR calc non Af Amer: 53 mL/min/{1.73_m2} — ABNORMAL LOW (ref >59)
Globulin, Total: 2.4 g/dL (ref 1.5–4.5)
Glucose: 101 mg/dL — ABNORMAL HIGH (ref 65–99)
Potassium: 4.2 mmol/L (ref 3.5–5.2)
Sodium: 140 mmol/L (ref 134–144)
Total Protein: 6.5 g/dL (ref 6.0–8.5)

## 2019-07-15 LAB — CBC WITH DIFFERENTIAL/PLATELET
Basophils Absolute: 0 10*3/uL (ref 0.0–0.2)
Basos: 0 %
EOS (ABSOLUTE): 0.1 10*3/uL (ref 0.0–0.4)
Eos: 1 %
Hematocrit: 39.9 % (ref 37.5–51.0)
Hemoglobin: 13.1 g/dL (ref 13.0–17.7)
Immature Grans (Abs): 0 10*3/uL (ref 0.0–0.1)
Immature Granulocytes: 0 %
Lymphocytes Absolute: 1.4 10*3/uL (ref 0.7–3.1)
Lymphs: 15 %
MCH: 28.7 pg (ref 26.6–33.0)
MCHC: 32.8 g/dL (ref 31.5–35.7)
MCV: 88 fL (ref 79–97)
Monocytes Absolute: 0.6 10*3/uL (ref 0.1–0.9)
Monocytes: 6 %
Neutrophils Absolute: 7.1 10*3/uL — ABNORMAL HIGH (ref 1.4–7.0)
Neutrophils: 78 %
Platelets: 246 10*3/uL (ref 150–450)
RBC: 4.56 x10E6/uL (ref 4.14–5.80)
RDW: 13.9 % (ref 11.6–15.4)
WBC: 9.3 10*3/uL (ref 3.4–10.8)

## 2019-07-15 LAB — LIPID PANEL
Chol/HDL Ratio: 3.2 ratio (ref 0.0–5.0)
Cholesterol, Total: 123 mg/dL (ref 100–199)
HDL: 38 mg/dL — ABNORMAL LOW (ref >39)
LDL Calculated: 68 mg/dL (ref 0–99)
Triglycerides: 84 mg/dL (ref 0–149)
VLDL Cholesterol Cal: 17 mg/dL (ref 5–40)

## 2019-07-15 LAB — PSA, TOTAL AND FREE
PSA, Free Pct: 28 %
PSA, Free: 0.28 ng/mL
Prostate Specific Ag, Serum: 1 ng/mL (ref 0.0–4.0)

## 2019-07-15 NOTE — Progress Notes (Signed)
Remote ICD transmission.   

## 2019-07-16 ENCOUNTER — Ambulatory Visit (INDEPENDENT_AMBULATORY_CARE_PROVIDER_SITE_OTHER): Payer: Medicare HMO | Admitting: Family Medicine

## 2019-07-16 ENCOUNTER — Encounter: Payer: Self-pay | Admitting: Family Medicine

## 2019-07-16 DIAGNOSIS — I10 Essential (primary) hypertension: Secondary | ICD-10-CM

## 2019-07-16 DIAGNOSIS — N183 Chronic kidney disease, stage 3 unspecified: Secondary | ICD-10-CM

## 2019-07-16 DIAGNOSIS — E1169 Type 2 diabetes mellitus with other specified complication: Secondary | ICD-10-CM | POA: Diagnosis not present

## 2019-07-16 DIAGNOSIS — E785 Hyperlipidemia, unspecified: Secondary | ICD-10-CM | POA: Diagnosis not present

## 2019-07-16 DIAGNOSIS — E1122 Type 2 diabetes mellitus with diabetic chronic kidney disease: Secondary | ICD-10-CM | POA: Diagnosis not present

## 2019-07-16 DIAGNOSIS — E1159 Type 2 diabetes mellitus with other circulatory complications: Secondary | ICD-10-CM | POA: Diagnosis not present

## 2019-07-16 DIAGNOSIS — I152 Hypertension secondary to endocrine disorders: Secondary | ICD-10-CM

## 2019-07-16 NOTE — Progress Notes (Signed)
Virtual Visit via telephone Note  I connected with Joseph [name] on 07/16/19 at 1125 by telephone and verified that I am speaking with the correct person using two identifiers. Joseph [name] is currently located at brother in laws house and no other people are currently with her during visit. The provider, Elige Radon Hillari Zumwalt, MD is located in their office at time of visit.  Call ended at 1135  I discussed the limitations, risks, security and privacy concerns of performing an evaluation and management service by telephone and the availability of in person appointments. I also discussed with the patient that there may be a patient responsible charge related to this service. The patient expressed understanding and agreed to proceed.   History and Present Illness: Type 2 diabetes mellitus Patient comes in today for recheck of his diabetes. Patient has been currently taking diet controlled and a1c 6.0. Patient is currently on an ACE inhibitor/ARB. Patient has not seen an ophthalmologist this year. Patient denies any issues with their feet.   Hyperlipidemia Patient is coming in for recheck of his hyperlipidemia. The patient is currently taking lipitor. They deny any issues with myalgias or history of liver damage from it. They deny any focal numbness or weakness or chest pain.   Hypertension Patient is currently on enalapril and metoprolol, and their blood pressure today is 118/70. Patient denies any lightheadedness or dizziness. Patient denies headaches, blurred vision, chest pains, shortness of breath, or weakness. Denies any side effects from medication and is content with current medication.   No diagnosis found.  Outpatient Encounter Medications as of 07/16/2019  Medication Sig  . acetaminophen (TYLENOL) 500 MG tablet Take 500 mg by mouth every 6 (six) hours as needed for pain.  Marland Kitchen albuterol (PROVENTIL HFA;VENTOLIN HFA) 108 (90 Base) MCG/ACT inhaler Inhale 2 puffs into the lungs every 4  (four) hours as needed.  Marland Kitchen aspirin 81 MG tablet Take 81 mg by mouth at bedtime.   Marland Kitchen atorvastatin (LIPITOR) 80 MG tablet TAKE 1 TABLET BY MOUTH EVERY DAY IN THE EVENING  . enalapril (VASOTEC) 20 MG tablet Take 1 tablet (20 mg total) by mouth 2 (two) times daily.  . ferrous sulfate (FERROUSUL) 325 (65 FE) MG tablet Take 1 tablet (325 mg total) by mouth daily with breakfast.  . fish oil-omega-3 fatty acids 1000 MG capsule Take 1 g by mouth 3 (three) times daily.   Marland Kitchen guaiFENesin (MUCINEX PO) Take 1 tablet by mouth as needed.   . metoprolol tartrate (LOPRESSOR) 50 MG tablet Take 1 tablet (50 mg total) by mouth 3 (three) times daily.  . Multiple Vitamin (MULTIVITAMIN) tablet Take 1 tablet by mouth daily. With FeSo4  . nitroGLYCERIN (NITROSTAT) 0.4 MG SL tablet Place 1 tablet (0.4 mg total) under the tongue every 5 (five) minutes as needed for chest pain.  Marland Kitchen omeprazole (PRILOSEC) 20 MG capsule TAKE 1 CAPSULE BY MOUTH EVERY DAY  . potassium chloride SA (KLOR-CON M20) 20 MEQ tablet Take 1 tablet (20 mEq total) by mouth daily.  Marland Kitchen torsemide (DEMADEX) 20 MG tablet Take 2 tablets (40 mg total) by mouth daily. (Patient taking differently: Take 20 mg by mouth 2 (two) times daily. )   No facility-administered encounter medications on file as of 07/16/2019.     Review of Systems  Constitutional: Negative for chills and fever.  Eyes: Negative for visual disturbance.  Respiratory: Negative for shortness of breath and wheezing.   Cardiovascular: Negative for chest pain and leg swelling.  Gastrointestinal:  Negative for abdominal pain.  Musculoskeletal: Negative for back pain and gait problem.  Skin: Negative for rash.  Neurological: Negative for dizziness, weakness and light-headedness.  All other systems reviewed and are negative.   Observations/Objective: Patient sounds comfortable and in no acute distress  Assessment and Plan: Problem List Items Addressed This Visit      Cardiovascular and  Mediastinum   Hypertension associated with diabetes ([place])     Endocrine   Hyperlipidemia associated with type 2 diabetes mellitus ([place])   Controlled diabetes mellitus with stage 3 chronic kidney disease ([place]) - Primary    Other Visit Diagnoses    Chronic kidney disease (CKD), stage III (moderate) ([place])           Follow Up Instructions:  Recheck in 3 months  Kidney seems stable and the blood work looks great from a couple days ago and his A1c was 6.0 so no changes in medication and continue current course.  He is diet controlled diabetic.  Most of his other medications from cardiology and it looks like they will continue to prescribe those but he will call if he needs any help from [place].   I discussed the assessment and treatment plan with the patient. The patient was provided an opportunity to ask questions and all were answered. The patient agreed with the plan and demonstrated an understanding of the instructions.   The patient was advised to call back or seek an in-person evaluation if the symptoms worsen or if the condition fails to improve as anticipated.  The above assessment and management plan was discussed with the patient. The patient verbalized understanding of and has agreed to the management plan. Patient is aware to call the clinic if symptoms persist or worsen. Patient is aware when to return to the clinic for a follow-up visit. Patient educated on when it is appropriate to go to the emergency department.    I provided 10 minutes of non-face-to-face time during this encounter.    Worthy Rancher, MD

## 2019-07-29 DIAGNOSIS — M9080 Osteopathy in diseases classified elsewhere, unspecified site: Secondary | ICD-10-CM | POA: Diagnosis not present

## 2019-07-29 DIAGNOSIS — N183 Chronic kidney disease, stage 3 (moderate): Secondary | ICD-10-CM | POA: Diagnosis not present

## 2019-07-29 DIAGNOSIS — I5030 Unspecified diastolic (congestive) heart failure: Secondary | ICD-10-CM | POA: Diagnosis not present

## 2019-07-29 DIAGNOSIS — I10 Essential (primary) hypertension: Secondary | ICD-10-CM | POA: Diagnosis not present

## 2019-07-29 DIAGNOSIS — D649 Anemia, unspecified: Secondary | ICD-10-CM | POA: Diagnosis not present

## 2019-07-29 DIAGNOSIS — D508 Other iron deficiency anemias: Secondary | ICD-10-CM | POA: Diagnosis not present

## 2019-07-31 ENCOUNTER — Encounter: Payer: Self-pay | Admitting: Internal Medicine

## 2019-07-31 ENCOUNTER — Ambulatory Visit (INDEPENDENT_AMBULATORY_CARE_PROVIDER_SITE_OTHER): Payer: Medicare HMO | Admitting: Internal Medicine

## 2019-07-31 ENCOUNTER — Other Ambulatory Visit: Payer: Self-pay

## 2019-07-31 DIAGNOSIS — I255 Ischemic cardiomyopathy: Secondary | ICD-10-CM | POA: Diagnosis not present

## 2019-07-31 LAB — CUP PACEART INCLINIC DEVICE CHECK
Date Time Interrogation Session: 20200903090405
Implantable Lead Implant Date: 20110209
Implantable Lead Location: 753860
Implantable Lead Model: 7120
Implantable Pulse Generator Implant Date: 20110209
Pulse Gen Serial Number: 747960

## 2019-07-31 NOTE — Patient Instructions (Signed)
Medication Instructions:  Your physician recommends that you continue on your current medications as directed. Please refer to the Current Medication list given to you today.  If you need a refill on your cardiac medications before your next appointment, please call your pharmacy.   Lab work: NONE   If you have labs (blood work) drawn today and your tests are completely normal, you will receive your results only by: Marland Kitchen MyChart Message (if you have MyChart) OR . A paper copy in the mail If you have any lab test that is abnormal or we need to change your treatment, we will call you to review the results.  Testing/Procedures: NONE   Follow-Up: At [facility], you and your health needs are our priority.  As part of our continuing mission to provide you with exceptional heart care, we have created designated Provider Care Teams.  These Care Teams include your primary Cardiologist (physician) and Advanced Practice Providers (APPs -  Physician Assistants and Nurse Practitioners) who all work together to provide you with the care you need, when you need it. You will need a follow up appointment in 1 months.  Please call our office 2 months in advance to schedule this appointment.  You may see [name], MD or one of the following Advanced Practice Providers on your designated Care Team:   [name], NP . [name], PA-C  Any Other Special Instructions Will Be Listed Below (If Applicable). Thank you for choosing [facility]!

## 2019-07-31 NOTE — Progress Notes (Signed)
HPI [name] returns today for followup. He is a pleasant 63 yo man with a h/o chronic systolic heart failure, s/p ICD insertion. In the interim he has not had an ICD shock. He denies syncope. No edema. No Known Allergies   Current Outpatient Medications  Medication Sig Dispense Refill  . acetaminophen (TYLENOL) 500 MG tablet Take 500 mg by mouth every 6 (six) hours as needed for pain.    Marland Kitchen albuterol (PROVENTIL HFA;VENTOLIN HFA) 108 (90 Base) MCG/ACT inhaler Inhale 2 puffs into the lungs every 4 (four) hours as needed. 6.7 g 0  . aspirin 81 MG tablet Take 81 mg by mouth at bedtime.     Marland Kitchen atorvastatin (LIPITOR) 80 MG tablet TAKE 1 TABLET BY MOUTH EVERY DAY IN THE EVENING 90 tablet 3  . enalapril (VASOTEC) 20 MG tablet Take 1 tablet (20 mg total) by mouth 2 (two) times daily. 180 tablet 3  . ferrous sulfate (FERROUSUL) 325 (65 FE) MG tablet Take 1 tablet (325 mg total) by mouth daily with breakfast. 90 tablet 3  . fish oil-omega-3 fatty acids 1000 MG capsule Take 1 g by mouth 3 (three) times daily.     Marland Kitchen guaiFENesin (MUCINEX PO) Take 1 tablet by mouth as needed.     . metoprolol tartrate (LOPRESSOR) 50 MG tablet Take 1 tablet (50 mg total) by mouth 3 (three) times daily. 270 tablet 3  . Multiple Vitamin (MULTIVITAMIN) tablet Take 1 tablet by mouth daily. With FeSo4    . nitroGLYCERIN (NITROSTAT) 0.4 MG SL tablet Place 1 tablet (0.4 mg total) under the tongue every 5 (five) minutes as needed for chest pain. 25 tablet 3  . omeprazole (PRILOSEC) 20 MG capsule TAKE 1 CAPSULE BY MOUTH EVERY DAY 90 capsule 2  . potassium chloride SA (KLOR-CON M20) 20 MEQ tablet Take 1 tablet (20 mEq total) by mouth daily. 90 tablet 3  . torsemide (DEMADEX) 20 MG tablet Take 2 tablets (40 mg total) by mouth daily. (Patient taking differently: Take 20 mg by mouth 2 (two) times daily. ) 180 tablet 3   No current facility-administered medications for this visit.      Past Medical History:  Diagnosis Date   . CHF (congestive heart failure) (HCC)    a. EF 25-30% in 2016 b. 35-40% by echo in 10/2017  . COPD (chronic obstructive pulmonary disease) (HCC)   . Coronary artery disease   . GI bleed    Mallory-Weiss tear EGD 02/2010  . Hyperlipidemia   . Hypertension   . Ileus (HCC) 2006   required hospitalization  . Left ventricular dysfunction    apical thrombus  . MRSA bacteremia   . 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 Jude  . Small bowel obstruction (HCC) 2007   resolved without surgery    ROS:   All systems reviewed and negative except as noted in the HPI.   Past Surgical History:  Procedure Laterality Date  . basilic vein left arm resection    . CARDIAC DEFIBRILLATOR PLACEMENT    . CORONARY ARTERY BYPASS GRAFT     6 vessels, 2000  . INSERT / REPLACE / REMOVE PACEMAKER  01/05/10   ICD insertion/St. Jude single chamber     Family History  Problem Relation Age of Onset  . Heart attack Mother   . Emphysema Father   . Coronary artery disease Father        s/p cabg  . Colon  cancer Neg Hx      Social History   Socioeconomic History  . Marital status: Married    Spouse name: Not on file  . Number of children: 3  . Years of education: Not on file  . Highest education level: Not on file  Occupational History  . 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 Financial resource strain: Not on file  . Food insecurity    Worry: Not on file    Inability: Not on file  . Transportation needs    Medical: Not on file    Non-medical: Not on file  Tobacco Use  . Smoking status: Former Smoker    Types: Cigarettes    Start date: 11/28/1967    Quit date: 02/29/1988    Years since quitting: 31.4  . Smokeless tobacco: Never Used  Substance and Sexual Activity  . Alcohol use: No    Alcohol/week: 0.0 standard drinks  . Drug use: No  . Sexual activity: Never  Lifestyle  . Physical activity    Days per week:  Not on file    Minutes per session: Not on file  . Stress: Not on file  Relationships  . Social Herbalist on phone: Not on file    Gets together: Not on file    Attends religious service: Not on file    Active member of club or organization: Not on file    Attends meetings of clubs or organizations: Not on file    Relationship status: Not on file  . Intimate partner violence    Fear of current or ex partner: Not on file    Emotionally abused: Not on file    Physically abused: Not on file    Forced sexual activity: Not on file  Other Topics Concern  . Not on file  Social History Narrative   1 son died age 3-Joseph [name]'s syndrome           BP (!) 146/79   Pulse 91   Temp 97.8 F (36.6 C)   Ht 5\' 11"  (1.803 m)   Wt 197 lb 6.4 oz (89.5 kg)   SpO2 96%   BMI 27.53 kg/m   Physical Exam:  Well appearing 63 yo man, NAD HEENT: Unremarkable Neck:  No JVD, no thyromegally Lymphatics:  No adenopathy Back:  No CVA tenderness Lungs:  Clear with no wheezes HEART:  Regular rate rhythm, no murmurs, no rubs, no clicks Abd:  soft, positive bowel sounds, no organomegally, no rebound, no guarding Ext:  2 plus pulses, no edema, no cyanosis, no clubbing Skin:  No rashes no nodules Neuro:  CN II through XII intact, motor grossly intact  DEVICE  Normal device function.  See PaceArt for details.   Assess/Plan: 1. ICD insertion - his St. Jude single chamber ICD is working normally. We will recheck in several months. 2. Chronic systolic heart failure - his symptoms are well controlled.  3. HTN - his systolic pressure is up a bit but he thinks it is better at home.  [name].D.

## 2019-08-11 DIAGNOSIS — N183 Chronic kidney disease, stage 3 unspecified: Secondary | ICD-10-CM | POA: Insufficient documentation

## 2019-08-11 NOTE — Progress Notes (Signed)
HPI The patient returns for followup of coronary disease and cardiomyopathy.  Since I last saw him he has done very well.  His blood pressure was up slightly at a recent visit with back Ladona Ridgel. The patient denies any new symptoms such as chest discomfort, neck or arm discomfort. There has been no new shortness of breath, PND or orthopnea. There have been no reported palpitations, presyncope or syncope.  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  He does his chores of daily living and a little bit of exercise and walking.    No Known Allergies  Current Outpatient Medications  Medication Sig Dispense Refill  . acetaminophen (TYLENOL) 500 MG tablet Take 500 mg by mouth every 6 (six) hours as needed for pain.    Marland Kitchen albuterol (PROVENTIL HFA;VENTOLIN HFA) 108 (90 Base) MCG/ACT inhaler Inhale 2 puffs into the lungs every 4 (four) hours as needed. 6.7 g 0  . aspirin 81 MG tablet Take 81 mg by mouth at bedtime.     Marland Kitchen atorvastatin (LIPITOR) 80 MG tablet TAKE 1 TABLET BY MOUTH EVERY DAY IN THE EVENING 90 tablet 3  . enalapril (VASOTEC) 20 MG tablet Take 1 tablet (20 mg total) by mouth 2 (two) times daily. 180 tablet 3  . ferrous sulfate (FERROUSUL) 325 (65 FE) MG tablet Take 1 tablet (325 mg total) by mouth daily with breakfast. 90 tablet 3  . fish oil-omega-3 fatty acids 1000 MG capsule Take 1 g by mouth 3 (three) times daily.     Marland Kitchen guaiFENesin (MUCINEX PO) Take 1 tablet by mouth as needed.     . metoprolol tartrate (LOPRESSOR) 50 MG tablet Take 1 tablet (50 mg total) by mouth 3 (three) times daily. 270 tablet 3  . Multiple Vitamin (MULTIVITAMIN) tablet Take 1 tablet by mouth daily. With FeSo4    . nitroGLYCERIN (NITROSTAT) 0.4 MG SL tablet Place 1 tablet (0.4 mg total) under the tongue every 5 (five) minutes as needed for chest pain. 25 tablet 3  . omeprazole (PRILOSEC) 20 MG capsule TAKE 1 CAPSULE BY MOUTH EVERY DAY 90 capsule 2  . potassium chloride SA (KLOR-CON M20) 20 MEQ tablet Take 1 tablet  (20 mEq total) by mouth daily. 90 tablet 3  . torsemide (DEMADEX) 20 MG tablet Take 2 tablets (40 mg total) by mouth daily. (Patient taking differently: Take 20 mg by mouth 2 (two) times daily. ) 180 tablet 3   No current facility-administered medications for this visit.   Is  Past Medical History:  Diagnosis Date  . CHF (congestive heart failure) (HCC)    a. EF 25-30% in 2016 b. 35-40% by echo in 10/2017  . COPD (chronic obstructive pulmonary disease) (HCC)   . Coronary artery disease   . GI bleed    Mallory-Weiss tear EGD 02/2010  . Hyperlipidemia   . Hypertension   . Ileus (HCC) 2006   required hospitalization  . Left ventricular dysfunction    apical thrombus  . MRSA bacteremia   . 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 Jude  . Small bowel obstruction (HCC) 2007   resolved without surgery    Past Surgical History:  Procedure Laterality Date  . basilic vein left arm resection    . CARDIAC DEFIBRILLATOR PLACEMENT    . CORONARY ARTERY BYPASS GRAFT     6 vessels, 2000  . INSERT / REPLACE / REMOVE PACEMAKER  01/05/10   ICD insertion/St. Jude single chamber  ROS   As stated in the HPI and negative for all other systems.   PHYSICAL EXAM BP 138/78   Pulse (!) 58   Ht 5\' 11"  (1.803 m)   Wt 193 lb (87.5 kg)   BMI 26.92 kg/m   GENERAL:  Well appearing NECK:  No jugular venous distention, waveform within normal limits, carotid upstroke brisk and symmetric, no bruits, no thyromegaly LUNGS:  Clear to auscultation bilaterally CHEST:  Well healed ICD pocket.   HEART:  PMI not displaced or sustained,S1 and S2 within normal limits, no S3, no S4, no clicks, no rubs, 2 out of 6 apical systolic murmur radiating slightly at the aortic outflow tract, no diastolic murmurs ABD:  Flat, positive bowel sounds normal in frequency in pitch, no bruits, no rebound, no guarding, no midline pulsatile mass, no hepatomegaly, no splenomegaly EXT:  2  plus pulses throughout, no edema, no cyanosis no clubbing    Lab Results  Component Value Date   CHOL 123 07/14/2019   TRIG 84 07/14/2019   HDL 38 (L) 07/14/2019   LDLCALC 68 07/14/2019    EKG:   Sinus rhythm, rate 58, axis within normal limits, intervals within normal limits, no acute ST-T wave changes.  Old anterior infarct.  No change from previous.   ASSESSMENT AND PLAN  Coronary artery disease -  The patient has no new sypmtoms.  No further cardiovascular testing is indicated.  We will continue with aggressive risk reduction and meds as listed.  CARDIOMYOPATHY -  EF was 35 - 40%.    He seems to be euvolemic.  No change in therapy.  ICD - He is up-to-date with follow-up.  He has normal device function.  No change in therapy.  DYSLIPIDEMIA - His LDL was 68.  We will continue the meds as listed.  DM - His A1c was 6.0 in Feb. no change in therapy.  CKD - Creatinine was 1.39.  This is stable.  HTN - His blood pressure.  Is slightly up above where I would like him.  He is getting keep a blood pressure diary and I might make an adjustment although he has not tolerated med changes in the past.  In particular he did not tolerate Entresto.

## 2019-08-13 ENCOUNTER — Ambulatory Visit (INDEPENDENT_AMBULATORY_CARE_PROVIDER_SITE_OTHER): Payer: Medicare HMO | Admitting: Cardiology

## 2019-08-13 ENCOUNTER — Other Ambulatory Visit: Payer: Self-pay

## 2019-08-13 ENCOUNTER — Encounter: Payer: Self-pay | Admitting: Cardiology

## 2019-08-13 VITALS — BP 138/78 | HR 58 | Ht 71.0 in | Wt 193.0 lb

## 2019-08-13 DIAGNOSIS — N183 Chronic kidney disease, stage 3 unspecified: Secondary | ICD-10-CM

## 2019-08-13 DIAGNOSIS — E785 Hyperlipidemia, unspecified: Secondary | ICD-10-CM

## 2019-08-13 DIAGNOSIS — I255 Ischemic cardiomyopathy: Secondary | ICD-10-CM | POA: Diagnosis not present

## 2019-08-13 DIAGNOSIS — I2510 Atherosclerotic heart disease of native coronary artery without angina pectoris: Secondary | ICD-10-CM

## 2019-08-13 NOTE — Patient Instructions (Signed)
Medication Instructions:  The current medical regimen is effective;  continue present plan and medications.  If you need a refill on your cardiac medications before your next appointment, please call your pharmacy.   Follow-Up: Follow up in 6 months with Dr. Hochrein.  You will receive a letter in the mail 2 months before you are due.  Please call us when you receive this letter to schedule your follow up appointment.  Thank you for choosing [facility]!!      

## 2019-09-05 ENCOUNTER — Other Ambulatory Visit: Payer: Self-pay | Admitting: Cardiology

## 2019-09-13 ENCOUNTER — Other Ambulatory Visit: Payer: Self-pay | Admitting: Student

## 2019-10-06 ENCOUNTER — Ambulatory Visit (INDEPENDENT_AMBULATORY_CARE_PROVIDER_SITE_OTHER): Payer: Medicare HMO | Admitting: *Deleted

## 2019-10-06 DIAGNOSIS — I255 Ischemic cardiomyopathy: Secondary | ICD-10-CM

## 2019-10-06 DIAGNOSIS — I5022 Chronic systolic (congestive) heart failure: Secondary | ICD-10-CM

## 2019-10-07 LAB — CUP PACEART REMOTE DEVICE CHECK
Battery Remaining Longevity: 31 mo
Battery Remaining Percentage: 26 %
Battery Voltage: 2.83 V
Brady Statistic RV Percent Paced: 1 %
Date Time Interrogation Session: 20201109090031
HighPow Impedance: 49 Ohm
Implantable Lead Implant Date: 20110209
Implantable Lead Location: 753860
Implantable Lead Model: 7120
Implantable Pulse Generator Implant Date: 20110209
Lead Channel Impedance Value: 480 Ohm
Lead Channel Pacing Threshold Amplitude: 0.75 V
Lead Channel Pacing Threshold Pulse Width: 0.4 ms
Lead Channel Sensing Intrinsic Amplitude: 12 mV
Lead Channel Setting Pacing Amplitude: 2.5 V
Lead Channel Setting Pacing Pulse Width: 0.4 ms
Lead Channel Setting Sensing Sensitivity: 0.5 mV
Pulse Gen Serial Number: 747960

## 2019-10-27 ENCOUNTER — Other Ambulatory Visit: Payer: Self-pay

## 2019-10-27 ENCOUNTER — Other Ambulatory Visit: Payer: Medicare HMO

## 2019-10-27 DIAGNOSIS — E1122 Type 2 diabetes mellitus with diabetic chronic kidney disease: Secondary | ICD-10-CM | POA: Diagnosis not present

## 2019-10-27 DIAGNOSIS — E1169 Type 2 diabetes mellitus with other specified complication: Secondary | ICD-10-CM | POA: Diagnosis not present

## 2019-10-27 DIAGNOSIS — E785 Hyperlipidemia, unspecified: Secondary | ICD-10-CM | POA: Diagnosis not present

## 2019-10-27 DIAGNOSIS — D649 Anemia, unspecified: Secondary | ICD-10-CM | POA: Diagnosis not present

## 2019-10-27 DIAGNOSIS — E1159 Type 2 diabetes mellitus with other circulatory complications: Secondary | ICD-10-CM | POA: Diagnosis not present

## 2019-10-27 DIAGNOSIS — E559 Vitamin D deficiency, unspecified: Secondary | ICD-10-CM | POA: Diagnosis not present

## 2019-10-27 DIAGNOSIS — Z79899 Other long term (current) drug therapy: Secondary | ICD-10-CM | POA: Diagnosis not present

## 2019-10-27 DIAGNOSIS — N183 Type 2 diabetes mellitus with diabetic chronic kidney disease: Secondary | ICD-10-CM

## 2019-10-27 DIAGNOSIS — I152 Hypertension secondary to endocrine disorders: Secondary | ICD-10-CM

## 2019-10-27 DIAGNOSIS — N1831 Chronic kidney disease, stage 3a: Secondary | ICD-10-CM | POA: Diagnosis not present

## 2019-10-27 DIAGNOSIS — R809 Proteinuria, unspecified: Secondary | ICD-10-CM | POA: Diagnosis not present

## 2019-10-27 DIAGNOSIS — I10 Essential (primary) hypertension: Secondary | ICD-10-CM | POA: Diagnosis not present

## 2019-10-27 LAB — BAYER DCA HB A1C WAIVED: HB A1C (BAYER DCA - WAIVED): 5.7 % (ref <7.0)

## 2019-10-28 LAB — LIPID PANEL
Chol/HDL Ratio: 3.3 ratio (ref 0.0–5.0)
Cholesterol, Total: 147 mg/dL (ref 100–199)
HDL: 44 mg/dL (ref >39)
LDL Chol Calc (NIH): 85 mg/dL (ref 0–99)
Triglycerides: 99 mg/dL (ref 0–149)
VLDL Cholesterol Cal: 18 mg/dL (ref 5–40)

## 2019-10-28 LAB — CBC WITH DIFFERENTIAL/PLATELET
Basophils Absolute: 0 10*3/uL (ref 0.0–0.2)
Basos: 0 %
EOS (ABSOLUTE): 0.1 10*3/uL (ref 0.0–0.4)
Eos: 1 %
Hematocrit: 42.9 % (ref 37.5–51.0)
Hemoglobin: 14.1 g/dL (ref 13.0–17.7)
Immature Grans (Abs): 0.1 10*3/uL (ref 0.0–0.1)
Immature Granulocytes: 1 %
Lymphocytes Absolute: 1.5 10*3/uL (ref 0.7–3.1)
Lymphs: 15 %
MCH: 29.7 pg (ref 26.6–33.0)
MCHC: 32.9 g/dL (ref 31.5–35.7)
MCV: 90 fL (ref 79–97)
Monocytes Absolute: 0.6 10*3/uL (ref 0.1–0.9)
Monocytes: 6 %
Neutrophils Absolute: 7.9 10*3/uL — ABNORMAL HIGH (ref 1.4–7.0)
Neutrophils: 77 %
Platelets: 255 10*3/uL (ref 150–450)
RBC: 4.75 x10E6/uL (ref 4.14–5.80)
RDW: 12.7 % (ref 11.6–15.4)
WBC: 10.2 10*3/uL (ref 3.4–10.8)

## 2019-10-28 LAB — CMP14+EGFR
ALT: 19 IU/L (ref 0–44)
AST: 17 IU/L (ref 0–40)
Albumin/Globulin Ratio: 1.7 (ref 1.2–2.2)
Albumin: 4.1 g/dL (ref 3.8–4.8)
Alkaline Phosphatase: 149 IU/L — ABNORMAL HIGH (ref 39–117)
BUN/Creatinine Ratio: 11 (ref 10–24)
BUN: 15 mg/dL (ref 8–27)
Bilirubin Total: 0.5 mg/dL (ref 0.0–1.2)
CO2: 24 mmol/L (ref 20–29)
Calcium: 9.3 mg/dL (ref 8.6–10.2)
Chloride: 105 mmol/L (ref 96–106)
Creatinine, Ser: 1.33 mg/dL — ABNORMAL HIGH (ref 0.76–1.27)
GFR calc Af Amer: 64 mL/min/{1.73_m2} (ref >59)
GFR calc non Af Amer: 56 mL/min/{1.73_m2} — ABNORMAL LOW (ref >59)
Globulin, Total: 2.4 g/dL (ref 1.5–4.5)
Glucose: 112 mg/dL — ABNORMAL HIGH (ref 65–99)
Potassium: 5 mmol/L (ref 3.5–5.2)
Sodium: 143 mmol/L (ref 134–144)
Total Protein: 6.5 g/dL (ref 6.0–8.5)

## 2019-10-29 ENCOUNTER — Other Ambulatory Visit: Payer: Self-pay

## 2019-10-30 ENCOUNTER — Ambulatory Visit (INDEPENDENT_AMBULATORY_CARE_PROVIDER_SITE_OTHER): Payer: Medicare HMO | Admitting: Family Medicine

## 2019-10-30 ENCOUNTER — Encounter: Payer: Self-pay | Admitting: Family Medicine

## 2019-10-30 DIAGNOSIS — N183 Chronic kidney disease, stage 3 unspecified: Secondary | ICD-10-CM

## 2019-10-30 DIAGNOSIS — I10 Essential (primary) hypertension: Secondary | ICD-10-CM | POA: Diagnosis not present

## 2019-10-30 DIAGNOSIS — I152 Hypertension secondary to endocrine disorders: Secondary | ICD-10-CM

## 2019-10-30 DIAGNOSIS — E1159 Type 2 diabetes mellitus with other circulatory complications: Secondary | ICD-10-CM

## 2019-10-30 DIAGNOSIS — E1122 Type 2 diabetes mellitus with diabetic chronic kidney disease: Secondary | ICD-10-CM

## 2019-10-30 DIAGNOSIS — E785 Hyperlipidemia, unspecified: Secondary | ICD-10-CM

## 2019-10-30 DIAGNOSIS — E1169 Type 2 diabetes mellitus with other specified complication: Secondary | ICD-10-CM | POA: Diagnosis not present

## 2019-10-30 DIAGNOSIS — N1831 Chronic kidney disease, stage 3a: Secondary | ICD-10-CM

## 2019-10-30 NOTE — Progress Notes (Signed)
Virtual Visit via telephone Note  I connected with Joseph [name] on 10/30/19 at 1115 by telephone and verified that I am speaking with the correct person using two identifiers. Joseph [name] is currently located at work and no other people are currently with her during visit. The provider, Elige Radon [name], MD is located in their office at time of visit.  Call ended at 1121  I discussed the limitations, risks, security and privacy concerns of performing an evaluation and management service by telephone and the availability of in person appointments. I also discussed with the patient that there may be a patient responsible charge related to this service. The patient expressed understanding and agreed to proceed.   History and Present Illness: Type 2 diabetes mellitus Patient comes in today for recheck of his diabetes. Patient has been currently taking no medication currently. Patient is currently on an ACE inhibitor/ARB. Patient has not seen an ophthalmologist this year. Patient denies any issues with their feet.  His A1c a few days ago was 5.7 and he is diet controlled  Hypertension Patient is currently on metoprolol, and their blood pressure today is unknown because he is out and about but he says is been running between the 120s and 130s most of the time at home.. Patient denies any lightheadedness or dizziness. Patient denies headaches, blurred vision, chest pains, shortness of breath, or weakness. Denies any side effects from medication and is content with current medication.   Hyperlipidemia Patient is coming in for recheck of his hyperlipidemia. The patient is currently taking atorvastatin and fish oil. They deny any issues with myalgias or history of liver damage from it. They deny any focal numbness or weakness or chest pain.   No diagnosis found.  Outpatient Encounter Medications as of 10/30/2019  Medication Sig  . acetaminophen (TYLENOL) 500 MG tablet Take 500 mg by mouth every  6 (six) hours as needed for pain.  Marland Kitchen albuterol (PROVENTIL HFA;VENTOLIN HFA) 108 (90 Base) MCG/ACT inhaler Inhale 2 puffs into the lungs every 4 (four) hours as needed.  Marland Kitchen aspirin 81 MG tablet Take 81 mg by mouth at bedtime.   Marland Kitchen atorvastatin (LIPITOR) 80 MG tablet TAKE 1 TABLET BY MOUTH EVERY DAY IN THE EVENING  . enalapril (VASOTEC) 20 MG tablet Take 1 tablet (20 mg total) by mouth 2 (two) times daily.  . ferrous sulfate (FERROUSUL) 325 (65 FE) MG tablet Take 1 tablet (325 mg total) by mouth daily with breakfast.  . fish oil-omega-3 fatty acids 1000 MG capsule Take 1 g by mouth 3 (three) times daily.   Marland Kitchen guaiFENesin (MUCINEX PO) Take 1 tablet by mouth as needed.   . metoprolol tartrate (LOPRESSOR) 50 MG tablet TAKE 1 TABLET BY MOUTH THREE TIMES A DAY  . Multiple Vitamin (MULTIVITAMIN) tablet Take 1 tablet by mouth daily. With FeSo4  . nitroGLYCERIN (NITROSTAT) 0.4 MG SL tablet Place 1 tablet (0.4 mg total) under the tongue every 5 (five) minutes as needed for chest pain.  Marland Kitchen omeprazole (PRILOSEC) 20 MG capsule TAKE 1 CAPSULE BY MOUTH EVERY DAY  . potassium chloride SA (KLOR-CON M20) 20 MEQ tablet Take 1 tablet (20 mEq total) by mouth daily.  Marland Kitchen torsemide (DEMADEX) 20 MG tablet Take 2 tablets (40 mg total) by mouth daily. (Patient taking differently: Take 20 mg by mouth 2 (two) times daily. )   No facility-administered encounter medications on file as of 10/30/2019.     Review of Systems  Constitutional: Negative for chills  and fever.  Eyes: Negative for visual disturbance.  Respiratory: Negative for shortness of breath and wheezing.   Cardiovascular: Negative for chest pain and leg swelling.  Musculoskeletal: Negative for back pain and gait problem.  Skin: Negative for rash.  Neurological: Negative for dizziness, weakness and numbness.  All other systems reviewed and are negative.   Observations/Objective: Patient sounds comfortable and in no acute distress  Assessment and Plan: Problem  List Items Addressed This Visit      Cardiovascular and Mediastinum   Hypertension associated with diabetes ([place])     Endocrine   Hyperlipidemia associated with type 2 diabetes mellitus ([place]) - Primary   Controlled diabetes mellitus with stage 3 chronic kidney disease (HCC)     Genitourinary   Stage 3 chronic kidney disease       Follow Up Instructions: Follow-up in 3 months for diabetes    I discussed the assessment and treatment plan with the patient. The patient was provided an opportunity to ask questions and all were answered. The patient agreed with the plan and demonstrated an understanding of the instructions.   The patient was advised to call back or seek an in-person evaluation if the symptoms worsen or if the condition fails to improve as anticipated.  The above assessment and management plan was discussed with the patient. The patient verbalized understanding of and has agreed to the management plan. Patient is aware to call the clinic if symptoms persist or worsen. Patient is aware when to return to the clinic for a follow-up visit. Patient educated on when it is appropriate to go to the emergency department.    I provided 6 minutes of non-face-to-face time during this encounter.    Worthy Rancher, MD

## 2019-10-31 DIAGNOSIS — N1831 Chronic kidney disease, stage 3a: Secondary | ICD-10-CM | POA: Diagnosis not present

## 2019-10-31 DIAGNOSIS — E211 Secondary hyperparathyroidism, not elsewhere classified: Secondary | ICD-10-CM | POA: Diagnosis not present

## 2019-10-31 DIAGNOSIS — D638 Anemia in other chronic diseases classified elsewhere: Secondary | ICD-10-CM | POA: Diagnosis not present

## 2019-10-31 DIAGNOSIS — I5042 Chronic combined systolic (congestive) and diastolic (congestive) heart failure: Secondary | ICD-10-CM | POA: Diagnosis not present

## 2019-10-31 DIAGNOSIS — I129 Hypertensive chronic kidney disease with stage 1 through stage 4 chronic kidney disease, or unspecified chronic kidney disease: Secondary | ICD-10-CM | POA: Diagnosis not present

## 2019-11-01 NOTE — Progress Notes (Signed)
Remote ICD transmission.   

## 2019-12-01 ENCOUNTER — Other Ambulatory Visit: Payer: Self-pay | Admitting: Cardiology

## 2019-12-14 ENCOUNTER — Other Ambulatory Visit: Payer: Self-pay | Admitting: Family Medicine

## 2020-01-02 ENCOUNTER — Other Ambulatory Visit: Payer: Self-pay | Admitting: Family Medicine

## 2020-01-02 DIAGNOSIS — D631 Anemia in chronic kidney disease: Secondary | ICD-10-CM

## 2020-01-02 DIAGNOSIS — N183 Chronic kidney disease, stage 3 unspecified: Secondary | ICD-10-CM

## 2020-01-05 ENCOUNTER — Ambulatory Visit (INDEPENDENT_AMBULATORY_CARE_PROVIDER_SITE_OTHER): Payer: Medicare HMO | Admitting: *Deleted

## 2020-01-05 DIAGNOSIS — I5022 Chronic systolic (congestive) heart failure: Secondary | ICD-10-CM

## 2020-01-05 LAB — CUP PACEART REMOTE DEVICE CHECK
Battery Remaining Longevity: 30 mo
Battery Remaining Percentage: 25 %
Battery Voltage: 2.81 V
Brady Statistic RV Percent Paced: 1 %
Date Time Interrogation Session: 20210208041040
HighPow Impedance: 50 Ohm
Implantable Lead Implant Date: 20110209
Implantable Lead Location: 753860
Implantable Lead Model: 7120
Implantable Pulse Generator Implant Date: 20110209
Lead Channel Impedance Value: 450 Ohm
Lead Channel Pacing Threshold Amplitude: 0.75 V
Lead Channel Pacing Threshold Pulse Width: 0.4 ms
Lead Channel Sensing Intrinsic Amplitude: 12 mV
Lead Channel Setting Pacing Amplitude: 2.5 V
Lead Channel Setting Pacing Pulse Width: 0.4 ms
Lead Channel Setting Sensing Sensitivity: 0.5 mV
Pulse Gen Serial Number: 747960

## 2020-01-06 NOTE — Progress Notes (Signed)
ICD Remote  

## 2020-01-29 ENCOUNTER — Other Ambulatory Visit: Payer: Self-pay | Admitting: Family Medicine

## 2020-02-04 ENCOUNTER — Other Ambulatory Visit: Payer: Self-pay

## 2020-02-04 ENCOUNTER — Other Ambulatory Visit: Payer: Medicare HMO

## 2020-02-04 DIAGNOSIS — E1122 Type 2 diabetes mellitus with diabetic chronic kidney disease: Secondary | ICD-10-CM

## 2020-02-04 DIAGNOSIS — N183 Type 2 diabetes mellitus with diabetic chronic kidney disease: Secondary | ICD-10-CM

## 2020-02-04 DIAGNOSIS — E1159 Type 2 diabetes mellitus with other circulatory complications: Secondary | ICD-10-CM

## 2020-02-04 DIAGNOSIS — D638 Anemia in other chronic diseases classified elsewhere: Secondary | ICD-10-CM | POA: Diagnosis not present

## 2020-02-04 DIAGNOSIS — E1169 Type 2 diabetes mellitus with other specified complication: Secondary | ICD-10-CM

## 2020-02-04 DIAGNOSIS — I152 Hypertension secondary to endocrine disorders: Secondary | ICD-10-CM

## 2020-02-04 DIAGNOSIS — N1831 Chronic kidney disease, stage 3a: Secondary | ICD-10-CM | POA: Diagnosis not present

## 2020-02-04 DIAGNOSIS — E785 Hyperlipidemia, unspecified: Secondary | ICD-10-CM | POA: Diagnosis not present

## 2020-02-04 DIAGNOSIS — I5042 Chronic combined systolic (congestive) and diastolic (congestive) heart failure: Secondary | ICD-10-CM | POA: Diagnosis not present

## 2020-02-04 DIAGNOSIS — I10 Essential (primary) hypertension: Secondary | ICD-10-CM | POA: Diagnosis not present

## 2020-02-04 DIAGNOSIS — I129 Hypertensive chronic kidney disease with stage 1 through stage 4 chronic kidney disease, or unspecified chronic kidney disease: Secondary | ICD-10-CM | POA: Diagnosis not present

## 2020-02-04 DIAGNOSIS — E211 Secondary hyperparathyroidism, not elsewhere classified: Secondary | ICD-10-CM | POA: Diagnosis not present

## 2020-02-04 LAB — BAYER DCA HB A1C WAIVED: HB A1C (BAYER DCA - WAIVED): 5.9 % (ref <7.0)

## 2020-02-05 LAB — LIPID PANEL
Chol/HDL Ratio: 3.1 ratio (ref 0.0–5.0)
Cholesterol, Total: 129 mg/dL (ref 100–199)
HDL: 41 mg/dL (ref >39)
LDL Chol Calc (NIH): 70 mg/dL (ref 0–99)
Triglycerides: 92 mg/dL (ref 0–149)
VLDL Cholesterol Cal: 18 mg/dL (ref 5–40)

## 2020-02-05 LAB — CMP14+EGFR
ALT: 21 IU/L (ref 0–44)
AST: 22 IU/L (ref 0–40)
Albumin/Globulin Ratio: 1.8 (ref 1.2–2.2)
Albumin: 4.4 g/dL (ref 3.8–4.8)
Alkaline Phosphatase: 160 IU/L — ABNORMAL HIGH (ref 39–117)
BUN/Creatinine Ratio: 15 (ref 10–24)
BUN: 22 mg/dL (ref 8–27)
Bilirubin Total: 0.4 mg/dL (ref 0.0–1.2)
CO2: 22 mmol/L (ref 20–29)
Calcium: 9 mg/dL (ref 8.6–10.2)
Chloride: 102 mmol/L (ref 96–106)
Creatinine, Ser: 1.45 mg/dL — ABNORMAL HIGH (ref 0.76–1.27)
GFR calc Af Amer: 58 mL/min/{1.73_m2} — ABNORMAL LOW (ref >59)
GFR calc non Af Amer: 50 mL/min/{1.73_m2} — ABNORMAL LOW (ref >59)
Globulin, Total: 2.4 g/dL (ref 1.5–4.5)
Glucose: 95 mg/dL (ref 65–99)
Potassium: 4.8 mmol/L (ref 3.5–5.2)
Sodium: 138 mmol/L (ref 134–144)
Total Protein: 6.8 g/dL (ref 6.0–8.5)

## 2020-02-05 LAB — CBC WITH DIFFERENTIAL/PLATELET
Basophils Absolute: 0 10*3/uL (ref 0.0–0.2)
Basos: 0 %
EOS (ABSOLUTE): 0.1 10*3/uL (ref 0.0–0.4)
Eos: 1 %
Hematocrit: 43.8 % (ref 37.5–51.0)
Hemoglobin: 14.9 g/dL (ref 13.0–17.7)
Immature Grans (Abs): 0 10*3/uL (ref 0.0–0.1)
Immature Granulocytes: 0 %
Lymphocytes Absolute: 1.5 10*3/uL (ref 0.7–3.1)
Lymphs: 15 %
MCH: 30 pg (ref 26.6–33.0)
MCHC: 34 g/dL (ref 31.5–35.7)
MCV: 88 fL (ref 79–97)
Monocytes Absolute: 0.6 10*3/uL (ref 0.1–0.9)
Monocytes: 6 %
Neutrophils Absolute: 8.2 10*3/uL — ABNORMAL HIGH (ref 1.4–7.0)
Neutrophils: 78 %
Platelets: 259 10*3/uL (ref 150–450)
RBC: 4.96 x10E6/uL (ref 4.14–5.80)
RDW: 12.8 % (ref 11.6–15.4)
WBC: 10.5 10*3/uL (ref 3.4–10.8)

## 2020-02-06 ENCOUNTER — Ambulatory Visit (INDEPENDENT_AMBULATORY_CARE_PROVIDER_SITE_OTHER): Payer: Medicare HMO | Admitting: Family Medicine

## 2020-02-06 DIAGNOSIS — Z029 Encounter for administrative examinations, unspecified: Secondary | ICD-10-CM

## 2020-02-06 NOTE — Progress Notes (Signed)
Attempted to call the patient with the number that was in the notes and it is not a valid number, attempted to call the number on his files, the 2 different numbers and there was no answer.  Did not go to voicemail, just cannot ringing Arville Care, MD [facility] 02/06/2020, 10:11 AM

## 2020-02-13 DIAGNOSIS — D638 Anemia in other chronic diseases classified elsewhere: Secondary | ICD-10-CM | POA: Diagnosis not present

## 2020-02-13 DIAGNOSIS — Z79899 Other long term (current) drug therapy: Secondary | ICD-10-CM | POA: Diagnosis not present

## 2020-02-13 DIAGNOSIS — E211 Secondary hyperparathyroidism, not elsewhere classified: Secondary | ICD-10-CM | POA: Diagnosis not present

## 2020-02-13 DIAGNOSIS — I5042 Chronic combined systolic (congestive) and diastolic (congestive) heart failure: Secondary | ICD-10-CM | POA: Diagnosis not present

## 2020-02-13 DIAGNOSIS — I129 Hypertensive chronic kidney disease with stage 1 through stage 4 chronic kidney disease, or unspecified chronic kidney disease: Secondary | ICD-10-CM | POA: Diagnosis not present

## 2020-02-13 DIAGNOSIS — N1831 Chronic kidney disease, stage 3a: Secondary | ICD-10-CM | POA: Diagnosis not present

## 2020-02-16 ENCOUNTER — Ambulatory Visit (INDEPENDENT_AMBULATORY_CARE_PROVIDER_SITE_OTHER): Payer: Medicare HMO | Admitting: Family Medicine

## 2020-02-16 ENCOUNTER — Encounter: Payer: Self-pay | Admitting: Family Medicine

## 2020-02-16 DIAGNOSIS — K219 Gastro-esophageal reflux disease without esophagitis: Secondary | ICD-10-CM | POA: Diagnosis not present

## 2020-02-16 DIAGNOSIS — E785 Hyperlipidemia, unspecified: Secondary | ICD-10-CM | POA: Diagnosis not present

## 2020-02-16 DIAGNOSIS — E1159 Type 2 diabetes mellitus with other circulatory complications: Secondary | ICD-10-CM | POA: Diagnosis not present

## 2020-02-16 DIAGNOSIS — E1169 Type 2 diabetes mellitus with other specified complication: Secondary | ICD-10-CM | POA: Diagnosis not present

## 2020-02-16 DIAGNOSIS — E1122 Type 2 diabetes mellitus with diabetic chronic kidney disease: Secondary | ICD-10-CM

## 2020-02-16 DIAGNOSIS — N183 Chronic kidney disease, stage 3 unspecified: Secondary | ICD-10-CM | POA: Diagnosis not present

## 2020-02-16 DIAGNOSIS — I10 Essential (primary) hypertension: Secondary | ICD-10-CM | POA: Diagnosis not present

## 2020-02-16 DIAGNOSIS — N1831 Chronic kidney disease, stage 3a: Secondary | ICD-10-CM | POA: Diagnosis not present

## 2020-02-16 DIAGNOSIS — I152 Hypertension secondary to endocrine disorders: Secondary | ICD-10-CM

## 2020-02-16 NOTE — Progress Notes (Signed)
Virtual Visit via telephone Note  I connected with Joseph [name] on 02/16/20 at 0815 by telephone and verified that I am speaking with the correct person using two identifiers. Joseph [name] is currently located at home and no other people are currently with her during visit. The provider, Elige Radon Marquese Burkland, MD is located in their office at time of visit.  Call ended at [phone]  I discussed the limitations, risks, security and privacy concerns of performing an evaluation and management service by telephone and the availability of in person appointments. I also discussed with the patient that there may be a patient responsible charge related to this service. The patient expressed understanding and agreed to proceed.  O2 100, Hr 71 History and Present Illness: Hyperlipidemia Patient is coming in for recheck of his hyperlipidemia. The patient is currently taking atorvastatin and fish oils. They deny any issues with myalgias or history of liver damage from it. They deny any focal numbness or weakness or chest pain.   Hypertension Patient is currently on metoprolol and enalapril, and their blood pressure today is 136/68. Patient denies any lightheadedness or dizziness. Patient denies headaches, blurred vision, chest pains, shortness of breath, or weakness. Denies any side effects from medication and is content with current medication.   GERD Patient is currently on omeprazole.  She denies any major symptoms or abdominal pain or belching or burping. She denies any blood in her stool or lightheadedness or dizziness.     Outpatient Encounter Medications as of 02/16/2020  Medication Sig  . acetaminophen (TYLENOL) 500 MG tablet Take 500 mg by mouth every 6 (six) hours as needed for pain.  Marland Kitchen albuterol (PROVENTIL HFA;VENTOLIN HFA) 108 (90 Base) MCG/ACT inhaler Inhale 2 puffs into the lungs every 4 (four) hours as needed.  Marland Kitchen aspirin 81 MG tablet Take 81 mg by mouth at bedtime.   Marland Kitchen atorvastatin  (LIPITOR) 80 MG tablet TAKE 1 TABLET BY MOUTH EVERY DAY IN THE EVENING  . enalapril (VASOTEC) 20 MG tablet Take 1 tablet (20 mg total) by mouth 2 (two) times daily.  . ferrous sulfate 325 (65 FE) MG tablet TAKE 1 TABLET BY MOUTH EVERY DAY WITH BREAKFAST  . fish oil-omega-3 fatty acids 1000 MG capsule Take 1 g by mouth 3 (three) times daily.   Marland Kitchen guaiFENesin (MUCINEX PO) Take 1 tablet by mouth as needed.   . metoprolol tartrate (LOPRESSOR) 50 MG tablet TAKE 1 TABLET BY MOUTH THREE TIMES A DAY  . Multiple Vitamin (MULTIVITAMIN) tablet Take 1 tablet by mouth daily. With FeSo4  . nitroGLYCERIN (NITROSTAT) 0.4 MG SL tablet Place 1 tablet (0.4 mg total) under the tongue every 5 (five) minutes as needed for chest pain.  Marland Kitchen omeprazole (PRILOSEC) 20 MG capsule TAKE 1 CAPSULE BY MOUTH EVERY DAY  . potassium chloride SA (KLOR-CON M20) 20 MEQ tablet Take 1 tablet (20 mEq total) by mouth daily.  Marland Kitchen torsemide (DEMADEX) 20 MG tablet TAKE 2 TABLETS BY MOUTH EVERY DAY   No facility-administered encounter medications on file as of 02/16/2020.    Review of Systems  Constitutional: Negative for chills and fever.  Eyes: Negative for visual disturbance.  Respiratory: Negative for shortness of breath and wheezing.   Cardiovascular: Negative for chest pain and leg swelling.  Musculoskeletal: Negative for back pain and gait problem.  Skin: Negative for rash.  Neurological: Negative for dizziness, weakness and light-headedness.  Psychiatric/Behavioral: Negative for decreased concentration and dysphoric mood. The patient is not nervous/anxious.  All other systems reviewed and are negative.   Observations/Objective: Patient sounds comfortable and in no acute distress  Assessment and Plan: Problem List Items Addressed This Visit      Cardiovascular and Mediastinum   Hypertension associated with diabetes ([place]) - Primary     Digestive   Acid reflux     Endocrine   Hyperlipidemia associated with type 2 diabetes  mellitus (HCC)   Controlled diabetes mellitus with stage 3 chronic kidney disease (HCC)     Genitourinary   Stage 3 chronic kidney disease      Patient sounds comfortable and in no acute distress, his blood work he had done just a couple weeks ago looks great and no changes warranted at this point, continue to follow with his nephrologist Follow up plan: Return in about 6 months (around 08/18/2020), or if symptoms worsen or fail to improve, for htn and hld .     I discussed the assessment and treatment plan with the patient. The patient was provided an opportunity to ask questions and all were answered. The patient agreed with the plan and demonstrated an understanding of the instructions.   The patient was advised to call back or seek an in-person evaluation if the symptoms worsen or if the condition fails to improve as anticipated.  The above assessment and management plan was discussed with the patient. The patient verbalized understanding of and has agreed to the management plan. Patient is aware to call the clinic if symptoms persist or worsen. Patient is aware when to return to the clinic for a follow-up visit. Patient educated on when it is appropriate to go to the emergency department.    I provided 8 minutes of non-face-to-face time during this encounter.    Worthy Rancher, MD

## 2020-02-21 ENCOUNTER — Other Ambulatory Visit: Payer: Self-pay | Admitting: Family Medicine

## 2020-02-23 ENCOUNTER — Ambulatory Visit (INDEPENDENT_AMBULATORY_CARE_PROVIDER_SITE_OTHER): Payer: Medicare HMO | Admitting: *Deleted

## 2020-02-23 DIAGNOSIS — Z0000 Encounter for general adult medical examination without abnormal findings: Secondary | ICD-10-CM | POA: Diagnosis not present

## 2020-02-23 NOTE — Progress Notes (Signed)
...     MEDICARE ANNUAL WELLNESS VISIT  02/23/2020  Telephone Visit Disclaimer This Medicare AWV was conducted by telephone due to national recommendations for restrictions regarding the COVID-19 Pandemic (e.g. social distancing).  I verified, using two identifiers, that I am speaking with Joseph [name] or their authorized healthcare agent. I discussed the limitations, risks, security, and privacy concerns of performing an evaluation and management service by telephone and the potential availability of an in-person appointment in the future. The patient expressed understanding and agreed to proceed.   Subjective:  Joseph [name] is a 63 y.o. male patient of [name], Elige Radon, MD who had a Medicare Annual Wellness Visit today via telephone. Joseph [name] is Disabled and lives with their spouse. he has 2 living  children. he reports that he is socially active and does interact with friends/family regularly. he is minimally physically active and enjoys [employer] and gardening  Patient Care Team: [name], Elige Radon, MD as PCP - General (Family Medicine) Rollene Rotunda, MD as PCP - Cardiology (Cardiology) Marinus Maw, MD as PCP - Electrophysiology (Cardiology)  Advanced Directives 02/23/2020 10/28/2017 04/21/2017 03/27/2016 12/31/2015 01/16/2015 01/15/2015  Does Patient Have a Medical Advance Directive? No No No No No No No  Would patient like information on creating a medical advance directive? Yes (MAU/Ambulatory/Procedural Areas - Information given) No - Patient declined - - No - patient declined information No - patient declined information No - patient declined information    Hospital Utilization Over the Past 12 Months: # of hospitalizations or ER visits: 0 # of surgeries: 0  Review of Systems    Patient reports that his overall health is unchanged compared to last year.  History obtained from the patient  Patient Reported Readings (BP, Pulse, CBG, Weight, etc) none  Pain  Assessment Pain : No/denies pain     Current Medications & Allergies (verified) Allergies as of 02/23/2020   No Known Allergies     Medication List       Accurate as of February 23, 2020  9:10 AM. If you have any questions, ask your nurse or doctor.        acetaminophen 500 MG tablet Commonly known as: TYLENOL Take 500 mg by mouth every 6 (six) hours as needed for pain.   albuterol 108 (90 Base) MCG/ACT inhaler Commonly known as: VENTOLIN HFA Inhale 2 puffs into the lungs every 4 (four) hours as needed.   aspirin 81 MG tablet Take 81 mg by mouth at bedtime.   atorvastatin 80 MG tablet Commonly known as: LIPITOR TAKE 1 TABLET BY MOUTH EVERY DAY IN THE EVENING   enalapril 20 MG tablet Commonly known as: VASOTEC Take 1 tablet (20 mg total) by mouth 2 (two) times daily.   ferrous sulfate 325 (65 FE) MG tablet TAKE 1 TABLET BY MOUTH EVERY DAY WITH BREAKFAST   fish oil-omega-3 fatty acids 1000 MG capsule Take 1 g by mouth 3 (three) times daily.   metoprolol tartrate 50 MG tablet Commonly known as: LOPRESSOR TAKE 1 TABLET BY MOUTH THREE TIMES A DAY   MUCINEX PO Take 1 tablet by mouth as needed.   multivitamin tablet Take 1 tablet by mouth daily. With FeSo4   nitroGLYCERIN 0.4 MG SL tablet Commonly known as: NITROSTAT Place 1 tablet (0.4 mg total) under the tongue every 5 (five) minutes as needed for chest pain.   omeprazole 20 MG capsule Commonly known as: PRILOSEC TAKE 1 CAPSULE BY MOUTH EVERY DAY  potassium chloride SA 20 MEQ tablet Commonly known as: Klor-Con M20 Take 1 tablet (20 mEq total) by mouth daily.   torsemide 20 MG tablet Commonly known as: DEMADEX TAKE 2 TABLETS BY MOUTH EVERY DAY       History (reviewed): Past Medical History:  Diagnosis Date  . CHF (congestive heart failure) (HCC)    a. EF 25-30% in 2016 b. 35-40% by echo in 10/2017  . COPD (chronic obstructive pulmonary disease) (HCC)   . Coronary artery disease   . GI bleed     Mallory-Weiss tear EGD 02/2010  . Hyperlipidemia   . Hypertension   . Ileus (HCC) 2006   required hospitalization  . Left ventricular dysfunction    apical thrombus  . MRSA bacteremia   . 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 Jude  . Small bowel obstruction (HCC) 2007   resolved without surgery   Past Surgical History:  Procedure Laterality Date  . basilic vein left arm resection    . CARDIAC DEFIBRILLATOR PLACEMENT    . CORONARY ARTERY BYPASS GRAFT     6 vessels, 2000  . INSERT / REPLACE / REMOVE PACEMAKER  01/05/10   ICD insertion/St. Jude single chamber   Family History  Problem Relation Age of Onset  . Heart attack Mother   . Emphysema Father   . Coronary artery disease Father        s/p cabg  . Colon cancer Neg Hx    Social History   Socioeconomic History  . Marital status: Married    Spouse name: Kathie Rhodes  . Number of children: 3  . Years of education: GED  . Highest education level: Not on file  Occupational History  . 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 Smoking status: Former Smoker    Types: Cigarettes    Start date: 11/28/1967    Quit date: 02/29/1988    Years since quitting: 32.0  . Smokeless tobacco: Never Used  Substance and Sexual Activity  . Alcohol use: No    Alcohol/week: 0.0 standard drinks  . Drug use: No  . Sexual activity: Never  Other Topics Concern  . Not on file  Social History Narrative   1 son died age 1-Lee's syndrome         Social Determinants of Health   Financial Resource Strain: Low Risk   . Difficulty of Paying Living Expenses: Not very hard  Food Insecurity: No Food Insecurity  . Worried About Programme researcher, broadcasting/film/video in the Last Year: Never true  . Ran Out of Food in the Last Year: Never true  Transportation Needs: No Transportation Needs  . Lack of Transportation (Medical): No  . Lack of Transportation (Non-Medical): No  Physical Activity:  Insufficiently Active  . Days of Exercise per Week: 3 days  . Minutes of Exercise per Session: 30 min  Stress: No Stress Concern Present  . Feeling of Stress : Only a little  Social Connections: Not Isolated  . Frequency of Communication with Friends and Family: More than three times a week  . Frequency of Social Gatherings with Friends and Family: More than three times a week  . Attends Religious Services: More than 4 times per year  . Active Member of Clubs or Organizations: Yes  . Attends Banker Meetings: More than 4 times per year  . Marital Status: Married    Activities of Daily Living In your present  state of health, do you have any difficulty performing the following activities: 02/23/2020  Hearing? N  Vision? Y  Comment wears glasses  Difficulty concentrating or making decisions? N  Walking or climbing stairs? N  Dressing or bathing? N  Doing errands, shopping? N  Preparing Food and eating ? N  Using the Toilet? N  In the past six months, have you accidently leaked urine? N  Do you have problems with loss of bowel control? N  Managing your Medications? N  Managing your Finances? N  Housekeeping or managing your Housekeeping? N  Some recent data might be hidden    Patient Education/ Literacy How often do you need to have someone help you when you read instructions, pamphlets, or other written materials from your doctor or pharmacy?: 1 - Never What is the last grade level you completed in school?: GED  Exercise Current Exercise Habits: Home exercise routine, Type of exercise: walking, Time (Minutes): 30, Frequency (Times/Week): 4, Weekly Exercise (Minutes/Week): 120, Intensity: Mild, Exercise limited by: None identified  Diet Patient reports consuming 2 meals a day and 0 snack(s) a day Patient reports that his primary diet is: Regular Patient reports that she does have regular access to food.   Depression Screen PHQ 2/9 Scores 02/23/2020 01/06/2019  12/11/2018 08/20/2018 07/05/2018  PHQ - 2 Score 0 0 0 0 0     Fall Risk Fall Risk  02/23/2020 01/06/2019 12/11/2018 08/20/2018 07/05/2018  Falls in the past year? 0 0 0 No No     Objective:  PRAJWAL FELLNER seemed alert and oriented and he participated appropriately during our telephone visit.  Blood Pressure Weight BMI  BP Readings from Last 3 Encounters:  08/13/19 138/78  07/31/19 (!) 146/79  04/08/19 130/72   Wt Readings from Last 3 Encounters:  08/13/19 193 lb (87.5 kg)  07/31/19 197 lb 6.4 oz (89.5 kg)  04/08/19 190 lb (86.2 kg)   BMI Readings from Last 1 Encounters:  08/13/19 26.92 kg/m    *Unable to obtain current vital signs, weight, and BMI due to telephone visit type  Hearing/Vision  . Soua did not seem to have difficulty with hearing/understanding during the telephone conversation . Reports that he has not had a formal eye exam by an eye care professional within the past year . Reports that he has not had a formal hearing evaluation within the past year *Unable to fully assess hearing and vision during telephone visit type  Cognitive Function: 6CIT Screen 02/23/2020 02/23/2020  What Year? 0 points 0 points  What month? 0 points 0 points  What time? 0 points 0 points  Count back from 20 0 points 0 points  Months in reverse 0 points -  Repeat phrase 0 points -  Total Score 0 -   (Normal:0-7, Significant for Dysfunction: >8)  Normal Cognitive Function Screening: Yes   Immunization & Health Maintenance Record Immunization History  Administered Date(s) Administered  . Influenza, High Dose Seasonal PF 11/07/2017  . Influenza,inj,Quad PF,6+ Mos 11/07/2016, 10/14/2018  . Pneumococcal Polysaccharide-23 02/03/2015  . Td 11/27/2008    Health Maintenance  Topic Date Due  . Hepatitis C Screening  Never done  . OPHTHALMOLOGY EXAM  Never done  . COLONOSCOPY  Never done  . TETANUS/TDAP  11/27/2018  . PNA vac Low Risk Adult (1 of 2 - PCV13) 12/01/2018  . INFLUENZA VACCINE   06/28/2019  . FOOT EXAM  01/07/2020  . HEMOGLOBIN A1C  08/06/2020       Assessment  This is a routine wellness examination for KUZEY OGATA.  Health Maintenance: Due or Overdue Health Maintenance Due  Topic Date Due  . Hepatitis C Screening  Never done  . OPHTHALMOLOGY EXAM  Never done  . COLONOSCOPY  Never done  . TETANUS/TDAP  11/27/2018  . PNA vac Low Risk Adult (1 of 2 - PCV13) 12/01/2018  . INFLUENZA VACCINE  06/28/2019  . FOOT EXAM  01/07/2020    Alysia Penna does not need a referral for Community Assistance: Care Management:   no Social Work:    no Prescription Assistance:  no Nutrition/Diabetes Education:  no   Plan:  Personalized Goals Goals Addressed            This Visit's Progress   . Patient Stated       Stay active and continue to work part time.    . Patient Stated       Maintain current weight throughout the coming year.      Personalized Health Maintenance & Screening Recommendations  Tdap Prevnar Advanced Directives Hep C  Lung Cancer Screening Recommended: no (Low Dose CT Chest recommended if Age 108-80 years, 30 pack-year currently smoking OR have quit w/in past 15 years) Hepatitis C Screening recommended: yes HIV Screening recommended: no  Advanced Directives: Written information was prepared per patient's request.  Referrals & Orders No orders of the defined types were placed in this encounter.   Follow-up Plan . Follow-up with [name], [name], MD as planned on 05/26/21 for in person follow up. . Pt is scheduling his yearly eye exam this week. . Advanced Directives were placed up front for pt to look over. . Pt is still working part time with the [employer]. His goals are to continue to stay active an maintain his weight. . He will have his tdap and prevnar on his next visit. .    I have personally reviewed and noted the following in the patient's chart:   . Medical and social history . Use of alcohol, tobacco or  illicit drugs  . Current medications and supplements . Functional ability and status . Nutritional status . Physical activity . Advanced directives . List of other physicians . Hospitalizations, surgeries, and ER visits in previous 12 months . Vitals . Screenings to include cognitive, depression, and falls . Referrals and appointments  In addition, I have reviewed and discussed with Alysia Penna certain preventive protocols, quality metrics, and best practice recommendations. A written personalized care plan for preventive services as well as general preventive health recommendations is available and can be mailed to the patient at his request.      Faylene Million Charmaine,LPN  [id number]

## 2020-03-13 ENCOUNTER — Other Ambulatory Visit: Payer: Self-pay | Admitting: Cardiology

## 2020-03-14 ENCOUNTER — Other Ambulatory Visit: Payer: Self-pay | Admitting: Family Medicine

## 2020-03-20 ENCOUNTER — Other Ambulatory Visit: Payer: Self-pay | Admitting: Family Medicine

## 2020-04-05 ENCOUNTER — Ambulatory Visit (INDEPENDENT_AMBULATORY_CARE_PROVIDER_SITE_OTHER): Payer: Medicare HMO | Admitting: *Deleted

## 2020-04-05 DIAGNOSIS — I255 Ischemic cardiomyopathy: Secondary | ICD-10-CM

## 2020-04-05 LAB — CUP PACEART REMOTE DEVICE CHECK
Battery Remaining Longevity: 26 mo
Battery Remaining Percentage: 22 %
Battery Voltage: 2.78 V
Brady Statistic RV Percent Paced: 1 %
Date Time Interrogation Session: 20210510053456
HighPow Impedance: 48 Ohm
Implantable Lead Implant Date: 20110209
Implantable Lead Location: 753860
Implantable Lead Model: 7120
Implantable Pulse Generator Implant Date: 20110209
Lead Channel Impedance Value: 490 Ohm
Lead Channel Pacing Threshold Amplitude: 0.75 V
Lead Channel Pacing Threshold Pulse Width: 0.4 ms
Lead Channel Sensing Intrinsic Amplitude: 12 mV
Lead Channel Setting Pacing Amplitude: 2.5 V
Lead Channel Setting Pacing Pulse Width: 0.4 ms
Lead Channel Setting Sensing Sensitivity: 0.5 mV
Pulse Gen Serial Number: 747960

## 2020-04-05 NOTE — Progress Notes (Signed)
Remote ICD transmission.   

## 2020-04-06 DIAGNOSIS — Z7189 Other specified counseling: Secondary | ICD-10-CM | POA: Insufficient documentation

## 2020-04-06 NOTE — Progress Notes (Signed)
Cardiology Office Note   Date:  04/07/2020   ID:  Joseph [name] 02-15-1956, MRN [id number]  PCP:  [name], Elige Radon, MD  Cardiologist:   Joseph Rotunda, MD   Chief Complaint  Patient presents with  . Cardiomyopathy      History of Present Illness: Joseph [name] is a 64 y.o. male who presents for followup of coronary disease and cardiomyopathy.  Since I last saw him he has done very well.  Walking every other day.  He works in Architect. The patient denies any new symptoms such as chest discomfort, neck or arm discomfort. There has been no new shortness of breath, PND or orthopnea. There have been no reported palpitations, presyncope or syncope.     Past Medical History:  Diagnosis Date  . CHF (congestive heart failure) (HCC)    a. EF 25-30% in 2016 b. 35-40% by echo in 10/2017  . COPD (chronic obstructive pulmonary disease) (HCC)   . Coronary artery disease   . GI bleed    Mallory-Weiss tear EGD 02/2010  . Hyperlipidemia   . Hypertension   . Ileus (HCC) 2006   required hospitalization  . Left ventricular dysfunction    apical thrombus  . MRSA bacteremia   . 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 Jude  . Small bowel obstruction (HCC) 2007   resolved without surgery    Past Surgical History:  Procedure Laterality Date  . basilic vein left arm resection    . CARDIAC DEFIBRILLATOR PLACEMENT    . CORONARY ARTERY BYPASS GRAFT     6 vessels, 2000  . INSERT / REPLACE / REMOVE PACEMAKER  01/05/10   ICD insertion/St. Jude single chamber     Current Outpatient Medications  Medication Sig Dispense Refill  . acetaminophen (TYLENOL) 500 MG tablet Take 500 mg by mouth every 6 (six) hours as needed for pain.    Marland Kitchen albuterol (PROVENTIL HFA;VENTOLIN HFA) 108 (90 Base) MCG/ACT inhaler Inhale 2 puffs into the lungs every 4 (four) hours as needed. 6.7 g 0  . aspirin 81 MG tablet Take 81 mg by mouth at bedtime.     Marland Kitchen  atorvastatin (LIPITOR) 80 MG tablet TAKE 1 TABLET BY MOUTH EVERY DAY IN THE EVENING 90 tablet 1  . enalapril (VASOTEC) 20 MG tablet Take 1 tablet (20 mg total) by mouth 2 (two) times daily. 180 tablet 3  . ferrous sulfate 325 (65 FE) MG tablet TAKE 1 TABLET BY MOUTH EVERY DAY WITH BREAKFAST 90 tablet 0  . fish oil-omega-3 fatty acids 1000 MG capsule Take 1 g by mouth 3 (three) times daily.     Marland Kitchen guaiFENesin (MUCINEX PO) Take 1 tablet by mouth as needed.     . metoprolol tartrate (LOPRESSOR) 50 MG tablet TAKE 1 TABLET BY MOUTH THREE TIMES A DAY 270 tablet 1  . Multiple Vitamin (MULTIVITAMIN) tablet Take 1 tablet by mouth daily. With FeSo4    . nitroGLYCERIN (NITROSTAT) 0.4 MG SL tablet Place 1 tablet (0.4 mg total) under the tongue every 5 (five) minutes as needed for chest pain. 25 tablet 3  . omeprazole (PRILOSEC) 20 MG capsule TAKE 1 CAPSULE BY MOUTH EVERY DAY 90 capsule 0  . potassium chloride SA (KLOR-CON M20) 20 MEQ tablet Take 1 tablet (20 mEq total) by mouth daily. 90 tablet 3  . torsemide (DEMADEX) 20 MG tablet TAKE 2 TABLETS BY MOUTH EVERY DAY 180 tablet 0   No  current facility-administered medications for this visit.    Allergies:   Patient has no known allergies.   ROS:  Please see the history of present illness.   Otherwise, review of systems are positive for none.   All other systems are reviewed and negative.    PHYSICAL EXAM: VS:  BP 122/74   Pulse 64   Ht 5\' 11"  (1.803 m)   Wt 189 lb (85.7 kg)   BMI 26.36 kg/m  , BMI Body mass index is 26.36 kg/m. GENERAL:  Well appearing NECK:  No jugular venous distention, waveform within normal limits, carotid upstroke brisk and symmetric, no bruits, no thyromegaly LUNGS:  Clear to auscultation bilaterally CHEST:  Well healed ICD pocket.  HEART:  PMI not displaced or sustained,S1 and S2 within normal limits, no S3, no S4, no clicks, no rubs, soft brief apical systolic murmur nonradiating, no diastolic murmurs ABD:  Flat, positive  bowel sounds normal in frequency in pitch, no bruits, no rebound, no guarding, no midline pulsatile mass, no hepatomegaly, no splenomegaly EXT:  2 plus pulses throughout, no edema, no cyanosis no clubbing   EKG:  EKG is not ordered today.   Recent Labs: 02/04/2020: ALT 21; BUN 22; Creatinine, Ser 1.45; Hemoglobin 14.9; Platelets 259; Potassium 4.8; Sodium 138    Lipid Panel    Component Value Date/Time   CHOL 129 02/04/2020 0930   TRIG 92 02/04/2020 0930   HDL 41 02/04/2020 0930   CHOLHDL 3.1 02/04/2020 0930   CHOLHDL 3.2 03/08/2010 0411   VLDL 10 03/08/2010 0411   LDLCALC 70 02/04/2020 0930      Wt Readings from Last 3 Encounters:  04/07/20 189 lb (85.7 kg)  08/13/19 193 lb (87.5 kg)  07/31/19 197 lb 6.4 oz (89.5 kg)      Other studies Reviewed: Additional studies/ records that were reviewed today include: Labs. Review of the above records demonstrates:  Please see elsewhere in the note.     ASSESSMENT AND PLAN:  CAD -  The patient has no new symptoms.  No change in therapy.  Has no new sypmtoms.No change in therapy.  CARDIOMYOPATHY -  EF was 35 - 40%.      He seems to be euvolemic.  No change in therapy.   ICD - He is up-to-date with follow-up he just did a remote transmission the other day but has not results back yet.  He has had no active events that he knows about.   DYSLIPIDEMIA - His LDL was 70.  No change in therapy.   DM - His A1c was 5.9.  No change in therapy.  CKD - Creatinine was 1.45.  This is up mildly.  No change in therapy.  HTN - His blood pressure is at target.  Blood pressure will not tolerated med titration and has been symptomatic with Entresto in the past.  Therefore, no change in therapy.  COVID EDUCATION - We talked a couple of times about getting the vaccine.  Not sure he did not do this.   Current medicines are reviewed at length with the patient today.  The patient does not have concerns regarding medicines.  The  following changes have been made:  no change  Labs/ tests ordered today include: None No orders of the defined types were placed in this encounter.    Disposition:   FU with in March of next year (six months after Dr. Lovena Le sees him.)     Signed, Minus Breeding, MD  04/07/2020 9:46  AM    [place]

## 2020-04-07 ENCOUNTER — Ambulatory Visit: Payer: Medicare HMO | Admitting: Cardiology

## 2020-04-07 ENCOUNTER — Other Ambulatory Visit: Payer: Self-pay

## 2020-04-07 ENCOUNTER — Encounter: Payer: Self-pay | Admitting: Cardiology

## 2020-04-07 VITALS — BP 122/74 | HR 64 | Ht 71.0 in | Wt 189.0 lb

## 2020-04-07 DIAGNOSIS — Z95810 Presence of automatic (implantable) cardiac defibrillator: Secondary | ICD-10-CM

## 2020-04-07 DIAGNOSIS — I10 Essential (primary) hypertension: Secondary | ICD-10-CM | POA: Diagnosis not present

## 2020-04-07 DIAGNOSIS — N1831 Chronic kidney disease, stage 3a: Secondary | ICD-10-CM | POA: Diagnosis not present

## 2020-04-07 DIAGNOSIS — I2510 Atherosclerotic heart disease of native coronary artery without angina pectoris: Secondary | ICD-10-CM | POA: Diagnosis not present

## 2020-04-07 DIAGNOSIS — E118 Type 2 diabetes mellitus with unspecified complications: Secondary | ICD-10-CM | POA: Diagnosis not present

## 2020-04-07 DIAGNOSIS — Z7189 Other specified counseling: Secondary | ICD-10-CM | POA: Diagnosis not present

## 2020-04-07 DIAGNOSIS — I255 Ischemic cardiomyopathy: Secondary | ICD-10-CM | POA: Diagnosis not present

## 2020-04-07 NOTE — Patient Instructions (Addendum)
Medication Instructions:  The current medical regimen is effective;  continue present plan and medications.  *If you need a refill on your cardiac medications before your next appointment, please call your pharmacy*  Follow-Up: At [facility], you and your health needs are our priority.  As part of our continuing mission to provide you with exceptional heart care, we have created designated Provider Care Teams.  These Care Teams include your primary Cardiologist (physician) and Advanced Practice Providers (APPs -  Physician Assistants and Nurse Practitioners) who all work together to provide you with the care you need, when you need it.  We recommend signing up for the patient portal called "MyChart".  Sign up information is provided on this After Visit Summary.  MyChart is used to connect with patients for Virtual Visits (Telemedicine).  Patients are able to view lab/test results, encounter notes, upcoming appointments, etc.  Non-urgent messages can be sent to your provider as well.   To learn more about what you can do with MyChart, go to [email or web address].    Your next appointment:   10 month(s)  The format for your next appointment:   In Person  Provider:   Rollene Rotunda, MD   Thank you for choosing [facility]!!

## 2020-05-19 ENCOUNTER — Other Ambulatory Visit: Payer: Self-pay | Admitting: *Deleted

## 2020-05-19 ENCOUNTER — Other Ambulatory Visit: Payer: Medicare HMO

## 2020-05-19 ENCOUNTER — Other Ambulatory Visit: Payer: Self-pay

## 2020-05-19 DIAGNOSIS — I10 Essential (primary) hypertension: Secondary | ICD-10-CM | POA: Diagnosis not present

## 2020-05-19 DIAGNOSIS — I129 Hypertensive chronic kidney disease with stage 1 through stage 4 chronic kidney disease, or unspecified chronic kidney disease: Secondary | ICD-10-CM | POA: Diagnosis not present

## 2020-05-19 DIAGNOSIS — E1159 Type 2 diabetes mellitus with other circulatory complications: Secondary | ICD-10-CM | POA: Diagnosis not present

## 2020-05-19 DIAGNOSIS — E1122 Type 2 diabetes mellitus with diabetic chronic kidney disease: Secondary | ICD-10-CM

## 2020-05-19 DIAGNOSIS — I152 Hypertension secondary to endocrine disorders: Secondary | ICD-10-CM

## 2020-05-19 DIAGNOSIS — N183 Chronic kidney disease, stage 3 unspecified: Secondary | ICD-10-CM | POA: Diagnosis not present

## 2020-05-19 DIAGNOSIS — I5042 Chronic combined systolic (congestive) and diastolic (congestive) heart failure: Secondary | ICD-10-CM | POA: Diagnosis not present

## 2020-05-19 DIAGNOSIS — N1831 Chronic kidney disease, stage 3a: Secondary | ICD-10-CM | POA: Diagnosis not present

## 2020-05-19 DIAGNOSIS — E211 Secondary hyperparathyroidism, not elsewhere classified: Secondary | ICD-10-CM | POA: Diagnosis not present

## 2020-05-19 DIAGNOSIS — Z79899 Other long term (current) drug therapy: Secondary | ICD-10-CM | POA: Diagnosis not present

## 2020-05-19 DIAGNOSIS — D638 Anemia in other chronic diseases classified elsewhere: Secondary | ICD-10-CM | POA: Diagnosis not present

## 2020-05-19 LAB — BAYER DCA HB A1C WAIVED: HB A1C (BAYER DCA - WAIVED): 5.9 % (ref <7.0)

## 2020-05-20 LAB — CMP14+EGFR
ALT: 22 IU/L (ref 0–44)
AST: 17 IU/L (ref 0–40)
Albumin/Globulin Ratio: 1.9 (ref 1.2–2.2)
Albumin: 4.4 g/dL (ref 3.8–4.8)
Alkaline Phosphatase: 148 IU/L — ABNORMAL HIGH (ref 48–121)
BUN/Creatinine Ratio: 14 (ref 10–24)
BUN: 24 mg/dL (ref 8–27)
Bilirubin Total: 0.5 mg/dL (ref 0.0–1.2)
CO2: 25 mmol/L (ref 20–29)
Calcium: 9.1 mg/dL (ref 8.6–10.2)
Chloride: 99 mmol/L (ref 96–106)
Creatinine, Ser: 1.77 mg/dL — ABNORMAL HIGH (ref 0.76–1.27)
GFR calc Af Amer: 45 mL/min/{1.73_m2} — ABNORMAL LOW (ref >59)
GFR calc non Af Amer: 39 mL/min/{1.73_m2} — ABNORMAL LOW (ref >59)
Globulin, Total: 2.3 g/dL (ref 1.5–4.5)
Glucose: 100 mg/dL — ABNORMAL HIGH (ref 65–99)
Potassium: 4.6 mmol/L (ref 3.5–5.2)
Sodium: 137 mmol/L (ref 134–144)
Total Protein: 6.7 g/dL (ref 6.0–8.5)

## 2020-05-23 ENCOUNTER — Other Ambulatory Visit: Payer: Self-pay | Admitting: Cardiology

## 2020-05-26 ENCOUNTER — Ambulatory Visit (INDEPENDENT_AMBULATORY_CARE_PROVIDER_SITE_OTHER): Payer: Medicare HMO | Admitting: Family Medicine

## 2020-05-26 ENCOUNTER — Encounter: Payer: Self-pay | Admitting: Family Medicine

## 2020-05-26 ENCOUNTER — Other Ambulatory Visit: Payer: Self-pay

## 2020-05-26 VITALS — BP 140/79 | HR 61 | Temp 97.7°F | Ht 71.0 in | Wt 189.5 lb

## 2020-05-26 DIAGNOSIS — E1122 Type 2 diabetes mellitus with diabetic chronic kidney disease: Secondary | ICD-10-CM | POA: Diagnosis not present

## 2020-05-26 DIAGNOSIS — Z23 Encounter for immunization: Secondary | ICD-10-CM | POA: Diagnosis not present

## 2020-05-26 DIAGNOSIS — N183 Chronic kidney disease, stage 3 unspecified: Secondary | ICD-10-CM | POA: Diagnosis not present

## 2020-05-26 DIAGNOSIS — K219 Gastro-esophageal reflux disease without esophagitis: Secondary | ICD-10-CM | POA: Diagnosis not present

## 2020-05-26 DIAGNOSIS — N1831 Chronic kidney disease, stage 3a: Secondary | ICD-10-CM

## 2020-05-26 DIAGNOSIS — I10 Essential (primary) hypertension: Secondary | ICD-10-CM | POA: Diagnosis not present

## 2020-05-26 DIAGNOSIS — H6123 Impacted cerumen, bilateral: Secondary | ICD-10-CM | POA: Diagnosis not present

## 2020-05-26 DIAGNOSIS — E1159 Type 2 diabetes mellitus with other circulatory complications: Secondary | ICD-10-CM | POA: Diagnosis not present

## 2020-05-26 DIAGNOSIS — E211 Secondary hyperparathyroidism, not elsewhere classified: Secondary | ICD-10-CM | POA: Diagnosis not present

## 2020-05-26 DIAGNOSIS — E785 Hyperlipidemia, unspecified: Secondary | ICD-10-CM

## 2020-05-26 DIAGNOSIS — E1169 Type 2 diabetes mellitus with other specified complication: Secondary | ICD-10-CM | POA: Diagnosis not present

## 2020-05-26 DIAGNOSIS — I129 Hypertensive chronic kidney disease with stage 1 through stage 4 chronic kidney disease, or unspecified chronic kidney disease: Secondary | ICD-10-CM | POA: Diagnosis not present

## 2020-05-26 DIAGNOSIS — I152 Hypertension secondary to endocrine disorders: Secondary | ICD-10-CM

## 2020-05-26 DIAGNOSIS — I5042 Chronic combined systolic (congestive) and diastolic (congestive) heart failure: Secondary | ICD-10-CM | POA: Diagnosis not present

## 2020-05-26 DIAGNOSIS — N1832 Chronic kidney disease, stage 3b: Secondary | ICD-10-CM | POA: Diagnosis not present

## 2020-05-26 MED ORDER — OMEPRAZOLE 20 MG PO CPDR: DELAYED_RELEASE_CAPSULE | ORAL | 3 refills | Status: DC

## 2020-05-26 NOTE — Progress Notes (Signed)
BP 140/79   Pulse 61   Temp 97.7 F (36.5 C)   Ht 5' 11" (1.803 m)   Wt 189 lb 8 oz (86 kg)   SpO2 98%   BMI 26.43 kg/m    Subjective:   Patient ID: Joseph [name], male    DOB: [birth year], 64 y.o.   MRN: [id number]  HPI: Joseph [name] is a 64 y.o. male presenting on 05/26/2020 for Medical Management of Chronic Issues, Hypertension, and Hyperlipidemia   HPI Type 2 diabetes mellitus Patient comes in today for recheck of his diabetes. Patient has been currently taking no medication currently has been diet controlled. Patient is currently on an ACE inhibitor/ARB. Patient has not seen an ophthalmologist this year. Patient denies any issues with their feet. The symptom started onset as an adult CKD stage III and CAD and CHF and hypertension and hyperlipidemia ARE RELATED TO DM   Hypertension Patient is currently on enalapril and metoprolol and torsemide, and their blood pressure today is 140/79. Patient denies any lightheadedness or dizziness. Patient denies headaches, blurred vision, chest pains, shortness of breath, or weakness. Denies any side effects from medication and is content with current medication.   Hyperlipidemia Patient is coming in for recheck of his hyperlipidemia. The patient is currently taking fish oil and atorvastatin. They deny any issues with myalgias or history of liver damage from it. They deny any focal numbness or weakness or chest pain.   GERD Patient is currently on omeprazole.  She denies any major symptoms or abdominal pain or belching or burping. She denies any blood in her stool or lightheadedness or dizziness.   Patient comes in complaining of earwax impacted and affecting his hearing in both of his ears.  He has had to have them washed before and wants to have them washed again.  He denies any pain or drainage or fevers or chills.  Relevant past medical, surgical, family and social history reviewed and updated as indicated. Interim medical history since  our last visit reviewed. Allergies and medications reviewed and updated.  Review of Systems  Constitutional: Negative for chills and fever.  HENT: Positive for hearing loss.   Eyes: Negative for visual disturbance.  Respiratory: Negative for shortness of breath and wheezing.   Cardiovascular: Negative for chest pain and leg swelling.  Musculoskeletal: Negative for back pain and gait problem.  Skin: Negative for rash.  Neurological: Negative for dizziness, weakness, light-headedness and numbness.  All other systems reviewed and are negative.   Per HPI unless specifically indicated above   Allergies as of 05/26/2020   No Known Allergies     Medication List       Accurate as of May 26, 2020 12:15 PM. If you have any questions, ask your nurse or doctor.        acetaminophen 500 MG tablet Commonly known as: TYLENOL Take 500 mg by mouth every 6 (six) hours as needed for pain.   albuterol 108 (90 Base) MCG/ACT inhaler Commonly known as: VENTOLIN HFA Inhale 2 puffs into the lungs every 4 (four) hours as needed.   aspirin 81 MG tablet Take 81 mg by mouth at bedtime.   atorvastatin 80 MG tablet Commonly known as: LIPITOR TAKE 1 TABLET BY MOUTH EVERY DAY IN THE EVENING   enalapril 20 MG tablet Commonly known as: VASOTEC TAKE 1 TABLET BY MOUTH TWICE A DAY   ferrous sulfate 325 (65 FE) MG tablet TAKE 1 TABLET BY MOUTH EVERY DAY WITH BREAKFAST  fish oil-omega-3 fatty acids 1000 MG capsule Take 1 g by mouth 3 (three) times daily.   metoprolol tartrate 50 MG tablet Commonly known as: LOPRESSOR TAKE 1 TABLET BY MOUTH THREE TIMES A DAY   MUCINEX PO Take 1 tablet by mouth as needed.   multivitamin tablet Take 1 tablet by mouth daily. With FeSo4   nitroGLYCERIN 0.4 MG SL tablet Commonly known as: NITROSTAT Place 1 tablet (0.4 mg total) under the tongue every 5 (five) minutes as needed for chest pain.   omeprazole 20 MG capsule Commonly known as: PRILOSEC TAKE 1  CAPSULE BY MOUTH EVERY DAY What changed:   how much to take  how to take this  when to take this  additional instructions Changed by: [name] Jasn Xia, MD   potassium chloride SA 20 MEQ tablet Commonly known as: Klor-Con M20 Take 1 tablet (20 mEq total) by mouth daily.   torsemide 20 MG tablet Commonly known as: DEMADEX TAKE 2 TABLETS BY MOUTH EVERY DAY        Objective:   BP 140/79   Pulse 61   Temp 97.7 F (36.5 C)   Ht 5' 11" (1.803 m)   Wt 189 lb 8 oz (86 kg)   SpO2 98%   BMI 26.43 kg/m   Wt Readings from Last 3 Encounters:  05/26/20 189 lb 8 oz (86 kg)  04/07/20 189 lb (85.7 kg)  08/13/19 193 lb (87.5 kg)    Physical Exam Vitals and nursing note reviewed.  Constitutional:      General: He is not in acute distress.    Appearance: He is well-developed. He is not diaphoretic.  Eyes:     General: No scleral icterus.       Right eye: No discharge.     Conjunctiva/sclera: Conjunctivae normal.     Pupils: Pupils are equal, round, and reactive to light.  Neck:     Thyroid: No thyromegaly.  Cardiovascular:     Rate and Rhythm: Normal rate and regular rhythm.     Heart sounds: Normal heart sounds. No murmur heard.   Pulmonary:     Effort: Pulmonary effort is normal. No respiratory distress.     Breath sounds: Normal breath sounds. No wheezing.  Musculoskeletal:        General: Normal range of motion.     Cervical back: Neck supple.  Lymphadenopathy:     Cervical: No cervical adenopathy.  Skin:    General: Skin is warm and dry.     Findings: No rash.  Neurological:     Mental Status: He is alert and oriented to person, place, and time.     Coordination: Coordination normal.  Psychiatric:        Behavior: Behavior normal.     Results for orders placed or performed in visit on 05/19/20  Bayer DCA Hb A1c Waived  Result Value Ref Range   HB A1C (BAYER DCA - WAIVED) 5.9 <7.0 %  CMP14+EGFR  Result Value Ref Range   Glucose 100 (H) 65 - 99 mg/dL     BUN 24 8 - 27 mg/dL   Creatinine, Ser 1.77 (H) 0.76 - 1.27 mg/dL   GFR calc non Af Amer 39 (L) >59 mL/min/1.73   GFR calc Af Amer 45 (L) >59 mL/min/1.73   BUN/Creatinine Ratio 14 10 - 24   Sodium 137 134 - 144 mmol/L   Potassium 4.6 3.5 - 5.2 mmol/L   Chloride 99 96 - 106 mmol/L   CO2 25  20 - 29 mmol/L   Calcium 9.1 8.6 - 10.2 mg/dL   Total Protein 6.7 6.0 - 8.5 g/dL   Albumin 4.4 3.8 - 4.8 g/dL   Globulin, Total 2.3 1.5 - 4.5 g/dL   Albumin/Globulin Ratio 1.9 1.2 - 2.2   Bilirubin Total 0.5 0.0 - 1.2 mg/dL   Alkaline Phosphatase 148 (H) 48 - 121 IU/L   AST 17 0 - 40 IU/L   ALT 22 0 - 44 IU/L   Nurse to lavage cerumen, patient tolerated well, able to clear mostly. Assessment & Plan:   Problem List Items Addressed This Visit      Cardiovascular and Mediastinum   Hypertension associated with diabetes ([place]) - Primary     Digestive   Acid reflux   Relevant Medications   omeprazole (PRILOSEC) 20 MG capsule     Endocrine   Hyperlipidemia associated with type 2 diabetes mellitus ([place])   Controlled diabetes mellitus with stage 3 chronic kidney disease (HCC)     Genitourinary   Stage 3 chronic kidney disease    Other Visit Diagnoses    Hearing loss due to cerumen impaction, bilateral       Need for vaccination against Streptococcus pneumoniae using pneumococcal conjugate vaccine 13       Relevant Orders   Pneumococcal conjugate vaccine 13-valent (Completed)      Patient's renal function has risen but he is following up with Dr. Theador Hawthorne and just saw Dr. Theador Hawthorne this morning who is just going to watch it and not do anything else at this point.  Continue current medication. Follow up plan: Return in about 3 months (around 08/26/2020), or if symptoms worsen or fail to improve, for Recheck diabetes and kidney function, also sees Dr. Theador Hawthorne.  Counseling provided for all of the vaccine components No orders of the defined types were placed in this encounter.   Caryl Pina, MD [facility] 05/26/2020, 12:15 PM

## 2020-06-02 IMAGING — US RENAL/URINARY TRACT ULTRASOUND
1 series · 14 of 25 positions shown · non-contrast
Comparison: None.

CLINICAL DATA: Chronic kidney disease stage 3.

EXAM:
RENAL / URINARY TRACT ULTRASOUND COMPLETE

[Series 1: renal/urinary tract ultrasound · 0.26mm/px · 14 of 90 slices shown]
[im 1/90]
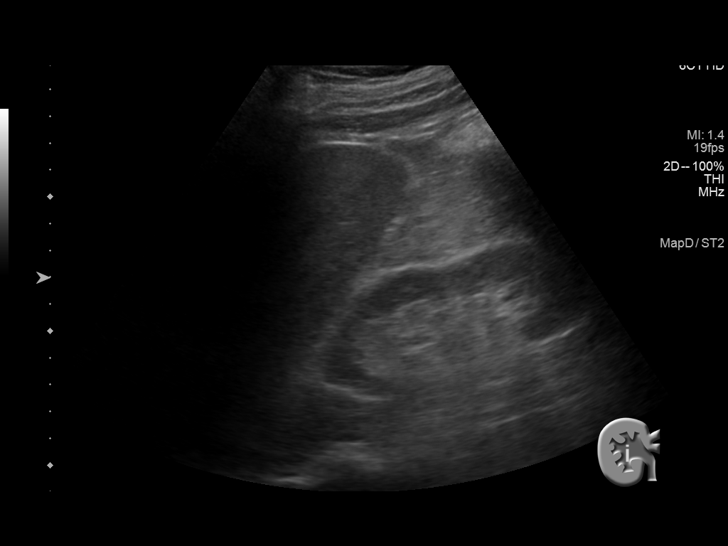
[im 8/90]
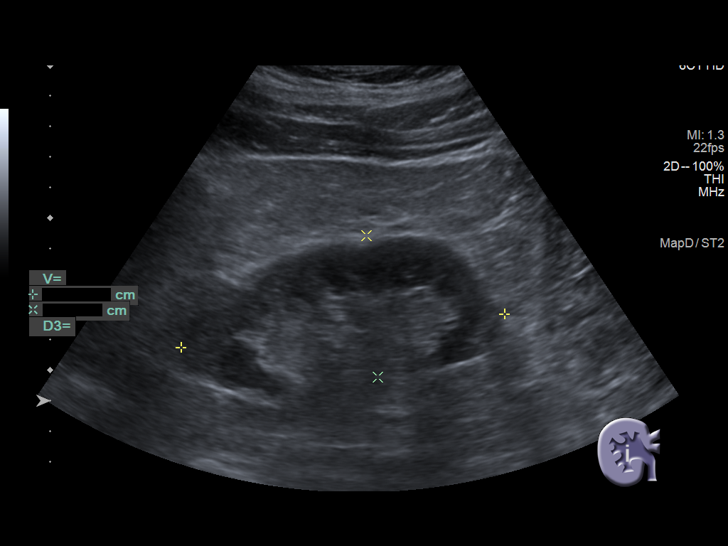
[im 15/90]
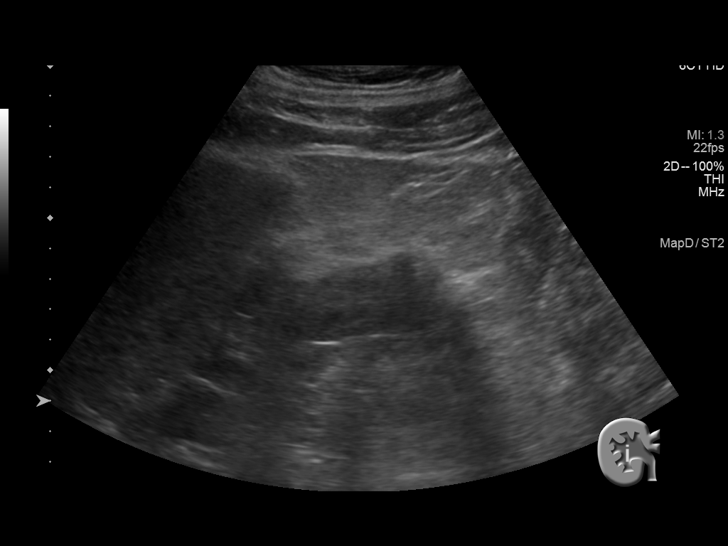
[im 23/90]
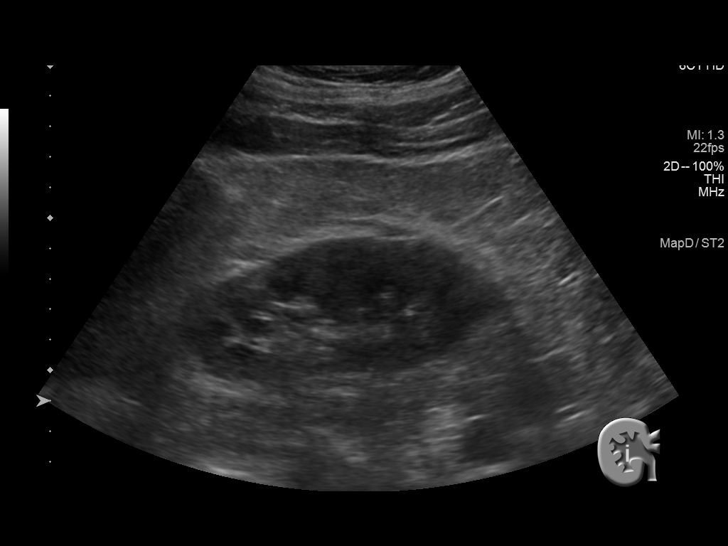
[im 30/90]
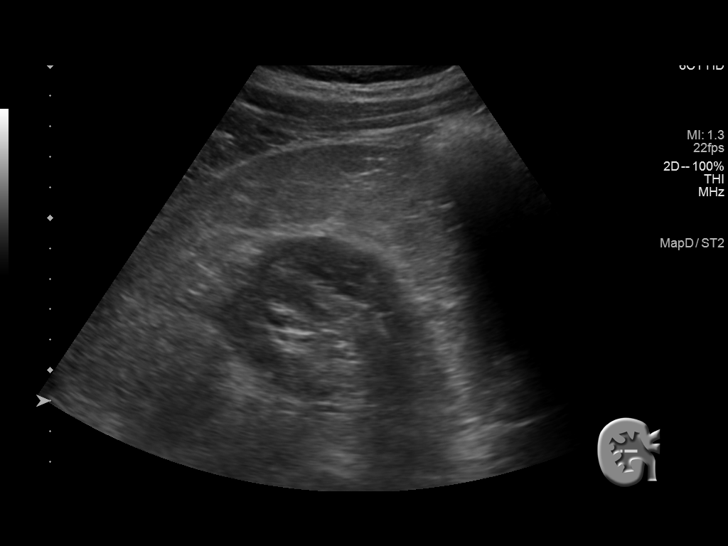
[im 34/90]
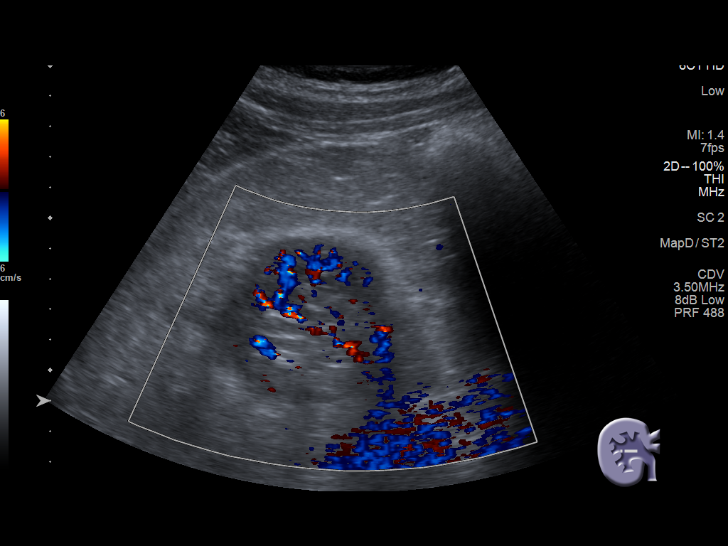
[im 41/90]
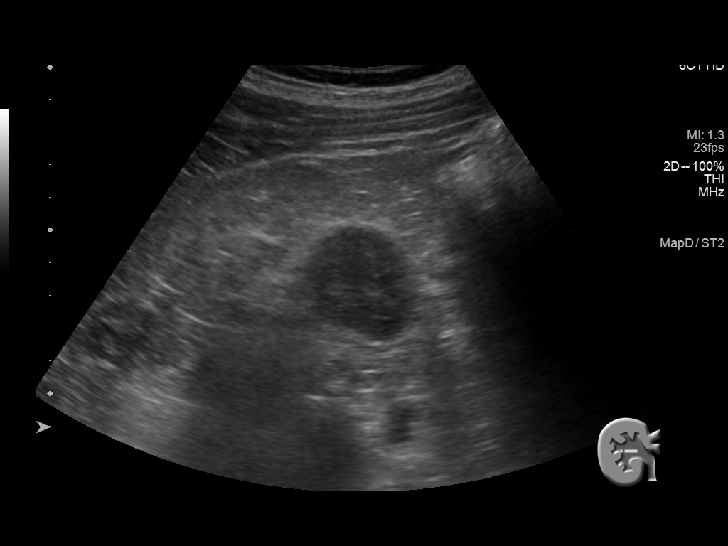
[im 49/90]
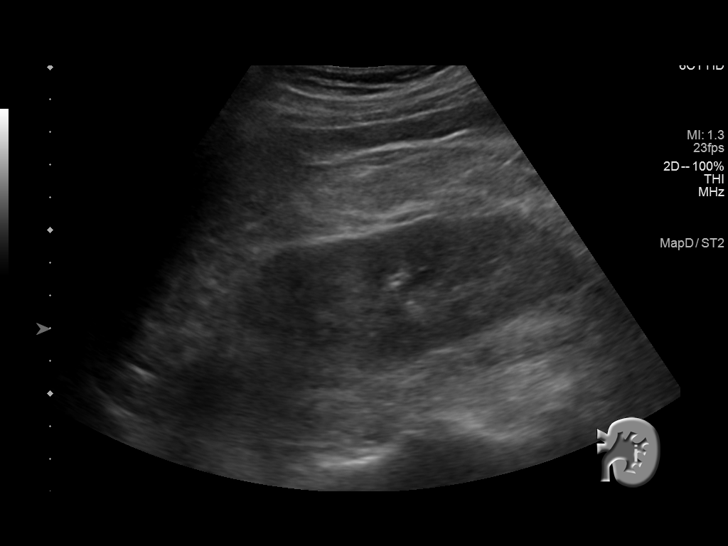
[im 56/90]
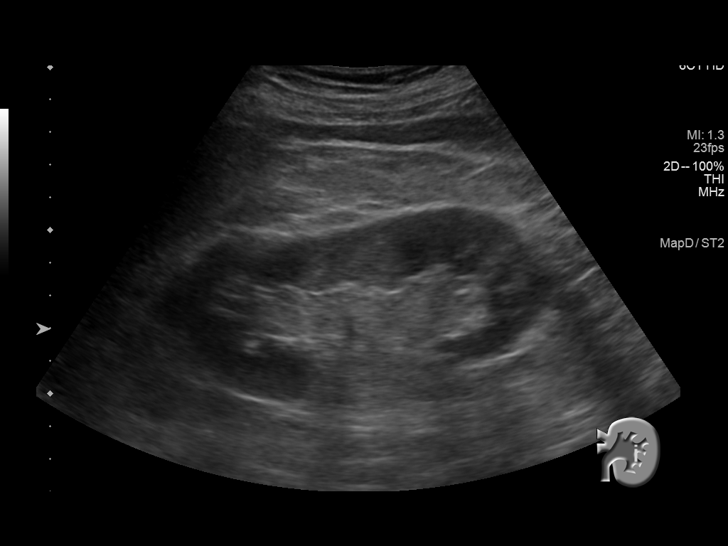
[im 60/90]
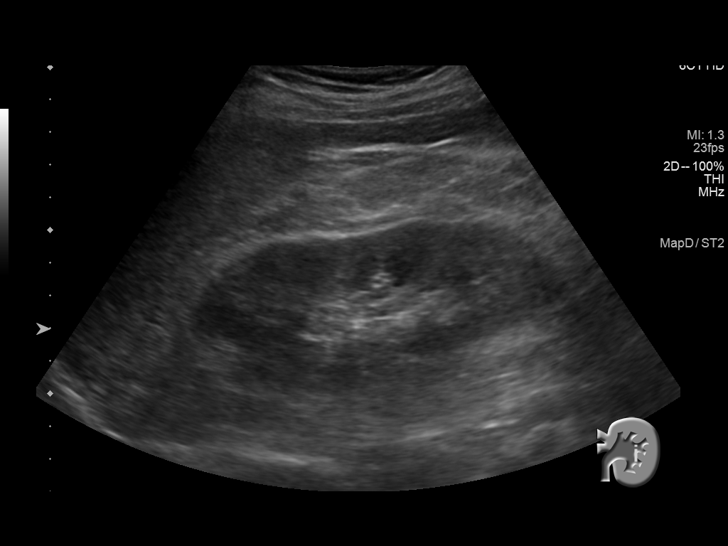
[im 67/90]
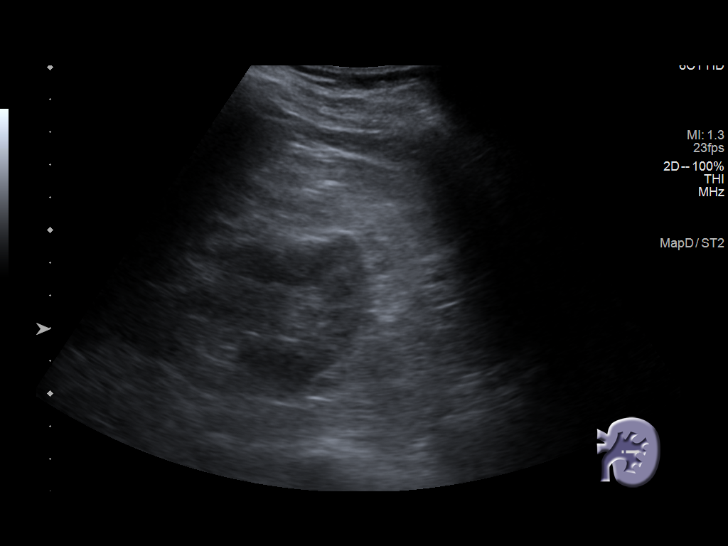
[im 75/90]
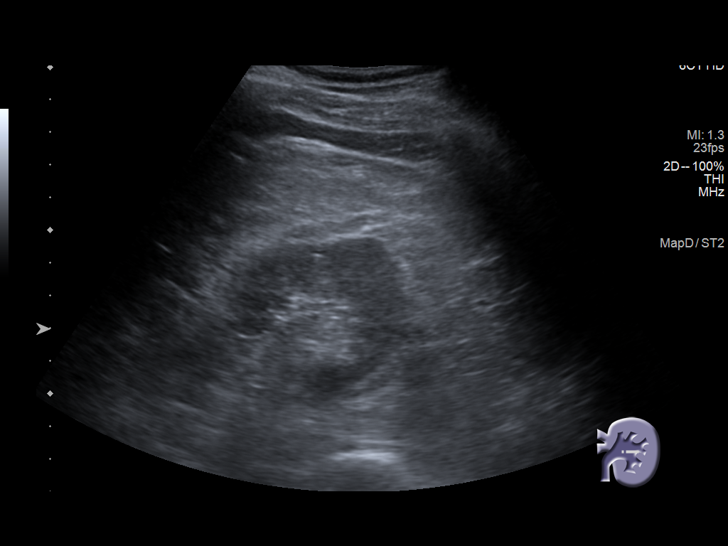
[im 82/90]
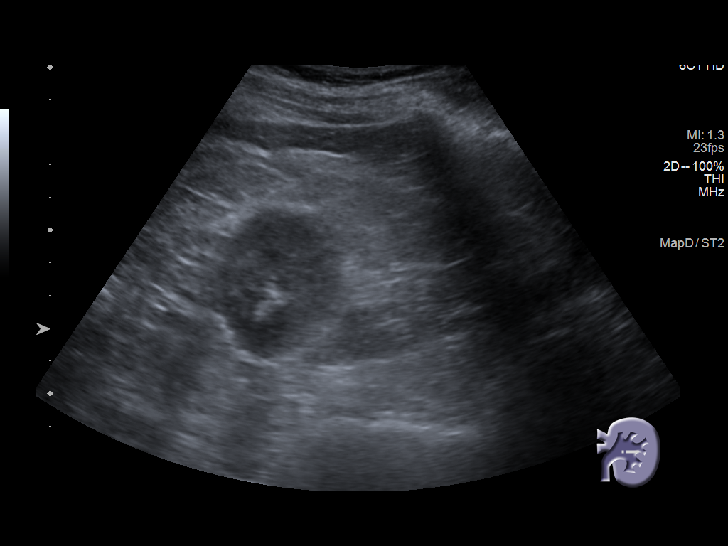
[im 90/90]
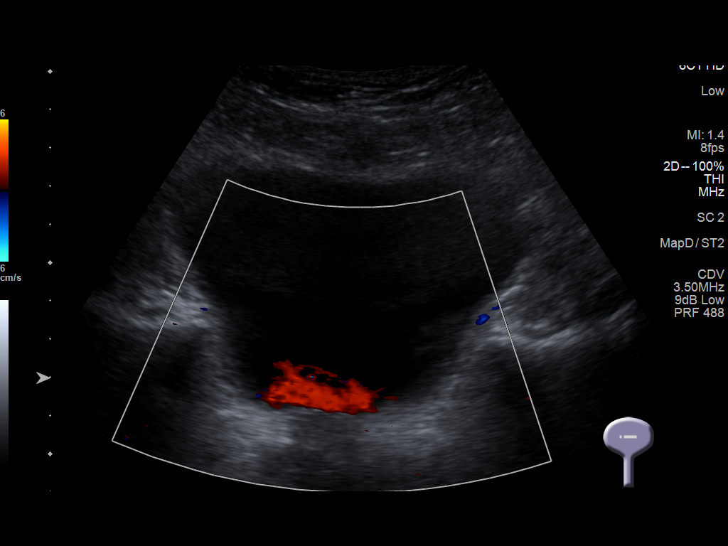

[14 of 25 positions shown; findings below may reference images not displayed]

FINDINGS: Right Kidney:

Renal measurements: 4.7 x 5.7 x 10.7 cm = volume: 149 mL .
Echogenicity within normal limits. No mass or hydronephrosis
visualized.

Left Kidney:

Renal measurements: 4.5 x 5.1 x 11.8 cm = volume: 141 mL.
Echogenicity within normal limits. No mass or hydronephrosis
visualized.

Bladder:

Appears normal for degree of bladder distention. Bilateral ureteral
jets visualized.
IMPRESSION: Normal renal ultrasound.

## 2020-06-14 ENCOUNTER — Other Ambulatory Visit: Payer: Self-pay | Admitting: Student

## 2020-06-19 ENCOUNTER — Other Ambulatory Visit: Payer: Self-pay | Admitting: Family Medicine

## 2020-07-05 ENCOUNTER — Ambulatory Visit (INDEPENDENT_AMBULATORY_CARE_PROVIDER_SITE_OTHER): Payer: Medicare HMO | Admitting: *Deleted

## 2020-07-05 DIAGNOSIS — I5022 Chronic systolic (congestive) heart failure: Secondary | ICD-10-CM

## 2020-07-05 LAB — CUP PACEART REMOTE DEVICE CHECK
Battery Remaining Longevity: 22 mo
Battery Remaining Percentage: 18 %
Battery Voltage: 2.75 V
Brady Statistic RV Percent Paced: 1 %
Date Time Interrogation Session: 20210809040806
HighPow Impedance: 50 Ohm
Implantable Lead Implant Date: 20110209
Implantable Lead Location: 753860
Implantable Lead Model: 7120
Implantable Pulse Generator Implant Date: 20110209
Lead Channel Impedance Value: 440 Ohm
Lead Channel Pacing Threshold Amplitude: 0.75 V
Lead Channel Pacing Threshold Pulse Width: 0.4 ms
Lead Channel Sensing Intrinsic Amplitude: 12 mV
Lead Channel Setting Pacing Amplitude: 2.5 V
Lead Channel Setting Pacing Pulse Width: 0.4 ms
Lead Channel Setting Sensing Sensitivity: 0.5 mV
Pulse Gen Serial Number: 747960

## 2020-07-06 NOTE — Progress Notes (Signed)
Remote ICD transmission.   

## 2020-07-07 ENCOUNTER — Other Ambulatory Visit: Payer: Self-pay

## 2020-07-07 LAB — HM DIABETES EYE EXAM

## 2020-08-19 ENCOUNTER — Other Ambulatory Visit: Payer: Medicare HMO

## 2020-08-19 ENCOUNTER — Other Ambulatory Visit: Payer: Self-pay

## 2020-08-19 ENCOUNTER — Other Ambulatory Visit: Payer: Self-pay | Admitting: Family Medicine

## 2020-08-19 DIAGNOSIS — E785 Hyperlipidemia, unspecified: Secondary | ICD-10-CM

## 2020-08-19 DIAGNOSIS — N1832 Chronic kidney disease, stage 3b: Secondary | ICD-10-CM | POA: Diagnosis not present

## 2020-08-19 DIAGNOSIS — N1831 Chronic kidney disease, stage 3a: Secondary | ICD-10-CM

## 2020-08-19 DIAGNOSIS — D638 Anemia in other chronic diseases classified elsewhere: Secondary | ICD-10-CM | POA: Diagnosis not present

## 2020-08-19 DIAGNOSIS — I152 Hypertension secondary to endocrine disorders: Secondary | ICD-10-CM

## 2020-08-19 DIAGNOSIS — E1122 Type 2 diabetes mellitus with diabetic chronic kidney disease: Secondary | ICD-10-CM | POA: Diagnosis not present

## 2020-08-19 DIAGNOSIS — E1159 Type 2 diabetes mellitus with other circulatory complications: Secondary | ICD-10-CM

## 2020-08-19 DIAGNOSIS — I10 Essential (primary) hypertension: Secondary | ICD-10-CM | POA: Diagnosis not present

## 2020-08-19 DIAGNOSIS — N183 Chronic kidney disease, stage 3 unspecified: Secondary | ICD-10-CM

## 2020-08-19 DIAGNOSIS — E1169 Type 2 diabetes mellitus with other specified complication: Secondary | ICD-10-CM | POA: Diagnosis not present

## 2020-08-19 DIAGNOSIS — E211 Secondary hyperparathyroidism, not elsewhere classified: Secondary | ICD-10-CM | POA: Diagnosis not present

## 2020-08-19 DIAGNOSIS — I5042 Chronic combined systolic (congestive) and diastolic (congestive) heart failure: Secondary | ICD-10-CM | POA: Diagnosis not present

## 2020-08-19 DIAGNOSIS — I129 Hypertensive chronic kidney disease with stage 1 through stage 4 chronic kidney disease, or unspecified chronic kidney disease: Secondary | ICD-10-CM | POA: Diagnosis not present

## 2020-08-19 LAB — BAYER DCA HB A1C WAIVED: HB A1C (BAYER DCA - WAIVED): 5.7 % (ref <7.0)

## 2020-08-19 NOTE — Progress Notes (Signed)
Placed lab orders for the patient 

## 2020-08-20 LAB — CMP14+EGFR
ALT: 21 IU/L (ref 0–44)
AST: 26 IU/L (ref 0–40)
Albumin/Globulin Ratio: 2.1 (ref 1.2–2.2)
Albumin: 4.2 g/dL (ref 3.8–4.8)
Alkaline Phosphatase: 141 IU/L — ABNORMAL HIGH (ref 44–121)
BUN/Creatinine Ratio: 13 (ref 10–24)
BUN: 22 mg/dL (ref 8–27)
Bilirubin Total: 0.4 mg/dL (ref 0.0–1.2)
CO2: 28 mmol/L (ref 20–29)
Calcium: 9.2 mg/dL (ref 8.6–10.2)
Chloride: 100 mmol/L (ref 96–106)
Creatinine, Ser: 1.76 mg/dL — ABNORMAL HIGH (ref 0.76–1.27)
GFR calc Af Amer: 46 mL/min/{1.73_m2} — ABNORMAL LOW (ref >59)
GFR calc non Af Amer: 39 mL/min/{1.73_m2} — ABNORMAL LOW (ref >59)
Globulin, Total: 2 g/dL (ref 1.5–4.5)
Glucose: 116 mg/dL — ABNORMAL HIGH (ref 65–99)
Potassium: 4.9 mmol/L (ref 3.5–5.2)
Sodium: 141 mmol/L (ref 134–144)
Total Protein: 6.2 g/dL (ref 6.0–8.5)

## 2020-08-20 LAB — CBC WITH DIFFERENTIAL/PLATELET
Basophils Absolute: 0 10*3/uL (ref 0.0–0.2)
Basos: 0 %
EOS (ABSOLUTE): 0.1 10*3/uL (ref 0.0–0.4)
Eos: 2 %
Hematocrit: 44.5 % (ref 37.5–51.0)
Hemoglobin: 14.7 g/dL (ref 13.0–17.7)
Immature Grans (Abs): 0 10*3/uL (ref 0.0–0.1)
Immature Granulocytes: 0 %
Lymphocytes Absolute: 2.1 10*3/uL (ref 0.7–3.1)
Lymphs: 23 %
MCH: 30.4 pg (ref 26.6–33.0)
MCHC: 33 g/dL (ref 31.5–35.7)
MCV: 92 fL (ref 79–97)
Monocytes Absolute: 0.7 10*3/uL (ref 0.1–0.9)
Monocytes: 8 %
Neutrophils Absolute: 6.3 10*3/uL (ref 1.4–7.0)
Neutrophils: 67 %
Platelets: 246 10*3/uL (ref 150–450)
RBC: 4.84 x10E6/uL (ref 4.14–5.80)
RDW: 12.5 % (ref 11.6–15.4)
WBC: 9.4 10*3/uL (ref 3.4–10.8)

## 2020-08-20 LAB — LIPID PANEL
Chol/HDL Ratio: 3.4 ratio (ref 0.0–5.0)
Cholesterol, Total: 141 mg/dL (ref 100–199)
HDL: 42 mg/dL (ref >39)
LDL Chol Calc (NIH): 80 mg/dL (ref 0–99)
Triglycerides: 103 mg/dL (ref 0–149)
VLDL Cholesterol Cal: 19 mg/dL (ref 5–40)

## 2020-08-26 DIAGNOSIS — N1832 Chronic kidney disease, stage 3b: Secondary | ICD-10-CM | POA: Diagnosis not present

## 2020-08-26 DIAGNOSIS — E211 Secondary hyperparathyroidism, not elsewhere classified: Secondary | ICD-10-CM | POA: Diagnosis not present

## 2020-08-26 DIAGNOSIS — I129 Hypertensive chronic kidney disease with stage 1 through stage 4 chronic kidney disease, or unspecified chronic kidney disease: Secondary | ICD-10-CM | POA: Diagnosis not present

## 2020-08-26 DIAGNOSIS — I5042 Chronic combined systolic (congestive) and diastolic (congestive) heart failure: Secondary | ICD-10-CM | POA: Diagnosis not present

## 2020-08-30 ENCOUNTER — Ambulatory Visit (INDEPENDENT_AMBULATORY_CARE_PROVIDER_SITE_OTHER): Payer: Medicare HMO | Admitting: Family Medicine

## 2020-08-30 ENCOUNTER — Encounter: Payer: Self-pay | Admitting: Family Medicine

## 2020-08-30 ENCOUNTER — Other Ambulatory Visit: Payer: Self-pay

## 2020-08-30 VITALS — BP 144/77 | HR 77 | Temp 98.3°F | Ht 71.0 in | Wt 189.0 lb

## 2020-08-30 DIAGNOSIS — E1159 Type 2 diabetes mellitus with other circulatory complications: Secondary | ICD-10-CM | POA: Diagnosis not present

## 2020-08-30 DIAGNOSIS — Z1211 Encounter for screening for malignant neoplasm of colon: Secondary | ICD-10-CM

## 2020-08-30 DIAGNOSIS — I5022 Chronic systolic (congestive) heart failure: Secondary | ICD-10-CM

## 2020-08-30 DIAGNOSIS — N183 Chronic kidney disease, stage 3 unspecified: Secondary | ICD-10-CM

## 2020-08-30 DIAGNOSIS — E1122 Type 2 diabetes mellitus with diabetic chronic kidney disease: Secondary | ICD-10-CM

## 2020-08-30 DIAGNOSIS — E785 Hyperlipidemia, unspecified: Secondary | ICD-10-CM | POA: Diagnosis not present

## 2020-08-30 DIAGNOSIS — E1169 Type 2 diabetes mellitus with other specified complication: Secondary | ICD-10-CM | POA: Diagnosis not present

## 2020-08-30 DIAGNOSIS — I152 Hypertension secondary to endocrine disorders: Secondary | ICD-10-CM

## 2020-08-30 DIAGNOSIS — N1831 Chronic kidney disease, stage 3a: Secondary | ICD-10-CM

## 2020-08-30 NOTE — Progress Notes (Signed)
BP (!) 144/77   Pulse 77   Temp 98.3 F (36.8 C)   Ht '5\' 11"'  (1.803 m)   Wt 189 lb (85.7 kg)   SpO2 98%   BMI 26.36 kg/m    Subjective:   Patient ID: Joseph [name], male    DOB: December 15, 1955, 64 y.o.   MRN: [id number]  HPI: Joseph [name] is a 64 y.o. male presenting on 08/30/2020 for Medical Management of Chronic Issues, Diabetes, Hyperlipidemia, and Hypertension   HPI Type 2 diabetes mellitus Patient comes in today for recheck of his diabetes. Patient has been currently taking no medication and is diet controlled, A1c was 5.6 a week ago.. Patient is currently on an ACE inhibitor/ARB. Patient has not seen an ophthalmologist this year. Patient denies any issues with their feet. The symptom started onset as an adult hypertension and hyperlipidemia and stage III CKD ARE RELATED TO DM   Hyperlipidemia Patient is coming in for recheck of his hyperlipidemia. The patient is currently taking atorvastatin and fish oils. They deny any issues with myalgias or history of liver damage from it. They deny any focal numbness or weakness or chest pain.   Hypertension Patient is currently on enalapril and metoprolol, and their blood pressure today is 144/77. Patient denies any lightheadedness or dizziness. Patient denies headaches, blurred vision, chest pains, shortness of breath, or weakness. Denies any side effects from medication and is content with current medication.   Relevant past medical, surgical, family and social history reviewed and updated as indicated. Interim medical history since our last visit reviewed. Allergies and medications reviewed and updated.  Review of Systems  Constitutional: Negative for chills and fever.  Eyes: Negative for visual disturbance.  Respiratory: Negative for shortness of breath and wheezing.   Cardiovascular: Negative for chest pain and leg swelling.  Musculoskeletal: Negative for back pain and gait problem.  Skin: Negative for rash.  Neurological: Negative  for dizziness, weakness and light-headedness.  All other systems reviewed and are negative.   Per HPI unless specifically indicated above   Allergies as of 08/30/2020   No Known Allergies     Medication List       Accurate as of August 30, 2020 11:04 AM. If you have any questions, ask your nurse or doctor.        acetaminophen 500 MG tablet Commonly known as: TYLENOL Take 500 mg by mouth every 6 (six) hours as needed for pain.   albuterol 108 (90 Base) MCG/ACT inhaler Commonly known as: VENTOLIN HFA Inhale 2 puffs into the lungs every 4 (four) hours as needed.   aspirin 81 MG tablet Take 81 mg by mouth at bedtime.   atorvastatin 80 MG tablet Commonly known as: LIPITOR TAKE 1 TABLET BY MOUTH EVERY DAY IN THE EVENING   enalapril 20 MG tablet Commonly known as: VASOTEC TAKE 1 TABLET BY MOUTH TWICE A DAY   ferrous sulfate 325 (65 FE) MG tablet TAKE 1 TABLET BY MOUTH EVERY DAY WITH BREAKFAST   fish oil-omega-3 fatty acids 1000 MG capsule Take 1 g by mouth 3 (three) times daily.   Klor-Con M20 20 MEQ tablet Generic drug: potassium chloride SA TAKE 1 TABLET BY MOUTH EVERY DAY   metoprolol tartrate 50 MG tablet Commonly known as: LOPRESSOR TAKE 1 TABLET BY MOUTH THREE TIMES A DAY   MUCINEX PO Take 1 tablet by mouth as needed.   multivitamin tablet Take 1 tablet by mouth daily. With FeSo4   nitroGLYCERIN 0.4 MG  SL tablet Commonly known as: NITROSTAT Place 1 tablet (0.4 mg total) under the tongue every 5 (five) minutes as needed for chest pain.   omeprazole 20 MG capsule Commonly known as: PRILOSEC TAKE 1 CAPSULE BY MOUTH EVERY DAY   torsemide 20 MG tablet Commonly known as: DEMADEX TAKE 2 TABLETS BY MOUTH EVERY DAY        Objective:   BP (!) 144/77   Pulse 77   Temp 98.3 F (36.8 C)   Ht '5\' 11"'  (1.803 m)   Wt 189 lb (85.7 kg)   SpO2 98%   BMI 26.36 kg/m   Wt Readings from Last 3 Encounters:  08/30/20 189 lb (85.7 kg)  05/26/20 189 lb 8 oz  (86 kg)  04/07/20 189 lb (85.7 kg)    Physical Exam Vitals and nursing note reviewed.  Constitutional:      General: He is not in acute distress.    Appearance: He is well-developed. He is not diaphoretic.  Eyes:     General: No scleral icterus.    Conjunctiva/sclera: Conjunctivae normal.  Neck:     Thyroid: No thyromegaly.  Cardiovascular:     Rate and Rhythm: Normal rate and regular rhythm.     Heart sounds: Normal heart sounds. No murmur heard.   Pulmonary:     Effort: Pulmonary effort is normal. No respiratory distress.     Breath sounds: Normal breath sounds. No wheezing.  Musculoskeletal:        General: Normal range of motion.     Cervical back: Neck supple.  Lymphadenopathy:     Cervical: No cervical adenopathy.  Skin:    General: Skin is warm and dry.     Findings: No rash.  Neurological:     Mental Status: He is alert and oriented to person, place, and time.     Coordination: Coordination normal.  Psychiatric:        Behavior: Behavior normal.     Results for orders placed or performed in visit on 08/19/20  Lipid panel  Result Value Ref Range   Cholesterol, Total 141 100 - 199 mg/dL   Triglycerides 103 0 - 149 mg/dL   HDL 42 >39 mg/dL   VLDL Cholesterol Cal 19 5 - 40 mg/dL   LDL Chol Calc (NIH) 80 0 - 99 mg/dL   Chol/HDL Ratio 3.4 0.0 - 5.0 ratio  CBC with Differential/Platelet  Result Value Ref Range   WBC 9.4 3.4 - 10.8 x10E3/uL   RBC 4.84 4.14 - 5.80 x10E6/uL   Hemoglobin 14.7 13.0 - 17.7 g/dL   Hematocrit 44.5 37.5 - 51.0 %   MCV 92 79 - 97 fL   MCH 30.4 26.6 - 33.0 pg   MCHC 33.0 31 - 35 g/dL   RDW 12.5 11.6 - 15.4 %   Platelets 246 150 - 450 x10E3/uL   Neutrophils 67 Not Estab. %   Lymphs 23 Not Estab. %   Monocytes 8 Not Estab. %   Eos 2 Not Estab. %   Basos 0 Not Estab. %   Neutrophils Absolute 6.3 1 - 7 x10E3/uL   Lymphocytes Absolute 2.1 0 - 3 x10E3/uL   Monocytes Absolute 0.7 0 - 0 x10E3/uL   EOS (ABSOLUTE) 0.1 0.0 - 0.4 x10E3/uL    Basophils Absolute 0.0 0 - 0 x10E3/uL   Immature Granulocytes 0 Not Estab. %   Immature Grans (Abs) 0.0 0.0 - 0.1 x10E3/uL  Bayer DCA Hb A1c Waived  Result Value Ref Range  HB A1C (BAYER DCA - WAIVED) 5.7 <7.0 %  CMP14+EGFR  Result Value Ref Range   Glucose 116 (H) 65 - 99 mg/dL   BUN 22 8 - 27 mg/dL   Creatinine, Ser 1.76 (H) 0.76 - 1.27 mg/dL   GFR calc non Af Amer 39 (L) >59 mL/min/1.73   GFR calc Af Amer 46 (L) >59 mL/min/1.73   BUN/Creatinine Ratio 13 10 - 24   Sodium 141 134 - 144 mmol/L   Potassium 4.9 3.5 - 5.2 mmol/L   Chloride 100 96 - 106 mmol/L   CO2 28 20 - 29 mmol/L   Calcium 9.2 8.6 - 10.2 mg/dL   Total Protein 6.2 6.0 - 8.5 g/dL   Albumin 4.2 3.8 - 4.8 g/dL   Globulin, Total 2.0 1.5 - 4.5 g/dL   Albumin/Globulin Ratio 2.1 1.2 - 2.2   Bilirubin Total 0.4 0.0 - 1.2 mg/dL   Alkaline Phosphatase 141 (H) 44 - 121 IU/L   AST 26 0 - 40 IU/L   ALT 21 0 - 44 IU/L    Assessment & Plan:   Problem List Items Addressed This Visit      Cardiovascular and Mediastinum   Chronic systolic heart failure (HCC)   Hypertension associated with diabetes (HCC)     Endocrine   Hyperlipidemia associated with type 2 diabetes mellitus ([place])   Controlled diabetes mellitus with stage 3 chronic kidney disease (HCC) - Primary   Relevant Orders   CMP14+EGFR   Bayer DCA Hb A1c Waived     Genitourinary   Stage 3 chronic kidney disease (HCC)   Relevant Orders   CMP14+EGFR    Other Visit Diagnoses    Colon cancer screening       Relevant Orders   Cologuard      Rest of his blood work looks good except for kidney function, he sees a nephrologist as well. Follow up plan: Return in about 3 months (around 11/30/2020), or if symptoms worsen or fail to improve, for Diabetes and hypertension and cholesterol.  Counseling provided for all of the vaccine components Orders Placed This Encounter  Procedures  . Cologuard  . CMP14+EGFR  . Bayer [facility] Hb A1c [place],  MD [facility] 08/30/2020, 11:04 AM

## 2020-09-06 DIAGNOSIS — I5023 Acute on chronic systolic (congestive) heart failure: Secondary | ICD-10-CM | POA: Diagnosis not present

## 2020-09-06 DIAGNOSIS — I110 Hypertensive heart disease with heart failure: Secondary | ICD-10-CM | POA: Diagnosis not present

## 2020-09-06 DIAGNOSIS — Z951 Presence of aortocoronary bypass graft: Secondary | ICD-10-CM | POA: Diagnosis not present

## 2020-09-06 DIAGNOSIS — J810 Acute pulmonary edema: Secondary | ICD-10-CM | POA: Diagnosis not present

## 2020-09-06 DIAGNOSIS — Z20822 Contact with and (suspected) exposure to covid-19: Secondary | ICD-10-CM | POA: Diagnosis not present

## 2020-09-06 DIAGNOSIS — I248 Other forms of acute ischemic heart disease: Secondary | ICD-10-CM | POA: Diagnosis not present

## 2020-09-06 DIAGNOSIS — N1832 Chronic kidney disease, stage 3b: Secondary | ICD-10-CM | POA: Diagnosis not present

## 2020-09-06 DIAGNOSIS — R0902 Hypoxemia: Secondary | ICD-10-CM | POA: Diagnosis not present

## 2020-09-06 DIAGNOSIS — Z7982 Long term (current) use of aspirin: Secondary | ICD-10-CM | POA: Diagnosis not present

## 2020-09-06 DIAGNOSIS — J90 Pleural effusion, not elsewhere classified: Secondary | ICD-10-CM | POA: Diagnosis not present

## 2020-09-06 DIAGNOSIS — I130 Hypertensive heart and chronic kidney disease with heart failure and stage 1 through stage 4 chronic kidney disease, or unspecified chronic kidney disease: Secondary | ICD-10-CM | POA: Diagnosis not present

## 2020-09-06 DIAGNOSIS — J80 Acute respiratory distress syndrome: Secondary | ICD-10-CM | POA: Diagnosis not present

## 2020-09-06 DIAGNOSIS — I509 Heart failure, unspecified: Secondary | ICD-10-CM | POA: Diagnosis not present

## 2020-09-06 DIAGNOSIS — R0602 Shortness of breath: Secondary | ICD-10-CM | POA: Diagnosis not present

## 2020-09-06 DIAGNOSIS — Z95810 Presence of automatic (implantable) cardiac defibrillator: Secondary | ICD-10-CM | POA: Diagnosis not present

## 2020-09-06 DIAGNOSIS — J9601 Acute respiratory failure with hypoxia: Secondary | ICD-10-CM | POA: Diagnosis not present

## 2020-09-06 DIAGNOSIS — E1122 Type 2 diabetes mellitus with diabetic chronic kidney disease: Secondary | ICD-10-CM | POA: Diagnosis not present

## 2020-09-06 DIAGNOSIS — I5089 Other heart failure: Secondary | ICD-10-CM | POA: Diagnosis not present

## 2020-09-06 DIAGNOSIS — J189 Pneumonia, unspecified organism: Secondary | ICD-10-CM | POA: Diagnosis not present

## 2020-09-06 DIAGNOSIS — J449 Chronic obstructive pulmonary disease, unspecified: Secondary | ICD-10-CM | POA: Diagnosis not present

## 2020-09-06 DIAGNOSIS — I272 Pulmonary hypertension, unspecified: Secondary | ICD-10-CM | POA: Diagnosis not present

## 2020-09-06 DIAGNOSIS — I491 Atrial premature depolarization: Secondary | ICD-10-CM | POA: Diagnosis not present

## 2020-09-06 DIAGNOSIS — R069 Unspecified abnormalities of breathing: Secondary | ICD-10-CM | POA: Diagnosis not present

## 2020-09-07 ENCOUNTER — Other Ambulatory Visit: Payer: Self-pay | Admitting: Cardiology

## 2020-09-07 DIAGNOSIS — I2510 Atherosclerotic heart disease of native coronary artery without angina pectoris: Secondary | ICD-10-CM | POA: Diagnosis not present

## 2020-09-07 DIAGNOSIS — I517 Cardiomegaly: Secondary | ICD-10-CM | POA: Diagnosis not present

## 2020-09-07 DIAGNOSIS — I5189 Other ill-defined heart diseases: Secondary | ICD-10-CM | POA: Diagnosis not present

## 2020-09-07 DIAGNOSIS — I340 Nonrheumatic mitral (valve) insufficiency: Secondary | ICD-10-CM | POA: Diagnosis not present

## 2020-09-07 DIAGNOSIS — I5089 Other heart failure: Secondary | ICD-10-CM | POA: Diagnosis not present

## 2020-09-07 DIAGNOSIS — N183 Chronic kidney disease, stage 3 unspecified: Secondary | ICD-10-CM | POA: Diagnosis not present

## 2020-09-08 ENCOUNTER — Other Ambulatory Visit: Payer: Self-pay

## 2020-09-08 ENCOUNTER — Emergency Department (HOSPITAL_COMMUNITY)
Admission: EM | Admit: 2020-09-08 | Discharge: 2020-09-08 | Disposition: A | Discharge status: Home / Self Care | Payer: Medicare HMO | Attending: Emergency Medicine | Admitting: Emergency Medicine

## 2020-09-08 ENCOUNTER — Encounter (HOSPITAL_COMMUNITY): Payer: Self-pay | Admitting: Emergency Medicine

## 2020-09-08 ENCOUNTER — Emergency Department (HOSPITAL_COMMUNITY): Payer: Medicare HMO

## 2020-09-08 DIAGNOSIS — N289 Disorder of kidney and ureter, unspecified: Secondary | ICD-10-CM | POA: Diagnosis not present

## 2020-09-08 DIAGNOSIS — I130 Hypertensive heart and chronic kidney disease with heart failure and stage 1 through stage 4 chronic kidney disease, or unspecified chronic kidney disease: Secondary | ICD-10-CM | POA: Insufficient documentation

## 2020-09-08 DIAGNOSIS — Z7982 Long term (current) use of aspirin: Secondary | ICD-10-CM | POA: Diagnosis not present

## 2020-09-08 DIAGNOSIS — N183 Chronic kidney disease, stage 3 unspecified: Secondary | ICD-10-CM | POA: Diagnosis not present

## 2020-09-08 DIAGNOSIS — R062 Wheezing: Secondary | ICD-10-CM | POA: Diagnosis not present

## 2020-09-08 DIAGNOSIS — R0602 Shortness of breath: Secondary | ICD-10-CM | POA: Insufficient documentation

## 2020-09-08 DIAGNOSIS — I2510 Atherosclerotic heart disease of native coronary artery without angina pectoris: Secondary | ICD-10-CM | POA: Diagnosis not present

## 2020-09-08 DIAGNOSIS — R0689 Other abnormalities of breathing: Secondary | ICD-10-CM | POA: Diagnosis not present

## 2020-09-08 DIAGNOSIS — J449 Chronic obstructive pulmonary disease, unspecified: Secondary | ICD-10-CM | POA: Diagnosis not present

## 2020-09-08 DIAGNOSIS — Z955 Presence of coronary angioplasty implant and graft: Secondary | ICD-10-CM | POA: Insufficient documentation

## 2020-09-08 DIAGNOSIS — Z87891 Personal history of nicotine dependence: Secondary | ICD-10-CM | POA: Insufficient documentation

## 2020-09-08 DIAGNOSIS — J80 Acute respiratory distress syndrome: Secondary | ICD-10-CM | POA: Diagnosis not present

## 2020-09-08 DIAGNOSIS — I5023 Acute on chronic systolic (congestive) heart failure: Secondary | ICD-10-CM | POA: Diagnosis not present

## 2020-09-08 DIAGNOSIS — I110 Hypertensive heart disease with heart failure: Secondary | ICD-10-CM | POA: Diagnosis not present

## 2020-09-08 DIAGNOSIS — E1122 Type 2 diabetes mellitus with diabetic chronic kidney disease: Secondary | ICD-10-CM | POA: Insufficient documentation

## 2020-09-08 DIAGNOSIS — Z79899 Other long term (current) drug therapy: Secondary | ICD-10-CM | POA: Insufficient documentation

## 2020-09-08 DIAGNOSIS — J90 Pleural effusion, not elsewhere classified: Secondary | ICD-10-CM | POA: Diagnosis not present

## 2020-09-08 DIAGNOSIS — Z95810 Presence of automatic (implantable) cardiac defibrillator: Secondary | ICD-10-CM | POA: Insufficient documentation

## 2020-09-08 DIAGNOSIS — I517 Cardiomegaly: Secondary | ICD-10-CM | POA: Diagnosis not present

## 2020-09-08 DIAGNOSIS — J189 Pneumonia, unspecified organism: Secondary | ICD-10-CM | POA: Diagnosis not present

## 2020-09-08 LAB — CBC WITH DIFFERENTIAL/PLATELET
Abs Immature Granulocytes: 0.1 10*3/uL — ABNORMAL HIGH (ref 0.00–0.07)
Basophils Absolute: 0 10*3/uL (ref 0.0–0.1)
Basophils Relative: 0 %
Eosinophils Absolute: 0 10*3/uL (ref 0.0–0.5)
Eosinophils Relative: 0 %
HCT: 42 % (ref 39.0–52.0)
Hemoglobin: 14 g/dL (ref 13.0–17.0)
Immature Granulocytes: 1 %
Lymphocytes Relative: 3 %
Lymphs Abs: 0.6 10*3/uL — ABNORMAL LOW (ref 0.7–4.0)
MCH: 30.2 pg (ref 26.0–34.0)
MCHC: 33.3 g/dL (ref 30.0–36.0)
MCV: 90.5 fL (ref 80.0–100.0)
Monocytes Absolute: 0.8 10*3/uL (ref 0.1–1.0)
Monocytes Relative: 4 %
Neutro Abs: 16.9 10*3/uL — ABNORMAL HIGH (ref 1.7–7.7)
Neutrophils Relative %: 92 %
Platelets: 229 10*3/uL (ref 150–400)
RBC: 4.64 MIL/uL (ref 4.22–5.81)
RDW: 13.6 % (ref 11.5–15.5)
WBC: 18.4 10*3/uL — ABNORMAL HIGH (ref 4.0–10.5)
nRBC: 0 % (ref 0.0–0.2)

## 2020-09-08 LAB — BASIC METABOLIC PANEL
Anion gap: 10 (ref 5–15)
BUN: 31 mg/dL — ABNORMAL HIGH (ref 8–23)
CO2: 26 mmol/L (ref 22–32)
Calcium: 9 mg/dL (ref 8.9–10.3)
Chloride: 102 mmol/L (ref 98–111)
Creatinine, Ser: 1.34 mg/dL — ABNORMAL HIGH (ref 0.61–1.24)
GFR, Estimated: 55 mL/min — ABNORMAL LOW (ref >60)
Glucose, Bld: 133 mg/dL — ABNORMAL HIGH (ref 70–99)
Potassium: 4.5 mmol/L (ref 3.5–5.1)
Sodium: 138 mmol/L (ref 135–145)

## 2020-09-08 LAB — BRAIN NATRIURETIC PEPTIDE: B Natriuretic Peptide: 172 pg/mL — ABNORMAL HIGH (ref 0.0–100.0)

## 2020-09-08 LAB — TROPONIN I (HIGH SENSITIVITY): Troponin I (High Sensitivity): 29 ng/L — ABNORMAL HIGH (ref <18)

## 2020-09-08 MED ORDER — FUROSEMIDE 10 MG/ML IJ SOLN
80.0000 mg | Freq: Once | INTRAMUSCULAR | Status: AC
  Administered 2020-09-08: 80 mg via INTRAVENOUS
  Filled 2020-09-08: qty 8

## 2020-09-08 MED ORDER — IPRATROPIUM-ALBUTEROL 0.5-2.5 (3) MG/3ML IN SOLN
3.0000 mL | Freq: Once | RESPIRATORY_TRACT | Status: AC
  Administered 2020-09-08: 3 mL via RESPIRATORY_TRACT
  Filled 2020-09-08: qty 3

## 2020-09-08 MED ORDER — ALBUTEROL SULFATE HFA 108 (90 BASE) MCG/ACT IN AERS: 2.0000 | INHALATION_SPRAY | RESPIRATORY_TRACT | 0 refills | Status: AC | PRN

## 2020-09-08 NOTE — Discharge Instructions (Signed)
Increase your Torsemide to 4 tablets every morning for the next three days. Then, resume taking two tablets every morning.  Return if you are having any problems.

## 2020-09-08 NOTE — ED Provider Notes (Signed)
[facility] EMERGENCY DEPARTMENT Provider Note   CSN: [id number] Arrival date & time: 09/08/20  0356   History Chief Complaint  Patient presents with  . Shortness of Breath    Joseph [name] is a 64 y.o. male.  The history is provided by the patient.  Shortness of Breath He has history of hypertension, diabetes, hyperlipidemia, systolic heart failure, coronary artery disease, chronic kidney disease and was just discharged from [facility] this afternoon.  He started developing increasing shortness of breath tonight.  There has been a cough which is nonproductive.  Dyspnea is worse when he lays flat.  He denies fever, chills, sweats.  He had tested negative for COVID-19 when admitted to [facility].  He was brought in by ambulance who gave him albuterol with some improvement.  He was also given methylprednisolone.  Past Medical History:  Diagnosis Date  . CHF (congestive heart failure) (HCC)    a. EF 25-30% in 2016 b. 35-40% by echo in 10/2017  . COPD (chronic obstructive pulmonary disease) (HCC)   . Coronary artery disease   . GI bleed    Mallory-Weiss tear EGD 02/2010  . Hyperlipidemia   . Hypertension   . Ileus (HCC) 2006   required hospitalization  . Left ventricular dysfunction    apical thrombus  . MRSA bacteremia   . 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 Jude  . Small bowel obstruction (HCC) 2007   resolved without surgery    Patient Active Problem List   Diagnosis Date Noted  . Stage 3 chronic kidney disease (HCC) 08/11/2019  . Anemia 01/06/2019  . Heme + stool 09/10/2017  . Controlled diabetes mellitus with stage 3 chronic kidney disease (HCC) 08/15/2017  . Hypertension associated with diabetes (HCC)   . Arteriosclerosis of coronary artery 08/05/2014  . Acid reflux 08/05/2014  . Hx of CABG 09/06/2011  . Chronic systolic heart failure (HCC) 05/02/2011  . LAD stenosis   . Coronary artery disease    . Automatic implantable cardioverter-defibrillator in situ 01/12/2010  . Other specified forms of chronic ischemic heart disease 11/03/2009  . Cardiomyopathy, ischemic 11/03/2009  . Hyperlipidemia associated with type 2 diabetes mellitus (HCC) 02/02/2009    Past Surgical History:  Procedure Laterality Date  . basilic vein left arm resection    . CARDIAC DEFIBRILLATOR PLACEMENT    . CORONARY ARTERY BYPASS GRAFT     6 vessels, 2000  . INSERT / REPLACE / REMOVE PACEMAKER  01/05/10   ICD insertion/St. Jude single chamber       Family History  Problem Relation Age of Onset  . Heart attack Mother   . Emphysema Father   . Coronary artery disease Father        s/p cabg  . Colon cancer Neg Hx     Social History   Tobacco Use  . Smoking status: Former Smoker    Types: Cigarettes    Start date: 11/28/1967    Quit date: 02/29/1988    Years since quitting: 32.5  . Smokeless tobacco: Never Used  Vaping Use  . Vaping Use: Never used  Substance Use Topics  . Alcohol use: No    Alcohol/week: 0.0 standard drinks  . Drug use: No    Home Medications Prior to Admission medications   Medication Sig Start Date End Date Taking? Authorizing Provider  acetaminophen (TYLENOL) 500 MG tablet Take 500 mg by mouth every 6 (six) hours as needed for pain.  [provider]  albuterol (PROVENTIL HFA;VENTOLIN HFA) 108 (90 Base) MCG/ACT inhaler Inhale 2 puffs into the lungs every 4 (four) hours as needed. 03/27/16   Devoria Albe, MD  aspirin 81 MG tablet Take 81 mg by mouth at bedtime.     [provider]  atorvastatin (LIPITOR) 80 MG tablet TAKE 1 TABLET BY MOUTH EVERY DAY IN THE EVENING 03/15/20   [name], Elige Radon, MD  enalapril (VASOTEC) 20 MG tablet TAKE 1 TABLET BY MOUTH TWICE A DAY 05/25/20   Rollene Rotunda, MD  ferrous sulfate 325 (65 FE) MG tablet TAKE 1 TABLET BY MOUTH EVERY DAY WITH BREAKFAST 01/02/20   [name], Elige Radon, MD  fish oil-omega-3 fatty acids 1000 MG capsule Take 1  g by mouth 3 (three) times daily.     [provider]  guaiFENesin (MUCINEX PO) Take 1 tablet by mouth as needed.     [provider]  KLOR-CON M20 20 MEQ tablet TAKE 1 TABLET BY MOUTH EVERY DAY 06/15/20   Rollene Rotunda, MD  metoprolol tartrate (LOPRESSOR) 50 MG tablet TAKE 1 TABLET BY MOUTH THREE TIMES A DAY 03/15/20   Rollene Rotunda, MD  Multiple Vitamin (MULTIVITAMIN) tablet Take 1 tablet by mouth daily. With FeSo4    [provider]  nitroGLYCERIN (NITROSTAT) 0.4 MG SL tablet Place 1 tablet (0.4 mg total) under the tongue every 5 (five) minutes as needed for chest pain. 05/24/16   Rollene Rotunda, MD  omeprazole (PRILOSEC) 20 MG capsule TAKE 1 CAPSULE BY MOUTH EVERY DAY 05/26/20   [name], Elige Radon, MD  torsemide (DEMADEX) 20 MG tablet TAKE 2 TABLETS BY MOUTH EVERY DAY 06/21/20   [name], Elige Radon, MD    Allergies    Patient has no known allergies.  Review of Systems   Review of Systems  Respiratory: Positive for shortness of breath.   All other systems reviewed and are negative.   Physical Exam Updated Vital Signs BP (!) 148/82   Pulse 68   Resp 11   Ht 5\' 11"  (1.803 m)   Wt 81.6 kg   SpO2 99%   BMI 25.10 kg/m   Physical Exam Vitals and nursing note reviewed.   64 year old male, resting comfortably and in no acute distress. Vital signs are significant for mildly elevated blood pressure. Oxygen saturation is 99%, which is normal. Head is normocephalic and atraumatic. PERRLA, EOMI. Oropharynx is clear. Neck is nontender and supple without adenopathy or JVD. Back is nontender and there is no CVA tenderness.  There is 1+ presacral edema. Lungs have bibasilar rales going about three quarters of the way up, and scattered expiratory wheezes.  There are no rhonchi. Chest is nontender. Heart has regular rate and rhythm without murmur. Abdomen is soft, flat, nontender without masses or hepatosplenomegaly and peristalsis is normoactive. Extremities have  2+ pretibial edema, full range of motion is present. Skin is warm and dry without rash. Neurologic: Mental status is normal, cranial nerves are intact, there are no motor or sensory deficits.  ED Results / Procedures / Treatments   Labs (all labs ordered are listed, but only abnormal results are displayed) Labs Reviewed  BASIC METABOLIC PANEL - Abnormal; Notable for the following components:      Result Value   Glucose, Bld 133 (*)    BUN 31 (*)    Creatinine, Ser 1.34 (*)    GFR, Estimated 55 (*)    All other components within normal limits  BRAIN NATRIURETIC PEPTIDE -  Abnormal; Notable for the following components:   B Natriuretic Peptide 172.0 (*)    All other components within normal limits  CBC WITH DIFFERENTIAL/PLATELET - Abnormal; Notable for the following components:   WBC 18.4 (*)    Neutro Abs 16.9 (*)    Lymphs Abs 0.6 (*)    Abs Immature Granulocytes 0.10 (*)    All other components within normal limits  TROPONIN I (HIGH SENSITIVITY) - Abnormal; Notable for the following components:   Troponin I (High Sensitivity) 29 (*)    All other components within normal limits  TROPONIN I (HIGH SENSITIVITY)    EKG EKG Interpretation  Date/Time:  Wednesday September 08 2020 04:02:17 EDT Ventricular Rate:  85 PR Interval:    QRS Duration: 91 QT Interval:  394 QTC Calculation: 469 R Axis:   54 Text Interpretation: Sinus rhythm Consider left atrial enlargement Anteroseptal infarct, old Nonspecific repol abnormality, lateral leads When compared with ECG of 10/28/2017, No significant change was found Confirmed by Dione Booze ([id number]) on 09/08/2020 4:09:21 AM   Radiology DG Chest Port 1 View  Result Date: 09/08/2020 CLINICAL DATA:  Shortness of breath.  Recent diagnosis of pneumonia. EXAM: PORTABLE CHEST 1 VIEW COMPARISON:  One-view chest x-ray 09/06/2020 at [facility] [place]. FINDINGS: Heart is enlarged. Pacing wire is stable. Chronic interstitial coarsening is again noted.  Superimposed airspace disease has improved. Mild interstitial asymmetry remains on the right. Small right pleural effusion is suspected. IMPRESSION: 1. Improved aeration with persistent mild interstitial asymmetry on the right and probable small right pleural effusion. 2. Stable cardiomegaly. Electronically Signed   By: Marin Roberts M.D.   On: 09/08/2020 04:42    Procedures Procedures  Medications Ordered in ED Medications  furosemide (LASIX) injection 80 mg (has no administration in time range)  ipratropium-albuterol (DUONEB) 0.5-2.5 (3) MG/3ML nebulizer solution 3 mL (3 mLs Nebulization Given 09/08/20 0438)    ED Course  I have reviewed the triage vital signs and the nursing notes.  Pertinent labs & imaging results that were available during my care of the patient were reviewed by me and considered in my medical decision making (see chart for details).  MDM Rules/Calculators/A&P Acute dyspnea.  Patient is clearly fluid overloaded, and I suspect that dyspnea is mainly from heart failure.  However, there is some wheezing which could indicate a component of bronchitis/pneumonia.  Old records are reviewed confirming admissions to [facility] on 10/11, discharged on 10/12.  During hospital stay, BNP was elevated, troponin was mildly elevated, respiratory virus panel was negative including negative for COVID-19 and influenza.  With negative respiratory panel yesterday, do not need to repeat that.  Will get chest x-ray and screening labs.  He will be given albuterol with ipratropium via nebulizer.  Chest x-ray looks improved compared with x-ray on admission to [facility]. Creatinine is decreased compared with recent. BNP is elevated but not as high as it was on admission to [facility]. Troponin is also trending downward. He was resting comfortably throughout his ED stay. He was taken off of oxygen and maintained good oxygen saturations. He was ambulated in the emergency department  without any oxygen desaturation. He still appears to be fluid overloaded, but does seem to be safe to go home. He did not notice any improvement with nebulizer treatment in the ED. He is given a dose of furosemide and is advised to double his torsemide dose for the next 3 days, then resume his normal dose. He is given a prescription for  an albuterol inhaler. He is to follow-up with his cardiologist in the next several days. Return precautions discussed.  Final Clinical Impression(s) / ED Diagnoses Final diagnoses:  SOB (shortness of breath)  Acute on chronic clinical systolic heart failure (HCC)  Renal insufficiency    Rx / DC Orders ED Discharge Orders         Ordered    albuterol (VENTOLIN HFA) 108 (90 Base) MCG/ACT inhaler  Every 4 hours PRN        09/08/20 0651           Dione Booze, MD 09/08/20 [phone]

## 2020-09-08 NOTE — ED Notes (Signed)
Assumed care of pt at 0700 

## 2020-09-08 NOTE — ED Triage Notes (Signed)
[facility] - pt brought in from home with c/o SOB. Pt was recently was diagnosed with pneumonia. Albuterol tx en route

## 2020-09-08 NOTE — Telephone Encounter (Signed)
Rx request sent to pharmacy.  

## 2020-09-08 NOTE — ED Notes (Signed)
Pt ambulated around nursing station. O2 93-95% during walk. Back to 100% upon sitting back down HR 88-2 RR 40-44 while ambulating

## 2020-09-09 ENCOUNTER — Other Ambulatory Visit: Payer: Self-pay

## 2020-09-09 ENCOUNTER — Ambulatory Visit: Payer: Medicare HMO | Admitting: Student

## 2020-09-09 ENCOUNTER — Other Ambulatory Visit: Payer: Self-pay | Admitting: Family Medicine

## 2020-09-09 ENCOUNTER — Encounter: Payer: Self-pay | Admitting: Student

## 2020-09-09 VITALS — BP 112/62 | HR 60 | Ht 71.0 in | Wt 189.0 lb

## 2020-09-09 DIAGNOSIS — I10 Essential (primary) hypertension: Secondary | ICD-10-CM | POA: Diagnosis not present

## 2020-09-09 DIAGNOSIS — I25118 Atherosclerotic heart disease of native coronary artery with other forms of angina pectoris: Secondary | ICD-10-CM | POA: Diagnosis not present

## 2020-09-09 DIAGNOSIS — Z95810 Presence of automatic (implantable) cardiac defibrillator: Secondary | ICD-10-CM | POA: Diagnosis not present

## 2020-09-09 DIAGNOSIS — I255 Ischemic cardiomyopathy: Secondary | ICD-10-CM

## 2020-09-09 DIAGNOSIS — N1831 Chronic kidney disease, stage 3a: Secondary | ICD-10-CM | POA: Diagnosis not present

## 2020-09-09 DIAGNOSIS — I5042 Chronic combined systolic (congestive) and diastolic (congestive) heart failure: Secondary | ICD-10-CM

## 2020-09-09 DIAGNOSIS — E785 Hyperlipidemia, unspecified: Secondary | ICD-10-CM | POA: Diagnosis not present

## 2020-09-09 MED ORDER — METOPROLOL SUCCINATE ER 100 MG PO TB24: 100.0000 mg | ORAL_TABLET | Freq: Every day | ORAL | 3 refills | Status: DC

## 2020-09-09 NOTE — Patient Instructions (Signed)
Medication Instructions:  Your physician has recommended you make the following change in your medication:   Stop Taking Lopressor  Start Taking Toprol XL 100 mg Daily   *If you need a refill on your cardiac medications before your next appointment, please call your pharmacy*   Lab Work: NONE   If you have labs (blood work) drawn today and your tests are completely normal, you will receive your results only by:  MyChart Message (if you have MyChart) OR  A paper copy in the mail If you have any lab test that is abnormal or we need to change your treatment, we will call you to review the results.   Testing/Procedures: NONE   Follow-Up: At [facility], you and your health needs are our priority.  As part of our continuing mission to provide you with exceptional heart care, we have created designated Provider Care Teams.  These Care Teams include your primary Cardiologist (physician) and Advanced Practice Providers (APPs -  Physician Assistants and Nurse Practitioners) who all work together to provide you with the care you need, when you need it.  We recommend signing up for the patient portal called "MyChart".  Sign up information is provided on this After Visit Summary.  MyChart is used to connect with patients for Virtual Visits (Telemedicine).  Patients are able to view lab/test results, encounter notes, upcoming appointments, etc.  Non-urgent messages can be sent to your provider as well.   To learn more about what you can do with MyChart, go to [email or web address].    Your next appointment:   2 month(s)  The format for your next appointment:   In Person  Provider:   Randall An, PA-C or Rollene Rotunda, MD    Other Instructions Please call office in regards to having stress test or heart cath.

## 2020-09-09 NOTE — Progress Notes (Addendum)
Cardiology Office Note    Date:  09/14/2020   ID:  Joseph, [name] 24-May-1956, MRN [id number]  PCP:  [name], Elige Radon, MD  Cardiologist: Rollene Rotunda, MD   EP: Dr. Ladona Ridgel  Chief Complaint  Patient presents with  . Follow-up    Recent admission and Emergency Dept visit    History of Present Illness:    Joseph [name] is a 64 y.o. male with past medical history of CAD (s/p CABG in 2000, NST in 05/2008 showing scar with no ischemia, patent grafts in 2011), chronic combined systolic and diastolic CHF (EF 71-21% in 2016, at 35-40% by echo in 10/2017 and 30-35% in 01/2019, s/p St. Jude ICD placement in 2011), HTN, HLD, Type 2 DM (diet-controlled) and Stage 3 CKD who presents to the office today for Emergency Department follow-up.   He was last examined by Dr. Antoine Poche in 03/2020 and denied any recent chest pain or dyspnea at that time. Weight was stable at 189 lbs. Was continued on his current medication regimen with ASA 81mg  daily, Atorvastatin 80mg  daily, Enalapril 20mg  BID (previously intolerant to Entresto despite multiple attempts), Lopressor 50mg  TID and Torsemide 40mg  daily.   By review of Care Everywhere, he was admitted at [facility] from 10/11 - 09/07/2020 for evaluation of worsening dyspnea secondary to an acute CHF exacerbation. Was initially thought he had PNA and he was started on antibiotic treatment with Rocephin and Azithromycin. HS Troponin values were at 28, 37, 45 and 73. Pro-BNP 572. CXR showed asymmetric airspace disease with right greater than left pleural effusions. He did have a repeat echocardiogram with the report showing an EF of 35 to 40% with grade 1 diastolic dysfunction, mild MR and normal RV function. By review of notes, he was given IV Lasix during admission and discharged on Torsemide 40 mg daily. Creatinine was at 1.41 on 09/07/2020.  He presented to [facility] ED on 09/08/2020 for evaluation of progressive dyspnea. Also reported a nonproductive  cough and orthopnea. Repeat labs showed WBC 18.4, Hgb 14.0, platelets 229, Na+ 138, K+ 4.5 and creatinine 1.34. BNP 172 and HS troponin 29. Repeat CXR showed persistent mild interstitial asymmetry and a small right pleural effusion. He was discharged home and advised to double his Torsemide for 3 days and follow-up with Cardiology.  In talking with the patient and his daughter today, he reports improvement in his symptoms since Torsemide was titrated yesterday. He says that he had developed acute worsening orthopnea on the night he went to [facility] and also noticed worsening abdominal distention. Weight had been stable on his home scales. He reports frequent urination with Torsemide and denies any recurrent abdominal distention or edema. No recent exertional chest pain or dyspnea on exertion.  He does add salt to his food and eats at local restaurants regularly.  Past Medical History:  Diagnosis Date  . CHF (congestive heart failure) (HCC)    a. EF 25-30% in 2016 b. 35-40% by echo in 10/2017  . COPD (chronic obstructive pulmonary disease) (HCC)   . Coronary artery disease   . GI bleed    Mallory-Weiss tear EGD 02/2010  . Hyperlipidemia   . Hypertension   . Ileus (HCC) 2006   required hospitalization  . Left ventricular dysfunction    apical thrombus  . MRSA bacteremia   . 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 Jude  . Small bowel obstruction (HCC) 2007   resolved without surgery  Past Surgical History:  Procedure Laterality Date  . basilic vein left arm resection    . CARDIAC DEFIBRILLATOR PLACEMENT    . CORONARY ARTERY BYPASS GRAFT     6 vessels, 2000  . INSERT / REPLACE / REMOVE PACEMAKER  01/05/10   ICD insertion/St. Jude single chamber    Current Medications: Outpatient Medications Prior to Visit  Medication Sig Dispense Refill  . acetaminophen (TYLENOL) 500 MG tablet Take 500 mg by mouth every 6 (six) hours as needed  for pain.    Marland Kitchen albuterol (VENTOLIN HFA) 108 (90 Base) MCG/ACT inhaler Inhale 2 puffs into the lungs every 4 (four) hours as needed for wheezing or shortness of breath. 6.7 g 0  . aspirin 81 MG tablet Take 81 mg by mouth at bedtime.     Marland Kitchen atorvastatin (LIPITOR) 80 MG tablet TAKE 1 TABLET BY MOUTH EVERY DAY IN THE EVENING 90 tablet 0  . enalapril (VASOTEC) 20 MG tablet TAKE 1 TABLET BY MOUTH TWICE A DAY 180 tablet 3  . fish oil-omega-3 fatty acids 1000 MG capsule Take 1 g by mouth 3 (three) times daily.     Marland Kitchen guaiFENesin (MUCINEX PO) Take 1 tablet by mouth as needed.     Marland Kitchen KLOR-CON M20 20 MEQ tablet TAKE 1 TABLET BY MOUTH EVERY DAY 90 tablet 3  . Multiple Vitamin (MULTIVITAMIN) tablet Take 1 tablet by mouth daily. With FeSo4    . nitroGLYCERIN (NITROSTAT) 0.4 MG SL tablet Place 1 tablet (0.4 mg total) under the tongue every 5 (five) minutes as needed for chest pain. 25 tablet 3  . omeprazole (PRILOSEC) 20 MG capsule TAKE 1 CAPSULE BY MOUTH EVERY DAY 90 capsule 3  . metoprolol tartrate (LOPRESSOR) 50 MG tablet TAKE 1 TABLET BY MOUTH THREE TIMES A DAY 270 tablet 1  . torsemide (DEMADEX) 20 MG tablet TAKE 2 TABLETS BY MOUTH EVERY DAY 180 tablet 0  . ferrous sulfate 325 (65 FE) MG tablet TAKE 1 TABLET BY MOUTH EVERY DAY WITH BREAKFAST 90 tablet 0   No facility-administered medications prior to visit.     Allergies:   Entresto [sacubitril-valsartan]   Social History   Socioeconomic History  . Marital status: Married    Spouse name: Kathie Rhodes  . Number of children: 3  . Years of education: GED  . Highest education level: Not on file  Occupational History  . 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 Smoking status: Former Smoker    Types: Cigarettes    Start date: 11/28/1967    Quit date: 02/29/1988    Years since quitting: 32.5  . Smokeless tobacco: Never Used  Vaping Use  . Vaping Use: Never used  Substance and Sexual Activity  . Alcohol use: No    Alcohol/week: 0.0  standard drinks  . Drug use: No  . Sexual activity: Never  Other Topics Concern  . Not on file  Social History Narrative   1 son died age 27-Lee's syndrome         Social Determinants of Health   Financial Resource Strain: Low Risk   . Difficulty of Paying Living Expenses: Not very hard  Food Insecurity: No Food Insecurity  . Worried About Programme researcher, broadcasting/film/video in the Last Year: Never true  . Ran Out of Food in the Last Year: Never true  Transportation Needs: No Transportation Needs  . Lack of Transportation (Medical): No  . Lack of Transportation (Non-Medical): No  Physical  Activity: Insufficiently Active  . Days of Exercise per Week: 3 days  . Minutes of Exercise per Session: 30 min  Stress: No Stress Concern Present  . Feeling of Stress : Only a little  Social Connections: Socially Integrated  . Frequency of Communication with Friends and Family: More than three times a week  . Frequency of Social Gatherings with Friends and Family: More than three times a week  . Attends Religious Services: More than 4 times per year  . Active Member of Clubs or Organizations: Yes  . Attends Banker Meetings: More than 4 times per year  . Marital Status: Married     Family History:  The patient's family history includes Coronary artery disease in his father; Emphysema in his father; Heart attack in his mother.   Review of Systems:   Please see the history of present illness.     General:  No chills, fever, night sweats or weight changes.  Cardiovascular:  No chest pain or palpitations. Positive for PND, orthopnea and dyspnea.  Dermatological: No rash, lesions/masses Respiratory: No cough, dyspnea Urologic: No hematuria, dysuria Abdominal:   No nausea, vomiting, diarrhea, bright red blood per rectum, melena, or hematemesis Neurologic:  No visual changes, wkns, changes in mental status. All other systems reviewed and are otherwise negative except as noted above.   Physical  Exam:    VS:  BP 112/62   Pulse 60   Ht  (1.803 m)   Wt 189 lb (85.7 kg)   SpO2 96%   BMI 26.36 kg/m    General: Well developed, well nourished,male appearing in no acute distress. Head: Normocephalic, atraumatic. Neck: No carotid bruits. JVD not elevated.  Lungs: Respirations regular and unlabored, mildly decreased breath sounds along bases bilaterally Heart: Regular rate and rhythm. No S3 or S4.  No murmur, no rubs, or gallops appreciated. Abdomen: Appears non-distended. No obvious abdominal masses. Msk:  Strength and tone appear normal for age. No obvious joint deformities or effusions. Extremities: No clubbing or cyanosis. Trace lower extremity edema.  Distal pedal pulses are 2+ bilaterally. Neuro: Alert and oriented X 3. Moves all extremities spontaneously. No focal deficits noted. Psych:  Responds to questions appropriately with a normal affect. Skin: No rashes or lesions noted  Wt Readings from Last 3 Encounters:  09/13/20 182 lb (82.6 kg)  09/09/20 189 lb (85.7 kg)  09/08/20 180 lb (81.6 kg)     Studies/Labs Reviewed:   EKG:  EKG is not ordered today.   Recent Labs: 09/08/2020: B Natriuretic Peptide 172.0 09/13/2020: ALT 647; BUN 23; Creatinine, Ser 1.18; Hemoglobin 13.8; Platelets 215; Potassium 4.6; Sodium 139   Lipid Panel    Component Value Date/Time   CHOL 141 08/19/2020 0820   TRIG 103 08/19/2020 0820   HDL 42 08/19/2020 0820   CHOLHDL 3.4 08/19/2020 0820   CHOLHDL 3.2 03/08/2010 0411   VLDL 10 03/08/2010 0411   LDLCALC 80 08/19/2020 0820    Additional studies/ records that were reviewed today include:   Echocardiogram: 01/2019 IMPRESSIONS    1. The left ventricle has moderate-severely reduced systolic function,  with an ejection fraction of 30-35%. The cavity size was mildly dilated.  There is mildly increased left ventricular wall thickness. Diastolic  function abnormal, indeterminant grade.  2. Diffuse hypokinesis, the anteroseptal  wall is akinetic.  3. Left atrial size was severely dilated.  4. Right atrial size was moderately dilated.  5. The mitral valve is normal in structure. No evidence of  mitral valve  stenosis.  6. The tricuspid valve is normal in structure.  7. The aortic valve is tricuspid no stenosis of the aortic valve.  8. The aortic root is normal in size and structure.  9. Pulmonary hypertension is is normal, PASP is 28 mmHg.  10. The interatrial septum appears to be lipomatous.  11. The interatrial septum was not assessed.   Echocardiogram: 09/07/2020 Summary  1. The left ventricle is mildly dilated in size with mildly increased wall  thickness.  2. The left ventricular systolic function is moderately decreased, LVEF is  visually estimated at 35-40%.  3. There is grade I diastolic dysfunction (impaired relaxation).  4. There is mild mitral valve regurgitation.  5. The left atrium is mildly to moderately dilated in size.  6. The right ventricle is normal in size, with normal systolic function.    Assessment:    1. Chronic combined systolic and diastolic heart failure (HCC)   2. Cardiomyopathy, ischemic   3. Coronary artery disease of native artery of native heart with stable angina pectoris (HCC)   4. ICD (implantable cardioverter-defibrillator) in place   5. Essential hypertension   6. Hyperlipidemia LDL goal <70   7. Stage 3a chronic kidney disease (HCC)      Plan:   In order of problems listed above:  1. Chronic Combined Systolic and Diastolic CHF/ICM - He has a known reduced EF of 30 to 35% by echocardiogram in 01/2019 and this was at 35 to 40% by repeat imaging at [facility] earlier this week. His episode of orthopnea and PND sounds most consistent with flash pulmonary edema. His symptoms have improved with titration of Torsemide from 40 mg daily to 40 mg twice daily. He will continue on this dosing until Saturday then resume 40 mg daily with instructions to  take an extra tablet as needed for worsening edema or weight gain. - He is currently on Enalapril 20 mg twice daily and Lopressor 50 mg 3 times daily. He was previously intolerant to [place] despite multiple attempts. Given his cardiomyopathy, will transition Lopressor to Toprol-XL. He does report intermittent episodes of bradycardia with heart rate in the 40's at times, therefore will reduce dosing to Toprol-XL 100 mg daily. Pending reassessment of BP, would consider the addition of Spironolactone. - We did discuss repeat ischemic evaluation in regards to a stress test or R/LHC. He is unsure if he would like to pursue a cardiac catheterization at this time given that a family member recently experienced complications with the procedure. He plans to discuss this further with family and will call back to let us know his decision. His case was reviewed with Dr. Diona Browner who talked with the patient today as well and he is in agreement with [facility] if the patient wishes to pursue this and recommended it be performed with Dr. Gala Romney or Dr. Shirlee Latch if scheduling permits.    ADDENDUM: 09/14/2020: Made aware by patient's daughter he is now in agreement to proceed with a cardiac catheterization. Risks and benefits previously reviewed at his office visit and the patient understands that risks include but are not limited to stroke (1 in 1000), death (1 in 1000), kidney failure [usually temporary] (1 in 500), bleeding (1 in 200), allergic reaction [possibly serious] (1 in 200). Scheduled for 09/23/2020 per the patient and family's request. Will need routine labs along with COVID testing prior to the procedure.    2. CAD - He is s/p CABG in 2000 with NST in 05/2008  showing scar with no ischemia and patent grafts by repeat cath in 2011. He denies any recent chest pain and says his dyspnea has improved following adjustments in diuretic therapy. Reviewed options in regards to repeat ischemic evaluation with the patient  and his daughter today. He is undecided on which option he prefers and plans to discuss this with further family members and will call back to let us know what he has decided. - Continue ASA 81 mg daily, Lipitor 80 mg daily and beta-blocker therapy. Will adjust Lopressor to Toprol-XL as outlined above.  3. ICD in place - He is s/p St. Jude ICD placement in 2011. His device is followed by Dr. Ladona Ridgel. Scheduled for follow-up in 09/2020.  4. HTN - BP is well controlled at 112/62 during today's visit. Will continue Enalapril 20 mg twice daily and transition Lopressor to Toprol-XL as outlined above.  5. HLD - Followed by PCP. LDL was at 68 in 06/2019. He remains on Atorvastatin 80 mg daily and Fish Oil.  6. Stage 3 CKD - Baseline creatinine 1.3-1.4. Stable at 1.34 by recent labs on 09/08/2020.  ), HTN, HLD, Type 2 DM (diet-controlled) and Stage 3 CKD who presents to the office today for Emergency Department follow-up.    Medication Adjustments/Labs and Tests Ordered: Current medicines are reviewed at length with the patient today.  Concerns regarding medicines are outlined above.  Medication changes, Labs and Tests ordered today are listed in the Patient Instructions below. Patient Instructions  Medication Instructions:  Your physician has recommended you make the following change in your medication:   Stop Taking Lopressor  Start Taking Toprol XL 100 mg Daily   *If you need a refill on your cardiac medications before your next appointment, please call your pharmacy*   Lab Work: NONE   If you have labs (blood work) drawn today and your tests are completely normal, you will receive your results only by: Marland Kitchen MyChart Message (if you have MyChart) OR . A paper copy in the mail If you have any lab test that is abnormal or we need to change your treatment, we will call you to review the results.   Testing/Procedures: NONE   Follow-Up: At [facility], you and your health needs are our  priority.  As part of our continuing mission to provide you with exceptional heart care, we have created designated Provider Care Teams.  These Care Teams include your primary Cardiologist (physician) and Advanced Practice Providers (APPs -  Physician Assistants and Nurse Practitioners) who all work together to provide you with the care you need, when you need it.  We recommend signing up for the patient portal called "MyChart".  Sign up information is provided on this After Visit Summary.  MyChart is used to connect with patients for Virtual Visits (Telemedicine).  Patients are able to view lab/test results, encounter notes, upcoming appointments, etc.  Non-urgent messages can be sent to your provider as well.   To learn more about what you can do with MyChart, go to [email or web address].    Your next appointment:   2 month(s)  The format for your next appointment:   In Person  Provider:   Randall An, PA-C or Rollene Rotunda, MD    Other Instructions Please call office in regards to having stress test or heart cath.   Signed, Joseph Lennox, PA-C  09/14/2020 11:43 AM    [facility] [address] [place], [place] [address] Phone: [phone] Fax: [phone])  [phone]   

## 2020-09-09 NOTE — H&P (View-Only) (Signed)
Cardiology Office Note    Date:  09/14/2020   ID:  Joseph, [name] 24-May-1956, MRN [id number]  PCP:  [name], Elige Radon, MD  Cardiologist: Rollene Rotunda, MD   EP: Dr. Ladona Ridgel  Chief Complaint  Patient presents with  . Follow-up    Recent admission and Emergency Dept visit    History of Present Illness:    Joseph [name] is a 64 y.o. male with past medical history of CAD (s/p CABG in 2000, NST in 05/2008 showing scar with no ischemia, patent grafts in 2011), chronic combined systolic and diastolic CHF (EF 71-21% in 2016, at 35-40% by echo in 10/2017 and 30-35% in 01/2019, s/p St. Jude ICD placement in 2011), HTN, HLD, Type 2 DM (diet-controlled) and Stage 3 CKD who presents to the office today for Emergency Department follow-up.   He was last examined by Dr. Antoine Poche in 03/2020 and denied any recent chest pain or dyspnea at that time. Weight was stable at 189 lbs. Was continued on his current medication regimen with ASA 81mg  daily, Atorvastatin 80mg  daily, Enalapril 20mg  BID (previously intolerant to Entresto despite multiple attempts), Lopressor 50mg  TID and Torsemide 40mg  daily.   By review of Care Everywhere, he was admitted at [facility] from 10/11 - 09/07/2020 for evaluation of worsening dyspnea secondary to an acute CHF exacerbation. Was initially thought he had PNA and he was started on antibiotic treatment with Rocephin and Azithromycin. HS Troponin values were at 28, 37, 45 and 73. Pro-BNP 572. CXR showed asymmetric airspace disease with right greater than left pleural effusions. He did have a repeat echocardiogram with the report showing an EF of 35 to 40% with grade 1 diastolic dysfunction, mild MR and normal RV function. By review of notes, he was given IV Lasix during admission and discharged on Torsemide 40 mg daily. Creatinine was at 1.41 on 09/07/2020.  He presented to [facility] ED on 09/08/2020 for evaluation of progressive dyspnea. Also reported a nonproductive  cough and orthopnea. Repeat labs showed WBC 18.4, Hgb 14.0, platelets 229, Na+ 138, K+ 4.5 and creatinine 1.34. BNP 172 and HS troponin 29. Repeat CXR showed persistent mild interstitial asymmetry and a small right pleural effusion. He was discharged home and advised to double his Torsemide for 3 days and follow-up with Cardiology.  In talking with the patient and his daughter today, he reports improvement in his symptoms since Torsemide was titrated yesterday. He says that he had developed acute worsening orthopnea on the night he went to [facility] and also noticed worsening abdominal distention. Weight had been stable on his home scales. He reports frequent urination with Torsemide and denies any recurrent abdominal distention or edema. No recent exertional chest pain or dyspnea on exertion.  He does add salt to his food and eats at local restaurants regularly.  Past Medical History:  Diagnosis Date  . CHF (congestive heart failure) (HCC)    a. EF 25-30% in 2016 b. 35-40% by echo in 10/2017  . COPD (chronic obstructive pulmonary disease) (HCC)   . Coronary artery disease   . GI bleed    Mallory-Weiss tear EGD 02/2010  . Hyperlipidemia   . Hypertension   . Ileus (HCC) 2006   required hospitalization  . Left ventricular dysfunction    apical thrombus  . MRSA bacteremia   . 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 Jude  . Small bowel obstruction (HCC) 2007   resolved without surgery  Past Surgical History:  Procedure Laterality Date  . basilic vein left arm resection    . CARDIAC DEFIBRILLATOR PLACEMENT    . CORONARY ARTERY BYPASS GRAFT     6 vessels, 2000  . INSERT / REPLACE / REMOVE PACEMAKER  01/05/10   ICD insertion/St. Jude single chamber    Current Medications: Outpatient Medications Prior to Visit  Medication Sig Dispense Refill  . acetaminophen (TYLENOL) 500 MG tablet Take 500 mg by mouth every 6 (six) hours as needed  for pain.    Marland Kitchen albuterol (VENTOLIN HFA) 108 (90 Base) MCG/ACT inhaler Inhale 2 puffs into the lungs every 4 (four) hours as needed for wheezing or shortness of breath. 6.7 g 0  . aspirin 81 MG tablet Take 81 mg by mouth at bedtime.     Marland Kitchen atorvastatin (LIPITOR) 80 MG tablet TAKE 1 TABLET BY MOUTH EVERY DAY IN THE EVENING 90 tablet 0  . enalapril (VASOTEC) 20 MG tablet TAKE 1 TABLET BY MOUTH TWICE A DAY 180 tablet 3  . fish oil-omega-3 fatty acids 1000 MG capsule Take 1 g by mouth 3 (three) times daily.     Marland Kitchen guaiFENesin (MUCINEX PO) Take 1 tablet by mouth as needed.     Marland Kitchen KLOR-CON M20 20 MEQ tablet TAKE 1 TABLET BY MOUTH EVERY DAY 90 tablet 3  . Multiple Vitamin (MULTIVITAMIN) tablet Take 1 tablet by mouth daily. With FeSo4    . nitroGLYCERIN (NITROSTAT) 0.4 MG SL tablet Place 1 tablet (0.4 mg total) under the tongue every 5 (five) minutes as needed for chest pain. 25 tablet 3  . omeprazole (PRILOSEC) 20 MG capsule TAKE 1 CAPSULE BY MOUTH EVERY DAY 90 capsule 3  . metoprolol tartrate (LOPRESSOR) 50 MG tablet TAKE 1 TABLET BY MOUTH THREE TIMES A DAY 270 tablet 1  . torsemide (DEMADEX) 20 MG tablet TAKE 2 TABLETS BY MOUTH EVERY DAY 180 tablet 0  . ferrous sulfate 325 (65 FE) MG tablet TAKE 1 TABLET BY MOUTH EVERY DAY WITH BREAKFAST 90 tablet 0   No facility-administered medications prior to visit.     Allergies:   Entresto [sacubitril-valsartan]   Social History   Socioeconomic History  . Marital status: Married    Spouse name: Kathie Rhodes  . Number of children: 3  . Years of education: GED  . Highest education level: Not on file  Occupational History  . 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 Smoking status: Former Smoker    Types: Cigarettes    Start date: 11/28/1967    Quit date: 02/29/1988    Years since quitting: 32.5  . Smokeless tobacco: Never Used  Vaping Use  . Vaping Use: Never used  Substance and Sexual Activity  . Alcohol use: No    Alcohol/week: 0.0  standard drinks  . Drug use: No  . Sexual activity: Never  Other Topics Concern  . Not on file  Social History Narrative   1 son died age 27-Lee's syndrome         Social Determinants of Health   Financial Resource Strain: Low Risk   . Difficulty of Paying Living Expenses: Not very hard  Food Insecurity: No Food Insecurity  . Worried About Programme researcher, broadcasting/film/video in the Last Year: Never true  . Ran Out of Food in the Last Year: Never true  Transportation Needs: No Transportation Needs  . Lack of Transportation (Medical): No  . Lack of Transportation (Non-Medical): No  Physical  Activity: Insufficiently Active  . Days of Exercise per Week: 3 days  . Minutes of Exercise per Session: 30 min  Stress: No Stress Concern Present  . Feeling of Stress : Only a little  Social Connections: Socially Integrated  . Frequency of Communication with Friends and Family: More than three times a week  . Frequency of Social Gatherings with Friends and Family: More than three times a week  . Attends Religious Services: More than 4 times per year  . Active Member of Clubs or Organizations: Yes  . Attends Club or Organization Meetings: More than 4 times per year  . Marital Status: Married     Family History:  The patient's family history includes Coronary artery disease in his father; Emphysema in his father; Heart attack in his mother.   Review of Systems:   Please see the history of present illness.     General:  No chills, fever, night sweats or weight changes.  Cardiovascular:  No chest pain or palpitations. Positive for PND, orthopnea and dyspnea.  Dermatological: No rash, lesions/masses Respiratory: No cough, dyspnea Urologic: No hematuria, dysuria Abdominal:   No nausea, vomiting, diarrhea, bright red blood per rectum, melena, or hematemesis Neurologic:  No visual changes, wkns, changes in mental status. All other systems reviewed and are otherwise negative except as noted above.   Physical  Exam:    VS:  BP 112/62   Pulse 60   Ht 5' 11" (1.803 m)   Wt 189 lb (85.7 kg)   SpO2 96%   BMI 26.36 kg/m    General: Well developed, well nourished,male appearing in no acute distress. Head: Normocephalic, atraumatic. Neck: No carotid bruits. JVD not elevated.  Lungs: Respirations regular and unlabored, mildly decreased breath sounds along bases bilaterally Heart: Regular rate and rhythm. No S3 or S4.  No murmur, no rubs, or gallops appreciated. Abdomen: Appears non-distended. No obvious abdominal masses. Msk:  Strength and tone appear normal for age. No obvious joint deformities or effusions. Extremities: No clubbing or cyanosis. Trace lower extremity edema.  Distal pedal pulses are 2+ bilaterally. Neuro: Alert and oriented X 3. Moves all extremities spontaneously. No focal deficits noted. Psych:  Responds to questions appropriately with a normal affect. Skin: No rashes or lesions noted  Wt Readings from Last 3 Encounters:  09/13/20 182 lb (82.6 kg)  09/09/20 189 lb (85.7 kg)  09/08/20 180 lb (81.6 kg)     Studies/Labs Reviewed:   EKG:  EKG is not ordered today.   Recent Labs: 09/08/2020: B Natriuretic Peptide 172.0 09/13/2020: ALT 647; BUN 23; Creatinine, Ser 1.18; Hemoglobin 13.8; Platelets 215; Potassium 4.6; Sodium 139   Lipid Panel    Component Value Date/Time   CHOL 141 08/19/2020 0820   TRIG 103 08/19/2020 0820   HDL 42 08/19/2020 0820   CHOLHDL 3.4 08/19/2020 0820   CHOLHDL 3.2 03/08/2010 0411   VLDL 10 03/08/2010 0411   LDLCALC 80 08/19/2020 0820    Additional studies/ records that were reviewed today include:   Echocardiogram: 01/2019 IMPRESSIONS    1. The left ventricle has moderate-severely reduced systolic function,  with an ejection fraction of 30-35%. The cavity size was mildly dilated.  There is mildly increased left ventricular wall thickness. Diastolic  function abnormal, indeterminant grade.  2. Diffuse hypokinesis, the anteroseptal  wall is akinetic.  3. Left atrial size was severely dilated.  4. Right atrial size was moderately dilated.  5. The mitral valve is normal in structure. No evidence of   mitral valve  stenosis.  6. The tricuspid valve is normal in structure.  7. The aortic valve is tricuspid no stenosis of the aortic valve.  8. The aortic root is normal in size and structure.  9. Pulmonary hypertension is is normal, PASP is 28 mmHg.  10. The interatrial septum appears to be lipomatous.  11. The interatrial septum was not assessed.   Echocardiogram: 09/07/2020 Summary  1. The left ventricle is mildly dilated in size with mildly increased wall  thickness.  2. The left ventricular systolic function is moderately decreased, LVEF is  visually estimated at 35-40%.  3. There is grade I diastolic dysfunction (impaired relaxation).  4. There is mild mitral valve regurgitation.  5. The left atrium is mildly to moderately dilated in size.  6. The right ventricle is normal in size, with normal systolic function.    Assessment:    1. Chronic combined systolic and diastolic heart failure (HCC)   2. Cardiomyopathy, ischemic   3. Coronary artery disease of native artery of native heart with stable angina pectoris (HCC)   4. ICD (implantable cardioverter-defibrillator) in place   5. Essential hypertension   6. Hyperlipidemia LDL goal <70   7. Stage 3a chronic kidney disease (HCC)      Plan:   In order of problems listed above:  1. Chronic Combined Systolic and Diastolic CHF/ICM - He has a known reduced EF of 30 to 35% by echocardiogram in 01/2019 and this was at 35 to 40% by repeat imaging at [facility] earlier this week. His episode of orthopnea and PND sounds most consistent with flash pulmonary edema. His symptoms have improved with titration of Torsemide from 40 mg daily to 40 mg twice daily. He will continue on this dosing until Saturday then resume 40 mg daily with instructions to  take an extra tablet as needed for worsening edema or weight gain. - He is currently on Enalapril 20 mg twice daily and Lopressor 50 mg 3 times daily. He was previously intolerant to [place] despite multiple attempts. Given his cardiomyopathy, will transition Lopressor to Toprol-XL. He does report intermittent episodes of bradycardia with heart rate in the 40's at times, therefore will reduce dosing to Toprol-XL 100 mg daily. Pending reassessment of BP, would consider the addition of Spironolactone. - We did discuss repeat ischemic evaluation in regards to a stress test or R/LHC. He is unsure if he would like to pursue a cardiac catheterization at this time given that a family member recently experienced complications with the procedure. He plans to discuss this further with family and will call back to let us know his decision. His case was reviewed with Dr. Diona Browner who talked with the patient today as well and he is in agreement with [facility] if the patient wishes to pursue this and recommended it be performed with Dr. Gala Romney or Dr. Shirlee Latch if scheduling permits.    ADDENDUM: 09/14/2020: Made aware by patient's daughter he is now in agreement to proceed with a cardiac catheterization. Risks and benefits previously reviewed at his office visit and the patient understands that risks include but are not limited to stroke (1 in 1000), death (1 in 1000), kidney failure [usually temporary] (1 in 500), bleeding (1 in 200), allergic reaction [possibly serious] (1 in 200). Scheduled for 09/23/2020 per the patient and family's request. Will need routine labs along with COVID testing prior to the procedure.    2. CAD - He is s/p CABG in 2000 with NST in 05/2008  showing scar with no ischemia and patent grafts by repeat cath in 2011. He denies any recent chest pain and says his dyspnea has improved following adjustments in diuretic therapy. Reviewed options in regards to repeat ischemic evaluation with the patient  and his daughter today. He is undecided on which option he prefers and plans to discuss this with further family members and will call back to let us know what he has decided. - Continue ASA 81 mg daily, Lipitor 80 mg daily and beta-blocker therapy. Will adjust Lopressor to Toprol-XL as outlined above.  3. ICD in place - He is s/p St. Jude ICD placement in 2011. His device is followed by Dr. Ladona Ridgel. Scheduled for follow-up in 09/2020.  4. HTN - BP is well controlled at 112/62 during today's visit. Will continue Enalapril 20 mg twice daily and transition Lopressor to Toprol-XL as outlined above.  5. HLD - Followed by PCP. LDL was at 68 in 06/2019. He remains on Atorvastatin 80 mg daily and Fish Oil.  6. Stage 3 CKD - Baseline creatinine 1.3-1.4. Stable at 1.34 by recent labs on 09/08/2020.  ), HTN, HLD, Type 2 DM (diet-controlled) and Stage 3 CKD who presents to the office today for Emergency Department follow-up.    Medication Adjustments/Labs and Tests Ordered: Current medicines are reviewed at length with the patient today.  Concerns regarding medicines are outlined above.  Medication changes, Labs and Tests ordered today are listed in the Patient Instructions below. Patient Instructions  Medication Instructions:  Your physician has recommended you make the following change in your medication:   Stop Taking Lopressor  Start Taking Toprol XL 100 mg Daily   *If you need a refill on your cardiac medications before your next appointment, please call your pharmacy*   Lab Work: NONE   If you have labs (blood work) drawn today and your tests are completely normal, you will receive your results only by: Marland Kitchen MyChart Message (if you have MyChart) OR . A paper copy in the mail If you have any lab test that is abnormal or we need to change your treatment, we will call you to review the results.   Testing/Procedures: NONE   Follow-Up: At [facility], you and your health needs are our  priority.  As part of our continuing mission to provide you with exceptional heart care, we have created designated Provider Care Teams.  These Care Teams include your primary Cardiologist (physician) and Advanced Practice Providers (APPs -  Physician Assistants and Nurse Practitioners) who all work together to provide you with the care you need, when you need it.  We recommend signing up for the patient portal called "MyChart".  Sign up information is provided on this After Visit Summary.  MyChart is used to connect with patients for Virtual Visits (Telemedicine).  Patients are able to view lab/test results, encounter notes, upcoming appointments, etc.  Non-urgent messages can be sent to your provider as well.   To learn more about what you can do with MyChart, go to [email or web address].    Your next appointment:   2 month(s)  The format for your next appointment:   In Person  Provider:   Randall An, PA-C or Rollene Rotunda, MD    Other Instructions Please call office in regards to having stress test or heart cath.   Signed, Ellsworth Lennox, PA-C  09/14/2020 11:43 AM    [facility] [address] [place], [place] [address] Phone: [phone] Fax: [phone])  [phone]   

## 2020-09-13 ENCOUNTER — Other Ambulatory Visit: Payer: Self-pay

## 2020-09-13 ENCOUNTER — Encounter: Payer: Self-pay | Admitting: Family Medicine

## 2020-09-13 ENCOUNTER — Ambulatory Visit (INDEPENDENT_AMBULATORY_CARE_PROVIDER_SITE_OTHER): Payer: Medicare HMO | Admitting: Family Medicine

## 2020-09-13 ENCOUNTER — Telehealth: Payer: Self-pay | Admitting: *Deleted

## 2020-09-13 ENCOUNTER — Other Ambulatory Visit: Payer: Self-pay | Admitting: Family Medicine

## 2020-09-13 VITALS — BP 135/61 | HR 81 | Temp 98.0°F | Ht 71.0 in | Wt 182.0 lb

## 2020-09-13 DIAGNOSIS — N1831 Chronic kidney disease, stage 3a: Secondary | ICD-10-CM

## 2020-09-13 DIAGNOSIS — I5022 Chronic systolic (congestive) heart failure: Secondary | ICD-10-CM | POA: Diagnosis not present

## 2020-09-13 MED ORDER — HYDROXYZINE PAMOATE 25 MG PO CAPS: 25.0000 mg | ORAL_CAPSULE | Freq: Three times a day (TID) | ORAL | 0 refills | Status: AC | PRN

## 2020-09-13 MED ORDER — TORSEMIDE 20 MG PO TABS: 20.0000 mg | ORAL_TABLET | Freq: Two times a day (BID) | ORAL | 0 refills | Status: DC

## 2020-09-13 NOTE — Telephone Encounter (Signed)
Key: [id number] - PA Case ID: [id number] - Rx #: [id number]  Drug hydrOXYzine Pamoate 25MG  capsules Form [employer] Electronic PA Form (2017 NCPDP)  Outcome Approvedtoday Your request has been approved  Pharmacy notified

## 2020-09-13 NOTE — Progress Notes (Signed)
BP 135/61   Pulse 81   Temp 98 F (36.7 C)   Ht 5' 11" (1.803 m)   Wt 182 lb (82.6 kg)   SpO2 99%   BMI 25.38 kg/m    Subjective:   Patient ID: SERAFINO BURCIAGA, male    DOB: 09-Oct-1956, 64 y.o.   MRN: [id number]  HPI: PRABHAV FAULKENBERRY is a 64 y.o. male presenting on 09/13/2020 for Hospitalization Follow-up, Congestive Heart Failure, Shortness of Breath, and Nasal Congestion   HPI CHF exacerbation Patient was in hospital on 10/11 and 10/13 for CHF and he thinks that possibly transfer a little bit pneumonia but became IV antibiotics for 2 days and then IV diuretics.  It looks like for the most part they work on the congestive heart failure with CKD.  He says he when he was feeling very short of breath but has drastically improved since then.  Patient says he is feeling like he is mostly back to normal, he still gets a little tiny bit of shortness of breath on exertion.  They did double up his torsemide for a week.  Patient was also diagnosed with CKD in the hospital, will recheck.  Relevant past medical, surgical, family and social history reviewed and updated as indicated. Interim medical history since our last visit reviewed. Allergies and medications reviewed and updated.  Review of Systems  Constitutional: Negative for chills and fever.  Eyes: Negative for visual disturbance.  Respiratory: Negative for shortness of breath and wheezing.   Cardiovascular: Negative for chest pain and leg swelling.  Musculoskeletal: Negative for back pain and gait problem.  Skin: Negative for rash.  All other systems reviewed and are negative.   Per HPI unless specifically indicated above   Allergies as of 09/13/2020      Reactions   Entresto [sacubitril-valsartan]    Worsening renal function and weight gain      Medication List       Accurate as of September 13, 2020  8:48 AM. If you have any questions, ask your nurse or doctor.        acetaminophen 500 MG tablet Commonly known as:  TYLENOL Take 500 mg by mouth every 6 (six) hours as needed for pain.   albuterol 108 (90 Base) MCG/ACT inhaler Commonly known as: VENTOLIN HFA Inhale 2 puffs into the lungs every 4 (four) hours as needed for wheezing or shortness of breath.   aspirin 81 MG tablet Take 81 mg by mouth at bedtime.   atorvastatin 80 MG tablet Commonly known as: LIPITOR TAKE 1 TABLET BY MOUTH EVERY DAY IN THE EVENING   enalapril 20 MG tablet Commonly known as: VASOTEC TAKE 1 TABLET BY MOUTH TWICE A DAY   fish oil-omega-3 fatty acids 1000 MG capsule Take 1 g by mouth 3 (three) times daily.   Klor-Con M20 20 MEQ tablet Generic drug: potassium chloride SA TAKE 1 TABLET BY MOUTH EVERY DAY   metoprolol succinate 100 MG 24 hr tablet Commonly known as: TOPROL-XL Take 1 tablet (100 mg total) by mouth daily. Take with or immediately following a meal.   MUCINEX PO Take 1 tablet by mouth as needed.   multivitamin tablet Take 1 tablet by mouth daily. With FeSo4   nitroGLYCERIN 0.4 MG SL tablet Commonly known as: NITROSTAT Place 1 tablet (0.4 mg total) under the tongue every 5 (five) minutes as needed for chest pain.   omeprazole 20 MG capsule Commonly known as: PRILOSEC TAKE 1 CAPSULE BY MOUTH EVERY  DAY   torsemide 20 MG tablet Commonly known as: DEMADEX TAKE 2 TABLETS BY MOUTH EVERY DAY        Objective:   BP 135/61   Pulse 81   Temp 98 F (36.7 C)   Ht 5' 11" (1.803 m)   Wt 182 lb (82.6 kg)   SpO2 99%   BMI 25.38 kg/m   Wt Readings from Last 3 Encounters:  09/13/20 182 lb (82.6 kg)  09/09/20 189 lb (85.7 kg)  09/08/20 180 lb (81.6 kg)    Physical Exam Vitals and nursing note reviewed.  Constitutional:      General: He is not in acute distress.    Appearance: He is well-developed. He is not diaphoretic.  Eyes:     General: No scleral icterus.    Conjunctiva/sclera: Conjunctivae normal.  Neck:     Thyroid: No thyromegaly.  Cardiovascular:     Rate and Rhythm: Normal rate  and regular rhythm.     Heart sounds: Normal heart sounds. No murmur heard.   Pulmonary:     Effort: Pulmonary effort is normal. No respiratory distress.     Breath sounds: Rales (A small amount of crackles around the heart.) present. No wheezing.  Musculoskeletal:        General: Normal range of motion.     Cervical back: Neck supple.  Lymphadenopathy:     Cervical: No cervical adenopathy.  Skin:    General: Skin is warm and dry.     Findings: No rash.  Neurological:     Mental Status: He is alert and oriented to person, place, and time.     Coordination: Coordination normal.  Psychiatric:        Behavior: Behavior normal.       Assessment & Plan:   Problem List Items Addressed This Visit      Cardiovascular and Mediastinum   Chronic systolic heart failure (HCC) - Primary   Relevant Medications   torsemide (DEMADEX) 20 MG tablet   Other Relevant Orders   CBC with Differential/Platelet   CMP14+EGFR     Genitourinary   Stage 3 chronic kidney disease (HCC)   Relevant Orders   CBC with Differential/Platelet   CMP14+EGFR      Will increase torsemide again for 3 more days told him to double up, still has a little bit of fluid although weight is coming down.  Patient also has follow-up with cardiologist and sounds like they are talking about doing a cardiac catheterization. Follow up plan: Return if symptoms worsen or fail to improve.  Counseling provided for all of the vaccine components No orders of the defined types were placed in this encounter.   Joshua [name], MD [facility] 09/13/2020, 8:48 AM     

## 2020-09-14 ENCOUNTER — Other Ambulatory Visit: Payer: Self-pay | Admitting: Student

## 2020-09-14 ENCOUNTER — Telehealth: Payer: Self-pay | Admitting: *Deleted

## 2020-09-14 ENCOUNTER — Encounter: Payer: Self-pay | Admitting: *Deleted

## 2020-09-14 DIAGNOSIS — Z01818 Encounter for other preprocedural examination: Secondary | ICD-10-CM

## 2020-09-14 LAB — CBC WITH DIFFERENTIAL/PLATELET
Basophils Absolute: 0 10*3/uL (ref 0.0–0.2)
Basos: 0 %
EOS (ABSOLUTE): 0.1 10*3/uL (ref 0.0–0.4)
Eos: 1 %
Hematocrit: 40.3 % (ref 37.5–51.0)
Hemoglobin: 13.8 g/dL (ref 13.0–17.7)
Immature Grans (Abs): 0.1 10*3/uL (ref 0.0–0.1)
Immature Granulocytes: 1 %
Lymphocytes Absolute: 1.3 10*3/uL (ref 0.7–3.1)
Lymphs: 12 %
MCH: 30.9 pg (ref 26.6–33.0)
MCHC: 34.2 g/dL (ref 31.5–35.7)
MCV: 90 fL (ref 79–97)
Monocytes Absolute: 0.6 10*3/uL (ref 0.1–0.9)
Monocytes: 5 %
Neutrophils Absolute: 9.5 10*3/uL — ABNORMAL HIGH (ref 1.4–7.0)
Neutrophils: 81 %
Platelets: 215 10*3/uL (ref 150–450)
RBC: 4.46 x10E6/uL (ref 4.14–5.80)
RDW: 12.3 % (ref 11.6–15.4)
WBC: 11.6 10*3/uL — ABNORMAL HIGH (ref 3.4–10.8)

## 2020-09-14 LAB — CMP14+EGFR
ALT: 647 IU/L (ref 0–44)
AST: 159 IU/L — ABNORMAL HIGH (ref 0–40)
Albumin/Globulin Ratio: 1.8 (ref 1.2–2.2)
Albumin: 3.8 g/dL (ref 3.8–4.8)
Alkaline Phosphatase: 308 IU/L — ABNORMAL HIGH (ref 44–121)
BUN/Creatinine Ratio: 19 (ref 10–24)
BUN: 23 mg/dL (ref 8–27)
Bilirubin Total: 0.6 mg/dL (ref 0.0–1.2)
CO2: 25 mmol/L (ref 20–29)
Calcium: 8.7 mg/dL (ref 8.6–10.2)
Chloride: 102 mmol/L (ref 96–106)
Creatinine, Ser: 1.18 mg/dL (ref 0.76–1.27)
GFR calc Af Amer: 74 mL/min/{1.73_m2} (ref >59)
GFR calc non Af Amer: 64 mL/min/{1.73_m2} (ref >59)
Globulin, Total: 2.1 g/dL (ref 1.5–4.5)
Glucose: 214 mg/dL — ABNORMAL HIGH (ref 65–99)
Potassium: 4.6 mmol/L (ref 3.5–5.2)
Sodium: 139 mmol/L (ref 134–144)
Total Protein: 5.9 g/dL — ABNORMAL LOW (ref 6.0–8.5)

## 2020-09-14 MED ORDER — METOPROLOL SUCCINATE ER 100 MG PO TB24: 100.0000 mg | ORAL_TABLET | Freq: Every day | ORAL | 3 refills | Status: AC

## 2020-09-14 NOTE — Telephone Encounter (Signed)
Pt notified of date and time of cath. Pt notified of the need to have Covid screening and labs done prior to Cath.

## 2020-09-16 ENCOUNTER — Other Ambulatory Visit: Payer: Self-pay | Admitting: Family Medicine

## 2020-09-17 ENCOUNTER — Other Ambulatory Visit: Payer: Medicare HMO

## 2020-09-17 ENCOUNTER — Other Ambulatory Visit: Payer: Self-pay

## 2020-09-17 DIAGNOSIS — N1831 Chronic kidney disease, stage 3a: Secondary | ICD-10-CM

## 2020-09-17 DIAGNOSIS — N183 Chronic kidney disease, stage 3 unspecified: Secondary | ICD-10-CM | POA: Diagnosis not present

## 2020-09-17 DIAGNOSIS — E1122 Type 2 diabetes mellitus with diabetic chronic kidney disease: Secondary | ICD-10-CM

## 2020-09-17 DIAGNOSIS — Z01818 Encounter for other preprocedural examination: Secondary | ICD-10-CM | POA: Diagnosis not present

## 2020-09-17 LAB — BAYER DCA HB A1C WAIVED: HB A1C (BAYER DCA - WAIVED): 6.2 % (ref <7.0)

## 2020-09-18 LAB — CBC
Hematocrit: 42.2 % (ref 37.5–51.0)
Hemoglobin: 14.4 g/dL (ref 13.0–17.7)
MCH: 30.3 pg (ref 26.6–33.0)
MCHC: 34.1 g/dL (ref 31.5–35.7)
MCV: 89 fL (ref 79–97)
Platelets: 271 10*3/uL (ref 150–450)
RBC: 4.76 x10E6/uL (ref 4.14–5.80)
RDW: 12.4 % (ref 11.6–15.4)
WBC: 10.9 10*3/uL — ABNORMAL HIGH (ref 3.4–10.8)

## 2020-09-18 LAB — CMP14+EGFR
ALT: 199 IU/L — ABNORMAL HIGH (ref 0–44)
AST: 33 IU/L (ref 0–40)
Albumin/Globulin Ratio: 1.7 (ref 1.2–2.2)
Albumin: 4 g/dL (ref 3.8–4.8)
Alkaline Phosphatase: 225 IU/L — ABNORMAL HIGH (ref 44–121)
BUN/Creatinine Ratio: 13 (ref 10–24)
BUN: 17 mg/dL (ref 8–27)
Bilirubin Total: 0.7 mg/dL (ref 0.0–1.2)
CO2: 25 mmol/L (ref 20–29)
Calcium: 9.2 mg/dL (ref 8.6–10.2)
Chloride: 99 mmol/L (ref 96–106)
Creatinine, Ser: 1.32 mg/dL — ABNORMAL HIGH (ref 0.76–1.27)
GFR calc Af Amer: 65 mL/min/{1.73_m2} (ref >59)
GFR calc non Af Amer: 56 mL/min/{1.73_m2} — ABNORMAL LOW (ref >59)
Globulin, Total: 2.3 g/dL (ref 1.5–4.5)
Glucose: 89 mg/dL (ref 65–99)
Potassium: 5.1 mmol/L (ref 3.5–5.2)
Sodium: 137 mmol/L (ref 134–144)
Total Protein: 6.3 g/dL (ref 6.0–8.5)

## 2020-09-18 LAB — BASIC METABOLIC PANEL
BUN/Creatinine Ratio: 13 (ref 10–24)
BUN: 17 mg/dL (ref 8–27)
CO2: 26 mmol/L (ref 20–29)
Calcium: 9.1 mg/dL (ref 8.6–10.2)
Chloride: 100 mmol/L (ref 96–106)
Creatinine, Ser: 1.32 mg/dL — ABNORMAL HIGH (ref 0.76–1.27)
GFR calc Af Amer: 65 mL/min/{1.73_m2} (ref >59)
GFR calc non Af Amer: 56 mL/min/{1.73_m2} — ABNORMAL LOW (ref >59)
Glucose: 89 mg/dL (ref 65–99)
Potassium: 5.1 mmol/L (ref 3.5–5.2)
Sodium: 139 mmol/L (ref 134–144)

## 2020-09-20 ENCOUNTER — Telehealth: Payer: Self-pay | Admitting: Cardiovascular Disease

## 2020-09-20 NOTE — Telephone Encounter (Signed)
Noted  

## 2020-09-20 NOTE — Telephone Encounter (Signed)
Benetta Spar is calling from [place] states the request has been approved for the patient's to have a catheterization. The approval from [facility] is being faxed over now. She states if there is any questions give their office a callback.

## 2020-09-21 ENCOUNTER — Other Ambulatory Visit: Payer: Self-pay

## 2020-09-21 ENCOUNTER — Telehealth: Payer: Self-pay | Admitting: Licensed Clinical Social Worker

## 2020-09-21 ENCOUNTER — Other Ambulatory Visit (HOSPITAL_COMMUNITY): Payer: Medicare HMO

## 2020-09-21 ENCOUNTER — Telehealth: Payer: Self-pay | Admitting: *Deleted

## 2020-09-21 ENCOUNTER — Other Ambulatory Visit (HOSPITAL_COMMUNITY)
Admission: RE | Admit: 2020-09-21 | Discharge: 2020-09-21 | Disposition: A | Discharge status: Home / Self Care | Payer: Medicare HMO | Source: Ambulatory Visit | Attending: Cardiovascular Disease | Admitting: Cardiovascular Disease

## 2020-09-21 DIAGNOSIS — Z20822 Contact with and (suspected) exposure to covid-19: Secondary | ICD-10-CM | POA: Insufficient documentation

## 2020-09-21 DIAGNOSIS — Z01812 Encounter for preprocedural laboratory examination: Secondary | ICD-10-CM | POA: Diagnosis not present

## 2020-09-21 LAB — SARS CORONAVIRUS 2 (TAT 6-24 HRS): SARS Coronavirus 2: NEGATIVE

## 2020-09-21 NOTE — Telephone Encounter (Addendum)
Pt contacted pre-catheterization scheduled at [facility] for: Thursday September 23, 2020 9 AM Verified arrival time and place: [facility] Main Entrance [facility]) at: 7 AM   No solid food after midnight prior to cath, clear liquids until 5 AM day of procedure.  Hold: Torsemide-PM prior/AM of procedure-GFR 56 Enalapril-PM prior/AM of procedure-GFR 56  Except hold medications AM meds can be  taken pre-cath with sips of water including: ASA 81 mg   Confirmed patient has responsible adult to drive home post procedure and be with patient first 24 hours after arriving home: yes  You are allowed ONE visitor in the waiting room during the time you are at the hospital for your procedure. Both you and your visitor must wear a mask once you enter the hospital.       COVID-19 Pre-Screening Questions:  . In the past 14 days have you had a new cough, new headache, new nasal congestion, fever (100.4 or greater) unexplained body aches, new sore throat, or sudden loss of taste or sense of smell? no . In the past 14 days have you been around anyone with known Covid 19? no . Have you been vaccinated for COVID-19? No  Reviewed procedure/mask/visitor instructions, COVID-19 questions with patient.

## 2020-09-21 NOTE — Telephone Encounter (Signed)
CSW received referral to assist patient with some financial concerns. Patient states he works part time and has SSA although struggles with financial issues. Patient has an upcoming procedure that will increase his out of pocket expenses. In addition, he has a [employer] that is due this week. CSW will explore options for financial assistance with [employer] through the Patient Care Fund and retrun cal to patient. Lasandra Beech, LCSW, CCSW-MCS [phone]

## 2020-09-22 ENCOUNTER — Telehealth: Payer: Self-pay | Admitting: Licensed Clinical Social Worker

## 2020-09-22 NOTE — Telephone Encounter (Signed)
CSW contacted patient to inform of assistance through the Patient Care Fund for payment of his utilities. Patient grateful for the assistance and will reach out to CSW if further needs arise. Lasandra Beech, LCSW, CCSW-MCS [phone]

## 2020-09-23 ENCOUNTER — Encounter (HOSPITAL_COMMUNITY)
Admission: RE | Disposition: A | Discharge status: Home / Self Care | Payer: Self-pay | Source: Home / Self Care | Attending: Cardiovascular Disease

## 2020-09-23 ENCOUNTER — Encounter (HOSPITAL_COMMUNITY): Payer: Self-pay | Admitting: Cardiovascular Disease

## 2020-09-23 ENCOUNTER — Ambulatory Visit (HOSPITAL_COMMUNITY)
Admission: RE | Admit: 2020-09-23 | Discharge: 2020-09-23 | Disposition: A | Discharge status: Home / Self Care | Payer: Medicare HMO | Attending: Cardiovascular Disease | Admitting: Cardiovascular Disease

## 2020-09-23 DIAGNOSIS — I2510 Atherosclerotic heart disease of native coronary artery without angina pectoris: Secondary | ICD-10-CM | POA: Diagnosis not present

## 2020-09-23 DIAGNOSIS — I130 Hypertensive heart and chronic kidney disease with heart failure and stage 1 through stage 4 chronic kidney disease, or unspecified chronic kidney disease: Secondary | ICD-10-CM | POA: Diagnosis not present

## 2020-09-23 DIAGNOSIS — I25118 Atherosclerotic heart disease of native coronary artery with other forms of angina pectoris: Secondary | ICD-10-CM | POA: Diagnosis not present

## 2020-09-23 DIAGNOSIS — I5022 Chronic systolic (congestive) heart failure: Secondary | ICD-10-CM | POA: Diagnosis not present

## 2020-09-23 DIAGNOSIS — Z951 Presence of aortocoronary bypass graft: Secondary | ICD-10-CM | POA: Insufficient documentation

## 2020-09-23 DIAGNOSIS — N1831 Chronic kidney disease, stage 3a: Secondary | ICD-10-CM | POA: Insufficient documentation

## 2020-09-23 DIAGNOSIS — E785 Hyperlipidemia, unspecified: Secondary | ICD-10-CM | POA: Diagnosis not present

## 2020-09-23 DIAGNOSIS — Z87891 Personal history of nicotine dependence: Secondary | ICD-10-CM | POA: Insufficient documentation

## 2020-09-23 DIAGNOSIS — I2582 Chronic total occlusion of coronary artery: Secondary | ICD-10-CM | POA: Insufficient documentation

## 2020-09-23 DIAGNOSIS — E1122 Type 2 diabetes mellitus with diabetic chronic kidney disease: Secondary | ICD-10-CM | POA: Insufficient documentation

## 2020-09-23 DIAGNOSIS — Z95810 Presence of automatic (implantable) cardiac defibrillator: Secondary | ICD-10-CM | POA: Insufficient documentation

## 2020-09-23 DIAGNOSIS — Z8249 Family history of ischemic heart disease and other diseases of the circulatory system: Secondary | ICD-10-CM | POA: Diagnosis not present

## 2020-09-23 DIAGNOSIS — I5042 Chronic combined systolic (congestive) and diastolic (congestive) heart failure: Secondary | ICD-10-CM | POA: Diagnosis not present

## 2020-09-23 HISTORY — PX: RIGHT/LEFT HEART CATH AND CORONARY/GRAFT ANGIOGRAPHY: CATH118267

## 2020-09-23 LAB — POCT I-STAT EG7
Acid-Base Excess: 2 mmol/L (ref 0.0–2.0)
Bicarbonate: 27.4 mmol/L (ref 20.0–28.0)
Calcium, Ion: 1.18 mmol/L (ref 1.15–1.40)
HCT: 37 % — ABNORMAL LOW (ref 39.0–52.0)
Hemoglobin: 12.6 g/dL — ABNORMAL LOW (ref 13.0–17.0)
O2 Saturation: 78 %
Potassium: 4.2 mmol/L (ref 3.5–5.1)
Sodium: 138 mmol/L (ref 135–145)
TCO2: 29 mmol/L (ref 22–32)
pCO2, Ven: 43.6 mmHg — ABNORMAL LOW (ref 44.0–60.0)
pH, Ven: 7.406 (ref 7.250–7.430)
pO2, Ven: 42 mmHg (ref 32.0–45.0)

## 2020-09-23 LAB — POCT I-STAT 7, (LYTES, BLD GAS, ICA,H+H)
Acid-Base Excess: 2 mmol/L (ref 0.0–2.0)
Bicarbonate: 26.8 mmol/L (ref 20.0–28.0)
Calcium, Ion: 1.17 mmol/L (ref 1.15–1.40)
HCT: 37 % — ABNORMAL LOW (ref 39.0–52.0)
Hemoglobin: 12.6 g/dL — ABNORMAL LOW (ref 13.0–17.0)
O2 Saturation: 99 %
Potassium: 4.3 mmol/L (ref 3.5–5.1)
Sodium: 138 mmol/L (ref 135–145)
TCO2: 28 mmol/L (ref 22–32)
pCO2 arterial: 41.6 mmHg (ref 32.0–48.0)
pH, Arterial: 7.418 (ref 7.350–7.450)
pO2, Arterial: 148 mmHg — ABNORMAL HIGH (ref 83.0–108.0)

## 2020-09-23 SURGERY — RIGHT/LEFT HEART CATH AND CORONARY/GRAFT ANGIOGRAPHY
Anesthesia: LOCAL

## 2020-09-23 MED ORDER — SODIUM CHLORIDE 0.9% FLUSH: 3.0000 mL | INTRAVENOUS | Status: DC | PRN

## 2020-09-23 MED ORDER — HEPARIN (PORCINE) IN NACL 1000-0.9 UT/500ML-% IV SOLN
INTRAVENOUS | Status: AC
  Filled 2020-09-23: qty 1000

## 2020-09-23 MED ORDER — ASPIRIN 81 MG PO CHEW: 81.0000 mg | CHEWABLE_TABLET | ORAL | Status: DC

## 2020-09-23 MED ORDER — LIDOCAINE HCL (PF) 1 % IJ SOLN
INTRAMUSCULAR | Status: AC
  Filled 2020-09-23: qty 30

## 2020-09-23 MED ORDER — LABETALOL HCL 5 MG/ML IV SOLN: 10.0000 mg | INTRAVENOUS | Status: DC | PRN

## 2020-09-23 MED ORDER — HEPARIN SODIUM (PORCINE) 1000 UNIT/ML IJ SOLN
INTRAMUSCULAR | Status: DC | PRN
  Administered 2020-09-23: 5000 [IU] via INTRAVENOUS

## 2020-09-23 MED ORDER — SODIUM CHLORIDE 0.9 % IV SOLN: 250.0000 mL | INTRAVENOUS | Status: DC | PRN

## 2020-09-23 MED ORDER — ACETAMINOPHEN 325 MG PO TABS: 650.0000 mg | ORAL_TABLET | ORAL | Status: DC | PRN

## 2020-09-23 MED ORDER — HYDRALAZINE HCL 20 MG/ML IJ SOLN: 10.0000 mg | INTRAMUSCULAR | Status: DC | PRN

## 2020-09-23 MED ORDER — FENTANYL CITRATE (PF) 100 MCG/2ML IJ SOLN
INTRAMUSCULAR | Status: AC
  Filled 2020-09-23: qty 2

## 2020-09-23 MED ORDER — LIDOCAINE HCL (PF) 1 % IJ SOLN
INTRAMUSCULAR | Status: DC | PRN
  Administered 2020-09-23 (×2): 2 mL

## 2020-09-23 MED ORDER — FENTANYL CITRATE (PF) 100 MCG/2ML IJ SOLN
INTRAMUSCULAR | Status: DC | PRN
  Administered 2020-09-23: 25 ug via INTRAVENOUS

## 2020-09-23 MED ORDER — MIDAZOLAM HCL 2 MG/2ML IJ SOLN
INTRAMUSCULAR | Status: DC | PRN
  Administered 2020-09-23: 2 mg via INTRAVENOUS

## 2020-09-23 MED ORDER — MIDAZOLAM HCL 2 MG/2ML IJ SOLN
INTRAMUSCULAR | Status: AC
  Filled 2020-09-23: qty 2

## 2020-09-23 MED ORDER — VERAPAMIL HCL 2.5 MG/ML IV SOLN
INTRAVENOUS | Status: DC | PRN
  Administered 2020-09-23: 10 mL via INTRA_ARTERIAL

## 2020-09-23 MED ORDER — SODIUM CHLORIDE 0.9% FLUSH: 3.0000 mL | Freq: Two times a day (BID) | INTRAVENOUS | Status: DC

## 2020-09-23 MED ORDER — IOHEXOL 350 MG/ML SOLN
INTRAVENOUS | Status: AC
  Filled 2020-09-23: qty 1

## 2020-09-23 MED ORDER — HEPARIN (PORCINE) IN NACL 1000-0.9 UT/500ML-% IV SOLN
INTRAVENOUS | Status: DC | PRN
  Administered 2020-09-23 (×2): 500 mL

## 2020-09-23 MED ORDER — HEPARIN SODIUM (PORCINE) 1000 UNIT/ML IJ SOLN
INTRAMUSCULAR | Status: AC
  Filled 2020-09-23: qty 1

## 2020-09-23 MED ORDER — IOHEXOL 350 MG/ML SOLN
INTRAVENOUS | Status: DC | PRN
  Administered 2020-09-23: 60 mL

## 2020-09-23 MED ORDER — SODIUM CHLORIDE 0.9 % IV SOLN: INTRAVENOUS | Status: DC

## 2020-09-23 MED ORDER — VERAPAMIL HCL 2.5 MG/ML IV SOLN
INTRAVENOUS | Status: AC
  Filled 2020-09-23: qty 2

## 2020-09-23 MED ORDER — ONDANSETRON HCL 4 MG/2ML IJ SOLN: 4.0000 mg | Freq: Four times a day (QID) | INTRAMUSCULAR | Status: DC | PRN

## 2020-09-23 SURGICAL SUPPLY — 13 items
CATH BALLN WEDGE 5F 110CM (CATHETERS) ×2 IMPLANT
CATH INFINITI 5FR AL1 (CATHETERS) ×2 IMPLANT
CATH INFINITI 5FR MULTPACK ANG (CATHETERS) ×2 IMPLANT
DEVICE RAD COMP TR BAND LRG (VASCULAR PRODUCTS) ×2 IMPLANT
GLIDESHEATH SLEND SS 6F .021 (SHEATH) ×2 IMPLANT
GUIDEWIRE .025 260CM (WIRE) ×2 IMPLANT
GUIDEWIRE INQWIRE 1.5J.035X260 (WIRE) ×1 IMPLANT
INQWIRE 1.5J .035X260CM (WIRE) ×2
KIT HEART LEFT (KITS) ×2 IMPLANT
PACK CARDIAC CATHETERIZATION (CUSTOM PROCEDURE TRAY) ×2 IMPLANT
SHEATH GLIDE SLENDER 4/5FR (SHEATH) ×2 IMPLANT
TRANSDUCER W/STOPCOCK (MISCELLANEOUS) ×2 IMPLANT
TUBING CIL FLEX 10 FLL-RA (TUBING) ×2 IMPLANT

## 2020-09-23 NOTE — Discharge Instructions (Signed)
Radial Site Care  This sheet gives you information about how to care for yourself after your procedure. Your health care provider may also give you more specific instructions. If you have problems or questions, contact your health care provider. What can I expect after the procedure? After the procedure, it is common to have:  Bruising and tenderness at the catheter insertion area. Follow these instructions at home: Medicines  Take over-the-counter and prescription medicines only as told by your health care provider. Insertion site care  Follow instructions from your health care provider about how to take care of your insertion site. Make sure you: ? Wash your hands with soap and water before you change your bandage (dressing). If soap and water are not available, use hand sanitizer. ? Change your dressing as told by your health care provider. ? Leave stitches (sutures), skin glue, or adhesive strips in place. These skin closures may need to stay in place for 2 weeks or longer. If adhesive strip edges start to loosen and curl up, you may trim the loose edges. Do not remove adhesive strips completely unless your health care provider tells you to do that.  Check your insertion site every day for signs of infection. Check for: ? Redness, swelling, or pain. ? Fluid or blood. ? Pus or a bad smell. ? Warmth.  Do not take baths, swim, or use a hot tub until your health care provider approves.  You may shower 24-48 hours after the procedure, or as directed by your health care provider. ? Remove the dressing and gently wash the site with plain soap and water. ? Pat the area dry with a clean towel. ? Do not rub the site. That could cause bleeding.  Do not apply powder or lotion to the site. Activity   For 24 hours after the procedure, or as directed by your health care provider: ? Do not flex or bend the affected arm. ? Do not push or pull heavy objects with the affected arm. ? Do not  drive yourself home from the hospital or clinic. You may drive 24 hours after the procedure unless your health care provider tells you not to. ? Do not operate machinery or power tools.  Do not lift anything that is heavier than 10 lb (4.5 kg), or the limit that you are told, until your health care provider says that it is safe.  Ask your health care provider when it is okay to: ? Return to work or school. ? Resume usual physical activities or sports. ? Resume sexual activity. General instructions  If the catheter site starts to bleed, raise your arm and put firm pressure on the site. If the bleeding does not stop, get help right away. This is a medical emergency.  If you went home on the same day as your procedure, a responsible adult should be with you for the first 24 hours after you arrive home.  Keep all follow-up visits as told by your health care provider. This is important. Contact a health care provider if:  You have a fever.  You have redness, swelling, or yellow drainage around your insertion site. Get help right away if:  You have unusual pain at the radial site.  The catheter insertion area swells very fast.  The insertion area is bleeding, and the bleeding does not stop when you hold steady pressure on the area.  Your arm or hand becomes pale, cool, tingly, or numb. These symptoms may represent a serious problem   that is an emergency. Do not wait to see if the symptoms will go away. Get medical help right away. Call your local emergency services (911 in the [place] Do not drive yourself to the hospital. Summary  After the procedure, it is common to have bruising and tenderness at the site.  Follow instructions from your health care provider about how to take care of your radial site wound. Check the wound every day for signs of infection.  Do not lift anything that is heavier than 10 lb (4.5 kg), or the limit that you are told, until your health care provider says  that it is safe. This information is not intended to replace advice given to you by your health care provider. Make sure you discuss any questions you have with your health care provider. Document Revised: 12/19/2017 Document Reviewed: 12/19/2017 Elsevier Patient Education  2020 Elsevier Inc.  

## 2020-09-23 NOTE — Interval H&P Note (Signed)
History and Physical Interval Note:  09/23/2020 8:39 AM  Joseph [name]  has presented today for surgery, with the diagnosis of cardiomyopathy.  The various methods of treatment have been discussed with the patient and family. After consideration of risks, benefits and other options for treatment, the patient has consented to  Procedure(s): RIGHT/LEFT HEART CATH AND CORONARY/GRAFT ANGIOGRAPHY (N/A) as a surgical intervention.  The patient's history has been reviewed, patient examined, no change in status, stable for surgery.  I have reviewed the patient's chart and labs.  Questions were answered to the patient's satisfaction.     Tonny Bollman

## 2020-09-30 ENCOUNTER — Ambulatory Visit (INDEPENDENT_AMBULATORY_CARE_PROVIDER_SITE_OTHER): Payer: Medicare HMO | Admitting: Internal Medicine

## 2020-09-30 ENCOUNTER — Encounter: Payer: Self-pay | Admitting: Internal Medicine

## 2020-09-30 ENCOUNTER — Other Ambulatory Visit: Payer: Self-pay

## 2020-09-30 VITALS — BP 132/77 | HR 65 | Ht 71.0 in | Wt 189.2 lb

## 2020-09-30 DIAGNOSIS — I5042 Chronic combined systolic (congestive) and diastolic (congestive) heart failure: Secondary | ICD-10-CM

## 2020-09-30 NOTE — Patient Instructions (Signed)
Medication Instructions:  Your physician recommends that you continue on your current medications as directed. Please refer to the Current Medication list given to you today.  *If you need a refill on your cardiac medications before your next appointment, please call your pharmacy*   Lab Work: NONE   If you have labs (blood work) drawn today and your tests are completely normal, you will receive your results only by: . MyChart Message (if you have MyChart) OR . A paper copy in the mail If you have any lab test that is abnormal or we need to change your treatment, we will call you to review the results.   Testing/Procedures: NONE    Follow-Up: At [facility], you and your health needs are our priority.  As part of our continuing mission to provide you with exceptional heart care, we have created designated Provider Care Teams.  These Care Teams include your primary Cardiologist (physician) and Advanced Practice Providers (APPs -  Physician Assistants and Nurse Practitioners) who all work together to provide you with the care you need, when you need it.  We recommend signing up for the patient portal called "MyChart".  Sign up information is provided on this After Visit Summary.  MyChart is used to connect with patients for Virtual Visits (Telemedicine).  Patients are able to view lab/test results, encounter notes, upcoming appointments, etc.  Non-urgent messages can be sent to your provider as well.   To learn more about what you can do with MyChart, go to [email or web address].    Your next appointment:   1 year(s)  The format for your next appointment:   In Person  Provider:   Gregg Taylor, MD   Other Instructions Thank you for choosing [facility]!    

## 2020-09-30 NOTE — Progress Notes (Signed)
HPI Mr. Savant returns today for followup. He has an ICM, s/p MI, s/p ICD insertion. He recently underwent left and right heart cath with occluded native arteries and patent vein grafts/LIMA. He was noted to have distal LAD disease, downstream from his LIMA insertion. He has done well in the interim. He has not been vaccinated. He denies chest pain. He denies sob except with significant exertion. No edema.  Allergies  Allergen Reactions   Entresto [Sacubitril-Valsartan]     Worsening renal function and weight gain     Current Outpatient Medications  Medication Sig Dispense Refill   acetaminophen (TYLENOL) 500 MG tablet Take 500 mg by mouth every 6 (six) hours as needed for pain.     albuterol (VENTOLIN HFA) 108 (90 Base) MCG/ACT inhaler Inhale 2 puffs into the lungs every 4 (four) hours as needed for wheezing or shortness of breath. 6.7 g 0   aspirin EC 81 MG tablet Take 81 mg by mouth every evening. Swallow whole.     atorvastatin (LIPITOR) 80 MG tablet TAKE 1 TABLET BY MOUTH EVERY DAY IN THE EVENING 90 tablet 0   enalapril (VASOTEC) 20 MG tablet TAKE 1 TABLET BY MOUTH TWICE A DAY 180 tablet 3   fish oil-omega-3 fatty acids 1000 MG capsule Take 1 g by mouth 3 (three) times daily.      hydrOXYzine (VISTARIL) 25 MG capsule Take 1 capsule (25 mg total) by mouth 3 (three) times daily as needed. 30 capsule 0   KLOR-CON M20 20 MEQ tablet TAKE 1 TABLET BY MOUTH EVERY DAY 90 tablet 3   metoprolol succinate (TOPROL-XL) 100 MG 24 hr tablet Take 1 tablet (100 mg total) by mouth daily. Take with or immediately following a meal. 90 tablet 3   Multiple Vitamin (MULTIVITAMIN WITH MINERALS) TABS tablet Take 1 tablet by mouth daily.     nitroGLYCERIN (NITROSTAT) 0.4 MG SL tablet Place 1 tablet (0.4 mg total) under the tongue every 5 (five) minutes as needed for chest pain. 25 tablet 3   omeprazole (PRILOSEC) 20 MG capsule TAKE 1 CAPSULE BY MOUTH EVERY DAY 90 capsule 3   torsemide  (DEMADEX) 20 MG tablet TAKE 1 TABLET (20 MG TOTAL) 2 (TWO) TIMES DAILY. DOUBLE UP FOR 1.5 WEEKS ON TOP OF CURRENT DOSE 20 tablet 0   No current facility-administered medications for this visit.     Past Medical History:  Diagnosis Date   CHF (congestive heart failure) (HCC)    a. EF 25-30% in 2016 b. 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 Jude   Small bowel obstruction (HCC) 2007   resolved without surgery    ROS:   All systems reviewed and negative except as noted in the HPI.   Past Surgical History:  Procedure Laterality Date   basilic vein left arm resection     CARDIAC DEFIBRILLATOR PLACEMENT     CORONARY ARTERY BYPASS GRAFT     6 vessels, 2000   INSERT / REPLACE / REMOVE PACEMAKER  01/05/10   ICD insertion/St. Jude single chamber   RIGHT/LEFT HEART CATH  AND CORONARY/GRAFT ANGIOGRAPHY N/A 09/23/2020   Procedure: RIGHT/LEFT HEART CATH AND CORONARY/GRAFT ANGIOGRAPHY;  Surgeon: Tonny Bollman, MD;  Location: [facility] INVASIVE CV LAB;  Service: Cardiovascular;  Laterality: N/A;     Family History  Problem Relation Age of Onset   Heart attack Mother    Emphysema Father    Coronary artery disease Father        s/p cabg   Colon cancer Neg Hx      Social History   Socioeconomic History   Marital status: Married    Spouse name: Kathie Rhodes   Number of children: 3   Years of education: GED   Highest education level: Not on file  Occupational History   Occupation: Runner, 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  Tobacco Use   Smoking status: Former Smoker    Types: Cigarettes    Start date: 11/28/1967    Quit  date: 02/29/1988    Years since quitting: 32.6   Smokeless tobacco: Never Used  Vaping Use   Vaping Use: Never used  Substance and Sexual Activity   Alcohol use: No    Alcohol/week: 0.0 standard drinks   Drug use: No   Sexual activity: Never  Other Topics Concern   Not on file  Social History Narrative   1 son died age 59-Lee's syndrome         Social Determinants of Health   Financial Resource Strain: Low Risk    Difficulty of Paying Living Expenses: Not very hard  Food Insecurity: No Food Insecurity   Worried About Programme researcher, broadcasting/film/video in the Last Year: Never true   Ran Out of Food in the Last Year: Never true  Transportation Needs: No Transportation Needs   Lack of Transportation (Medical): No   Lack of Transportation (Non-Medical): No  Physical Activity: Insufficiently Active   Days of Exercise per Week: 3 days   Minutes of Exercise per Session: 30 min  Stress: No Stress Concern Present   Feeling of Stress : Only a little  Social Connections: Press photographer of Communication with Friends and Family: More than three times a week   Frequency of Social Gatherings with Friends and Family: More than three times a week   Attends Religious Services: More than 4 times per year   Active Member of [employer] or Organizations: Yes   Attends Engineer, structural: More than 4 times per year   Marital Status: Married  Catering manager Violence: Not At Risk   Fear of Current or Ex-Partner: No   Emotionally Abused: No   Physically Abused: No   Sexually Abused: No     BP 132/77    Pulse 65    Ht 5\' 11"  (1.803 m)    Wt 189 lb 3.2 oz (85.8 kg)    SpO2 98%    BMI 26.39 kg/m   Physical Exam:  Well appearing NAD HEENT: Unremarkable Neck:  No JVD, no thyromegally Lymphatics:  No adenopathy Back:  No CVA tenderness Lungs:  Clear with no wheezes HEART:  Regular rate rhythm, no murmurs, no rubs, no clicks Abd:  soft, positive bowel sounds,  no organomegally, no rebound, no guarding Ext:  2 plus pulses, no edema, no cyanosis, no clubbing Skin:  No rashes no nodules Neuro:  CN II through XII intact, motor grossly intact   DEVICE  Normal device function.  See PaceArt for details. 1.6 years to ERI  Assess/Plan: 1. Chronic systolic heart failure - his symptoms  are class 2. He will continue his current guideline directed medical therapy. 2. ICD - his St. Jude single chamber ICD is working normally. 3. CAD - he has undergone left heart cath, results as above. 4. HTN - his bp is well controlled. No change in meds.  Sharlot Gowda Kelda Azad,MD

## 2020-10-03 ENCOUNTER — Other Ambulatory Visit: Payer: Self-pay | Admitting: Family Medicine

## 2020-10-04 ENCOUNTER — Ambulatory Visit (INDEPENDENT_AMBULATORY_CARE_PROVIDER_SITE_OTHER): Payer: Medicare HMO

## 2020-10-04 DIAGNOSIS — I255 Ischemic cardiomyopathy: Secondary | ICD-10-CM

## 2020-10-04 LAB — CUP PACEART REMOTE DEVICE CHECK
Battery Remaining Longevity: 18 mo
Battery Remaining Percentage: 15 %
Battery Voltage: 2.74 V
Brady Statistic RV Percent Paced: 1 %
Date Time Interrogation Session: 20211108033320
HighPow Impedance: 51 Ohm
Implantable Lead Implant Date: 20110209
Implantable Lead Location: 753860
Implantable Lead Model: 7120
Implantable Pulse Generator Implant Date: 20110209
Lead Channel Impedance Value: 440 Ohm
Lead Channel Pacing Threshold Amplitude: 0.75 V
Lead Channel Pacing Threshold Pulse Width: 0.4 ms
Lead Channel Sensing Intrinsic Amplitude: 12 mV
Lead Channel Setting Pacing Amplitude: 2.5 V
Lead Channel Setting Pacing Pulse Width: 0.4 ms
Lead Channel Setting Sensing Sensitivity: 0.5 mV
Pulse Gen Serial Number: 747960

## 2020-10-05 NOTE — Progress Notes (Signed)
Remote ICD transmission.   

## 2020-10-06 ENCOUNTER — Telehealth: Payer: Self-pay | Admitting: Licensed Clinical Social Worker

## 2020-10-06 NOTE — Telephone Encounter (Signed)
CSW received call from patient per CSW request to follow up with additional support for his utility bill. CSW referred patient for assistance through the Patient Care Fund to cover this month. CSW also sent patient an application in the mail for the [employer]IE[employer] program for patient to complete and drop off to Social services after December 1. Patient grateful for the support and assistance. Lasandra Beech, LCSW, CCSW-MCS [phone]

## 2020-10-14 ENCOUNTER — Other Ambulatory Visit: Payer: Medicare HMO

## 2020-10-14 ENCOUNTER — Other Ambulatory Visit: Payer: Self-pay

## 2020-10-14 DIAGNOSIS — I5022 Chronic systolic (congestive) heart failure: Secondary | ICD-10-CM | POA: Diagnosis not present

## 2020-10-15 LAB — CMP14+EGFR
ALT: 26 IU/L (ref 0–44)
AST: 25 IU/L (ref 0–40)
Albumin/Globulin Ratio: 1.8 (ref 1.2–2.2)
Albumin: 4.4 g/dL (ref 3.8–4.8)
Alkaline Phosphatase: 144 IU/L — ABNORMAL HIGH (ref 44–121)
BUN/Creatinine Ratio: 12 (ref 10–24)
BUN: 19 mg/dL (ref 8–27)
Bilirubin Total: 0.6 mg/dL (ref 0.0–1.2)
CO2: 23 mmol/L (ref 20–29)
Calcium: 9 mg/dL (ref 8.6–10.2)
Chloride: 97 mmol/L (ref 96–106)
Creatinine, Ser: 1.62 mg/dL — ABNORMAL HIGH (ref 0.76–1.27)
GFR calc Af Amer: 50 mL/min/{1.73_m2} — ABNORMAL LOW (ref >59)
GFR calc non Af Amer: 44 mL/min/{1.73_m2} — ABNORMAL LOW (ref >59)
Globulin, Total: 2.4 g/dL (ref 1.5–4.5)
Glucose: 112 mg/dL — ABNORMAL HIGH (ref 65–99)
Potassium: 4.7 mmol/L (ref 3.5–5.2)
Sodium: 140 mmol/L (ref 134–144)
Total Protein: 6.8 g/dL (ref 6.0–8.5)

## 2020-10-20 ENCOUNTER — Encounter: Payer: Self-pay | Admitting: Student

## 2020-10-20 ENCOUNTER — Other Ambulatory Visit: Payer: Self-pay

## 2020-10-20 ENCOUNTER — Ambulatory Visit (INDEPENDENT_AMBULATORY_CARE_PROVIDER_SITE_OTHER): Payer: Medicare HMO | Admitting: Student

## 2020-10-20 VITALS — BP 122/68 | HR 78 | Ht 71.0 in | Wt 193.0 lb

## 2020-10-20 DIAGNOSIS — E785 Hyperlipidemia, unspecified: Secondary | ICD-10-CM | POA: Diagnosis not present

## 2020-10-20 DIAGNOSIS — I5042 Chronic combined systolic (congestive) and diastolic (congestive) heart failure: Secondary | ICD-10-CM | POA: Diagnosis not present

## 2020-10-20 DIAGNOSIS — N1832 Chronic kidney disease, stage 3b: Secondary | ICD-10-CM

## 2020-10-20 DIAGNOSIS — Z95810 Presence of automatic (implantable) cardiac defibrillator: Secondary | ICD-10-CM

## 2020-10-20 DIAGNOSIS — I2510 Atherosclerotic heart disease of native coronary artery without angina pectoris: Secondary | ICD-10-CM

## 2020-10-20 DIAGNOSIS — I10 Essential (primary) hypertension: Secondary | ICD-10-CM

## 2020-10-20 MED ORDER — TORSEMIDE 20 MG PO TABS: 30.0000 mg | ORAL_TABLET | Freq: Every day | ORAL | 3 refills | Status: DC

## 2020-10-20 NOTE — Patient Instructions (Addendum)
Medication Instructions:  Your physician recommends that you continue on your current medications as directed. Please refer to the Current Medication list given to you today.  Decrease Torsemide to 30 mg Daily   *If you need a refill on your cardiac medications before your next appointment, please call your pharmacy*   Lab Work: NONE   If you have labs (blood work) drawn today and your tests are completely normal, you will receive your results only by:  MyChart Message (if you have MyChart) OR  A paper copy in the mail If you have any lab test that is abnormal or we need to change your treatment, we will call you to review the results.   Testing/Procedures: NONE   Follow-Up: At [facility], you and your health needs are our priority.  As part of our continuing mission to provide you with exceptional heart care, we have created designated Provider Care Teams.  These Care Teams include your primary Cardiologist (physician) and Advanced Practice Providers (APPs -  Physician Assistants and Nurse Practitioners) who all work together to provide you with the care you need, when you need it.  We recommend signing up for the patient portal called "MyChart".  Sign up information is provided on this After Visit Summary.  MyChart is used to connect with patients for Virtual Visits (Telemedicine).  Patients are able to view lab/test results, encounter notes, upcoming appointments, etc.  Non-urgent messages can be sent to your provider as well.   To learn more about what you can do with MyChart, go to [email or web address].    Your next appointment:   3 month(s)  The format for your next appointment:   In Person  Provider:   Randall An, PA-C   Other Instructions Thank you for choosing [facility]!

## 2020-10-20 NOTE — Progress Notes (Signed)
Cardiology Office Note    Date:  10/21/2020   ID:  Joseph, [name] [birth year], MRN [id number]  PCP:  [name], Elige Radon, MD  Cardiologist: Rollene Rotunda, MD  --> Patient wishes to follow-up in [place] with Dr. Diona Browner EP: Dr. Ladona Ridgel  Chief Complaint  Patient presents with  . Follow-up    s/p cardiac catheterization    History of Present Illness:    Joseph [name] is a 64 y.o. male with past medical history of CAD (s/p CABG in 2000, NST in 05/2008 showing scar with no ischemia, patent grafts in 2011), chronic combined systolic and diastolic CHF (EF25-30% in 2016, at35-40% by echo in 10/2017, 30-35% in 01/2019, and 35-40% by echo in 08/2020, s/p St. Jude ICD placement in 2011), HTN, HLD, Type 2 DM (diet-controlled) and Stage 3 CKD who presents to the office today for follow-up from his recent cardiac catheterization.  He was last examined by myself in 08/2020 following multiple emergency department visits for acute CHF exacerbations. His Lopressor was transitioned to Toprol-XL given his cardiomyopathy and repeat ischemic evaluation was recommended given the timeframe since his last study. He called back to the office and was in agreement to proceed with a cardiac catheterization. This was performed by Dr. Excell Seltzer on 09/23/2020 and showed severe native three-vessel CAD with patent LIMA to LAD, seq SVG-OM1-OM2, SVG-D1 and SVG-PDA.  He did have progression of distal LAD stenosis beyond the LIMA insertion site but continued medical therapy was recommended. Right heart hemodynamics were normal with normal filling pressures and preserved cardiac output.  In talking with the patient today, he reports overall doing well since his last visit. Breathing has been at baseline and he denies any recent orthopnea or PND. No recurrent visits to the ED. He does have chronic lower extremity edema but says this has improved. No recent chest pain or palpitations.   He has remained on the increased dose  of Torsemide  daily and recent labs by his PCP last week showed his creatinine has increased to 1.62 (previously 1.1 - 1.3 in 08/2020).   Past Medical History:  Diagnosis Date  . CHF (congestive heart failure) (HCC)    a. EF 25-30% in 2016 b. 35-40% by echo in 10/2017 c. 35-40% in 08/2020  . COPD (chronic obstructive pulmonary disease) (HCC)   . Coronary artery disease    a. s/p CABG in 2000 b. patent grafts in 2011 c. 08/2020: repeat cath showing severe native CAD with patent bypass grafts --> medical therapy recommended  . GI bleed    Mallory-Weiss tear EGD 02/2010  . Hyperlipidemia   . Hypertension   . Ileus (HCC) 2006   required hospitalization  . Left ventricular dysfunction    apical thrombus  . MRSA bacteremia   . 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 Jude  . Small bowel obstruction (HCC) 2007   resolved without surgery    Past Surgical History:  Procedure Laterality Date  . basilic vein left arm resection    . CARDIAC DEFIBRILLATOR PLACEMENT    . CORONARY ARTERY BYPASS GRAFT     6 vessels, 2000  . INSERT / REPLACE / REMOVE PACEMAKER  01/05/10   ICD insertion/St. Jude single chamber  . RIGHT/LEFT HEART CATH AND CORONARY/GRAFT ANGIOGRAPHY N/A 09/23/2020   Procedure: RIGHT/LEFT HEART CATH AND CORONARY/GRAFT ANGIOGRAPHY;  Surgeon: Tonny Bollman, MD;  Location: [facility] INVASIVE CV LAB;  Service: Cardiovascular;  Laterality: N/A;    Current Medications: Outpatient  Medications Prior to Visit  Medication Sig Dispense Refill  . acetaminophen (TYLENOL) 500 MG tablet Take 500 mg by mouth every 6 (six) hours as needed for pain.    Marland Kitchen albuterol (VENTOLIN HFA) 108 (90 Base) MCG/ACT inhaler Inhale 2 puffs into the lungs every 4 (four) hours as needed for wheezing or shortness of breath. 6.7 g 0  . aspirin EC 81 MG tablet Take 81 mg by mouth every evening. Swallow whole.    Marland Kitchen atorvastatin (LIPITOR) 80 MG tablet TAKE 1 TABLET BY MOUTH  EVERY DAY IN THE EVENING 90 tablet 0  . enalapril (VASOTEC) 20 MG tablet TAKE 1 TABLET BY MOUTH TWICE A DAY 180 tablet 3  . fish oil-omega-3 fatty acids 1000 MG capsule Take 1 g by mouth 3 (three) times daily.     . hydrOXYzine (VISTARIL) 25 MG capsule Take 1 capsule (25 mg total) by mouth 3 (three) times daily as needed. 30 capsule 0  . KLOR-CON M20 20 MEQ tablet TAKE 1 TABLET BY MOUTH EVERY DAY 90 tablet 3  . metoprolol succinate (TOPROL-XL) 100 MG 24 hr tablet Take 1 tablet (100 mg total) by mouth daily. Take with or immediately following a meal. 90 tablet 3  . Multiple Vitamin (MULTIVITAMIN WITH MINERALS) TABS tablet Take 1 tablet by mouth daily.    . nitroGLYCERIN (NITROSTAT) 0.4 MG SL tablet Place 1 tablet (0.4 mg total) under the tongue every 5 (five) minutes as needed for chest pain. 25 tablet 3  . omeprazole (PRILOSEC) 20 MG capsule TAKE 1 CAPSULE BY MOUTH EVERY DAY 90 capsule 3  . torsemide (DEMADEX) 20 MG tablet TAKE 2 TABLETS BY MOUTH EVERY DAY 180 tablet 0   No facility-administered medications prior to visit.     Allergies:   Entresto [sacubitril-valsartan]   Social History   Socioeconomic History  . Marital status: Married    Spouse name: Kathie Rhodes  . Number of children: 3  . Years of education: GED  . Highest education level: Not on file  Occupational History  . 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 Smoking status: Former Smoker    Types: Cigarettes    Start date: 11/28/1967    Quit date: 02/29/1988    Years since quitting: 32.6  . Smokeless tobacco: Never Used  Vaping Use  . Vaping Use: Never used  Substance and Sexual Activity  . Alcohol use: No    Alcohol/week: 0.0 standard drinks  . Drug use: No  . Sexual activity: Never  Other Topics Concern  . Not on file  Social History Narrative   1 son died age 48-Lee's syndrome         Social Determinants of Health   Financial Resource Strain: Low Risk   . Difficulty of Paying Living  Expenses: Not very hard  Food Insecurity: No Food Insecurity  . Worried About Programme researcher, broadcasting/film/video in the Last Year: Never true  . Ran Out of Food in the Last Year: Never true  Transportation Needs: No Transportation Needs  . Lack of Transportation (Medical): No  . Lack of Transportation (Non-Medical): No  Physical Activity: Insufficiently Active  . Days of Exercise per Week: 3 days  . Minutes of Exercise per Session: 30 min  Stress: No Stress Concern Present  . Feeling of Stress : Only a little  Social Connections: Socially Integrated  . Frequency of Communication with Friends and Family: More than three times a week  . Frequency  of Social Gatherings with Friends and Family: More than three times a week  . Attends Religious Services: More than 4 times per year  . Active Member of Clubs or Organizations: Yes  . Attends Banker Meetings: More than 4 times per year  . Marital Status: Married     Family History:  The patient's family history includes Coronary artery disease in his father; Emphysema in his father; Heart attack in his mother.   Review of Systems:   Please see the history of present illness.     General:  No chills, fever, night sweats or weight changes.  Cardiovascular:  No chest pain, dyspnea on exertion, orthopnea, palpitations, paroxysmal nocturnal dyspnea. Positive for edema.  Dermatological: No rash, lesions/masses Respiratory: No cough, dyspnea Urologic: No hematuria, dysuria Abdominal:   No nausea, vomiting, diarrhea, bright red blood per rectum, melena, or hematemesis Neurologic:  No visual changes, wkns, changes in mental status. All other systems reviewed and are otherwise negative except as noted above.   Physical Exam:    VS:  BP 122/68   Pulse 78   Ht 5\' 11"  (1.803 m)   Wt 193 lb (87.5 kg)   SpO2 98%   BMI 26.92 kg/m    General: Well developed, well nourished,male appearing in no acute distress. Head: Normocephalic, atraumatic. Neck:  No carotid bruits. JVD not elevated.  Lungs: Respirations regular and unlabored, without wheezes or rales.  Heart: Regular rate and rhythm. No S3 or S4.  No murmur, no rubs, or gallops appreciated. Abdomen: Appears non-distended. No obvious abdominal masses. Msk:  Strength and tone appear normal for age. No obvious joint deformities or effusions. Extremities: No clubbing or cyanosis. 1+ pitting edema up to mid-shins.  Distal pedal pulses are 2+ bilaterally. Neuro: Alert and oriented X 3. Moves all extremities spontaneously. No focal deficits noted. Psych:  Responds to questions appropriately with a normal affect. Skin: No rashes or lesions noted  Wt Readings from Last 3 Encounters:  10/20/20 193 lb (87.5 kg)  09/30/20 189 lb 3.2 oz (85.8 kg)  09/23/20 182 lb (82.6 kg)     Studies/Labs Reviewed:   EKG:  EKG is not ordered today.   Recent Labs: 09/08/2020: B Natriuretic Peptide 172.0 09/17/2020: Platelets 271 09/23/2020: Hemoglobin 12.6 10/14/2020: ALT 26; BUN 19; Creatinine, Ser 1.62; Potassium 4.7; Sodium 140   Lipid Panel    Component Value Date/Time   CHOL 141 08/19/2020 0820   TRIG 103 08/19/2020 0820   HDL 42 08/19/2020 0820   CHOLHDL 3.4 08/19/2020 0820   CHOLHDL 3.2 03/08/2010 0411   VLDL 10 03/08/2010 0411   LDLCALC 80 08/19/2020 0820    Additional studies/ records that were reviewed today include:   Echocardiogram: 01/2019 IMPRESSIONS    1. The left ventricle has moderate-severely reduced systolic function,  with an ejection fraction of 30-35%. The cavity size was mildly dilated.  There is mildly increased left ventricular wall thickness. Diastolic  function abnormal, indeterminant grade.  2. Diffuse hypokinesis, the anteroseptal wall is akinetic.  3. Left atrial size was severely dilated.  4. Right atrial size was moderately dilated.  5. The mitral valve is normal in structure. No evidence of mitral valve  stenosis.  6. The tricuspid valve is normal  in structure.  7. The aortic valve is tricuspid no stenosis of the aortic valve.  8. The aortic root is normal in size and structure.  9. Pulmonary hypertension is is normal, PASP is 28 mmHg.  10. The interatrial  septum appears to be lipomatous.  11. The interatrial septum was not assessed.   Cardiac Catheterization: 08/2020 1.  Severe native three-vessel coronary artery disease with total occlusion of the mid LAD, mid circumflex, and distal RCA 2.  Status post aortocoronary bypass surgery with continued patency of the LIMA to LAD, sequential saphenous vein graft to OM1 and OM 2, saphenous vein graft to diagonal 1, and saphenous vein graft to PDA 3.  Diffuse mid and distal LAD stenosis beyond the LIMA insertion site, progressive from the previous catheterization 10 years ago 4.  Essentially normal right heart hemodynamics with normal filling pressures and preserved cardiac output  Recommendations: Medical therapy for heart failure and coronary artery disease.  I do not appreciate any targets for PCI.  The patient's bypass grafts all remain patent   Assessment:    1. Coronary artery disease involving native coronary artery of native heart without angina pectoris   2. Chronic combined systolic and diastolic heart failure (HCC)   3. ICD (implantable cardioverter-defibrillator) in place   4. Essential hypertension   5. Hyperlipidemia LDL goal <70   6. Stage 3b chronic kidney disease (HCC)      Plan:   In order of problems listed above:  1. CAD - He is s/p CABG in 2000 and recent cardiac catheterization showed severe native three-vessel CAD with patent LIMA to LAD, seq SVG-OM1-OM2, SVG-D1 and SVG-PDA. He did have progression of distal LAD stenosis beyond the LIMA insertion site but continued medical therapy was recommended. - He denies any recent anginal symptoms. Continue current medication regimen with ASA, Atorvastatin, Toprol-XL and SL NTG.   2. Chronic Combined Systolic and  Diastolic CHF - His EFwas previously 25-30% in 2016, at 35-40% by most recent echo in 08/2020. He had normal filling pressures and preserved cardiac output by recent RHC. His breathing has returned to baseline. He does have lower extremity edema on examination but eats at local restaurants regularly and does not elevate his lower extremities frequently. Recommended limiting his sodium intake.  - His creatinine had increased from 1.3 to 1.6 by recent labs. Will reduce Torsemide slightly from 40mg  daily to 30mg  daily with plans for repeat labs in 2-3 weeks.  - Continue Toprol-XL 100mg  daily and Enalapril 20mg  BID. Previously intolerant to . If renal function does stabilize by follow-up labs, would consider adding Spironolactone.  3. ICD Placement - He is s/p St. Jude ICD placement in 2011 which is followed by Dr. . Most recent interrogation earlier this month showed normal device function.   4. HTN - BP is well-controlled at 122/68 during today's visit. Continue current medication regimen with Enalapril 20mg  BID and Toprol-XL 100mg  daily.   5. HLD - Followed by PCP. LDL was at 80 in 07/2020. He remains on Atorvastatin 80mg  daily and Fish Oil.   6. Stage 3 CKD - Creatinine had increased to 1.62 by most recent labs and was previously 1.1 - 1.3 in 08/2020. Recommended reducing Torsemide as outlined above. He is scheduled for repeat labs in several weeks and also has upcoming follow-up with Dr. 2012.     Medication Adjustments/Labs and Tests Ordered: Current medicines are reviewed at length with the patient today.  Concerns regarding medicines are outlined above.  Medication changes, Labs and Tests ordered today are listed in the Patient Instructions below. Patient Instructions  Medication Instructions:  Your physician recommends that you continue on your current medications as directed. Please refer to the Current Medication list given to you today.  Decrease Torsemide to 30 mg  Daily   *If you need a refill on your cardiac medications before your next appointment, please call your pharmacy*   Lab Work: NONE   If you have labs (blood work) drawn today and your tests are completely normal, you will receive your results only by: Marland Kitchen MyChart Message (if you have MyChart) OR . A paper copy in the mail If you have any lab test that is abnormal or we need to change your treatment, we will call you to review the results.   Testing/Procedures: NONE   Follow-Up: At [facility], you and your health needs are our priority.  As part of our continuing mission to provide you with exceptional heart care, we have created designated Provider Care Teams.  These Care Teams include your primary Cardiologist (physician) and Advanced Practice Providers (APPs -  Physician Assistants and Nurse Practitioners) who all work together to provide you with the care you need, when you need it.  We recommend signing up for the patient portal called "MyChart".  Sign up information is provided on this After Visit Summary.  MyChart is used to connect with patients for Virtual Visits (Telemedicine).  Patients are able to view lab/test results, encounter notes, upcoming appointments, etc.  Non-urgent messages can be sent to your provider as well.   To learn more about what you can do with MyChart, go to [email or web address].    Your next appointment:   3 month(s)  The format for your next appointment:   In Person  Provider:   Randall An, PA-C   Other Instructions Thank you for choosing [facility]!      Signed, Ellsworth Lennox, PA-C  10/21/2020 8:48 AM    [facility] [address] [place], [place] [address] Phone: [phone] Fax: [phone]

## 2020-10-21 ENCOUNTER — Encounter: Payer: Self-pay | Admitting: Student

## 2020-10-27 ENCOUNTER — Encounter: Payer: Self-pay | Admitting: *Deleted

## 2020-10-28 ENCOUNTER — Telehealth: Payer: Self-pay | Admitting: Student

## 2020-10-28 NOTE — Telephone Encounter (Signed)
Called pt. No answer. Left msg to call back.  

## 2020-10-28 NOTE — Telephone Encounter (Signed)
    Please let the patient know I reviewed with Dr. 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   Thanks,  Joseph [name]

## 2020-10-29 NOTE — Telephone Encounter (Signed)
Pt notified and voiced understanding 

## 2020-11-25 ENCOUNTER — Other Ambulatory Visit: Payer: Medicare HMO

## 2020-11-25 ENCOUNTER — Other Ambulatory Visit: Payer: Self-pay

## 2020-11-25 DIAGNOSIS — I129 Hypertensive chronic kidney disease with stage 1 through stage 4 chronic kidney disease, or unspecified chronic kidney disease: Secondary | ICD-10-CM | POA: Diagnosis not present

## 2020-11-25 DIAGNOSIS — E211 Secondary hyperparathyroidism, not elsewhere classified: Secondary | ICD-10-CM | POA: Diagnosis not present

## 2020-11-25 DIAGNOSIS — I5042 Chronic combined systolic (congestive) and diastolic (congestive) heart failure: Secondary | ICD-10-CM | POA: Diagnosis not present

## 2020-11-25 DIAGNOSIS — N1832 Chronic kidney disease, stage 3b: Secondary | ICD-10-CM | POA: Diagnosis not present

## 2020-11-26 DIAGNOSIS — Z7982 Long term (current) use of aspirin: Secondary | ICD-10-CM | POA: Diagnosis not present

## 2020-11-26 DIAGNOSIS — Z95810 Presence of automatic (implantable) cardiac defibrillator: Secondary | ICD-10-CM | POA: Diagnosis not present

## 2020-11-26 DIAGNOSIS — I130 Hypertensive heart and chronic kidney disease with heart failure and stage 1 through stage 4 chronic kidney disease, or unspecified chronic kidney disease: Secondary | ICD-10-CM | POA: Diagnosis not present

## 2020-11-26 DIAGNOSIS — Z743 Need for continuous supervision: Secondary | ICD-10-CM | POA: Diagnosis not present

## 2020-11-26 DIAGNOSIS — R0689 Other abnormalities of breathing: Secondary | ICD-10-CM | POA: Diagnosis not present

## 2020-11-26 DIAGNOSIS — I255 Ischemic cardiomyopathy: Secondary | ICD-10-CM | POA: Diagnosis not present

## 2020-11-26 DIAGNOSIS — E1122 Type 2 diabetes mellitus with diabetic chronic kidney disease: Secondary | ICD-10-CM | POA: Diagnosis not present

## 2020-11-26 DIAGNOSIS — I5023 Acute on chronic systolic (congestive) heart failure: Secondary | ICD-10-CM | POA: Diagnosis not present

## 2020-11-26 DIAGNOSIS — I509 Heart failure, unspecified: Secondary | ICD-10-CM | POA: Diagnosis not present

## 2020-11-26 DIAGNOSIS — R0602 Shortness of breath: Secondary | ICD-10-CM | POA: Diagnosis not present

## 2020-11-26 DIAGNOSIS — J811 Chronic pulmonary edema: Secondary | ICD-10-CM | POA: Diagnosis not present

## 2020-11-26 DIAGNOSIS — I2510 Atherosclerotic heart disease of native coronary artery without angina pectoris: Secondary | ICD-10-CM | POA: Diagnosis not present

## 2020-11-26 DIAGNOSIS — J9601 Acute respiratory failure with hypoxia: Secondary | ICD-10-CM | POA: Diagnosis not present

## 2020-11-26 DIAGNOSIS — N183 Chronic kidney disease, stage 3 unspecified: Secondary | ICD-10-CM | POA: Diagnosis not present

## 2020-11-26 DIAGNOSIS — R069 Unspecified abnormalities of breathing: Secondary | ICD-10-CM | POA: Diagnosis not present

## 2020-11-26 DIAGNOSIS — I10 Essential (primary) hypertension: Secondary | ICD-10-CM | POA: Diagnosis not present

## 2020-11-26 DIAGNOSIS — Z20822 Contact with and (suspected) exposure to covid-19: Secondary | ICD-10-CM | POA: Diagnosis not present

## 2020-11-27 DIAGNOSIS — J9601 Acute respiratory failure with hypoxia: Secondary | ICD-10-CM | POA: Diagnosis not present

## 2020-11-27 DIAGNOSIS — Z7982 Long term (current) use of aspirin: Secondary | ICD-10-CM | POA: Diagnosis not present

## 2020-11-27 DIAGNOSIS — I2510 Atherosclerotic heart disease of native coronary artery without angina pectoris: Secondary | ICD-10-CM | POA: Diagnosis not present

## 2020-11-27 DIAGNOSIS — I5023 Acute on chronic systolic (congestive) heart failure: Secondary | ICD-10-CM | POA: Diagnosis not present

## 2020-11-27 DIAGNOSIS — I255 Ischemic cardiomyopathy: Secondary | ICD-10-CM | POA: Diagnosis not present

## 2020-11-27 DIAGNOSIS — E1122 Type 2 diabetes mellitus with diabetic chronic kidney disease: Secondary | ICD-10-CM | POA: Diagnosis not present

## 2020-11-27 DIAGNOSIS — Z95810 Presence of automatic (implantable) cardiac defibrillator: Secondary | ICD-10-CM | POA: Diagnosis not present

## 2020-11-27 DIAGNOSIS — Z20822 Contact with and (suspected) exposure to covid-19: Secondary | ICD-10-CM | POA: Diagnosis not present

## 2020-11-27 DIAGNOSIS — I10 Essential (primary) hypertension: Secondary | ICD-10-CM | POA: Diagnosis not present

## 2020-11-27 DIAGNOSIS — I130 Hypertensive heart and chronic kidney disease with heart failure and stage 1 through stage 4 chronic kidney disease, or unspecified chronic kidney disease: Secondary | ICD-10-CM | POA: Diagnosis not present

## 2020-11-27 DIAGNOSIS — N183 Chronic kidney disease, stage 3 unspecified: Secondary | ICD-10-CM | POA: Diagnosis not present

## 2020-11-28 DIAGNOSIS — E1122 Type 2 diabetes mellitus with diabetic chronic kidney disease: Secondary | ICD-10-CM | POA: Diagnosis not present

## 2020-11-28 DIAGNOSIS — Z95810 Presence of automatic (implantable) cardiac defibrillator: Secondary | ICD-10-CM | POA: Diagnosis not present

## 2020-11-28 DIAGNOSIS — I10 Essential (primary) hypertension: Secondary | ICD-10-CM | POA: Diagnosis not present

## 2020-11-28 DIAGNOSIS — I5023 Acute on chronic systolic (congestive) heart failure: Secondary | ICD-10-CM | POA: Diagnosis not present

## 2020-11-28 DIAGNOSIS — I130 Hypertensive heart and chronic kidney disease with heart failure and stage 1 through stage 4 chronic kidney disease, or unspecified chronic kidney disease: Secondary | ICD-10-CM | POA: Diagnosis not present

## 2020-11-28 DIAGNOSIS — Z20822 Contact with and (suspected) exposure to covid-19: Secondary | ICD-10-CM | POA: Diagnosis not present

## 2020-11-28 DIAGNOSIS — I2510 Atherosclerotic heart disease of native coronary artery without angina pectoris: Secondary | ICD-10-CM | POA: Diagnosis not present

## 2020-11-28 DIAGNOSIS — N183 Chronic kidney disease, stage 3 unspecified: Secondary | ICD-10-CM | POA: Diagnosis not present

## 2020-11-28 DIAGNOSIS — J9601 Acute respiratory failure with hypoxia: Secondary | ICD-10-CM | POA: Diagnosis not present

## 2020-11-28 DIAGNOSIS — Z7982 Long term (current) use of aspirin: Secondary | ICD-10-CM | POA: Diagnosis not present

## 2020-11-28 DIAGNOSIS — I255 Ischemic cardiomyopathy: Secondary | ICD-10-CM | POA: Diagnosis not present

## 2020-11-29 ENCOUNTER — Ambulatory Visit (INDEPENDENT_AMBULATORY_CARE_PROVIDER_SITE_OTHER): Payer: Medicare HMO | Admitting: Family Medicine

## 2020-11-29 ENCOUNTER — Encounter: Payer: Self-pay | Admitting: Family Medicine

## 2020-11-29 ENCOUNTER — Telehealth: Payer: Self-pay

## 2020-11-29 DIAGNOSIS — E785 Hyperlipidemia, unspecified: Secondary | ICD-10-CM | POA: Diagnosis not present

## 2020-11-29 DIAGNOSIS — E1122 Type 2 diabetes mellitus with diabetic chronic kidney disease: Secondary | ICD-10-CM

## 2020-11-29 DIAGNOSIS — N183 Chronic kidney disease, stage 3 unspecified: Secondary | ICD-10-CM

## 2020-11-29 DIAGNOSIS — E1169 Type 2 diabetes mellitus with other specified complication: Secondary | ICD-10-CM

## 2020-11-29 DIAGNOSIS — I152 Hypertension secondary to endocrine disorders: Secondary | ICD-10-CM | POA: Diagnosis not present

## 2020-11-29 DIAGNOSIS — E1159 Type 2 diabetes mellitus with other circulatory complications: Secondary | ICD-10-CM | POA: Diagnosis not present

## 2020-11-29 MED ORDER — ATORVASTATIN CALCIUM 80 MG PO TABS: 80.0000 mg | ORAL_TABLET | Freq: Every evening | ORAL | 3 refills | Status: AC

## 2020-11-29 NOTE — Progress Notes (Signed)
Virtual Visit via telephone Note  I connected with Joseph [name] on 11/29/20 at 1412 by telephone and verified that I am speaking with the correct person using two identifiers. Joseph [name] is currently located at home and wife are currently with her during visit. The provider, Elige Radon Marilene Vath, MD is located in their office at time of visit.  Call ended at 1434  I discussed the limitations, risks, security and privacy concerns of performing an evaluation and management service by telephone and the availability of in person appointments. I also discussed with the patient that there may be a patient responsible charge related to this service. The patient expressed understanding and agreed to proceed.   History and Present Illness: Patient was in the hospital yesterday for CHF and is doing better.  He is feeling a lot better.   Type 2 diabetes mellitus Patient comes in today for recheck of his diabetes. Patient has been currently taking no medication and BS is 110-150. Patient is currently on an ACE inhibitor/ARB. Patient has seen an ophthalmologist this year. Patient denies any issues with their feet. The symptom started onset as an adult htn and hld ARE RELATED TO DM   Hypertension Patient is currently on enalapril and metoprolol, and their blood pressure today is 132/80. Patient denies any lightheadedness or dizziness. Patient denies headaches, blurred vision, chest pains, shortness of breath, or weakness. Denies any side effects from medication and is content with current medication.   Hyperlipidemia Patient is coming in for recheck of his hyperlipidemia. The patient is currently taking fish oil and lipitor. They deny any issues with myalgias or history of liver damage from it. They deny any focal numbness or weakness or chest pain.   1. Hyperlipidemia associated with type 2 diabetes mellitus (HCC)   2. Hypertension associated with diabetes (HCC)   3. Controlled type 2 diabetes  mellitus with stage 3 chronic kidney disease, without long-term current use of insulin [facility])     Outpatient Encounter Medications as of 11/29/2020  Medication Sig  . acetaminophen (TYLENOL) 500 MG tablet Take 500 mg by mouth every 6 (six) hours as needed for pain.  Marland Kitchen albuterol (VENTOLIN HFA) 108 (90 Base) MCG/ACT inhaler Inhale 2 puffs into the lungs every 4 (four) hours as needed for wheezing or shortness of breath.  Marland Kitchen aspirin EC 81 MG tablet Take 81 mg by mouth every evening. Swallow whole.  Marland Kitchen atorvastatin (LIPITOR) 80 MG tablet Take 1 tablet (80 mg total) by mouth every evening.  . enalapril (VASOTEC) 20 MG tablet TAKE 1 TABLET BY MOUTH TWICE A DAY  . fish oil-omega-3 fatty acids 1000 MG capsule Take 1 g by mouth 3 (three) times daily.   . hydrOXYzine (VISTARIL) 25 MG capsule Take 1 capsule (25 mg total) by mouth 3 (three) times daily as needed.  Marland Kitchen KLOR-CON M20 20 MEQ tablet TAKE 1 TABLET BY MOUTH EVERY DAY  . metoprolol succinate (TOPROL-XL) 100 MG 24 hr tablet Take 1 tablet (100 mg total) by mouth daily. Take with or immediately following a meal.  . Multiple Vitamin (MULTIVITAMIN WITH MINERALS) TABS tablet Take 1 tablet by mouth daily.  . nitroGLYCERIN (NITROSTAT) 0.4 MG SL tablet Place 1 tablet (0.4 mg total) under the tongue every 5 (five) minutes as needed for chest pain.  Marland Kitchen omeprazole (PRILOSEC) 20 MG capsule TAKE 1 CAPSULE BY MOUTH EVERY DAY  . torsemide (DEMADEX) 20 MG tablet Take 1.5 tablets (30 mg total) by mouth daily.  . [  DISCONTINUED] atorvastatin (LIPITOR) 80 MG tablet TAKE 1 TABLET BY MOUTH EVERY DAY IN THE EVENING   No facility-administered encounter medications on file as of 11/29/2020.    Review of Systems  Constitutional: Negative for chills and fever.  Eyes: Negative for visual disturbance.  Respiratory: Negative for shortness of breath and wheezing.   Cardiovascular: Positive for leg swelling. Negative for chest pain and palpitations.  Musculoskeletal: Negative for  back pain and gait problem.  Skin: Negative for rash.  Neurological: Negative for dizziness, weakness, light-headedness and numbness.  All other systems reviewed and are negative.   Observations/Objective: Patient sounds comfortable and in no acute distress  Assessment and Plan: Problem List Items Addressed This Visit      Cardiovascular and Mediastinum   Hypertension associated with diabetes (HCC)   Relevant Medications   atorvastatin (LIPITOR) 80 MG tablet     Endocrine   Hyperlipidemia associated with type 2 diabetes mellitus (HCC) - Primary   Relevant Medications   atorvastatin (LIPITOR) 80 MG tablet   Controlled diabetes mellitus with stage 3 chronic kidney disease (HCC)   Relevant Medications   atorvastatin (LIPITOR) 80 MG tablet      Continue current medication Follow up plan: Return in about 3 months (around 02/27/2021), or if symptoms worsen or fail to improve, for dm and htn and hld.     I discussed the assessment and treatment plan with the patient. The patient was provided an opportunity to ask questions and all were answered. The patient agreed with the plan and demonstrated an understanding of the instructions.   The patient was advised to call back or seek an in-person evaluation if the symptoms worsen or if the condition fails to improve as anticipated.  The above assessment and management plan was discussed with the patient. The patient verbalized understanding of and has agreed to the management plan. Patient is aware to call the clinic if symptoms persist or worsen. Patient is aware when to return to the clinic for a follow-up visit. Patient educated on when it is appropriate to go to the emergency department.    I provided 21 minutes of non-face-to-face time during this encounter.    [name], MD

## 2020-11-29 NOTE — Telephone Encounter (Signed)
Contact Date: 11/29/2020 Contacted By: Mariam Dollar, LPN  Transition Care Management Follow-up Telephone Call  Date of discharge and from where: 11/28/2020  Discharge Diagnosis: Acute on chronic heart failure  How have you been since you were released from the hospital? Doing well  Any questions or concerns? No   Items Reviewed:  Did the pt receive and understand the discharge instructions provided? Yes   Medications obtained and verified? Yes   Any new allergies since your discharge? No   Dietary orders reviewed? Yes  Do you have support at home? Yes   Discontinued Medications none New Medications Added Torsemide 20 mg, two twice a day  Current Medication List Allergies as of 11/29/2020      Reactions   Entresto [sacubitril-valsartan]    Worsening renal function and weight gain      Medication List       Accurate as of November 29, 2020 11:17 AM. If you have any questions, ask your nurse or doctor.        acetaminophen 500 MG tablet Commonly known as: TYLENOL Take 500 mg by mouth every 6 (six) hours as needed for pain.   albuterol 108 (90 Base) MCG/ACT inhaler Commonly known as: VENTOLIN HFA Inhale 2 puffs into the lungs every 4 (four) hours as needed for wheezing or shortness of breath.   aspirin EC 81 MG tablet Take 81 mg by mouth every evening. Swallow whole.   atorvastatin 80 MG tablet Commonly known as: LIPITOR TAKE 1 TABLET BY MOUTH EVERY DAY IN THE EVENING   enalapril 20 MG tablet Commonly known as: VASOTEC TAKE 1 TABLET BY MOUTH TWICE A DAY   fish oil-omega-3 fatty acids 1000 MG capsule Take 1 g by mouth 3 (three) times daily.   hydrOXYzine 25 MG capsule Commonly known as: VISTARIL Take 1 capsule (25 mg total) by mouth 3 (three) times daily as needed.   Klor-Con M20 20 MEQ tablet Generic drug: potassium chloride SA TAKE 1 TABLET BY MOUTH EVERY DAY   metoprolol succinate 100 MG 24 hr tablet Commonly known as: TOPROL-XL Take 1 tablet (100 mg  total) by mouth daily. Take with or immediately following a meal.   multivitamin with minerals Tabs tablet Take 1 tablet by mouth daily.   nitroGLYCERIN 0.4 MG SL tablet Commonly known as: NITROSTAT Place 1 tablet (0.4 mg total) under the tongue every 5 (five) minutes as needed for chest pain.   omeprazole 20 MG capsule Commonly known as: PRILOSEC TAKE 1 CAPSULE BY MOUTH EVERY DAY   torsemide 20 MG tablet Commonly known as: DEMADEX Take 1.5 tablets (30 mg total) by mouth daily.        Home Care and Equipment/Supplies: Were home health services ordered? no If so, what is the name of the agency? NA  Has the agency set up a time to come to the patient's home? not applicable Were any new equipment or medical supplies ordered?  No What is the name of the medical supply agency? NA Were you able to get the supplies/equipment? not applicable Do you have any questions related to the use of the equipment or supplies? No  Functional Questionnaire: (I = Independent and D = Dependent) ADLs: I  Bathing/Dressing- I  Meal Prep- I  Eating- I  Maintaining continence- I  Transferring/Ambulation- I  Managing Meds- I Follow up appointments reviewed:   PCP Hospital f/u appt confirmed? Yes  Scheduled to see Dr. Louanne Skye on 11/29/2020 for 3 month followup .  [facility]  f/u appt confirmed? not applicable  Are transportation arrangements needed? No   If their condition worsens, is the pt aware to call PCP or go to the Emergency Dept.? Yes  Was the patient provided with contact information for the PCP's office or ED? Yes  Was to pt encouraged to call back with questions or concerns? Yes

## 2020-11-30 DIAGNOSIS — N1831 Chronic kidney disease, stage 3a: Secondary | ICD-10-CM | POA: Diagnosis not present

## 2020-11-30 DIAGNOSIS — I129 Hypertensive chronic kidney disease with stage 1 through stage 4 chronic kidney disease, or unspecified chronic kidney disease: Secondary | ICD-10-CM | POA: Diagnosis not present

## 2020-11-30 DIAGNOSIS — I5043 Acute on chronic combined systolic (congestive) and diastolic (congestive) heart failure: Secondary | ICD-10-CM | POA: Diagnosis not present

## 2020-11-30 DIAGNOSIS — E211 Secondary hyperparathyroidism, not elsewhere classified: Secondary | ICD-10-CM | POA: Diagnosis not present

## 2020-12-15 ENCOUNTER — Other Ambulatory Visit: Payer: Medicare HMO

## 2020-12-15 ENCOUNTER — Other Ambulatory Visit: Payer: Self-pay

## 2020-12-15 DIAGNOSIS — E211 Secondary hyperparathyroidism, not elsewhere classified: Secondary | ICD-10-CM | POA: Diagnosis not present

## 2020-12-15 DIAGNOSIS — I5042 Chronic combined systolic (congestive) and diastolic (congestive) heart failure: Secondary | ICD-10-CM | POA: Diagnosis not present

## 2020-12-15 DIAGNOSIS — N1831 Chronic kidney disease, stage 3a: Secondary | ICD-10-CM | POA: Diagnosis not present

## 2020-12-15 DIAGNOSIS — I129 Hypertensive chronic kidney disease with stage 1 through stage 4 chronic kidney disease, or unspecified chronic kidney disease: Secondary | ICD-10-CM | POA: Diagnosis not present

## 2020-12-16 DIAGNOSIS — E211 Secondary hyperparathyroidism, not elsewhere classified: Secondary | ICD-10-CM | POA: Diagnosis not present

## 2020-12-16 DIAGNOSIS — I5042 Chronic combined systolic (congestive) and diastolic (congestive) heart failure: Secondary | ICD-10-CM | POA: Diagnosis not present

## 2020-12-16 DIAGNOSIS — N1832 Chronic kidney disease, stage 3b: Secondary | ICD-10-CM | POA: Diagnosis not present

## 2020-12-16 DIAGNOSIS — I129 Hypertensive chronic kidney disease with stage 1 through stage 4 chronic kidney disease, or unspecified chronic kidney disease: Secondary | ICD-10-CM | POA: Diagnosis not present

## 2020-12-18 DIAGNOSIS — I5023 Acute on chronic systolic (congestive) heart failure: Secondary | ICD-10-CM | POA: Diagnosis not present

## 2020-12-18 DIAGNOSIS — R069 Unspecified abnormalities of breathing: Secondary | ICD-10-CM | POA: Diagnosis not present

## 2020-12-18 DIAGNOSIS — J9601 Acute respiratory failure with hypoxia: Secondary | ICD-10-CM | POA: Diagnosis not present

## 2020-12-18 DIAGNOSIS — R0902 Hypoxemia: Secondary | ICD-10-CM | POA: Diagnosis not present

## 2020-12-18 DIAGNOSIS — J189 Pneumonia, unspecified organism: Secondary | ICD-10-CM | POA: Diagnosis not present

## 2020-12-18 DIAGNOSIS — I130 Hypertensive heart and chronic kidney disease with heart failure and stage 1 through stage 4 chronic kidney disease, or unspecified chronic kidney disease: Secondary | ICD-10-CM | POA: Diagnosis not present

## 2020-12-18 DIAGNOSIS — R21 Rash and other nonspecific skin eruption: Secondary | ICD-10-CM | POA: Diagnosis not present

## 2020-12-18 DIAGNOSIS — I2510 Atherosclerotic heart disease of native coronary artery without angina pectoris: Secondary | ICD-10-CM | POA: Diagnosis not present

## 2020-12-18 DIAGNOSIS — J449 Chronic obstructive pulmonary disease, unspecified: Secondary | ICD-10-CM | POA: Diagnosis not present

## 2020-12-18 DIAGNOSIS — E1122 Type 2 diabetes mellitus with diabetic chronic kidney disease: Secondary | ICD-10-CM | POA: Diagnosis not present

## 2020-12-18 DIAGNOSIS — I493 Ventricular premature depolarization: Secondary | ICD-10-CM | POA: Diagnosis not present

## 2020-12-18 DIAGNOSIS — I509 Heart failure, unspecified: Secondary | ICD-10-CM | POA: Diagnosis not present

## 2020-12-18 DIAGNOSIS — J90 Pleural effusion, not elsewhere classified: Secondary | ICD-10-CM | POA: Diagnosis not present

## 2020-12-18 DIAGNOSIS — Z87891 Personal history of nicotine dependence: Secondary | ICD-10-CM | POA: Diagnosis not present

## 2020-12-18 DIAGNOSIS — I517 Cardiomegaly: Secondary | ICD-10-CM | POA: Diagnosis not present

## 2020-12-18 DIAGNOSIS — R0689 Other abnormalities of breathing: Secondary | ICD-10-CM | POA: Diagnosis not present

## 2020-12-18 DIAGNOSIS — Z20822 Contact with and (suspected) exposure to covid-19: Secondary | ICD-10-CM | POA: Diagnosis not present

## 2020-12-18 DIAGNOSIS — R0602 Shortness of breath: Secondary | ICD-10-CM | POA: Diagnosis not present

## 2020-12-18 DIAGNOSIS — R000 Tachycardia, unspecified: Secondary | ICD-10-CM | POA: Diagnosis not present

## 2020-12-18 DIAGNOSIS — I429 Cardiomyopathy, unspecified: Secondary | ICD-10-CM | POA: Diagnosis not present

## 2020-12-18 DIAGNOSIS — N1831 Chronic kidney disease, stage 3a: Secondary | ICD-10-CM | POA: Diagnosis not present

## 2020-12-18 DIAGNOSIS — I248 Other forms of acute ischemic heart disease: Secondary | ICD-10-CM | POA: Diagnosis not present

## 2020-12-19 DIAGNOSIS — Z951 Presence of aortocoronary bypass graft: Secondary | ICD-10-CM | POA: Diagnosis not present

## 2020-12-19 DIAGNOSIS — I5089 Other heart failure: Secondary | ICD-10-CM | POA: Diagnosis not present

## 2020-12-19 DIAGNOSIS — Z20822 Contact with and (suspected) exposure to covid-19: Secondary | ICD-10-CM | POA: Diagnosis not present

## 2020-12-19 DIAGNOSIS — J449 Chronic obstructive pulmonary disease, unspecified: Secondary | ICD-10-CM | POA: Diagnosis not present

## 2020-12-19 DIAGNOSIS — N183 Chronic kidney disease, stage 3 unspecified: Secondary | ICD-10-CM | POA: Diagnosis not present

## 2020-12-19 DIAGNOSIS — N1831 Chronic kidney disease, stage 3a: Secondary | ICD-10-CM | POA: Diagnosis not present

## 2020-12-19 DIAGNOSIS — I429 Cardiomyopathy, unspecified: Secondary | ICD-10-CM | POA: Diagnosis not present

## 2020-12-19 DIAGNOSIS — K219 Gastro-esophageal reflux disease without esophagitis: Secondary | ICD-10-CM | POA: Diagnosis not present

## 2020-12-19 DIAGNOSIS — E7800 Pure hypercholesterolemia, unspecified: Secondary | ICD-10-CM | POA: Diagnosis not present

## 2020-12-19 DIAGNOSIS — I248 Other forms of acute ischemic heart disease: Secondary | ICD-10-CM | POA: Diagnosis not present

## 2020-12-19 DIAGNOSIS — I509 Heart failure, unspecified: Secondary | ICD-10-CM | POA: Diagnosis not present

## 2020-12-19 DIAGNOSIS — E1122 Type 2 diabetes mellitus with diabetic chronic kidney disease: Secondary | ICD-10-CM | POA: Diagnosis not present

## 2020-12-19 DIAGNOSIS — I5023 Acute on chronic systolic (congestive) heart failure: Secondary | ICD-10-CM | POA: Diagnosis not present

## 2020-12-19 DIAGNOSIS — I255 Ischemic cardiomyopathy: Secondary | ICD-10-CM | POA: Diagnosis not present

## 2020-12-19 DIAGNOSIS — I130 Hypertensive heart and chronic kidney disease with heart failure and stage 1 through stage 4 chronic kidney disease, or unspecified chronic kidney disease: Secondary | ICD-10-CM | POA: Diagnosis not present

## 2020-12-19 DIAGNOSIS — I2510 Atherosclerotic heart disease of native coronary artery without angina pectoris: Secondary | ICD-10-CM | POA: Diagnosis not present

## 2020-12-19 DIAGNOSIS — J9601 Acute respiratory failure with hypoxia: Secondary | ICD-10-CM | POA: Diagnosis not present

## 2020-12-20 DIAGNOSIS — I2510 Atherosclerotic heart disease of native coronary artery without angina pectoris: Secondary | ICD-10-CM | POA: Diagnosis not present

## 2020-12-20 DIAGNOSIS — I10 Essential (primary) hypertension: Secondary | ICD-10-CM | POA: Diagnosis not present

## 2020-12-20 DIAGNOSIS — I5023 Acute on chronic systolic (congestive) heart failure: Secondary | ICD-10-CM | POA: Diagnosis not present

## 2020-12-20 DIAGNOSIS — N183 Chronic kidney disease, stage 3 unspecified: Secondary | ICD-10-CM | POA: Diagnosis not present

## 2020-12-21 ENCOUNTER — Telehealth: Payer: Self-pay

## 2020-12-21 ENCOUNTER — Telehealth: Payer: Self-pay | Admitting: *Deleted

## 2020-12-21 ENCOUNTER — Other Ambulatory Visit: Payer: Self-pay | Admitting: Student

## 2020-12-21 DIAGNOSIS — I5042 Chronic combined systolic (congestive) and diastolic (congestive) heart failure: Secondary | ICD-10-CM

## 2020-12-21 NOTE — Telephone Encounter (Signed)
Contact Date: 12/21/2020 Contacted By: Hilton Cork, LPN   Transition Care Management Follow-up Telephone Call  Date of discharge and from where: [facility]/20/[facility]  Discharge Diagnosis:CKD  How have you been since you were released from the hospital? fair  Any questions or concerns? No   Items Reviewed:  Did the pt receive and understand the discharge instructions provided? Yes   Medications obtained and verified? Yes   Any new allergies since your discharge? No   Dietary orders reviewed? Yes  Do you have support at home? Yes   Discontinued Medications None New Medications Added None  Current Medication List Allergies as of 12/21/2020      Reactions   Entresto [sacubitril-valsartan]    Worsening renal function and weight gain      Medication List       Accurate as of December 21, 2020 11:52 AM. If you have any questions, ask your nurse or doctor.        acetaminophen 500 MG tablet Commonly known as: TYLENOL Take 500 mg by mouth every 6 (six) hours as needed for pain.   albuterol 108 (90 Base) MCG/ACT inhaler Commonly known as: VENTOLIN HFA Inhale 2 puffs into the lungs every 4 (four) hours as needed for wheezing or shortness of breath.   aspirin EC 81 MG tablet Take 81 mg by mouth every evening. Swallow whole.   atorvastatin 80 MG tablet Commonly known as: LIPITOR Take 1 tablet (80 mg total) by mouth every evening.   enalapril 20 MG tablet Commonly known as: VASOTEC TAKE 1 TABLET BY MOUTH TWICE A DAY   fish oil-omega-3 fatty acids 1000 MG capsule Take 1 g by mouth 3 (three) times daily.   hydrOXYzine 25 MG capsule Commonly known as: VISTARIL Take 1 capsule (25 mg total) by mouth 3 (three) times daily as needed.   Klor-Con M20 20 MEQ tablet Generic drug: potassium chloride SA TAKE 1 TABLET BY MOUTH EVERY DAY   metoprolol succinate 100 MG 24 hr tablet Commonly known as: TOPROL-XL Take 1 tablet (100 mg total) by mouth daily. Take with or  immediately following a meal.   multivitamin with minerals Tabs tablet Take 1 tablet by mouth daily.   nitroGLYCERIN 0.4 MG SL tablet Commonly known as: NITROSTAT Place 1 tablet (0.4 mg total) under the tongue every 5 (five) minutes as needed for chest pain.   omeprazole 20 MG capsule Commonly known as: PRILOSEC TAKE 1 CAPSULE BY MOUTH EVERY DAY   torsemide 20 MG tablet Commonly known as: DEMADEX Take 1.5 tablets (30 mg total) by mouth daily.        Home Care and Equipment/Supplies: Were home health services ordered? no If so, what is the name of the agency?   Has the agency set up a time to come to the patient's home? no Were any new equipment or medical supplies ordered?  No What is the name of the medical supply agency?  Were you able to get the supplies/equipment? not applicable Do you have any questions related to the use of the equipment or supplies? No  Functional Questionnaire: (I = Independent and D = Dependent) ADLs: I  Bathing/Dressing- I  Meal Prep- I  Eating- I  Maintaining continence- I  Transferring/Ambulation- I  Managing Meds- I  Follow up appointments reviewed:   PCP Hospital f/u appt confirmed? Yes  Scheduled to see Dr. Louanne Skye on 01/03/21@ 10:55am  Specialist Hospital f/u appt confirmed? No  Scheduled to see   Are transportation arrangements needed?  No   If their condition worsens, is the pt aware to call PCP or go to the Emergency Dept.? Yes  Was the patient provided with contact information for the PCP's office or ED? Yes  Was to pt encouraged to call back with questions or concerns? Yes

## 2020-12-21 NOTE — Telephone Encounter (Signed)
TOC made for 01/03/21, pt aware

## 2020-12-30 ENCOUNTER — Ambulatory Visit (HOSPITAL_COMMUNITY)
Admission: RE | Admit: 2020-12-30 | Discharge: 2020-12-30 | Disposition: A | Discharge status: Home / Self Care | Payer: Medicare HMO | Source: Ambulatory Visit | Attending: Internal Medicine | Admitting: Internal Medicine

## 2020-12-30 ENCOUNTER — Other Ambulatory Visit: Payer: Self-pay

## 2020-12-30 VITALS — BP 100/68 | HR 67 | Wt 181.2 lb

## 2020-12-30 DIAGNOSIS — I5022 Chronic systolic (congestive) heart failure: Secondary | ICD-10-CM | POA: Diagnosis not present

## 2020-12-30 DIAGNOSIS — N1831 Chronic kidney disease, stage 3a: Secondary | ICD-10-CM | POA: Insufficient documentation

## 2020-12-30 DIAGNOSIS — Z951 Presence of aortocoronary bypass graft: Secondary | ICD-10-CM | POA: Insufficient documentation

## 2020-12-30 DIAGNOSIS — J449 Chronic obstructive pulmonary disease, unspecified: Secondary | ICD-10-CM | POA: Insufficient documentation

## 2020-12-30 DIAGNOSIS — I2510 Atherosclerotic heart disease of native coronary artery without angina pectoris: Secondary | ICD-10-CM | POA: Diagnosis not present

## 2020-12-30 DIAGNOSIS — I130 Hypertensive heart and chronic kidney disease with heart failure and stage 1 through stage 4 chronic kidney disease, or unspecified chronic kidney disease: Secondary | ICD-10-CM | POA: Insufficient documentation

## 2020-12-30 DIAGNOSIS — Z7982 Long term (current) use of aspirin: Secondary | ICD-10-CM | POA: Diagnosis not present

## 2020-12-30 DIAGNOSIS — N1832 Chronic kidney disease, stage 3b: Secondary | ICD-10-CM

## 2020-12-30 DIAGNOSIS — I5042 Chronic combined systolic (congestive) and diastolic (congestive) heart failure: Secondary | ICD-10-CM | POA: Diagnosis not present

## 2020-12-30 DIAGNOSIS — Z79899 Other long term (current) drug therapy: Secondary | ICD-10-CM | POA: Insufficient documentation

## 2020-12-30 DIAGNOSIS — Z8249 Family history of ischemic heart disease and other diseases of the circulatory system: Secondary | ICD-10-CM | POA: Insufficient documentation

## 2020-12-30 DIAGNOSIS — Z95810 Presence of automatic (implantable) cardiac defibrillator: Secondary | ICD-10-CM | POA: Diagnosis not present

## 2020-12-30 DIAGNOSIS — E785 Hyperlipidemia, unspecified: Secondary | ICD-10-CM | POA: Insufficient documentation

## 2020-12-30 DIAGNOSIS — Z87891 Personal history of nicotine dependence: Secondary | ICD-10-CM | POA: Diagnosis not present

## 2020-12-30 DIAGNOSIS — E1122 Type 2 diabetes mellitus with diabetic chronic kidney disease: Secondary | ICD-10-CM | POA: Diagnosis not present

## 2020-12-30 LAB — BASIC METABOLIC PANEL
Anion gap: 13 (ref 5–15)
BUN: 36 mg/dL — ABNORMAL HIGH (ref 8–23)
CO2: 20 mmol/L — ABNORMAL LOW (ref 22–32)
Calcium: 9.3 mg/dL (ref 8.9–10.3)
Chloride: 100 mmol/L (ref 98–111)
Creatinine, Ser: 2.49 mg/dL — ABNORMAL HIGH (ref 0.61–1.24)
GFR, Estimated: 28 mL/min — ABNORMAL LOW (ref >60)
Glucose, Bld: 106 mg/dL — ABNORMAL HIGH (ref 70–99)
Potassium: 5.1 mmol/L (ref 3.5–5.1)
Sodium: 133 mmol/L — ABNORMAL LOW (ref 135–145)

## 2020-12-30 LAB — BRAIN NATRIURETIC PEPTIDE: B Natriuretic Peptide: 34.2 pg/mL (ref 0.0–100.0)

## 2020-12-30 MED ORDER — EMPAGLIFLOZIN 10 MG PO TABS: 10.0000 mg | ORAL_TABLET | Freq: Every day | ORAL | 6 refills | Status: DC

## 2020-12-30 MED ORDER — TORSEMIDE 20 MG PO TABS: 20.0000 mg | ORAL_TABLET | Freq: Every day | ORAL | Status: DC

## 2020-12-30 NOTE — Patient Instructions (Signed)
Decrease Torsemide to 20 mg Daily  Start Jardiance 10 mg Daily  Your provider has prescribed Jardiance for you. Please be aware the most common side effect of this medication is urinary tract infections and yeast infections. Please practice good hygiene and keep this area clean and dry to help prevent this. If you do begin to have symptoms of these infections, such as difficulty urinating or painful urination,  please let Joseph [name] know.  Labs done today, your results will be available in MyChart, we will contact you for abnormal readings.  Your physician recommends that you schedule a follow-up appointment in: 4-6 weeks  If you have any questions or concerns before your next appointment please send Joseph [name] a message through [facility] or call our office at [phone].    TO LEAVE A MESSAGE FOR THE NURSE SELECT OPTION 2, PLEASE LEAVE A MESSAGE INCLUDING: . YOUR NAME . DATE OF BIRTH . CALL BACK NUMBER . REASON FOR CALL**this is important as we prioritize the call backs  YOU WILL RECEIVE A CALL BACK THE SAME DAY AS LONG AS YOU CALL BEFORE 4:00 PM  At the [facility], you and your health needs are our priority. As part of our continuing mission to provide you with exceptional heart care, we have created designated Provider Care Teams. These Care Teams include your primary Cardiologist (physician) and Advanced Practice Providers (APPs- Physician Assistants and Nurse Practitioners) who all work together to provide you with the care you need, when you need it.   You may see any of the following providers on your designated Care Team at your next follow up: Marland Kitchen Dr Arvilla Meres . Dr Marca Ancona . Tonye Becket, NP . Robbie Lis, PA . Shanda Bumps [place],NP . Karle Plumber, PharmD   Please be sure to bring in all your medications bottles to every appointment.

## 2020-12-30 NOTE — Progress Notes (Addendum)
[facility] CONSULT NOTE  Referring Physician: Dr. Diona Browner Primary Care: [name], Elige Radon, MD Primary Cardiologist: Dr. Antoine Poche  HPI:  Joseph [name] is a 65 y.o. male withCAD (s/p CABG in 2000, Mvoview in 05/2008 showing scar with no ischemia, patent grafts in 2011), chronic combined systolic and diastolic CHF (EF25-30% in 2016and 35-40% by echo in 08/2020, s/p St. Jude ICD placement in 2011), HTN, HLD, Type 2 DM (diet-controlled) and Stage 3a CKD who is referred by Dr. Diona Browner for further evaluation of his HF.  He recently has had multiple ED visits for acute CHF exacerbations. Underwent R/L cath on 09/23/2020 and showed severe native three-vessel CAD with patent LIMA to LAD, seq SVG-OM1-OM2, SVG-D1 and SVG-PDA.  He did have progression of distal LAD stenosis beyond the LIMA insertion site but continued medical therapy was recommended. Right heart hemodynamics were normal with normal filling pressures and preserved cardiac output.  He is here with his daughter. 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 Feeling ok. Over past month has had 2 episodes with acute onset of SOB with pink frothy sputum and drop in sats suggestive of acute pulmonary edema. Not associated with CP.   Since the last episode he has drastically changed his diet. Less fluid intake. Weight down from 180 to 174 pounds. Now taking torsemide 20 bid. (recently was taking 60 daily but dropped back because creatinine increased to 1.62 (previously 1.1 - 1.3 in 08/2020)  Aside from 3 episdoes of flash edema has done quite well. Active with no DOE, orthopnea or PND. Mild LE edema.Overall weight down 10 pounds since early January. Takes BP every day typically 110-140. This am SBP 96.    Was on Entresto in past for almost a year but stopped due to AKI. Has not been on spiro or SGLT2i    Review of Systems: [y] = yes, [ ]  = no   General: Weight gain [ ] ; Weight loss [ ] ; Anorexia [ ] ; Fatigue [ ] ; Fever  [ ] ; Chills [ ] ; Weakness [ ]   Cardiac: Chest pain/pressure [ ] ; Resting SOB [ ] ; Exertional SOB [ ] ; Orthopnea [ ] ; Pedal Edema [ ] ; Palpitations [ ] ; Syncope [ ] ; Presyncope [ ] ; Paroxysmal nocturnal dyspnea[ ]   Pulmonary: Cough [ ] ; Wheezing[ ] ; Hemoptysis[ ] ; Sputum [ ] ; Snoring [ ]   GI: Vomiting[ ] ; Dysphagia[ ] ; Melena[ ] ; Hematochezia [ ] ; Heartburn[ ] ; Abdominal pain [ ] ; Constipation [ ] ; Diarrhea [ ] ; BRBPR [ ]   GU: Hematuria[ ] ; Dysuria [ ] ; Nocturia[ ]   Vascular: Pain in legs with walking [ ] ; Pain in feet with lying flat [ ] ; Non-healing sores [ ] ; Stroke [ ] ; TIA [ ] ; Slurred speech [ ] ;  Neuro: Headaches[ ] ; Vertigo[ ] ; Seizures[ ] ; Paresthesias[ ] ;Blurred vision [ ] ; Diplopia [ ] ; Vision changes [ ]   Ortho/Skin: Arthritis [ ] ; Joint pain [ ] ; Muscle pain [ ] ; Joint swelling [ ] ; Back Pain [ ] ; Rash [ ]   Psych: Depression[ ] ; Anxiety[ ]   Heme: Bleeding problems [ ] ; Clotting disorders [ ] ; Anemia [ ]   Endocrine: Diabetes [ ] ; Thyroid dysfunction[ ]    Past Medical History:  Diagnosis Date  . CHF (congestive heart failure) (HCC)    a. EF 25-30% in 2016 b. 35-40% by echo in 10/2017 c. 35-40% in 08/2020  . COPD (chronic obstructive pulmonary disease) (HCC)   . Coronary artery disease    a. s/p CABG in 2000 b. patent grafts in 2011 c. 08/2020:  repeat cath showing severe native CAD with patent bypass grafts --> medical therapy recommended  . GI bleed    Mallory-Weiss tear EGD 02/2010  . Hyperlipidemia   . Hypertension   . Ileus (HCC) 2006   required hospitalization  . Left ventricular dysfunction    apical thrombus  . MRSA bacteremia   . 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 Jude  . Small bowel obstruction (HCC) 2007   resolved without surgery    Current Outpatient Medications  Medication Sig Dispense Refill  . acetaminophen (TYLENOL) 500 MG tablet Take 500 mg by mouth every 6 (six) hours as needed for pain.    Marland Kitchen  albuterol (VENTOLIN HFA) 108 (90 Base) MCG/ACT inhaler Inhale 2 puffs into the lungs every 4 (four) hours as needed for wheezing or shortness of breath. 6.7 g 0  . aspirin EC 81 MG tablet Take 81 mg by mouth every evening. Swallow whole.    Marland Kitchen atorvastatin (LIPITOR) 80 MG tablet Take 1 tablet (80 mg total) by mouth every evening. 90 tablet 3  . enalapril (VASOTEC) 20 MG tablet TAKE 1 TABLET BY MOUTH TWICE A DAY 180 tablet 3  . fish oil-omega-3 fatty acids 1000 MG capsule Take 1 g by mouth 3 (three) times daily.     . hydrOXYzine (VISTARIL) 25 MG capsule Take 1 capsule (25 mg total) by mouth 3 (three) times daily as needed. 30 capsule 0  . KLOR-CON M20 20 MEQ tablet TAKE 1 TABLET BY MOUTH EVERY DAY 90 tablet 3  . metoprolol succinate (TOPROL-XL) 100 MG 24 hr tablet Take 1 tablet (100 mg total) by mouth daily. Take with or immediately following a meal. 90 tablet 3  . Multiple Vitamin (MULTIVITAMIN WITH MINERALS) TABS tablet Take 1 tablet by mouth daily.    . nitroGLYCERIN (NITROSTAT) 0.4 MG SL tablet Place 1 tablet (0.4 mg total) under the tongue every 5 (five) minutes as needed for chest pain. 25 tablet 3  . omeprazole (PRILOSEC) 20 MG capsule TAKE 1 CAPSULE BY MOUTH EVERY DAY 90 capsule 3  . torsemide (DEMADEX) 20 MG tablet Take 1.5 tablets (30 mg total) by mouth daily. 135 tablet 3   No current facility-administered medications for this encounter.    Allergies  Allergen Reactions  . Entresto [Sacubitril-Valsartan]     Worsening renal function and weight gain      Social History   Socioeconomic History  . Marital status: Married    Spouse name: Kathie Rhodes  . Number of children: 3  . Years of education: GED  . Highest education level: Not on file  Occupational History  . 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 Smoking status: Former Smoker    Types: Cigarettes    Start date: 11/28/1967    Quit date: 02/29/1988    Years since quitting: 32.8  . Smokeless tobacco:  Never Used  Vaping Use  . Vaping Use: Never used  Substance and Sexual Activity  . Alcohol use: No    Alcohol/week: 0.0 standard drinks  . Drug use: No  . Sexual activity: Never  Other Topics Concern  . Not on file  Social History Narrative   1 son died age 42-Lee's syndrome         Social Determinants of Health   Financial Resource Strain: Low Risk   . Difficulty of Paying Living Expenses: Not very hard  Food Insecurity: No Food Insecurity  . Worried About  Running Out of Food in the Last Year: Never true  . Ran Out of Food in the Last Year: Never true  Transportation Needs: No Transportation Needs  . Lack of Transportation (Medical): No  . Lack of Transportation (Non-Medical): No  Physical Activity: Insufficiently Active  . Days of Exercise per Week: 3 days  . Minutes of Exercise per Session: 30 min  Stress: No Stress Concern Present  . Feeling of Stress : Only a little  Social Connections: Socially Integrated  . Frequency of Communication with Friends and Family: More than three times a week  . Frequency of Social Gatherings with Friends and Family: More than three times a week  . Attends Religious Services: More than 4 times per year  . Active Member of Clubs or Organizations: Yes  . Attends Banker Meetings: More than 4 times per year  . Marital Status: Married  Catering manager Violence: Not At Risk  . Fear of Current or Ex-Partner: No  . Emotionally Abused: No  . Physically Abused: No  . Sexually Abused: No      Family History  Problem Relation Age of Onset  . Heart attack Mother   . Emphysema Father   . Coronary artery disease Father        s/p cabg  . Colon cancer Neg Hx     Vitals:   12/30/20 1410  BP: 100/68  Pulse: 67  SpO2: 99%  Weight: 82.2 kg    PHYSICAL EXAM: General:  Well appearing. No respiratory difficulty HEENT: normal Neck: supple. no JVD. Carotids 2+ bilat; no bruits. No lymphadenopathy or thryomegaly  appreciated. Cor: PMI nondisplaced. Regular rate & rhythm. No rubs, gallops or murmurs. Lungs: clear Abdomen: soft, nontender, nondistended. No hepatosplenomegaly. No bruits or masses. Good bowel sounds. Extremities: no cyanosis, clubbing, rash, edema Neuro: alert & oriented x 3, cranial nerves grossly intact. moves all 4 extremities w/o difficulty. Affect pleasant.  ECG (09/08/20): NSR 85 septal qs. Nonspecific ST-T abnormalities Personally reviewed    ASSESSMENT & PLAN:  1. 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 Patent grafts. RHC ok - s/p SJ ICD - NYHA II-III - Volume status now much improved by dropping dry weight from 181 to 174  - Continue Toprol-XL 100mg  daily  - Continue Enalapril 20mg  BID. Previously intolerant to .  - Add Jardiance 10 mg daily - Consider spiro in near future - Drop Torsemide to 20 daily - Long discussion about use of sliding scale diuretics to keep his dry weight under 178. We will have to balance this with his renal insufficiency as he may have very narrow euvolemic window - Can consider cardiomems as needed - If symptoms worsen -> low threshold for CPX testing - ICD interrogated. Corevue is dry. No VT. Personally reviewed  2. CAD - He is s/p CABG in 2000  - cardiac catheterization 10/21 showed severe native three-vessel CAD with patent LIMA to LAD, seq SVG-OM1-OM2, SVG-D1 and SVG-PDA. He did have progression of distal LAD stenosis beyond the LIMA insertion site but continued medical therapy was recommended. - No s/s angina - Continue current medication regimen with ASA, Atorvastatin, Toprol-XL and SL NTG.   3. HTN - Blood pressure well controlled. Continue current regimen.  4. Stage 3a CKD with recent AKI - Baseline creatinine 1.1-1.3 in 10/21 but had recent AKI with increas to 1.62 by most recent  labs Torsemide decreased.  - Recheck  today  Total time spent 45 minutes. Over half that time spent discussing above.   Arvilla Meres, MD  12:18 PM   Addendum:  Creatinine up to 2.5 today. Stop torsemide today and use only prn for weight > 178. Continue jardiance. I am concerned that cardiorenal syndrome may be a barrier to managing his symptoms successfully. Hopefully addition of SGLT2i will help although we may see initial bump in creatinine with this. Repeat labs next week.   Arvilla Meres, MD  11:08 PM

## 2020-12-31 ENCOUNTER — Telehealth (HOSPITAL_COMMUNITY): Payer: Self-pay | Admitting: Cardiology

## 2020-12-31 MED ORDER — TORSEMIDE 20 MG PO TABS: 20.0000 mg | ORAL_TABLET | ORAL | 3 refills | Status: DC | PRN

## 2020-12-31 NOTE — Telephone Encounter (Signed)
-----   Message from Dolores Patty, MD sent at 12/31/2020  2:45 PM EST ----- He is over diuresed. Stop torsemide. Take torsemide 20 only when weight 177 or greater

## 2020-12-31 NOTE — Telephone Encounter (Signed)
[phone] (M) pt aware and voiced understanding

## 2021-01-03 ENCOUNTER — Ambulatory Visit (INDEPENDENT_AMBULATORY_CARE_PROVIDER_SITE_OTHER): Payer: Medicare HMO

## 2021-01-03 ENCOUNTER — Ambulatory Visit: Payer: Medicare HMO | Admitting: Family Medicine

## 2021-01-03 ENCOUNTER — Telehealth: Payer: Self-pay

## 2021-01-03 DIAGNOSIS — I255 Ischemic cardiomyopathy: Secondary | ICD-10-CM

## 2021-01-03 DIAGNOSIS — I5022 Chronic systolic (congestive) heart failure: Secondary | ICD-10-CM

## 2021-01-03 NOTE — Telephone Encounter (Signed)
Spoke with patient, rescheduled appointment to 01/14/21 with Dr. Louanne Skye.

## 2021-01-05 LAB — CUP PACEART REMOTE DEVICE CHECK
Battery Remaining Longevity: 16 mo
Battery Remaining Percentage: 13 %
Battery Voltage: 2.71 V
Brady Statistic RV Percent Paced: 1 %
Date Time Interrogation Session: 20220207040019
HighPow Impedance: 51 Ohm
Implantable Lead Implant Date: 20110209
Implantable Lead Location: 753860
Implantable Lead Model: 7120
Implantable Pulse Generator Implant Date: 20110209
Lead Channel Impedance Value: 440 Ohm
Lead Channel Pacing Threshold Amplitude: 0.75 V
Lead Channel Pacing Threshold Pulse Width: 0.4 ms
Lead Channel Sensing Intrinsic Amplitude: 12 mV
Lead Channel Setting Pacing Amplitude: 2.5 V
Lead Channel Setting Pacing Pulse Width: 0.4 ms
Lead Channel Setting Sensing Sensitivity: 0.5 mV
Pulse Gen Serial Number: 747960

## 2021-01-07 NOTE — Progress Notes (Signed)
Remote ICD transmission.   

## 2021-01-11 ENCOUNTER — Other Ambulatory Visit: Payer: Medicare HMO

## 2021-01-11 ENCOUNTER — Other Ambulatory Visit: Payer: Self-pay

## 2021-01-11 DIAGNOSIS — I5042 Chronic combined systolic (congestive) and diastolic (congestive) heart failure: Secondary | ICD-10-CM | POA: Diagnosis not present

## 2021-01-11 DIAGNOSIS — N1832 Chronic kidney disease, stage 3b: Secondary | ICD-10-CM | POA: Diagnosis not present

## 2021-01-11 DIAGNOSIS — E211 Secondary hyperparathyroidism, not elsewhere classified: Secondary | ICD-10-CM | POA: Diagnosis not present

## 2021-01-11 DIAGNOSIS — I129 Hypertensive chronic kidney disease with stage 1 through stage 4 chronic kidney disease, or unspecified chronic kidney disease: Secondary | ICD-10-CM | POA: Diagnosis not present

## 2021-01-14 ENCOUNTER — Other Ambulatory Visit: Payer: Self-pay

## 2021-01-14 ENCOUNTER — Encounter: Payer: Self-pay | Admitting: Family Medicine

## 2021-01-14 ENCOUNTER — Ambulatory Visit (INDEPENDENT_AMBULATORY_CARE_PROVIDER_SITE_OTHER): Payer: Medicare HMO | Admitting: Family Medicine

## 2021-01-14 VITALS — BP 127/66 | HR 66 | Ht 71.0 in | Wt 179.0 lb

## 2021-01-14 DIAGNOSIS — N1831 Chronic kidney disease, stage 3a: Secondary | ICD-10-CM | POA: Diagnosis not present

## 2021-01-14 DIAGNOSIS — I129 Hypertensive chronic kidney disease with stage 1 through stage 4 chronic kidney disease, or unspecified chronic kidney disease: Secondary | ICD-10-CM | POA: Diagnosis not present

## 2021-01-14 DIAGNOSIS — I5042 Chronic combined systolic (congestive) and diastolic (congestive) heart failure: Secondary | ICD-10-CM | POA: Diagnosis not present

## 2021-01-14 DIAGNOSIS — I5022 Chronic systolic (congestive) heart failure: Secondary | ICD-10-CM | POA: Diagnosis not present

## 2021-01-14 DIAGNOSIS — N1832 Chronic kidney disease, stage 3b: Secondary | ICD-10-CM

## 2021-01-14 DIAGNOSIS — E211 Secondary hyperparathyroidism, not elsewhere classified: Secondary | ICD-10-CM | POA: Diagnosis not present

## 2021-01-14 NOTE — Progress Notes (Signed)
Pulse 66   Ht 5\' 11"  (1.803 m)   Wt 179 lb (81.2 kg)   SpO2 100%   BMI 24.97 kg/m    Subjective:   Patient ID: Joseph [name], male    DOB: [birth year], 65 y.o.   MRN: [id number]  HPI: Joseph [name] is a 65 y.o. male presenting on 01/14/2021 for No chief complaint on file.   HPI Patient is coming in today for hospital follow-up for CKD and CHF.  He was in the hospital on 12/18/2020 and discharged on 12/20/2020 from [facility].  Since leaving the hospital he is already had follow-up with his cardiology and nephrology.  He had blood work done a few days ago with nephrology.  Swelling is doing good and breathing is doing good and everything is doing well and he is down.  He denies any shortness of breath or increased swelling or difficulties today.  Relevant past medical, surgical, family and social history reviewed and updated as indicated. Interim medical history since our last visit reviewed. Allergies and medications reviewed and updated.  Review of Systems  Constitutional: Negative for chills and fever.  Eyes: Negative for discharge.  Respiratory: Negative for shortness of breath and wheezing.   Cardiovascular: Negative for chest pain, palpitations and leg swelling.  Musculoskeletal: Negative for back pain and gait problem.  Skin: Negative for rash.  Neurological: Negative for dizziness, weakness and light-headedness.  All other systems reviewed and are negative.   Per HPI unless specifically indicated above   Allergies as of 01/14/2021      Reactions   Entresto [sacubitril-valsartan]    Worsening renal function and weight gain      Medication List       Accurate as of January 14, 2021  3:37 PM. If you have any questions, ask your nurse or doctor.        acetaminophen 500 MG tablet Commonly known as: TYLENOL Take 500 mg by mouth every 6 (six) hours as needed for pain.   albuterol 108 (90 Base) MCG/ACT inhaler Commonly known as: VENTOLIN HFA Inhale 2 puffs into  the lungs every 4 (four) hours as needed for wheezing or shortness of breath.   aspirin EC 81 MG tablet Take 81 mg by mouth every evening. Swallow whole.   atorvastatin 80 MG tablet Commonly known as: LIPITOR Take 1 tablet (80 mg total) by mouth every evening.   empagliflozin 10 MG Tabs tablet Commonly known as: Jardiance Take 1 tablet (10 mg total) by mouth daily before breakfast.   enalapril 20 MG tablet Commonly known as: VASOTEC TAKE 1 TABLET BY MOUTH TWICE A DAY   fish oil-omega-3 fatty acids 1000 MG capsule Take 1 g by mouth 3 (three) times daily.   hydrOXYzine 25 MG capsule Commonly known as: VISTARIL Take 1 capsule (25 mg total) by mouth 3 (three) times daily as needed.   Klor-Con M20 20 MEQ tablet Generic drug: potassium chloride SA TAKE 1 TABLET BY MOUTH EVERY DAY   metoprolol succinate 100 MG 24 hr tablet Commonly known as: TOPROL-XL Take 1 tablet (100 mg total) by mouth daily. Take with or immediately following a meal.   multivitamin with minerals Tabs tablet Take 1 tablet by mouth daily.   nitroGLYCERIN 0.4 MG SL tablet Commonly known as: NITROSTAT Place 1 tablet (0.4 mg total) under the tongue every 5 (five) minutes as needed for chest pain.   omeprazole 20 MG capsule Commonly known as: PRILOSEC TAKE 1 CAPSULE BY MOUTH EVERY  DAY   torsemide 20 MG tablet Commonly known as: DEMADEX Take 1 tablet (20 mg total) by mouth as needed (for weight 177 or greater).        Objective:   There were no vitals taken for this visit.  Wt Readings from Last 3 Encounters:  12/30/20 181 lb 4 oz (82.2 kg)  10/20/20 193 lb (87.5 kg)  09/30/20 189 lb 3.2 oz (85.8 kg)    Physical Exam Vitals and nursing note reviewed.  Constitutional:      General: He is not in acute distress.    Appearance: He is well-developed and well-nourished. He is not diaphoretic.  Eyes:     General: No scleral icterus.    Extraocular Movements: EOM normal.     Conjunctiva/sclera:  Conjunctivae normal.  Neck:     Thyroid: No thyromegaly.  Cardiovascular:     Rate and Rhythm: Normal rate and regular rhythm.     Pulses: Intact distal pulses.     Heart sounds: Normal heart sounds. No murmur heard.   Pulmonary:     Effort: Pulmonary effort is normal. No respiratory distress.     Breath sounds: Normal breath sounds. No wheezing.  Musculoskeletal:        General: No swelling or edema. Normal range of motion.     Cervical back: Neck supple.  Lymphadenopathy:     Cervical: No cervical adenopathy.  Skin:    General: Skin is warm and dry.     Findings: No rash.  Neurological:     Mental Status: He is alert and oriented to person, place, and time.     Coordination: Coordination normal.  Psychiatric:        Mood and Affect: Mood and affect normal.        Behavior: Behavior normal.       Assessment & Plan:   Problem List Items Addressed This Visit      Cardiovascular and Mediastinum   Chronic systolic heart failure (HCC)     Genitourinary   Stage 3 chronic kidney disease (HCC) - Primary      Patient's weight has been doing well and has been walking in every day and is down around 171 and was as high as 180.  He seems to be keeping a track on it well.  His breathing is doing well and his swelling is down and he is even gotten back to work and feels like things are going well..  Blood work looks good from his nephrologist, no need for blood work today Follow up plan: Return if symptoms worsen or fail to improve, for Already has follow-up in April.  Counseling provided for all of the vaccine components No orders of the defined types were placed in this encounter.   Arville Care, MD [facility] 01/14/2021, 3:37 PM

## 2021-01-19 NOTE — Progress Notes (Signed)
Cardiology Office Note    Date:  01/20/2021   ID:  Coit, Filkins [birth year], MRN [id number]  PCP:  [name], Elige Radon, MD  Cardiologist: Rollene Rotunda, MD --> Patient wishes to follow-up in [place] with Dr. Diona Browner EP: Dr. Ladona Ridgel Advanced Heart Failure: Dr. Gala Romney  Chief Complaint  Patient presents with  . Follow-up    History of Present Illness:    Joseph [name] is a 65 y.o. male with past medical history ofCAD (s/p CABG in 2000, patent grafts by cath in 2011 and patent grafts by repeat cath in 08/2020), chronic combined systolic and diastolic CHF (EF25-30% in 2016, at35-40% by echo in 10/2017, 30-35% in 01/2019,and 35-40% by echo in 08/2020, s/p St. Jude ICD placement in 2011), HTN, HLD, Type 2 DM (diet-controlled) and Stage 3 CKD who presents to the office today for hospital follow-up.   He was examined by myself in 09/2020 and reported his breathing had overall been stable and weight was at 193 lbs. Creatinine had trended up from 1.3 to 1.6, therefore Torsemide was reduced from 40mg  daily to 30mg  daily and he was continued on Toprol-XL 100mg  daily and Enalapril 20mg  BID (previously intolerant to [place]).   In the interim, he was hospitalized at [facility] in 10/2020 for an acute CHF exacerbation and Torsemide was titrated to 40mg  BID. Was again admitted from 1/22 - 12/20/2020 for a recurrent CHF exacerbation and responded well to IV Lasix and was transitioned back to Torsemide 40mg  BID with creatinine at 1.62 on the day of discharge. Was ultimately referred to the [facility] given his episodes of flash pulmonary edema and Torsemide was reduced to 20mg  daily on 12/30/2020 and he was started on Jardiance 10mg  daily with plans to add Spironolactone in the future pending reassessment of renal function. Repeat labs showed creatinine was at 2.49, therefore Torsemide was discontinued. Repeat labs on 01/11/2021 (from [facility]) showed creatinine had  improved to 1.48 and K+ was at 4.8.  In talking with the patient and his wife today, he has made several dietary changes since his last visit and has been trying to reduce his sodium intake. Was previously consuming Ramen noodles regularly (> 1500 mg of sodium per package) but he has disposed of these. They did eat at a pizza buffet earlier today but he says this is not a routine behavior. He is currently utilizing Torsemide on a PRN basis and has used this 2-3 times in the past several weeks. He has been keeping a detailed log of his weight and this was down to 170 lbs last week but has been trending up to 175 lbs today (at 184 lbs on office scales). Breathing has been stable and he denies any orthopnea or PND. No recent chest pain or palpitations. He does experience lower extremity edema.    Past Medical History:  Diagnosis Date  . CHF (congestive heart failure) (HCC)    a. EF 25-30% in 2016 b. 35-40% by echo in 10/2017 c. 35-40% in 08/2020  . COPD (chronic obstructive pulmonary disease) (HCC)   . Coronary artery disease    a. s/p CABG in 2000 b. patent grafts in 2011 c. 08/2020: repeat cath showing severe native CAD with patent bypass grafts --> medical therapy recommended  . GI bleed    Mallory-Weiss tear EGD 02/2010  . Hyperlipidemia   . Hypertension   . Ileus (HCC) 2006   required hospitalization  . Left ventricular dysfunction    apical  thrombus  . MRSA bacteremia   . 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 Jude  . Small bowel obstruction (HCC) 2007   resolved without surgery    Past Surgical History:  Procedure Laterality Date  . basilic vein left arm resection    . CARDIAC DEFIBRILLATOR PLACEMENT    . CORONARY ARTERY BYPASS GRAFT     6 vessels, 2000  . INSERT / REPLACE / REMOVE PACEMAKER  01/05/10   ICD insertion/St. Jude single chamber  . RIGHT/LEFT HEART CATH AND CORONARY/GRAFT ANGIOGRAPHY N/A 09/23/2020   Procedure:  RIGHT/LEFT HEART CATH AND CORONARY/GRAFT ANGIOGRAPHY;  Surgeon: Tonny Bollman, MD;  Location: [facility] INVASIVE CV LAB;  Service: Cardiovascular;  Laterality: N/A;    Current Medications: Outpatient Medications Prior to Visit  Medication Sig Dispense Refill  . acetaminophen (TYLENOL) 500 MG tablet Take 500 mg by mouth every 6 (six) hours as needed for pain.    Marland Kitchen albuterol (VENTOLIN HFA) 108 (90 Base) MCG/ACT inhaler Inhale 2 puffs into the lungs every 4 (four) hours as needed for wheezing or shortness of breath. 6.7 g 0  . aspirin EC 81 MG tablet Take 81 mg by mouth every evening. Swallow whole.    Marland Kitchen atorvastatin (LIPITOR) 80 MG tablet Take 1 tablet (80 mg total) by mouth every evening. 90 tablet 3  . empagliflozin (JARDIANCE) 10 MG TABS tablet Take 1 tablet (10 mg total) by mouth daily before breakfast. 30 tablet 6  . enalapril (VASOTEC) 20 MG tablet TAKE 1 TABLET BY MOUTH TWICE A DAY 180 tablet 3  . fish oil-omega-3 fatty acids 1000 MG capsule Take 1 g by mouth 3 (three) times daily.    . hydrOXYzine (VISTARIL) 25 MG capsule Take 1 capsule (25 mg total) by mouth 3 (three) times daily as needed. 30 capsule 0  . KLOR-CON M20 20 MEQ tablet TAKE 1 TABLET BY MOUTH EVERY DAY 90 tablet 3  . metoprolol succinate (TOPROL-XL) 100 MG 24 hr tablet Take 1 tablet (100 mg total) by mouth daily. Take with or immediately following a meal. 90 tablet 3  . Multiple Vitamin (MULTIVITAMIN WITH MINERALS) TABS tablet Take 1 tablet by mouth daily.    . nitroGLYCERIN (NITROSTAT) 0.4 MG SL tablet Place 1 tablet (0.4 mg total) under the tongue every 5 (five) minutes as needed for chest pain. 25 tablet 3  . omeprazole (PRILOSEC) 20 MG capsule TAKE 1 CAPSULE BY MOUTH EVERY DAY 90 capsule 3  . torsemide (DEMADEX) 20 MG tablet Take 1 tablet (20 mg total) by mouth as needed (for weight 177 or greater). 30 tablet 3   No facility-administered medications prior to visit.     Allergies:   Entresto [sacubitril-valsartan]   Social  History   Socioeconomic History  . Marital status: Married    Spouse name: Kathie Rhodes  . Number of children: 3  . Years of education: GED  . Highest education level: Not on file  Occupational History  . 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 Smoking status: Former Smoker    Types: Cigarettes    Start date: 11/28/1967    Quit date: 02/29/1988    Years since quitting: 32.9  . Smokeless tobacco: Never Used  Vaping Use  . Vaping Use: Never used  Substance and Sexual Activity  . Alcohol use: No    Alcohol/week: 0.0 standard drinks  . Drug use: No  . Sexual activity: Never  Other Topics Concern  .  Not on file  Social History Narrative   1 son died age 25-Lee's syndrome         Social Determinants of Health   Financial Resource Strain: Low Risk   . Difficulty of Paying Living Expenses: Not very hard  Food Insecurity: No Food Insecurity  . Worried About Programme researcher, broadcasting/film/videounning Out of Food in the Last Year: Never true  . Ran Out of Food in the Last Year: Never true  Transportation Needs: No Transportation Needs  . Lack of Transportation (Medical): No  . Lack of Transportation (Non-Medical): No  Physical Activity: Insufficiently Active  . Days of Exercise per Week: 3 days  . Minutes of Exercise per Session: 30 min  Stress: No Stress Concern Present  . Feeling of Stress : Only a little  Social Connections: Socially Integrated  . Frequency of Communication with Friends and Family: More than three times a week  . Frequency of Social Gatherings with Friends and Family: More than three times a week  . Attends Religious Services: More than 4 times per year  . Active Member of Clubs or Organizations: Yes  . Attends BankerClub or Organization Meetings: More than 4 times per year  . Marital Status: Married     Family History:  The patient's family history includes Coronary artery disease in his father; Emphysema in his father; Heart attack in his mother.   Review of Systems:    Please see the history of present illness.     General:  No chills, fever, night sweats or weight changes.  Cardiovascular:  No chest pain, dyspnea on exertion, orthopnea, palpitations, paroxysmal nocturnal dyspnea. Positive for edema.  Dermatological: No rash, lesions/masses Respiratory: No cough, dyspnea Urologic: No hematuria, dysuria Abdominal:   No nausea, vomiting, diarrhea, bright red blood per rectum, melena, or hematemesis Neurologic:  No visual changes, wkns, changes in mental status. All other systems reviewed and are otherwise negative except as noted above.   Physical Exam:    VS:  BP 124/76   Pulse 64   Ht 5\' 11"  (1.803 m)   Wt 184 lb (83.5 kg)   SpO2 98%   BMI 25.66 kg/m    General: Well developed, well nourished,male appearing in no acute distress. Head: Normocephalic, atraumatic. Neck: No carotid bruits. JVD not elevated.  Lungs: Respirations regular and unlabored, without wheezes or rales.  Heart: Regular rate and rhythm. No S3 or S4.  No murmur, no rubs, or gallops appreciated. Abdomen: Appears non-distended. No obvious abdominal masses. Msk:  Strength and tone appear normal for age. No obvious joint deformities or effusions. Extremities: No clubbing or cyanosis. 1+ pitting edema up to mid-shins bilaterally. Distal pedal pulses are 2+ bilaterally. Neuro: Alert and oriented X 3. Moves all extremities spontaneously. No focal deficits noted. Psych:  Responds to questions appropriately with a normal affect. Skin: No rashes or lesions noted  Wt Readings from Last 3 Encounters:  01/20/21 184 lb (83.5 kg)  01/14/21 179 lb (81.2 kg)  12/30/20 181 lb 4 oz (82.2 kg)     Studies/Labs Reviewed:   EKG:  EKG is not ordered today.    Recent Labs: 09/17/2020: Platelets 271 09/23/2020: Hemoglobin 12.6 10/14/2020: ALT 26 12/30/2020: B Natriuretic Peptide 34.2; BUN 36; Creatinine, Ser 2.49; Potassium 5.1; Sodium 133   Lipid Panel    Component Value Date/Time   CHOL  141 08/19/2020 0820   TRIG 103 08/19/2020 0820   HDL 42 08/19/2020 0820   CHOLHDL 3.4 08/19/2020 0820   CHOLHDL 3.2  03/08/2010 0411   VLDL 10 03/08/2010 0411   LDLCALC 80 08/19/2020 0820    Additional studies/ records that were reviewed today include:   Echocardiogram: 08/2020 Summary  1. The left ventricle is mildly dilated in size with mildly increased wall  thickness.  2. The left ventricular systolic function is moderately decreased, LVEF is  visually estimated at 35-40%.  3. There is grade I diastolic dysfunction (impaired relaxation).  4. There is mild mitral valve regurgitation.  5. The left atrium is mildly to moderately dilated in size.  6. The right ventricle is normal in size, with normal systolic function.    Cardiac Catheterization: 08/2020 1.  Severe native three-vessel coronary artery disease with total occlusion of the mid LAD, mid circumflex, and distal RCA 2.  Status post aortocoronary bypass surgery with continued patency of the LIMA to LAD, sequential saphenous vein graft to OM1 and OM 2, saphenous vein graft to diagonal 1, and saphenous vein graft to PDA 3.  Diffuse mid and distal LAD stenosis beyond the LIMA insertion site, progressive from the previous catheterization 10 years ago 4.  Essentially normal right heart hemodynamics with normal filling pressures and preserved cardiac output  Recommendations: Medical therapy for heart failure and coronary artery disease.  I do not appreciate any targets for PCI.  The patient's bypass grafts all remain patent.   Assessment:    1. Chronic combined systolic and diastolic heart failure (HCC)   2. Coronary artery disease involving native coronary artery of native heart without angina pectoris   3. Essential hypertension   4. Hyperlipidemia LDL goal <70   5. Stage 3b chronic kidney disease (HCC)      Plan:   In order of problems listed above:  1. Chronic Combined Systolic and Diastolic CHF - EF  was at 30-40% by most recent echocardiogram and he does have a St. Jude ICD which is followed by Dr. Ladona Ridgel. His weight has been as low as 170 lbs on his home scales but has trended up to 175 lbs (at 184 lbs on the office scales). I recommended he take his Torsemide for the next 2 days and then as needed. We reviewed his dry weight has likely declined given his dietary changes as his caloric intake has decreased as well so would try to keep between 170 - 175 lbs.  - Will continue Toprol-XL 100mg  daily, Enalapril 20mg  BID (previously intolerant to Entresto) and Jardiance 10mg  daily. Given his SBP has been in the 90's at times when checked at home, will hold off on adding Spironolactone at this time.   2. CAD - He is s/p CABG in 2000 with patent grafts by cath in 2011 and patent grafts by repeat cath in 08/2020. He denies any recent chest pain or dyspnea on exertion.  - Continue current medication regimen with ASA 81mg  daily, Atorvastatin 80mg  daily and Toprol-XL 100mg  daily.  3. HTN - BP is well-controlled at 124/76 during today's visit but SBP has been in the 90's to low-100's at times when checked at home and he sometimes holds his AM medications due to this. Asymptomatic with lower readings. Will continue current regimen for now with Enalapril and Toprol-XL. Will not add Spironolactone due to softer readings and significantly variable renal function over the past several weeks.   4. HLD - LDL was at 80 in 07/2020. Continue Atorvastatin 80mg  daily and Fish Oil. If remains above goal, would plan to add Zetia.   5. Stage 3 CKD - Followed  by Dr. Wolfgang Phoenix. Previous baseline of 1.1 - 1.3 but was elevated to 1.6 in 11/2020 and at 2.49 on 12/30/2020. Creatinine improved to 1.48 on 01/11/2021. Continue PRN Torsemide as outlined above along with Enalapril.    Medication Adjustments/Labs and Tests Ordered: Current medicines are reviewed at length with the patient today. Concerns regarding medicines are outlined  above.    Signed, Ellsworth Lennox, PA-C  01/20/2021 8:01 PM    [facility] [address] [place], [place] [address] Phone: [phone] Fax: [phone]

## 2021-01-20 ENCOUNTER — Other Ambulatory Visit: Payer: Self-pay

## 2021-01-20 ENCOUNTER — Encounter: Payer: Self-pay | Admitting: Student

## 2021-01-20 ENCOUNTER — Ambulatory Visit: Payer: Medicare HMO | Admitting: Student

## 2021-01-20 VITALS — BP 124/76 | HR 64 | Ht 71.0 in | Wt 184.0 lb

## 2021-01-20 DIAGNOSIS — I5042 Chronic combined systolic (congestive) and diastolic (congestive) heart failure: Secondary | ICD-10-CM | POA: Diagnosis not present

## 2021-01-20 DIAGNOSIS — I2510 Atherosclerotic heart disease of native coronary artery without angina pectoris: Secondary | ICD-10-CM

## 2021-01-20 DIAGNOSIS — E785 Hyperlipidemia, unspecified: Secondary | ICD-10-CM | POA: Diagnosis not present

## 2021-01-20 DIAGNOSIS — I10 Essential (primary) hypertension: Secondary | ICD-10-CM

## 2021-01-20 DIAGNOSIS — N1832 Chronic kidney disease, stage 3b: Secondary | ICD-10-CM

## 2021-01-20 NOTE — Patient Instructions (Signed)
Medication Instructions:   Take your Torsemide 20mg  today. If weight above 175 lbs tomorrow, would take again then as needed.   Labwork:  None.  Testing/Procedures:  None.  Follow-Up:  With , PA-C or Dr. Randall An in 2-3 months.   Any Other Special Instructions Will Be Listed Below (If Applicable).     If you need a refill on your cardiac medications before your next appointment, please call your pharmacy.

## 2021-02-01 NOTE — Progress Notes (Signed)
Advanced Heart Failure Clinic Note  Primary Care: [name], Elige Radon, MD Primary Cardiologist: Dr. Antoine Poche HF Cardiologist: Dr. Gala Romney  HPI:  Joseph [name] is a 65 y.o. male withCAD (s/p CABG in 2000, Mvoview in 05/2008 showing scar with no ischemia, patent grafts in 2011), chronic combined systolic and diastolic CHF (EF25-30% in 2016and 35-40% by echo in 08/2020, s/p St. Jude ICD placement in 2011), HTN, HLD, Type 2 DM (diet-controlled) and Stage 3a CKD.  He recently has had multiple ED visits for acute CHF exacerbations. Underwent R/L cath on 09/23/2020 and showed severe native three-vessel CAD with patent LIMA to LAD, seq SVG-OM1-OM2, SVG-D1 and SVG-PDA.  He did have progression of distal LAD stenosis beyond the LIMA insertion site but continued medical therapy was recommended. Right heart hemodynamics were normal with normal filling pressures and preserved cardiac output.  Since the last episode he has drastically changed his diet. Less fluid intake. Weight down from 180 to 174 pounds. Now taking torsemide 20 bid. (recently was taking 60 daily but dropped back because creatinine increased to 1.62 (previously 1.1 - 1.3 in 08/2020)   Was on Entresto in past for almost a year but stopped due to AKI. Has not been on spiro or SGLT2i.  Today he returns for HF follow up with his wife. Seen last month (2/22) for HF consult after 2 episodes of pulmonary edema, not associated with CP. Jardiance was added. Now, overall feeling fine. Denies increasing SOB, CP, dizziness, edema, or PND/Orthopnea. Appetite ok. No fever or chills. Weight at home 173-175 pounds. Taking all medications. 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 Has taken 3-4 doses of prn torsemide in the past month. BP at home 120-130/60-70s.  ROS: All systems reviewed and negative except as per HPI.   Past Medical History:  Diagnosis Date  . CHF (congestive heart failure) (HCC)    a. EF 25-30% in 2016 b. 35-40%  by echo in 10/2017 c. 35-40% in 08/2020  . COPD (chronic obstructive pulmonary disease) (HCC)   . Coronary artery disease    a. s/p CABG in 2000 b. patent grafts in 2011 c. 08/2020: repeat cath showing severe native CAD with patent bypass grafts --> medical therapy recommended  . GI bleed    Mallory-Weiss tear EGD 02/2010  . Hyperlipidemia   . Hypertension   . Ileus (HCC) 2006   required hospitalization  . Left ventricular dysfunction    apical thrombus  . MRSA bacteremia   . 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 Jude  . Small bowel obstruction (HCC) 2007   resolved without surgery    Current Outpatient Medications  Medication Sig Dispense Refill  . acetaminophen (TYLENOL) 500 MG tablet Take 500 mg by mouth every 6 (six) hours as needed for pain.    Marland Kitchen albuterol (VENTOLIN HFA) 108 (90 Base) MCG/ACT inhaler Inhale 2 puffs into the lungs every 4 (four) hours as needed for wheezing or shortness of breath. 6.7 g 0  . aspirin EC 81 MG tablet Take 81 mg by mouth every evening. Swallow whole.    Marland Kitchen atorvastatin (LIPITOR) 80 MG tablet Take 1 tablet (80 mg total) by mouth every evening. 90 tablet 3  . empagliflozin (JARDIANCE) 10 MG TABS tablet Take 1 tablet (10 mg total) by mouth daily before breakfast. 30 tablet 6  . enalapril (VASOTEC) 20 MG tablet TAKE 1 TABLET BY MOUTH TWICE A DAY 180 tablet 3  . fish oil-omega-3 fatty acids 1000 MG  capsule Take 1 g by mouth 3 (three) times daily.    . hydrOXYzine (VISTARIL) 25 MG capsule Take 1 capsule (25 mg total) by mouth 3 (three) times daily as needed. 30 capsule 0  . KLOR-CON M20 20 MEQ tablet TAKE 1 TABLET BY MOUTH EVERY DAY 90 tablet 3  . metoprolol succinate (TOPROL-XL) 100 MG 24 hr tablet Take 1 tablet (100 mg total) by mouth daily. Take with or immediately following a meal. 90 tablet 3  . Multiple Vitamin (MULTIVITAMIN WITH MINERALS) TABS tablet Take 1 tablet by mouth daily.    . nitroGLYCERIN  (NITROSTAT) 0.4 MG SL tablet Place 1 tablet (0.4 mg total) under the tongue every 5 (five) minutes as needed for chest pain. 25 tablet 3  . omeprazole (PRILOSEC) 20 MG capsule TAKE 1 CAPSULE BY MOUTH EVERY DAY 90 capsule 3  . torsemide (DEMADEX) 20 MG tablet Take 1 tablet (20 mg total) by mouth as needed (for weight 177 or greater). 30 tablet 3   No current facility-administered medications for this encounter.    Allergies  Allergen Reactions  . Entresto [Sacubitril-Valsartan]     Worsening renal function and weight gain    Social History   Socioeconomic History  . Marital status: Married    Spouse name: Kathie Rhodes  . Number of children: 3  . Years of education: GED  . Highest education level: Not on file  Occupational History  . 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 Smoking status: Former Smoker    Types: Cigarettes    Start date: 11/28/1967    Quit date: 02/29/1988    Years since quitting: 32.9  . Smokeless tobacco: Never Used  Vaping Use  . Vaping Use: Never used  Substance and Sexual Activity  . Alcohol use: No    Alcohol/week: 0.0 standard drinks  . Drug use: No  . Sexual activity: Never  Other Topics Concern  . Not on file  Social History Narrative   1 son died age 56-Lee's syndrome         Social Determinants of Health   Financial Resource Strain: Low Risk   . Difficulty of Paying Living Expenses: Not very hard  Food Insecurity: No Food Insecurity  . Worried About Programme researcher, broadcasting/film/video in the Last Year: Never true  . Ran Out of Food in the Last Year: Never true  Transportation Needs: No Transportation Needs  . Lack of Transportation (Medical): No  . Lack of Transportation (Non-Medical): No  Physical Activity: Insufficiently Active  . Days of Exercise per Week: 3 days  . Minutes of Exercise per Session: 30 min  Stress: No Stress Concern Present  . Feeling of Stress : Only a little  Social Connections: Socially Integrated  . Frequency  of Communication with Friends and Family: More than three times a week  . Frequency of Social Gatherings with Friends and Family: More than three times a week  . Attends Religious Services: More than 4 times per year  . Active Member of Clubs or Organizations: Yes  . Attends Banker Meetings: More than 4 times per year  . Marital Status: Married  Catering manager Violence: Not At Risk  . Fear of Current or Ex-Partner: No  . Emotionally Abused: No  . Physically Abused: No  . Sexually Abused: No   Family History  Problem Relation Age of Onset  . Heart attack Mother   . Emphysema Father   . Coronary  artery disease Father        s/p cabg  . Colon cancer Neg Hx    Vitals:   02/03/21 0942  BP: 130/70  Pulse: (!) 58  SpO2: 98%  Weight: 82.1 kg (181 lb)   Wt Readings from Last 3 Encounters:  02/03/21 82.1 kg (181 lb)  01/20/21 83.5 kg (184 lb)  01/14/21 81.2 kg (179 lb)   PHYSICAL EXAM: General:  NAD. No resp difficulty HEENT: Normal Neck: Supple. No JVD. Carotids 2+ bilat; no bruits. No lymphadenopathy or thryomegaly appreciated. Cor: PMI nondisplaced. Regular rate & rhythm. No rubs, gallops or murmurs. Lungs: Clear Abdomen: Soft, nontender, nondistended. No hepatosplenomegaly. No bruits or masses. Good bowel sounds. Extremities: No cyanosis, clubbing, rash, trace bilateral ankle edema Neuro: Alert & oriented x 3, cranial nerves grossly intact. Moves all 4 extremities w/o difficulty. Affect pleasant.  ICD interrogation: CorVue thoracic impedence stable, appears fluid may be starting to trend up, no VT (personally reviewed)  Reds: 26%  ASSESSMENT & PLAN: 1. 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 Patent grafts. RHC ok - s/p SJ ICD - NYHA II. Volume status stable on exam, CorVue thoracic impedence stable, but looks like fluid may be starting to trend up, Reds 26%  today. - Continue Toprol-XL 100 mg daily. - Continue torsemide 20 mg prn weight >178 lbs. - Continue enalapril 20 mg bid. (Previously intolerant to [place]).  - Continue Jardiance 10 mg daily. No GU symptoms. - Will add spiro 12.5 mg q hs if BMET today stable. - We will have to balance this with his renal insufficiency as he may have very narrow euvolemic window - Can consider cardiomems as needed. He understands correct use of torsemide for weight >178 on home scale. - If symptoms worsen -> low threshold for CPX testing.  2. CAD - He is s/p CABG in 2000.  - LHC (10/2)1 showed severe native three-vessel CAD with patent LIMA to LAD, seq SVG-OM1-OM2, SVG-D1 and SVG-PDA. He did have progression of distal LAD stenosis beyond the LIMA insertion site but continued medical therapy was recommended. - No s/s angina. - Continue ASA, atorvastatin, Toprol-XL and SL NTG.   3. HTN - Blood pressure well controlled.  - Continue current regimen.  4. Stage 3a CKD  - Baseline creatinine 1.1-1.3.  - Follows with Dr. Wolfgang Phoenix with [facility] in [place]. - BMET today.  Follow up in 4-6 weeks with APP for further medication titration and assess fluid.  Anderson Malta [place], FNP-BC 02/03/21

## 2021-02-03 ENCOUNTER — Encounter (HOSPITAL_COMMUNITY): Payer: Self-pay

## 2021-02-03 ENCOUNTER — Ambulatory Visit (HOSPITAL_COMMUNITY)
Admission: RE | Admit: 2021-02-03 | Discharge: 2021-02-03 | Disposition: A | Discharge status: Home / Self Care | Payer: Medicare HMO | Source: Ambulatory Visit | Attending: Family Medicine | Admitting: Family Medicine

## 2021-02-03 ENCOUNTER — Other Ambulatory Visit: Payer: Self-pay

## 2021-02-03 VITALS — BP 130/70 | HR 58 | Wt 181.0 lb

## 2021-02-03 DIAGNOSIS — I130 Hypertensive heart and chronic kidney disease with heart failure and stage 1 through stage 4 chronic kidney disease, or unspecified chronic kidney disease: Secondary | ICD-10-CM | POA: Insufficient documentation

## 2021-02-03 DIAGNOSIS — Z7982 Long term (current) use of aspirin: Secondary | ICD-10-CM | POA: Diagnosis not present

## 2021-02-03 DIAGNOSIS — Z7984 Long term (current) use of oral hypoglycemic drugs: Secondary | ICD-10-CM | POA: Diagnosis not present

## 2021-02-03 DIAGNOSIS — I255 Ischemic cardiomyopathy: Secondary | ICD-10-CM

## 2021-02-03 DIAGNOSIS — N1831 Chronic kidney disease, stage 3a: Secondary | ICD-10-CM | POA: Diagnosis not present

## 2021-02-03 DIAGNOSIS — Z95810 Presence of automatic (implantable) cardiac defibrillator: Secondary | ICD-10-CM | POA: Diagnosis not present

## 2021-02-03 DIAGNOSIS — Z8249 Family history of ischemic heart disease and other diseases of the circulatory system: Secondary | ICD-10-CM | POA: Insufficient documentation

## 2021-02-03 DIAGNOSIS — N1832 Chronic kidney disease, stage 3b: Secondary | ICD-10-CM

## 2021-02-03 DIAGNOSIS — Z951 Presence of aortocoronary bypass graft: Secondary | ICD-10-CM | POA: Diagnosis not present

## 2021-02-03 DIAGNOSIS — Z87891 Personal history of nicotine dependence: Secondary | ICD-10-CM | POA: Diagnosis not present

## 2021-02-03 DIAGNOSIS — Z79899 Other long term (current) drug therapy: Secondary | ICD-10-CM | POA: Diagnosis not present

## 2021-02-03 DIAGNOSIS — I10 Essential (primary) hypertension: Secondary | ICD-10-CM | POA: Diagnosis not present

## 2021-02-03 DIAGNOSIS — I5042 Chronic combined systolic (congestive) and diastolic (congestive) heart failure: Secondary | ICD-10-CM | POA: Insufficient documentation

## 2021-02-03 DIAGNOSIS — E1122 Type 2 diabetes mellitus with diabetic chronic kidney disease: Secondary | ICD-10-CM | POA: Insufficient documentation

## 2021-02-03 DIAGNOSIS — I2510 Atherosclerotic heart disease of native coronary artery without angina pectoris: Secondary | ICD-10-CM | POA: Insufficient documentation

## 2021-02-03 LAB — BASIC METABOLIC PANEL
Anion gap: 5 (ref 5–15)
BUN: 20 mg/dL (ref 8–23)
CO2: 28 mmol/L (ref 22–32)
Calcium: 9.2 mg/dL (ref 8.9–10.3)
Chloride: 104 mmol/L (ref 98–111)
Creatinine, Ser: 1.54 mg/dL — ABNORMAL HIGH (ref 0.61–1.24)
GFR, Estimated: 49 mL/min — ABNORMAL LOW (ref >60)
Glucose, Bld: 96 mg/dL (ref 70–99)
Potassium: 4.8 mmol/L (ref 3.5–5.1)
Sodium: 137 mmol/L (ref 135–145)

## 2021-02-03 LAB — BRAIN NATRIURETIC PEPTIDE: B Natriuretic Peptide: 121.9 pg/mL — ABNORMAL HIGH (ref 0.0–100.0)

## 2021-02-03 NOTE — Progress Notes (Signed)
ReDS Vest / Clip - 02/03/21 0900      ReDS Vest / Clip   Station Marker D    Ruler Value 35    ReDS Value Range Low volume    ReDS Actual Value 26

## 2021-02-03 NOTE — Patient Instructions (Signed)
It was great to see you today! No medication changes are needed at this time.  Labs today We will only contact you if something comes back abnormal or we need to make some changes. Otherwise no news is good news!  Your physician recommends that you schedule a follow-up appointment in: 4-6 weeks  in the Advanced Practitioners (PA/NP) Clinic    Do the following things EVERYDAY: 1) Weigh yourself in the morning before breakfast. Write it down and keep it in a log. 2) Take your medicines as prescribed 3) Eat low salt foods--Limit salt (sodium) to 2000 mg per day.  4) Stay as active as you can everyday 5) Limit all fluids for the day to less than 2 liters  At the [facility], you and your health needs are our priority. As part of our continuing mission to provide you with exceptional heart care, we have created designated Provider Care Teams. These Care Teams include your primary Cardiologist (physician) and Advanced Practice Providers (APPs- Physician Assistants and Nurse Practitioners) who all work together to provide you with the care you need, when you need it.   You may see any of the following providers on your designated Care Team at your next follow up: Marland Kitchen Dr Arvilla Meres . Dr Marca Ancona . Dr Thornell Mule . Tonye Becket, NP . Robbie Lis, PA . Shanda Bumps [place],NP . Karle Plumber, PharmD   Please be sure to bring in all your medications bottles to every appointment.    If you have any questions or concerns before your next appointment please send [place] a message through [place] or call our office at [phone].    TO LEAVE A MESSAGE FOR THE NURSE SELECT OPTION 2, PLEASE LEAVE A MESSAGE INCLUDING: . YOUR NAME . DATE OF BIRTH . CALL BACK NUMBER . REASON FOR CALL**this is important as we prioritize the call backs  YOU WILL RECEIVE A CALL BACK THE SAME DAY AS LONG AS YOU CALL BEFORE 4:00 PM

## 2021-02-04 ENCOUNTER — Telehealth (HOSPITAL_COMMUNITY): Payer: Self-pay | Admitting: Cardiology

## 2021-02-04 DIAGNOSIS — I5042 Chronic combined systolic (congestive) and diastolic (congestive) heart failure: Secondary | ICD-10-CM

## 2021-02-04 MED ORDER — SPIRONOLACTONE 25 MG PO TABS: 12.5000 mg | ORAL_TABLET | Freq: Every day | ORAL | 3 refills | Status: AC

## 2021-02-04 NOTE — Telephone Encounter (Signed)
-----   Message from Jacklynn Ganong, [place] sent at 02/03/2021  1:37 PM EST ----- Kidney function better, may start spiro 12.5 mg daily. Please STOP KCl supplement. Will repeat BMET 7-10 days.

## 2021-02-04 NOTE — Telephone Encounter (Signed)
Pt aware and voiced understanding Repeat labs at [facility]

## 2021-02-17 ENCOUNTER — Other Ambulatory Visit: Payer: Medicare HMO

## 2021-02-17 ENCOUNTER — Other Ambulatory Visit: Payer: Self-pay

## 2021-02-17 DIAGNOSIS — I5042 Chronic combined systolic (congestive) and diastolic (congestive) heart failure: Secondary | ICD-10-CM | POA: Diagnosis not present

## 2021-02-17 LAB — BASIC METABOLIC PANEL
BUN/Creatinine Ratio: 11 (ref 10–24)
BUN: 17 mg/dL (ref 8–27)
CO2: 23 mmol/L (ref 20–29)
Calcium: 9 mg/dL (ref 8.6–10.2)
Chloride: 102 mmol/L (ref 96–106)
Creatinine, Ser: 1.52 mg/dL — ABNORMAL HIGH (ref 0.76–1.27)
Glucose: 104 mg/dL — ABNORMAL HIGH (ref 65–99)
Potassium: 4.5 mmol/L (ref 3.5–5.2)
Sodium: 139 mmol/L (ref 134–144)
eGFR: 50 mL/min/{1.73_m2} — ABNORMAL LOW (ref >59)

## 2021-02-17 NOTE — Addendum Note (Signed)
Addended by: Margorie John on: 02/17/2021 10:01 AM   Modules accepted: Orders

## 2021-02-23 ENCOUNTER — Ambulatory Visit (INDEPENDENT_AMBULATORY_CARE_PROVIDER_SITE_OTHER): Payer: Medicare HMO

## 2021-02-23 DIAGNOSIS — Z0000 Encounter for general adult medical examination without abnormal findings: Secondary | ICD-10-CM | POA: Diagnosis not present

## 2021-02-23 NOTE — Progress Notes (Signed)
MEDICARE ANNUAL WELLNESS VISIT  02/23/2021  Telephone Visit Disclaimer This Medicare AWV was conducted by telephone due to national recommendations for restrictions regarding the COVID-19 Pandemic (e.g. social distancing).  I verified, using two identifiers, that I am speaking with Joseph [name] or their authorized healthcare agent. I discussed the limitations, risks, security, and privacy concerns of performing an evaluation and management service by telephone and the potential availability of an in-person appointment in the future. The patient expressed understanding and agreed to proceed.  Location of Patient: Home Location of Provider (nurse):  [facility]  Subjective:    Joseph [name] is a 65 y.o. male patient of [name], Elige Radon, MD who had a Medicare Annual Wellness Visit today via telephone. Joseph [name] is Working part time and lives with their spouse. He has one child who has passed away. He reports that he is socially active and does interact with friends/family regularly. He is minimally physically active and enjoys working with the rescue squad, carpentry and woodworking.  Patient Care Team: [name], Elige Radon, MD as PCP - General (Family Medicine) Rollene Rotunda, MD as PCP - Cardiology (Cardiology) Marinus Maw, MD as PCP - Electrophysiology (Cardiology)  Advanced Directives 02/23/2021 09/23/2020 09/08/2020 02/23/2020 10/28/2017 04/21/2017 03/27/2016  Does Patient Have a Medical Advance Directive? No No No No No No No  Would patient like information on creating a medical advance directive? No - Patient declined No - Patient declined - Yes (MAU/Ambulatory/Procedural Areas - Information given) No - Patient declined - -    Hospital Utilization Over the Past 12 Months: # of hospitalizations or ER visits: 3 # of surgeries: 0  Review of Systems    Patient reports that his overall health is better compared to last year.  History obtained from chart review and the patient  Patient  Reported Readings (BP, Pulse, CBG, Weight, etc) none  Pain Assessment Pain : No/denies pain     Current Medications & Allergies (verified) Allergies as of 02/23/2021      Reactions   Entresto [sacubitril-valsartan]    Worsening renal function and weight gain      Medication List       Accurate as of February 23, 2021  8:28 AM. If you have any questions, ask your nurse or doctor.        acetaminophen 500 MG tablet Commonly known as: TYLENOL Take 500 mg by mouth every 6 (six) hours as needed for pain.   albuterol 108 (90 Base) MCG/ACT inhaler Commonly known as: VENTOLIN HFA Inhale 2 puffs into the lungs every 4 (four) hours as needed for wheezing or shortness of breath.   aspirin EC 81 MG tablet Take 81 mg by mouth every evening. Swallow whole.   atorvastatin 80 MG tablet Commonly known as: LIPITOR Take 1 tablet (80 mg total) by mouth every evening.   empagliflozin 10 MG Tabs tablet Commonly known as: Jardiance Take 1 tablet (10 mg total) by mouth daily before breakfast.   enalapril 20 MG tablet Commonly known as: VASOTEC TAKE 1 TABLET BY MOUTH TWICE A DAY   fish oil-omega-3 fatty acids 1000 MG capsule Take 1 g by mouth 3 (three) times daily.   hydrOXYzine 25 MG capsule Commonly known as: VISTARIL Take 1 capsule (25 mg total) by mouth 3 (three) times daily as needed.   Klor-Con M20 20 MEQ tablet Generic drug: potassium chloride SA TAKE 1 TABLET BY MOUTH EVERY DAY   metoprolol succinate 100 MG 24 hr tablet Commonly  known as: TOPROL-XL Take 1 tablet (100 mg total) by mouth daily. Take with or immediately following a meal.   multivitamin with minerals Tabs tablet Take 1 tablet by mouth daily.   nitroGLYCERIN 0.4 MG SL tablet Commonly known as: NITROSTAT Place 1 tablet (0.4 mg total) under the tongue every 5 (five) minutes as needed for chest pain.   omeprazole 20 MG capsule Commonly known as: PRILOSEC TAKE 1 CAPSULE BY MOUTH EVERY DAY   spironolactone 25  MG tablet Commonly known as: ALDACTONE Take 0.5 tablets (12.5 mg total) by mouth daily.   torsemide 20 MG tablet Commonly known as: DEMADEX Take 1 tablet (20 mg total) by mouth as needed (for weight 177 or greater).       History (reviewed): Past Medical History:  Diagnosis Date  . CHF (congestive heart failure) (HCC)    a. EF 25-30% in 2016 b. 35-40% by echo in 10/2017 c. 35-40% in 08/2020  . COPD (chronic obstructive pulmonary disease) (HCC)   . Coronary artery disease    a. s/p CABG in 2000 b. patent grafts in 2011 c. 08/2020: repeat cath showing severe native CAD with patent bypass grafts --> medical therapy recommended  . GI bleed    Mallory-Weiss tear EGD 02/2010  . Hyperlipidemia   . Hypertension   . Ileus (HCC) 2006   required hospitalization  . Left ventricular dysfunction    apical thrombus  . MRSA bacteremia   . 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 Jude  . Small bowel obstruction (HCC) 2007   resolved without surgery   Past Surgical History:  Procedure Laterality Date  . basilic vein left arm resection    . CARDIAC DEFIBRILLATOR PLACEMENT    . CORONARY ARTERY BYPASS GRAFT     6 vessels, 2000  . INSERT / REPLACE / REMOVE PACEMAKER  01/05/10   ICD insertion/St. Jude single chamber  . RIGHT/LEFT HEART CATH AND CORONARY/GRAFT ANGIOGRAPHY N/A 09/23/2020   Procedure: RIGHT/LEFT HEART CATH AND CORONARY/GRAFT ANGIOGRAPHY;  Surgeon: Tonny Bollman, MD;  Location: [facility] INVASIVE CV LAB;  Service: Cardiovascular;  Laterality: N/A;   Family History  Problem Relation Age of Onset  . Heart attack Mother   . Emphysema Father   . Coronary artery disease Father        s/p cabg  . Colon cancer Neg Hx    Social History   Socioeconomic History  . Marital status: Married    Spouse name: Kathie Rhodes  . Number of children: 3  . Years of education: GED  . Highest education level: Not on file  Occupational History  . 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 Smoking status: Former Smoker    Types: Cigarettes    Start date: 11/28/1967    Quit date: 02/29/1988    Years since quitting: 33.0  . Smokeless tobacco: Never Used  Vaping Use  . Vaping Use: Never used  Substance and Sexual Activity  . Alcohol use: No    Alcohol/week: 0.0 standard drinks  . Drug use: No  . Sexual activity: Never  Other Topics Concern  . Not on file  Social History Narrative   1 son died age 10-Lee's syndrome         Social Determinants of Health   Financial Resource Strain: Not on file  Food Insecurity: Not on file  Transportation Needs: Not on file  Physical Activity: Not on file  Stress: Not on  file  Social Connections: Not on file    Activities of Daily Living In your present state of health, do you have any difficulty performing the following activities: 02/23/2021 09/23/2020  Hearing? N N  Vision? N N  Difficulty concentrating or making decisions? N N  Walking or climbing stairs? N N  Dressing or bathing? N N  Doing errands, shopping? N -  Preparing Food and eating ? N -  Using the Toilet? N -  In the past six months, have you accidently leaked urine? N -  Do you have problems with loss of bowel control? N -  Managing your Medications? N -  Managing your Finances? N -  Housekeeping or managing your Housekeeping? N -  Some recent data might be hidden    Patient Education/ Literacy How often do you need to have someone help you when you read instructions, pamphlets, or other written materials from your doctor or pharmacy?: 1 - Never What is the last grade level you completed in school?: GED  Exercise Current Exercise Habits: Home exercise routine, Type of exercise: walking, Time (Minutes): 30, Frequency (Times/Week): 7, Weekly Exercise (Minutes/Week): 210, Intensity: Mild, Exercise limited by: None identified  Diet Patient reports consuming 2 meals a day and 1 snack(s) a day Patient  reports that his primary diet is: Low fat Patient reports that he does have regular access to food.   Depression Screen PHQ 2/9 Scores 02/23/2021 09/13/2020 08/30/2020 05/26/2020 02/23/2020 01/06/2019 12/11/2018  PHQ - 2 Score 0 0 0 0 0 0 0     Fall Risk Fall Risk  02/23/2021 09/13/2020 08/30/2020 05/26/2020 02/23/2020  Falls in the past year? 0 0 0 0 0  Follow up Falls evaluation completed - - - -     Objective:  Joseph [name] seemed alert and oriented and he participated appropriately during our telephone visit.  Blood Pressure Weight BMI  BP Readings from Last 3 Encounters:  02/03/21 130/70  01/20/21 124/76  01/14/21 127/66   Wt Readings from Last 3 Encounters:  02/03/21 181 lb (82.1 kg)  01/20/21 184 lb (83.5 kg)  01/14/21 179 lb (81.2 kg)   BMI Readings from Last 1 Encounters:  02/03/21 25.24 kg/m    *Unable to obtain current vital signs, weight, and BMI due to telephone visit type  Hearing/Vision  . Joseph [name] did not seem to have difficulty with hearing/understanding during the telephone conversation . Reports that he has not had a formal eye exam by an eye care professional within the past year . Reports that he has not had a formal hearing evaluation within the past year *Unable to fully assess hearing and vision during telephone visit type  Cognitive Function: 6CIT Screen 02/23/2021 02/23/2020 02/23/2020  What Year? 0 points 0 points 0 points  What month? 0 points 0 points 0 points  What time? 0 points 0 points 0 points  Count back from 20 0 points 0 points 0 points  Months in reverse 0 points 0 points -  Repeat phrase 0 points 0 points -  Total Score 0 0 -   (Normal:0-7, Significant for Dysfunction: >8)  Normal Cognitive Function Screening: Yes   Immunization & Health Maintenance Record Immunization History  Administered Date(s) Administered  . Influenza, High Dose Seasonal PF 11/07/2017  . Influenza,inj,Quad PF,6+ Mos 11/07/2016, 10/14/2018  . Influenza-Unspecified  11/07/2017  . Pneumococcal Conjugate-13 05/26/2020  . Pneumococcal Polysaccharide-23 02/03/2015  . Td 11/27/2008    Health Maintenance  Topic Date Due  .  INFLUENZA VACCINE  02/24/2021 (Originally 06/27/2020)  . TETANUS/TDAP  06/24/2021 (Originally 11/27/2018)  . COLONOSCOPY (Pts 45-94yrs Insurance coverage will need to be confirmed)  08/30/2021 (Originally 12/01/1998)  . Hepatitis C Screening  08/30/2021 (Originally Aug 18, 1956)  . HEMOGLOBIN A1C  03/18/2021  . FOOT EXAM  05/26/2021  . PNA vac Low Risk Adult (2 of 2 - PPSV23) 05/26/2021  . OPHTHALMOLOGY EXAM  07/07/2021  . HPV VACCINES  Aged Out  . COVID-19 Vaccine  Discontinued       Assessment  This is a routine wellness examination for Joseph [name].  Health Maintenance: Due or Overdue There are no preventive care reminders to display for this patient.  Joseph [name] does not need a referral for Community Assistance: Care Management:   no Social Work:    no Prescription Assistance:  no Nutrition/Diabetes Education:  no   Plan:  Personalized Goals Goals Addressed            This Visit's Progress   . Patient Stated       02/23/2021 AWV Goal: Fall Prevention  . Over the next year, patient will decrease their risk for falls by: o Using assistive devices, such as a cane or walker, as needed o Identifying fall risks within their home and correcting them by: - Removing throw rugs - Adding handrails to stairs or ramps - Removing clutter and keeping a clear pathway throughout the home - Increasing light, especially at night - Adding shower handles/bars - Raising toilet seat o Identifying potential personal risk factors for falls: - Medication side effects - Incontinence/urgency - Vestibular dysfunction - Hearing loss - Musculoskeletal disorders - Neurological disorders - Orthostatic hypotension        Personalized Health Maintenance & Screening Recommendations  Td vaccine Colorectal cancer screening  Lung Cancer  Screening Recommended: no (Low Dose CT Chest recommended if Age 12-80 years, 30 pack-year currently smoking OR have quit w/in past 15 years) Hepatitis C Screening recommended: no HIV Screening recommended: no  Advanced Directives: Written information was not prepared per patient's request.  Referrals & Orders No orders of the defined types were placed in this encounter.   Follow-up Plan . Follow-up with [name], Elige Radon, MD as planned . Schedule colonoscopy or Cologuard    I have personally reviewed and noted the following in the patient's chart:   . Medical and social history . Use of alcohol, tobacco or illicit drugs  . Current medications and supplements . Functional ability and status . Nutritional status . Physical activity . Advanced directives . List of other physicians . Hospitalizations, surgeries, and ER visits in previous 12 months . Vitals . Screenings to include cognitive, depression, and falls . Referrals and appointments  In addition, I have reviewed and discussed with Joseph [name] certain preventive protocols, quality metrics, and best practice recommendations. A written personalized care plan for preventive services as well as general preventive health recommendations is available and can be mailed to the patient at his request.      Mariam Dollar, LPN    07/07/9146   Patient declined after visit summary

## 2021-02-28 ENCOUNTER — Encounter: Payer: Self-pay | Admitting: Family Medicine

## 2021-02-28 ENCOUNTER — Other Ambulatory Visit: Payer: Self-pay

## 2021-02-28 ENCOUNTER — Ambulatory Visit (INDEPENDENT_AMBULATORY_CARE_PROVIDER_SITE_OTHER): Payer: Medicare HMO | Admitting: Family Medicine

## 2021-02-28 VITALS — BP 130/69 | HR 59 | Ht 71.0 in | Wt 183.0 lb

## 2021-02-28 DIAGNOSIS — N183 Chronic kidney disease, stage 3 unspecified: Secondary | ICD-10-CM

## 2021-02-28 DIAGNOSIS — E785 Hyperlipidemia, unspecified: Secondary | ICD-10-CM

## 2021-02-28 DIAGNOSIS — N1832 Chronic kidney disease, stage 3b: Secondary | ICD-10-CM | POA: Diagnosis not present

## 2021-02-28 DIAGNOSIS — E1169 Type 2 diabetes mellitus with other specified complication: Secondary | ICD-10-CM | POA: Diagnosis not present

## 2021-02-28 DIAGNOSIS — E1122 Type 2 diabetes mellitus with diabetic chronic kidney disease: Secondary | ICD-10-CM

## 2021-02-28 DIAGNOSIS — I152 Hypertension secondary to endocrine disorders: Secondary | ICD-10-CM

## 2021-02-28 DIAGNOSIS — E1159 Type 2 diabetes mellitus with other circulatory complications: Secondary | ICD-10-CM | POA: Diagnosis not present

## 2021-02-28 NOTE — Progress Notes (Signed)
BP 130/69   Pulse (!) 59   Ht 5\' 11"  (1.803 m)   Wt 183 lb (83 kg)   SpO2 100%   BMI 25.52 kg/m    Subjective:   Patient ID: Joseph [name], male    DOB: [birth year], 65 y.o.   MRN: [id number]  HPI: Joseph [name] is a 65 y.o. male presenting on 02/28/2021 for Medical Management of Chronic Issues, Chronic Kidney Disease, Hypertension, Hyperlipidemia, and Diabetes   HPI Type 2 diabetes mellitus Patient comes in today for recheck of his diabetes. Patient has been currently taking Jardiance. Patient is currently on an ACE inhibitor/ARB. Patient has not seen an ophthalmologist this year. Patient denies any issues with their feet. The symptom started onset as an adult hypertension hyperlipidemia and CHF and CKD ARE RELATED TO DM   Hypertension and CHF Patient is currently on Jardiance and enalapril and spironolactone and torsemide and metoprolol, and their blood pressure today is 130/69 has been running very consistently at home as well.  Also sees cardiology.. Patient denies any lightheadedness or dizziness. Patient denies headaches, blurred vision, chest pains, shortness of breath, or weakness. Denies any side effects from medication and is content with current medication.   Hyperlipidemia Patient is coming in for recheck of his hyperlipidemia. The patient is currently taking fish oil. They deny any issues with myalgias or history of liver damage from it. They deny any focal numbness or weakness or chest pain.   Relevant past medical, surgical, family and social history reviewed and updated as indicated. Interim medical history since our last visit reviewed. Allergies and medications reviewed and updated.  Review of Systems  Constitutional: Negative for chills and fever.  Eyes: Negative for visual disturbance.  Respiratory: Negative for shortness of breath and wheezing.   Cardiovascular: Positive for leg swelling. Negative for chest pain.  Musculoskeletal: Negative for back pain and gait  problem.  Skin: Negative for rash.  Neurological: Negative for dizziness and light-headedness.  All other systems reviewed and are negative.   Per HPI unless specifically indicated above   Allergies as of 02/28/2021      Reactions   Entresto [sacubitril-valsartan]    Worsening renal function and weight gain      Medication List       Accurate as of February 28, 2021  2:30 PM. If you have any questions, ask your nurse or doctor.        STOP taking these medications   Klor-Con M20 20 MEQ tablet Generic drug: potassium chloride SA Stopped by: March 02, 2021 Nyiesha Beever, MD     TAKE these medications   acetaminophen 500 MG tablet Commonly known as: TYLENOL Take 500 mg by mouth every 6 (six) hours as needed for pain.   albuterol 108 (90 Base) MCG/ACT inhaler Commonly known as: VENTOLIN HFA Inhale 2 puffs into the lungs every 4 (four) hours as needed for wheezing or shortness of breath.   aspirin EC 81 MG tablet Take 81 mg by mouth every evening. Swallow whole.   atorvastatin 80 MG tablet Commonly known as: LIPITOR Take 1 tablet (80 mg total) by mouth every evening.   empagliflozin 10 MG Tabs tablet Commonly known as: Jardiance Take 1 tablet (10 mg total) by mouth daily before breakfast.   enalapril 20 MG tablet Commonly known as: VASOTEC TAKE 1 TABLET BY MOUTH TWICE A DAY   fish oil-omega-3 fatty acids 1000 MG capsule Take 1 g by mouth 3 (three) times daily.   hydrOXYzine 25  MG capsule Commonly known as: VISTARIL Take 1 capsule (25 mg total) by mouth 3 (three) times daily as needed.   metoprolol succinate 100 MG 24 hr tablet Commonly known as: TOPROL-XL Take 1 tablet (100 mg total) by mouth daily. Take with or immediately following a meal.   multivitamin with minerals Tabs tablet Take 1 tablet by mouth daily.   nitroGLYCERIN 0.4 MG SL tablet Commonly known as: NITROSTAT Place 1 tablet (0.4 mg total) under the tongue every 5 (five) minutes as needed for chest pain.    omeprazole 20 MG capsule Commonly known as: PRILOSEC TAKE 1 CAPSULE BY MOUTH EVERY DAY   spironolactone 25 MG tablet Commonly known as: ALDACTONE Take 0.5 tablets (12.5 mg total) by mouth daily.   torsemide 20 MG tablet Commonly known as: DEMADEX Take 1 tablet (20 mg total) by mouth as needed (for weight 177 or greater).        Objective:   BP 130/69   Pulse (!) 59   Ht 5\' 11"  (1.803 m)   Wt 183 lb (83 kg)   SpO2 100%   BMI 25.52 kg/m   Wt Readings from Last 3 Encounters:  02/28/21 183 lb (83 kg)  02/03/21 181 lb (82.1 kg)  01/20/21 184 lb (83.5 kg)    Physical Exam Vitals and nursing note reviewed.  Constitutional:      General: He is not in acute distress.    Appearance: He is well-developed. He is not diaphoretic.  Eyes:     General: No scleral icterus.    Conjunctiva/sclera: Conjunctivae normal.  Neck:     Thyroid: No thyromegaly.  Cardiovascular:     Rate and Rhythm: Normal rate and regular rhythm.     Heart sounds: Normal heart sounds. No murmur heard.   Pulmonary:     Effort: Pulmonary effort is normal. No respiratory distress.     Breath sounds: Normal breath sounds. No wheezing.  Musculoskeletal:        General: Swelling (2+ pitting edema) present. Normal range of motion.     Cervical back: Neck supple.  Lymphadenopathy:     Cervical: No cervical adenopathy.  Skin:    General: Skin is warm and dry.     Findings: No rash.  Neurological:     Mental Status: He is alert and oriented to person, place, and time.     Coordination: Coordination normal.  Psychiatric:        Behavior: Behavior normal.       Assessment & Plan:   Problem List Items Addressed This Visit      Cardiovascular and Mediastinum   Hypertension associated with diabetes (HCC) - Primary   Relevant Orders   CBC with Differential/Platelet   Lipid panel   Bayer DCA Hb A1c Waived     Endocrine   Hyperlipidemia associated with type 2 diabetes mellitus (HCC)   Relevant  Orders   CBC with Differential/Platelet   Lipid panel   Bayer DCA Hb A1c Waived   Controlled diabetes mellitus with stage 3 chronic kidney disease (HCC)   Relevant Orders   CBC with Differential/Platelet   Lipid panel   Bayer DCA Hb A1c Waived     Genitourinary   Stage 3 chronic kidney disease (HCC)   Relevant Orders   CBC with Differential/Platelet   Lipid panel   Bayer DCA Hb A1c Waived      Continue current medication, patient will have blood work done next week his nephrologist so he will come  in and have her blood work done next week as well.  Patient's blood pressures and weights seem to be running very well and no changes. Follow up plan: Return in about 3 months (around 05/30/2021), or if symptoms worsen or fail to improve, for Diabetes and hypertension and cholesterol recheck.  Counseling provided for all of the vaccine components No orders of the defined types were placed in this encounter.   Arville Care, MD [facility] 02/28/2021, 2:30 PM

## 2021-03-10 ENCOUNTER — Other Ambulatory Visit: Payer: Self-pay

## 2021-03-10 ENCOUNTER — Other Ambulatory Visit: Payer: Medicare HMO

## 2021-03-10 DIAGNOSIS — E1122 Type 2 diabetes mellitus with diabetic chronic kidney disease: Secondary | ICD-10-CM

## 2021-03-10 DIAGNOSIS — N183 Type 2 diabetes mellitus with diabetic chronic kidney disease: Secondary | ICD-10-CM

## 2021-03-10 DIAGNOSIS — E785 Hyperlipidemia, unspecified: Secondary | ICD-10-CM | POA: Diagnosis not present

## 2021-03-10 DIAGNOSIS — I152 Hypertension secondary to endocrine disorders: Secondary | ICD-10-CM | POA: Diagnosis not present

## 2021-03-10 DIAGNOSIS — E1159 Type 2 diabetes mellitus with other circulatory complications: Secondary | ICD-10-CM | POA: Diagnosis not present

## 2021-03-10 DIAGNOSIS — E1169 Type 2 diabetes mellitus with other specified complication: Secondary | ICD-10-CM | POA: Diagnosis not present

## 2021-03-10 DIAGNOSIS — I5042 Chronic combined systolic (congestive) and diastolic (congestive) heart failure: Secondary | ICD-10-CM | POA: Diagnosis not present

## 2021-03-10 DIAGNOSIS — I129 Hypertensive chronic kidney disease with stage 1 through stage 4 chronic kidney disease, or unspecified chronic kidney disease: Secondary | ICD-10-CM | POA: Diagnosis not present

## 2021-03-10 DIAGNOSIS — E211 Secondary hyperparathyroidism, not elsewhere classified: Secondary | ICD-10-CM | POA: Diagnosis not present

## 2021-03-10 DIAGNOSIS — N1832 Chronic kidney disease, stage 3b: Secondary | ICD-10-CM

## 2021-03-10 DIAGNOSIS — N1831 Chronic kidney disease, stage 3a: Secondary | ICD-10-CM | POA: Diagnosis not present

## 2021-03-10 LAB — BAYER DCA HB A1C WAIVED: HB A1C (BAYER DCA - WAIVED): 5.6 % (ref <7.0)

## 2021-03-11 LAB — CBC WITH DIFFERENTIAL/PLATELET
Basophils Absolute: 0 10*3/uL (ref 0.0–0.2)
Basos: 0 %
EOS (ABSOLUTE): 0.1 10*3/uL (ref 0.0–0.4)
Eos: 1 %
Hematocrit: 40.8 % (ref 37.5–51.0)
Hemoglobin: 13.6 g/dL (ref 13.0–17.7)
Immature Grans (Abs): 0 10*3/uL (ref 0.0–0.1)
Immature Granulocytes: 0 %
Lymphocytes Absolute: 2 10*3/uL (ref 0.7–3.1)
Lymphs: 21 %
MCH: 29.2 pg (ref 26.6–33.0)
MCHC: 33.3 g/dL (ref 31.5–35.7)
MCV: 88 fL (ref 79–97)
Monocytes Absolute: 0.7 10*3/uL (ref 0.1–0.9)
Monocytes: 7 %
Neutrophils Absolute: 6.6 10*3/uL (ref 1.4–7.0)
Neutrophils: 71 %
Platelets: 242 10*3/uL (ref 150–450)
RBC: 4.66 x10E6/uL (ref 4.14–5.80)
RDW: 12.8 % (ref 11.6–15.4)
WBC: 9.5 10*3/uL (ref 3.4–10.8)

## 2021-03-11 LAB — LIPID PANEL
Chol/HDL Ratio: 3 ratio (ref 0.0–5.0)
Cholesterol, Total: 140 mg/dL (ref 100–199)
HDL: 46 mg/dL (ref >39)
LDL Chol Calc (NIH): 81 mg/dL (ref 0–99)
Triglycerides: 65 mg/dL (ref 0–149)
VLDL Cholesterol Cal: 13 mg/dL (ref 5–40)

## 2021-03-15 ENCOUNTER — Encounter (HOSPITAL_COMMUNITY): Payer: Self-pay

## 2021-03-15 ENCOUNTER — Other Ambulatory Visit: Payer: Self-pay

## 2021-03-15 ENCOUNTER — Ambulatory Visit (HOSPITAL_COMMUNITY)
Admission: RE | Admit: 2021-03-15 | Discharge: 2021-03-15 | Disposition: A | Discharge status: Home / Self Care | Payer: Medicare HMO | Source: Ambulatory Visit | Attending: Adult Health | Admitting: Adult Health

## 2021-03-15 VITALS — BP 126/86 | HR 67 | Wt 183.2 lb

## 2021-03-15 DIAGNOSIS — E785 Hyperlipidemia, unspecified: Secondary | ICD-10-CM | POA: Insufficient documentation

## 2021-03-15 DIAGNOSIS — N1831 Chronic kidney disease, stage 3a: Secondary | ICD-10-CM | POA: Insufficient documentation

## 2021-03-15 DIAGNOSIS — I5042 Chronic combined systolic (congestive) and diastolic (congestive) heart failure: Secondary | ICD-10-CM | POA: Diagnosis not present

## 2021-03-15 DIAGNOSIS — I255 Ischemic cardiomyopathy: Secondary | ICD-10-CM | POA: Diagnosis not present

## 2021-03-15 DIAGNOSIS — I130 Hypertensive heart and chronic kidney disease with heart failure and stage 1 through stage 4 chronic kidney disease, or unspecified chronic kidney disease: Secondary | ICD-10-CM | POA: Insufficient documentation

## 2021-03-15 DIAGNOSIS — Z7982 Long term (current) use of aspirin: Secondary | ICD-10-CM | POA: Insufficient documentation

## 2021-03-15 DIAGNOSIS — Z87891 Personal history of nicotine dependence: Secondary | ICD-10-CM | POA: Insufficient documentation

## 2021-03-15 DIAGNOSIS — I10 Essential (primary) hypertension: Secondary | ICD-10-CM | POA: Diagnosis not present

## 2021-03-15 DIAGNOSIS — Z7901 Long term (current) use of anticoagulants: Secondary | ICD-10-CM | POA: Diagnosis not present

## 2021-03-15 DIAGNOSIS — E1122 Type 2 diabetes mellitus with diabetic chronic kidney disease: Secondary | ICD-10-CM | POA: Insufficient documentation

## 2021-03-15 DIAGNOSIS — Z79899 Other long term (current) drug therapy: Secondary | ICD-10-CM | POA: Insufficient documentation

## 2021-03-15 DIAGNOSIS — Z951 Presence of aortocoronary bypass graft: Secondary | ICD-10-CM | POA: Diagnosis not present

## 2021-03-15 DIAGNOSIS — Z7984 Long term (current) use of oral hypoglycemic drugs: Secondary | ICD-10-CM | POA: Insufficient documentation

## 2021-03-15 DIAGNOSIS — Z8249 Family history of ischemic heart disease and other diseases of the circulatory system: Secondary | ICD-10-CM | POA: Insufficient documentation

## 2021-03-15 DIAGNOSIS — I2510 Atherosclerotic heart disease of native coronary artery without angina pectoris: Secondary | ICD-10-CM | POA: Diagnosis not present

## 2021-03-15 MED ORDER — TORSEMIDE 20 MG PO TABS: 20.0000 mg | ORAL_TABLET | ORAL | 3 refills | Status: DC

## 2021-03-15 NOTE — Progress Notes (Signed)
ReDS Vest / Clip - 03/15/21 1100      ReDS Vest / Clip   Station Marker C    Ruler Value 27    ReDS Value Range Moderate volume overload    ReDS Actual Value 36

## 2021-03-15 NOTE — Patient Instructions (Signed)
TAKE Torsemide 20 mg today, then change to twice a week thereafter  Your physician recommends that you schedule a follow-up appointment in: 6-8 weeks  Do the following things EVERYDAY: 1) Weigh yourself in the morning before breakfast. Write it down and keep it in a log. 2) Take your medicines as prescribed 3) Eat low salt foods--Limit salt (sodium) to 2000 mg per day.  4) Stay as active as you can everyday 5) Limit all fluids for the day to less than 2 liters  At the [facility], you and your health needs are our priority. As part of our continuing mission to provide you with exceptional heart care, we have created designated Provider Care Teams. These Care Teams include your primary Cardiologist (physician) and Advanced Practice Providers (APPs- Physician Assistants and Nurse Practitioners) who all work together to provide you with the care you need, when you need it.   You may see any of the following providers on your designated Care Team at your next follow up: Marland Kitchen Dr Arvilla Meres . Dr Marca Ancona . Dr Thornell Mule . Tonye Becket, NP . Robbie Lis, PA . Shanda Bumps [place],NP . Karle Plumber, PharmD   Please be sure to bring in all your medications bottles to every appointment.   If you have any questions or concerns before your next appointment please send [place] a message through [place] or call our office at [phone].    TO LEAVE A MESSAGE FOR THE NURSE SELECT OPTION 2, PLEASE LEAVE A MESSAGE INCLUDING: . YOUR NAME . DATE OF BIRTH . CALL BACK NUMBER . REASON FOR CALL**this is important as we prioritize the call backs  YOU WILL RECEIVE A CALL BACK THE SAME DAY AS LONG AS YOU CALL BEFORE 4:00 PM

## 2021-03-15 NOTE — Progress Notes (Signed)
Advanced Heart Failure Clinic Note  Primary Care: [name], Elige Radon, MD Primary Cardiologist: Dr. Antoine Poche HF Cardiologist: Dr. Gala Romney  HPI: Joseph [name] is a 65 y.o. male withCAD (s/p CABG in 2000, Mvoview in 05/2008 showing scar with no ischemia, patent grafts in 2011), chronic combined systolic and diastolic CHF (EF25-30% in 2016and 35-40% by echo in 08/2020, s/p St. Jude ICD placement in 2011), HTN, HLD, Type 2 DM (diet-controlled) and Stage 3a CKD.  He recently has had multiple ED visits for acute CHF exacerbations. Underwent R/L cath on 09/23/2020 and showed severe native three-vessel CAD with patent LIMA to LAD, seq SVG-OM1-OM2, SVG-D1 and SVG-PDA.  He did have progression of distal LAD stenosis beyond the LIMA insertion site but continued medical therapy was recommended. Right heart hemodynamics were normal with normal filling pressures and preserved cardiac output.  Since the last episode he has drastically changed his diet. Less fluid intake. Weight down from 180 to 174 pounds. Now taking torsemide 20 bid. (recently was taking 60 daily but dropped back because creatinine increased to 1.62 (previously 1.1 - 1.3 in 08/2020)   Was on Entresto in past for almost a year but stopped due to AKI. Has not been on spiro or SGLT2i.  Had ECHO 01/2021 EF 30-35%.   Today he returns for HF follow up with his wife. Overall feeling fine. Denies SOB/PND/Orthopnea. Appetite ok. Had [employer] over the weekend.  No fever or chills. Weight at home 176-178. Says he took torsemide last night.   Pounds.SBP at home 110-120.  Taking all medications. Continues to work with [employer] 2 times a week.   ROS: All systems reviewed and negative except as per HPI.   Past Medical History:  Diagnosis Date  . CHF (congestive heart failure) (HCC)    a. EF 25-30% in 2016 b. 35-40% by echo in 10/2017 c. 35-40% in 08/2020  . COPD (chronic obstructive pulmonary disease) (HCC)   . Coronary  artery disease    a. s/p CABG in 2000 b. patent grafts in 2011 c. 08/2020: repeat cath showing severe native CAD with patent bypass grafts --> medical therapy recommended  . GI bleed    Mallory-Weiss tear EGD 02/2010  . Hyperlipidemia   . Hypertension   . Ileus (HCC) 2006   required hospitalization  . Left ventricular dysfunction    apical thrombus  . MRSA bacteremia   . 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 Jude  . Small bowel obstruction (HCC) 2007   resolved without surgery    Current Outpatient Medications  Medication Sig Dispense Refill  . acetaminophen (TYLENOL) 500 MG tablet Take 500 mg by mouth every 6 (six) hours as needed for pain.    Marland Kitchen albuterol (VENTOLIN HFA) 108 (90 Base) MCG/ACT inhaler Inhale 2 puffs into the lungs every 4 (four) hours as needed for wheezing or shortness of breath. 6.7 g 0  . aspirin EC 81 MG tablet Take 81 mg by mouth every evening. Swallow whole.    Marland Kitchen atorvastatin (LIPITOR) 80 MG tablet Take 1 tablet (80 mg total) by mouth every evening. 90 tablet 3  . empagliflozin (JARDIANCE) 10 MG TABS tablet Take 1 tablet (10 mg total) by mouth daily before breakfast. 30 tablet 6  . enalapril (VASOTEC) 20 MG tablet TAKE 1 TABLET BY MOUTH TWICE A DAY 180 tablet 3  . fish oil-omega-3 fatty acids 1000 MG capsule Take 1 g by mouth 3 (three) times daily.    Marland Kitchen  hydrOXYzine (VISTARIL) 25 MG capsule Take 1 capsule (25 mg total) by mouth 3 (three) times daily as needed. 30 capsule 0  . metoprolol succinate (TOPROL-XL) 100 MG 24 hr tablet Take 1 tablet (100 mg total) by mouth daily. Take with or immediately following a meal. 90 tablet 3  . Multiple Vitamin (MULTIVITAMIN WITH MINERALS) TABS tablet Take 1 tablet by mouth daily.    . nitroGLYCERIN (NITROSTAT) 0.4 MG SL tablet Place 1 tablet (0.4 mg total) under the tongue every 5 (five) minutes as needed for chest pain. 25 tablet 3  . omeprazole (PRILOSEC) 20 MG capsule TAKE 1  CAPSULE BY MOUTH EVERY DAY 90 capsule 3  . spironolactone (ALDACTONE) 25 MG tablet Take 0.5 tablets (12.5 mg total) by mouth daily. 45 tablet 3  . torsemide (DEMADEX) 20 MG tablet Take 1 tablet (20 mg total) by mouth as needed (for weight 177 or greater). 30 tablet 3   No current facility-administered medications for this encounter.    Allergies  Allergen Reactions  . Entresto [Sacubitril-Valsartan]     Worsening renal function and weight gain    Social History   Socioeconomic History  . Marital status: Married    Spouse name: Kathie Rhodes  . Number of children: 3  . Years of education: GED  . Highest education level: Not on file  Occupational History  . 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 Smoking status: Former Smoker    Types: Cigarettes    Start date: 11/28/1967    Quit date: 02/29/1988    Years since quitting: 33.0  . Smokeless tobacco: Never Used  Vaping Use  . Vaping Use: Never used  Substance and Sexual Activity  . Alcohol use: No    Alcohol/week: 0.0 standard drinks  . Drug use: No  . Sexual activity: Never  Other Topics Concern  . Not on file  Social History Narrative   1 son died age 59-Lee's syndrome         Social Determinants of Health   Financial Resource Strain: Not on file  Food Insecurity: Not on file  Transportation Needs: Not on file  Physical Activity: Not on file  Stress: Not on file  Social Connections: Not on file  Intimate Partner Violence: Not on file   Family History  Problem Relation Age of Onset  . Heart attack Mother   . Emphysema Father   . Coronary artery disease Father        s/p cabg  . Colon cancer Neg Hx    Vitals:   03/15/21 1125  BP: 126/86  Pulse: 67  SpO2: 98%  Weight: 83.1 kg (183 lb 3.2 oz)   Wt Readings from Last 3 Encounters:  02/28/21 83 kg (183 lb)  02/03/21 82.1 kg (181 lb)  01/20/21 83.5 kg (184 lb)    ReDS Vest / Clip - 03/15/21 1100      ReDS Vest / Clip   Station Marker C     Ruler Value 27    ReDS Value Range Moderate volume overload    ReDS Actual Value 36           PHYSICAL EXAM: General:  Well appearing. No resp difficulty. Walked in the clinic.  HEENT: normal Neck: supple. JVP 9-10. Carotids 2+ bilat; no bruits. No lymphadenopathy or thryomegaly appreciated. Cor: PMI nondisplaced. Regular rate & rhythm. No rubs, gallops or murmurs. Lungs: clear Abdomen: soft, nontender, nondistended. No hepatosplenomegaly. No bruits or masses. Good  bowel sounds. Extremities: no cyanosis, clubbing, rash, R and LLE 1+ edema Neuro: alert & orientedx3, cranial nerves grossly intact. moves all 4 extremities w/o difficulty. Affect pleasant  ASSESSMENT & PLAN: 1. 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 Patent grafts. RHC ok - s/p ST Jude ICD -Reds Clip 36%. Will need to start torsemide twice a week. Instructed to take extra 20 mg torsemide today.   - Continue Toprol-XL 100 mg daily. - Continue torsemide 20 mg prn weight >178 lbs. - Continue enalapril 20 mg bid. (Previously intolerant to [place]).  - Continue Jardiance 10 mg daily. No GU symptoms. - Continue  spiro 12.5 mg q hs - I reviewed BMET from 02/17/21, stable.   2. CAD - He is s/p CABG in 2000.  - LHC (10/2)1 showed severe native three-vessel CAD with patent LIMA to LAD, seq SVG-OM1-OM2, SVG-D1 and SVG-PDA. He did have progression of distal LAD stenosis beyond the LIMA insertion site but continued medical therapy was recommended. - No chest pain.  - Continue ASA, atorvastatin, Toprol-XL and SL NTG.   3. HTN - Stable.   4. Stage 3a CKD  - Baseline creatinine 1.1-1.3.  - Follows with Dr. Wolfgang Phoenix with [facility] in [place].  Follow up in 6 weeks to reassess volume status.   Tonye Becket, NP-C 03/15/21

## 2021-03-18 DIAGNOSIS — I129 Hypertensive chronic kidney disease with stage 1 through stage 4 chronic kidney disease, or unspecified chronic kidney disease: Secondary | ICD-10-CM | POA: Diagnosis not present

## 2021-03-18 DIAGNOSIS — N1831 Chronic kidney disease, stage 3a: Secondary | ICD-10-CM | POA: Diagnosis not present

## 2021-03-18 DIAGNOSIS — I5042 Chronic combined systolic (congestive) and diastolic (congestive) heart failure: Secondary | ICD-10-CM | POA: Diagnosis not present

## 2021-04-04 ENCOUNTER — Ambulatory Visit (INDEPENDENT_AMBULATORY_CARE_PROVIDER_SITE_OTHER): Payer: Medicare HMO

## 2021-04-04 DIAGNOSIS — I255 Ischemic cardiomyopathy: Secondary | ICD-10-CM | POA: Diagnosis not present

## 2021-04-04 LAB — CUP PACEART REMOTE DEVICE CHECK
Battery Remaining Longevity: 11 mo
Battery Remaining Percentage: 9 %
Battery Voltage: 2.66 V
Brady Statistic RV Percent Paced: 1 %
Date Time Interrogation Session: 20220509040015
HighPow Impedance: 50 Ohm
Implantable Lead Implant Date: 20110209
Implantable Lead Location: 753860
Implantable Lead Model: 7120
Implantable Pulse Generator Implant Date: 20110209
Lead Channel Impedance Value: 440 Ohm
Lead Channel Pacing Threshold Amplitude: 0.75 V
Lead Channel Pacing Threshold Pulse Width: 0.4 ms
Lead Channel Sensing Intrinsic Amplitude: 12 mV
Lead Channel Setting Pacing Amplitude: 2.5 V
Lead Channel Setting Pacing Pulse Width: 0.4 ms
Lead Channel Setting Sensing Sensitivity: 0.5 mV
Pulse Gen Serial Number: 747960

## 2021-04-20 ENCOUNTER — Encounter: Payer: Self-pay | Admitting: Student

## 2021-04-20 ENCOUNTER — Other Ambulatory Visit: Payer: Self-pay

## 2021-04-20 ENCOUNTER — Ambulatory Visit: Payer: Medicare HMO | Admitting: Student

## 2021-04-20 VITALS — BP 118/64 | HR 67 | Ht 71.0 in | Wt 182.8 lb

## 2021-04-20 DIAGNOSIS — E785 Hyperlipidemia, unspecified: Secondary | ICD-10-CM

## 2021-04-20 DIAGNOSIS — I10 Essential (primary) hypertension: Secondary | ICD-10-CM | POA: Diagnosis not present

## 2021-04-20 DIAGNOSIS — I5022 Chronic systolic (congestive) heart failure: Secondary | ICD-10-CM

## 2021-04-20 DIAGNOSIS — I2510 Atherosclerotic heart disease of native coronary artery without angina pectoris: Secondary | ICD-10-CM | POA: Diagnosis not present

## 2021-04-20 DIAGNOSIS — N1832 Chronic kidney disease, stage 3b: Secondary | ICD-10-CM

## 2021-04-20 MED ORDER — ENALAPRIL MALEATE 20 MG PO TABS: 20.0000 mg | ORAL_TABLET | Freq: Two times a day (BID) | ORAL | 3 refills | Status: AC

## 2021-04-20 NOTE — Progress Notes (Signed)
Cardiology Office Note    Date:  04/20/2021   ID:  Joseph [name], Joseph [name] [birth year], MRN [id number]  PCP:  Joseph [name], Joseph Radon, MD  Cardiologist: Joseph Rotunda, MD  ---> Patient wishes to follow-up in [place] with Dr. Diona Browner EP:Dr. Ladona [name] Advanced Heart Failure: Dr. Gala [name]  Chief Complaint  Patient presents with  . Follow-up    3 month visit    History of Present Illness:    Joseph [name] is a 65 y.o. male with past medical history ofCAD (s/p CABG in 2000, patent grafts by cath in 2011 and patent grafts by repeat cath in 08/2020), chronic combined systolic and diastolic CHF (EF25-30% in 2016, at35-40% by echo in 10/2017,30-35% in 01/2019,and 35-40% by echo in 08/2020,s/p St. Jude ICD placement in 2011), HTN, HLD, Type 2 DM (diet-controlled) and Stage 3 CKD who presents to the office today for 89-month follow-up.  He was examined by myself in 12/2020 and reported making significant dietary changes since his last visit and had been trying to limit his sodium intake. He was taking Torsemide on a PRN basis and was continued on Toprol-XL, Enalapril and Jardiance. He had previously been intolerant to [facility]. He did follow-up with Advanced Heart Failure in 01/2021 and was started on Spironolactone 12.5 mg daily. He had a repeat follow-up visit last month for further medication titration and ReDS Vest at that time was consistent with moderate volume overload. It was recommended that he take Torsemide 20 mg at least twice a week and extra doses as needed.   In talking with the patient and his wife today, he reports overall doing well since his last office visit. His breathing has been stable and he denies any orthopnea, PND or pitting edema. He has been keeping an excellent log of his weights along with his blood pressure and heart rate. His vitals have been well controlled and his weight has been variable from 174 lbs to 179 lbs. He does take Torsemide at least twice a week and takes a  dose on the days that his weight is above 177 lbs.    Past Medical History:  Diagnosis Date  . CHF (congestive heart failure) (HCC)    a. EF 25-30% in 2016 b. 35-40% by echo in 10/2017 c. 35-40% in 08/2020  . COPD (chronic obstructive pulmonary disease) (HCC)   . Coronary artery disease    a. s/p CABG in 2000 b. patent grafts in 2011 c. 08/2020: repeat cath showing severe native CAD with patent bypass grafts --> medical therapy recommended  . GI bleed    Mallory-Weiss tear EGD 02/2010  . Hyperlipidemia   . Hypertension   . Ileus (HCC) 2006   required hospitalization  . Left ventricular dysfunction    apical thrombus  . MRSA bacteremia   . 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 Jude  . Small bowel obstruction (HCC) 2007   resolved without surgery    Past Surgical History:  Procedure Laterality Date  . basilic vein left arm resection    . CARDIAC DEFIBRILLATOR PLACEMENT    . CORONARY ARTERY BYPASS GRAFT     6 vessels, 2000  . INSERT / REPLACE / REMOVE PACEMAKER  01/05/10   ICD insertion/St. Jude single chamber  . RIGHT/LEFT HEART CATH AND CORONARY/GRAFT ANGIOGRAPHY N/A 09/23/2020   Procedure: RIGHT/LEFT HEART CATH AND CORONARY/GRAFT ANGIOGRAPHY;  Surgeon: Joseph Bollman, MD;  Location: [facility] INVASIVE CV LAB;  Service: Cardiovascular;  Laterality: N/A;  Current Medications: Outpatient Medications Prior to Visit  Medication Sig Dispense Refill  . acetaminophen (TYLENOL) 500 MG tablet Take 500 mg by mouth every 6 (six) hours as needed for pain.    Marland Kitchen albuterol (VENTOLIN HFA) 108 (90 Base) MCG/ACT inhaler Inhale 2 puffs into the lungs every 4 (four) hours as needed for wheezing or shortness of breath. 6.7 g 0  . aspirin EC 81 MG tablet Take 81 mg by mouth every evening. Swallow whole.    Marland Kitchen atorvastatin (LIPITOR) 80 MG tablet Take 1 tablet (80 mg total) by mouth every evening. 90 tablet 3  . empagliflozin (JARDIANCE) 10 MG TABS tablet  Take 1 tablet (10 mg total) by mouth daily before breakfast. 30 tablet 6  . fish oil-omega-3 fatty acids 1000 MG capsule Take 1 g by mouth 3 (three) times daily.    . hydrOXYzine (VISTARIL) 25 MG capsule Take 1 capsule (25 mg total) by mouth 3 (three) times daily as needed. 30 capsule 0  . metoprolol succinate (TOPROL-XL) 100 MG 24 hr tablet Take 1 tablet (100 mg total) by mouth daily. Take with or immediately following a meal. 90 tablet 3  . Multiple Vitamin (MULTIVITAMIN WITH MINERALS) TABS tablet Take 1 tablet by mouth daily.    . nitroGLYCERIN (NITROSTAT) 0.4 MG SL tablet Place 1 tablet (0.4 mg total) under the tongue every 5 (five) minutes as needed for chest pain. 25 tablet 3  . omeprazole (PRILOSEC) 20 MG capsule TAKE 1 CAPSULE BY MOUTH EVERY DAY 90 capsule 3  . spironolactone (ALDACTONE) 25 MG tablet Take 0.5 tablets (12.5 mg total) by mouth daily. 45 tablet 3  . torsemide (DEMADEX) 20 MG tablet Take 1 tablet (20 mg total) by mouth 2 (two) times a week. 30 tablet 3  . enalapril (VASOTEC) 20 MG tablet TAKE 1 TABLET BY MOUTH TWICE A DAY 180 tablet 3   No facility-administered medications prior to visit.     Allergies:   Entresto [sacubitril-valsartan]   Social History   Socioeconomic History  . Marital status: Married    Spouse name: Joseph [name]  . Number of children: 3  . Years of education: GED  . Highest education level: Not on file  Occupational History  . 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 Smoking status: Former Smoker    Types: Cigarettes    Start date: 11/28/1967    Quit date: 02/29/1988    Years since quitting: 33.1  . Smokeless tobacco: Never Used  Vaping Use  . Vaping Use: Never used  Substance and Sexual Activity  . Alcohol use: No    Alcohol/week: 0.0 standard drinks  . Drug use: No  . Sexual activity: Never  Other Topics Concern  . Not on file  Social History Narrative   1 son died age 78-Joseph [name]'s syndrome         Social Determinants  of Health   Financial Resource Strain: Not on file  Food Insecurity: Not on file  Transportation Needs: Not on file  Physical Activity: Not on file  Stress: Not on file  Social Connections: Not on file     Family History:  The patient's family history includes Coronary artery disease in his father; Emphysema in his father; Heart attack in his mother.   Review of Systems:    Please see the history of present illness.     All other systems reviewed and are otherwise negative except as noted above.   Physical Exam:  VS:  BP 118/64   Pulse 67   Ht 5\' 11"  (1.803 m)   Wt 182 lb 12.8 oz (82.9 kg)   SpO2 98%   BMI 25.50 kg/m    General: Well developed, well nourished,male appearing in no acute distress. Head: Normocephalic, atraumatic. Neck: No carotid bruits. JVD not elevated.  Lungs: Respirations regular and unlabored, without wheezes or rales.  Heart: Regular rate and rhythm. No S3 or S4.  No murmur, no rubs, or gallops appreciated. Abdomen: Appears non-distended. No obvious abdominal masses. Msk:  Strength and tone appear normal for age. No obvious joint deformities or effusions. Extremities: No clubbing or cyanosis. Trace ankle edema bilaterally.  Distal pedal pulses are 2+ bilaterally. Neuro: Alert and oriented X 3. Moves all extremities spontaneously. No focal deficits noted. Psych:  Responds to questions appropriately with a normal affect. Skin: No rashes or lesions noted  Wt Readings from Last 3 Encounters:  04/20/21 182 lb 12.8 oz (82.9 kg)  03/15/21 183 lb 3.2 oz (83.1 kg)  02/28/21 183 lb (83 kg)      Studies/Labs Reviewed:   EKG:  EKG is not ordered today.  Recent Labs: 10/14/2020: ALT 26 02/03/2021: B Natriuretic Peptide 121.9 02/17/2021: BUN 17; Creatinine, Ser 1.52; Potassium 4.5; Sodium 139 03/10/2021: Hemoglobin 13.6; Platelets 242   Lipid Panel    Component Value Date/Time   CHOL 140 03/10/2021 1058   TRIG 65 03/10/2021 1058   HDL 46 03/10/2021  1058   CHOLHDL 3.0 03/10/2021 1058   CHOLHDL 3.2 03/08/2010 0411   VLDL 10 03/08/2010 0411   LDLCALC 81 03/10/2021 1058    Additional studies/ records that were reviewed today include:   Echocardiogram: 08/2020 Summary  1. The left ventricle is mildly dilated in size with mildly increased wall  thickness.  2. The left ventricular systolic function is moderately decreased, LVEF is  visually estimated at 35-40%.  3. There is grade I diastolic dysfunction (impaired relaxation).  4. There is mild mitral valve regurgitation.  5. The left atrium is mildly to moderately dilated in size.  6. The right ventricle is normal in size, with normal systolic function.    Cardiac Catheterization: 08/2020 1.  Severe native three-vessel coronary artery disease with total occlusion of the mid LAD, mid circumflex, and distal RCA 2.  Status post aortocoronary bypass surgery with continued patency of the LIMA to LAD, sequential saphenous vein graft to OM1 and OM 2, saphenous vein graft to diagonal 1, and saphenous vein graft to PDA 3.  Diffuse mid and distal LAD stenosis beyond the LIMA insertion site, progressive from the previous catheterization 10 years ago 4.  Essentially normal right heart hemodynamics with normal filling pressures and preserved cardiac output  Recommendations: Medical therapy for heart failure and coronary artery disease.  I do not appreciate any targets for PCI.  The patient's bypass grafts all remain patent.   Assessment:    1. Coronary artery disease involving native coronary artery of native heart without angina pectoris   2. Chronic systolic heart failure (HCC)   3. Essential hypertension   4. Hyperlipidemia LDL goal <70   5. Stage 3b chronic kidney disease (HCC)      Plan:   In order of problems listed above:  1. CAD - He is s/p CABG in 2000 with patent grafts by cath in 2011 and by repeat cath in 08/2020. He denies any recent anginal symptoms and  remains active at baseline.  - Continue ASA 81mg  daily, Toprol-XL 100mg   daily and Atorvastatin 80mg  daily.   2. HFrEF - His EF was at 35-40% by echo in 08/2020 and he is s/p St. Jude ICD placement in 2011 which is followed by Dr. 2012.  - He has trace edema on examination today but lungs are clear. He is currently on Jardiance 10mg  daily, Enalapril 20mg  BID (intolerant to Entresto), Toprol-XL 100mg  daily and Spironolactone 12.5mg  daily. Somewhat hesitant to further titrate Spironolactone given his K+ has been at the high-end of normal intermittently over the past year. For now, will continue his current regimen and he is to continue taking Torsemide 20mg  twice weekly and if his weight goes above 177 lbs on his home scales. He does have follow-up with Advanced Heart Failure next week and if his ReDS Vest reading is consistent with fluid overload, may need to titrate Torsemide to 20mg  every other day (previously had an AKI with taking daily dosing).   3. HTN - BP is well-controlled at 118/64 during today's visit and has been well-controlled when checked at home as well. Continue current medication regimen.   4. HLD - Followed by PCP. FLP last month showed total cholesterol of 140, HDL 46, triglycerides 65 and LDL 81. He has been making dietary changes. Remains on Atorvastatin 80mg  daily and Fish Oil. If LDL remains above goal, would plan to add Zetia.   5. Stage 3 CKD - Followed by Dr. Ladona [name]. By review of Care Everywhere, creatinine was stable at 1.52 when checked on 03/10/2021 with K+ at 4.3.   Medication Adjustments/Labs and Tests Ordered: Current medicines are reviewed at length with the patient today.  Concerns regarding medicines are outlined above.  Medication changes, Labs and Tests ordered today are listed in the Patient Instructions below. Patient Instructions  Medication Instructions:  Your physician recommends that you continue on your current medications as directed. Please refer to  the Current Medication list given to you today.  *If you need a refill on your cardiac medications before your next appointment, please call your pharmacy*   Lab Work: NONE   If you have labs (blood work) drawn today and your tests are completely normal, you will receive your results only by: MyChart Message (if you have MyChart) OR . A paper copy in the mail If you have any lab test that is abnormal or we need to change your treatment, we will call you to review the results.   Testing/Procedures: NONE    Follow-Up: At [facility], you and your health needs are our priority.  As part of our continuing mission to provide you with exceptional heart care, we have created designated Provider Care Teams.  These Care Teams include your primary Cardiologist (physician) and Advanced Practice Providers (APPs -  Physician Assistants and Nurse Practitioners) who all work together to provide you with the care you need, when you need it.  We recommend signing up for the patient portal called "MyChart".  Sign up information is provided on this After Visit Summary.  MyChart is used to connect with patients for Virtual Visits (Telemedicine).  Patients are able to view lab/test results, encounter notes, upcoming appointments, etc.  Non-urgent messages can be sent to your provider as well.   To learn more about what you can do with MyChart, go to .    Your next appointment:   3-4 month(s)  The format for your next appointment:   In Person  Provider:   , MD or , PA-C   Other Instructions  Thank you for choosing [facility]!       Signed, Ellsworth Lennox, PA-C  04/20/2021 8:06 PM    [facility] [address] [place], [place] [address] Phone: [phone] Fax: [phone]

## 2021-04-20 NOTE — Patient Instructions (Signed)
Medication Instructions:  Your physician recommends that you continue on your current medications as directed. Please refer to the Current Medication list given to you today.  *If you need a refill on your cardiac medications before your next appointment, please call your pharmacy*   Lab Work: NONE   If you have labs (blood work) drawn today and your tests are completely normal, you will receive your results only by: MyChart Message (if you have MyChart) OR A paper copy in the mail If you have any lab test that is abnormal or we need to change your treatment, we will call you to review the results.   Testing/Procedures: NONE    Follow-Up: At [facility], you and your health needs are our priority.  As part of our continuing mission to provide you with exceptional heart care, we have created designated Provider Care Teams.  These Care Teams include your primary Cardiologist (physician) and Advanced Practice Providers (APPs -  Physician Assistants and Nurse Practitioners) who all work together to provide you with the care you need, when you need it.  We recommend signing up for the patient portal called "MyChart".  Sign up information is provided on this After Visit Summary.  MyChart is used to connect with patients for Virtual Visits (Telemedicine).  Patients are able to view lab/test results, encounter notes, upcoming appointments, etc.  Non-urgent messages can be sent to your provider as well.   To learn more about what you can do with MyChart, go to [email or web address].    Your next appointment:   3-4 month(s)  The format for your next appointment:   In Person  Provider:   Samuel McDowell, MD or Brittany Strader, PA-C   Other Instructions Thank you for choosing [facility]!    

## 2021-04-22 NOTE — Progress Notes (Signed)
Remote ICD transmission.   

## 2021-04-27 NOTE — Progress Notes (Signed)
Advanced Heart Failure Clinic Note  Primary Care: [name], Elige Radon, MD Primary Cardiologist: Dr. Antoine Poche HF Cardiologist: Dr. Gala Romney  HPI: Joseph [name] is a 65 y.o. male withCAD (s/p CABG in 2000, Mvoview in 05/2008 showing scar with no ischemia, patent grafts in 2011), chronic combined systolic and diastolic CHF (EF25-30% in 2016and 35-40% by echo in 08/2020, s/p St. Jude ICD placement in 2011), HTN, HLD, Type 2 DM (diet-controlled) and Stage 3a CKD.  He recently has had multiple ED visits for acute CHF exacerbations. Underwent R/L cath on 09/23/2020 and showed severe native three-vessel CAD with patent LIMA to LAD, seq SVG-OM1-OM2, SVG-D1 and SVG-PDA.  He did have progression of distal LAD stenosis beyond the LIMA insertion site but continued medical therapy was recommended. Right heart hemodynamics were normal with normal filling pressures and preserved cardiac output.  Since the last episode he has drastically changed his diet. Less fluid intake. Weight down from 180 to 174 pounds. Now taking torsemide 20 bid. (recently was taking 60 daily but dropped back because creatinine increased to 1.62 (previously 1.1 - 1.3 in 08/2020)   Was on Entresto in past for almost a year but stopped due to AKI. Has not been on spiro or SGLT2i.  Had ECHO 01/2021 EF 30-35%.   Today he returns for HF follow up. Last visit torsemide was increased to torsemide 20 mg twice a week. Taking extra 20 mg about once a week. Overall feeling fine. Denies SOB/PND/Orthopnea. Appetite ok. No fever or chills. Weight at home 175-179 pounds. Taking all medications. Continues to work with [employer] 2 times a week.   ROS: All systems reviewed and negative except as per HPI.   Past Medical History:  Diagnosis Date  . CHF (congestive heart failure) (HCC)    a. EF 25-30% in 2016 b. 35-40% by echo in 10/2017 c. 35-40% in 08/2020  . COPD (chronic obstructive pulmonary disease) (HCC)   . Coronary  artery disease    a. s/p CABG in 2000 b. patent grafts in 2011 c. 08/2020: repeat cath showing severe native CAD with patent bypass grafts --> medical therapy recommended  . GI bleed    Mallory-Weiss tear EGD 02/2010  . Hyperlipidemia   . Hypertension   . Ileus (HCC) 2006   required hospitalization  . Left ventricular dysfunction    apical thrombus  . MRSA bacteremia   . 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 Jude  . Small bowel obstruction (HCC) 2007   resolved without surgery    Current Outpatient Medications  Medication Sig Dispense Refill  . albuterol (VENTOLIN HFA) 108 (90 Base) MCG/ACT inhaler Inhale 2 puffs into the lungs every 4 (four) hours as needed for wheezing or shortness of breath. 6.7 g 0  . aspirin EC 81 MG tablet Take 81 mg by mouth every evening. Swallow whole.    Marland Kitchen atorvastatin (LIPITOR) 80 MG tablet Take 1 tablet (80 mg total) by mouth every evening. 90 tablet 3  . empagliflozin (JARDIANCE) 10 MG TABS tablet Take 1 tablet (10 mg total) by mouth daily before breakfast. 30 tablet 6  . enalapril (VASOTEC) 20 MG tablet Take 1 tablet (20 mg total) by mouth 2 (two) times daily. 180 tablet 3  . hydrOXYzine (VISTARIL) 25 MG capsule Take 1 capsule (25 mg total) by mouth 3 (three) times daily as needed. 30 capsule 0  . metoprolol succinate (TOPROL-XL) 100 MG 24 hr tablet Take 1 tablet (100 mg total)  by mouth daily. Take with or immediately following a meal. 90 tablet 3  . Multiple Vitamin (MULTIVITAMIN WITH MINERALS) TABS tablet Take 1 tablet by mouth daily.    . nitroGLYCERIN (NITROSTAT) 0.4 MG SL tablet Place 1 tablet (0.4 mg total) under the tongue every 5 (five) minutes as needed for chest pain. 25 tablet 3  . omeprazole (PRILOSEC) 20 MG capsule TAKE 1 CAPSULE BY MOUTH EVERY DAY 90 capsule 3  . spironolactone (ALDACTONE) 25 MG tablet Take 0.5 tablets (12.5 mg total) by mouth daily. 45 tablet 3  . torsemide (DEMADEX) 20 MG  tablet Take 1 tablet (20 mg total) by mouth 2 (two) times a week. 30 tablet 3  . acetaminophen (TYLENOL) 500 MG tablet Take 500 mg by mouth every 6 (six) hours as needed for pain.    . fish oil-omega-3 fatty acids 1000 MG capsule Take 1 g by mouth 3 (three) times daily.     No current facility-administered medications for this encounter.    Allergies  Allergen Reactions  . Entresto [Sacubitril-Valsartan]     Worsening renal function and weight gain    Social History   Socioeconomic History  . Marital status: Married    Spouse name: Kathie Rhodes  . Number of children: 3  . Years of education: GED  . Highest education level: Not on file  Occupational History  . 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 Smoking status: Former Smoker    Types: Cigarettes    Start date: 11/28/1967    Quit date: 02/29/1988    Years since quitting: 33.1  . Smokeless tobacco: Never Used  Vaping Use  . Vaping Use: Never used  Substance and Sexual Activity  . Alcohol use: No    Alcohol/week: 0.0 standard drinks  . Drug use: No  . Sexual activity: Never  Other Topics Concern  . Not on file  Social History Narrative   1 son died age 62-Lee's syndrome         Social Determinants of Health   Financial Resource Strain: Not on file  Food Insecurity: Not on file  Transportation Needs: Not on file  Physical Activity: Not on file  Stress: Not on file  Social Connections: Not on file  Intimate Partner Violence: Not on file   Family History  Problem Relation Age of Onset  . Heart attack Mother   . Emphysema Father   . Coronary artery disease Father        s/p cabg  . Colon cancer Neg Hx    Vitals:   04/28/21 1043  BP: 108/80  Pulse: 76  SpO2: 99%  Weight: 83 kg   Wt Readings from Last 3 Encounters:  04/28/21 83 kg  04/20/21 82.9 kg  03/15/21 83.1 kg     ReDS Vest / Clip - 04/28/21 1100      ReDS Vest / Clip   Station Marker C    Ruler Value 27    ReDS Value Range  Low volume    ReDS Actual Value 32    Anatomical Comments sitting           PHYSICAL EXAM: General:  Well appearing. No resp difficulty HEENT: normal Neck: supple. no JVD. Carotids 2+ bilat; no bruits. No lymphadenopathy or thryomegaly appreciated. Cor: PMI nondisplaced. Regular rate & rhythm. No rubs, gallops or murmurs. Lungs: clear Abdomen: soft, nontender, nondistended. No hepatosplenomegaly. No bruits or masses. Good bowel sounds. Extremities: no cyanosis, clubbing, rash,  edema Neuro: alert & orientedx3, cranial nerves grossly intact. moves all 4 extremities w/o difficulty. Affect pleasant  ASSESSMENT & PLAN: 1. 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 Patent grafts. RHC ok - s/p ST Jude ICD -Reds Clip 32%  Discussed results. Volume status stable. NYHA I  . Continue torsemide twice a week with an extra 20 mg for weight 177 pounds.  - Continue Toprol-XL 100 mg daily. - Continue enalapril 20 mg bid. (Previously intolerant to [place]).  - Continue Jardiance 10 mg daily. No GU symptoms. Check with pharmacy tech for copay assistance.  - Continue  spiro 12.5 mg q hs - Check BMET   2. CAD - He is s/p CABG in 2000.  - LHC (10/2)1 showed severe native three-vessel CAD with patent LIMA to LAD, seq SVG-OM1-OM2, SVG-D1 and SVG-PDA. He did have progression of distal LAD stenosis beyond the LIMA insertion site but continued medical therapy was recommended. - No chest pain.  - Continue ASA, atorvastatin, Toprol-XL and SL NTG.   3. HTN - Stable.   4. Stage 3a CKD  - Baseline creatinine 1.1-1.3.  - Follows with Dr. Wolfgang Phoenix with [facility] in [place]. Check BMET today   Follow in 3 months with Dr Gala Romney.    Tonye Becket, NP-C 04/28/21

## 2021-04-28 ENCOUNTER — Telehealth (HOSPITAL_COMMUNITY): Payer: Self-pay | Admitting: Pharmacy Technician

## 2021-04-28 ENCOUNTER — Other Ambulatory Visit (HOSPITAL_COMMUNITY): Payer: Self-pay

## 2021-04-28 ENCOUNTER — Ambulatory Visit (HOSPITAL_COMMUNITY)
Admission: RE | Admit: 2021-04-28 | Discharge: 2021-04-28 | Disposition: A | Discharge status: Home / Self Care | Payer: Medicare HMO | Source: Ambulatory Visit | Attending: Adult Health | Admitting: Adult Health

## 2021-04-28 ENCOUNTER — Other Ambulatory Visit: Payer: Self-pay

## 2021-04-28 ENCOUNTER — Encounter (HOSPITAL_COMMUNITY): Payer: Self-pay

## 2021-04-28 VITALS — BP 108/80 | HR 76 | Wt 183.0 lb

## 2021-04-28 DIAGNOSIS — Z8249 Family history of ischemic heart disease and other diseases of the circulatory system: Secondary | ICD-10-CM | POA: Insufficient documentation

## 2021-04-28 DIAGNOSIS — I5042 Chronic combined systolic (congestive) and diastolic (congestive) heart failure: Secondary | ICD-10-CM | POA: Insufficient documentation

## 2021-04-28 DIAGNOSIS — Z87891 Personal history of nicotine dependence: Secondary | ICD-10-CM | POA: Insufficient documentation

## 2021-04-28 DIAGNOSIS — Z7982 Long term (current) use of aspirin: Secondary | ICD-10-CM | POA: Diagnosis not present

## 2021-04-28 DIAGNOSIS — I130 Hypertensive heart and chronic kidney disease with heart failure and stage 1 through stage 4 chronic kidney disease, or unspecified chronic kidney disease: Secondary | ICD-10-CM | POA: Insufficient documentation

## 2021-04-28 DIAGNOSIS — Z7984 Long term (current) use of oral hypoglycemic drugs: Secondary | ICD-10-CM | POA: Diagnosis not present

## 2021-04-28 DIAGNOSIS — Z951 Presence of aortocoronary bypass graft: Secondary | ICD-10-CM | POA: Insufficient documentation

## 2021-04-28 DIAGNOSIS — J449 Chronic obstructive pulmonary disease, unspecified: Secondary | ICD-10-CM | POA: Insufficient documentation

## 2021-04-28 DIAGNOSIS — Z95810 Presence of automatic (implantable) cardiac defibrillator: Secondary | ICD-10-CM | POA: Insufficient documentation

## 2021-04-28 DIAGNOSIS — E1122 Type 2 diabetes mellitus with diabetic chronic kidney disease: Secondary | ICD-10-CM | POA: Insufficient documentation

## 2021-04-28 DIAGNOSIS — N1831 Chronic kidney disease, stage 3a: Secondary | ICD-10-CM | POA: Diagnosis not present

## 2021-04-28 DIAGNOSIS — E785 Hyperlipidemia, unspecified: Secondary | ICD-10-CM | POA: Diagnosis not present

## 2021-04-28 DIAGNOSIS — Z79899 Other long term (current) drug therapy: Secondary | ICD-10-CM | POA: Diagnosis not present

## 2021-04-28 DIAGNOSIS — I5022 Chronic systolic (congestive) heart failure: Secondary | ICD-10-CM | POA: Diagnosis not present

## 2021-04-28 DIAGNOSIS — I10 Essential (primary) hypertension: Secondary | ICD-10-CM

## 2021-04-28 DIAGNOSIS — I2510 Atherosclerotic heart disease of native coronary artery without angina pectoris: Secondary | ICD-10-CM | POA: Diagnosis not present

## 2021-04-28 DIAGNOSIS — N1832 Chronic kidney disease, stage 3b: Secondary | ICD-10-CM

## 2021-04-28 LAB — BASIC METABOLIC PANEL
Anion gap: 7 (ref 5–15)
BUN: 27 mg/dL — ABNORMAL HIGH (ref 8–23)
CO2: 27 mmol/L (ref 22–32)
Calcium: 9.1 mg/dL (ref 8.9–10.3)
Chloride: 101 mmol/L (ref 98–111)
Creatinine, Ser: 1.45 mg/dL — ABNORMAL HIGH (ref 0.61–1.24)
GFR, Estimated: 53 mL/min — ABNORMAL LOW (ref >60)
Glucose, Bld: 96 mg/dL (ref 70–99)
Potassium: 5.1 mmol/L (ref 3.5–5.1)
Sodium: 135 mmol/L (ref 135–145)

## 2021-04-28 NOTE — Telephone Encounter (Addendum)
Advanced Heart Failure Patient Advocate Encounter  Patient was seen in clinic today and expressed that he was having a hard time affording Jardiance. The current 30 day co-pay is $47, which is a barrier. Started an application for [facility].   Will fax in once signatures are obtained.  Looked over paperwork. Patient did not fill in income information, hh size. Called and left patient vm. Cannot send until he calls back with that information.

## 2021-04-28 NOTE — Progress Notes (Signed)
ReDS Vest / Clip - 04/28/21 1100      ReDS Vest / Clip   Station Marker C    Ruler Value 27    ReDS Value Range Low volume    ReDS Actual Value 32    Anatomical Comments sitting         '

## 2021-04-28 NOTE — Patient Instructions (Signed)
Labs today We will only contact you if something comes back abnormal or we need to make some changes. Otherwise no news is good news!  Your physician recommends that you schedule a follow-up appointment in: 3 months with Dr Bensimhon  Please call office at [phone] option 2 if you have any questions or concerns.   At the Advanced Heart Failure Clinic, you and your health needs are our priority. As part of our continuing mission to provide you with exceptional heart care, we have created designated Provider Care Teams. These Care Teams include your primary Cardiologist (physician) and Advanced Practice Providers (APPs- Physician Assistants and Nurse Practitioners) who all work together to provide you with the care you need, when you need it.   You may see any of the following providers on your designated Care Team at your next follow up: . Dr Daniel Bensimhon . Dr Dalton McLean . Dr Brandon Winfrey . Amy Clegg, NP . Brittainy Simmons, PA . Jessica Milford,NP . Lauren Kemp, PharmD   Please be sure to bring in all your medications bottles to every appointment.     

## 2021-05-02 ENCOUNTER — Telehealth (HOSPITAL_COMMUNITY): Payer: Self-pay

## 2021-05-02 NOTE — Telephone Encounter (Signed)
Patient saw you last week and wanted to know if he was cleared to return to return to work. If so he would like a clearance letter to turn into his job. Please advise

## 2021-05-03 ENCOUNTER — Observation Stay (HOSPITAL_COMMUNITY)
Admission: EM | Admit: 2021-05-03 | Discharge: 2021-05-03 | Disposition: A | Discharge status: Home / Self Care | Payer: Medicare HMO | Attending: Family Medicine | Admitting: Family Medicine

## 2021-05-03 ENCOUNTER — Other Ambulatory Visit: Payer: Self-pay

## 2021-05-03 ENCOUNTER — Emergency Department (HOSPITAL_COMMUNITY): Payer: Medicare HMO

## 2021-05-03 ENCOUNTER — Observation Stay (HOSPITAL_COMMUNITY): Payer: Medicare HMO

## 2021-05-03 DIAGNOSIS — I130 Hypertensive heart and chronic kidney disease with heart failure and stage 1 through stage 4 chronic kidney disease, or unspecified chronic kidney disease: Secondary | ICD-10-CM | POA: Insufficient documentation

## 2021-05-03 DIAGNOSIS — Z79899 Other long term (current) drug therapy: Secondary | ICD-10-CM | POA: Diagnosis not present

## 2021-05-03 DIAGNOSIS — R0602 Shortness of breath: Secondary | ICD-10-CM | POA: Diagnosis not present

## 2021-05-03 DIAGNOSIS — R0789 Other chest pain: Secondary | ICD-10-CM | POA: Diagnosis not present

## 2021-05-03 DIAGNOSIS — I5022 Chronic systolic (congestive) heart failure: Secondary | ICD-10-CM | POA: Diagnosis not present

## 2021-05-03 DIAGNOSIS — E1122 Type 2 diabetes mellitus with diabetic chronic kidney disease: Secondary | ICD-10-CM

## 2021-05-03 DIAGNOSIS — R062 Wheezing: Principal | ICD-10-CM | POA: Insufficient documentation

## 2021-05-03 DIAGNOSIS — Z87891 Personal history of nicotine dependence: Secondary | ICD-10-CM | POA: Diagnosis not present

## 2021-05-03 DIAGNOSIS — I2510 Atherosclerotic heart disease of native coronary artery without angina pectoris: Secondary | ICD-10-CM | POA: Diagnosis not present

## 2021-05-03 DIAGNOSIS — J449 Chronic obstructive pulmonary disease, unspecified: Secondary | ICD-10-CM | POA: Diagnosis not present

## 2021-05-03 DIAGNOSIS — I10 Essential (primary) hypertension: Secondary | ICD-10-CM | POA: Diagnosis not present

## 2021-05-03 DIAGNOSIS — Z7982 Long term (current) use of aspirin: Secondary | ICD-10-CM | POA: Diagnosis not present

## 2021-05-03 DIAGNOSIS — R0609 Other forms of dyspnea: Secondary | ICD-10-CM | POA: Diagnosis not present

## 2021-05-03 DIAGNOSIS — Z951 Presence of aortocoronary bypass graft: Secondary | ICD-10-CM

## 2021-05-03 DIAGNOSIS — Z20822 Contact with and (suspected) exposure to covid-19: Secondary | ICD-10-CM | POA: Insufficient documentation

## 2021-05-03 DIAGNOSIS — R069 Unspecified abnormalities of breathing: Secondary | ICD-10-CM | POA: Diagnosis not present

## 2021-05-03 DIAGNOSIS — N183 Chronic kidney disease, stage 3 unspecified: Secondary | ICD-10-CM | POA: Insufficient documentation

## 2021-05-03 DIAGNOSIS — R0600 Dyspnea, unspecified: Secondary | ICD-10-CM | POA: Diagnosis present

## 2021-05-03 DIAGNOSIS — E1169 Type 2 diabetes mellitus with other specified complication: Secondary | ICD-10-CM | POA: Diagnosis present

## 2021-05-03 DIAGNOSIS — Z95810 Presence of automatic (implantable) cardiac defibrillator: Secondary | ICD-10-CM | POA: Diagnosis not present

## 2021-05-03 DIAGNOSIS — R079 Chest pain, unspecified: Secondary | ICD-10-CM | POA: Insufficient documentation

## 2021-05-03 DIAGNOSIS — I255 Ischemic cardiomyopathy: Secondary | ICD-10-CM | POA: Diagnosis present

## 2021-05-03 DIAGNOSIS — Z743 Need for continuous supervision: Secondary | ICD-10-CM | POA: Diagnosis not present

## 2021-05-03 LAB — CBC
HCT: 45.6 % (ref 39.0–52.0)
Hemoglobin: 14.8 g/dL (ref 13.0–17.0)
MCH: 29.5 pg (ref 26.0–34.0)
MCHC: 32.5 g/dL (ref 30.0–36.0)
MCV: 91 fL (ref 80.0–100.0)
Platelets: 225 10*3/uL (ref 150–400)
RBC: 5.01 MIL/uL (ref 4.22–5.81)
RDW: 13.7 % (ref 11.5–15.5)
WBC: 10.1 10*3/uL (ref 4.0–10.5)
nRBC: 0 % (ref 0.0–0.2)

## 2021-05-03 LAB — COMPREHENSIVE METABOLIC PANEL
ALT: 21 U/L (ref 0–44)
AST: 21 U/L (ref 15–41)
Albumin: 3.7 g/dL (ref 3.5–5.0)
Alkaline Phosphatase: 115 U/L (ref 38–126)
Anion gap: 7 (ref 5–15)
BUN: 19 mg/dL (ref 8–23)
CO2: 25 mmol/L (ref 22–32)
Calcium: 8.8 mg/dL — ABNORMAL LOW (ref 8.9–10.3)
Chloride: 105 mmol/L (ref 98–111)
Creatinine, Ser: 1.41 mg/dL — ABNORMAL HIGH (ref 0.61–1.24)
GFR, Estimated: 55 mL/min — ABNORMAL LOW (ref >60)
Glucose, Bld: 110 mg/dL — ABNORMAL HIGH (ref 70–99)
Potassium: 4.4 mmol/L (ref 3.5–5.1)
Sodium: 137 mmol/L (ref 135–145)
Total Bilirubin: 0.6 mg/dL (ref 0.3–1.2)
Total Protein: 6.9 g/dL (ref 6.5–8.1)

## 2021-05-03 LAB — RESP PANEL BY RT-PCR (FLU A&B, COVID) ARPGX2
Influenza A by PCR: NEGATIVE
Influenza B by PCR: NEGATIVE
SARS Coronavirus 2 by RT PCR: NEGATIVE

## 2021-05-03 LAB — TROPONIN I (HIGH SENSITIVITY)
Troponin I (High Sensitivity): 12 ng/L (ref <18)
Troponin I (High Sensitivity): 24 ng/L — ABNORMAL HIGH (ref <18)

## 2021-05-03 LAB — BRAIN NATRIURETIC PEPTIDE: B Natriuretic Peptide: 101 pg/mL — ABNORMAL HIGH (ref 0.0–100.0)

## 2021-05-03 MED ORDER — ALBUTEROL SULFATE HFA 108 (90 BASE) MCG/ACT IN AERS: 2.0000 | INHALATION_SPRAY | RESPIRATORY_TRACT | Status: DC | PRN

## 2021-05-03 MED ORDER — ASPIRIN 325 MG PO TABS: 325.0000 mg | ORAL_TABLET | Freq: Once | ORAL | Status: DC

## 2021-05-03 NOTE — ED Notes (Signed)
Pt states he wishes to leave. He states "yall haven't done a damn thing for me the whole time ive been here other than draw my blood and I know I can leave". Dr.Emokpae made aware. Pt agrees to stay until MD can come speak with him.

## 2021-05-03 NOTE — ED Provider Notes (Addendum)
[facility] EMERGENCY DEPARTMENT Provider Note   CSN: [id number] Arrival date & time: 05/03/21  7902     History Chief Complaint  Patient presents with  . Respiratory Distress    Joseph [name] is a 65 y.o. male.  Patient with hx chf, copd, c/o chest tightness and increased wheezing/sob this AM. Symptoms acute onset, tight, moderate, at rest, not pleuritic. States he takes torsemide 2x/week, and prn, and if has wt increase or sob he will take. Pt indicates this AM his bp was high, and felt he may have 'flash edema' so took torsemide and albuterol. States currently no chest pain or discomfort, and wheezing/sob also improved. Denies increased leg edema, no pnd, +orthopnea. No recent exertional cp or discomfort. Compliant w home meds. Occasional non prod cough. No sore throat. No fever or chills.   The history is provided by the patient and the EMS personnel.       Past Medical History:  Diagnosis Date  . CHF (congestive heart failure) (HCC)    a. EF 25-30% in 2016 b. 35-40% by echo in 10/2017 c. 35-40% in 08/2020  . COPD (chronic obstructive pulmonary disease) (HCC)   . Coronary artery disease    a. s/p CABG in 2000 b. patent grafts in 2011 c. 08/2020: repeat cath showing severe native CAD with patent bypass grafts --> medical therapy recommended  . GI bleed    Mallory-Weiss tear EGD 02/2010  . Hyperlipidemia   . Hypertension   . Ileus (HCC) 2006   required hospitalization  . Left ventricular dysfunction    apical thrombus  . MRSA bacteremia   . 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 Jude  . Small bowel obstruction (HCC) 2007   resolved without surgery    Patient Active Problem List   Diagnosis Date Noted  . Stage 3 chronic kidney disease (HCC) 08/11/2019  . Anemia 01/06/2019  . Heme + stool 09/10/2017  . Controlled diabetes mellitus with stage 3 chronic kidney disease (HCC) 08/15/2017  . Hypertension associated with  diabetes (HCC)   . Arteriosclerosis of coronary artery 08/05/2014  . Acid reflux 08/05/2014  . Hx of CABG 09/06/2011  . Chronic systolic heart failure (HCC) 05/02/2011  . LAD stenosis   . Coronary artery disease   . Automatic implantable cardioverter-defibrillator in situ 01/12/2010  . Other specified forms of chronic ischemic heart disease 11/03/2009  . Cardiomyopathy, ischemic 11/03/2009  . Hyperlipidemia associated with type 2 diabetes mellitus (HCC) 02/02/2009    Past Surgical History:  Procedure Laterality Date  . basilic vein left arm resection    . CARDIAC DEFIBRILLATOR PLACEMENT    . CORONARY ARTERY BYPASS GRAFT     6 vessels, 2000  . INSERT / REPLACE / REMOVE PACEMAKER  01/05/10   ICD insertion/St. Jude single chamber  . RIGHT/LEFT HEART CATH AND CORONARY/GRAFT ANGIOGRAPHY N/A 09/23/2020   Procedure: RIGHT/LEFT HEART CATH AND CORONARY/GRAFT ANGIOGRAPHY;  Surgeon: Tonny Bollman, MD;  Location: [facility] INVASIVE CV LAB;  Service: Cardiovascular;  Laterality: N/A;       Family History  Problem Relation Age of Onset  . Heart attack Mother   . Emphysema Father   . Coronary artery disease Father        s/p cabg  . Colon cancer Neg Hx     Social History   Tobacco Use  . Smoking status: Former Smoker    Types: Cigarettes    Start date: 11/28/1967    Quit date: 02/29/1988  Years since quitting: 33.1  . Smokeless tobacco: Never Used  Vaping Use  . Vaping Use: Never used  Substance Use Topics  . Alcohol use: No    Alcohol/week: 0.0 standard drinks  . Drug use: No    Home Medications Prior to Admission medications   Medication Sig Start Date End Date Taking? Authorizing Provider  acetaminophen (TYLENOL) 500 MG tablet Take 500 mg by mouth every 6 (six) hours as needed for pain.    [provider]  albuterol (VENTOLIN HFA) 108 (90 Base) MCG/ACT inhaler Inhale 2 puffs into the lungs every 4 (four) hours as needed for wheezing or shortness of breath. 09/08/20    Dione BoozeGlick, David, MD  aspirin EC 81 MG tablet Take 81 mg by mouth every evening. Swallow whole.    [provider]  atorvastatin (LIPITOR) 80 MG tablet Take 1 tablet (80 mg total) by mouth every evening. 11/29/20   [name], Elige RadonJoshua A, MD  empagliflozin (JARDIANCE) 10 MG TABS tablet Take 1 tablet (10 mg total) by mouth daily before breakfast. 12/30/20   Bensimhon, Bevelyn Bucklesaniel R, MD  enalapril (VASOTEC) 20 MG tablet Take 1 tablet (20 mg total) by mouth 2 (two) times daily. 04/20/21   Strader, Lennart PallBrittany M, PA-C  EPINEPHrine 0.3 mg/0.3 mL IJ SOAJ injection Inject into the muscle as directed. 01/27/21   [provider]  fish oil-omega-3 fatty acids 1000 MG capsule Take 1 g by mouth 3 (three) times daily.    [provider]  hydrOXYzine (VISTARIL) 25 MG capsule Take 1 capsule (25 mg total) by mouth 3 (three) times daily as needed. 09/13/20   [name], Elige RadonJoshua A, MD  metoprolol succinate (TOPROL-XL) 100 MG 24 hr tablet Take 1 tablet (100 mg total) by mouth daily. Take with or immediately following a meal. 09/14/20 12/13/20  Strader, Lennart PallBrittany M, PA-C  Multiple Vitamin (MULTIVITAMIN WITH MINERALS) TABS tablet Take 1 tablet by mouth daily.    [provider]  nitroGLYCERIN (NITROSTAT) 0.4 MG SL tablet Place 1 tablet (0.4 mg total) under the tongue every 5 (five) minutes as needed for chest pain. 05/24/16   Rollene RotundaHochrein, James, MD  omeprazole (PRILOSEC) 20 MG capsule TAKE 1 CAPSULE BY MOUTH EVERY DAY 05/26/20   [name], Elige RadonJoshua A, MD  spironolactone (ALDACTONE) 25 MG tablet Take 0.5 tablets (12.5 mg total) by mouth daily. 02/04/21 05/05/21  Clegg, Amy D, NP  torsemide (DEMADEX) 20 MG tablet Take 1 tablet (20 mg total) by mouth 2 (two) times a week. 03/17/21   Sherald Hesslegg, Amy D, NP    Allergies    Entresto [sacubitril-valsartan]  Review of Systems   Review of Systems  Constitutional: Negative for chills and fever.  HENT: Negative for sore throat.   Eyes: Negative for redness.  Respiratory: Positive  for cough and shortness of breath.   Cardiovascular: Positive for chest pain. Negative for palpitations and leg swelling.  Gastrointestinal: Negative for abdominal pain, nausea and vomiting.  Genitourinary: Negative for flank pain.  Musculoskeletal: Negative for back pain and neck pain.  Skin: Negative for rash.  Neurological: Negative for headaches.  Hematological: Does not bruise/bleed easily.  Psychiatric/Behavioral: Negative for confusion.    Physical Exam Updated Vital Signs BP (!) 156/95   Pulse 81   Temp 98.6 F (37 C) (Oral)   Resp (!) 22   Ht 1.803 m (5\' 11" )   Wt 82.1 kg   SpO2 99%   BMI 25.24 kg/m   Physical Exam Vitals and nursing note reviewed.  Constitutional:  Appearance: Normal appearance. He is well-developed.  HENT:     Head: Atraumatic.     Nose: Nose normal.     Mouth/Throat:     Mouth: Mucous membranes are moist.     Pharynx: Oropharynx is clear.  Eyes:     General: No scleral icterus.    Conjunctiva/sclera: Conjunctivae normal.  Neck:     Trachea: No tracheal deviation.  Cardiovascular:     Rate and Rhythm: Normal rate and regular rhythm.     Pulses: Normal pulses.     Heart sounds: Normal heart sounds. No murmur heard. No friction rub. No gallop.   Pulmonary:     Effort: Pulmonary effort is normal. No accessory muscle usage or respiratory distress.     Breath sounds: Wheezing present.     Comments: V mild wheezing.  Abdominal:     General: Bowel sounds are normal. There is no distension.     Palpations: Abdomen is soft.     Tenderness: There is no abdominal tenderness. There is no guarding.  Genitourinary:    Comments: No cva tenderness. Musculoskeletal:        General: No swelling or tenderness.     Cervical back: Normal range of motion and neck supple. No rigidity.     Right lower leg: No edema.     Left lower leg: No edema.  Skin:    General: Skin is warm and dry.     Findings: No rash.  Neurological:     Mental Status: He  is alert.     Comments: Alert, speech clear.   Psychiatric:        Mood and Affect: Mood normal.     ED Results / Procedures / Treatments   Labs (all labs ordered are listed, but only abnormal results are displayed) Results for orders placed or performed during the hospital encounter of 05/03/21  Resp Panel by RT-PCR (Flu A&B, Covid) Nasopharyngeal Swab   Specimen: Nasopharyngeal Swab; Nasopharyngeal(NP) swabs in vial transport medium  Result Value Ref Range   SARS Coronavirus 2 by RT PCR NEGATIVE NEGATIVE   Influenza A by PCR NEGATIVE NEGATIVE   Influenza B by PCR NEGATIVE NEGATIVE  Brain natriuretic peptide  Result Value Ref Range   B Natriuretic Peptide 101.0 (H) 0.0 - 100.0 pg/mL  CBC  Result Value Ref Range   WBC 10.1 4.0 - 10.5 K/uL   RBC 5.01 4.22 - 5.81 MIL/uL   Hemoglobin 14.8 13.0 - 17.0 g/dL   HCT 53.2 99.2 - 42.6 %   MCV 91.0 80.0 - 100.0 fL   MCH 29.5 26.0 - 34.0 pg   MCHC 32.5 30.0 - 36.0 g/dL   RDW 83.4 19.6 - 22.2 %   Platelets 225 150 - 400 K/uL   nRBC 0.0 0.0 - 0.2 %  Comprehensive metabolic panel  Result Value Ref Range   Sodium 137 135 - 145 mmol/L   Potassium 4.4 3.5 - 5.1 mmol/L   Chloride 105 98 - 111 mmol/L   CO2 25 22 - 32 mmol/L   Glucose, Bld 110 (H) 70 - 99 mg/dL   BUN 19 8 - 23 mg/dL   Creatinine, Ser 9.79 (H) 0.61 - 1.24 mg/dL   Calcium 8.8 (L) 8.9 - 10.3 mg/dL   Total Protein 6.9 6.5 - 8.1 g/dL   Albumin 3.7 3.5 - 5.0 g/dL   AST 21 15 - 41 U/L   ALT 21 0 - 44 U/L   Alkaline Phosphatase 115 38 -  126 U/L   Total Bilirubin 0.6 0.3 - 1.2 mg/dL   GFR, Estimated 55 (L) >60 mL/min   Anion gap 7 5 - 15  Troponin I (High Sensitivity)  Result Value Ref Range   Troponin I (High Sensitivity) 12 <18 ng/L  Troponin I (High Sensitivity)  Result Value Ref Range   Troponin I (High Sensitivity) 24 (H) <18 ng/L   CUP PACEART REMOTE DEVICE CHECK  Result Date: 04/04/2021 Scheduled remote reviewed. Normal device function.  RP Next remote 91  days.  EKG EKG Interpretation  Date/Time:  Tuesday May 03 2021 07:23:15 EDT Ventricular Rate:  81 PR Interval:  164 QRS Duration: 93 QT Interval:  372 QTC Calculation: 432 R Axis:   61 Text Interpretation: Sinus rhythm Ventricular premature complex Probable LVH with secondary repol abnrm Nonspecific ST abnormality Confirmed by Cathren Laine ([id number]) on 05/03/2021 7:53:26 AM   Radiology DG Chest Port 1 View  Result Date: 05/03/2021 CLINICAL DATA:  Chest tightness, shortness of breath EXAM: PORTABLE CHEST 1 VIEW COMPARISON:  12/18/2020 FINDINGS: Left AICD remains in place, unchanged. Prior CABG. Heart upper limits normal in size. No confluent opacities or effusions. No acute bony abnormality. IMPRESSION: No active disease. Electronically Signed   By: Charlett Nose M.D.   On: 05/03/2021 08:13    Procedures Procedures   Medications Ordered in ED Medications - No data to display  ED Course  I have reviewed the triage vital signs and the nursing notes.  Pertinent labs & imaging results that were available during my care of the patient were reviewed by me and considered in my medical decision making (see chart for details).    MDM Rules/Calculators/A&P                         Iv ns. Continuous pulse ox and cardiac monitoring. Stat labs. Imaging.  Pt evaluated promptly on arrival, and on that additional check/recheck notes symptoms have improved from prior. Labs are pending.   Reviewed nursing notes and prior charts for additional history.   Labs reviewed/interpreted by me - initial trop normal. Additional labs remain pending.   CXR reviewed/interpreted by me - no pna.   Additional labs reviewed/interpreted by me - delta trop is increased from prior, mildly high, given patients hx cad/cabg, etc - returned to room as soon as I was available after patients delta trop resulted - recommended admission.   Given hx cad, chest tightness, mild bump in troponin - will consult hospitalist for  admission. Discussed w pt - pt appears uncertain as to whether willing to stay for admission. In interim, hospitalist had come to ED very promptly (communication/timing issue attempted to be explained to patient).  Pt became very upset as indicated he had not decided he was willing to stay for admission.  Hospitalist and I had discussed rationale for admission, bump in trop from initial, hx cad, etc, and risk of MI.  Patient voiced his understanding, and now is adamant that he wont be staying for admission - patient becoming very aggressive, raising voice, calling names and using disrespecttful speech/words, threatening to call police, etc.  Service recovery attempted - informed that our consistent recommendation was that he stay for obs/admission, but that we do not keep patients against their will, and that he is able to leave AMA if he so desires. Pt continues to be verbally aggressive and requests to leave.   Will sign out AMA, to return if reconsiders and wishes to  be admitted and return precautions also provided.      Final Clinical Impression(s) / ED Diagnoses Final diagnoses:  None    Rx / DC Orders ED Discharge Orders    None         Cathren Laine, MD 05/03/21 1424

## 2021-05-03 NOTE — Discharge Instructions (Addendum)
It was our pleasure to provide your ER care today - we hope that you feel better.  Please return if re-consider and want to be admitted.   Continue your meds, follow low salt, heart healthy meal plan.   Follow up with your cardiologist in the coming week - call office to arrange appointment.   Return to ER right away if worse, new symptoms, increased trouble breathing, persistent or recurrent chest pain, fevers, weak/fainting, or other concern.

## 2021-05-03 NOTE — ED Notes (Signed)
Patient ambulatory to the bathroom.

## 2021-05-03 NOTE — ED Triage Notes (Signed)
Per EMS patient started having chest tightness followed by shortness of breath so patient took breathing treatment and extra torsemide for flash pulmonary edema. Patient has hx of same.

## 2021-05-03 NOTE — ED Notes (Signed)
Patient is resting comfortably. 

## 2021-05-03 NOTE — ED Notes (Signed)
Pt refused ECHO MD made aware

## 2021-05-03 NOTE — Consult Note (Signed)
Patient Demographics:    Joseph [name], is a 65 y.o. male  MRN: [id number]   DOB - 1956-05-25  Admit Date - 05/03/2021  Outpatient Primary MD for the patient is [name], Elige Radon, MD   Assessment & Plan:    Principal Problem:   Dyspnea, unspecified Active Problems:   Cardiomyopathy, ischemic   Coronary artery disease   Hx of CABG   Hyperlipidemia associated with type 2 diabetes mellitus (HCC)   Automatic implantable cardioverter-defibrillator in situ   Controlled diabetes mellitus with stage 3 chronic kidney disease (HCC)   1)H/o ofCAD (s/p CABG in 2000, NST in 05/2008 showing scar with no ischemia, patent grafts on LHC from 09/23/2010)-- presenting with dyspnea --??  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 Jude ICD placement in 2011)--- -presenting with shortness of breath, BNP and chest x-ray without acute findings -Patient refuses echo -Denies FRank Chest pains, No leg pains , no pleuritic symptoms, no tachycardia---low index of suspicion for possible PE -Patient refuses further evaluation in the hospital at this time -Patient and daughter Tresa Endo requesting discharge home with outpatient follow-up with his cardiology  3)CKD 3A--creatinine 1.41 which is around patient's baseline  4)HTN--BP is elevated as patient is somewhat anxious and  angry, patient declines further interventions and wants to go home  5)DM2-- A1c is 5.6, reflecting excellent diabetic control PTA Diet controlled    Disposition----patient and daughter Tresa Endo declined further interview, declined further exam, and declined further investigations, refused echocardiogram, requested discharge home discussed with EDP who will discharge patient home    With History of - Reviewed by me  Past Medical History:  Diagnosis Date  . CHF (congestive heart failure) (HCC)    a. EF 25-30% in 2016 b. 35-40% by echo in 10/2017 c. 35-40% in 08/2020  . COPD (chronic obstructive pulmonary disease) (HCC)   . Coronary artery disease    a. s/p CABG in 2000 b. patent grafts in 2011 c. 08/2020: repeat cath showing severe native CAD with patent bypass grafts --> medical therapy recommended  . GI bleed    Mallory-Weiss tear EGD 02/2010  . Hyperlipidemia   . Hypertension   . Ileus (HCC) 2006   required hospitalization  . Left ventricular dysfunction    apical thrombus  . MRSA bacteremia   . 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 Jude  . Small bowel obstruction (HCC) 2007   resolved without surgery      Past Surgical History:  Procedure Laterality Date  .  basilic vein left arm resection    . CARDIAC DEFIBRILLATOR PLACEMENT    . CORONARY ARTERY BYPASS GRAFT     6 vessels, 2000  . INSERT / REPLACE / REMOVE PACEMAKER  01/05/10   ICD insertion/St. Jude single chamber  . RIGHT/LEFT HEART CATH AND CORONARY/GRAFT ANGIOGRAPHY N/A 09/23/2020   Procedure: RIGHT/LEFT HEART CATH AND CORONARY/GRAFT ANGIOGRAPHY;  Surgeon: Tonny Bollman, MD;  Location: [facility] INVASIVE CV LAB;  Service: Cardiovascular;  Laterality: N/A;      Chief Complaint  Patient presents with  . Respiratory Distress      HPI:    Joseph [name]  is a 65 y.o. 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 Jude ICD placement in 2011-- Now  presenting with dyspnea --??  Chest discomfort- pt denies Frank Chest Pains, says just shob -EKG is sinus rhythm, no significant ischemic changes -Chest x-ray without acute cardiopulmonary findings Troponin 12 >> 24, BNP 101.0,,cr 1.41 Covid is Negative -Patient declines further investigations in the hospital at this time -Insisting on going home from the ED, his daughter Tresa Endo concurs -Cbc WNL-- -Patient refuses echo -Denies FRank Chest pains, No leg pains , no pleuritic symptoms, no tachycardia---low index of suspicion for possible PE -Patient refuses further evaluation in the hospital at this time -Patient and daughter Tresa Endo requesting discharge home with outpatient follow-up with his cardiology  -patient and daughter Tresa Endo declined further interview, declined further exam, and declined further investigations, refused echocardiogram, requested discharge home discussed with EDP who will discharge patient home    Review of systems:    In addition to the HPI above,   A full Review of  Systems was done, all other systems reviewed are negative except as noted above in HPI , .    Social History:  Reviewed by me    Social History   Tobacco Use  . Smoking status: Former Smoker    Types: Cigarettes    Start date: 11/28/1967    Quit date: 02/29/1988    Years since quitting: 33.1  . Smokeless tobacco: Never Used  Substance Use Topics  . Alcohol use: No    Alcohol/week: 0.0 standard drinks     Family History :  Reviewed by me    Family History  Problem Relation Age of Onset  . Heart attack Mother   . Emphysema Father   . Coronary artery disease Father        s/p cabg  . Colon cancer Neg Hx      Home Medications:   Prior to Admission medications   Medication Sig  Start Date End Date Taking? Authorizing Provider  acetaminophen (TYLENOL) 500 MG tablet Take 500 mg by mouth every 6 (six) hours as needed for pain.   Yes [provider]  albuterol (VENTOLIN HFA) 108 (90 Base) MCG/ACT inhaler Inhale 2 puffs into the lungs every 4 (four) hours as needed for wheezing or shortness of breath. 09/08/20  Yes Dione Booze, MD  aspirin EC 81 MG tablet Take 81 mg by mouth every evening. Swallow whole.   Yes [provider]  atorvastatin (LIPITOR) 80 MG tablet Take 1 tablet (80 mg total) by mouth every evening. 11/29/20  Yes [name], Elige Radon, MD  empagliflozin (JARDIANCE) 10 MG TABS tablet Take  1 tablet (10 mg total) by mouth daily before breakfast. 12/30/20  Yes Bensimhon, Bevelyn Buckles, MD  enalapril (VASOTEC) 20 MG tablet Take 1 tablet (20 mg total) by mouth 2 (two) times daily. 04/20/21  Yes Strader, [place] M, PA-C  fish oil-omega-3 fatty acids 1000 MG capsule Take 1 g by mouth 3 (three) times daily.   Yes [provider]  hydrOXYzine (VISTARIL) 25 MG capsule Take 1 capsule (25 mg total) by mouth 3 (three) times daily as needed. Patient taking differently: Take 25 mg by mouth 3 (three) times daily as needed for anxiety. 09/13/20  Yes [name], Elige Radon, MD  metoprolol succinate (TOPROL-XL) 100 MG 24 hr tablet Take 1 tablet (100 mg total) by mouth daily. Take with or immediately following a meal. 09/14/20 12/13/20 Yes Strader, Lennart Pall, PA-C  Multiple Vitamin (MULTIVITAMIN WITH MINERALS) TABS tablet Take 1 tablet by mouth daily.   Yes [provider]  omeprazole (PRILOSEC) 20 MG capsule TAKE 1 CAPSULE BY MOUTH EVERY DAY Patient taking differently: Take 20 mg by mouth daily. 05/26/20  Yes [name], Elige Radon, MD  spironolactone (ALDACTONE) 25 MG tablet Take 0.5 tablets (12.5 mg total) by mouth daily. 02/04/21 05/05/21 Yes Clegg, Amy D, NP  torsemide (DEMADEX) 20 MG tablet Take 1 tablet (20 mg total) by mouth 2 (two) times a week. Patient taking  differently: Take 20 mg by mouth 2 (two) times a week. If weight is over 177 take and extra tablet 03/17/21  Yes Clegg, Amy D, NP  EPINEPHrine 0.3 mg/0.3 mL IJ SOAJ injection Inject 0.3 mg into the muscle as needed for anaphylaxis. 01/27/21   [provider]  nitroGLYCERIN (NITROSTAT) 0.4 MG SL tablet Place 1 tablet (0.4 mg total) under the tongue every 5 (five) minutes as needed for chest pain. 05/24/16   Rollene Rotunda, MD     Allergies:     Allergies  Allergen Reactions  . Entresto [Sacubitril-Valsartan]     Worsening renal function and weight gain     Physical Exam:   Vitals  Blood pressure 101/65, pulse 72, temperature 98.6 F (37 C), temperature source Oral, resp. rate 20, height 5\' 11"  (1.803 m), weight 82.1 kg, SpO2 96 %.  Physical Examination: General appearance - alert, and in no distress   Mental status - alert, oriented to person, place, and time, patient is upset about EDP requesting admission, he does not want to be admitted to the hospital he wants to go home now Eyes - sclera anicteric Neck -  no JVD elevation , Heart -  regular  Neurological - screening mental status exam normal, moving all extremities Extremities - no pedal edema noted,  -Exam is limited as patient does not really want to be here he wants to go home -    Data Review:    CBC Recent Labs  Lab 05/03/21 0759  WBC 10.1  HGB 14.8  HCT 45.6  PLT 225  MCV 91.0  MCH 29.5  MCHC 32.5  RDW 13.7   ------------------------------------------------------------------------------------------------------------------  Chemistries  Recent Labs  Lab 04/28/21 1134 05/03/21 0759  NA 135 137  K 5.1 4.4  CL 101 105  CO2 27 25  GLUCOSE 96 110*  BUN 27* 19  CREATININE 1.45* 1.41*  CALCIUM 9.1 8.8*  AST  --  21  ALT  --  21  ALKPHOS  --  115  BILITOT  --  0.6   ------------------------------------------------------------------------------------------------------------------ estimated  creatinine clearance is 54.1 mL/min (A) (by C-G formula based  on SCr of 1.41 mg/dL (H)). ------------------------------------------------------------------------------------------------------------------ No results for input(s): TSH, T4TOTAL, T3FREE, THYROIDAB in the last 72 hours.  Invalid input(s): FREET3   Coagulation profile No results for input(s): INR, PROTIME in the last 168 hours. ------------------------------------------------------------------------------------------------------------------- No results for input(s): DDIMER in the last 72 hours. -------------------------------------------------------------------------------------------------------------------  Cardiac Enzymes No results for input(s): CKMB, TROPONINI, MYOGLOBIN in the last 168 hours.  Invalid input(s): CK ------------------------------------------------------------------------------------------------------------------    Component Value Date/Time   BNP 101.0 (H) 05/03/2021 0759     ---------------------------------------------------------------------------------------------------------------  Urinalysis    Component Value Date/Time   COLORURINE YELLOW 03/12/2010 1655   APPEARANCEUR CLEAR 03/12/2010 1655   LABSPEC 1.010 03/12/2010 1655   PHURINE 6.0 03/12/2010 1655   GLUCOSEU NEGATIVE 03/12/2010 1655   HGBUR MODERATE (A) 03/12/2010 1655   BILIRUBINUR NEGATIVE 03/12/2010 1655   KETONESUR TRACE (A) 03/12/2010 1655   PROTEINUR 30 (A) 03/12/2010 1655   UROBILINOGEN 1.0 03/12/2010 1655   NITRITE NEGATIVE 03/12/2010 1655   LEUKOCYTESUR NEGATIVE 03/12/2010 1655    ----------------------------------------------------------------------------------------------------------------   Imaging Results:    DG Chest Port 1 View  Result Date: 05/03/2021 CLINICAL DATA:  Chest tightness, shortness of breath EXAM: PORTABLE CHEST 1 VIEW COMPARISON:  12/18/2020 FINDINGS: Left AICD remains in place, unchanged. Prior  CABG. Heart upper limits normal in size. No confluent opacities or effusions. No acute bony abnormality. IMPRESSION: No active disease. Electronically Signed   By: Charlett Nose M.D.   On: 05/03/2021 08:13    Radiological Exams on Admission: DG Chest Port 1 View  Result Date: 05/03/2021 CLINICAL DATA:  Chest tightness, shortness of breath EXAM: PORTABLE CHEST 1 VIEW COMPARISON:  12/18/2020 FINDINGS: Left AICD remains in place, unchanged. Prior CABG. Heart upper limits normal in size. No confluent opacities or effusions. No acute bony abnormality. IMPRESSION: No active disease. Electronically Signed   By: Charlett Nose M.D.   On: 05/03/2021 08:13    Family Communication: Admission, patients condition and plan of care including tests being ordered have been discussed with the patient and daughter Tresa Endo who indicate understanding and agree with the plan   Code Status - Full Code  Likely DC to  --patient and daughter Tresa Endo declined further interview, declined further exam, and declined further investigations, refused echocardiogram, requested discharge home discussed with EDP who will discharge patient home  Condition   stable  Shon Hale M.D on 05/03/2021 at 2:09 PM Go to [email or web address] -  for contact info  [facility] - Office  [phone]

## 2021-05-03 NOTE — Progress Notes (Signed)
  Echocardiogram 2D Echocardiogram has been attempted. Patient refused Exam.  Dorena Dew Dior Dominik 05/03/2021, 1:55 PM

## 2021-05-03 NOTE — ED Notes (Signed)
Provider at bedside for MSE that patient verbally agreed.

## 2021-05-05 ENCOUNTER — Encounter: Payer: Self-pay | Admitting: Student

## 2021-05-05 ENCOUNTER — Other Ambulatory Visit: Payer: Self-pay

## 2021-05-05 ENCOUNTER — Ambulatory Visit (INDEPENDENT_AMBULATORY_CARE_PROVIDER_SITE_OTHER): Payer: Medicare HMO | Admitting: Student

## 2021-05-05 VITALS — BP 136/82 | HR 84 | Ht 71.0 in | Wt 180.0 lb

## 2021-05-05 DIAGNOSIS — I2510 Atherosclerotic heart disease of native coronary artery without angina pectoris: Secondary | ICD-10-CM | POA: Diagnosis not present

## 2021-05-05 DIAGNOSIS — Z79899 Other long term (current) drug therapy: Secondary | ICD-10-CM | POA: Diagnosis not present

## 2021-05-05 DIAGNOSIS — I10 Essential (primary) hypertension: Secondary | ICD-10-CM

## 2021-05-05 DIAGNOSIS — E785 Hyperlipidemia, unspecified: Secondary | ICD-10-CM | POA: Diagnosis not present

## 2021-05-05 DIAGNOSIS — I5042 Chronic combined systolic (congestive) and diastolic (congestive) heart failure: Secondary | ICD-10-CM | POA: Diagnosis not present

## 2021-05-05 DIAGNOSIS — N1832 Chronic kidney disease, stage 3b: Secondary | ICD-10-CM | POA: Diagnosis not present

## 2021-05-05 MED ORDER — TORSEMIDE 20 MG PO TABS: 20.0000 mg | ORAL_TABLET | ORAL | 3 refills | Status: DC

## 2021-05-05 NOTE — Progress Notes (Signed)
Cardiology Office Note    Date:  05/05/2021   ID:  Amr, Sturtevant 1956-09-27, MRN [id number]  PCP:  [name], Elige Radon, MD  Cardiologist: Rollene Rotunda, MD --> Patient has requested to follow-up with Dr. Diona Browner AHF: Dr. Gala Romney  Chief Complaint  Patient presents with   Follow-up    Recent Emergency Dept visit    History of Present Illness:    Joseph [name] is a 65 y.o. male with past medical history of CAD (s/p CABG in 2000, patent grafts by cath in 2011 and patent grafts by repeat cath in 08/2020), chronic combined systolic and diastolic CHF (EF 32-99% in 2016, at 35-40% by echo in 10/2017, 30-35% in 01/2019, and 35-40% by echo in 08/2020, s/p St. Jude ICD placement in 2011), HTN, HLD, Type 2 DM (diet-controlled) and Stage 3 CKD who presents to the office today for follow-up from a recent Emergency Department visit.  He was recently examined myself on 04/20/2021 and denied any evidence of volume overload at that time. His weight had overall been stable on his home scales he was taking Torsemide 20 mg approximately twice a week and an extra tablet if his weight went above 177 lbs. He did follow-up with Advanced Heart Failure on 04/28/2021 and was still doing well at that time and denied any recent symptoms. His volume status was stable by Reds Vest assessment and he was continued on his current medication regimen.  In the interim, he presented to [facility] ED on 05/03/2021 for evaluation of dyspnea. Hs Troponin values were at 12 and 24. BNP at 101. Electrolytes WNL and creatinine at 1.41. COVID negative. CXR showed no acute findings. EKG showed NSR, HR 81 with PVC's and LVH with associated repol abnormalities, overall similar to prior tracings. Was evaluated by the Hospitalist for possible admission but declined and requested discharge home.  In talking with the patient today, he reports his weight increased by 4 lbs from Monday to Tuesday and in reviewing his dietary intake, he had  consumed chili and chicken nuggets from [facility] for dinner (over 1600mg  of sodium by review of the nutrition information). His dyspnea started acutely Tuesday morning and he could feel a "rattle" in his chest. Notes mentioned chest pressure but he denies any chest pain with the episode. No specific abdominal distension or lower extremity edema. He has been doing well since returning home and his weight was down to 176 lbs on his home scales today (up to 181.4 lbs on Tuesday). No reported orthopnea or PND.    Past Medical History:  Diagnosis Date   CHF (congestive heart failure) (HCC)    a. EF 25-30% in 2016 b. 35-40% by echo in 10/2017 c. 35-40% in 08/2020   COPD (chronic obstructive pulmonary disease) (HCC)    Coronary artery disease    a. s/p CABG in 2000 b. patent grafts in 2011 c. 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 Jude   Small bowel obstruction (HCC) 2007   resolved without surgery    Past Surgical History:  Procedure Laterality Date   basilic vein left arm resection  CARDIAC DEFIBRILLATOR PLACEMENT     CORONARY ARTERY BYPASS GRAFT     6 vessels, 2000   INSERT / REPLACE / REMOVE PACEMAKER  01/05/10   ICD insertion/St. Jude single chamber   RIGHT/LEFT HEART CATH AND CORONARY/GRAFT ANGIOGRAPHY N/A 09/23/2020   Procedure: RIGHT/LEFT HEART CATH AND CORONARY/GRAFT ANGIOGRAPHY;  Surgeon: Tonny Bollmanooper, Michael, MD;  [facility]VASIVE [facility] LAB;  Service: Cardiovascular;  Laterality: N/A;    Current Medications: Outpatient Medications Prior to Visit  Medication Sig Dispense Refill   acetaminophen (TYLENOL) 500 MG tablet Take 500 mg by  mouth every 6 (six) hours as needed for pain.     albuterol (VENTOLIN HFA) 108 (90 Base) MCG/ACT inhaler Inhale 2 puffs into the lungs every 4 (four) hours as needed for wheezing or shortness of breath. 6.7 g 0   aspirin EC 81 MG tablet Take 81 mg by mouth every evening. Swallow whole.     atorvastatin (LIPITOR) 80 MG tablet Take 1 tablet (80 mg total) by mouth every evening. 90 tablet 3   empagliflozin (JARDIANCE) 10 MG TABS tablet Take 1 tablet (10 mg total) by mouth daily before breakfast. 30 tablet 6   enalapril (VASOTEC) 20 MG tablet Take 1 tablet (20 mg total) by mouth 2 (two) times daily. 180 tablet 3   EPINEPHrine 0.3 mg/0.3 mL IJ SOAJ injection Inject 0.3 mg into the muscle as needed for anaphylaxis.     fish oil-omega-3 fatty acids 1000 MG capsule Take 1 g by mouth 3 (three) times daily.     hydrOXYzine (VISTARIL) 25 MG capsule Take 1 capsule (25 mg total) by mouth 3 (three) times daily as needed. (Patient taking differently: Take 25 mg by mouth 3 (three) times daily as needed for anxiety.) 30 capsule 0   metoprolol succinate (TOPROL-XL) 100 MG 24 hr tablet Take 1 tablet (100 mg total) by mouth daily. Take with or immediately following a meal. 90 tablet 3   Multiple Vitamin (MULTIVITAMIN WITH MINERALS) TABS tablet Take 1 tablet by mouth daily.     nitroGLYCERIN (NITROSTAT) 0.4 MG SL tablet Place 1 tablet (0.4 mg total) under the tongue every 5 (five) minutes as needed for chest pain. 25 tablet 3   omeprazole (PRILOSEC) 20 MG capsule TAKE 1 CAPSULE BY MOUTH EVERY DAY (Patient taking differently: Take 20 mg by mouth daily.) 90 capsule 3   spironolactone (ALDACTONE) 25 MG tablet Take 0.5 tablets (12.5 mg total) by mouth daily. 45 tablet 3   torsemide (DEMADEX) 20 MG tablet Take 1 tablet (20 mg total) by mouth 2 (two) times a week. (Patient taking differently: Take 20 mg by mouth 2 (two) times a week. If weight is over 177 take and extra tablet) 30 tablet 3   No facility-administered  medications prior to visit.     Allergies:   Entresto [sacubitril-valsartan]   Social History   Socioeconomic History   Marital status: Married    Spouse name: Kathie RhodesBetty   Number of children: 3   Years of education: GED   Highest education level: Not on file  Occupational History   Occupation: Runner, broadcasting/film/videowaste management    Comment: 23 years  Tobacco Use   Smoking status: Former    Pack years: 0.00    Types: Cigarettes    Start date: 11/28/1967    Quit date: 02/29/1988    Years since quitting: 33.2   Smokeless tobacco: Never  Vaping Use   Vaping Use: Never used  Substance and Sexual Activity   Alcohol use:  No    Alcohol/week: 0.0 standard drinks   Drug use: No   Sexual activity: Never  Other Topics Concern   Not on file  Social History Narrative   1 son died age 60-Lee's syndrome         Social Determinants of Health   Financial Resource Strain: Not on file  Food Insecurity: Not on file  Transportation Needs: Not on file  Physical Activity: Not on file  Stress: Not on file  Social Connections: Not on file     Family History:  The patient's family history includes Coronary artery disease in his father; Emphysema in his father; Heart attack in his mother.   Review of Systems:    Please see the history of present illness.     All other systems reviewed and are otherwise negative except as noted above.   Physical Exam:    VS:  BP 136/82   Pulse 84   Ht 5\' 11"  (1.803 m)   Wt 180 lb (81.6 kg)   SpO2 96%   BMI 25.10 kg/m    General: Well developed, well nourished,male appearing in no acute distress. Head: Normocephalic, atraumatic. Neck: No carotid bruits. JVD not elevated.  Lungs: Respirations regular and unlabored, without wheezes or rales.  Heart: Regular rate and rhythm. No S3 or S4.  No murmur, no rubs, or gallops appreciated. Abdomen: Appears non-distended. No obvious abdominal masses. Msk:  Strength and tone appear normal for age. No obvious joint deformities or  effusions. Extremities: No clubbing or cyanosis. Trace lower extremity edema bilaterally.  Distal pedal pulses are 2+ bilaterally. Neuro: Alert and oriented X 3. Moves all extremities spontaneously. No focal deficits noted. Psych:  Responds to questions appropriately with a normal affect. Skin: No rashes or lesions noted  Wt Readings from Last 3 Encounters:  05/05/21 180 lb (81.6 kg)  05/03/21 181 lb (82.1 kg)  04/28/21 183 lb (83 kg)     Studies/Labs Reviewed:   EKG:  EKG is not ordered today. EKG from 05/03/2021 is reviewed and shows NSR, HR 81 with PVC's and LVH with associated repol abnormalities, overall similar to prior tracings.  Recent Labs: 05/03/2021: ALT 21; B Natriuretic Peptide 101.0; BUN 19; Creatinine, Ser 1.41; Hemoglobin 14.8; Platelets 225; Potassium 4.4; Sodium 137   Lipid Panel    Component Value Date/Time   CHOL 140 03/10/2021 1058   TRIG 65 03/10/2021 1058   HDL 46 03/10/2021 1058   CHOLHDL 3.0 03/10/2021 1058   CHOLHDL 3.2 03/08/2010 0411   VLDL 10 03/08/2010 0411   LDLCALC 81 03/10/2021 1058    Additional studies/ records that were reviewed today include:   Echocardiogram: 08/2020 Summary    1. The left ventricle is mildly dilated in size with mildly increased wall  thickness.    2. The left ventricular systolic function is moderately decreased, LVEF is  visually estimated at 35-40%.    3. There is grade I diastolic dysfunction (impaired relaxation).    4. There is mild mitral valve regurgitation.    5. The left atrium is mildly to moderately dilated in size.    6. The right ventricle is normal in size, with normal systolic function.   Cardiac Catheterization: 08/2020 1.  Severe native three-vessel coronary artery disease with total occlusion of the mid LAD, mid circumflex, and distal RCA 2.  Status post aortocoronary bypass surgery with continued patency of the LIMA to LAD, sequential saphenous vein graft to OM1 and OM 2, saphenous vein graft to  diagonal 1, and saphenous vein graft to PDA 3.  Diffuse mid and distal LAD stenosis beyond the LIMA insertion site, progressive from the previous catheterization 10 years ago 4.  Essentially normal right heart hemodynamics with normal filling pressures and preserved cardiac output   Recommendations: Medical therapy for heart failure and coronary artery disease.  I do not appreciate any targets for PCI.  The patient's bypass grafts all remain patent.   Assessment:    1. Coronary artery disease involving native coronary artery of native heart without angina pectoris   2. Chronic combined systolic and diastolic heart failure (HCC)   3. Medication management   4. Essential hypertension   5. Hyperlipidemia LDL goal <70   6. Stage 3b chronic kidney disease (HCC)      Plan:   In order of problems listed above:  1. CAD - He is s/p CABG in 2000 and most recent cardiac catheterization in 08/2020 showed patent grafts. His Hs Troponin values were mildly elevated during his ED evaluation and suspect this was secondary to demand ischemia in the setting of CHF. He denies any recent anginal symptoms.  - Continue with medical therapy of ASA 81mg  daily, Atorvastatin 80mg  daily and Toprol-XL 100mg  daily.   2. HFrEF - His EF was at 35-40% by echo in 08/2020 and he has a St. Jude ICD in place which is followed by Dr. Ladona Ridgel and was functioning normally by device check in 03/2021. - We reviewed the importance of compliance with a low-sodium diet as he is very salt-sensitive and recently experienced a 4 lb weight gain after consuming fast food.  - He is currently taking Torsemide 20mg  twice weekly and I have recommended he titrate this to 20mg  every other day. Will recheck a BMET in 2 weeks.  - Continue Enalapril 20mg  BID (previously intolerant to Entresto), Toprol-XL 100mg  daily, Spironolactone 12.5mg  daily and Jardiance 10mg  daily. I did not further titrate Spironolactone today as his K+ was at 5.1 earlier  this month and he is not on K+ supplementation.   3. HTN - BP is well-controlled at 136/82 during today's visit. Continue current medication regimen.   4. HLD - LDL was at 81 in 02/2021. He is trying to make dietary changes so would focus on this and continue Atorvastatin 80mg  daily and Fish Oil. If this stays above goal, would switch Atorvastatin to Crestor or add Zetia. PCSK-9 Inhibitor would be an option as well but would need to check on coverage as he has expressed difficulty affording Jardiance and Social Work is helping with patient assistance for this.   5. Stage 3 CKD - His creatinine was stable at 1.41 by recent labs. Repeat BMET in 2 weeks following dose adjustment of Torsemide. He is followed by Dr. Wolfgang Phoenix and has scheduled follow-up in 05/2021.   Medication Adjustments/Labs and Tests Ordered: Current medicines are reviewed at length with the patient today.  Concerns regarding medicines are outlined above.  Medication changes, Labs and Tests ordered today are listed in the Patient Instructions below. Patient Instructions  Medication Instructions:  Your physician has recommended you make the following change in your medication:   Increase Torsemide to 20 mg Every Other Day   *If you need a refill on your cardiac medications before your next appointment, please call your pharmacy*   Lab Work: Your physician recommends that you return for lab work in: 2 Weeks (05/19/21)   If you have labs (blood work) drawn today and your tests are completely normal,  you will receive your results only by: MyChart Message (if you have MyChart) OR A paper copy in the mail If you have any lab test that is abnormal or we need to change your treatment, we will call you to review the results.   Testing/Procedures: NONE    Follow-Up: At [facility], you and your health needs are our priority.  As part of our continuing mission to provide you with exceptional heart care, we have created  designated Provider Care Teams.  These Care Teams include your primary Cardiologist (physician) and Advanced Practice Providers (APPs -  Physician Assistants and Nurse Practitioners) who all work together to provide you with the care you need, when you need it.  We recommend signing up for the patient portal called "MyChart".  Sign up information is provided on this After Visit Summary.  MyChart is used to connect with patients for Virtual Visits (Telemedicine).  Patients are able to view lab/test results, encounter notes, upcoming appointments, etc.  Non-urgent messages can be sent to your provider as well.   To learn more about what you can do with MyChart, go to [email or web address].    Your next appointment:   2 month(s)  The format for your next appointment:   In Person  Provider:   Randall An, PA-C   Other Instructions Thank you for choosing [facility]!    Two Gram Sodium Diet 2000 mg  What is Sodium? Sodium is a mineral found naturally in many foods. The most significant source of sodium in the diet is table salt, which is about 40% sodium.  Processed, convenience, and preserved foods also contain a large amount of sodium.  The body needs only 500 mg of sodium daily to function,  A normal diet provides more than enough sodium even if you do not use salt.  Why Limit Sodium? A build up of sodium in the body can cause thirst, increased blood pressure, shortness of breath, and water retention.  Decreasing sodium in the diet can reduce edema and risk of heart attack or stroke associated with high blood pressure.  Keep in mind that there are many other factors involved in these health problems.  Heredity, obesity, lack of exercise, cigarette smoking, stress and what you eat all play a role.  General Guidelines: Do not add salt at the table or in cooking.  One teaspoon of salt contains over 2 grams of sodium. Read food labels Avoid processed and convenience foods Ask  your dietitian before eating any foods not dicussed in the menu planning guidelines Consult your physician if you wish to use a salt substitute or a sodium containing medication such as antacids.  Limit milk and milk products to 16 oz (2 cups) per day.  Shopping Hints: READ LABELS!! "Dietetic" does not necessarily mean low sodium. Salt and other sodium ingredients are often added to foods during processing.    Menu Planning Guidelines Food Group Choose More Often Avoid  Beverages (see also the milk group All fruit juices, low-sodium, salt-free vegetables juices, low-sodium carbonated beverages Regular vegetable or tomato juices, commercially softened water used for drinking or cooking  Breads and Cereals Enriched white, wheat, rye and pumpernickel bread, hard rolls and dinner rolls; muffins, cornbread and waffles; most dry cereals, cooked cereal without added salt; unsalted crackers and breadsticks; low sodium or homemade bread crumbs Bread, rolls and crackers with salted tops; quick breads; instant hot cereals; pancakes; commercial bread stuffing; self-rising flower and biscuit mixes; regular bread crumbs or cracker  crumbs  Desserts and Sweets Desserts and sweets mad with mild should be within allowance Instant pudding mixes and cake mixes  Fats Butter or margarine; vegetable oils; unsalted salad dressings, regular salad dressings limited to 1 Tbs; light, sour and heavy cream Regular salad dressings containing bacon fat, bacon bits, and salt pork; snack dips made with instant soup mixes or processed cheese; salted nuts  Fruits Most fresh, frozen and canned fruits Fruits processed with salt or sodium-containing ingredient (some dried fruits are processed with sodium sulfites        Vegetables Fresh, frozen vegetables and low- sodium canned vegetables Regular canned vegetables, sauerkraut, pickled vegetables, and others prepared in brine; frozen vegetables in sauces; vegetables seasoned with  ham, bacon or salt pork  Condiments, Sauces, Miscellaneous  Salt substitute with physician's approval; pepper, herbs, spices; vinegar, lemon or lime juice; hot pepper sauce; garlic powder, onion powder, low sodium soy sauce (1 Tbs.); low sodium condiments (ketchup, chili sauce, mustard) in limited amounts (1 tsp.) fresh ground horseradish; unsalted tortilla chips, pretzels, potato chips, popcorn, salsa (1/4 cup) Any seasoning made with salt including garlic salt, celery salt, onion salt, and seasoned salt; sea salt, rock salt, kosher salt; meat tenderizers; monosodium glutamate; mustard, regular soy sauce, barbecue, sauce, chili sauce, teriyaki sauce, steak sauce, Worcestershire sauce, and most flavored vinegars; canned gravy and mixes; regular condiments; salted snack foods, olives, picles, relish, horseradish sauce, catsup   Food preparation: Try these seasonings Meats:    Pork Sage, onion Serve with applesauce  Chicken Poultry seasoning, thyme, parsley Serve with cranberry sauce  Lamb Curry powder, rosemary, garlic, thyme Serve with mint sauce or jelly  Veal Marjoram, basil Serve with current jelly, cranberry sauce  Beef Pepper, bay leaf Serve with dry mustard, unsalted chive butter  Fish Bay leaf, dill Serve with unsalted lemon butter, unsalted parsley butter  Vegetables:    Asparagus Lemon juice   Broccoli Lemon juice   Carrots Mustard dressing parsley, mint, nutmeg, glazed with unsalted butter and sugar   Green beans Marjoram, lemon juice, nutmeg,dill seed   Tomatoes Basil, marjoram, onion   Spice /blend for [employer]" 4 tsp ground thyme 1 tsp ground sage 3 tsp ground rosemary 4 tsp ground marjoram     Signed, Ellsworth Lennox, PA-C  05/05/2021 7:28 PM    [facility] [address] [place], [place] [address] Phone: [phone] Fax: [phone]

## 2021-05-05 NOTE — Patient Instructions (Addendum)
Medication Instructions:  Your physician has recommended you make the following change in your medication:   Increase Torsemide to 20 mg Every Other Day   *If you need a refill on your cardiac medications before your next appointment, please call your pharmacy*   Lab Work: Your physician recommends that you return for lab work in: 2 Weeks (05/19/21)   If you have labs (blood work) drawn today and your tests are completely normal, you will receive your results only by: MyChart Message (if you have MyChart) OR A paper copy in the mail If you have any lab test that is abnormal or we need to change your treatment, we will call you to review the results.   Testing/Procedures: NONE    Follow-Up: At [facility], you and your health needs are our priority.  As part of our continuing mission to provide you with exceptional heart care, we have created designated Provider Care Teams.  These Care Teams include your primary Cardiologist (physician) and Advanced Practice Providers (APPs -  Physician Assistants and Nurse Practitioners) who all work together to provide you with the care you need, when you need it.  We recommend signing up for the patient portal called "MyChart".  Sign up information is provided on this After Visit Summary.  MyChart is used to connect with patients for Virtual Visits (Telemedicine).  Patients are able to view lab/test results, encounter notes, upcoming appointments, etc.  Non-urgent messages can be sent to your provider as well.   To learn more about what you can do with MyChart, go to [email or web address].    Your next appointment:   2 month(s)  The format for your next appointment:   In Person  Provider:   Randall An, PA-C   Other Instructions Thank you for choosing [facility]!    Two Gram Sodium Diet 2000 mg  What is Sodium? Sodium is a mineral found naturally in many foods. The most significant source of sodium in the diet is  table salt, which is about 40% sodium.  Processed, convenience, and preserved foods also contain a large amount of sodium.  The body needs only 500 mg of sodium daily to function,  A normal diet provides more than enough sodium even if you do not use salt.  Why Limit Sodium? A build up of sodium in the body can cause thirst, increased blood pressure, shortness of breath, and water retention.  Decreasing sodium in the diet can reduce edema and risk of heart attack or stroke associated with high blood pressure.  Keep in mind that there are many other factors involved in these health problems.  Heredity, obesity, lack of exercise, cigarette smoking, stress and what you eat all play a role.  General Guidelines: Do not add salt at the table or in cooking.  One teaspoon of salt contains over 2 grams of sodium. Read food labels Avoid processed and convenience foods Ask your dietitian before eating any foods not dicussed in the menu planning guidelines Consult your physician if you wish to use a salt substitute or a sodium containing medication such as antacids.  Limit milk and milk products to 16 oz (2 cups) per day.  Shopping Hints: READ LABELS!! "Dietetic" does not necessarily mean low sodium. Salt and other sodium ingredients are often added to foods during processing.    Menu Planning Guidelines Food Group Choose More Often Avoid  Beverages (see also the milk group All fruit juices, low-sodium, salt-free vegetables juices, low-sodium carbonated beverages Regular  vegetable or tomato juices, commercially softened water used for drinking or cooking  Breads and Cereals Enriched white, wheat, rye and pumpernickel bread, hard rolls and dinner rolls; muffins, cornbread and waffles; most dry cereals, cooked cereal without added salt; unsalted crackers and breadsticks; low sodium or homemade bread crumbs Bread, rolls and crackers with salted tops; quick breads; instant hot cereals; pancakes; commercial bread  stuffing; self-rising flower and biscuit mixes; regular bread crumbs or cracker crumbs  Desserts and Sweets Desserts and sweets mad with mild should be within allowance Instant pudding mixes and cake mixes  Fats Butter or margarine; vegetable oils; unsalted salad dressings, regular salad dressings limited to 1 Tbs; light, sour and heavy cream Regular salad dressings containing bacon fat, bacon bits, and salt pork; snack dips made with instant soup mixes or processed cheese; salted nuts  Fruits Most fresh, frozen and canned fruits Fruits processed with salt or sodium-containing ingredient (some dried fruits are processed with sodium sulfites        Vegetables Fresh, frozen vegetables and low- sodium canned vegetables Regular canned vegetables, sauerkraut, pickled vegetables, and others prepared in brine; frozen vegetables in sauces; vegetables seasoned with ham, bacon or salt pork  Condiments, Sauces, Miscellaneous  Salt substitute with physician's approval; pepper, herbs, spices; vinegar, lemon or lime juice; hot pepper sauce; garlic powder, onion powder, low sodium soy sauce (1 Tbs.); low sodium condiments (ketchup, chili sauce, mustard) in limited amounts (1 tsp.) fresh ground horseradish; unsalted tortilla chips, pretzels, potato chips, popcorn, salsa (1/4 cup) Any seasoning made with salt including garlic salt, celery salt, onion salt, and seasoned salt; sea salt, rock salt, kosher salt; meat tenderizers; monosodium glutamate; mustard, regular soy sauce, barbecue, sauce, chili sauce, teriyaki sauce, steak sauce, Worcestershire sauce, and most flavored vinegars; canned gravy and mixes; regular condiments; salted snack foods, olives, picles, relish, horseradish sauce, catsup   Food preparation: Try these seasonings Meats:    Pork Sage, onion Serve with applesauce  Chicken Poultry seasoning, thyme, parsley Serve with cranberry sauce  Lamb Curry powder, rosemary, garlic, thyme Serve with mint sauce  or jelly  Veal Marjoram, basil Serve with current jelly, cranberry sauce  Beef Pepper, bay leaf Serve with dry mustard, unsalted chive butter  Fish [place] leaf, dill Serve with unsalted lemon butter, unsalted parsley butter  Vegetables:    Asparagus Lemon juice   Broccoli Lemon juice   Carrots Mustard dressing parsley, mint, nutmeg, glazed with unsalted butter and sugar   Green beans Marjoram, lemon juice, nutmeg,dill seed   Tomatoes Basil, marjoram, onion   Spice /blend for [employer]" 4 tsp ground thyme 1 tsp ground sage 3 tsp ground rosemary 4 tsp ground marjoram

## 2021-05-18 ENCOUNTER — Encounter (HOSPITAL_COMMUNITY): Payer: Self-pay | Admitting: *Deleted

## 2021-05-19 ENCOUNTER — Other Ambulatory Visit: Payer: Self-pay

## 2021-05-19 ENCOUNTER — Other Ambulatory Visit: Payer: Medicare HMO

## 2021-05-19 DIAGNOSIS — Z79899 Other long term (current) drug therapy: Secondary | ICD-10-CM | POA: Diagnosis not present

## 2021-05-20 ENCOUNTER — Telehealth: Payer: Self-pay | Admitting: *Deleted

## 2021-05-20 ENCOUNTER — Telehealth: Payer: Self-pay | Admitting: Student

## 2021-05-20 DIAGNOSIS — Z79899 Other long term (current) drug therapy: Secondary | ICD-10-CM

## 2021-05-20 LAB — BASIC METABOLIC PANEL
BUN/Creatinine Ratio: 13 (ref 10–24)
BUN: 31 mg/dL — ABNORMAL HIGH (ref 8–27)
CO2: 22 mmol/L (ref 20–29)
Calcium: 9.3 mg/dL (ref 8.6–10.2)
Chloride: 101 mmol/L (ref 96–106)
Creatinine, Ser: 2.3 mg/dL — ABNORMAL HIGH (ref 0.76–1.27)
Glucose: 100 mg/dL — ABNORMAL HIGH (ref 65–99)
Potassium: 5 mmol/L (ref 3.5–5.2)
Sodium: 137 mmol/L (ref 134–144)
eGFR: 30 mL/min/{1.73_m2} — ABNORMAL LOW (ref >59)

## 2021-05-20 MED ORDER — TORSEMIDE 20 MG PO TABS: 20.0000 mg | ORAL_TABLET | ORAL | 11 refills | Status: DC

## 2021-05-20 NOTE — Telephone Encounter (Signed)
Pt notified and voiced understanding 

## 2021-05-20 NOTE — Telephone Encounter (Signed)
Patient is returning a call to Kisha . 

## 2021-05-20 NOTE — Telephone Encounter (Signed)
-----   Message from Ellsworth Lennox, [place] sent at 05/20/2021  7:36 AM EDT ----- Please let the patient know that his kidney function has worsened since increasing Torsemide to 20 mg every other day. Would confirm that he has not had to take extra tablets. If he is only been taking Torsemide 20 mg every other day, would reduce back to 20 mg twice weekly and only take an extra tablet if needed for weight gain or edema.  Electrolytes are within a normal range. Recheck again in 2-3 weeks to make sure this has improved.

## 2021-05-20 NOTE — Telephone Encounter (Signed)
Letter mailed by jasmine brown cma

## 2021-05-20 NOTE — Telephone Encounter (Signed)
Returned call to pt and notified of lab results.

## 2021-05-31 NOTE — Telephone Encounter (Signed)
Advanced Heart Failure Patient Advocate Encounter  Called and spoke to patient, he provided missing information from application.   Submitted Patient Assistance Application to [facility] for  [place]  along with provider portion. Will update patient when we receive a response.  Fax# [phone] Phone# [phone]

## 2021-06-01 ENCOUNTER — Other Ambulatory Visit: Payer: Medicare HMO

## 2021-06-01 ENCOUNTER — Other Ambulatory Visit: Payer: Self-pay

## 2021-06-01 DIAGNOSIS — E1169 Type 2 diabetes mellitus with other specified complication: Secondary | ICD-10-CM | POA: Diagnosis not present

## 2021-06-01 DIAGNOSIS — E785 Hyperlipidemia, unspecified: Secondary | ICD-10-CM | POA: Diagnosis not present

## 2021-06-01 DIAGNOSIS — N1832 Chronic kidney disease, stage 3b: Secondary | ICD-10-CM | POA: Diagnosis not present

## 2021-06-01 DIAGNOSIS — E1159 Type 2 diabetes mellitus with other circulatory complications: Secondary | ICD-10-CM | POA: Diagnosis not present

## 2021-06-01 DIAGNOSIS — I152 Hypertension secondary to endocrine disorders: Secondary | ICD-10-CM

## 2021-06-01 DIAGNOSIS — E1122 Type 2 diabetes mellitus with diabetic chronic kidney disease: Secondary | ICD-10-CM | POA: Diagnosis not present

## 2021-06-01 DIAGNOSIS — Z79899 Other long term (current) drug therapy: Secondary | ICD-10-CM | POA: Diagnosis not present

## 2021-06-01 DIAGNOSIS — N183 Type 2 diabetes mellitus with diabetic chronic kidney disease: Secondary | ICD-10-CM

## 2021-06-01 LAB — CBC WITH DIFFERENTIAL/PLATELET
Basophils Absolute: 0 10*3/uL (ref 0.0–0.2)
Basos: 0 %
EOS (ABSOLUTE): 0.1 10*3/uL (ref 0.0–0.4)
Eos: 1 %
Hematocrit: 39 % (ref 37.5–51.0)
Hemoglobin: 13.5 g/dL (ref 13.0–17.7)
Immature Grans (Abs): 0 10*3/uL (ref 0.0–0.1)
Immature Granulocytes: 0 %
Lymphocytes Absolute: 1.5 10*3/uL (ref 0.7–3.1)
Lymphs: 18 %
MCH: 29.3 pg (ref 26.6–33.0)
MCHC: 34.6 g/dL (ref 31.5–35.7)
MCV: 85 fL (ref 79–97)
Monocytes Absolute: 0.5 10*3/uL (ref 0.1–0.9)
Monocytes: 6 %
Neutrophils Absolute: 6.1 10*3/uL (ref 1.4–7.0)
Neutrophils: 75 %
Platelets: 215 10*3/uL (ref 150–450)
RBC: 4.6 x10E6/uL (ref 4.14–5.80)
RDW: 12.8 % (ref 11.6–15.4)
WBC: 8.2 10*3/uL (ref 3.4–10.8)

## 2021-06-01 LAB — LIPID PANEL
Chol/HDL Ratio: 2.5 ratio (ref 0.0–5.0)
Cholesterol, Total: 122 mg/dL (ref 100–199)
HDL: 48 mg/dL (ref >39)
LDL Chol Calc (NIH): 60 mg/dL (ref 0–99)
Triglycerides: 70 mg/dL (ref 0–149)
VLDL Cholesterol Cal: 14 mg/dL (ref 5–40)

## 2021-06-01 LAB — BAYER DCA HB A1C WAIVED: HB A1C (BAYER DCA - WAIVED): 5.6 % (ref <7.0)

## 2021-06-01 NOTE — Telephone Encounter (Signed)
Advanced Heart Failure Patient Advocate Encounter  Received fax from [facility], requesting patient to apply for Medicare Extra Help based on the household income. Called patient to discuss and verify income, left message to return call.

## 2021-06-02 ENCOUNTER — Encounter: Payer: Self-pay | Admitting: Family Medicine

## 2021-06-02 ENCOUNTER — Ambulatory Visit (INDEPENDENT_AMBULATORY_CARE_PROVIDER_SITE_OTHER): Payer: Medicare HMO | Admitting: Family Medicine

## 2021-06-02 VITALS — BP 142/66 | HR 63 | Ht 71.0 in | Wt 181.0 lb

## 2021-06-02 DIAGNOSIS — E785 Hyperlipidemia, unspecified: Secondary | ICD-10-CM | POA: Diagnosis not present

## 2021-06-02 DIAGNOSIS — N183 Chronic kidney disease, stage 3 unspecified: Secondary | ICD-10-CM

## 2021-06-02 DIAGNOSIS — I152 Hypertension secondary to endocrine disorders: Secondary | ICD-10-CM

## 2021-06-02 DIAGNOSIS — E1169 Type 2 diabetes mellitus with other specified complication: Secondary | ICD-10-CM

## 2021-06-02 DIAGNOSIS — N1832 Chronic kidney disease, stage 3b: Secondary | ICD-10-CM | POA: Diagnosis not present

## 2021-06-02 DIAGNOSIS — E1122 Type 2 diabetes mellitus with diabetic chronic kidney disease: Secondary | ICD-10-CM | POA: Diagnosis not present

## 2021-06-02 DIAGNOSIS — E1159 Type 2 diabetes mellitus with other circulatory complications: Secondary | ICD-10-CM

## 2021-06-02 LAB — BASIC METABOLIC PANEL
BUN/Creatinine Ratio: 11 (ref 10–24)
BUN: 16 mg/dL (ref 8–27)
CO2: 23 mmol/L (ref 20–29)
Calcium: 9.1 mg/dL (ref 8.6–10.2)
Chloride: 101 mmol/L (ref 96–106)
Creatinine, Ser: 1.41 mg/dL — ABNORMAL HIGH (ref 0.76–1.27)
Glucose: 91 mg/dL (ref 65–99)
Potassium: 5.2 mmol/L (ref 3.5–5.2)
Sodium: 136 mmol/L (ref 134–144)
eGFR: 55 mL/min/{1.73_m2} — ABNORMAL LOW (ref >59)

## 2021-06-02 NOTE — Progress Notes (Signed)
BP (!) 142/66   Pulse 63   Ht 5\' 11"  (1.803 m)   Wt 181 lb (82.1 kg)   SpO2 99%   BMI 25.24 kg/m    Subjective:   Patient ID: Joseph [name], male    DOB: [birth year], 65 y.o.   MRN: [id number]  HPI: Joseph [name] is a 65 y.o. male presenting on 06/02/2021 for Medical Management of Chronic Issues, Diabetes, and Hypertension   HPI Type 2 diabetes mellitus Patient comes in today for recheck of his diabetes. Patient has been currently taking no medication currently has been diet controlled except he is taking the Jardiance more for his heart then for the diabetes.  A1c is 5.6. Patient is currently on an ACE inhibitor/ARB. Patient has not seen an ophthalmologist this year. Patient denies any issues with their feet. The symptom started onset as an adult hypertension and CKD and hyperlipidemia and CHF ARE RELATED TO DM   Hypertension Patient is currently on enalapril and metoprolol and spironolactone and torsemide, and their blood pressure today is 142/66 but he has home blood pressures that are running very typically in the 120s to 130s.. Patient denies any lightheadedness or dizziness. Patient denies headaches, blurred vision, chest pains, shortness of breath, or weakness. Denies any side effects from medication and is content with current medication.   Hyperlipidemia Patient is coming in for recheck of his hyperlipidemia. The patient is currently taking atorvastatin. They deny any issues with myalgias or history of liver damage from it. They deny any focal numbness or weakness or chest pain.   Relevant past medical, surgical, family and social history reviewed and updated as indicated. Interim medical history since our last visit reviewed. Allergies and medications reviewed and updated.  Review of Systems  Constitutional:  Negative for chills and fever.  Eyes:  Negative for visual disturbance.  Respiratory:  Negative for shortness of breath and wheezing.   Cardiovascular:  Negative for  chest pain, palpitations and leg swelling.  Skin:  Negative for rash.  Neurological:  Negative for dizziness, weakness and numbness.  All other systems reviewed and are negative.  Per HPI unless specifically indicated above   Allergies as of 06/02/2021       Reactions   Entresto [sacubitril-valsartan]    Worsening renal function and weight gain        Medication List        Accurate as of June 02, 2021 11:14 AM. If you have any questions, ask your nurse or doctor.          acetaminophen 500 MG tablet Commonly known as: TYLENOL Take 500 mg by mouth every 6 (six) hours as needed for pain.   albuterol 108 (90 Base) MCG/ACT inhaler Commonly known as: VENTOLIN HFA Inhale 2 puffs into the lungs every 4 (four) hours as needed for wheezing or shortness of breath.   aspirin EC 81 MG tablet Take 81 mg by mouth every evening. Swallow whole.   atorvastatin 80 MG tablet Commonly known as: LIPITOR Take 1 tablet (80 mg total) by mouth every evening.   empagliflozin 10 MG Tabs tablet Commonly known as: Jardiance Take 1 tablet (10 mg total) by mouth daily before breakfast.   enalapril 20 MG tablet Commonly known as: VASOTEC Take 1 tablet (20 mg total) by mouth 2 (two) times daily.   EPINEPHrine 0.3 mg/0.3 mL Soaj injection Commonly known as: EPI-PEN Inject 0.3 mg into the muscle as needed for anaphylaxis.   fish oil-omega-3 fatty acids  1000 MG capsule Take 1 g by mouth 3 (three) times daily.   hydrOXYzine 25 MG capsule Commonly known as: VISTARIL Take 1 capsule (25 mg total) by mouth 3 (three) times daily as needed. What changed: reasons to take this   metoprolol succinate 100 MG 24 hr tablet Commonly known as: TOPROL-XL Take 1 tablet (100 mg total) by mouth daily. Take with or immediately following a meal.   multivitamin with minerals Tabs tablet Take 1 tablet by mouth daily.   nitroGLYCERIN 0.4 MG SL tablet Commonly known as: NITROSTAT Place 1 tablet (0.4 mg  total) under the tongue every 5 (five) minutes as needed for chest pain.   omeprazole 20 MG capsule Commonly known as: PRILOSEC TAKE 1 CAPSULE BY MOUTH EVERY DAY What changed:  how much to take how to take this when to take this additional instructions   spironolactone 25 MG tablet Commonly known as: ALDACTONE Take 0.5 tablets (12.5 mg total) by mouth daily.   torsemide 20 MG tablet Commonly known as: DEMADEX Take 1 tablet (20 mg total) by mouth 2 (two) times a week. May take an extra for weight gain or edema         Objective:   BP (!) 142/66   Pulse 63   Ht 5\' 11"  (1.803 m)   Wt 181 lb (82.1 kg)   SpO2 99%   BMI 25.24 kg/m   Wt Readings from Last 3 Encounters:  06/02/21 181 lb (82.1 kg)  05/05/21 180 lb (81.6 kg)  05/03/21 181 lb (82.1 kg)    Physical Exam Vitals and nursing note reviewed.  Constitutional:      General: He is not in acute distress.    Appearance: He is well-developed. He is not diaphoretic.  Eyes:     General: No scleral icterus.    Conjunctiva/sclera: Conjunctivae normal.  Neck:     Thyroid: No thyromegaly.  Cardiovascular:     Rate and Rhythm: Normal rate and regular rhythm.     Heart sounds: Normal heart sounds. No murmur heard. Pulmonary:     Effort: Pulmonary effort is normal. No respiratory distress.     Breath sounds: Normal breath sounds. No wheezing.  Musculoskeletal:        General: No swelling. Normal range of motion.     Cervical back: Neck supple.  Lymphadenopathy:     Cervical: No cervical adenopathy.  Skin:    General: Skin is warm and dry.     Findings: No rash.  Neurological:     Mental Status: He is alert and oriented to person, place, and time.     Coordination: Coordination normal.  Psychiatric:        Behavior: Behavior normal.    Results for orders placed or performed in visit on 06/01/21  CBC with Differential/Platelet  Result Value Ref Range   WBC 8.2 3.4 - 10.8 x10E3/uL   RBC 4.60 4.14 - 5.80 x10E6/uL    Hemoglobin 13.5 13.0 - 17.7 g/dL   Hematocrit 08/02/21 58.8 - 51.0 %   MCV 85 79 - 97 fL   MCH 29.3 26.6 - 33.0 pg   MCHC 34.6 31.5 - 35.7 g/dL   RDW 50.2 77.4 - 12.8 %   Platelets 215 150 - 450 x10E3/uL   Neutrophils 75 Not Estab. %   Lymphs 18 Not Estab. %   Monocytes 6 Not Estab. %   Eos 1 Not Estab. %   Basos 0 Not Estab. %   Neutrophils Absolute 6.1  1.4 - 7.0 x10E3/uL   Lymphocytes Absolute 1.5 0.7 - 3.1 x10E3/uL   Monocytes Absolute 0.5 0.1 - 0.9 x10E3/uL   EOS (ABSOLUTE) 0.1 0.0 - 0.4 x10E3/uL   Basophils Absolute 0.0 0.0 - 0.2 x10E3/uL   Immature Granulocytes 0 Not Estab. %   Immature Grans (Abs) 0.0 0.0 - 0.1 x10E3/uL  Lipid panel  Result Value Ref Range   Cholesterol, Total 122 100 - 199 mg/dL   Triglycerides 70 0 - 149 mg/dL   HDL 48 >14 mg/dL   VLDL Cholesterol Cal 14 5 - 40 mg/dL   LDL Chol Calc (NIH) 60 0 - 99 mg/dL   Chol/HDL Ratio 2.5 0.0 - 5.0 ratio  Bayer DCA Hb A1c Waived  Result Value Ref Range   HB A1C (BAYER DCA - WAIVED) 5.6 <7.0 %    Assessment & Plan:   Problem List Items Addressed This Visit       Cardiovascular and Mediastinum   Hypertension associated with diabetes (HCC)     Endocrine   Hyperlipidemia associated with type 2 diabetes mellitus (HCC)   Controlled diabetes mellitus with stage 3 chronic kidney disease (HCC) - Primary     Genitourinary   Stage 3 chronic kidney disease (HCC)    Kidney function greatly improved, they continue to balance between the kidneys and the heart failure.  He seems to be doing pretty well and blood pressures at home look pretty good and his weights at home that he weighs daily that he brought with him look pretty good.  A1c also looks good at 5.6.  No change in medication. Follow up plan: Return in about 3 months (around 09/02/2021), or if symptoms worsen or fail to improve, for Diabetes and CKD and hypertension and cholesterol recheck.  Counseling provided for all of the vaccine components No orders of the  defined types were placed in this encounter.   Arville Care, MD [facility] 06/02/2021, 11:14 AM

## 2021-06-02 NOTE — Telephone Encounter (Signed)
Advanced Heart Failure Patient Advocate Encounter  Spoke to patient, he advised that he has the only income for the household and his wife has no income. Discussed Medicare Extra help process and how we would need the decision letter. Patient was driving and was unable to write down number. I advised that he should receive the same letter with their phone number in the mail, also advised patient that he could sign up via the Social security website.  Patient will call office with any questions or concerns.

## 2021-06-20 ENCOUNTER — Other Ambulatory Visit: Payer: Self-pay | Admitting: Family Medicine

## 2021-06-30 ENCOUNTER — Other Ambulatory Visit: Payer: Self-pay

## 2021-06-30 ENCOUNTER — Other Ambulatory Visit: Payer: Medicare HMO

## 2021-06-30 DIAGNOSIS — N1831 Chronic kidney disease, stage 3a: Secondary | ICD-10-CM | POA: Diagnosis not present

## 2021-06-30 DIAGNOSIS — I5042 Chronic combined systolic (congestive) and diastolic (congestive) heart failure: Secondary | ICD-10-CM | POA: Diagnosis not present

## 2021-06-30 DIAGNOSIS — I129 Hypertensive chronic kidney disease with stage 1 through stage 4 chronic kidney disease, or unspecified chronic kidney disease: Secondary | ICD-10-CM | POA: Diagnosis not present

## 2021-07-01 ENCOUNTER — Encounter: Payer: Self-pay | Admitting: Student

## 2021-07-01 ENCOUNTER — Ambulatory Visit (INDEPENDENT_AMBULATORY_CARE_PROVIDER_SITE_OTHER): Payer: Medicare HMO | Admitting: Student

## 2021-07-01 VITALS — BP 102/58 | HR 60 | Ht 71.0 in | Wt 183.0 lb

## 2021-07-01 DIAGNOSIS — E785 Hyperlipidemia, unspecified: Secondary | ICD-10-CM | POA: Diagnosis not present

## 2021-07-01 DIAGNOSIS — N1831 Chronic kidney disease, stage 3a: Secondary | ICD-10-CM

## 2021-07-01 DIAGNOSIS — I5042 Chronic combined systolic (congestive) and diastolic (congestive) heart failure: Secondary | ICD-10-CM | POA: Diagnosis not present

## 2021-07-01 DIAGNOSIS — I10 Essential (primary) hypertension: Secondary | ICD-10-CM | POA: Diagnosis not present

## 2021-07-01 DIAGNOSIS — I129 Hypertensive chronic kidney disease with stage 1 through stage 4 chronic kidney disease, or unspecified chronic kidney disease: Secondary | ICD-10-CM | POA: Diagnosis not present

## 2021-07-01 DIAGNOSIS — I5022 Chronic systolic (congestive) heart failure: Secondary | ICD-10-CM

## 2021-07-01 DIAGNOSIS — I2510 Atherosclerotic heart disease of native coronary artery without angina pectoris: Secondary | ICD-10-CM

## 2021-07-01 NOTE — Progress Notes (Signed)
Cardiology Office Note    Date:  07/01/2021   ID:  Joseph, [name] 05-02-1956, MRN [id number]  PCP:  [name], Elige Radon, MD  Cardiologist: Rollene Rotunda, MD  --> Patient has requested to follow-up with Dr. Diona Browner AHF: Dr. Gala Romney  Chief Complaint  Patient presents with   Follow-up    2 month visit    History of Present Illness:    Joseph [name] is a 65 y.o. male with past medical history of CAD (s/p CABG in 2000, patent grafts by cath in 2011 and patent grafts by repeat cath in 08/2020), chronic combined systolic and diastolic CHF (EF 16-07% in 2016, at 35-40% by echo in 10/2017, 30-35% in 01/2019, and 35-40% by echo in 08/2020, s/p St. Jude ICD placement in 2011), HTN, HLD, Type 2 DM (diet-controlled) and Stage 3 CKD who presents to the office today for 28-month follow-up.  He was last examined by myself in 04/2021 and had recently been evaluated in the ED for worsening dyspnea and BNP was mildly elevated at 101 with troponin values being flat at 12 and 24. At the time of his visit, he reported that his weight had increased by 4 pounds leading up to ED evaluation and he had consumed more high sodium foods that day (chili and chicken nuggets from [facility]). He reported feeling back to baseline at the time of his office visit.  He was continued on Enalapril 20 mg twice daily (previously intolerant to [place]), Toprol-XL 100 mg daily, Spironolactone 12.5 mg daily and Jardiance 10 mg daily. He was currently taking Torsemide 20 mg twice weekly and it was recommended to try to titrate this to 20 mg every other day. Repeat labs showed his creatinine had increased from 1.41 to 2.30, therefore this was reduced back to 20 mg twice weekly. He did have repeat labs with his PCP on 06/01/2021 and creatinine had improved to 1.41.  In talking with the patient and his wife today, he reports one episode of what seems most consistent with flash pulmonary edema on 06/17/2021 as his weight had increased from  179 lbs to 185 lbs overnight. He took an extra Torsemide tablet and EMS was called out but he did not require transport due to quick improvement in his symptoms. He reports his breathing has overall been stable since and denies any specific orthopnea, PND or pitting edema. He is unaware of any dietary changes which could have triggered his episode last month. He has been trying to follow a low-sodium diet and is monitoring his fluid intake. Denies any recent chest pain or palpitations.   Past Medical History:  Diagnosis Date   CHF (congestive heart failure) (HCC)    a. EF 25-30% in 2016 b. 35-40% by echo in 10/2017 c. 35-40% in 08/2020   COPD (chronic obstructive pulmonary disease) (HCC)    Coronary artery disease    a. s/p CABG in 2000 b. patent grafts in 2011 c. 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 Jude   Small bowel obstruction (HCC) 2007   resolved without surgery    Past Surgical History:  Procedure Laterality  Date   basilic vein left arm resection     CARDIAC DEFIBRILLATOR PLACEMENT     CORONARY ARTERY BYPASS GRAFT     6 vessels, 2000   INSERT / REPLACE / REMOVE PACEMAKER  01/05/10   ICD insertion/St. Jude single chamber   RIGHT/LEFT HEART CATH AND CORONARY/GRAFT ANGIOGRAPHY N/A 09/23/2020   Procedure: RIGHT/LEFT HEART CATH AND CORONARY/GRAFT ANGIOGRAPHY;  Surgeon: Tonny Bollman, MD;  Location: [facility] INVASIVE CV LAB;  Service: Cardiovascular;  Laterality: N/A;    Current Medications: Outpatient Medications Prior to Visit  Medication Sig Dispense Refill   acetaminophen (TYLENOL) 500 MG tablet Take 500 mg by mouth every 6 (six)  hours as needed for pain.     albuterol (VENTOLIN HFA) 108 (90 Base) MCG/ACT inhaler Inhale 2 puffs into the lungs every 4 (four) hours as needed for wheezing or shortness of breath. 6.7 g 0   aspirin EC 81 MG tablet Take 81 mg by mouth every evening. Swallow whole.     atorvastatin (LIPITOR) 80 MG tablet Take 1 tablet (80 mg total) by mouth every evening. 90 tablet 3   empagliflozin (JARDIANCE) 10 MG TABS tablet Take 1 tablet (10 mg total) by mouth daily before breakfast. 30 tablet 6   enalapril (VASOTEC) 20 MG tablet Take 1 tablet (20 mg total) by mouth 2 (two) times daily. 180 tablet 3   fish oil-omega-3 fatty acids 1000 MG capsule Take 1 g by mouth 3 (three) times daily.     hydrOXYzine (VISTARIL) 25 MG capsule Take 1 capsule (25 mg total) by mouth 3 (three) times daily as needed. (Patient taking differently: Take 25 mg by mouth 3 (three) times daily as needed for anxiety.) 30 capsule 0   metoprolol succinate (TOPROL-XL) 100 MG 24 hr tablet Take 1 tablet (100 mg total) by mouth daily. Take with or immediately following a meal. 90 tablet 3   Multiple Vitamin (MULTIVITAMIN WITH MINERALS) TABS tablet Take 1 tablet by mouth daily.     nitroGLYCERIN (NITROSTAT) 0.4 MG SL tablet Place 1 tablet (0.4 mg total) under the tongue every 5 (five) minutes as needed for chest pain. 25 tablet 3   omeprazole (PRILOSEC) 20 MG capsule TAKE 1 CAPSULE BY MOUTH EVERY DAY 90 capsule 0   spironolactone (ALDACTONE) 25 MG tablet Take 0.5 tablets (12.5 mg total) by mouth daily. 45 tablet 3   torsemide (DEMADEX) 20 MG tablet Take 1 tablet (20 mg total) by mouth 2 (two) times a week. May take an extra for weight gain or edema 20 tablet 11   EPINEPHrine 0.3 mg/0.3 mL IJ SOAJ injection Inject 0.3 mg into the muscle as needed for anaphylaxis. (Patient not taking: Reported on 07/01/2021)     No facility-administered medications prior to visit.     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  Tobacco Use   Smoking status: Former    Types: Cigarettes    Start date: 11/28/1967    Quit date: 02/29/1988    Years since quitting: 33.3   Smokeless tobacco: Never  Vaping Use   Vaping Use: Never used  Substance and Sexual Activity   Alcohol use: No    Alcohol/week: 0.0 standard drinks  Drug use: No   Sexual activity: Never  Other Topics Concern   Not on file  Social History Narrative   1 son died age 65-Lee's syndrome         Social Determinants of Health   Financial Resource Strain: Not on file  Food Insecurity: Not on file  Transportation Needs: Not on file  Physical Activity: Not on file  Stress: Not on file  Social Connections: Not on file     Family History:  The patient's family history includes Coronary artery disease in his father; Emphysema in his father; Heart attack in his mother.   Review of Systems:    Please see the history of present illness.     All other systems reviewed and are otherwise negative except as noted above.   Physical Exam:    VS:  BP (!) 102/58   Pulse 60   Ht 5\' 11"  (1.803 m)   Wt 183 lb (83 kg)   SpO2 98%   BMI 25.52 kg/m    General: Well developed, well nourished,male appearing in no acute distress. Head: Normocephalic, atraumatic. Neck: No carotid bruits. JVD not elevated.  Lungs: Respirations regular and unlabored, without wheezes or rales.  Heart: Regular rate and rhythm. No S3 or S4.  No murmur, no rubs, or gallops appreciated. Abdomen: Appears non-distended. No obvious abdominal masses. Msk:  Strength and tone appear normal for age. No obvious joint deformities or effusions. Extremities: No clubbing or cyanosis. Trace ankle edema bilaterally.  Distal pedal pulses are 2+ bilaterally. Neuro: Alert  and oriented X 3. Moves all extremities spontaneously. No focal deficits noted. Psych:  Responds to questions appropriately with a normal affect. Skin: No rashes or lesions noted  Wt Readings from Last 3 Encounters:  07/01/21 183 lb (83 kg)  06/02/21 181 lb (82.1 kg)  05/05/21 180 lb (81.6 kg)     Studies/Labs Reviewed:   EKG:  EKG is not ordered today.   Recent Labs: 05/03/2021: ALT 21; B Natriuretic Peptide 101.0 06/01/2021: BUN 16; Creatinine, Ser 1.41; Hemoglobin 13.5; Platelets 215; Potassium 5.2; Sodium 136   Lipid Panel    Component Value Date/Time   CHOL 122 06/01/2021 1035   TRIG 70 06/01/2021 1035   HDL 48 06/01/2021 1035   CHOLHDL 2.5 06/01/2021 1035   CHOLHDL 3.2 03/08/2010 0411   VLDL 10 03/08/2010 0411   LDLCALC 60 06/01/2021 1035    Additional studies/ records that were reviewed today include:   Cardiac Catheterization: 08/2020 1.  Severe native three-vessel coronary artery disease with total occlusion of the mid LAD, mid circumflex, and distal RCA 2.  Status post aortocoronary bypass surgery with continued patency of the LIMA to LAD, sequential saphenous vein graft to OM1 and OM 2, saphenous vein graft to diagonal 1, and saphenous vein graft to PDA 3.  Diffuse mid and distal LAD stenosis beyond the LIMA insertion site, progressive from the previous catheterization 10 years ago 4.  Essentially normal right heart hemodynamics with normal filling pressures and preserved cardiac output   Recommendations: Medical therapy for heart failure and coronary artery disease.  I do not appreciate any targets for PCI.  The patient's bypass grafts all remain patent.  Echocardiogram: 08/2020 Summary    1. The left ventricle is mildly dilated in size with mildly increased wall  thickness.    2. The left ventricular systolic function is moderately decreased, LVEF is  visually estimated at 35-40%.    3. There is grade I diastolic dysfunction (  impaired relaxation).    4. There  is mild mitral valve regurgitation.    5. The left atrium is mildly to moderately dilated in size.    6. The right ventricle is normal in size, with normal systolic function.    Assessment:    1. Coronary artery disease involving native coronary artery of native heart without angina pectoris   2. Chronic systolic heart failure (HCC)   3. Stage 3a chronic kidney disease (HCC)   4. Essential hypertension   5. Hyperlipidemia LDL goal <70      Plan:   In order of problems listed above:  1. CAD - He is s/p CABG in 2000 with patent grafts by cath in 2011 and patent grafts by repeat cath in 08/2020. - He denies any recent chest pain or dyspnea on exertion. - Continue current medication regimen with ASA 81 mg daily, Toprol-XL 100 mg daily and Atorvastatin 80 mg daily.  2. HFrEF - Most recent echo in 08/2020 showed his EF was at 35-40% and he does have a St. Jude ICD in place which is followed by Dr. Ladona Ridgelaylor.  - He did have an episode of what sounds most consistent with flash pulmonary edema last month but denies any recurrent symptoms. He does try to follow a more low-sodium diet as this was known to have contributed to his episodes in the past. - Continue current medication regimen with Enalapril 20 mg twice daily (previously intolerant to [place]), Toprol-XL 100 mg daily, Spironolactone 12.5 mg daily (not further titrated due to borderline K+ levels) and Jardiance 10 mg daily. Will continue Torsemide 20 mg twice weekly as renal function did not previously allow for titration of his baseline dose. He is aware to take an extra tablet if needed for worsening dyspnea or weight gain.  3. Stage 3 CKD - His creatinine previously peaked at 2.30 last month, improved to 1.41 by most recent check. Followed by Dr. Wolfgang PhoenixBhutani.   4. HTN - His BP is at 102/58 during today's visit and he denies any associated lightheadedness or dizziness. He does bring with him today a very detailed blood pressure log and BP  has been well controlled when checked at home. Continue current medication regimen.  5. HLD - FLP in 05/2021 showed total cholesterol 122, triglycerides 70, HDL 48 and LDL 60. Continue Atorvastatin 80 mg daily and Fish Oil.    Medication Adjustments/Labs and Tests Ordered: Current medicines are reviewed at length with the patient today.  Concerns regarding medicines are outlined above.  Medication changes, Labs and Tests ordered today are listed in the Patient Instructions below. Patient Instructions  Medication Instructions:  Your physician recommends that you continue on your current medications as directed. Please refer to the Current Medication list given to you today.  *If you need a refill on your cardiac medications before your next appointment, please call your pharmacy*   Lab Work: NONE   If you have labs (blood work) drawn today and your tests are completely normal, you will receive your results only by: MyChart Message (if you have MyChart) OR A paper copy in the mail If you have any lab test that is abnormal or we need to change your treatment, we will call you to review the results.   Testing/Procedures: NONE   Follow-Up: At [facility], you and your health needs are our priority.  As part of our continuing mission to provide you with exceptional heart care, we have created designated Provider Care Teams.  These Care Teams include your primary Cardiologist (physician) and Advanced Practice Providers (APPs -  Physician Assistants and Nurse Practitioners) who all work together to provide you with the care you need, when you need it.  We recommend signing up for the patient portal called "MyChart".  Sign up information is provided on this After Visit Summary.  MyChart is used to connect with patients for Virtual Visits (Telemedicine).  Patients are able to view lab/test results, encounter notes, upcoming appointments, etc.  Non-urgent messages can be sent to your provider  as well.   To learn more about what you can do with MyChart, go to [email or web address].    Your next appointment:    As Scheduled   The format for your next appointment:   In Person  Provider:   Randall An, PA-C   Other Instructions Thank you for choosing [facility]!     Signed, Ellsworth Lennox, PA-C  07/01/2021 6:06 PM     [facility] [address] [place], [place] [address] Phone: [phone] Fax: [phone]

## 2021-07-01 NOTE — Patient Instructions (Signed)
Medication Instructions:  Your physician recommends that you continue on your current medications as directed. Please refer to the Current Medication list given to you today.  *If you need a refill on your cardiac medications before your next appointment, please call your pharmacy*   Lab Work: NONE   If you have labs (blood work) drawn today and your tests are completely normal, you will receive your results only by: MyChart Message (if you have MyChart) OR A paper copy in the mail If you have any lab test that is abnormal or we need to change your treatment, we will call you to review the results.   Testing/Procedures: NONE   Follow-Up: At [facility], you and your health needs are our priority.  As part of our continuing mission to provide you with exceptional heart care, we have created designated Provider Care Teams.  These Care Teams include your primary Cardiologist (physician) and Advanced Practice Providers (APPs -  Physician Assistants and Nurse Practitioners) who all work together to provide you with the care you need, when you need it.  We recommend signing up for the patient portal called "MyChart".  Sign up information is provided on this After Visit Summary.  MyChart is used to connect with patients for Virtual Visits (Telemedicine).  Patients are able to view lab/test results, encounter notes, upcoming appointments, etc.  Non-urgent messages can be sent to your provider as well.   To learn more about what you can do with MyChart, go to [email or web address].    Your next appointment:    As Scheduled   The format for your next appointment:   In Person  Provider:   Randall An, PA-C   Other Instructions Thank you for choosing [facility]!

## 2021-07-04 ENCOUNTER — Ambulatory Visit (INDEPENDENT_AMBULATORY_CARE_PROVIDER_SITE_OTHER): Payer: Medicare HMO

## 2021-07-04 ENCOUNTER — Other Ambulatory Visit (HOSPITAL_COMMUNITY): Payer: Self-pay

## 2021-07-04 ENCOUNTER — Telehealth (HOSPITAL_COMMUNITY): Payer: Self-pay | Admitting: Pharmacy Technician

## 2021-07-04 DIAGNOSIS — I255 Ischemic cardiomyopathy: Secondary | ICD-10-CM

## 2021-07-04 NOTE — Telephone Encounter (Signed)
Advanced Heart Failure Patient Advocate Encounter  Called and spoke with the patient. He has not yet called to apply for LIS. I advised him that we could get the grant and it would help during the meantime. I advised him that applying for LIS during this time is still recommended. The patient was approved for a [employer] through the [employer].  BIN: [id number] PCN: [id number] ID: [id number] Group: [id number]   Approved through 07/04/21-07/03/22  Amount: $1000 Phone Number: [phone]  Fax: [phone]  Emailed a copy of the grant, as requested.  Archer Asa, CPhT

## 2021-07-05 ENCOUNTER — Telehealth: Payer: Self-pay

## 2021-07-05 LAB — CUP PACEART REMOTE DEVICE CHECK
Battery Remaining Longevity: 8 mo
Battery Remaining Percentage: 6 %
Battery Voltage: 2.63 V
Brady Statistic RV Percent Paced: 1 %
Date Time Interrogation Session: 20220808180055
HighPow Impedance: 51 Ohm
Implantable Lead Implant Date: 20110209
Implantable Lead Location: 753860
Implantable Lead Model: 7120
Implantable Pulse Generator Implant Date: 20110209
Lead Channel Impedance Value: 440 Ohm
Lead Channel Pacing Threshold Amplitude: 0.75 V
Lead Channel Pacing Threshold Pulse Width: 0.4 ms
Lead Channel Sensing Intrinsic Amplitude: 11.9 mV
Lead Channel Setting Pacing Amplitude: 2.5 V
Lead Channel Setting Pacing Pulse Width: 0.4 ms
Lead Channel Setting Sensing Sensitivity: 0.5 mV
Pulse Gen Serial Number: 747960

## 2021-07-05 NOTE — Telephone Encounter (Signed)
Scheduled transmission received, device is <8 months until ERI.  Will set up monthly battery checks.  Next scheduled check is now 9/9.  Merlin updated.    Pt has recall for appt with Dr. Ladona Ridgel in November 2022, advised pt to be sure to make that appt.

## 2021-07-21 ENCOUNTER — Ambulatory Visit: Payer: Medicare HMO | Admitting: Student

## 2021-07-27 NOTE — Progress Notes (Signed)
Remote ICD transmission.   

## 2021-07-29 ENCOUNTER — Encounter (HOSPITAL_COMMUNITY): Payer: Self-pay | Admitting: Internal Medicine

## 2021-07-29 ENCOUNTER — Ambulatory Visit (HOSPITAL_COMMUNITY)
Admission: RE | Admit: 2021-07-29 | Discharge: 2021-07-29 | Disposition: A | Discharge status: Home / Self Care | Payer: Medicare HMO | Source: Ambulatory Visit | Attending: Internal Medicine | Admitting: Internal Medicine

## 2021-07-29 ENCOUNTER — Other Ambulatory Visit: Payer: Self-pay

## 2021-07-29 VITALS — BP 120/78 | HR 61 | Wt 180.6 lb

## 2021-07-29 DIAGNOSIS — Z7901 Long term (current) use of anticoagulants: Secondary | ICD-10-CM | POA: Diagnosis not present

## 2021-07-29 DIAGNOSIS — Z7982 Long term (current) use of aspirin: Secondary | ICD-10-CM | POA: Insufficient documentation

## 2021-07-29 DIAGNOSIS — I5022 Chronic systolic (congestive) heart failure: Secondary | ICD-10-CM

## 2021-07-29 DIAGNOSIS — Z951 Presence of aortocoronary bypass graft: Secondary | ICD-10-CM | POA: Insufficient documentation

## 2021-07-29 DIAGNOSIS — N1831 Chronic kidney disease, stage 3a: Secondary | ICD-10-CM | POA: Diagnosis not present

## 2021-07-29 DIAGNOSIS — Z7984 Long term (current) use of oral hypoglycemic drugs: Secondary | ICD-10-CM | POA: Insufficient documentation

## 2021-07-29 DIAGNOSIS — E1122 Type 2 diabetes mellitus with diabetic chronic kidney disease: Secondary | ICD-10-CM | POA: Insufficient documentation

## 2021-07-29 DIAGNOSIS — E785 Hyperlipidemia, unspecified: Secondary | ICD-10-CM | POA: Diagnosis not present

## 2021-07-29 DIAGNOSIS — I2510 Atherosclerotic heart disease of native coronary artery without angina pectoris: Secondary | ICD-10-CM

## 2021-07-29 DIAGNOSIS — Z09 Encounter for follow-up examination after completed treatment for conditions other than malignant neoplasm: Secondary | ICD-10-CM | POA: Insufficient documentation

## 2021-07-29 DIAGNOSIS — I5042 Chronic combined systolic (congestive) and diastolic (congestive) heart failure: Secondary | ICD-10-CM | POA: Insufficient documentation

## 2021-07-29 DIAGNOSIS — Z87891 Personal history of nicotine dependence: Secondary | ICD-10-CM | POA: Diagnosis not present

## 2021-07-29 DIAGNOSIS — I130 Hypertensive heart and chronic kidney disease with heart failure and stage 1 through stage 4 chronic kidney disease, or unspecified chronic kidney disease: Secondary | ICD-10-CM | POA: Insufficient documentation

## 2021-07-29 DIAGNOSIS — Z79899 Other long term (current) drug therapy: Secondary | ICD-10-CM | POA: Insufficient documentation

## 2021-07-29 DIAGNOSIS — Z8249 Family history of ischemic heart disease and other diseases of the circulatory system: Secondary | ICD-10-CM | POA: Diagnosis not present

## 2021-07-29 NOTE — Progress Notes (Signed)
[facility] Note  Primary Care: [name], Elige Radon, MD Primary Cardiologist: Dr. Antoine Poche HF Cardiologist: Dr. Gala Romney  HPI: Joseph [name] is a 65 y.o. male with CAD (s/p CABG in 2000, Mvoview in 05/2008 showing scar with no ischemia, patent grafts in 2011), chronic combined systolic and diastolic CHF (EF 69-62% in 2016 and 35-40% by echo in 08/2020, s/p St. Jude ICD placement in 2011), HTN, HLD, Type 2 DM (diet-controlled) and Stage 3a CKD.  He recently has had multiple ED visits for acute CHF exacerbations. Underwent R/L cath on 09/23/2020 and showed severe native three-vessel CAD with patent LIMA to LAD, seq SVG-OM1-OM2, SVG-D1 and SVG-PDA.  He did have progression of distal LAD stenosis beyond the LIMA insertion site but continued medical therapy was recommended. Right heart hemodynamics were normal with normal filling pressures and preserved cardiac output.  Since the last episode he has drastically changed his diet. Less fluid intake. Weight down from 180 to 174 pounds. Now taking torsemide 20 bid. (recently was taking 60 daily but dropped back because creatinine increased to 1.62 (previously 1.1 - 1.3 in 08/2020)   Was on Entresto in past for almost a year but stopped due to AKI.   ECHO 01/2021 EF 30-35%.   Here for FU.  No changes last visit.  He has been feeling well since then.  Denies dyspnea, orthopnea or PND.  Still taking torsemide 20mg  twice a week, every now and then taking an extra dose but not often.  Started working 3 days a week now at .  Doesn't get short of breath when working.  Going for walks around 30 minutes on off days and walks a lot while he's working.  Weight very stable brought extensive log averages 178.  BP average 130/70 based on log.    ROS: All systems reviewed and negative except as per HPI.   Past Medical History:  Diagnosis Date   CHF (congestive heart failure) (HCC)    a. EF 25-30% in 2016 b. 35-40% by echo in 10/2017  c. 35-40% in 08/2020   COPD (chronic obstructive pulmonary disease) (HCC)    Coronary artery disease    a. s/p CABG in 2000 b. patent grafts in 2011 c. 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 Jude   Small bowel obstruction (HCC) 2007   resolved without surgery    Current Outpatient Medications  Medication Sig Dispense Refill   acetaminophen (TYLENOL) 500 MG tablet Take 500 mg by mouth every 6 (six) hours as needed for pain.     albuterol (VENTOLIN HFA) 108 (90 Base) MCG/ACT inhaler Inhale 2 puffs into the lungs every 4 (four) hours as needed for wheezing or shortness of breath. 6.7 g 0   aspirin EC 81 MG tablet Take 81 mg by mouth every evening. Swallow whole.     atorvastatin (LIPITOR) 80 MG tablet Take 1 tablet (80 mg total) by mouth every evening. 90 tablet 3   empagliflozin (JARDIANCE) 10 MG TABS tablet Take 1 tablet (10 mg total) by mouth daily before breakfast. 30 tablet 6   enalapril (VASOTEC) 20 MG tablet  Take 1 tablet (20 mg total) by mouth 2 (two) times daily. 180 tablet 3   fish oil-omega-3 fatty acids 1000 MG capsule Take 1 g by mouth 3 (three) times daily.     hydrOXYzine (VISTARIL) 25 MG capsule Take 1 capsule (25 mg total) by mouth 3 (three) times daily as needed. 30 capsule 0   metoprolol succinate (TOPROL-XL) 100 MG 24 hr tablet Take 1 tablet (100 mg total) by mouth daily. Take with or immediately following a meal. 90 tablet 3   Multiple Vitamin (MULTIVITAMIN WITH MINERALS) TABS tablet Take 1 tablet by mouth daily.     nitroGLYCERIN (NITROSTAT) 0.4 MG SL tablet Place 1 tablet (0.4 mg total) under the tongue every 5 (five)  minutes as needed for chest pain. 25 tablet 3   omeprazole (PRILOSEC) 20 MG capsule TAKE 1 CAPSULE BY MOUTH EVERY DAY 90 capsule 0   spironolactone (ALDACTONE) 25 MG tablet Take 0.5 tablets (12.5 mg total) by mouth daily. 45 tablet 3   torsemide (DEMADEX) 20 MG tablet Take 1 tablet (20 mg total) by mouth 2 (two) times a week. May take an extra for weight gain or edema 20 tablet 11   No current facility-administered medications for this encounter.    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  Tobacco Use   Smoking status: Former    Types: Cigarettes    Start date: 11/28/1967    Quit date: 02/29/1988    Years since quitting: 33.4   Smokeless tobacco: Never  Vaping Use   Vaping Use: Never used  Substance and Sexual Activity   Alcohol use: No    Alcohol/week: 0.0 standard drinks   Drug use: No   Sexual activity: Never  Other Topics Concern   Not on file  Social History Narrative   1 son died age 71-Lee's syndrome         Social Determinants of Health   Financial Resource Strain: Not on file  Food Insecurity: Not on file  Transportation Needs: Not on file  Physical Activity: Not on file  Stress: Not on file  Social Connections: Not on file  Intimate Partner Violence: Not on file   Family History  Problem Relation Age of Onset   Heart attack Mother    Emphysema Father    Coronary artery disease Father        s/p cabg   Colon cancer Neg Hx    Vitals:   07/29/21 1146  BP: 120/78  Pulse: 61  SpO2: 98%  Weight: 81.9 kg (180 lb 9.6 oz)   Wt Readings from Last 3 Encounters:  07/29/21 81.9 kg (180 lb 9.6 oz)  07/01/21 83 kg (183 lb)  06/02/21 82.1  kg (181 lb)       PHYSICAL EXAM: General:  Well appearing. No resp difficulty HEENT: normal Neck: supple. no JVD. Carotids 2+ bilat; no bruits. No lymphadenopathy or thryomegaly appreciated. Cor: PMI nondisplaced. Regular rate & rhythm. No rubs, gallops or murmurs. Lungs: clear Abdomen: soft, nontender, nondistended. No hepatosplenomegaly. No bruits or masses. Good bowel sounds. Extremities: no cyanosis, clubbing, rash, edema Neuro: alert &  orientedx3, cranial nerves grossly intact. moves all 4 extremities w/o difficulty. Affect pleasant  ASSESSMENT & PLAN: 1. 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 Patent grafts. RHC ok - s/p ST Jude ICD - NYHA I  . Continue torsemide twice a week with an extra 20 mg for weight gain/edema  - Continue Toprol-XL 100 mg daily. - Continue enalapril 20 mg bid. (Previously intolerant to [place]).  - Continue Jardiance 10 mg daily. No GU symptoms.  - Continue  spiro 12.5 mg q hs no room to escalate with potassium level - Considered backing off diuretic and retrying entresto but patient prefers not to.  He is doing so well I think this is completely reasonable  2. CAD - He is s/p CABG in 2000.  - LHC (10/21) showed severe native three-vessel CAD with patent LIMA to LAD, seq SVG-OM1-OM2, SVG-D1 and SVG-PDA. He did have progression of distal LAD stenosis beyond the LIMA insertion site but continued medical therapy was recommended. - No chest pain.  - Continue ASA, atorvastatin, Toprol-XL and SL NTG.    3. HTN - Stable.    4. Stage 3a CKD  - Baseline creatinine 1.1-1.3. Last cr 1.4  on 05/2021 - Follows with Dr. Wolfgang Phoenix with [facility] in [place].   FU in one year with ECHO  Angelita Ingles, MD  07/29/21  Patient seen and examined with the above-signed Advanced Practice Provider and/or Housestaff. I  personally reviewed laboratory data, imaging studies and relevant notes. I independently examined the patient and formulated the important aspects of the plan. I have edited the note to reflect any of my changes or salient points. I have personally discussed the plan with the patient and/or family.  He is doing very well. Compliant with meds. Continues to work on [employer]  General:  Well appearing. No resp difficulty HEENT: normal Neck: supple. no JVD. Carotids 2+ bilat; no bruits. No lymphadenopathy or thryomegaly appreciated. Cor: PMI nondisplaced. Regular rate & rhythm. No rubs, gallops or murmurs. Lungs: clear Abdomen: soft, nontender, nondistended. No hepatosplenomegaly. No bruits or masses. Good bowel sounds. Extremities: no cyanosis, clubbing, rash, edema Neuro: alert & orientedx3, cranial nerves grossly intact. moves all 4 extremities w/o difficulty. Affect pleasant  Doing very well in setting of iCM EF 30-35% NYHA I-II. On 4-drug GDMT. We again discussed switching enalapril to [facility] but he is adamant that he doesn't want to rechallenge due to AKI. No ICD discharges.   Arvilla Meres, MD  2:21 PM

## 2021-07-29 NOTE — Patient Instructions (Signed)
Please call our office in July 2023 to schedule your follow up appointment and echocardiogram  If you have any questions or concerns before your next appointment please send [place] a message through [place] or call our office at [phone].    TO LEAVE A MESSAGE FOR THE NURSE SELECT OPTION 2, PLEASE LEAVE A MESSAGE INCLUDING: YOUR NAME DATE OF BIRTH CALL BACK NUMBER REASON FOR CALL**this is important as we prioritize the call backs  YOU WILL RECEIVE A CALL BACK THE SAME DAY AS LONG AS YOU CALL BEFORE 4:00 PM  At the [facility], you and your health needs are our priority. As part of our continuing mission to provide you with exceptional heart care, we have created designated Provider Care Teams. These Care Teams include your primary Cardiologist (physician) and Advanced Practice Providers (APPs- Physician Assistants and Nurse Practitioners) who all work together to provide you with the care you need, when you need it.   You may see any of the following providers on your designated Care Team at your next follow up: Dr Arvilla Meres Dr Marca Ancona Dr Brandon Melnick, NP Robbie Lis, [place] Mikki Santee Karle Plumber, PharmD   Please be sure to bring in all your medications bottles to every appointment.

## 2021-07-31 DIAGNOSIS — R0689 Other abnormalities of breathing: Secondary | ICD-10-CM | POA: Diagnosis not present

## 2021-07-31 DIAGNOSIS — R0602 Shortness of breath: Secondary | ICD-10-CM | POA: Diagnosis not present

## 2021-07-31 DIAGNOSIS — I5023 Acute on chronic systolic (congestive) heart failure: Secondary | ICD-10-CM | POA: Diagnosis not present

## 2021-07-31 DIAGNOSIS — I454 Nonspecific intraventricular block: Secondary | ICD-10-CM | POA: Diagnosis not present

## 2021-07-31 DIAGNOSIS — J441 Chronic obstructive pulmonary disease with (acute) exacerbation: Secondary | ICD-10-CM | POA: Diagnosis not present

## 2021-07-31 DIAGNOSIS — I509 Heart failure, unspecified: Secondary | ICD-10-CM | POA: Diagnosis not present

## 2021-07-31 DIAGNOSIS — R069 Unspecified abnormalities of breathing: Secondary | ICD-10-CM | POA: Diagnosis not present

## 2021-07-31 DIAGNOSIS — J449 Chronic obstructive pulmonary disease, unspecified: Secondary | ICD-10-CM | POA: Diagnosis not present

## 2021-07-31 DIAGNOSIS — Z743 Need for continuous supervision: Secondary | ICD-10-CM | POA: Diagnosis not present

## 2021-07-31 DIAGNOSIS — R9431 Abnormal electrocardiogram [ECG] [EKG]: Secondary | ICD-10-CM | POA: Diagnosis not present

## 2021-07-31 DIAGNOSIS — Z951 Presence of aortocoronary bypass graft: Secondary | ICD-10-CM | POA: Diagnosis not present

## 2021-07-31 DIAGNOSIS — I10 Essential (primary) hypertension: Secondary | ICD-10-CM | POA: Diagnosis not present

## 2021-08-05 ENCOUNTER — Ambulatory Visit (INDEPENDENT_AMBULATORY_CARE_PROVIDER_SITE_OTHER): Payer: Medicare HMO

## 2021-08-05 DIAGNOSIS — I255 Ischemic cardiomyopathy: Secondary | ICD-10-CM

## 2021-08-05 LAB — CUP PACEART REMOTE DEVICE CHECK
Battery Remaining Longevity: 8 mo
Battery Remaining Percentage: 6 %
Battery Voltage: 2.63 V
Brady Statistic RV Percent Paced: 1 %
Date Time Interrogation Session: 20220909021051
HighPow Impedance: 51 Ohm
Implantable Lead Implant Date: 20110209
Implantable Lead Location: 753860
Implantable Lead Model: 7120
Implantable Pulse Generator Implant Date: 20110209
Lead Channel Impedance Value: 480 Ohm
Lead Channel Pacing Threshold Amplitude: 0.75 V
Lead Channel Pacing Threshold Pulse Width: 0.4 ms
Lead Channel Sensing Intrinsic Amplitude: 12 mV
Lead Channel Setting Pacing Amplitude: 2.5 V
Lead Channel Setting Pacing Pulse Width: 0.4 ms
Lead Channel Setting Sensing Sensitivity: 0.5 mV
Pulse Gen Serial Number: 747960

## 2021-08-11 NOTE — Progress Notes (Signed)
Remote ICD transmission.   

## 2021-08-23 DIAGNOSIS — R0600 Dyspnea, unspecified: Secondary | ICD-10-CM | POA: Diagnosis not present

## 2021-08-23 DIAGNOSIS — J449 Chronic obstructive pulmonary disease, unspecified: Secondary | ICD-10-CM | POA: Diagnosis not present

## 2021-08-23 DIAGNOSIS — Z95810 Presence of automatic (implantable) cardiac defibrillator: Secondary | ICD-10-CM | POA: Diagnosis not present

## 2021-08-23 DIAGNOSIS — Z87891 Personal history of nicotine dependence: Secondary | ICD-10-CM | POA: Diagnosis not present

## 2021-08-23 DIAGNOSIS — R0902 Hypoxemia: Secondary | ICD-10-CM | POA: Diagnosis not present

## 2021-08-23 DIAGNOSIS — N1832 Chronic kidney disease, stage 3b: Secondary | ICD-10-CM | POA: Diagnosis not present

## 2021-08-23 DIAGNOSIS — R059 Cough, unspecified: Secondary | ICD-10-CM | POA: Diagnosis not present

## 2021-08-23 DIAGNOSIS — R0602 Shortness of breath: Secondary | ICD-10-CM | POA: Diagnosis not present

## 2021-08-23 DIAGNOSIS — J439 Emphysema, unspecified: Secondary | ICD-10-CM | POA: Diagnosis not present

## 2021-08-23 DIAGNOSIS — R609 Edema, unspecified: Secondary | ICD-10-CM | POA: Diagnosis not present

## 2021-08-23 DIAGNOSIS — Z20822 Contact with and (suspected) exposure to covid-19: Secondary | ICD-10-CM | POA: Diagnosis not present

## 2021-08-23 DIAGNOSIS — R9431 Abnormal electrocardiogram [ECG] [EKG]: Secondary | ICD-10-CM | POA: Diagnosis not present

## 2021-08-23 DIAGNOSIS — I509 Heart failure, unspecified: Secondary | ICD-10-CM | POA: Diagnosis not present

## 2021-08-30 ENCOUNTER — Other Ambulatory Visit: Payer: Self-pay

## 2021-08-30 ENCOUNTER — Other Ambulatory Visit: Payer: Medicare HMO

## 2021-08-30 DIAGNOSIS — E1122 Type 2 diabetes mellitus with diabetic chronic kidney disease: Secondary | ICD-10-CM | POA: Diagnosis not present

## 2021-08-30 DIAGNOSIS — N183 Chronic kidney disease, stage 3 unspecified: Secondary | ICD-10-CM

## 2021-08-30 DIAGNOSIS — E1159 Type 2 diabetes mellitus with other circulatory complications: Secondary | ICD-10-CM | POA: Diagnosis not present

## 2021-08-30 DIAGNOSIS — E1169 Type 2 diabetes mellitus with other specified complication: Secondary | ICD-10-CM

## 2021-08-30 DIAGNOSIS — E785 Hyperlipidemia, unspecified: Secondary | ICD-10-CM | POA: Diagnosis not present

## 2021-08-30 DIAGNOSIS — I152 Hypertension secondary to endocrine disorders: Secondary | ICD-10-CM

## 2021-08-30 DIAGNOSIS — N1832 Chronic kidney disease, stage 3b: Secondary | ICD-10-CM

## 2021-08-30 LAB — CMP14+EGFR
ALT: 33 IU/L (ref 0–44)
AST: 26 IU/L (ref 0–40)
Albumin/Globulin Ratio: 2 (ref 1.2–2.2)
Albumin: 4.1 g/dL (ref 3.8–4.8)
Alkaline Phosphatase: 108 IU/L (ref 44–121)
BUN/Creatinine Ratio: 22 (ref 10–24)
BUN: 31 mg/dL — ABNORMAL HIGH (ref 8–27)
Bilirubin Total: 0.8 mg/dL (ref 0.0–1.2)
CO2: 24 mmol/L (ref 20–29)
Calcium: 9.5 mg/dL (ref 8.6–10.2)
Chloride: 97 mmol/L (ref 96–106)
Creatinine, Ser: 1.42 mg/dL — ABNORMAL HIGH (ref 0.76–1.27)
Globulin, Total: 2.1 g/dL (ref 1.5–4.5)
Glucose: 103 mg/dL — ABNORMAL HIGH (ref 70–99)
Potassium: 4.1 mmol/L (ref 3.5–5.2)
Sodium: 138 mmol/L (ref 134–144)
Total Protein: 6.2 g/dL (ref 6.0–8.5)
eGFR: 54 mL/min/{1.73_m2} — ABNORMAL LOW (ref >59)

## 2021-08-30 LAB — LIPID PANEL
Chol/HDL Ratio: 2.7 ratio (ref 0.0–5.0)
Cholesterol, Total: 160 mg/dL (ref 100–199)
HDL: 60 mg/dL (ref >39)
LDL Chol Calc (NIH): 79 mg/dL (ref 0–99)
Triglycerides: 117 mg/dL (ref 0–149)
VLDL Cholesterol Cal: 21 mg/dL (ref 5–40)

## 2021-08-30 LAB — CBC WITH DIFFERENTIAL/PLATELET
Basophils Absolute: 0 10*3/uL (ref 0.0–0.2)
Basos: 0 %
EOS (ABSOLUTE): 0 10*3/uL (ref 0.0–0.4)
Eos: 0 %
Hematocrit: 43.9 % (ref 37.5–51.0)
Hemoglobin: 14.7 g/dL (ref 13.0–17.7)
Immature Grans (Abs): 0.1 10*3/uL (ref 0.0–0.1)
Immature Granulocytes: 1 %
Lymphocytes Absolute: 1.6 10*3/uL (ref 0.7–3.1)
Lymphs: 10 %
MCH: 29.5 pg (ref 26.6–33.0)
MCHC: 33.5 g/dL (ref 31.5–35.7)
MCV: 88 fL (ref 79–97)
Monocytes Absolute: 1.1 10*3/uL — ABNORMAL HIGH (ref 0.1–0.9)
Monocytes: 7 %
Neutrophils Absolute: 12.4 10*3/uL — ABNORMAL HIGH (ref 1.4–7.0)
Neutrophils: 82 %
Platelets: 272 10*3/uL (ref 150–450)
RBC: 4.98 x10E6/uL (ref 4.14–5.80)
RDW: 12.9 % (ref 11.6–15.4)
WBC: 15.1 10*3/uL — ABNORMAL HIGH (ref 3.4–10.8)

## 2021-08-30 LAB — BAYER DCA HB A1C WAIVED: HB A1C (BAYER DCA - WAIVED): 5.6 % (ref 4.8–5.6)

## 2021-09-02 ENCOUNTER — Other Ambulatory Visit: Payer: Self-pay

## 2021-09-02 ENCOUNTER — Encounter: Payer: Self-pay | Admitting: Family Medicine

## 2021-09-02 ENCOUNTER — Ambulatory Visit (INDEPENDENT_AMBULATORY_CARE_PROVIDER_SITE_OTHER): Payer: Medicare HMO | Admitting: Family Medicine

## 2021-09-02 VITALS — BP 128/84 | HR 60 | Temp 97.3°F | Ht 71.0 in | Wt 181.4 lb

## 2021-09-02 DIAGNOSIS — E1169 Type 2 diabetes mellitus with other specified complication: Secondary | ICD-10-CM

## 2021-09-02 DIAGNOSIS — E785 Hyperlipidemia, unspecified: Secondary | ICD-10-CM

## 2021-09-02 DIAGNOSIS — I2510 Atherosclerotic heart disease of native coronary artery without angina pectoris: Secondary | ICD-10-CM | POA: Diagnosis not present

## 2021-09-02 DIAGNOSIS — I5022 Chronic systolic (congestive) heart failure: Secondary | ICD-10-CM | POA: Diagnosis not present

## 2021-09-02 DIAGNOSIS — E1159 Type 2 diabetes mellitus with other circulatory complications: Secondary | ICD-10-CM

## 2021-09-02 DIAGNOSIS — N183 Chronic kidney disease, stage 3 unspecified: Secondary | ICD-10-CM

## 2021-09-02 DIAGNOSIS — I152 Hypertension secondary to endocrine disorders: Secondary | ICD-10-CM

## 2021-09-02 DIAGNOSIS — K219 Gastro-esophageal reflux disease without esophagitis: Secondary | ICD-10-CM

## 2021-09-02 DIAGNOSIS — E1122 Type 2 diabetes mellitus with diabetic chronic kidney disease: Secondary | ICD-10-CM | POA: Diagnosis not present

## 2021-09-02 MED ORDER — OMEPRAZOLE 20 MG PO CPDR: 20.0000 mg | DELAYED_RELEASE_CAPSULE | Freq: Every day | ORAL | 3 refills | Status: AC

## 2021-09-02 NOTE — Progress Notes (Signed)
BP 128/84   Pulse 60   Temp (!) 97.3 F (36.3 C)   Ht 5' 11" (1.803 m)   Wt 181 lb 6.4 oz (82.3 kg)   SpO2 93%   BMI 25.30 kg/m    Subjective:   Patient ID: Joseph [name], male    DOB: 1956/02/19, 65 y.o.   MRN: [id number]  HPI: Joseph [name] is a 65 y.o. male presenting on 09/02/2021 for Diabetes   HPI Type 2 diabetes mellitus Patient comes in today for recheck of his diabetes. Patient has been currently taking Jardiance and A1c is 5.6. Patient is currently on an ACE inhibitor/ARB. Patient has seen an ophthalmologist this year. Patient denies any issues with their feet. The symptom started onset as an adult hypertension and hyperlipidemia and CAD and CHF ARE RELATED TO DM   Hypertension Patient is currently on enalapril and metoprolol and spironolactone and torsemide, and their blood pressure today is 128/84. Patient denies any lightheadedness or dizziness. Patient denies headaches, blurred vision, chest pains, shortness of breath, or weakness. Denies any side effects from medication and is content with current medication.   Hyperlipidemia and CHF and CAD Patient is coming in for recheck of his hyperlipidemia. The patient is currently taking atorvastatin and fish oils. They deny any issues with myalgias or history of liver damage from it. They deny any focal numbness or weakness or chest pain.  He is keeping a close eye on his weights and checks up on them regularly.  He follows up with cardiologist as well.  GERD Patient is currently on omeprazole.  She denies any major symptoms or abdominal pain or belching or burping. She denies any blood in her stool or lightheadedness or dizziness.   Relevant past medical, surgical, family and social history reviewed and updated as indicated. Interim medical history since our last visit reviewed. Allergies and medications reviewed and updated.  Review of Systems  Constitutional:  Negative for chills and fever.  Eyes:  Negative for visual  disturbance.  Respiratory:  Negative for shortness of breath and wheezing.   Cardiovascular:  Negative for chest pain and leg swelling.  Musculoskeletal:  Negative for back pain and gait problem.  Skin:  Negative for rash.  Neurological:  Negative for dizziness, weakness and numbness.  All other systems reviewed and are negative.  Per HPI unless specifically indicated above   Allergies as of 09/02/2021       Reactions   Entresto [sacubitril-valsartan]    Worsening renal function and weight gain        Medication List        Accurate as of September 02, 2021  9:27 AM. If you have any questions, ask your nurse or doctor.          acetaminophen 500 MG tablet Commonly known as: TYLENOL Take 500 mg by mouth every 6 (six) hours as needed for pain.   albuterol 108 (90 Base) MCG/ACT inhaler Commonly known as: VENTOLIN HFA Inhale 2 puffs into the lungs every 4 (four) hours as needed for wheezing or shortness of breath.   aspirin EC 81 MG tablet Take 81 mg by mouth every evening. Swallow whole.   atorvastatin 80 MG tablet Commonly known as: LIPITOR Take 1 tablet (80 mg total) by mouth every evening.   empagliflozin 10 MG Tabs tablet Commonly known as: Jardiance Take 1 tablet (10 mg total) by mouth daily before breakfast.   enalapril 20 MG tablet Commonly known as: VASOTEC Take 1 tablet (  20 mg total) by mouth 2 (two) times daily.   fish oil-omega-3 fatty acids 1000 MG capsule Take 1 g by mouth 3 (three) times daily.   hydrOXYzine 25 MG capsule Commonly known as: VISTARIL Take 1 capsule (25 mg total) by mouth 3 (three) times daily as needed.   metoprolol succinate 100 MG 24 hr tablet Commonly known as: TOPROL-XL Take 1 tablet (100 mg total) by mouth daily. Take with or immediately following a meal.   multivitamin with minerals Tabs tablet Take 1 tablet by mouth daily.   nitroGLYCERIN 0.4 MG SL tablet Commonly known as: NITROSTAT Place 1 tablet (0.4 mg total) under  the tongue every 5 (five) minutes as needed for chest pain.   omeprazole 20 MG capsule Commonly known as: PRILOSEC Take 1 capsule (20 mg total) by mouth daily. What changed: how much to take Changed by: Joshua A [name], MD   spironolactone 25 MG tablet Commonly known as: ALDACTONE Take 0.5 tablets (12.5 mg total) by mouth daily.   torsemide 20 MG tablet Commonly known as: DEMADEX Take 1 tablet (20 mg total) by mouth 2 (two) times a week. May take an extra for weight gain or edema         Objective:   BP 128/84   Pulse 60   Temp (!) 97.3 F (36.3 C)   Ht 5' 11" (1.803 m)   Wt 181 lb 6.4 oz (82.3 kg)   SpO2 93%   BMI 25.30 kg/m   Wt Readings from Last 3 Encounters:  09/02/21 181 lb 6.4 oz (82.3 kg)  07/29/21 180 lb 9.6 oz (81.9 kg)  07/01/21 183 lb (83 kg)    Physical Exam Vitals and nursing note reviewed.  Constitutional:      General: He is not in acute distress.    Appearance: He is well-developed. He is not diaphoretic.  Eyes:     General: No scleral icterus.    Conjunctiva/sclera: Conjunctivae normal.  Neck:     Thyroid: No thyromegaly.  Cardiovascular:     Rate and Rhythm: Normal rate and regular rhythm.     Heart sounds: Normal heart sounds. No murmur heard. Pulmonary:     Effort: Pulmonary effort is normal. No respiratory distress.     Breath sounds: Normal breath sounds. No wheezing.  Musculoskeletal:     Cervical back: Neck supple.  Lymphadenopathy:     Cervical: No cervical adenopathy.  Skin:    General: Skin is warm and dry.     Findings: No rash.  Neurological:     Mental Status: He is alert and oriented to person, place, and time.     Coordination: Coordination normal.  Psychiatric:        Behavior: Behavior normal.    Results for orders placed or performed in visit on 08/30/21  CBC with Differential/Platelet  Result Value Ref Range   WBC 15.1 (H) 3.4 - 10.8 x10E3/uL   RBC 4.98 4.14 - 5.80 x10E6/uL   Hemoglobin 14.7 13.0 - 17.7  g/dL   Hematocrit 43.9 37.5 - 51.0 %   MCV 88 79 - 97 fL   MCH 29.5 26.6 - 33.0 pg   MCHC 33.5 31.5 - 35.7 g/dL   RDW 12.9 11.6 - 15.4 %   Platelets 272 150 - 450 x10E3/uL   Neutrophils 82 Not Estab. %   Lymphs 10 Not Estab. %   Monocytes 7 Not Estab. %   Eos 0 Not Estab. %   Basos 0 Not Estab. %     Neutrophils Absolute 12.4 (H) 1.4 - 7.0 x10E3/uL   Lymphocytes Absolute 1.6 0.7 - 3.1 x10E3/uL   Monocytes Absolute 1.1 (H) 0.1 - 0.9 x10E3/uL   EOS (ABSOLUTE) 0.0 0.0 - 0.4 x10E3/uL   Basophils Absolute 0.0 0.0 - 0.2 x10E3/uL   Immature Granulocytes 1 Not Estab. %   Immature Grans (Abs) 0.1 0.0 - 0.1 x10E3/uL  CMP14+EGFR  Result Value Ref Range   Glucose 103 (H) 70 - 99 mg/dL   BUN 31 (H) 8 - 27 mg/dL   Creatinine, Ser 1.42 (H) 0.76 - 1.27 mg/dL   eGFR 54 (L) >59 mL/min/1.73   BUN/Creatinine Ratio 22 10 - 24   Sodium 138 134 - 144 mmol/L   Potassium 4.1 3.5 - 5.2 mmol/L   Chloride 97 96 - 106 mmol/L   CO2 24 20 - 29 mmol/L   Calcium 9.5 8.6 - 10.2 mg/dL   Total Protein 6.2 6.0 - 8.5 g/dL   Albumin 4.1 3.8 - 4.8 g/dL   Globulin, Total 2.1 1.5 - 4.5 g/dL   Albumin/Globulin Ratio 2.0 1.2 - 2.2   Bilirubin Total 0.8 0.0 - 1.2 mg/dL   Alkaline Phosphatase 108 44 - 121 IU/L   AST 26 0 - 40 IU/L   ALT 33 0 - 44 IU/L  Lipid panel  Result Value Ref Range   Cholesterol, Total 160 100 - 199 mg/dL   Triglycerides 117 0 - 149 mg/dL   HDL 60 >39 mg/dL   VLDL Cholesterol Cal 21 5 - 40 mg/dL   LDL Chol Calc (NIH) 79 0 - 99 mg/dL   Chol/HDL Ratio 2.7 0.0 - 5.0 ratio  Bayer DCA Hb A1c Waived  Result Value Ref Range   HB A1C (BAYER DCA - WAIVED) 5.6 4.8 - 5.6 %    Assessment & Plan:   Problem List Items Addressed This Visit       Cardiovascular and Mediastinum   Coronary artery disease - Primary   Chronic systolic heart failure (HCC)   Hypertension associated with diabetes (HCC)     Digestive   Acid reflux   Relevant Medications   omeprazole (PRILOSEC) 20 MG capsule      Endocrine   Hyperlipidemia associated with type 2 diabetes mellitus (HCC)   Controlled diabetes mellitus with stage 3 chronic kidney disease ([place])  Patient says weights been stable at home.  He has been keeping a track on them and currently taking torsemide.  He recently did have a small exacerbation but is able to clearly get it resolved in the ER.  He continues to follow closely with cardiology.  Follow up plan: Return in about 3 months (around 12/03/2021), or if symptoms worsen or fail to improve, for Diabetes hypertension and cholesterol.  Counseling provided for all of the vaccine components No orders of the defined types were placed in this encounter.   Caryl Pina, MD [facility] 09/02/2021, 9:27 AM

## 2021-09-04 ENCOUNTER — Other Ambulatory Visit (HOSPITAL_COMMUNITY): Payer: Self-pay | Admitting: Internal Medicine

## 2021-09-05 ENCOUNTER — Ambulatory Visit (INDEPENDENT_AMBULATORY_CARE_PROVIDER_SITE_OTHER): Payer: Medicare HMO

## 2021-09-05 DIAGNOSIS — I255 Ischemic cardiomyopathy: Secondary | ICD-10-CM

## 2021-09-07 LAB — CUP PACEART REMOTE DEVICE CHECK
Battery Remaining Longevity: 8 mo
Battery Remaining Percentage: 6 %
Battery Voltage: 2.63 V
Brady Statistic RV Percent Paced: 1 %
Date Time Interrogation Session: 20221010041809
HighPow Impedance: 49 Ohm
Implantable Lead Implant Date: 20110209
Implantable Lead Location: 753860
Implantable Lead Model: 7120
Implantable Pulse Generator Implant Date: 20110209
Lead Channel Impedance Value: 410 Ohm
Lead Channel Pacing Threshold Amplitude: 0.75 V
Lead Channel Pacing Threshold Pulse Width: 0.4 ms
Lead Channel Sensing Intrinsic Amplitude: 12 mV
Lead Channel Setting Pacing Amplitude: 2.5 V
Lead Channel Setting Pacing Pulse Width: 0.4 ms
Lead Channel Setting Sensing Sensitivity: 0.5 mV
Pulse Gen Serial Number: 747960

## 2021-09-14 NOTE — Addendum Note (Signed)
Addended by: Elease Etienne A on: 09/14/2021 09:18 AM   Modules accepted: Level of Service

## 2021-09-14 NOTE — Progress Notes (Signed)
Remote ICD transmission.   

## 2021-10-03 ENCOUNTER — Telehealth: Payer: Self-pay | Admitting: Cardiology

## 2021-10-03 ENCOUNTER — Ambulatory Visit (INDEPENDENT_AMBULATORY_CARE_PROVIDER_SITE_OTHER): Payer: Medicare HMO

## 2021-10-03 DIAGNOSIS — I255 Ischemic cardiomyopathy: Secondary | ICD-10-CM | POA: Diagnosis not present

## 2021-10-03 MED ORDER — TORSEMIDE 20 MG PO TABS: 20.0000 mg | ORAL_TABLET | ORAL | 11 refills | Status: AC

## 2021-10-03 NOTE — Telephone Encounter (Signed)
Refilled as requested  

## 2021-10-03 NOTE — Telephone Encounter (Signed)
*  STAT* If patient is at the pharmacy, call can be transferred to refill team.   1. Which medications need to be refilled? (please list name of each medication and dose if known) torsemide (DEMADEX) 20 MG tablet  2. Which pharmacy/location (including street and city if local pharmacy) is medication to be sent to? [id number] - [place], [place] - [address]  3. Do they need a 30 day or 90 day supply? 30 ds

## 2021-10-04 ENCOUNTER — Ambulatory Visit (INDEPENDENT_AMBULATORY_CARE_PROVIDER_SITE_OTHER): Payer: Medicare HMO | Admitting: Internal Medicine

## 2021-10-04 ENCOUNTER — Encounter: Payer: Self-pay | Admitting: Internal Medicine

## 2021-10-04 ENCOUNTER — Other Ambulatory Visit: Payer: Self-pay

## 2021-10-04 ENCOUNTER — Other Ambulatory Visit: Payer: Medicare HMO

## 2021-10-04 VITALS — BP 114/68 | HR 72 | Ht 71.0 in | Wt 180.4 lb

## 2021-10-04 DIAGNOSIS — N1831 Chronic kidney disease, stage 3a: Secondary | ICD-10-CM | POA: Diagnosis not present

## 2021-10-04 DIAGNOSIS — I5042 Chronic combined systolic (congestive) and diastolic (congestive) heart failure: Secondary | ICD-10-CM | POA: Diagnosis not present

## 2021-10-04 DIAGNOSIS — I5022 Chronic systolic (congestive) heart failure: Secondary | ICD-10-CM | POA: Diagnosis not present

## 2021-10-04 DIAGNOSIS — I129 Hypertensive chronic kidney disease with stage 1 through stage 4 chronic kidney disease, or unspecified chronic kidney disease: Secondary | ICD-10-CM | POA: Diagnosis not present

## 2021-10-04 DIAGNOSIS — I2510 Atherosclerotic heart disease of native coronary artery without angina pectoris: Secondary | ICD-10-CM

## 2021-10-04 NOTE — Progress Notes (Signed)
HPI Joseph [name] returns today for followup. He has an ICM, s/p MI, s/p ICD insertion. He recently underwent left and right heart cath with occluded native arteries and patent vein grafts/LIMA. He was noted to have some worsening distal LAD disease, downstream from his LIMA insertion. He has done well in the interim.  He denies chest pain. He denies sob except with significant exertion. No edema.  Allergies  Allergen Reactions   Entresto [Sacubitril-Valsartan]     Worsening renal function and weight gain     Current Outpatient Medications  Medication Sig Dispense Refill   acetaminophen (TYLENOL) 500 MG tablet Take 500 mg by mouth every 6 (six) hours as needed for pain.     albuterol (VENTOLIN HFA) 108 (90 Base) MCG/ACT inhaler Inhale 2 puffs into the lungs every 4 (four) hours as needed for wheezing or shortness of breath. 6.7 g 0   aspirin EC 81 MG tablet Take 81 mg by mouth every evening. Swallow whole.     atorvastatin (LIPITOR) 80 MG tablet Take 1 tablet (80 mg total) by mouth every evening. 90 tablet 3   enalapril (VASOTEC) 20 MG tablet Take 1 tablet (20 mg total) by mouth 2 (two) times daily. 180 tablet 3   fish oil-omega-3 fatty acids 1000 MG capsule Take 1 g by mouth 3 (three) times daily.     hydrOXYzine (VISTARIL) 25 MG capsule Take 1 capsule (25 mg total) by mouth 3 (three) times daily as needed. 30 capsule 0   JARDIANCE 10 MG TABS tablet TAKE 1 TABLET BY MOUTH DAILY BEFORE BREAKFAST. 30 tablet 4   metoprolol succinate (TOPROL-XL) 100 MG 24 hr tablet Take 1 tablet (100 mg total) by mouth daily. Take with or immediately following a meal. 90 tablet 3   Multiple Vitamin (MULTIVITAMIN WITH MINERALS) TABS tablet Take 1 tablet by mouth daily.     nitroGLYCERIN (NITROSTAT) 0.4 MG SL tablet Place 1 tablet (0.4 mg total) under the tongue every 5 (five) minutes as needed for chest pain. 25 tablet 3   omeprazole (PRILOSEC) 20 MG capsule Take 1 capsule (20 mg total) by mouth daily. 90  capsule 3   spironolactone (ALDACTONE) 25 MG tablet Take 0.5 tablets (12.5 mg total) by mouth daily. 45 tablet 3   torsemide (DEMADEX) 20 MG tablet Take 1 tablet (20 mg total) by mouth 2 (two) times a week. May take an extra for weight gain or edema 20 tablet 11   No current facility-administered medications for this visit.     Past Medical History:  Diagnosis Date   CHF (congestive heart failure) (HCC)    a. EF 25-30% in 2016 b. 35-40% by echo in 10/2017 c. 35-40% in 08/2020   COPD (chronic obstructive pulmonary disease) (HCC)    Coronary artery disease    a. s/p CABG in 2000 b. patent grafts in 2011 c. 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 Jude   Small bowel obstruction (HCC) 2007   resolved without surgery    ROS:   All systems reviewed and negative except as noted in the HPI.   Past  Surgical History:  Procedure Laterality Date   basilic vein left arm resection     CARDIAC DEFIBRILLATOR PLACEMENT     CORONARY ARTERY BYPASS GRAFT     6 vessels, 2000   INSERT / REPLACE / REMOVE PACEMAKER  01/05/10   ICD insertion/St. Jude single chamber   RIGHT/LEFT HEART CATH AND CORONARY/GRAFT ANGIOGRAPHY N/A 09/23/2020   Procedure: RIGHT/LEFT HEART CATH AND CORONARY/GRAFT ANGIOGRAPHY;  Surgeon: Tonny Bollman, MD;  Location: [facility] INVASIVE CV LAB;  Service: Cardiovascular;  Laterality: N/A;     Family History  Problem Relation Age of Onset   Heart attack Mother    Emphysema Father    Coronary artery disease Father        s/p cabg   Colon cancer Neg Hx      Social History   Socioeconomic History   Marital status: Married     Spouse name: Kathie Rhodes   Number of children: 3   Years of education: GED   Highest education level: Not on file  Occupational History   Occupation: Runner, 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  Tobacco Use   Smoking status: Former    Types: Cigarettes    Start date: 11/28/1967    Quit date: 02/29/1988    Years since quitting: 33.6   Smokeless tobacco: Never  Vaping Use   Vaping Use: Never used  Substance and Sexual Activity   Alcohol use: No    Alcohol/week: 0.0 standard drinks   Drug use: No   Sexual activity: Never  Other Topics Concern   Not on file  Social History Narrative   1 son died age 82-Lee's syndrome         Social Determinants of Health   Financial Resource Strain: Not on file  Food Insecurity: Not on file  Transportation Needs: Not on file  Physical Activity: Not on file  Stress: Not on file  Social Connections: Not on file  Intimate Partner Violence: Not on file     BP 114/68   Pulse 72   Ht 5\' 11"  (1.803 m)   Wt 180 lb 6.4 oz (81.8 kg)   SpO2 98%   BMI 25.16 kg/m   Physical Exam:  Well appearing 65 yo man, NAD HEENT: Unremarkable Neck:  No JVD, no thyromegally Lymphatics:  No adenopathy Back:  No CVA tenderness Lungs:  Clear with no wheezes HEART:  Regular rate rhythm, no murmurs, no rubs, no clicks Abd:  soft, positive bowel sounds, no organomegally, no rebound, no guarding Ext:  2 plus pulses, no edema, no cyanosis, no clubbing Skin:  No rashes no nodules Neuro:  CN II through XII intact, motor grossly intact  DEVICE  Normal device function.  See PaceArt for details.   Assess/Plan:  Chronic systolic heart failure - his symptoms are class 2. He will continue his current meds. ICD - His St. Jude device is working normally. CAD - his disease has progressed. He denies anginal symptoms.  HTN - his bp is well controlled. No change in his meds.   79 Mindy Behnken,MD

## 2021-10-04 NOTE — Patient Instructions (Signed)
Medication Instructions:  °Your physician recommends that you continue on your current medications as directed. Please refer to the Current Medication list given to you today. ° °*If you need a refill on your cardiac medications before your next appointment, please call your pharmacy* ° ° °Lab Work: °NONE  ° °If you have labs (blood work) drawn today and your tests are completely normal, you will receive your results only by: °• MyChart Message (if you have MyChart) OR °• A paper copy in the mail °If you have any lab test that is abnormal or we need to change your treatment, we will call you to review the results. ° ° °Testing/Procedures: °NONE  ° ° °Follow-Up: °At [facility], you and your health needs are our priority.  As part of our continuing mission to provide you with exceptional heart care, we have created designated Provider Care Teams.  These Care Teams include your primary Cardiologist (physician) and Advanced Practice Providers (APPs -  Physician Assistants and Nurse Practitioners) who all work together to provide you with the care you need, when you need it. ° °We recommend signing up for the patient portal called "MyChart".  Sign up information is provided on this After Visit Summary.  MyChart is used to connect with patients for Virtual Visits (Telemedicine).  Patients are able to view lab/test results, encounter notes, upcoming appointments, etc.  Non-urgent messages can be sent to your provider as well.   °To learn more about what you can do with MyChart, go to [email or web address].   ° °Your next appointment:   °9 month(s) ° °The format for your next appointment:   °In Person ° °Provider:   °Gregg Taylor, MD ° ° °Other Instructions °Thank you for choosing  [facility]! ° ° ° °

## 2021-10-06 LAB — CUP PACEART REMOTE DEVICE CHECK
Battery Remaining Longevity: 6 mo
Battery Remaining Percentage: 5 %
Battery Voltage: 2.63 V
Brady Statistic RV Percent Paced: 1 %
Date Time Interrogation Session: 20221110032758
HighPow Impedance: 50 Ohm
Implantable Lead Implant Date: 20110209
Implantable Lead Location: 753860
Implantable Lead Model: 7120
Implantable Pulse Generator Implant Date: 20110209
Lead Channel Impedance Value: 440 Ohm
Lead Channel Pacing Threshold Amplitude: 1.25 V
Lead Channel Pacing Threshold Pulse Width: 0.4 ms
Lead Channel Sensing Intrinsic Amplitude: 12 mV
Lead Channel Setting Pacing Amplitude: 2.5 V
Lead Channel Setting Pacing Pulse Width: 0.4 ms
Lead Channel Setting Sensing Sensitivity: 0.5 mV
Pulse Gen Serial Number: 747960

## 2021-10-07 ENCOUNTER — Ambulatory Visit: Payer: Medicare HMO | Admitting: Student

## 2021-10-07 ENCOUNTER — Other Ambulatory Visit: Payer: Self-pay

## 2021-10-07 ENCOUNTER — Encounter: Payer: Self-pay | Admitting: Student

## 2021-10-07 VITALS — BP 110/68 | HR 56 | Ht 71.0 in | Wt 177.0 lb

## 2021-10-07 DIAGNOSIS — Z95810 Presence of automatic (implantable) cardiac defibrillator: Secondary | ICD-10-CM | POA: Diagnosis not present

## 2021-10-07 DIAGNOSIS — N1832 Chronic kidney disease, stage 3b: Secondary | ICD-10-CM | POA: Diagnosis not present

## 2021-10-07 DIAGNOSIS — I10 Essential (primary) hypertension: Secondary | ICD-10-CM | POA: Diagnosis not present

## 2021-10-07 DIAGNOSIS — N1831 Chronic kidney disease, stage 3a: Secondary | ICD-10-CM | POA: Diagnosis not present

## 2021-10-07 DIAGNOSIS — I2510 Atherosclerotic heart disease of native coronary artery without angina pectoris: Secondary | ICD-10-CM

## 2021-10-07 DIAGNOSIS — I5042 Chronic combined systolic (congestive) and diastolic (congestive) heart failure: Secondary | ICD-10-CM

## 2021-10-07 DIAGNOSIS — E211 Secondary hyperparathyroidism, not elsewhere classified: Secondary | ICD-10-CM | POA: Diagnosis not present

## 2021-10-07 DIAGNOSIS — I129 Hypertensive chronic kidney disease with stage 1 through stage 4 chronic kidney disease, or unspecified chronic kidney disease: Secondary | ICD-10-CM | POA: Diagnosis not present

## 2021-10-07 NOTE — Patient Instructions (Addendum)
Medication Instructions:  Your physician recommends that you continue on your current medications as directed. Please refer to the Current Medication list given to you today.  *If you need a refill on your cardiac medications before your next appointment, please call your pharmacy*   Lab Work: NONE   If you have labs (blood work) drawn today and your tests are completely normal, you will receive your results only by: MyChart Message (if you have MyChart) OR A paper copy in the mail If you have any lab test that is abnormal or we need to change your treatment, we will call you to review the results.   Testing/Procedures: NONE    Follow-Up: At [facility], you and your health needs are our priority.  As part of our continuing mission to provide you with exceptional heart care, we have created designated Provider Care Teams.  These Care Teams include your primary Cardiologist (physician) and Advanced Practice Providers (APPs -  Physician Assistants and Nurse Practitioners) who all work together to provide you with the care you need, when you need it.  We recommend signing up for the patient portal called "MyChart".  Sign up information is provided on this After Visit Summary.  MyChart is used to connect with patients for Virtual Visits (Telemedicine).  Patients are able to view lab/test results, encounter notes, upcoming appointments, etc.  Non-urgent messages can be sent to your provider as well.   To learn more about what you can do with MyChart, go to [email or web address].    Your next appointment:   4 month(s)  The format for your next appointment:   In Person  Provider:   Nona Dell, MD or Randall An, PA-C    Other Instructions Thank you for choosing [facility]!

## 2021-10-07 NOTE — Progress Notes (Signed)
Remote ICD transmission.   

## 2021-10-07 NOTE — Progress Notes (Signed)
Cardiology Office Note    Date:  10/08/2021   ID:  Joseph [name], Joseph [name] July 28, 1956, MRN [id number]  PCP:  [name], Joseph Radon, MD  Cardiologist: Joseph Rotunda, MD --> The patient has requested to follow-up with Joseph [name] AHF: Joseph [name]  Chief Complaint  Patient presents with   Follow-up    3 month visit    History of Present Illness:    [name] is a 65 y.o. male with past medical history of CAD (s/p CABG in 2000, patent grafts by cath in 2011 and patent grafts by repeat cath in 08/2020), chronic combined systolic and diastolic CHF (EF 58-09% in 2016, at 35-40% by echo in 10/2017, 30-35% in 01/2019, and 35-40% by echo in 08/2020, s/p St. Jude ICD placement in 2011), HTN, HLD, Type 2 DM (diet-controlled) and Stage 3 CKD who presents to the office today for routine follow-up.   He was last examined by Joseph [name] in 07/2021 and denied any recent chest pain or dyspnea on exertion. His weight was averaging 178 lbs at home and was at 180 lbs on the office scales. Was continued on Torsemide 20mg  twice weekly along with Toprol-XL, Enalapril, Jardiance and Spironolactone. Was also evaluated by Dr. Ladona [name] on 10/04/2021 and his device was interrogated and functioning normally with battery estimated at 6.1 months.   In talking with the patient and his wife today, he reports his weight has been stable when checked at home and has been around 173 lbs this week. He does keep a very detailed log of his weight and vitals. He did have an episode of flash pulmonary edema in 07/2021 which prompted him to go to the ED but says he did not require admission. He does eat at local restaurants regularly and says he tries to order grilled items. Does not add salt to his food. No recent chest pain or palpitations.   Past Medical History:  Diagnosis Date   CHF (congestive heart failure) (HCC)    a. EF 25-30% in 2016 b. 35-40% by echo in 10/2017 c. 35-40% in 08/2020   COPD (chronic obstructive pulmonary  disease) (HCC)    Coronary artery disease    a. s/p CABG in 2000 b. patent grafts in 2011 c. 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 Jude   Small bowel obstruction (HCC) 2007   resolved without surgery    Past Surgical History:  Procedure Laterality Date   basilic vein left arm resection     CARDIAC DEFIBRILLATOR PLACEMENT     CORONARY ARTERY BYPASS GRAFT     6 vessels, 2000   INSERT / REPLACE / REMOVE PACEMAKER  01/05/10   ICD insertion/St. Jude single chamber   RIGHT/LEFT HEART CATH AND CORONARY/GRAFT ANGIOGRAPHY N/A 09/23/2020   Procedure: RIGHT/LEFT HEART CATH AND CORONARY/GRAFT ANGIOGRAPHY;  Surgeon: Joseph Bollman, MD;  Location: [facility] INVASIVE CV LAB;  Service: Cardiovascular;  Laterality: N/A;    Current Medications: Outpatient Medications Prior to Visit  Medication Sig Dispense Refill   acetaminophen (TYLENOL) 500 MG tablet Take 500 mg by mouth every 6 (six) hours as needed for pain.  albuterol (VENTOLIN HFA) 108 (90 Base) MCG/ACT inhaler Inhale 2 puffs into the lungs every 4 (four) hours as needed for wheezing or shortness of breath. 6.7 g 0   aspirin EC 81 MG tablet Take 81 mg by mouth every evening. Swallow whole.     atorvastatin (LIPITOR) 80 MG tablet Take 1 tablet (80 mg total) by mouth every evening. 90 tablet 3   enalapril (VASOTEC) 20 MG tablet Take 1 tablet (20 mg total) by mouth 2 (two) times daily. 180 tablet 3   fish oil-omega-3 fatty acids 1000 MG capsule Take 1 g by mouth 3 (three) times daily.     hydrOXYzine (VISTARIL) 25 MG capsule Take 1 capsule (25 mg total) by mouth 3 (three) times daily as needed. 30  capsule 0   JARDIANCE 10 MG TABS tablet TAKE 1 TABLET BY MOUTH DAILY BEFORE BREAKFAST. 30 tablet 4   metoprolol succinate (TOPROL-XL) 100 MG 24 hr tablet Take 1 tablet (100 mg total) by mouth daily. Take with or immediately following a meal. 90 tablet 3   Multiple Vitamin (MULTIVITAMIN WITH MINERALS) TABS tablet Take 1 tablet by mouth daily.     nitroGLYCERIN (NITROSTAT) 0.4 MG SL tablet Place 1 tablet (0.4 mg total) under the tongue every 5 (five) minutes as needed for chest pain. 25 tablet 3   omeprazole (PRILOSEC) 20 MG capsule Take 1 capsule (20 mg total) by mouth daily. 90 capsule 3   spironolactone (ALDACTONE) 25 MG tablet Take 0.5 tablets (12.5 mg total) by mouth daily. 45 tablet 3   torsemide (DEMADEX) 20 MG tablet Take 1 tablet (20 mg total) by mouth 2 (two) times a week. May take an extra for weight gain or edema 20 tablet 11   No facility-administered medications prior to visit.     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  Tobacco Use   Smoking status: Former    Types: Cigarettes    Start date: 11/28/1967    Quit date: 02/29/1988    Years since quitting: 33.6   Smokeless tobacco: Never  Vaping Use   Vaping Use: Never used  Substance and Sexual Activity   Alcohol use: No    Alcohol/week: 0.0 standard drinks   Drug use: No   Sexual activity: Never  Other Topics Concern   Not on file  Social History Narrative   1 son died age 45-Lee's syndrome         Social Determinants of Health   Financial Resource Strain: Not on file  Food Insecurity: Not on file  Transportation Needs: Not on file  Physical Activity: Not on file  Stress: Not on file  Social Connections: Not on file     Family History:  The patient's  family history includes Coronary artery disease in his father; Emphysema in his father; Heart attack in his mother.   Review of Systems:    Please see the history of present illness.     All other systems reviewed and are otherwise negative except as noted above.   Physical Exam:    VS:  BP 110/68   Pulse (!) 56   Ht 5\' 11"  (1.803 m)   Wt 177 lb (80.3 kg)  SpO2 96%   BMI 24.69 kg/m    General: Well developed, well nourished,male appearing in no acute distress. Head: Normocephalic, atraumatic. Neck: No carotid bruits. JVD not elevated.  Lungs: Respirations regular and unlabored, without wheezes or rales.  Heart: Regular rate and rhythm. No S3 or S4.  No murmur, no rubs, or gallops appreciated. Abdomen: Appears non-distended. No obvious abdominal masses. Msk:  Strength and tone appear normal for age. No obvious joint deformities or effusions. Extremities: No clubbing or cyanosis. 1+ pitting edema up to mid-shins bilaterally.  Distal pedal pulses are 2+ bilaterally. Neuro: Alert and oriented X 3. Moves all extremities spontaneously. No focal deficits noted. Psych:  Responds to questions appropriately with a normal affect. Skin: No rashes or lesions noted  Wt Readings from Last 3 Encounters:  10/07/21 177 lb (80.3 kg)  10/04/21 180 lb 6.4 oz (81.8 kg)  09/02/21 181 lb 6.4 oz (82.3 kg)     Studies/Labs Reviewed:   EKG:  EKG is not ordered today.    Recent Labs: 05/03/2021: B Natriuretic Peptide 101.0 08/30/2021: ALT 33; BUN 31; Creatinine, Ser 1.42; Hemoglobin 14.7; Platelets 272; Potassium 4.1; Sodium 138   Lipid Panel    Component Value Date/Time   CHOL 160 08/30/2021 0902   TRIG 117 08/30/2021 0902   HDL 60 08/30/2021 0902   CHOLHDL 2.7 08/30/2021 0902   CHOLHDL 3.2 03/08/2010 0411   VLDL 10 03/08/2010 0411   LDLCALC 79 08/30/2021 0902    Additional studies/ records that were reviewed today include:   R/LHC: 08/2020 1.  Severe native three-vessel coronary artery  disease with total occlusion of the mid LAD, mid circumflex, and distal RCA 2.  Status post aortocoronary bypass surgery with continued patency of the LIMA to LAD, sequential saphenous vein graft to OM1 and OM 2, saphenous vein graft to diagonal 1, and saphenous vein graft to PDA 3.  Diffuse mid and distal LAD stenosis beyond the LIMA insertion site, progressive from the previous catheterization 10 years ago 4.  Essentially normal right heart hemodynamics with normal filling pressures and preserved cardiac output   Recommendations: Medical therapy for heart failure and coronary artery disease.  I do not appreciate any targets for PCI.  The patient's bypass grafts all remain patent.  Echocardiogram: 08/2020  Summary    1. The left ventricle is mildly dilated in size with mildly increased wall  thickness.    2. The left ventricular systolic function is moderately decreased, LVEF is  visually estimated at 35-40%.    3. There is grade I diastolic dysfunction (impaired relaxation).    4. There is mild mitral valve regurgitation.    5. The left atrium is mildly to moderately dilated in size.    6. The right ventricle is normal in size, with normal systolic function.   Assessment:    1. Coronary artery disease involving native coronary artery of native heart without angina pectoris   2. Chronic combined systolic and diastolic heart failure ([place])   3. ICD (implantable cardioverter-defibrillator) in place   4. Essential hypertension   5. Stage 3b chronic kidney disease ([place])      Plan:   In order of problems listed above:  1. CAD - He is s/p CABG in 2000 and had patent grafts by most recent cath in 08/2020. He remains active at baseline and denies any recent anginal symptoms. Continue current medication regimen with ASA, Toprol-XL 100mg  daily and Atorvastatin 80mg  daily.  2. HFrEF - His EF was at  35-40% by echo in 08/2020 and he is scheduled for a repeat echo next year. He continues to  have episodes of flash pulmonary edema every few months and I suspect these episodes are triggered by increased sodium intake at those times as he does eat at restaurants routinely and we reviewed it is difficult to account for how much sodium he consumes at restaurants.  - Continue Torsemide 20mg  twice weekly and he is aware to take an extra tablet if needed. Previously had worsening renal function with daily dosing and with every other day dosing. Continue Jardiance 10mg  daily, Enalapril 20mg  BID, Toprol-XL 100mg  daily and Spironolactone 12.5mg  daily. Would not further titrate Spironolactone given his borderline BP when checked at home and due to his borderline hyperkalemia. He does consume No-Salt which is high in K+ and I encouraged him to limit the use of this.    3. ICD in Place - He is s/p St. Jude ICD placement in 2011 and most recent interrogation estimated 6 months of battery life. Being followed by Dr. Lovena Le with follow-up device check scheduled for next month.   4. HTN - His BP is at 110/68 during today's visit. Continue current medication regimen.   5. Stage 3 CKD - Followed by Dr. Theador Hawthorne. His creatinine was stable at 1.42 in 08/2021.    Medication Adjustments/Labs and Tests Ordered: Current medicines are reviewed at length with the patient today.  Concerns regarding medicines are outlined above.  Medication changes, Labs and Tests ordered today are listed in the Patient Instructions below. Patient Instructions  Medication Instructions:  Your physician recommends that you continue on your current medications as directed. Please refer to the Current Medication list given to you today.  *If you need a refill on your cardiac medications before your next appointment, please call your pharmacy*   Lab Work: NONE   If you have labs (blood work) drawn today and your tests are completely normal, you will receive your results only by: [place] (if you have MyChart) OR A paper  copy in the mail If you have any lab test that is abnormal or we need to change your treatment, we will call you to review the results.   Testing/Procedures: NONE    Follow-Up: At [facility], you and your health needs are our priority.  As part of our continuing mission to provide you with exceptional heart care, we have created designated Provider Care Teams.  These Care Teams include your primary Cardiologist (physician) and Advanced Practice Providers (APPs -  Physician Assistants and Nurse Practitioners) who all work together to provide you with the care you need, when you need it.  We recommend signing up for the patient portal called "MyChart".  Sign up information is provided on this After Visit Summary.  MyChart is used to connect with patients for Virtual Visits (Telemedicine).  Patients are able to view lab/test results, encounter notes, upcoming appointments, etc.  Non-urgent messages can be sent to your provider as well.   To learn more about what you can do with MyChart, go to [email or web address].    Your next appointment:   4 month(s)  The format for your next appointment:   In Person  Provider:   Rozann Lesches, MD or Bernerd Pho, PA-C    Other Instructions Thank you for choosing [facility]!     Signed, Erma Heritage, PA-C  10/08/2021 10:55 AM    [facility] [place] [address] [place], [place] [address]  Phone: [phone] Fax: [phone]

## 2021-10-08 ENCOUNTER — Encounter: Payer: Self-pay | Admitting: Student

## 2021-10-24 ENCOUNTER — Emergency Department (HOSPITAL_COMMUNITY): Payer: Medicare HMO

## 2021-10-24 ENCOUNTER — Encounter (HOSPITAL_COMMUNITY): Payer: Self-pay

## 2021-10-24 ENCOUNTER — Inpatient Hospital Stay (HOSPITAL_COMMUNITY)
Admission: EM | Admit: 2021-10-24 | Discharge: 2021-11-27 | DRG: 207 | Disposition: E | Payer: Medicare HMO | Attending: Pulmonary Disease | Admitting: Pulmonary Disease

## 2021-10-24 ENCOUNTER — Ambulatory Visit (INDEPENDENT_AMBULATORY_CARE_PROVIDER_SITE_OTHER): Payer: Medicare HMO | Admitting: Family

## 2021-10-24 ENCOUNTER — Encounter: Payer: Self-pay | Admitting: Family

## 2021-10-24 ENCOUNTER — Inpatient Hospital Stay (HOSPITAL_COMMUNITY): Payer: Medicare HMO

## 2021-10-24 DIAGNOSIS — I82611 Acute embolism and thrombosis of superficial veins of right upper extremity: Secondary | ICD-10-CM | POA: Diagnosis not present

## 2021-10-24 DIAGNOSIS — E1122 Type 2 diabetes mellitus with diabetic chronic kidney disease: Secondary | ICD-10-CM | POA: Diagnosis not present

## 2021-10-24 DIAGNOSIS — R509 Fever, unspecified: Secondary | ICD-10-CM | POA: Diagnosis not present

## 2021-10-24 DIAGNOSIS — J440 Chronic obstructive pulmonary disease with acute lower respiratory infection: Secondary | ICD-10-CM | POA: Diagnosis present

## 2021-10-24 DIAGNOSIS — J156 Pneumonia due to other aerobic Gram-negative bacteria: Secondary | ICD-10-CM

## 2021-10-24 DIAGNOSIS — R778 Other specified abnormalities of plasma proteins: Secondary | ICD-10-CM | POA: Diagnosis not present

## 2021-10-24 DIAGNOSIS — J9602 Acute respiratory failure with hypercapnia: Secondary | ICD-10-CM | POA: Diagnosis present

## 2021-10-24 DIAGNOSIS — Z87891 Personal history of nicotine dependence: Secondary | ICD-10-CM | POA: Diagnosis not present

## 2021-10-24 DIAGNOSIS — N1831 Chronic kidney disease, stage 3a: Secondary | ICD-10-CM | POA: Diagnosis not present

## 2021-10-24 DIAGNOSIS — A419 Sepsis, unspecified organism: Secondary | ICD-10-CM | POA: Diagnosis not present

## 2021-10-24 DIAGNOSIS — R042 Hemoptysis: Secondary | ICD-10-CM | POA: Diagnosis not present

## 2021-10-24 DIAGNOSIS — I5022 Chronic systolic (congestive) heart failure: Secondary | ICD-10-CM | POA: Diagnosis not present

## 2021-10-24 DIAGNOSIS — I248 Other forms of acute ischemic heart disease: Secondary | ICD-10-CM | POA: Diagnosis not present

## 2021-10-24 DIAGNOSIS — Z951 Presence of aortocoronary bypass graft: Secondary | ICD-10-CM | POA: Diagnosis not present

## 2021-10-24 DIAGNOSIS — J9621 Acute and chronic respiratory failure with hypoxia: Secondary | ICD-10-CM

## 2021-10-24 DIAGNOSIS — J90 Pleural effusion, not elsewhere classified: Secondary | ICD-10-CM | POA: Diagnosis not present

## 2021-10-24 DIAGNOSIS — Z9911 Dependence on respirator [ventilator] status: Secondary | ICD-10-CM

## 2021-10-24 DIAGNOSIS — I4891 Unspecified atrial fibrillation: Secondary | ICD-10-CM

## 2021-10-24 DIAGNOSIS — T380X5A Adverse effect of glucocorticoids and synthetic analogues, initial encounter: Secondary | ICD-10-CM | POA: Diagnosis not present

## 2021-10-24 DIAGNOSIS — Z1624 Resistance to multiple antibiotics: Secondary | ICD-10-CM | POA: Diagnosis present

## 2021-10-24 DIAGNOSIS — Z789 Other specified health status: Secondary | ICD-10-CM

## 2021-10-24 DIAGNOSIS — I255 Ischemic cardiomyopathy: Secondary | ICD-10-CM | POA: Diagnosis not present

## 2021-10-24 DIAGNOSIS — J811 Chronic pulmonary edema: Secondary | ICD-10-CM | POA: Diagnosis not present

## 2021-10-24 DIAGNOSIS — Z781 Physical restraint status: Secondary | ICD-10-CM

## 2021-10-24 DIAGNOSIS — E785 Hyperlipidemia, unspecified: Secondary | ICD-10-CM | POA: Diagnosis present

## 2021-10-24 DIAGNOSIS — R0603 Acute respiratory distress: Secondary | ICD-10-CM | POA: Diagnosis not present

## 2021-10-24 DIAGNOSIS — J101 Influenza due to other identified influenza virus with other respiratory manifestations: Secondary | ICD-10-CM | POA: Diagnosis not present

## 2021-10-24 DIAGNOSIS — L89151 Pressure ulcer of sacral region, stage 1: Secondary | ICD-10-CM | POA: Diagnosis not present

## 2021-10-24 DIAGNOSIS — I152 Hypertension secondary to endocrine disorders: Secondary | ICD-10-CM | POA: Diagnosis present

## 2021-10-24 DIAGNOSIS — R34 Anuria and oliguria: Secondary | ICD-10-CM | POA: Diagnosis not present

## 2021-10-24 DIAGNOSIS — Z888 Allergy status to other drugs, medicaments and biological substances status: Secondary | ICD-10-CM

## 2021-10-24 DIAGNOSIS — R0602 Shortness of breath: Secondary | ICD-10-CM

## 2021-10-24 DIAGNOSIS — Z20822 Contact with and (suspected) exposure to covid-19: Secondary | ICD-10-CM | POA: Diagnosis present

## 2021-10-24 DIAGNOSIS — J9601 Acute respiratory failure with hypoxia: Secondary | ICD-10-CM | POA: Diagnosis not present

## 2021-10-24 DIAGNOSIS — R069 Unspecified abnormalities of breathing: Secondary | ICD-10-CM | POA: Diagnosis not present

## 2021-10-24 DIAGNOSIS — J1000 Influenza due to other identified influenza virus with unspecified type of pneumonia: Secondary | ICD-10-CM | POA: Diagnosis not present

## 2021-10-24 DIAGNOSIS — R451 Restlessness and agitation: Secondary | ICD-10-CM | POA: Diagnosis not present

## 2021-10-24 DIAGNOSIS — J189 Pneumonia, unspecified organism: Secondary | ICD-10-CM | POA: Diagnosis not present

## 2021-10-24 DIAGNOSIS — E1169 Type 2 diabetes mellitus with other specified complication: Secondary | ICD-10-CM | POA: Diagnosis present

## 2021-10-24 DIAGNOSIS — N1832 Chronic kidney disease, stage 3b: Secondary | ICD-10-CM | POA: Diagnosis present

## 2021-10-24 DIAGNOSIS — I484 Atypical atrial flutter: Secondary | ICD-10-CM | POA: Diagnosis not present

## 2021-10-24 DIAGNOSIS — K219 Gastro-esophageal reflux disease without esophagitis: Secondary | ICD-10-CM | POA: Diagnosis present

## 2021-10-24 DIAGNOSIS — E871 Hypo-osmolality and hyponatremia: Secondary | ICD-10-CM | POA: Diagnosis present

## 2021-10-24 DIAGNOSIS — Z8701 Personal history of pneumonia (recurrent): Secondary | ICD-10-CM | POA: Diagnosis not present

## 2021-10-24 DIAGNOSIS — J80 Acute respiratory distress syndrome: Secondary | ICD-10-CM | POA: Diagnosis not present

## 2021-10-24 DIAGNOSIS — E874 Mixed disorder of acid-base balance: Secondary | ICD-10-CM | POA: Diagnosis not present

## 2021-10-24 DIAGNOSIS — I5043 Acute on chronic combined systolic (congestive) and diastolic (congestive) heart failure: Secondary | ICD-10-CM | POA: Diagnosis not present

## 2021-10-24 DIAGNOSIS — R918 Other nonspecific abnormal finding of lung field: Secondary | ICD-10-CM | POA: Diagnosis not present

## 2021-10-24 DIAGNOSIS — Z66 Do not resuscitate: Secondary | ICD-10-CM

## 2021-10-24 DIAGNOSIS — R0902 Hypoxemia: Secondary | ICD-10-CM | POA: Diagnosis not present

## 2021-10-24 DIAGNOSIS — Z2839 Other underimmunization status: Secondary | ICD-10-CM

## 2021-10-24 DIAGNOSIS — N2581 Secondary hyperparathyroidism of renal origin: Secondary | ICD-10-CM | POA: Diagnosis present

## 2021-10-24 DIAGNOSIS — Z4659 Encounter for fitting and adjustment of other gastrointestinal appliance and device: Secondary | ICD-10-CM

## 2021-10-24 DIAGNOSIS — E1165 Type 2 diabetes mellitus with hyperglycemia: Secondary | ICD-10-CM | POA: Diagnosis not present

## 2021-10-24 DIAGNOSIS — Z452 Encounter for adjustment and management of vascular access device: Secondary | ICD-10-CM

## 2021-10-24 DIAGNOSIS — G9341 Metabolic encephalopathy: Secondary | ICD-10-CM | POA: Diagnosis not present

## 2021-10-24 DIAGNOSIS — J1569 Pneumonia due to other gram-negative bacteria: Secondary | ICD-10-CM

## 2021-10-24 DIAGNOSIS — D631 Anemia in chronic kidney disease: Secondary | ICD-10-CM | POA: Diagnosis present

## 2021-10-24 DIAGNOSIS — D72829 Elevated white blood cell count, unspecified: Secondary | ICD-10-CM | POA: Diagnosis present

## 2021-10-24 DIAGNOSIS — M47816 Spondylosis without myelopathy or radiculopathy, lumbar region: Secondary | ICD-10-CM | POA: Diagnosis not present

## 2021-10-24 DIAGNOSIS — I471 Supraventricular tachycardia: Secondary | ICD-10-CM | POA: Diagnosis not present

## 2021-10-24 DIAGNOSIS — J9600 Acute respiratory failure, unspecified whether with hypoxia or hypercapnia: Secondary | ICD-10-CM

## 2021-10-24 DIAGNOSIS — Z79899 Other long term (current) drug therapy: Secondary | ICD-10-CM

## 2021-10-24 DIAGNOSIS — N179 Acute kidney failure, unspecified: Secondary | ICD-10-CM

## 2021-10-24 DIAGNOSIS — N183 Type 2 diabetes mellitus with diabetic chronic kidney disease: Secondary | ICD-10-CM | POA: Diagnosis present

## 2021-10-24 DIAGNOSIS — I517 Cardiomegaly: Secondary | ICD-10-CM | POA: Diagnosis not present

## 2021-10-24 DIAGNOSIS — R6521 Severe sepsis with septic shock: Secondary | ICD-10-CM

## 2021-10-24 DIAGNOSIS — Z6824 Body mass index (BMI) 24.0-24.9, adult: Secondary | ICD-10-CM

## 2021-10-24 DIAGNOSIS — E440 Moderate protein-calorie malnutrition: Secondary | ICD-10-CM | POA: Diagnosis not present

## 2021-10-24 DIAGNOSIS — I493 Ventricular premature depolarization: Secondary | ICD-10-CM | POA: Diagnosis present

## 2021-10-24 DIAGNOSIS — R578 Other shock: Secondary | ICD-10-CM | POA: Diagnosis not present

## 2021-10-24 DIAGNOSIS — Z95810 Presence of automatic (implantable) cardiac defibrillator: Secondary | ICD-10-CM | POA: Diagnosis present

## 2021-10-24 DIAGNOSIS — I4819 Other persistent atrial fibrillation: Secondary | ICD-10-CM | POA: Diagnosis not present

## 2021-10-24 DIAGNOSIS — Z7982 Long term (current) use of aspirin: Secondary | ICD-10-CM

## 2021-10-24 DIAGNOSIS — D6489 Other specified anemias: Secondary | ICD-10-CM | POA: Diagnosis present

## 2021-10-24 DIAGNOSIS — E7220 Disorder of urea cycle metabolism, unspecified: Secondary | ICD-10-CM | POA: Diagnosis not present

## 2021-10-24 DIAGNOSIS — J209 Acute bronchitis, unspecified: Secondary | ICD-10-CM | POA: Diagnosis not present

## 2021-10-24 DIAGNOSIS — R680 Hypothermia, not associated with low environmental temperature: Secondary | ICD-10-CM | POA: Diagnosis present

## 2021-10-24 DIAGNOSIS — J441 Chronic obstructive pulmonary disease with (acute) exacerbation: Secondary | ICD-10-CM | POA: Diagnosis present

## 2021-10-24 DIAGNOSIS — R079 Chest pain, unspecified: Secondary | ICD-10-CM | POA: Diagnosis not present

## 2021-10-24 DIAGNOSIS — J9690 Respiratory failure, unspecified, unspecified whether with hypoxia or hypercapnia: Secondary | ICD-10-CM | POA: Diagnosis not present

## 2021-10-24 DIAGNOSIS — I509 Heart failure, unspecified: Secondary | ICD-10-CM | POA: Diagnosis not present

## 2021-10-24 DIAGNOSIS — I2510 Atherosclerotic heart disease of native coronary artery without angina pectoris: Secondary | ICD-10-CM | POA: Diagnosis present

## 2021-10-24 DIAGNOSIS — I130 Hypertensive heart and chronic kidney disease with heart failure and stage 1 through stage 4 chronic kidney disease, or unspecified chronic kidney disease: Secondary | ICD-10-CM | POA: Diagnosis not present

## 2021-10-24 DIAGNOSIS — I472 Ventricular tachycardia, unspecified: Secondary | ICD-10-CM | POA: Diagnosis not present

## 2021-10-24 DIAGNOSIS — L8990 Pressure ulcer of unspecified site, unspecified stage: Secondary | ICD-10-CM | POA: Insufficient documentation

## 2021-10-24 DIAGNOSIS — E875 Hyperkalemia: Secondary | ICD-10-CM | POA: Diagnosis not present

## 2021-10-24 DIAGNOSIS — Z515 Encounter for palliative care: Secondary | ICD-10-CM | POA: Diagnosis not present

## 2021-10-24 DIAGNOSIS — J449 Chronic obstructive pulmonary disease, unspecified: Secondary | ICD-10-CM | POA: Diagnosis not present

## 2021-10-24 DIAGNOSIS — N170 Acute kidney failure with tubular necrosis: Secondary | ICD-10-CM | POA: Diagnosis not present

## 2021-10-24 DIAGNOSIS — Z8249 Family history of ischemic heart disease and other diseases of the circulatory system: Secondary | ICD-10-CM

## 2021-10-24 DIAGNOSIS — Z825 Family history of asthma and other chronic lower respiratory diseases: Secondary | ICD-10-CM

## 2021-10-24 DIAGNOSIS — Z743 Need for continuous supervision: Secondary | ICD-10-CM | POA: Diagnosis not present

## 2021-10-24 DIAGNOSIS — R000 Tachycardia, unspecified: Secondary | ICD-10-CM | POA: Diagnosis not present

## 2021-10-24 DIAGNOSIS — Z7984 Long term (current) use of oral hypoglycemic drugs: Secondary | ICD-10-CM

## 2021-10-24 DIAGNOSIS — R0689 Other abnormalities of breathing: Secondary | ICD-10-CM | POA: Diagnosis not present

## 2021-10-24 DIAGNOSIS — I340 Nonrheumatic mitral (valve) insufficiency: Secondary | ICD-10-CM | POA: Diagnosis present

## 2021-10-24 DIAGNOSIS — E8779 Other fluid overload: Secondary | ICD-10-CM | POA: Diagnosis not present

## 2021-10-24 DIAGNOSIS — E1159 Type 2 diabetes mellitus with other circulatory complications: Secondary | ICD-10-CM | POA: Diagnosis not present

## 2021-10-24 DIAGNOSIS — A498 Other bacterial infections of unspecified site: Secondary | ICD-10-CM

## 2021-10-24 DIAGNOSIS — Z978 Presence of other specified devices: Secondary | ICD-10-CM

## 2021-10-24 DIAGNOSIS — Z4682 Encounter for fitting and adjustment of non-vascular catheter: Secondary | ICD-10-CM | POA: Diagnosis not present

## 2021-10-24 DIAGNOSIS — I480 Paroxysmal atrial fibrillation: Secondary | ICD-10-CM | POA: Diagnosis not present

## 2021-10-24 LAB — CBC WITH DIFFERENTIAL/PLATELET
Abs Immature Granulocytes: 0.18 10*3/uL — ABNORMAL HIGH (ref 0.00–0.07)
Basophils Absolute: 0.1 10*3/uL (ref 0.0–0.1)
Basophils Relative: 1 %
Eosinophils Absolute: 0.2 10*3/uL (ref 0.0–0.5)
Eosinophils Relative: 1 %
HCT: 58.7 % — ABNORMAL HIGH (ref 39.0–52.0)
Hemoglobin: 17.9 g/dL — ABNORMAL HIGH (ref 13.0–17.0)
Immature Granulocytes: 2 %
Lymphocytes Relative: 38 %
Lymphs Abs: 4.5 10*3/uL — ABNORMAL HIGH (ref 0.7–4.0)
MCH: 29.2 pg (ref 26.0–34.0)
MCHC: 30.5 g/dL (ref 30.0–36.0)
MCV: 95.6 fL (ref 80.0–100.0)
Monocytes Absolute: 1.1 10*3/uL — ABNORMAL HIGH (ref 0.1–1.0)
Monocytes Relative: 9 %
Neutro Abs: 5.8 10*3/uL (ref 1.7–7.7)
Neutrophils Relative %: 49 %
Platelets: 186 10*3/uL (ref 150–400)
RBC: 6.14 MIL/uL — ABNORMAL HIGH (ref 4.22–5.81)
RDW: 13.5 % (ref 11.5–15.5)
WBC: 11.8 10*3/uL — ABNORMAL HIGH (ref 4.0–10.5)
nRBC: 0 % (ref 0.0–0.2)

## 2021-10-24 LAB — TROPONIN I (HIGH SENSITIVITY)
Troponin I (High Sensitivity): 17 ng/L (ref <18)
Troponin I (High Sensitivity): 175 ng/L (ref <18)

## 2021-10-24 LAB — COMPREHENSIVE METABOLIC PANEL
ALT: 36 U/L (ref 0–44)
AST: 40 U/L (ref 15–41)
Albumin: 4.1 g/dL (ref 3.5–5.0)
Alkaline Phosphatase: 121 U/L (ref 38–126)
Anion gap: 14 (ref 5–15)
BUN: 33 mg/dL — ABNORMAL HIGH (ref 8–23)
CO2: 17 mmol/L — ABNORMAL LOW (ref 22–32)
Calcium: 9.2 mg/dL (ref 8.9–10.3)
Chloride: 103 mmol/L (ref 98–111)
Creatinine, Ser: 2.01 mg/dL — ABNORMAL HIGH (ref 0.61–1.24)
GFR, Estimated: 36 mL/min — ABNORMAL LOW (ref >60)
Glucose, Bld: 199 mg/dL — ABNORMAL HIGH (ref 70–99)
Potassium: 4.3 mmol/L (ref 3.5–5.1)
Sodium: 134 mmol/L — ABNORMAL LOW (ref 135–145)
Total Bilirubin: 0.7 mg/dL (ref 0.3–1.2)
Total Protein: 8.3 g/dL — ABNORMAL HIGH (ref 6.5–8.1)

## 2021-10-24 LAB — BLOOD GAS, ARTERIAL
Acid-base deficit: 8.9 mmol/L — ABNORMAL HIGH (ref 0.0–2.0)
Acid-base deficit: 6.6 mmol/L — ABNORMAL HIGH (ref 0.0–2.0)
Bicarbonate: 15.3 mmol/L — ABNORMAL LOW (ref 20.0–28.0)
Bicarbonate: 19.8 mmol/L — ABNORMAL LOW (ref 20.0–28.0)
Drawn by: 41977
Drawn by: 35043
FIO2: 100
FIO2: 40
O2 Saturation: 98 %
O2 Saturation: 98 %
Patient temperature: 37
Patient temperature: 36.8
pCO2 arterial: 65.7 mmHg (ref 32.0–48.0)
pCO2 arterial: 27.2 mmHg — ABNORMAL LOW (ref 32.0–48.0)
pH, Arterial: 7.097 — CL (ref 7.350–7.450)
pH, Arterial: 7.414 (ref 7.350–7.450)
pO2, Arterial: 364 mmHg — ABNORMAL HIGH (ref 83.0–108.0)
pO2, Arterial: 160 mmHg — ABNORMAL HIGH (ref 83.0–108.0)

## 2021-10-24 LAB — LACTIC ACID, PLASMA
Lactic Acid, Venous: 4.5 mmol/L (ref 0.5–1.9)
Lactic Acid, Venous: 2.4 mmol/L (ref 0.5–1.9)

## 2021-10-24 LAB — RESP PANEL BY RT-PCR (FLU A&B, COVID) ARPGX2
Influenza A by PCR: POSITIVE — AB
Influenza B by PCR: NEGATIVE
SARS Coronavirus 2 by RT PCR: NEGATIVE

## 2021-10-24 LAB — BRAIN NATRIURETIC PEPTIDE: B Natriuretic Peptide: 243 pg/mL — ABNORMAL HIGH (ref 0.0–100.0)

## 2021-10-24 LAB — CBG MONITORING, ED: Glucose-Capillary: 178 mg/dL — ABNORMAL HIGH (ref 70–99)

## 2021-10-24 MED ORDER — SODIUM CHLORIDE 0.9 % IV SOLN
500.0000 mg | INTRAVENOUS | Status: DC
  Administered 2021-10-24: 19:00:00 500 mg via INTRAVENOUS
  Filled 2021-10-24: qty 500

## 2021-10-24 MED ORDER — OSELTAMIVIR PHOSPHATE 30 MG PO CAPS
30.0000 mg | ORAL_CAPSULE | Freq: Two times a day (BID) | ORAL | Status: DC
  Administered 2021-10-24 – 2021-10-28 (×9): 30 mg via ORAL
  Filled 2021-10-24 (×12): qty 1

## 2021-10-24 MED ORDER — SODIUM CHLORIDE 0.9 % IV SOLN
2.0000 g | Freq: Once | INTRAVENOUS | Status: AC
  Administered 2021-10-24: 19:00:00 2 g via INTRAVENOUS
  Filled 2021-10-24: qty 20

## 2021-10-24 MED ORDER — SODIUM CHLORIDE 0.9 % IV BOLUS
500.0000 mL | Freq: Once | INTRAVENOUS | Status: AC
  Administered 2021-10-24: 20:00:00 500 mL via INTRAVENOUS

## 2021-10-24 MED ORDER — PREDNISONE 10 MG (21) PO TBPK: ORAL_TABLET | ORAL | 0 refills | Status: AC

## 2021-10-24 MED ORDER — ALBUTEROL SULFATE (2.5 MG/3ML) 0.083% IN NEBU
5.0000 mg | INHALATION_SOLUTION | Freq: Once | RESPIRATORY_TRACT | Status: AC
  Administered 2021-10-24: 20:00:00 5 mg via RESPIRATORY_TRACT
  Filled 2021-10-24: qty 6

## 2021-10-24 MED ORDER — SODIUM CHLORIDE 0.9 % IV BOLUS
500.0000 mL | Freq: Once | INTRAVENOUS | Status: AC
  Administered 2021-10-24: 21:00:00 500 mL via INTRAVENOUS

## 2021-10-24 MED ORDER — ALBUTEROL SULFATE (2.5 MG/3ML) 0.083% IN NEBU
2.5000 mg | INHALATION_SOLUTION | Freq: Once | RESPIRATORY_TRACT | Status: AC
  Administered 2021-10-24: 19:00:00 2.5 mg via RESPIRATORY_TRACT
  Filled 2021-10-24: qty 3

## 2021-10-24 MED ORDER — METHYLPREDNISOLONE SODIUM SUCC 125 MG IJ SOLR
125.0000 mg | Freq: Once | INTRAMUSCULAR | Status: AC
  Administered 2021-10-24: 18:00:00 125 mg via INTRAVENOUS
  Filled 2021-10-24: qty 2

## 2021-10-24 MED ORDER — IPRATROPIUM-ALBUTEROL 0.5-2.5 (3) MG/3ML IN SOLN
3.0000 mL | Freq: Once | RESPIRATORY_TRACT | Status: AC
  Administered 2021-10-24: 19:00:00 3 mL via RESPIRATORY_TRACT
  Filled 2021-10-24: qty 3

## 2021-10-24 NOTE — ED Provider Notes (Signed)
[facility] EMERGENCY DEPARTMENT Provider Note   CSN: [id number] Arrival date & time: 10/06/2021  1810  LEVEL 5 CAVEAT - RESPIRATORY DISTRESS  History Chief Complaint  Patient presents with   Respiratory Distress    JANDIEL INGA is a 65 y.o. male.  HPI 65 year old male presents with respiratory distress.  History is primarily from the paramedics and a little bit from the patient.  He was in his car when an off-duty paramedic saw him tripoding and in respiratory distress.  He was eventually put on nasal cannula oxygen and then eventually CPAP.  He was hypoxic even on the nasal cannula at 6 L to mid 80s.  He endorses a cough for the last few days.  He denies fevers or chest pain.  Chronic leg swelling that is unchanged.  Past Medical History:  Diagnosis Date   CHF (congestive heart failure) ([place])    a. EF 25-30% in 2016 b. 35-40% by echo in 10/2017 c. 35-40% in 08/2020   COPD (chronic obstructive pulmonary disease) ([place])    Coronary artery disease    a. s/p CABG in 2000 b. patent grafts in 2011 c. 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 Jude   Small bowel obstruction ([place]) 2007   resolved without surgery    Patient Active Problem List   Diagnosis Date Noted   Acute respiratory failure with hypoxia and hypercapnia ([place]) 09/29/2021   Influenza A 10/23/2021   COPD with acute exacerbation ([place]) 10/23/2021   Acute-on-chronic kidney injury ([place]) 10/03/2021   Pneumonia 09/30/2021   Anemia 01/06/2019   Controlled diabetes mellitus with stage 3 chronic kidney disease ([place]) 08/15/2017   Hypertension associated with diabetes ([place])     Arteriosclerosis of coronary artery 08/05/2014   Acid reflux 08/05/2014   Hx of CABG [id number]   Chronic systolic heart failure ([place]) 05/02/2011   LAD stenosis    Coronary artery disease    Automatic implantable cardioverter-defibrillator in situ 01/12/2010   Other specified forms of chronic ischemic heart disease 11/03/2009   Cardiomyopathy, ischemic 11/03/2009   Hyperlipidemia associated with type 2 diabetes mellitus ([place]) 02/02/2009    Past Surgical History:  Procedure Laterality Date   basilic vein left arm resection     CARDIAC DEFIBRILLATOR PLACEMENT     CORONARY ARTERY BYPASS GRAFT     6 vessels, 2000   INSERT / REPLACE / REMOVE PACEMAKER  01/05/10   ICD insertion/St. Jude single chamber   RIGHT/LEFT HEART CATH AND CORONARY/GRAFT ANGIOGRAPHY N/A 09/23/2020   Procedure: RIGHT/LEFT HEART CATH AND CORONARY/GRAFT ANGIOGRAPHY;  Surgeon: Sherren Mocha, MD;  Location: [place] [facility];  Service: Cardiovascular;  Laterality: N/A;       Family History  Problem Relation Age of Onset   Heart attack Mother    Emphysema Father    Coronary artery disease Father        s/p cabg   Colon cancer Neg Hx     Social History   Tobacco Use   Smoking status: Former    Types: Cigarettes    Start date: 11/28/1967    Quit date: 02/29/1988  Years since quitting: 33.6   Smokeless tobacco: Never  Vaping Use   Vaping Use: Never used  Substance Use Topics   Alcohol use: No    Alcohol/week: 0.0 standard drinks   Drug use: No    Home Medications Prior to Admission medications   Medication Sig Start Date End Date Taking? Authorizing Provider  acetaminophen (TYLENOL) 500 MG tablet Take 500 mg by mouth every 6 (six) hours as needed for pain.   Yes [provider]  albuterol (VENTOLIN HFA) 108 (90 Base) MCG/ACT inhaler Inhale 2 puffs into the lungs every 4 (four) hours as needed for wheezing or shortness of breath. 09/08/20  Yes Dione Booze, MD  aspirin EC 81 MG tablet Take 81 mg  by mouth every evening. Swallow whole.   Yes [provider]  atorvastatin (LIPITOR) 80 MG tablet Take 1 tablet (80 mg total) by mouth every evening. 11/29/20  Yes [name], Elige Radon, MD  enalapril (VASOTEC) 20 MG tablet Take 1 tablet (20 mg total) by mouth 2 (two) times daily. 04/20/21  Yes Strader, [place] M, PA-C  fish oil-omega-3 fatty acids 1000 MG capsule Take 1 g by mouth 3 (three) times daily.   Yes [provider]  hydrOXYzine (VISTARIL) 25 MG capsule Take 1 capsule (25 mg total) by mouth 3 (three) times daily as needed. 09/13/20  Yes [name], Elige Radon, MD  ipratropium (ATROVENT) 0.02 % nebulizer solution Take 500 mcg by nebulization every 4 (four) hours as needed for shortness of breath. 08/01/21  Yes [provider]  JARDIANCE 10 MG TABS tablet TAKE 1 TABLET BY MOUTH DAILY BEFORE BREAKFAST. 09/05/21  Yes Bensimhon, Bevelyn Buckles, MD  metoprolol succinate (TOPROL-XL) 100 MG 24 hr tablet Take 1 tablet (100 mg total) by mouth daily. Take with or immediately following a meal. 09/14/20 07/29/22 Yes Strader, [place] M, PA-C  Multiple Vitamin (MULTIVITAMIN WITH MINERALS) TABS tablet Take 1 tablet by mouth daily.   Yes [provider]  nitroGLYCERIN (NITROSTAT) 0.4 MG SL tablet Place 1 tablet (0.4 mg total) under the tongue every 5 (five) minutes as needed for chest pain. 05/24/16  Yes Rollene Rotunda, MD  omeprazole (PRILOSEC) 20 MG capsule Take 1 capsule (20 mg total) by mouth daily. 09/02/21  Yes [name], Elige Radon, MD  spironolactone (ALDACTONE) 25 MG tablet Take 0.5 tablets (12.5 mg total) by mouth daily. 02/04/21 07/29/22 Yes Clegg, Amy D, NP  torsemide (DEMADEX) 20 MG tablet Take 1 tablet (20 mg total) by mouth 2 (two) times a week. May take an extra for weight gain or edema 10/03/21 01/01/22 Yes Bensimhon, Bevelyn Buckles, MD  albuterol (PROVENTIL) (2.5 MG/3ML) 0.083% nebulizer solution Take 2.5 mg by nebulization every 4 (four) hours as needed for shortness of breath. 08/11/21    [provider]  doxycycline (VIBRAMYCIN) 100 MG capsule Take 100 mg by mouth 2 (two) times daily. Patient not taking: Reported on Oct 27, 2021 08/24/21   [provider]  EPINEPHrine 0.3 mg/0.3 mL IJ SOAJ injection Inject into the muscle as directed. Patient not taking: Reported on 10-27-2021 08/09/21   [provider]  predniSONE (STERAPRED UNI-PAK 21 TAB) 10 MG (21) TBPK tablet Use as directed Patient not taking: Reported on 2021-10-27 2021/10/27   Junie Spencer, FNP    Allergies    Sherryll Burger [sacubitril-valsartan]  Review of Systems   Review of Systems  Unable to perform ROS: Severe respiratory distress   Physical Exam Updated Vital Signs BP 103/61   Pulse 72   Temp  98.2 F (36.8 C) (Oral)   Resp 17   Ht 5\' 11"  (1.803 m)   Wt 80.3 kg   SpO2 92%   BMI 24.69 kg/m   Physical Exam Vitals and nursing note reviewed.  Constitutional:      General: He is in acute distress.     Appearance: He is well-developed.  HENT:     Head: Normocephalic and atraumatic.     Right Ear: External ear normal.     Left Ear: External ear normal.     Nose: Nose normal.  Eyes:     General:        Right eye: No discharge.        Left eye: No discharge.  Cardiovascular:     Rate and Rhythm: Normal rate and regular rhythm.     Heart sounds: Normal heart sounds.  Pulmonary:     Effort: Accessory muscle usage and respiratory distress present.     Breath sounds: Wheezing and rales present.     Comments: On CPAP Abdominal:     Palpations: Abdomen is soft.     Tenderness: There is no abdominal tenderness.  Musculoskeletal:     Cervical back: Neck supple.     Right lower leg: No edema.     Left lower leg: No edema.  Skin:    General: Skin is warm and dry.  Neurological:     Mental Status: He is alert.  Psychiatric:        Mood and Affect: Mood is not anxious.    ED Results / Procedures / Treatments   Labs (all labs ordered are listed, but only abnormal results  are displayed) Labs Reviewed  RESP PANEL BY RT-PCR (FLU A&B, COVID) ARPGX2 - Abnormal; Notable for the following components:      Result Value   Influenza A by PCR POSITIVE (*)    All other components within normal limits  BLOOD GAS, ARTERIAL - Abnormal; Notable for the following components:   pH, Arterial 7.097 (*)    pCO2 arterial 65.7 (*)    pO2, Arterial 364 (*)    Bicarbonate 15.3 (*)    Acid-base deficit 8.9 (*)    All other components within normal limits  COMPREHENSIVE METABOLIC PANEL - Abnormal; Notable for the following components:   Sodium 134 (*)    CO2 17 (*)    Glucose, Bld 199 (*)    BUN 33 (*)    Creatinine, Ser 2.01 (*)    Total Protein 8.3 (*)    GFR, Estimated 36 (*)    All other components within normal limits  CBC WITH DIFFERENTIAL/PLATELET - Abnormal; Notable for the following components:   WBC 11.8 (*)    RBC 6.14 (*)    Hemoglobin 17.9 (*)    HCT 58.7 (*)    Lymphs Abs 4.5 (*)    Monocytes Absolute 1.1 (*)    Abs Immature Granulocytes 0.18 (*)    All other components within normal limits  BRAIN NATRIURETIC PEPTIDE - Abnormal; Notable for the following components:   B Natriuretic Peptide 243.0 (*)    All other components within normal limits  LACTIC ACID, PLASMA - Abnormal; Notable for the following components:   Lactic Acid, Venous 4.5 (*)    All other components within normal limits  LACTIC ACID, PLASMA - Abnormal; Notable for the following components:   Lactic Acid, Venous 2.4 (*)    All other components within normal limits  BLOOD GAS, ARTERIAL - Abnormal; Notable  for the following components:   pCO2 arterial 27.2 (*)    pO2, Arterial 160 (*)    Bicarbonate 19.8 (*)    Acid-base deficit 6.6 (*)    All other components within normal limits  CBG MONITORING, ED - Abnormal; Notable for the following components:   Glucose-Capillary 178 (*)    All other components within normal limits  TROPONIN I (HIGH SENSITIVITY) - Abnormal; Notable for the  following components:   Troponin I (High Sensitivity) 175 (*)    All other components within normal limits  CULTURE, BLOOD (ROUTINE X 2)  CULTURE, BLOOD (ROUTINE X 2)  PROCALCITONIN  TROPONIN I (HIGH SENSITIVITY)  TROPONIN I (HIGH SENSITIVITY)    EKG EKG Interpretation  Date/Time:  Monday October 24 2021 18:17:35 EST Ventricular Rate:  100 PR Interval:  144 QRS Duration: 142 QT Interval:  356 QTC Calculation: 460 R Axis:   -33 Text Interpretation: Sinus tachycardia Multiple ventricular premature complexes Biatrial enlargement Left bundle branch block Artifact in lead(s) II III aVL aVF V5 Confirmed by Sherwood Gambler [phone]) on 10/11/2021 6:56:55 PM  Radiology CT CHEST WO CONTRAST  Result Date: 10/02/2021 CLINICAL DATA:  Respiratory failure with hypoxia, hypercapnia, influenza infection and elevated creatinine. EXAM: CT CHEST WITHOUT CONTRAST TECHNIQUE: Multidetector CT imaging of the chest was performed following the standard protocol without IV contrast. COMPARISON:  Portable chest today, portable chest 08/23/2021, and CTA chest 03/12/2010. FINDINGS: Cardiovascular: Mild cardiomegaly with left chamber predominance is again noted with pacemaker wiring in the heart and left chest battery again causing streak artifact. Native coronary arteries are heavily calcified with again noted old CABG and healed sternotomy changes. There is no pericardial effusion. There is mild distention of the superior pulmonary veins compared to the prior CTA, and interval increased slight prominence of the pulmonary trunk measuring 3.1 cm suggesting arterial hypertension. There is patchy calcification in the aorta and great vessels but no aortic aneurysm. Mediastinum/Nodes: No enlarged mediastinal or axillary lymph nodes. Thyroid gland, trachea, and esophagus demonstrate no significant findings. There is a small to moderate hiatal hernia again noted however, which contains mostly fat. Lungs/Pleura: There are  patchy ground-glass opacities in the upper lobes which predominate in the peripheral lungs, nonlocalizing faint ground-glass opacities in the infrahilar lower lobes, and increased opacity in the posterior lower lobes alongside small to moderate-sized pleural effusions which could be atelectasis or consolidation. There is interstitial prominence in the lung apices and bases and in the lower zonal parahilar regions, with a distribution most suggestive of interstitial edema. The central airways show no focal filling defects but do show bronchial thickening in the lower lobes. Upper Abdomen: No acute abnormality.  Old cholecystectomy. Musculoskeletal: No chest wall mass or suspicious bone lesions identified. IMPRESSION: 1. Cardiomegaly with interstitial edema and small to moderate symmetric layering pleural effusions consistent with CHF or fluid overload. 2. Multifocal lung opacities. Opacities alongside the pleural effusions could be atelectasis or consolidation. Peripheral-predominant ground-glass opacities in the upper lobes are probably due to pneumonitis including viral etiologies. Ground-glass central opacities are likely ground-glass edema and there is bronchial thickening in both lower lobes. 3. Calcific CAD with remote CABG and pacing system. 4. Slightly prominent pulmonary trunk but no right heart chamber enlargement. 5. Follow-up study recommended after treatment to ensure clearing of opacities. Electronically Signed   By: Telford Nab M.D.   On: 10/12/2021 23:00   DG Chest Portable 1 View  Result Date: 10/04/2021 CLINICAL DATA:  Respiratory distress. EXAM: PORTABLE CHEST 1 VIEW  COMPARISON:  Chest x-rays dated 08/23/2021 and 05/03/2021. FINDINGS: Mildly increased interstitial markings bilaterally compared to the earlier exams, suggesting acute interstitial edema/thickening superimposed on chronic interstitial lung disease/fibrosis. No confluent opacity to suggest a consolidating pneumonia. No pleural  effusion or pneumothorax is seen. Heart size and mediastinal contours are stable. LEFT chest wall pacemaker/ICD apparatus appears stable in position. Osseous structures about the chest are unremarkable. IMPRESSION: New interstitial prominence bilaterally indicating mild volume overload versus multifocal atypical pneumonia, superimposed on chronic interstitial lung disease/fibrosis. Electronically Signed   By: Franki Cabot M.D.   On: 10/07/2021 19:08    Procedures .Critical Care Performed by: Sherwood Gambler, MD Authorized by: Sherwood Gambler, MD   Critical care provider statement:    Critical care time (minutes):  40   Critical care time was exclusive of:  Separately billable procedures and treating other patients   Critical care was necessary to treat or prevent imminent or life-threatening deterioration of the following conditions:  Respiratory failure   Critical care was time spent personally by me on the following activities:  Development of treatment plan with patient or surrogate, discussions with consultants, evaluation of patient's response to treatment, examination of patient, ordering and review of laboratory studies, ordering and review of radiographic studies, ordering and performing treatments and interventions, pulse oximetry, re-evaluation of patient's condition, review of old charts and ventilator management   Medications Ordered in ED Medications  azithromycin (ZITHROMAX) 500 mg in sodium chloride 0.9 % 250 mL IVPB (0 mg Intravenous Stopped 10/05/2021 2022)  oseltamivir (TAMIFLU) capsule 30 mg (30 mg Oral Given 10/20/2021 2100)  methylPREDNISolone sodium succinate (SOLU-MEDROL) 125 mg/2 mL injection 125 mg (125 mg Intravenous Given 10/17/2021 1821)  ipratropium-albuterol (DUONEB) 0.5-2.5 (3) MG/3ML nebulizer solution 3 mL (3 mLs Nebulization Given 10/19/2021 1838)  albuterol (PROVENTIL) (2.5 MG/3ML) 0.083% nebulizer solution 2.5 mg (2.5 mg Nebulization Given 10/05/2021 1838)  cefTRIAXone  (ROCEPHIN) 2 g in sodium chloride 0.9 % 100 mL IVPB (0 g Intravenous Stopped 10/07/2021 1945)  albuterol (PROVENTIL) (2.5 MG/3ML) 0.083% nebulizer solution 5 mg (5 mg Nebulization Given 10/21/2021 2001)  sodium chloride 0.9 % bolus 500 mL (0 mLs Intravenous Stopped 10/18/2021 2121)  sodium chloride 0.9 % bolus 500 mL (0 mLs Intravenous Stopped 10/17/2021 2213)    ED Course  I have reviewed the triage vital signs and the nursing notes.  Pertinent labs & imaging results that were available during my care of the patient were reviewed by me and considered in my medical decision making (see chart for details).    MDM Rules/Calculators/A&P                           Patient arrives in respiratory distress. He is much better after albuterol, solumedrol, and BiPAP. He is now resting easier. Did mildly drop BP, so was given small IVF bolus. Was given IV antibiotics originally, though with positive flu test perhaps this is all influenza pneumonia. Admit.  Final Clinical Impression(s) / ED Diagnoses Final diagnoses:  Acute on chronic respiratory failure with hypoxia ([place])  Influenza A    Rx / DC Orders ED Discharge Orders     None        Sherwood Gambler, MD 10/25/21 0050

## 2021-10-24 NOTE — H&P (Signed)
History and Physical    Joseph [name]:[id number] DOB: 1956/04/10 DOA: 10/02/2021  PCP: [name], Elige Radon, MD   Patient coming from: Home  I have personally briefly reviewed patient's old medical records in [facility]  Chief Complaint: Difficulty breathing  HPI: Joseph [name] is a 65 y.o. male with medical history significant for COPD, coronary artery disease, diabetes mellitus, hypertension, systolic CHF with ischemic cardiomyopathy, AICD. Patient was brought to the ED via EMS. An off duty paramedic saw patient reporting in his car in respiratory distress.  Per EMS, patient's O2 sats on 6 L was in the mid 80s. At the time of my evaluation, patient is on BiPAP, reports his breathing has improved.  Reports he started having productive cough 3 days ago.  The difficulty breathing started today.  He checks his weight regularly and his weight is unchanged.  He has chronic lower extremity swelling which is also unchanged.  No chest pain.  He is on torsemide twice weekly and compliant.  He did not get the influenza vaccine. Patient was brought to the ED on CPAP.  ED Course: Temp 98.2.  Respiratory rate-initially up to 36, improved to 16 on BiPAP.  Blood pressure 154/109 dropped to 80/53 on BiPAP.  O2 sats recorded at 88% on BiPAP improved to 100%.  WBC 11.8.  Troponin 17 > 175.  Lactic acid 4.5 > 2.4 improved before fluids were given.  ABG showed pH of 7.09, PCO2 of 65.  BNP 243.  Influenza positive. Chest x-ray-interstitial prominence suggesting mild volume overload versus multifocal atypical pneumonia superimposed on chronic interstitial lung disease/fibrosis. IV Ceftriaxone, azithromycin duo nebs 125 mg Solu-Medrol given.  Hospitalist admit for acute respiratory failure secondary to COPD, influenza/pneumonia.  Review of Systems: As per HPI all other systems reviewed and negative.  Past Medical History:  Diagnosis Date   CHF (congestive heart failure) (HCC)    a. EF 25-30% in 2016 b.  35-40% by echo in 10/2017 c. 35-40% in 08/2020   COPD (chronic obstructive pulmonary disease) (HCC)    Coronary artery disease    a. s/p CABG in 2000 b. patent grafts in 2011 c. 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 Jude   Small bowel obstruction (HCC) 2007   resolved without surgery    Past Surgical History:  Procedure Laterality Date   basilic vein left arm resection     CARDIAC DEFIBRILLATOR PLACEMENT     CORONARY ARTERY BYPASS GRAFT     6 vessels, 2000   INSERT / REPLACE / REMOVE PACEMAKER  01/05/10   ICD insertion/St. Jude single chamber   RIGHT/LEFT HEART CATH AND CORONARY/GRAFT ANGIOGRAPHY N/A 09/23/2020   Procedure: RIGHT/LEFT HEART CATH AND CORONARY/GRAFT ANGIOGRAPHY;  Surgeon: Tonny Bollman, MD;  Location: [facility] INVASIVE CV LAB;  Service: Cardiovascular;  Laterality: N/A;     reports that he quit smoking about 33 years ago. His smoking use included cigarettes. He started smoking about 53 years ago. He has never used smokeless tobacco. He reports that he does not drink alcohol and does not use drugs.  Allergies  Allergen Reactions   Entresto [Sacubitril-Valsartan]  Worsening renal function and weight gain    Family History  Problem Relation Age of Onset   Heart attack Mother    Emphysema Father    Coronary artery disease Father        s/p cabg   Colon cancer Neg Hx     Prior to Admission medications   Medication Sig Start Date End Date Taking? Authorizing Provider  acetaminophen (TYLENOL) 500 MG tablet Take 500 mg by mouth every 6 (six) hours as needed for pain.    [provider]  albuterol (VENTOLIN  HFA) 108 (90 Base) MCG/ACT inhaler Inhale 2 puffs into the lungs every 4 (four) hours as needed for wheezing or shortness of breath. [id number]   Delora Fuel, MD  aspirin EC 81 MG tablet Take 81 mg by mouth every evening. Swallow whole.    [provider]  atorvastatin (LIPITOR) 80 MG tablet Take 1 tablet (80 mg total) by mouth every evening. 11/29/20   [name], [name], MD  doxycycline (VIBRAMYCIN) 100 MG capsule Take 100 mg by mouth 2 (two) times daily. 08/24/21   [provider]  enalapril (VASOTEC) 20 MG tablet Take 1 tablet (20 mg total) by mouth 2 (two) times daily. 04/20/21   Strader, Fransisco Hertz, PA-C  EPINEPHrine 0.3 mg/0.3 mL IJ SOAJ injection Inject into the muscle as directed. 08/09/21   [provider]  fish oil-omega-3 fatty acids 1000 MG capsule Take 1 g by mouth 3 (three) times daily.    [provider]  hydrOXYzine (VISTARIL) 25 MG capsule Take 1 capsule (25 mg total) by mouth 3 (three) times daily as needed. 09/13/20   [name], [name], MD  ipratropium (ATROVENT) 0.02 % nebulizer solution SMARTSIG:2.5 Milliliter(s) Via Nebulizer 4 Times Daily 08/01/21   [provider]  JARDIANCE 10 MG TABS tablet TAKE 1 TABLET BY MOUTH DAILY BEFORE BREAKFAST. 09/05/21   Bensimhon, Shaune Pascal, MD  metoprolol succinate (TOPROL-XL) 100 MG 24 hr tablet Take 1 tablet (100 mg total) by mouth daily. Take with or immediately following a meal. 09/14/20 07/29/22  Strader, Fransisco Hertz, PA-C  Multiple Vitamin (MULTIVITAMIN WITH MINERALS) TABS tablet Take 1 tablet by mouth daily.    [provider]  nitroGLYCERIN (NITROSTAT) 0.4 MG SL tablet Place 1 tablet (0.4 mg total) under the tongue every 5 (five) minutes as needed for chest pain. 05/24/16   Minus Breeding, MD  omeprazole (PRILOSEC) 20 MG capsule Take 1 capsule (20 mg total) by mouth daily. 09/02/21   [name], [name], MD  predniSONE (STERAPRED UNI-PAK 21 TAB) 10 MG (21) TBPK tablet Use as directed 10/17/2021    Evelina Dun A, FNP  spironolactone (ALDACTONE) 25 MG tablet Take 0.5 tablets (12.5 mg total) by mouth daily. 02/04/21 07/29/22  Clegg, Amy D, NP  torsemide (DEMADEX) 20 MG tablet Take 1 tablet (20 mg total) by mouth 2 (two) times a week. May take an extra for weight gain or edema 10/03/21 01/01/22  Bensimhon, Shaune Pascal, MD    Physical Exam: Vitals:   10/08/2021 1838 10/18/2021 1842 10/26/2021 1933 10/21/2021 1935  BP:   (!) 88/45   Pulse:  (!) 107 97   Resp:  (!) 23 19   SpO2: 100% 100% 99%   Weight:    80.3 kg  Height:    5\' 11"  (1.803 m)    Constitutional: On BiPAP, Vitals:   10/21/2021 1838 10/19/2021 1842 10/14/2021 1933 10/19/2021 1935  BP:   (!) 88/45   Pulse:  (!) 107  97   Resp:  (!) 23 19   SpO2: 100% 100% 99%   Weight:    80.3 kg  Height:    5\' 11"  (1.803 m)   Eyes: PERRL, lids and conjunctivae normal ENMT: On BiPAP Neck: normal, supple, no masses, no thyromegaly Respiratory: Moderate increased work of breathing, use of abdominal accessory muscles, equal but reduced breath sounds bilaterally, crackles present.   Cardiovascular: Regular rate and rhythm, no murmurs / rubs / gallops.  Trace pitting extremity edema bilaterally, this is patient's baseline.  Lower extremities warm and well-perfused.  Abdomen: no tenderness, no masses palpated. No hepatosplenomegaly. Bowel sounds positive.  Musculoskeletal: no clubbing / cyanosis. No joint deformity upper and lower extremities. Good ROM, no contractures. Normal muscle tone.  Skin: no rashes, lesions, ulcers. No induration Neurologic: No apparent cranial nerve abnormality, moving extremities spontaneously. Psychiatric: Normal judgment and insight. Alert and oriented x 3. Normal mood.   Labs on Admission: I have personally reviewed following labs and imaging studies  CBC: Recent Labs  Lab 10/11/2021 1828  WBC 11.8*  NEUTROABS 5.8  HGB 17.9*  HCT 58.7*  MCV 95.6  PLT 99991111   Basic Metabolic Panel: Recent Labs  Lab 10/13/2021 1828  NA 134*   K 4.3  CL 103  CO2 17*  GLUCOSE 199*  BUN 33*  CREATININE 2.01*  CALCIUM 9.2   Liver Function Tests: Recent Labs  Lab 10/06/2021 1828  AST 40  ALT 36  ALKPHOS 121  BILITOT 0.7  PROT 8.3*  ALBUMIN 4.1   CBG: Recent Labs  Lab 10/23/2021 1814  GLUCAP 178*    Radiological Exams on Admission: DG Chest Portable 1 View  Result Date: 10/13/2021 CLINICAL DATA:  Respiratory distress. EXAM: PORTABLE CHEST 1 VIEW COMPARISON:  Chest x-rays dated 08/23/2021 and 05/03/2021. FINDINGS: Mildly increased interstitial markings bilaterally compared to the earlier exams, suggesting acute interstitial edema/thickening superimposed on chronic interstitial lung disease/fibrosis. No confluent opacity to suggest a consolidating pneumonia. No pleural effusion or pneumothorax is seen. Heart size and mediastinal contours are stable. LEFT chest wall pacemaker/ICD apparatus appears stable in position. Osseous structures about the chest are unremarkable. IMPRESSION: New interstitial prominence bilaterally indicating mild volume overload versus multifocal atypical pneumonia, superimposed on chronic interstitial lung disease/fibrosis. Electronically Signed   By: Franki Cabot M.D.   On: 09/29/2021 19:08    EKG: Independently reviewed.  Initial EKG with artifact.  Repeat EKG shows sinus rhythm rate 73, QTc prolonged 503.  QRS prolonged at 133.  PVCs.  Assessment/Plan Principal Problem:   Acute respiratory failure with hypoxia and hypercapnia (HCC) Active Problems:   Influenza A   COPD with acute exacerbation (HCC)   Pneumonia   Acute-on-chronic kidney injury ([place])   Cardiomyopathy, ischemic   Automatic implantable cardioverter-defibrillator in situ   Chronic systolic heart failure (HCC)   Hx of CABG   Hypertension associated with diabetes ([place])   Controlled diabetes mellitus with stage 3 chronic kidney disease ([place])   Acute respiratory failure with hypoxia and hypercapnia- O2 sats recorded here in the ED  88% initially on BiPAP improved to 100%.  ABG showed acidosis-7.09 likely combination of respiratory-with PCO2 of [id number], and metabolic acidosis from lactic acidosis.  Respiratory failure likely secondary to COPD exacerbation and pneumonia. - Despite his severe acidosis, his mental status is intact -Continue BiPAP -Follow-up ABG -CT suggesting pulmonary edema, but with hypotensive episode in the ED, diuresis has been held for now  COPD exacerbation with underlying chronic lung disease-presenting with productive  cough, dyspnea, reduced breath sounds on exam. -DuoNebs as needed and scheduled -Continue ceftriaxone and doxycycline (Qtc prolonged) -125 mg Solu-Medrol given, continue 60 twice daily  Multifocal pneumonia, influenza infection with SIRS -tachypnea-up to 36, leukocytosis of 11.8, tachycardia heart rate up to 107 and hypotension-blood pressure down to 80/53 chest x-ray shows focal atypical pneumonia versus mild volume overload superimposed on chronic interstitial lung disease/fibrosis.  BNP 243, mildly elevated compared to recent.  No peripheral signs of volume overload.  Did not receive flu vaccine. -Obtain procalcitonin -Continue IV ceftriaxone and azithromycin -Tamiflu - -Addendum obtain CT chest without contrast-CT showing interstitial edema and small to moderate layering pleural effusions consistent with CHF or fluid overload, also suggest pneumonitis of likely viral etiology, and bronchial thickening in the lower lobes. -With hypotensive episode in the ED, diuresis has been held for now  Hypotension-blood pressure down to 80/53.  Improved after 1 L bolus given in ED systolic now [id number]. -Hold off on further fluids for now -Hold enalapril, spironolactone, metoprolol  Lactic acidosis-4.5 > 2.4 before fluids were given.  Likely secondary to hypoxia.  Elevated troponin, CABG hx 2000- 17 > 175.  Likely demand ischemia.  He denies chest pain.  Follows with cardiologist Dr. Haroldine Laws and Dr.  Lovena Le -Trend troponin - Obtain updated echocardiogram -Resume Lipitor, aspirin -Cardiology consult in the morning  Prolonged QTc- 503.  AKI on CKD 3 A-creatinine 2, baseline about 1.4. -1 L bolus given, hold off on further fluids for now monitor.  Chronic systolic congestive heart failure, AICD-stable.  Last echo 2020 EF 30 to 35%.  BNP elevated at 243 compared to prior. No peripheral signs of volume overload.  Patient With a stable.  Required 1 L bolus due to hypotension in the ED with systolic in the [id number]. -Hold enalapril, spironolactone, metoprolol -On twice weekly torsemide 20 mg, held for now,  -Will need diuresis, but with episode of hypotension hold off for now.  DVT prophylaxis:  Heparin Code Status: Full code, confirmed with patient at bedside Family Communication: None at bedside Disposition Plan: > 2 days Consults called: None Admission status: Inpt, Stepdown I certify that at the point of admission it is my clinical judgment that the patient will require inpatient hospital care spanning beyond 2 midnights from the point of admission due to high intensity of service, high risk for further deterioration and high frequency of surveillance required.   Bethena Roys MD [facility]  10/12/2021, 11:24 PM

## 2021-10-24 NOTE — ED Notes (Signed)
Patient transported to CT 

## 2021-10-24 NOTE — Progress Notes (Signed)
Virtual Visit  Note Due to COVID-19 pandemic this visit was conducted virtually. This visit type was conducted due to national recommendations for restrictions regarding the COVID-19 Pandemic (e.g. social distancing, sheltering in place) in an effort to limit this patient's exposure and mitigate transmission in our community. All issues noted in this document were discussed and addressed.  A physical exam was not performed with this format.  I connected with Joseph [name] on 09/29/2021 at 1:47 pm  by telephone and verified that I am speaking with the correct person using two identifiers. Joseph [name] is currently located at home and wife is currently with him during visit. The provider, [name], FNP is located in their office at time of visit.  I discussed the limitations, risks, security and privacy concerns of performing an evaluation and management service by telephone and the availability of in person appointments. I also discussed with the patient that there may be a patient responsible charge related to this service. The patient expressed understanding and agreed to proceed.  [name], conchas are scheduled for a virtual visit with your provider today.    Just as we do with appointments in the office, we must obtain your consent to participate.  Your consent will be active for this visit and any virtual visit you may have with one of our providers in the next 365 days.    If you have a MyChart account, I can also send a copy of this consent to you electronically.  All virtual visits are billed to your insurance company just like a traditional visit in the office.  As this is a virtual visit, video technology does not allow for your provider to perform a traditional examination.  This may limit your provider's ability to fully assess your condition.  If your provider identifies any concerns that need to be evaluated in person or the need to arrange testing such as labs, EKG, etc, we will make  arrangements to do so.    Although advances in technology are sophisticated, we cannot ensure that it will always work on either your end or our end.  If the connection with a video visit is poor, we may have to switch to a telephone visit.  With either a video or telephone visit, we are not always able to ensure that we have a secure connection.   I need to obtain your verbal consent now.   Are you willing to proceed with your visit today?   [name] has provided verbal consent on 10/01/2021 for a virtual visit (video or telephone).   [name], [place] 10/23/2021  1:49 PM    History and Present Illness:  Cough This is a new problem. The current episode started in the past 7 days. The problem has been gradually worsening. The problem occurs every few minutes. The cough is Productive of sputum. Associated symptoms include nasal congestion, postnasal drip and wheezing. Pertinent negatives include no chills, ear congestion, ear pain, fever, headaches, sore throat or shortness of breath.     Review of Systems  Constitutional:  Negative for chills and fever.  HENT:  Positive for postnasal drip. Negative for ear pain and sore throat.   Respiratory:  Positive for cough and wheezing. Negative for shortness of breath.   Neurological:  Negative for headaches.    Observations/Objective: No SOB or distress noted   Assessment and Plan: 1. Acute bronchitis, unspecified organism Pt will take home COVID test to rule out - Take  meds as prescribed - Use a cool mist humidifier  -Use saline nose sprays frequently -Force fluids -For any cough or congestion  Use plain Mucinex- regular strength or max strength is fine -For fever or aces or pains- take tylenol or ibuprofen. -Throat lozenges if help -RTO if symptoms worsen or do not improve  - predniSONE (STERAPRED UNI-PAK 21 TAB) 10 MG (21) TBPK tablet; Use as directed  Dispense: 21 tablet; Refill: 0     I discussed the assessment and  treatment plan with the patient. The patient was provided an opportunity to ask questions and all were answered. The patient agreed with the plan and demonstrated an understanding of the instructions.   The patient was advised to call back or seek an in-person evaluation if the symptoms worsen or if the condition fails to improve as anticipated.  The above assessment and management plan was discussed with the patient. The patient verbalized understanding of and has agreed to the management plan. Patient is aware to call the clinic if symptoms persist or worsen. Patient is aware when to return to the clinic for a follow-up visit. Patient educated on when it is appropriate to go to the emergency department.   Time call ended:  1:58 pm   I provided 11 minutes of  non face-to-face time during this encounter.    [name], FNP

## 2021-10-24 NOTE — Progress Notes (Addendum)
Changed patient's rate on Bipap to 12 from 16; patient is now on 12/6, after reading ABG result.

## 2021-10-24 NOTE — Progress Notes (Signed)
Transported patient to and from CT without incident with patient on Bipap.

## 2021-10-24 NOTE — ED Triage Notes (Signed)
PER EMS: pt presents with SOB, diaphoresis. Pt arrives on cpap, rales in both upper and lower fields and only able to speak in short sentences. Denies CP. BP-150/90, HR-116

## 2021-10-24 NOTE — ED Notes (Signed)
Dr. Criss Alvine and respiratory at bedside upon patient arrival. Pt placed on bipap

## 2021-10-25 ENCOUNTER — Inpatient Hospital Stay (HOSPITAL_COMMUNITY): Payer: Medicare HMO

## 2021-10-25 ENCOUNTER — Other Ambulatory Visit: Payer: Self-pay

## 2021-10-25 DIAGNOSIS — R778 Other specified abnormalities of plasma proteins: Secondary | ICD-10-CM

## 2021-10-25 DIAGNOSIS — Z951 Presence of aortocoronary bypass graft: Secondary | ICD-10-CM

## 2021-10-25 DIAGNOSIS — J9601 Acute respiratory failure with hypoxia: Secondary | ICD-10-CM | POA: Diagnosis not present

## 2021-10-25 DIAGNOSIS — R079 Chest pain, unspecified: Secondary | ICD-10-CM

## 2021-10-25 DIAGNOSIS — I5022 Chronic systolic (congestive) heart failure: Secondary | ICD-10-CM

## 2021-10-25 DIAGNOSIS — I255 Ischemic cardiomyopathy: Secondary | ICD-10-CM | POA: Diagnosis not present

## 2021-10-25 DIAGNOSIS — N183 Chronic kidney disease, stage 3 unspecified: Secondary | ICD-10-CM

## 2021-10-25 DIAGNOSIS — N179 Acute kidney failure, unspecified: Secondary | ICD-10-CM | POA: Diagnosis not present

## 2021-10-25 DIAGNOSIS — E1122 Type 2 diabetes mellitus with diabetic chronic kidney disease: Secondary | ICD-10-CM

## 2021-10-25 DIAGNOSIS — L8990 Pressure ulcer of unspecified site, unspecified stage: Secondary | ICD-10-CM | POA: Insufficient documentation

## 2021-10-25 DIAGNOSIS — J9602 Acute respiratory failure with hypercapnia: Secondary | ICD-10-CM

## 2021-10-25 LAB — BLOOD GAS, ARTERIAL
Acid-base deficit: 7 mmol/L — ABNORMAL HIGH (ref 0.0–2.0)
Bicarbonate: 19.6 mmol/L — ABNORMAL LOW (ref 20.0–28.0)
Drawn by: 38235
FIO2: 35
O2 Saturation: 98 %
Patient temperature: 37
pCO2 arterial: 25.4 mmHg — ABNORMAL LOW (ref 32.0–48.0)
pH, Arterial: 7.429 (ref 7.350–7.450)
pO2, Arterial: 150 mmHg — ABNORMAL HIGH (ref 83.0–108.0)

## 2021-10-25 LAB — COMPREHENSIVE METABOLIC PANEL
ALT: 46 U/L — ABNORMAL HIGH (ref 0–44)
AST: 56 U/L — ABNORMAL HIGH (ref 15–41)
Albumin: 3 g/dL — ABNORMAL LOW (ref 3.5–5.0)
Alkaline Phosphatase: 103 U/L (ref 38–126)
Anion gap: 14 (ref 5–15)
BUN: 39 mg/dL — ABNORMAL HIGH (ref 8–23)
CO2: 14 mmol/L — ABNORMAL LOW (ref 22–32)
Calcium: 8.1 mg/dL — ABNORMAL LOW (ref 8.9–10.3)
Chloride: 104 mmol/L (ref 98–111)
Creatinine, Ser: 2.3 mg/dL — ABNORMAL HIGH (ref 0.61–1.24)
GFR, Estimated: 30 mL/min — ABNORMAL LOW (ref >60)
Glucose, Bld: 386 mg/dL — ABNORMAL HIGH (ref 70–99)
Potassium: 5.1 mmol/L (ref 3.5–5.1)
Sodium: 132 mmol/L — ABNORMAL LOW (ref 135–145)
Total Bilirubin: 0.4 mg/dL (ref 0.3–1.2)
Total Protein: 6.7 g/dL (ref 6.5–8.1)

## 2021-10-25 LAB — MAGNESIUM
Magnesium: 1.9 mg/dL (ref 1.7–2.4)
Magnesium: 2.3 mg/dL (ref 1.7–2.4)

## 2021-10-25 LAB — ECHOCARDIOGRAM COMPLETE
Area-P 1/2: 2.83 cm2
Calc EF: 43 %
Height: 71 in
MV M vel: 3.83 m/s
MV Peak grad: 58.7 mmHg
MV VTI: 1.71 cm2
Radius: 0.5 cm
S' Lateral: 2.91 cm
Single Plane A2C EF: 47.3 %
Single Plane A4C EF: 35.5 %
Weight: 2839.52 oz

## 2021-10-25 LAB — GLUCOSE, CAPILLARY
Glucose-Capillary: 112 mg/dL — ABNORMAL HIGH (ref 70–99)
Glucose-Capillary: 115 mg/dL — ABNORMAL HIGH (ref 70–99)
Glucose-Capillary: 125 mg/dL — ABNORMAL HIGH (ref 70–99)
Glucose-Capillary: 184 mg/dL — ABNORMAL HIGH (ref 70–99)
Glucose-Capillary: 247 mg/dL — ABNORMAL HIGH (ref 70–99)
Glucose-Capillary: 233 mg/dL — ABNORMAL HIGH (ref 70–99)

## 2021-10-25 LAB — CBC
HCT: 42.4 % (ref 39.0–52.0)
HCT: 50.7 % (ref 39.0–52.0)
Hemoglobin: 14.2 g/dL (ref 13.0–17.0)
Hemoglobin: 16.2 g/dL (ref 13.0–17.0)
MCH: 30.1 pg (ref 26.0–34.0)
MCH: 29.6 pg (ref 26.0–34.0)
MCHC: 33.5 g/dL (ref 30.0–36.0)
MCHC: 32 g/dL (ref 30.0–36.0)
MCV: 90 fL (ref 80.0–100.0)
MCV: 92.5 fL (ref 80.0–100.0)
Platelets: 166 10*3/uL (ref 150–400)
Platelets: 292 10*3/uL (ref 150–400)
RBC: 4.71 MIL/uL (ref 4.22–5.81)
RBC: 5.48 MIL/uL (ref 4.22–5.81)
RDW: 13.3 % (ref 11.5–15.5)
RDW: 13.3 % (ref 11.5–15.5)
WBC: 12 10*3/uL — ABNORMAL HIGH (ref 4.0–10.5)
WBC: 17.1 10*3/uL — ABNORMAL HIGH (ref 4.0–10.5)
nRBC: 0 % (ref 0.0–0.2)
nRBC: 0 % (ref 0.0–0.2)

## 2021-10-25 LAB — D-DIMER, QUANTITATIVE: D-Dimer, Quant: 2.3 ug/mL-FEU — ABNORMAL HIGH (ref 0.00–0.50)

## 2021-10-25 LAB — BASIC METABOLIC PANEL
Anion gap: 13 (ref 5–15)
BUN: 34 mg/dL — ABNORMAL HIGH (ref 8–23)
CO2: 18 mmol/L — ABNORMAL LOW (ref 22–32)
Calcium: 8.1 mg/dL — ABNORMAL LOW (ref 8.9–10.3)
Chloride: 106 mmol/L (ref 98–111)
Creatinine, Ser: 1.8 mg/dL — ABNORMAL HIGH (ref 0.61–1.24)
GFR, Estimated: 41 mL/min — ABNORMAL LOW (ref >60)
Glucose, Bld: 127 mg/dL — ABNORMAL HIGH (ref 70–99)
Potassium: 4.4 mmol/L (ref 3.5–5.1)
Sodium: 137 mmol/L (ref 135–145)

## 2021-10-25 LAB — PROCALCITONIN: Procalcitonin: 1.16 ng/mL

## 2021-10-25 LAB — TROPONIN I (HIGH SENSITIVITY)
Troponin I (High Sensitivity): 426 ng/L (ref <18)
Troponin I (High Sensitivity): 466 ng/L (ref <18)
Troponin I (High Sensitivity): 365 ng/L (ref <18)
Troponin I (High Sensitivity): 281 ng/L (ref <18)

## 2021-10-25 LAB — HIV ANTIBODY (ROUTINE TESTING W REFLEX): HIV Screen 4th Generation wRfx: NONREACTIVE

## 2021-10-25 LAB — MRSA NEXT GEN BY PCR, NASAL: MRSA by PCR Next Gen: NOT DETECTED

## 2021-10-25 LAB — BRAIN NATRIURETIC PEPTIDE: B Natriuretic Peptide: 383 pg/mL — ABNORMAL HIGH (ref 0.0–100.0)

## 2021-10-25 MED ORDER — SODIUM CHLORIDE 0.9 % IV SOLN
100.0000 mg | Freq: Two times a day (BID) | INTRAVENOUS | Status: DC
  Administered 2021-10-25 – 2021-10-28 (×6): 100 mg via INTRAVENOUS
  Filled 2021-10-25 (×12): qty 100

## 2021-10-25 MED ORDER — METOPROLOL TARTRATE 5 MG/5ML IV SOLN
5.0000 mg | INTRAVENOUS | Status: AC
  Administered 2021-10-25: 5 mg via INTRAVENOUS

## 2021-10-25 MED ORDER — PREDNISONE 20 MG PO TABS
40.0000 mg | ORAL_TABLET | Freq: Every day | ORAL | Status: DC
  Administered 2021-10-26: 40 mg via ORAL
  Filled 2021-10-25: qty 2

## 2021-10-25 MED ORDER — ATORVASTATIN CALCIUM 80 MG PO TABS
80.0000 mg | ORAL_TABLET | Freq: Every evening | ORAL | Status: DC
  Administered 2021-10-25 – 2021-10-28 (×4): 80 mg via ORAL
  Filled 2021-10-25: qty 1
  Filled 2021-10-25 (×2): qty 2

## 2021-10-25 MED ORDER — AMIODARONE HCL IN DEXTROSE 360-4.14 MG/200ML-% IV SOLN: 60.0000 mg/h | INTRAVENOUS | Status: DC

## 2021-10-25 MED ORDER — AMIODARONE IV BOLUS ONLY 150 MG/100ML
150.0000 mg | Freq: Once | INTRAVENOUS | Status: AC
  Administered 2021-10-25: 150 mg via INTRAVENOUS

## 2021-10-25 MED ORDER — IPRATROPIUM-ALBUTEROL 0.5-2.5 (3) MG/3ML IN SOLN
3.0000 mL | Freq: Four times a day (QID) | RESPIRATORY_TRACT | Status: AC
  Administered 2021-10-25 – 2021-10-26 (×5): 3 mL via RESPIRATORY_TRACT
  Filled 2021-10-25 (×2): qty 3

## 2021-10-25 MED ORDER — CHLORHEXIDINE GLUCONATE CLOTH 2 % EX PADS
6.0000 | MEDICATED_PAD | Freq: Every day | CUTANEOUS | Status: DC
  Administered 2021-10-25 – 2021-11-05 (×11): 6 via TOPICAL

## 2021-10-25 MED ORDER — HEPARIN SODIUM (PORCINE) 5000 UNIT/ML IJ SOLN
5000.0000 [IU] | Freq: Three times a day (TID) | INTRAMUSCULAR | Status: DC
  Administered 2021-10-25 (×2): 5000 [IU] via SUBCUTANEOUS

## 2021-10-25 MED ORDER — ENOXAPARIN SODIUM 80 MG/0.8ML IJ SOSY
1.0000 mg/kg | PREFILLED_SYRINGE | Freq: Two times a day (BID) | INTRAMUSCULAR | Status: DC
Start: 2021-10-25 — End: 2021-10-28
  Administered 2021-10-25 – 2021-10-27 (×4): 80 mg via SUBCUTANEOUS
  Filled 2021-10-25 (×5): qty 0.8

## 2021-10-25 MED ORDER — ASPIRIN EC 81 MG PO TBEC
81.0000 mg | DELAYED_RELEASE_TABLET | Freq: Every evening | ORAL | Status: DC
  Administered 2021-10-25 – 2021-10-28 (×4): 81 mg via ORAL
  Filled 2021-10-25 (×3): qty 1

## 2021-10-25 MED ORDER — INSULIN ASPART 100 UNIT/ML IJ SOLN
0.0000 [IU] | Freq: Three times a day (TID) | INTRAMUSCULAR | Status: DC
  Administered 2021-10-25: 1 [IU] via SUBCUTANEOUS
  Administered 2021-10-25 – 2021-10-27 (×5): 2 [IU] via SUBCUTANEOUS
  Administered 2021-10-27 – 2021-10-28 (×3): 1 [IU] via SUBCUTANEOUS
  Administered 2021-10-28: 3 [IU] via SUBCUTANEOUS

## 2021-10-25 MED ORDER — AMIODARONE HCL IN DEXTROSE 360-4.14 MG/200ML-% IV SOLN: 30.0000 mg/h | INTRAVENOUS | Status: DC

## 2021-10-25 MED ORDER — ALBUTEROL SULFATE (2.5 MG/3ML) 0.083% IN NEBU
2.5000 mg | INHALATION_SOLUTION | RESPIRATORY_TRACT | Status: DC | PRN
  Administered 2021-10-29 – 2021-11-05 (×2): 2.5 mg via RESPIRATORY_TRACT
  Filled 2021-10-25 (×3): qty 3

## 2021-10-25 MED ORDER — DILTIAZEM LOAD VIA INFUSION
10.0000 mg | Freq: Once | INTRAVENOUS | Status: AC
  Administered 2021-10-25: 10 mg via INTRAVENOUS
  Filled 2021-10-25: qty 10

## 2021-10-25 MED ORDER — INSULIN ASPART 100 UNIT/ML IJ SOLN
0.0000 [IU] | Freq: Every day | INTRAMUSCULAR | Status: DC
  Administered 2021-10-25: 3 [IU] via SUBCUTANEOUS

## 2021-10-25 MED ORDER — ORAL CARE MOUTH RINSE
15.0000 mL | Freq: Two times a day (BID) | OROMUCOSAL | Status: DC
  Administered 2021-10-25 – 2021-10-28 (×8): 15 mL via OROMUCOSAL

## 2021-10-25 MED ORDER — POLYETHYLENE GLYCOL 3350 17 G PO PACK: 17.0000 g | PACK | Freq: Every day | ORAL | Status: DC | PRN

## 2021-10-25 MED ORDER — METHYLPREDNISOLONE SODIUM SUCC 125 MG IJ SOLR
60.0000 mg | Freq: Two times a day (BID) | INTRAMUSCULAR | Status: AC
  Administered 2021-10-25 (×2): 60 mg via INTRAVENOUS
  Filled 2021-10-25: qty 0.96

## 2021-10-25 MED ORDER — DILTIAZEM HCL-DEXTROSE 125-5 MG/125ML-% IV SOLN (PREMIX)
5.0000 mg/h | INTRAVENOUS | Status: DC
  Administered 2021-10-25: 5 mg/h via INTRAVENOUS

## 2021-10-25 MED ORDER — ADENOSINE 6 MG/2ML IV SOLN
6.0000 mg | Freq: Once | INTRAVENOUS | Status: AC
  Administered 2021-10-25: 6 mg via INTRAVENOUS

## 2021-10-25 MED ORDER — SODIUM CHLORIDE 0.9 % IV SOLN
6.2500 mg | Freq: Three times a day (TID) | INTRAVENOUS | Status: DC | PRN
  Filled 2021-10-25: qty 0.25

## 2021-10-25 MED ORDER — HEPARIN SODIUM (PORCINE) 5000 UNIT/ML IJ SOLN
INTRAMUSCULAR | Status: AC
  Filled 2021-10-25: qty 1

## 2021-10-25 MED ORDER — SODIUM CHLORIDE 0.9 % IV SOLN
2.0000 g | INTRAVENOUS | Status: DC
  Administered 2021-10-25 – 2021-10-27 (×3): 2 g via INTRAVENOUS
  Filled 2021-10-25 (×2): qty 20

## 2021-10-25 MED ORDER — FUROSEMIDE 10 MG/ML IJ SOLN
40.0000 mg | Freq: Once | INTRAMUSCULAR | Status: AC
  Administered 2021-10-25: 40 mg via INTRAVENOUS

## 2021-10-25 MED ORDER — ACETAMINOPHEN 650 MG RE SUPP: 650.0000 mg | Freq: Four times a day (QID) | RECTAL | Status: DC | PRN

## 2021-10-25 MED ORDER — ADENOSINE 6 MG/2ML IV SOLN
12.0000 mg | Freq: Once | INTRAVENOUS | Status: AC
  Administered 2021-10-25: 12 mg via INTRAVENOUS

## 2021-10-25 MED ORDER — ACETAMINOPHEN 325 MG PO TABS: 650.0000 mg | ORAL_TABLET | Freq: Four times a day (QID) | ORAL | Status: DC | PRN

## 2021-10-25 MED ORDER — DILTIAZEM HCL 25 MG/5ML IV SOLN
15.0000 mg | Freq: Once | INTRAVENOUS | Status: AC
  Administered 2021-10-25: 15 mg via INTRAVENOUS

## 2021-10-25 NOTE — Progress Notes (Signed)
At around 1824, patient telemetry noted to be sinus tachy with heart rate in the 120-140's. Patient was just put back to bed from chair, alert and oriented x4, sitting up in bed. Patient stated he felt anxious that what happened last night will happen again. Unable calm patient, patient stated he was starting to feel tightness of chest and short of breath and skin clammy (sweaty) to touch. O2 sat 92-97% on 2L nasal cannula at the time. Patient coughing pink to light red tinged sputum. Dr Kerry Hough made aware of all symptoms. Bipap placed on patient per patient request and Dr Kerry Hough.  EKG done, Dr Kerry Hough in room with patient and Stat meds ordered and given per verbal from Dr Kerry Hough.

## 2021-10-25 NOTE — Consult Note (Addendum)
Cardiology Consultation:   Patient ID: ADARIUS ORDERS MRN: [id number]; DOB: 1956-03-07  Admit date: 10/06/2021 Date of Consult: 10/25/2021  PCP:  [name], Elige Radon, MD   [facility] Providers Cardiologist:  Rollene Rotunda, MD  AHF: Dr. Gala Romney Electrophysiologist:  Lewayne Bunting   Patient Profile:   Joseph [name] is a 65 y.o. male with a hx of CAD (s/p CABG in 2000, patent grafts by cath in 2011 and patent grafts by repeat cath in 08/2020), HFrEF (EF 25-30% in 2016, at 35-40% by echo in 10/2017, 30-35% in 01/2019, and 35-40% by echo in 08/2020, s/p St. Jude ICD placement in 2011), HTN, HLD, Type 2 DM (diet-controlled) and Stage 3 CKD who is being seen 10/25/2021 for the evaluation of elevated troponin values and CHF at the request of Dr. Mariea Clonts.  History of Present Illness:   [name] was recently examined by myself on 10/07/2021 and was overall doing well at that time and his weight has been stable around 173 lbs on his home scales.  He was consuming food at [employer] routinely.  He was continued on his current cardiac medications including ASA, Toprol-XL 100 mg daily, Atorvastatin 80 mg daily, Torsemide 20mg  x2 weekly, Jardiance 10 mg daily, Enalapril 20 mg twice daily and Spironolactone 12.5mg  daily. He previously experienced worsening kidney function with titration of Torsemide and was previously intolerant to [place].  He presented to [facility] ED on 10/14/2021 for evaluation of worsening dyspnea and diaphoresis. He was in respiratory distress upon arrival and received albuterol, Solu-Medrol and BiPAP with improvement.  Initial labs showed WBC 11.8, Hgb 17.9, platelets 186, Na+ 134, K+ 4.3 and creatinine 2.01 (baseline 1.4-1.5).  COVID negative but positive for Influenza A. Lactic Acid 4.5. ABG showing pH 7.097, CO2 65.7 and Bicarb 15.3. BNP 243. Initial Hs Troponin 17 with repeat values of 175, 426, 466 and 365. EKG shows NSR, HR 73 with PVC's and LVH with repol which is  similar to prior tracings. CXR showing interstitial prominence bilaterally vs. possible PNA. Chest CT shows cardiomegaly with interstitial edema and small to moderate layering pleural effusions and multifocal lung opacities possibly due to pneumonitis or viral etiologies.   He was admitted for SIRS in the setting of multifocal PNA and Influenza A and started on IV Ceftriaxone and Azithromycin. He did receive 1 L IVF while in the ED due to hypotension. PTA Toprol-XL, Enalapril, Spironolactone, Torsemide and Jardiance have been held.   In talking with the patient today, he reports having a productive cough starting last Friday. Reports he had started to experience some dyspnea as well. 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 He reports his breathing has already started to improve at this time. Denies any recent exertional chest pain. Has experienced some discomfort with coughing. He does follow his daily weight at home and says it had peaked at 177 lbs earlier this month but was stable at 173 lbs earlier this week.    Past Medical History:  Diagnosis Date   CHF (congestive heart failure) (HCC)    a. EF 25-30% in 2016 b. 35-40% by echo in 10/2017 c. 35-40% in 08/2020   COPD (chronic obstructive pulmonary disease) (HCC)    Coronary artery disease    a. s/p CABG in 2000 b. patent grafts in 2011 c. 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 Jude   Small bowel obstruction ([place]) 2007   resolved without surgery    Past Surgical History:  Procedure Laterality  Date   basilic vein left arm resection     CARDIAC DEFIBRILLATOR PLACEMENT     CORONARY ARTERY BYPASS GRAFT     6 vessels, 2000   INSERT / REPLACE / REMOVE PACEMAKER  01/05/10   ICD insertion/St. Jude single chamber   RIGHT/LEFT HEART CATH AND CORONARY/GRAFT ANGIOGRAPHY N/A 09/23/2020   Procedure: RIGHT/LEFT HEART CATH AND CORONARY/GRAFT ANGIOGRAPHY;  Surgeon: Sherren Mocha, MD;  Location: [facility];  Service: Cardiovascular;  Laterality: N/A;     Home Medications:  Prior to Admission medications   Medication Sig Start Date End Date Taking? Authorizing Provider  acetaminophen (TYLENOL) 500 MG tablet Take 500 mg by mouth every 6 (six) hours as needed for pain.   Yes [provider]  albuterol (VENTOLIN HFA) 108 (90 Base) MCG/ACT inhaler Inhale 2 puffs into the lungs every 4 (four) hours as needed for wheezing or shortness of breath. [id number]  Yes Delora Fuel, MD  aspirin EC 81 MG tablet Take 81 mg by mouth every evening. Swallow whole.   Yes [provider]  atorvastatin (LIPITOR) 80 MG tablet Take 1 tablet (80 mg total) by mouth every evening. 11/29/20  Yes [name], [name], MD  enalapril (VASOTEC) 20 MG tablet Take 1 tablet (20 mg total) by mouth 2 (two) times daily. 04/20/21  Yes Strader, [place] M, PA-C  fish oil-omega-3 fatty acids 1000 MG capsule Take 1 g by mouth 3 (three) times daily.   Yes [provider]  hydrOXYzine (VISTARIL) 25 MG capsule Take 1 capsule (25 mg total) by mouth 3 (three) times daily as needed. 09/13/20  Yes [name], [name], MD  ipratropium (ATROVENT) 0.02 % nebulizer solution Take 500 mcg by nebulization every 4 (four) hours as needed for shortness of breath. 08/01/21  Yes [provider]  JARDIANCE 10 MG TABS tablet TAKE 1 TABLET BY MOUTH DAILY BEFORE BREAKFAST. 09/05/21  Yes Bensimhon, Shaune Pascal, MD  metoprolol succinate (TOPROL-XL) 100 MG 24 hr tablet Take 1 tablet (100 mg total) by mouth daily. Take with or  immediately following a meal. 09/14/20 07/29/22 Yes Strader, [place] M, PA-C  Multiple Vitamin (MULTIVITAMIN WITH MINERALS) TABS tablet Take 1 tablet by mouth daily.   Yes [provider]  nitroGLYCERIN (NITROSTAT) 0.4 MG SL tablet Place 1 tablet (0.4 mg total) under the tongue every 5 (five) minutes as needed for chest pain. 05/24/16  Yes Minus Breeding, MD  omeprazole (PRILOSEC) 20 MG capsule Take 1 capsule (20 mg total) by mouth daily. 09/02/21  Yes [name], [name], MD  spironolactone (ALDACTONE) 25 MG tablet Take 0.5 tablets (12.5 mg total) by mouth daily. 02/04/21 07/29/22 Yes Clegg, Amy D, NP  torsemide (DEMADEX) 20 MG tablet Take 1 tablet (20 mg total) by mouth 2 (two) times a week. May take an extra for weight gain or edema 10/03/21 01/01/22 Yes Bensimhon, Shaune Pascal, MD  albuterol (PROVENTIL) (2.5 MG/3ML) 0.083% nebulizer solution Take 2.5 mg by nebulization every 4 (four) hours as needed for shortness of breath. 08/11/21   [provider]  doxycycline (VIBRAMYCIN) 100 MG capsule Take 100 mg by mouth 2 (two) times daily. Patient not taking: Reported on 10/17/2021 08/24/21   [provider]  EPINEPHrine 0.3 mg/0.3 mL IJ SOAJ injection Inject into the muscle as directed. Patient not taking: Reported on 10/08/2021 08/09/21   [provider]  predniSONE (STERAPRED UNI-PAK 21 TAB) 10 MG (21) TBPK tablet Use as directed Patient not taking: Reported on 09/29/2021 10/17/2021   Junie Spencer, FNP    Inpatient Medications: Scheduled Meds:  aspirin EC  81 mg Oral QPM   atorvastatin  80 mg Oral QPM   Chlorhexidine Gluconate Cloth  6 each Topical Daily   heparin  5,000 Units Subcutaneous Q8H   insulin aspart  0-5 Units Subcutaneous QHS   insulin aspart  0-9 Units Subcutaneous TID WC   ipratropium-albuterol  3 mL Nebulization Q6H   mouth rinse  15 mL Mouth Rinse BID   methylPREDNISolone (SOLU-MEDROL) injection  60 mg Intravenous Q12H   Followed by   Melene Muller ON  10/26/2021] predniSONE  40 mg Oral Q breakfast   oseltamivir  30 mg Oral BID   Continuous Infusions:  cefTRIAXone (ROCEPHIN)  IV     doxycycline (VIBRAMYCIN) IV     promethazine (PHENERGAN) injection (IM or IVPB)     PRN Meds: acetaminophen **OR** acetaminophen, albuterol, polyethylene glycol, promethazine (PHENERGAN) injection (IM or IVPB)  Allergies:    Allergies  Allergen Reactions   Entresto [Sacubitril-Valsartan]     Worsening renal function and weight gain    Social History:   Social History   Socioeconomic History   Marital status: Married    Spouse name: Kathie Rhodes   Number of children: 3   Years of education: GED   Highest education level: Not on file  Occupational History   Occupation: Runner, 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  Tobacco Use   Smoking status: Former    Types: Cigarettes    Start date: 11/28/1967    Quit date: 02/29/1988    Years since quitting: 33.6   Smokeless tobacco: Never  Vaping Use   Vaping Use: Never used  Substance and Sexual Activity   Alcohol use: No    Alcohol/week: 0.0 standard drinks   Drug use: No   Sexual activity: Never  Other Topics Concern   Not on file  Social History Narrative   1 son died age 25-Lee's syndrome         Social Determinants of Health   Financial Resource Strain: Not on file  Food Insecurity: Not on file  Transportation Needs: Not on file  Physical Activity: Not on file  Stress: Not on file  Social Connections: Not on file  Intimate Partner Violence: Not on file    Family History:    Family History  Problem Relation Age of Onset   Heart attack Mother    Emphysema Father    Coronary artery disease Father        s/p cabg   Colon cancer Neg Hx      ROS:  Please see the history of present illness.   All other ROS reviewed and negative.     Physical Exam/Data:   Vitals:   10/25/21 0730 10/25/21 0800 10/25/21 0803 10/25/21 0833  BP:   (!) 103/54   Pulse: 66 64 (!) 57 68  Resp: 18 (!) 21 19 14    Temp: 97.6 F (36.4 C)     TempSrc: Axillary     SpO2: 99% 97%  100%  Weight:      Height:        Intake/Output Summary (Last 24 hours) at 10/25/2021 1041 Last data filed at 10/25/2021 0617 Gross per 24 hour  Intake 370.24 ml  Output 375 ml  Net -4.76 ml   Last 3 Weights 10/25/2021 10/14/2021 10/07/2021  Weight (lbs) 177 lb 7.5 oz 177 lb 177 lb  Weight (kg) 80.5 kg 80.287 kg 80.287 kg     Body mass index is 24.75 kg/m.  General:  Well nourished male appearing in no acute distress HEENT: normal Neck: no JVD Vascular: No carotid bruits; Distal pulses 2+ bilaterally Cardiac:  normal S1, S2; RRR with occasional ectopic beats.  Lungs: decreased breath sounds along bases and rhonchi throughout.  Abd: soft, nontender, no hepatomegaly  Ext: no pitting edema Musculoskeletal:  No deformities, BUE and BLE strength normal and equal Skin: warm and dry  Neuro:  CNs 2-12 intact, no focal abnormalities noted Psych:  Normal affect   EKG:  The EKG was personally reviewed and demonstrates: NSR, HR 73 with PVC's and LVH with repol which is similar to prior tracings.  Telemetry:  Telemetry was personally reviewed and demonstrates: NSR, HR in 60's to 80's. Frequent PVC's and occasional episodes of NSVT with the longest being 5 beats.   Relevant CV Studies:  Echocardiogram: 08/2020 Summary    1. The left ventricle is mildly dilated in size with mildly increased wall  thickness.    2. The left ventricular systolic function is moderately decreased, LVEF is  visually estimated at 35-40%.    3. There is grade I diastolic dysfunction (impaired relaxation).    4. There is mild mitral valve regurgitation.    5. The left atrium is mildly to moderately dilated in size.    6. The right ventricle is normal in size, with normal systolic function   R/LHC: 08/2020 1.  Severe native three-vessel coronary artery disease with total occlusion of the mid LAD, mid circumflex, and distal RCA 2.  Status  post aortocoronary bypass surgery with continued patency of the LIMA to LAD, sequential saphenous vein graft to OM1 and OM 2, saphenous vein graft to diagonal 1, and saphenous vein graft to PDA 3.  Diffuse mid and distal LAD stenosis beyond the LIMA insertion site, progressive from the previous catheterization 10 years ago 4.  Essentially normal right heart hemodynamics with normal filling pressures and preserved cardiac output   Recommendations: Medical therapy for heart failure and coronary artery disease.  I do not appreciate any targets for PCI.  The patient's bypass grafts all remain patent.  Laboratory Data:  High Sensitivity Troponin:   Recent Labs  Lab 10/01/2021 1828 10/23/2021 2029 09/28/2021 2306 10/25/21 0121 10/25/21 0708  TROPONINIHS 17 175* 426* 466* 365*     Chemistry Recent Labs  Lab 10/15/2021 1828 10/25/21 0121  NA 134* 137  K 4.3 4.4  CL 103 106  CO2 17* 18*  GLUCOSE 199* 127*  BUN 33* 34*  CREATININE 2.01* 1.80*  CALCIUM 9.2 8.1*  GFRNONAA 36* 41*  ANIONGAP 14 13    Recent Labs  Lab 10/21/2021 1828  PROT 8.3*  ALBUMIN 4.1  AST 40  ALT 36  ALKPHOS 121  BILITOT 0.7   Lipids No results for input(s): CHOL, TRIG, HDL, LABVLDL, LDLCALC, CHOLHDL in the last 168 hours.  Hematology Recent Labs  Lab 10/23/2021 1828 10/25/21 0121  WBC 11.8* 12.0*  RBC 6.14* 4.71  HGB 17.9* 14.2  HCT 58.7* 42.4  MCV 95.6  90.0  MCH 29.2 30.1  MCHC 30.5 33.5  RDW 13.5 13.3  PLT 186 166   Thyroid No results for input(s): TSH, FREET4 in the last 168 hours.  BNP Recent Labs  Lab 10/15/2021 1909  BNP 243.0*    DDimer No results for input(s): DDIMER in the last 168 hours.   Radiology/Studies:  CT CHEST WO CONTRAST  Result Date: 10/19/2021 CLINICAL DATA:  Respiratory failure with hypoxia, hypercapnia, influenza infection and elevated creatinine. EXAM: CT CHEST WITHOUT CONTRAST TECHNIQUE: Multidetector CT imaging of the chest was performed following the standard protocol  without IV contrast. COMPARISON:  Portable chest today, portable chest 08/23/2021, and CTA chest 03/12/2010. FINDINGS: Cardiovascular: Mild cardiomegaly with left chamber predominance is again noted with pacemaker wiring in the heart and left chest battery again causing streak artifact. Native coronary arteries are heavily calcified with again noted old CABG and healed sternotomy changes. There is no pericardial effusion. There is mild distention of the superior pulmonary veins compared to the prior CTA, and interval increased slight prominence of the pulmonary trunk measuring 3.1 cm suggesting arterial hypertension. There is patchy calcification in the aorta and great vessels but no aortic aneurysm. Mediastinum/Nodes: No enlarged mediastinal or axillary lymph nodes. Thyroid gland, trachea, and esophagus demonstrate no significant findings. There is a small to moderate hiatal hernia again noted however, which contains mostly fat. Lungs/Pleura: There are patchy ground-glass opacities in the upper lobes which predominate in the peripheral lungs, nonlocalizing faint ground-glass opacities in the infrahilar lower lobes, and increased opacity in the posterior lower lobes alongside small to moderate-sized pleural effusions which could be atelectasis or consolidation. There is interstitial prominence in the lung apices and bases and in the lower zonal parahilar regions, with a distribution most suggestive of interstitial edema. The central airways show no focal filling defects but do show bronchial thickening in the lower lobes. Upper Abdomen: No acute abnormality.  Old cholecystectomy. Musculoskeletal: No chest wall mass or suspicious bone lesions identified. IMPRESSION: 1. Cardiomegaly with interstitial edema and small to moderate symmetric layering pleural effusions consistent with CHF or fluid overload. 2. Multifocal lung opacities. Opacities alongside the pleural effusions could be atelectasis or consolidation.  Peripheral-predominant ground-glass opacities in the upper lobes are probably due to pneumonitis including viral etiologies. Ground-glass central opacities are likely ground-glass edema and there is bronchial thickening in both lower lobes. 3. Calcific CAD with remote CABG and pacing system. 4. Slightly prominent pulmonary trunk but no right heart chamber enlargement. 5. Follow-up study recommended after treatment to ensure clearing of opacities. Electronically Signed   By: Telford Nab M.D.   On: 10/16/2021 23:00   DG Chest Portable 1 View  Result Date: 10/04/2021 CLINICAL DATA:  Respiratory distress. EXAM: PORTABLE CHEST 1 VIEW COMPARISON:  Chest x-rays dated 08/23/2021 and 05/03/2021. FINDINGS: Mildly increased interstitial markings bilaterally compared to the earlier exams, suggesting acute interstitial edema/thickening superimposed on chronic interstitial lung disease/fibrosis. No confluent opacity to suggest a consolidating pneumonia. No pleural effusion or pneumothorax is seen. Heart size and mediastinal contours are stable. LEFT chest wall pacemaker/ICD apparatus appears stable in position. Osseous structures about the chest are unremarkable. IMPRESSION: New interstitial prominence bilaterally indicating mild volume overload versus multifocal atypical pneumonia, superimposed on chronic interstitial lung disease/fibrosis. Electronically Signed   By: Franki Cabot M.D.   On: 10/26/2021 19:08     Assessment and Plan:   1. Elevated Troponin Values - He is currently admitted for acute hypoxic respiratory failure in the setting of Influenza  A and PNA. Hs Troponin values found to be mildly elevated, peaking at 466 and trending down to 365 on most recent check. He does report some pleuritic discomfort but denies any exertional chest pain prior to admission. He is having frequent PVC's and K+ was at 4.4 this AM. Will check Mg as well.  - Suspect his enzyme elevation is secondary to demand ischemia in  the setting of his acute illness. An echocardiogram has already been ordered by the admitting team but would not anticipate further ischemic testing this admission unless EF has significantly declined.    2. HFrEF - EF 25-30% in 2016, at 35-40% by echo in 10/2017, 30-35% in 01/2019, and 35-40% by echo in 08/2020. He does have a s/p St. Jude ICD in place. BNP was at 243 on admission and he did receive IVF due to hypotension and elevated lactic acid. Chest CT also showed interstitial edema and small to moderate layering pleural effusions and multifocal lung opacities possibly due to pneumonitis or viral etiologies.  - He will require diuresis later this admission, possibly starting tomorrow once BP stabilizes. His PTA Toprol-XL 100 mg daily, Jardiance 10 mg daily, Enalapril 20 mg twice daily and Spironolactone 12.5mg  daily have been held for now given his soft BP. Will add back as BP improves.   3. CAD - He is s/p CABG in 2000 with patent grafts by cath in 2011 and patent grafts by repeat cath in 08/2020. A repeat echo has been ordered as outlined above.  - Continue ASA and statin therapy. BB currently held.   4. Stage 3 CKD - Baseline creatinine 1.4 - 1.5. Peaked at 2.01 on admission and improving to 1.80 today.   5. Influenza A/PNA - He remains on Ceftriaxone, Doxycycline and Tamiflu along with IV steroids and scheduled nebs. He has been transitioned from BiPAP to nasal cannula. Further management per the admitting team.     Risk Assessment/Risk Scores:  {   New York Heart Association (NYHA) Functional Class NYHA Class II    For questions or updates, please contact [facility] Please consult [email or web address] for contact info under    Signed, Erma Heritage, PA-C  10/25/2021 10:41 AM  Patient seen and examined and agree with Bernerd Pho, PA-C as detailed above.  In brief, the patient is a 65 year old male with history of CAD (s/p CABG in 2000, patent grafts by cath in  2011 and patent grafts by repeat cath in 08/2020), HFrEF (EF 25-30% in 2016, at 35-40% by echo in 10/2017, 30-35% in 01/2019, and 35-40% by echo in 08/2020, s/p St. Jude ICD placement in 2011), HTN, HLD, Type 2 DM (diet-controlled) and Stage 3 CKD who presented with acute onset SOB found to have influenza A and multifocal PNA. Cardiology is consulted regarding elevated troponin and HF.  Patient has been overall stable from a HF standpoint over the past several weeks. Monitors weights closely at home and has been consistently around 173lbs. Has been compliant with all medications and denies any anginal symptoms. He developed cough on Friday which progressed to severe SOB prompting him to come to the ER as above where he was diagnosed with influenza A and multifocal PNA. Chest CT obtained demonstrated cardiomegaly with interstitial edema and small to moderate layering pleural effusions and multifocal lung opacities possibly due to pneumonitis or viral etiologies. Initially required BiPAP but has since been weaned to nasal cannula. Was hypotensive on arrival and received 1L IVF. Trop was checked and was  400>365. BNP 243.  Currently, the patient states he feels much better. HF medications and diuretics held due to hypotension on arrival. Denies any chest pain or exertional symptoms. Appears mildly volume up on examination.   Suspect troponin elevation secondary to demand in the setting of influenza A and multifocal pneumonia with low suspicion for ACS. Will likely need diuresis tomorrow once blood pressures improve as mildly volume up. Will resume HF meds as tolerated.  GEN: No acute distress. Comfortable, [place] in place  Neck: JVD mid-neck Cardiac: RRR, no murmurs, rubs, or gallops.  Respiratory: Rhonchorous throughout; good air movement GI: Soft, nontender, non-distended  MS: No edema; No deformity. Neuro:  Nonfocal  Psych: Normal affect    Plan: -Suspect trop elevation secondary to demand with low  suspicion of ACS -Management of influenza A and multifocal PNA per primary team -Will need diuresis likely tomorrow pending BP as appears mildly volume up -Will add back GDMT as tolerated pending blood pressures -TTE ordered by primary; will follow-up  Gwyndolyn Kaufman, MD

## 2021-10-25 NOTE — Progress Notes (Signed)
HOSPITAL MEDICINE OVERNIGHT EVENT NOTE    Notified by nursing that patient's troponin has risen slightly to 466, up from 426.  Patient is currently hospitalized for acute hypoxic and hypercapnic respiratory failure currently on BiPAP therapy with multifocal pneumonia.  Admitting provider feels the patient is suffering from demand ischemia secondary to underlying illness.  Per my discussion with nursing patient is chest pain-free, resting comfortably on BiPAP therapy.  I would tend to agree that this is demand ischemia and it seems the upward trajectory of the troponin is slowing.  We will continue to monitor on telemetry, obtain 1 additional troponin so that we can trend until it peaks to be drawn at 7:15 AM.  Marinda Elk  MD [facility]

## 2021-10-25 NOTE — Progress Notes (Signed)
Fio2 decreased to 65% set rate decreased to 12 pt tolerating well nurse informed RT will continue to monitor

## 2021-10-25 NOTE — Progress Notes (Signed)
ANTICOAGULATION CONSULT NOTE - Initial Consult  Pharmacy Consult for Enoxaparin Indication: atrial fibrillation  Allergies  Allergen Reactions   Entresto [Sacubitril-Valsartan]     Worsening renal function and weight gain    Patient Measurements: Height: 5\' 11"  (180.3 cm) Weight: 80.5 kg (177 lb 7.5 oz) IBW/kg (Calculated) : 75.3  Vital Signs: Temp: 97.6 F (36.4 C) (11/29 1609) Temp Source: Oral (11/29 1609) BP: 108/52 (11/29 1400) Pulse Rate: 126 (11/29 1916)  Labs: Recent Labs    10/19/2021 1828 10/08/2021 2029 10/25/21 0121 10/25/21 0708 10/25/21 1945  HGB 17.9*  --  14.2  --  16.2  HCT 58.7*  --  42.4  --  50.7  PLT 186  --  166  --  292  CREATININE 2.01*  --  1.80*  --  2.30*  TROPONINIHS 17   < > 466* 365* 281*   < > = values in this interval not displayed.    Estimated Creatinine Clearance: 33.2 mL/min (A) (by C-G formula based on SCr of 2.3 mg/dL (H)).   Medical History: Past Medical History:  Diagnosis Date   CHF (congestive heart failure) (HCC)    a. EF 25-30% in 2016 b. 35-40% by echo in 10/2017 c. 35-40% in 08/2020   COPD (chronic obstructive pulmonary disease) (HCC)    Coronary artery disease    a. s/p CABG in 2000 b. patent grafts in 2011 c. 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   Small bowel obstruction (HCC) 2007   resolved without surgery    Assessment: 65 yr old man admitted with acute respiratory failure with hypoxia and hypercapnia, influenza A, COPD exacerbation, pneumonia, acute on chronic kidney injury, ischemic cardiomyopathy, chronic systolic HF, hx CABG, HTN, diabetes. Pt with  tachycardia this evening, which improved with diltiazem. Pharmacy is consulted to dose enoxaparin for atrial fibrillation (per provider note, plan to transition pt to DOAC when respiratory status has stabilized).   H/H 16.2/50.7, plt 292; Scr 2.30, TBW CrCl ~36 ml/min  Pt has been receiving SQ heparin for VTE prophylaxis (last dose ~1400 today)  Goal of Therapy:  Prevention of stroke secondary to atrial fibrillation Monitor platelets by anticoagulation protocol: Yes   Plan:  Enoxaparin 1 mg/kg (80 mg) SQ Q 12 hrs Monitor CBC, renal function Monitor for bleeding F/U transition to oral anticoagulant once stable from respiratory standpoint  07-28-1997, PharmD, BCPS, [facility] Clinical Pharmacist 10/25/2021,9:25 PM

## 2021-10-25 NOTE — Progress Notes (Signed)
HOSPITAL MEDICINE OVERNIGHT EVENT NOTE    Patient evaluated at the bedside at approximately 10 PM.  Patient clinically improved on BiPAP therapy compared to earlier in the evening on diltiazem infusion.  Patient exhibiting some diuresis with 40 mg of intravenous Lasix administered by daytime provider.  Shortly thereafter the patient spontaneously converted to normal sinus rhythm.  Diltiazem infusion rate was decreased somewhat and is currently at 5 mg an hour.  Troponins overnight resulted as 281 followed by 396, presumably secondary to extended bout of rapid atrial fibrillation.  I anticipate that this would come down on its own now that the patient has converted to normal sinus rhythm and is clinically improving.  Marinda Elk  MD [facility]   ADDENDUM (11/30 4:10am)  Repeat chemistry revealing creatinine of 2.07.  Nursing reports the patient is continuing to do well and remains in normal sinus rhythm.  Will administer an additional dose of 60 mg of intravenous Lasix.  We will cycle an additional troponin at 7 AM to trend to peak.  I am hopeful that this troponin will actually be lower than the last.  Marinda Elk

## 2021-10-25 NOTE — Progress Notes (Signed)
PROGRESS NOTE    Joseph [name]  [id number] DOB: 09-11-1956 DOA: 10/13/2021 PCP: [name], [name], MD    Brief Narrative:  65 year old male with a history of COPD, CAD, diabetes, systolic congestive heart failure, admitted to the hospital with respiratory distress.  Initially required BiPAP for respiratory acidosis and hypoxia.  He tested positive for influenza A.  Started on Tamiflu/antibiotics as well as treatment for COPD exacerbation with bronchodilators and steroids.  Noted to have elevated troponin and possible some signs of pulmonary edema.  Cardiology following.  Overall respiratory status improving.   Assessment & Plan:   Principal Problem:   Acute respiratory failure with hypoxia and hypercapnia (HCC) Active Problems:   Cardiomyopathy, ischemic   Automatic implantable cardioverter-defibrillator in situ   Chronic systolic heart failure (HCC)   Hx of CABG   Hypertension associated with diabetes ([place])   Controlled diabetes mellitus with stage 3 chronic kidney disease (HCC)   Influenza A   COPD with acute exacerbation (HCC)   Acute-on-chronic kidney injury ([place])   Pneumonia   Pressure injury of skin   Acute respiratory failure with hypoxia and hypercapnia -Likely multifactorial in the setting of influenza A infection, COPD, possible CHF exacerbation -Patient placed on BiPAP with improvement of acidosis -Clinically appears to be improving, will give a trial off of BiPAP and placed on nasal cannula -He is not chronically on oxygen -Continue to wean off oxygen as tolerated  Multifocal pneumonia -Likely related to influenza -He is on Tamiflu -Procalcitonin mildly elevated at 1.16 -Due to severity of illness, will continue on antibacterials for now -Continue pulmonary hygiene  COPD with acute exacerbation -Started on intravenous steroids, bronchodilators -If continued respiratory improvement, can consider de-escalating steroids tomorrow prednisone taper  Elevated  troponin -Suspect this may be demand ischemia in the setting of acute respiratory illness -Echocardiogram has been ordered -Appreciate cardiology input  Chronic systolic congestive heart failure -Last echocardiogram from 2020 shows EF of 30 to 35% -Status post AICD -He is chronically on enalapril, spironolactone and metoprolol.  These have been held due to low blood pressures -Chronically takes torsemide, also held due to low blood pressures for now -Imaging indicates possible pulmonary edema -Would defer to cardiology regarding initiation of diuresis  AKI on CKD stage IIIa -Baseline creatinine appears to be around 1.4 -Admission creatinine noted to be 2.0 -Possibly related to hypotension on admission -He received 1 L bolus of IV fluids in the emergency room -Continue to hold ACE inhibitors -Currently creatinine 1.8  Coronary artery disease status post CABG -Continue aspirin and Lipitor  Diabetes type 2, non-insulin-dependent -Chronically on Jardiance -Continue on sliding scale for now -Monitor blood sugars  DVT prophylaxis: heparin injection 5,000 Units Start: 10/25/21 0600  Code Status: Full code Family Communication: updated patients wife over the phone 11/29 Disposition Plan: Status is: Inpatient  Remains inpatient appropriate because: Continued management of respiratory failure, elevated troponins         Consultants:  Cardiology  Procedures:  Echocardiogram  Antimicrobials:  Ceftriaxone 11/28 > Doxycycline 11/28 > Tamiflu 11/28 >   Subjective: Feels that breathing is improving since admission.  Nonproductive cough.  History is limited since he is on BiPAP.  Objective: Vitals:   10/25/21 0833 10/25/21 1000 10/25/21 1100 10/25/21 1146  BP:  (!) 115/59 (!) 116/57   Pulse: 68 75 68   Resp: 14 16 20    Temp:    98.1 F (36.7 C)  TempSrc:    Oral  SpO2: 100% 95% 95%  Weight:      Height:        Intake/Output Summary (Last 24 hours) at  10/25/2021 1229 Last data filed at 10/25/2021 0617 Gross per 24 hour  Intake 370.24 ml  Output 375 ml  Net -4.76 ml   Filed Weights   10/20/2021 1935 10/25/21 0043  Weight: 80.3 kg 80.5 kg    Examination:  General exam: Appears calm and comfortable, currently on bipap  Respiratory system: scattered rhonchi. Respiratory effort normal. Cardiovascular system: S1 & S2 heard, RRR. No JVD, murmurs, rubs, gallops or clicks. 1+pedal edema. Gastrointestinal system: Abdomen is nondistended, soft and nontender. No organomegaly or masses felt. Normal bowel sounds heard. Central nervous system: Alert and oriented. No focal neurological deficits. Extremities: Symmetric 5 x 5 power. Skin: No rashes, lesions or ulcers Psychiatry: Judgement and insight appear normal. Mood & affect appropriate.     Data Reviewed: I have personally reviewed following labs and imaging studies  CBC: Recent Labs  Lab 10/21/2021 1828 10/25/21 0121  WBC 11.8* 12.0*  NEUTROABS 5.8  --   HGB 17.9* 14.2  HCT 58.7* 42.4  MCV 95.6 90.0  PLT 186 [id number]   Basic Metabolic Panel: Recent Labs  Lab 10/07/2021 1828 10/25/21 0121  NA 134* 137  K 4.3 4.4  CL 103 106  CO2 17* 18*  GLUCOSE 199* 127*  BUN 33* 34*  CREATININE 2.01* 1.80*  CALCIUM 9.2 8.1*   GFR: Estimated Creatinine Clearance: 42.4 mL/min (A) (by C-G formula based on SCr of 1.8 mg/dL (H)). Liver Function Tests: Recent Labs  Lab 10/03/2021 1828  AST 40  ALT 36  ALKPHOS 121  BILITOT 0.7  PROT 8.3*  ALBUMIN 4.1   No results for input(s): LIPASE, AMYLASE in the last 168 hours. No results for input(s): AMMONIA in the last 168 hours. Coagulation Profile: No results for input(s): INR, PROTIME in the last 168 hours. Cardiac Enzymes: No results for input(s): CKTOTAL, CKMB, CKMBINDEX, TROPONINI in the last 168 hours. BNP (last 3 results) No results for input(s): PROBNP in the last 8760 hours. HbA1C: No results for input(s): HGBA1C in the last 72  hours. CBG: Recent Labs  Lab 10/04/2021 1814 10/25/21 0103 10/25/21 0732 10/25/21 1135  GLUCAP 178* 112* 115* 125*   Lipid Profile: No results for input(s): CHOL, HDL, LDLCALC, TRIG, CHOLHDL, LDLDIRECT in the last 72 hours. Thyroid Function Tests: No results for input(s): TSH, T4TOTAL, FREET4, T3FREE, THYROIDAB in the last 72 hours. Anemia Panel: No results for input(s): VITAMINB12, FOLATE, FERRITIN, TIBC, IRON, RETICCTPCT in the last 72 hours. Sepsis Labs: Recent Labs  Lab 10/23/2021 1828 10/09/2021 2029 10/23/2021 2306  PROCALCITON  --   --  1.16  LATICACIDVEN 4.5* 2.4*  --     Recent Results (from the past 240 hour(s))  Resp Panel by RT-PCR (Flu A&B, Covid) Nasopharyngeal Swab     Status: Abnormal   Collection Time: 10/06/2021  6:28 PM   Specimen: Nasopharyngeal Swab; Nasopharyngeal(NP) swabs in vial transport medium  Result Value Ref Range Status   SARS Coronavirus 2 by RT PCR NEGATIVE NEGATIVE Final    Comment: (NOTE) SARS-CoV-2 target nucleic acids are NOT DETECTED.  The SARS-CoV-2 RNA is generally detectable in upper respiratory specimens during the acute phase of infection. The lowest concentration of SARS-CoV-2 viral copies this assay can detect is 138 copies/mL. A negative result does not preclude SARS-Cov-2 infection and should not be used as the sole basis for treatment or other patient management decisions. A negative result may  occur with  improper specimen collection/handling, submission of specimen other than nasopharyngeal swab, presence of viral mutation(s) within the areas targeted by this assay, and inadequate number of viral copies(<138 copies/mL). A negative result must be combined with clinical observations, patient history, and epidemiological information. The expected result is Negative.  Fact Sheet for Patients:  EntrepreneurPulse.com.au  Fact Sheet for Healthcare Providers:  IncredibleEmployment.be  This test is  no t yet approved or cleared by the [place] FDA and  has been authorized for detection and/or diagnosis of SARS-CoV-2 by FDA under an Emergency Use Authorization (EUA). This EUA will remain  in effect (meaning this test can be used) for the duration of the COVID-19 declaration under Section 564(b)(1) of the Act, 21 U.S.C.section 360bbb-3(b)(1), unless the authorization is terminated  or revoked sooner.       Influenza A by PCR POSITIVE (A) NEGATIVE Final   Influenza B by PCR NEGATIVE NEGATIVE Final    Comment: (NOTE) The Xpert Xpress SARS-CoV-2/FLU/RSV plus assay is intended as an aid in the diagnosis of influenza from Nasopharyngeal swab specimens and should not be used as a sole basis for treatment. Nasal washings and aspirates are unacceptable for Xpert Xpress SARS-CoV-2/FLU/RSV testing.  Fact Sheet for Patients: EntrepreneurPulse.com.au  Fact Sheet for Healthcare Providers: IncredibleEmployment.be  This test is not yet approved or cleared by the [place] FDA and has been authorized for detection and/or diagnosis of SARS-CoV-2 by FDA under an Emergency Use Authorization (EUA). This EUA will remain in effect (meaning this test can be used) for the duration of the COVID-19 declaration under Section 564(b)(1) of the Act, 21 U.S.C. section 360bbb-3(b)(1), unless the authorization is terminated or revoked.  Performed at [facility], [address] [place], [place] [address]   Culture, blood (routine x 2)     Status: None (Preliminary result)   Collection Time: 10/12/2021  7:09 PM   Specimen: Left Antecubital; Blood  Result Value Ref Range Status   Specimen Description LEFT ANTECUBITAL  Final   Special Requests   Final    BOTTLES DRAWN AEROBIC AND ANAEROBIC Blood Culture results may not be optimal due to an inadequate volume of blood received in culture bottles   Culture   Final    NO GROWTH < 24 HOURS Performed at [facility], [address], [place], [place] [address]    Report Status PENDING  Incomplete  Culture, blood (routine x 2)     Status: None (Preliminary result)   Collection Time: 10/23/2021  7:11 PM   Specimen: BLOOD RIGHT FOREARM  Result Value Ref Range Status   Specimen Description BLOOD RIGHT FOREARM  Final   Special Requests   Final    BOTTLES DRAWN AEROBIC AND ANAEROBIC Blood Culture results may not be optimal due to an inadequate volume of blood received in culture bottles   Culture   Final    NO GROWTH < 24 HOURS Performed at [facility], [address], [place], [place] [address]    Report Status PENDING  Incomplete  MRSA Next Gen by PCR, Nasal     Status: None   Collection Time: 10/25/21 12:32 AM   Specimen: Nasal Mucosa; Nasal Swab  Result Value Ref Range Status   MRSA by PCR Next Gen NOT DETECTED NOT DETECTED Final    Comment: (NOTE) The GeneXpert MRSA Assay (FDA approved for NASAL specimens only), is one component of a comprehensive MRSA colonization surveillance program. It is not intended to diagnose MRSA infection nor to  guide or monitor treatment for MRSA infections. Test performance is not FDA approved in patients less than 42 years old. Performed at [facility], [address], [place], [place] [address]          Radiology Studies: CT CHEST WO CONTRAST  Result Date: 10/27/2021 CLINICAL DATA:  Respiratory failure with hypoxia, hypercapnia, influenza infection and elevated creatinine. EXAM: CT CHEST WITHOUT CONTRAST TECHNIQUE: Multidetector CT imaging of the chest was performed following the standard protocol without IV contrast. COMPARISON:  Portable chest today, portable chest 08/23/2021, and CTA chest 03/12/2010. FINDINGS: Cardiovascular: Mild cardiomegaly with left chamber predominance is again noted with pacemaker wiring in the heart and left chest battery again causing streak artifact. Native coronary arteries are heavily calcified with again noted old CABG and healed  sternotomy changes. There is no pericardial effusion. There is mild distention of the superior pulmonary veins compared to the prior CTA, and interval increased slight prominence of the pulmonary trunk measuring 3.1 cm suggesting arterial hypertension. There is patchy calcification in the aorta and great vessels but no aortic aneurysm. Mediastinum/Nodes: No enlarged mediastinal or axillary lymph nodes. Thyroid gland, trachea, and esophagus demonstrate no significant findings. There is a small to moderate hiatal hernia again noted however, which contains mostly fat. Lungs/Pleura: There are patchy ground-glass opacities in the upper lobes which predominate in the peripheral lungs, nonlocalizing faint ground-glass opacities in the infrahilar lower lobes, and increased opacity in the posterior lower lobes alongside small to moderate-sized pleural effusions which could be atelectasis or consolidation. There is interstitial prominence in the lung apices and bases and in the lower zonal parahilar regions, with a distribution most suggestive of interstitial edema. The central airways show no focal filling defects but do show bronchial thickening in the lower lobes. Upper Abdomen: No acute abnormality.  Old cholecystectomy. Musculoskeletal: No chest wall mass or suspicious bone lesions identified. IMPRESSION: 1. Cardiomegaly with interstitial edema and small to moderate symmetric layering pleural effusions consistent with CHF or fluid overload. 2. Multifocal lung opacities. Opacities alongside the pleural effusions could be atelectasis or consolidation. Peripheral-predominant ground-glass opacities in the upper lobes are probably due to pneumonitis including viral etiologies. Ground-glass central opacities are likely ground-glass edema and there is bronchial thickening in both lower lobes. 3. Calcific CAD with remote CABG and pacing system. 4. Slightly prominent pulmonary trunk but no right heart chamber enlargement. 5.  Follow-up study recommended after treatment to ensure clearing of opacities. Electronically Signed   By: Almira Bar M.D.   On: 10-27-21 23:00   DG Chest Portable 1 View  Result Date: 27-Oct-2021 CLINICAL DATA:  Respiratory distress. EXAM: PORTABLE CHEST 1 VIEW COMPARISON:  Chest x-rays dated 08/23/2021 and 05/03/2021. FINDINGS: Mildly increased interstitial markings bilaterally compared to the earlier exams, suggesting acute interstitial edema/thickening superimposed on chronic interstitial lung disease/fibrosis. No confluent opacity to suggest a consolidating pneumonia. No pleural effusion or pneumothorax is seen. Heart size and mediastinal contours are stable. LEFT chest wall pacemaker/ICD apparatus appears stable in position. Osseous structures about the chest are unremarkable. IMPRESSION: New interstitial prominence bilaterally indicating mild volume overload versus multifocal atypical pneumonia, superimposed on chronic interstitial lung disease/fibrosis. Electronically Signed   By: Bary Richard M.D.   On: 10-27-2021 19:08        Scheduled Meds:  aspirin EC  81 mg Oral QPM   atorvastatin  80 mg Oral QPM   Chlorhexidine Gluconate Cloth  6 each Topical Daily   heparin  5,000 Units Subcutaneous Q8H   insulin  aspart  0-5 Units Subcutaneous QHS   insulin aspart  0-9 Units Subcutaneous TID WC   ipratropium-albuterol  3 mL Nebulization Q6H   mouth rinse  15 mL Mouth Rinse BID   methylPREDNISolone (SOLU-MEDROL) injection  60 mg Intravenous Q12H   Followed by   Melene Muller ON 10/26/2021] predniSONE  40 mg Oral Q breakfast   oseltamivir  30 mg Oral BID   Continuous Infusions:  cefTRIAXone (ROCEPHIN)  IV     doxycycline (VIBRAMYCIN) IV     promethazine (PHENERGAN) injection (IM or IVPB)       LOS: 1 day    Critical care procedure note Authorized and performed by: Erick Blinks Total critical care time: Approximately 35 minutes Due to high probability of clinically significant,  life-threatening deterioration, the patient required my highest level of preparedness to intervene emergently and I personally spent this critical care time directly and personally managing the patient.  The critical care time included obtaining a history, examining the patient, pulse oximetry, ordering and review of studies, arranging urgent treatment with development of a management plan, evaluation of patient's response to treatment, frequent reassessment, discussions with other providers.  Critical care time was performed to assess and manage the high probability of imminent, life-threatening deterioration that could result in multiorgan failure.  It was exclusive of separate billable procedures and treating other patients and teaching time.  Please see MDM section and the rest of the of note for further information on patient assessment and treatment     Erick Blinks, MD [facility]   If 7PM-7AM, please contact night-coverage [email or web address]  10/25/2021, 12:29 PM

## 2021-10-25 NOTE — Progress Notes (Signed)
  Amiodarone Drug - Drug Interaction Consult Note  Recommendations:  Monitor for muscle pain or weakness while on atorvastatin  Monitor QTc, monitor/replace K, Mg while on promethazine  Amiodarone is metabolized by the cytochrome P450 system and therefore has the potential to cause many drug interactions. Amiodarone has an average plasma half-life of 50 days (range 20 to 100 days).   There is potential for drug interactions to occur several weeks or months after stopping treatment and the onset of drug interactions may be slow after initiating amiodarone.   [x]  Statins: Increased risk of myopathy. Simvastatin- restrict dose to 20mg  daily. Other statins: counsel patients to report any muscle pain or weakness immediately.  []  Anticoagulants: Amiodarone can increase anticoagulant effect. Consider warfarin dose reduction. Patients should be monitored closely and the dose of anticoagulant altered accordingly, remembering that amiodarone levels take several weeks to stabilize.  []  Antiepileptics: Amiodarone can increase plasma concentration of phenytoin, the dose should be reduced. Note that small changes in phenytoin dose can result in large changes in levels. Monitor patient and counsel on signs of toxicity.  []  Beta blockers: increased risk of bradycardia, AV block and myocardial depression. Sotalol - avoid concomitant use.  []   Calcium channel blockers (diltiazem and verapamil): increased risk of bradycardia, AV block and myocardial depression.  []   Cyclosporine: Amiodarone increases levels of cyclosporine. Reduced dose of cyclosporine is recommended.  []  Digoxin dose should be halved when amiodarone is started.  []  Diuretics: increased risk of cardiotoxicity if hypokalemia occurs.  []  Oral hypoglycemic agents (glyburide, glipizide, glimepiride): increased risk of hypoglycemia. Patient's glucose levels should be monitored closely when initiating amiodarone therapy.   [x]  Drugs that  prolong the QT interval:  Torsades de pointes risk may be increased with concurrent use - avoid if possible.  Monitor QTc, also keep magnesium/potassium WNL if concurrent therapy can't be avoided.  Antibiotics: e.g. fluoroquinolones, erythromycin.  Antiarrhythmics: e.g. quinidine, procainamide, disopyramide, sotalol.  Antipsychotics: e.g. phenothiazines, haloperidol.   Lithium, tricyclic antidepressants, and methadone.  , PharmD, BCPS, [facility] Clinical Pharmacist 10/25/2021 10:09 PM

## 2021-10-25 NOTE — Progress Notes (Signed)
Pt transported from ED 5 to IC07 w/o complications on BiPAP. RT will cont to monitor.

## 2021-10-25 NOTE — Progress Notes (Signed)
  Echocardiogram 2D Echocardiogram has been performed.  Joseph [name] 10/25/2021, 9:40 AM

## 2021-10-25 NOTE — Progress Notes (Signed)
I was asked to evaluate this patient at the bedside for acute onset shortness of breath and tachycardia  Patient was admitted yesterday evening for shortness of breath.  He was found to be influenza positive.  He was hypotensive on admission and actually required a fluid bolus.  He has known cardiomyopathy with most recent EF from 2020 of 30 to 35%.  He takes torsemide, spironolactone, enalapril and metoprolol.  These medications were held on admission due to hypotension and concern for volume depletion.  Initial imaging indicated some interstitial edema.  BNP was 243.  He was initially placed on BiPAP and by this morning, respiratory status had improved to the point that he was taken off BiPAP and was breathing comfortably on 2 L.  For the majority of the day, patient has been doing well.  He was seen by cardiology earlier today for elevated troponin (felt to be likely demand ischemia in the setting of acute illness), with plans to restart diuretics on 11/30, provided blood pressure was stable  At the time of my evaluation, patient noted to be tachypneic, sitting up in bed.  He had been coughing up frothy pink sputum.  Increased work of breathing.  He was noted to have some lower extremity edema.  He has bilateral crackles on exam.  Heart rate is ranging from 140-180.  EKG was attempted, but since patient was so diaphoretic, his leads were not transmitting appropriately.  With his extreme tachycardia, it was difficult to ascertain rhythm, but I would suspect SVT versus rapid atrial fibrillation.  Blood pressure at this time noted to be elevated in the 160 range.  He did receive a dose of IV metoprolol without significant change in his heart rate.  Subsequently, he received a dose of adenosine 6 mg, followed by 12 mg dose without any change in rhythm.  He did receive a dose of IV Cardizem with mild improvement of heart rate into the 130s.  He has been started on a Cardizem infusion.  He also received a bolus of  amiodarone.  Blood pressure currently in the 140s.  Chest x-ray performed showed worsening bilateral infiltrates.  He received a dose of intravenous Lasix.  Currently he is back on BiPAP with good oxygen saturations.  Overall work of breathing is starting to improve, although he is still short of breath.  We will continue with rate control and can give further Lasix based on urine output.  Since his CHA2DS2-VASc score is 4, will discontinue subcutaneous heparin and start on therapeutic Lovenox.  Can consider transitioning to DOAC in a.m. once respiratory status has stabilized.  We will check CBC, CMP, magnesium, troponin, D-dimer and repeat BNP.  Updated patient's wife.  Will also sign out to night team.  Critical care procedure note Authorized and performed by: Erick Blinks Total critical care time: Approximately 45 minutes Due to high probability of clinically significant, life-threatening deterioration, the patient required my highest level of preparedness to intervene emergently and I personally spent this critical care time directly and personally managing the patient.  The critical care time included obtaining a history, examining the patient, pulse oximetry, ordering and review of studies, arranging urgent treatment with development of a management plan, evaluation of patient's response to treatment, frequent reassessment, discussions with other providers.  Critical care time was performed to assess and manage the high probability of imminent, life-threatening deterioration that could result in multiorgan failure.  It was exclusive of separate billable procedures and treating other patients and teaching time.  Please see MDM section and the rest of the of note for further information on patient assessment and treatment   [employer]

## 2021-10-26 ENCOUNTER — Inpatient Hospital Stay: Payer: Self-pay

## 2021-10-26 DIAGNOSIS — I248 Other forms of acute ischemic heart disease: Secondary | ICD-10-CM

## 2021-10-26 DIAGNOSIS — I255 Ischemic cardiomyopathy: Secondary | ICD-10-CM | POA: Diagnosis not present

## 2021-10-26 DIAGNOSIS — Z95810 Presence of automatic (implantable) cardiac defibrillator: Secondary | ICD-10-CM

## 2021-10-26 DIAGNOSIS — J101 Influenza due to other identified influenza virus with other respiratory manifestations: Secondary | ICD-10-CM

## 2021-10-26 DIAGNOSIS — I5022 Chronic systolic (congestive) heart failure: Secondary | ICD-10-CM | POA: Diagnosis not present

## 2021-10-26 DIAGNOSIS — I4891 Unspecified atrial fibrillation: Secondary | ICD-10-CM | POA: Diagnosis not present

## 2021-10-26 DIAGNOSIS — I480 Paroxysmal atrial fibrillation: Secondary | ICD-10-CM

## 2021-10-26 DIAGNOSIS — J9601 Acute respiratory failure with hypoxia: Secondary | ICD-10-CM | POA: Diagnosis not present

## 2021-10-26 LAB — BASIC METABOLIC PANEL
Anion gap: 12 (ref 5–15)
BUN: 46 mg/dL — ABNORMAL HIGH (ref 8–23)
CO2: 17 mmol/L — ABNORMAL LOW (ref 22–32)
Calcium: 8.3 mg/dL — ABNORMAL LOW (ref 8.9–10.3)
Chloride: 108 mmol/L (ref 98–111)
Creatinine, Ser: 2.07 mg/dL — ABNORMAL HIGH (ref 0.61–1.24)
GFR, Estimated: 34 mL/min — ABNORMAL LOW (ref >60)
Glucose, Bld: 129 mg/dL — ABNORMAL HIGH (ref 70–99)
Potassium: 4.4 mmol/L (ref 3.5–5.1)
Sodium: 137 mmol/L (ref 135–145)

## 2021-10-26 LAB — MAGNESIUM: Magnesium: 2.3 mg/dL (ref 1.7–2.4)

## 2021-10-26 LAB — GLUCOSE, CAPILLARY
Glucose-Capillary: 163 mg/dL — ABNORMAL HIGH (ref 70–99)
Glucose-Capillary: 164 mg/dL — ABNORMAL HIGH (ref 70–99)
Glucose-Capillary: 191 mg/dL — ABNORMAL HIGH (ref 70–99)
Glucose-Capillary: 102 mg/dL — ABNORMAL HIGH (ref 70–99)

## 2021-10-26 LAB — TROPONIN I (HIGH SENSITIVITY)
Troponin I (High Sensitivity): 396 ng/L (ref <18)
Troponin I (High Sensitivity): 305 ng/L (ref <18)

## 2021-10-26 MED ORDER — METOPROLOL TARTRATE 5 MG/5ML IV SOLN
5.0000 mg | INTRAVENOUS | Status: AC
  Administered 2021-10-26: 5 mg via INTRAVENOUS

## 2021-10-26 MED ORDER — METHYLPREDNISOLONE SODIUM SUCC 125 MG IJ SOLR
60.0000 mg | Freq: Every day | INTRAMUSCULAR | Status: DC
  Administered 2021-10-27 – 2021-10-29 (×3): 60 mg via INTRAVENOUS
  Filled 2021-10-26 (×3): qty 2

## 2021-10-26 MED ORDER — SODIUM CHLORIDE 0.9% FLUSH: 10.0000 mL | INTRAVENOUS | Status: DC | PRN

## 2021-10-26 MED ORDER — METOPROLOL TARTRATE 5 MG/5ML IV SOLN
2.5000 mg | INTRAVENOUS | Status: AC
  Administered 2021-10-26: 2.5 mg via INTRAVENOUS

## 2021-10-26 MED ORDER — LEVALBUTEROL HCL 0.63 MG/3ML IN NEBU
INHALATION_SOLUTION | RESPIRATORY_TRACT | Status: AC
  Filled 2021-10-26: qty 3

## 2021-10-26 MED ORDER — LEVALBUTEROL HCL 0.63 MG/3ML IN NEBU
0.6300 mg | INHALATION_SOLUTION | Freq: Four times a day (QID) | RESPIRATORY_TRACT | Status: DC | PRN
  Administered 2021-10-26: 0.63 mg via RESPIRATORY_TRACT

## 2021-10-26 MED ORDER — LEVALBUTEROL HCL 0.63 MG/3ML IN NEBU
0.6300 mg | INHALATION_SOLUTION | Freq: Three times a day (TID) | RESPIRATORY_TRACT | Status: DC
  Administered 2021-10-26 – 2021-10-28 (×4): 0.63 mg via RESPIRATORY_TRACT
  Filled 2021-10-26 (×5): qty 3

## 2021-10-26 MED ORDER — FUROSEMIDE 10 MG/ML IJ SOLN
60.0000 mg | Freq: Once | INTRAMUSCULAR | Status: AC
Start: 2021-10-26 — End: 2021-10-26
  Administered 2021-10-26: 60 mg via INTRAVENOUS
  Filled 2021-10-26: qty 6

## 2021-10-26 MED ORDER — AMIODARONE IV BOLUS ONLY 150 MG/100ML
150.0000 mg | Freq: Once | INTRAVENOUS | Status: AC
  Administered 2021-10-26: 150 mg via INTRAVENOUS

## 2021-10-26 MED ORDER — SODIUM CHLORIDE 0.9% FLUSH
10.0000 mL | Freq: Two times a day (BID) | INTRAVENOUS | Status: DC
  Administered 2021-10-26 – 2021-10-28 (×6): 10 mL

## 2021-10-26 MED ORDER — AMIODARONE HCL IN DEXTROSE 360-4.14 MG/200ML-% IV SOLN
30.0000 mg/h | INTRAVENOUS | Status: DC
  Administered 2021-10-26: 30 mg/h via INTRAVENOUS
  Administered 2021-10-26: 60 mg/h via INTRAVENOUS
  Administered 2021-10-26: 30 mg/h via INTRAVENOUS
  Administered 2021-10-27 – 2021-10-28 (×3): 60 mg/h via INTRAVENOUS
  Administered 2021-10-28: 30 mg/h via INTRAVENOUS
  Administered 2021-10-28: 60 mg/h via INTRAVENOUS
  Administered 2021-10-29: 30 mg/h via INTRAVENOUS
  Filled 2021-10-26 (×9): qty 200

## 2021-10-26 MED ORDER — BUDESONIDE 0.5 MG/2ML IN SUSP
0.5000 mg | Freq: Two times a day (BID) | RESPIRATORY_TRACT | Status: DC
  Administered 2021-10-26 – 2021-11-14 (×38): 0.5 mg via RESPIRATORY_TRACT
  Filled 2021-10-26 (×38): qty 2

## 2021-10-26 MED ORDER — METOPROLOL TARTRATE 5 MG/5ML IV SOLN
INTRAVENOUS | Status: AC
  Filled 2021-10-26: qty 5

## 2021-10-26 MED ORDER — AMIODARONE HCL IN DEXTROSE 360-4.14 MG/200ML-% IV SOLN
60.0000 mg/h | INTRAVENOUS | Status: AC
  Administered 2021-10-26: 60 mg/h via INTRAVENOUS
  Filled 2021-10-26: qty 200

## 2021-10-26 MED ORDER — METOPROLOL TARTRATE 5 MG/5ML IV SOLN
5.0000 mg | INTRAVENOUS | Status: AC
  Administered 2021-10-26: 5 mg via INTRAVENOUS
  Filled 2021-10-26: qty 5

## 2021-10-26 MED ORDER — METOPROLOL SUCCINATE ER 25 MG PO TB24
25.0000 mg | ORAL_TABLET | Freq: Every day | ORAL | Status: DC
  Administered 2021-10-26: 25 mg via ORAL
  Filled 2021-10-26: qty 1

## 2021-10-26 NOTE — Evaluation (Addendum)
Physical Therapy Evaluation Patient Details Name: Joseph [name] MRN: [id number] DOB: 27-Sep-1956 Today's Date: 10/26/2021  History of Present Illness  Joseph [name] is a 65 y.o. male with medical history significant for COPD, coronary artery disease, diabetes mellitus, hypertension, systolic CHF with ischemic cardiomyopathy, AICD.  Patient was brought to the ED via EMS. An off duty paramedic saw patient reporting in his car in respiratory distress.  Per EMS, patient's O2 sats on 6 L was in the mid 80s.  At the time of my evaluation, patient is on BiPAP, reports his breathing has improved.  Reports he started having productive cough 3 days ago.  The difficulty breathing started today.  He checks his weight regularly and his weight is unchanged.  He has chronic lower extremity swelling which is also unchanged.  No chest pain.  He is on torsemide twice weekly and compliant.  He did not get the influenza vaccine.  Patient was brought to the ED on CPAP.   Clinical Impression  Patient presents in bed awake, alert, and agreeable for therapy on 2 LPM of O2. Patient functioning near baseline for functional mobility and gait other than mild generalized weakness. Patient ambulated in room on room air w/out AD, demonstrated mild unsteadiness on feet w/ first few steps, steadiness improved w/ continued ambulation in room. Patient able to ambulate in room and manage IV pole, demonstrated steadiness on feet, no loss of balance or complaints other than feeling slightly weak still, SpO2 dropped from 94% to 88% and heart rate rose to 120 bpm, both SpO2 and heart rate improved quickly w/ rest. Patient's SpO2 reads 95% in bed on room air after ambulation, supplemental O2 donned per patient request. Patient encouraged to ambulate in room and hallway w/ nursing, to maintain and improve strength. Nurse notified of SpO2 and heart rate readings. Patient discharged to care of nursing for ambulation daily as tolerated for length of  stay.         Recommendations for follow up therapy are one component of a multi-disciplinary discharge planning process, led by the attending physician.  Recommendations may be updated based on patient status, additional functional criteria and insurance authorization.  Follow Up Recommendations No PT follow up    Assistance Recommended at Discharge None  Functional Status Assessment Patient has not had a recent decline in their functional status  Equipment Recommendations  None recommended by PT    Recommendations for Other Services       Precautions / Restrictions Precautions Precautions: None Restrictions Weight Bearing Restrictions: No      Mobility  Bed Mobility Overal bed mobility: Independent             General bed mobility comments: HOB flat, normal movement    Transfers Overall transfer level: Modified independent Equipment used: None               General transfer comment: Slightly increased time, mild unsteadiness on feet during first few steps, no loss of balance, steadiness improved as patient ambulated in room    Ambulation/Gait Ambulation/Gait assistance: Modified independent (Device/Increase time) Gait Distance (Feet): 25 Feet Assistive device: IV Pole Gait Pattern/deviations: WFL(Within Functional Limits) Gait velocity: Near basline, slightly decreased     General Gait Details: Near baseline, slightly slow cadence, steady on feet using IV pole in room,  reports still feeling a little weak  Stairs            Wheelchair Mobility    Modified Rankin (Stroke Patients Only)  Balance Overall balance assessment: Modified Independent                                           Pertinent Vitals/Pain Pain Assessment: Faces Pain Score: 0-No pain    Home Living Family/patient expects to be discharged to:: Private residence Living Arrangements: Spouse/significant other Available Help at Discharge:  Family;Available PRN/intermittently Type of Home: Mobile home Home Access: Stairs to enter Entrance Stairs-Rails: Left Entrance Stairs-Number of Steps: 5-6   Home Layout: One level Home Equipment: Cane - quad;Cane - single point      Prior Function Prior Level of Function : Independent/Modified Independent;Driving             Mobility Comments: Independent community ambulator, no AD use, drives ADLs Comments: Independent     Hand Dominance        Extremity/Trunk Assessment   Upper Extremity Assessment Upper Extremity Assessment: Overall WFL for tasks assessed    Lower Extremity Assessment Lower Extremity Assessment: Generalized weakness;Overall [employer] for tasks assessed    Cervical / Trunk Assessment Cervical / Trunk Assessment: Normal  Communication   Communication: No difficulties  Cognition Arousal/Alertness: Awake/alert Behavior During Therapy: WFL for tasks assessed/performed Overall Cognitive Status: Within Functional Limits for tasks assessed                                          General Comments      Exercises     Assessment/Plan    PT Assessment Patient does not need any further PT services  PT Problem List         PT Treatment Interventions      PT Goals (Current goals can be found in the Care Plan section)  Acute Rehab PT Goals Patient Stated Goal: Return home. PT Goal Formulation: With patient Time For Goal Achievement: 10/28/21 Potential to Achieve Goals: Good    Frequency     Barriers to discharge        Co-evaluation               AM-PAC PT "6 Clicks" Mobility  Outcome Measure Help needed turning from your back to your side while in a flat bed without using bedrails?: None Help needed moving from lying on your back to sitting on the side of a flat bed without using bedrails?: None Help needed moving to and from a bed to a chair (including a wheelchair)?: None Help needed standing up from a chair  using your arms (e.g., wheelchair or bedside chair)?: None Help needed to walk in hospital room?: None Help needed climbing 3-5 steps with a railing? : None 6 Click Score: 24    End of Session Equipment Utilized During Treatment: Oxygen Activity Tolerance: Patient tolerated treatment well Patient left: in bed;with call bell/phone within reach Nurse Communication: Mobility status PT Visit Diagnosis: Unsteadiness on feet (R26.81);Other abnormalities of gait and mobility (R26.89);Muscle weakness (generalized) (M62.81)    Time: [id number] PT Time Calculation (min) (ACUTE ONLY): 23 min   Charges:   PT Evaluation $PT Eval Moderate Complexity: 1 Mod PT Treatments $Therapeutic Activity: 23-37 mins        Cassie Jones, SPT  During this treatment session, the therapist was present, participating in and directing the treatment.  1:39 PM, 10/26/21 Lonell Grandchild, MPT  Physical Therapist with [facility] 336 [phone] office [phone] mobile phone

## 2021-10-26 NOTE — Progress Notes (Signed)
Had extensive conversation with patient regarding PICC insertion. Risks and benefits reviewed with patient and questions answered. Patient expressed concern and nervousness regarding PICC. Patient stated he has had PICC placed before and was hoping to not have another. I explained to patient the doctors and nurses concern for patient potentially decompensating again and losing IV access again. Informed patient of potential for needing emergency access such as an IO or CVC in the IJ/Femoral if he were to decompensate and lose IV access again. Patient expressed understanding and would still prefer not to have PICC place. Gave patient option to have midline placed instead with risks and benefits explained as well. Patient is more open for midline vs PICC. Dr. Arbutus Leas and primary RN notified.

## 2021-10-26 NOTE — Progress Notes (Addendum)
Progress Note  Patient Name: Joseph [name] Date of Encounter: 10/26/2021  [facility] Cardiologist: Minus Breeding, MD   Subjective   Yesterday evening around 1800, he developed acutely worsening dyspnea and was coughing up pink frothy sputum. Was tachycardiac with HR in the 160's and received IV Adenosine at 6mg  and 12mg  with no rhythm change and rhythm felt to be most consistent with atrial fibrillation. Received IV Cardizem and was started on a Cardizem drip which was stopped at 2200 after conversion back to NSR. IV Amiodarone bolus and drip ordered as well but only received 150 mg of Amiodarone and was never started on the drip. Received IV Lasix 40mg  yesterday evening and another 60mg  this AM.   He reports feeling better this AM. Has responded with IV Lasix. No chest pain this AM. He did not have palpitations with his arrhythmia but developed acutely worsening dyspnea.   Inpatient Medications    Scheduled Meds:  aspirin EC  81 mg Oral QPM   atorvastatin  80 mg Oral QPM   Chlorhexidine Gluconate Cloth  6 each Topical Daily   enoxaparin (LOVENOX) injection  1 mg/kg Subcutaneous Q12H   insulin aspart  0-5 Units Subcutaneous QHS   insulin aspart  0-9 Units Subcutaneous TID WC   mouth rinse  15 mL Mouth Rinse BID   oseltamivir  30 mg Oral BID   predniSONE  40 mg Oral Q breakfast   Continuous Infusions:  cefTRIAXone (ROCEPHIN)  IV 2 g (10/25/21 1734)   diltiazem (CARDIZEM) infusion 5 mg/hr (10/25/21 2251)   doxycycline (VIBRAMYCIN) IV 100 mg (10/26/21 0713)   promethazine (PHENERGAN) injection (IM or IVPB)     PRN Meds: acetaminophen **OR** acetaminophen, albuterol, polyethylene glycol, promethazine (PHENERGAN) injection (IM or IVPB)   Vital Signs    Vitals:   10/26/21 0430 10/26/21 0500 10/26/21 0700 10/26/21 0730  BP: 134/73 (!) 102/49 (!) 146/50 132/64  Pulse: 86 83 69 79  Resp: 17 19 13  (!) 21  Temp:      TempSrc:      SpO2: 100% 98% 98% 97%  Weight:       Height:        Intake/Output Summary (Last 24 hours) at 10/26/2021 0801 Last data filed at 10/26/2021 0500 Gross per 24 hour  Intake 575.85 ml  Output 1200 ml  Net -624.15 ml   Last 3 Weights 10/25/2021 10/17/2021 10/07/2021  Weight (lbs) 177 lb 7.5 oz 177 lb 177 lb  Weight (kg) 80.5 kg 80.287 kg 80.287 kg      Telemetry    Atrial fibrillation with RVR last night but converted back to NSR around 2200. NSR, HR in 70's to 80's this AM. Frequent PVC's and episodes of NSVT.  - Personally Reviewed  ECG    Narrow-complex tachycardia, HR 153 with LBBB.  - Personally Reviewed  Physical Exam   GEN: Pleasant male currently in no acute distress.   Neck: JVD elevated. Cardiac: RRR with frequent ectopic beats, no murmurs, rubs, or gallops.  Respiratory: Decreased breath sounds along bases bilaterally.  GI: Soft, nontender, non-distended  MS: 1+ pitting edema; No deformity. Neuro:  Nonfocal  Psych: Normal affect   Labs    High Sensitivity Troponin:   Recent Labs  Lab 10/25/21 0121 10/25/21 0708 10/25/21 1945 10/26/21 0140 10/26/21 0655  TROPONINIHS 466* 365* 281* 396* 305*     Chemistry Recent Labs  Lab 10/08/2021 1828 09/27/2021 2306 10/25/21 0121 10/25/21 1945 10/26/21 0140  NA 134*  --  137 132* 137  K 4.3  --  4.4 5.1 4.4  CL 103  --  106 104 108  CO2 17*  --  18* 14* 17*  GLUCOSE 199*  --  127* 386* 129*  BUN 33*  --  34* 39* 46*  CREATININE 2.01*  --  1.80* 2.30* 2.07*  CALCIUM 9.2  --  8.1* 8.1* 8.3*  MG  --  1.9  --  2.3 2.3  PROT 8.3*  --   --  6.7  --   ALBUMIN 4.1  --   --  3.0*  --   AST 40  --   --  56*  --   ALT 36  --   --  46*  --   ALKPHOS 121  --   --  103  --   BILITOT 0.7  --   --  0.4  --   GFRNONAA 36*  --  41* 30* 34*  ANIONGAP 14  --  13 14 12     Lipids No results for input(s): CHOL, TRIG, HDL, LABVLDL, LDLCALC, CHOLHDL in the last 168 hours.  Hematology Recent Labs  Lab 09/28/2021 1828 10/25/21 0121 10/25/21 1945  WBC 11.8* 12.0*  17.1*  RBC 6.14* 4.71 5.48  HGB 17.9* 14.2 16.2  HCT 58.7* 42.4 50.7  MCV 95.6 90.0 92.5  MCH 29.2 30.1 29.6  MCHC 30.5 33.5 32.0  RDW 13.5 13.3 13.3  PLT 186 166 292   Thyroid No results for input(s): TSH, FREET4 in the last 168 hours.  BNP Recent Labs  Lab 09/28/2021 1909 10/25/21 1945  BNP 243.0* 383.0*    DDimer  Recent Labs  Lab 10/25/21 1945  DDIMER 2.30*     Radiology    CT CHEST WO CONTRAST  Result Date: 10/21/2021 CLINICAL DATA:  Respiratory failure with hypoxia, hypercapnia, influenza infection and elevated creatinine. EXAM: CT CHEST WITHOUT CONTRAST TECHNIQUE: Multidetector CT imaging of the chest was performed following the standard protocol without IV contrast. COMPARISON:  Portable chest today, portable chest 08/23/2021, and CTA chest 03/12/2010. FINDINGS: Cardiovascular: Mild cardiomegaly with left chamber predominance is again noted with pacemaker wiring in the heart and left chest battery again causing streak artifact. Native coronary arteries are heavily calcified with again noted old CABG and healed sternotomy changes. There is no pericardial effusion. There is mild distention of the superior pulmonary veins compared to the prior CTA, and interval increased slight prominence of the pulmonary trunk measuring 3.1 cm suggesting arterial hypertension. There is patchy calcification in the aorta and great vessels but no aortic aneurysm. Mediastinum/Nodes: No enlarged mediastinal or axillary lymph nodes. Thyroid gland, trachea, and esophagus demonstrate no significant findings. There is a small to moderate hiatal hernia again noted however, which contains mostly fat. Lungs/Pleura: There are patchy ground-glass opacities in the upper lobes which predominate in the peripheral lungs, nonlocalizing faint ground-glass opacities in the infrahilar lower lobes, and increased opacity in the posterior lower lobes alongside small to moderate-sized pleural effusions which could be  atelectasis or consolidation. There is interstitial prominence in the lung apices and bases and in the lower zonal parahilar regions, with a distribution most suggestive of interstitial edema. The central airways show no focal filling defects but do show bronchial thickening in the lower lobes. Upper Abdomen: No acute abnormality.  Old cholecystectomy. Musculoskeletal: No chest wall mass or suspicious bone lesions identified. IMPRESSION: 1. Cardiomegaly with interstitial edema and small to moderate symmetric layering pleural effusions consistent with CHF or fluid overload.  2. Multifocal lung opacities. Opacities alongside the pleural effusions could be atelectasis or consolidation. Peripheral-predominant ground-glass opacities in the upper lobes are probably due to pneumonitis including viral etiologies. Ground-glass central opacities are likely ground-glass edema and there is bronchial thickening in both lower lobes. 3. Calcific CAD with remote CABG and pacing system. 4. Slightly prominent pulmonary trunk but no right heart chamber enlargement. 5. Follow-up study recommended after treatment to ensure clearing of opacities. Electronically Signed   By: Telford Nab M.D.   On: 10/12/2021 23:00   DG CHEST PORT 1 VIEW  Result Date: 10/25/2021 CLINICAL DATA:  Shortness of breath.  History of COPD. EXAM: PORTABLE CHEST 1 VIEW COMPARISON:  Chest x-ray 10/16/2021.  CT chest 09/27/2021. FINDINGS: There are increasing patchy airspace opacities in the lower lungs bilaterally. Patchy slightly nodular opacities in the mid lungs persists, unchanged from prior CT. Small pleural effusions are present. There is no pneumothorax. The cardiomediastinal silhouette is stable, the heart is enlarged. Patient is status post cardiac surgery. Left-sided pacemaker is present. There are no acute fractures. IMPRESSION: 1. Increasing bilateral airspace disease in the lower lobes. 2. Stable small pleural effusions. Electronically Signed    By: Ronney Asters M.D.   On: 10/25/2021 19:27   DG Chest Portable 1 View  Result Date: 10/23/2021 CLINICAL DATA:  Respiratory distress. EXAM: PORTABLE CHEST 1 VIEW COMPARISON:  Chest x-rays dated 08/23/2021 and 05/03/2021. FINDINGS: Mildly increased interstitial markings bilaterally compared to the earlier exams, suggesting acute interstitial edema/thickening superimposed on chronic interstitial lung disease/fibrosis. No confluent opacity to suggest a consolidating pneumonia. No pleural effusion or pneumothorax is seen. Heart size and mediastinal contours are stable. LEFT chest wall pacemaker/ICD apparatus appears stable in position. Osseous structures about the chest are unremarkable. IMPRESSION: New interstitial prominence bilaterally indicating mild volume overload versus multifocal atypical pneumonia, superimposed on chronic interstitial lung disease/fibrosis. Electronically Signed   By: Franki Cabot M.D.   On: 10/22/2021 19:08     Cardiac Studies   Echocardiogram: 10/25/2021 IMPRESSIONS    1. Left ventricular ejection fraction, by estimation, is 35 to 40%. The  left ventricle has moderately decreased function. The left ventricle  demonstrates regional wall motion abnormalities (see scoring  diagram/findings for description). The  mid-to-apical anteroseptal LV wall segments appear akinetic. The rest of  the LV segments appear mildly-to-moderately hypokinetic. The left  ventricular internal cavity size was mildly dilated. Indeterminate  diastolic filling due to E-A fusion.   2. Right ventricular systolic function is normal. The right ventricular  size is normal. Tricuspid regurgitation signal is inadequate for assessing  PA pressure.   3. There is tethering of the posterior mitral valve leaflet with  resultant moderate, posteriorly directed mitral regurgitation   4. The aortic valve is tricuspid. There is mild calcification of the  aortic valve. There is mild thickening of the aortic  valve. Aortic valve  regurgitation is not visualized.   5. The inferior vena cava is dilated in size with >50% respiratory  variability, suggesting right atrial pressure of 8 mmHg.   6. Left atrial size was severely dilated.   7. Right atrial size was moderately dilated.   Patient Profile     65 y.o. male w/ PMH of CAD (s/p CABG in 2000, patent grafts by cath in 2011 and patent grafts by repeat cath in 08/2020), HFrEF (EF 25-30% in 2016, at 35-40% by echo in 10/2017, 30-35% in 01/2019, and 35-40% by echo in 08/2020, s/p [employer] ICD placement in 2011), HTN,  HLD, Type 2 DM (diet-controlled) and Stage 3 CKD who is currently admitted for Influenza A and PNA. Cardiology initially consulted for elevated troponin values.   Assessment & Plan   1. Elevated Troponin Values - Hs Troponin values peaked at 466 on admission. Were rechecked yesterday following his acute hypoxic episode and tachycardia with values overall flat at 396 and 305. Repeat echo this admission shows his EF is similar to prior values at 35-40%. No plans for further ischemic testing at this time.   2. HFrEF - He has a known reduced EF of 35-40% by echo in 08/2020 and repeat echo this admission is similar. He did have worsening dyspnea yesterday evening concerning for flash pulmonary edema in the setting of his tachycardia. He has received 40mg  of IV Lasix following by 60mg  this AM. Creatinine had trended up to 2.30 but improved to 2.07 this AM. Will follow diuresis this morning and consider another 40mg  this afternoon. Will restart his PTA Toprol-XL but at a lower dose of 25mg  daily. He was on Jardiance 10 mg daily, Enalapril 20 mg twice daily and Spironolactone 12.5mg  daily prior to admission and will add back as BP improves and renal function stabilizes.   3. Atrial Flutter/Fibrillation with RVR/Frequent PVC's - Went into an atrial tachycardia/flutter during the evening hours of 11/29 and converted back to NSR within 4 hours. In NSR  overnight and this AM but still having frequent ectopy. Remains on IV Cardizem at 5mg /hr but this is not ideal given his cardiomyopathy. Reviewed with Dr. [name] and will stop and switch to IV Amiodarone and restart Toprol-XL 25mg  daily. If he continues to have frequent ectopy this admission, may need to consider a cMRI to rule-out myocarditis in the setting of Influenza.  - This patients CHA2DS2-VASc Score and unadjusted Ischemic Stroke Rate (% per year) is equal to 4.8 % stroke rate/year from a score of 4 (CHF, HTN, Vascular, Age). He has been started on full-dose Lovenox by the admitting team. Anticipate he will not require long-term anticoagulation unless he continues to have intermittent arrhythmia issues this admission.    4. CAD - He underwent CABG in 2000 with patent grafts by cath in 2011 and patent grafts by repeat cath in 08/2020. Repeat echo this admission shows his EF is overall unchanged.  - Continue ASA and statin therapy. Will restart Toprol-XL at a lower dose of 25mg  daily as outlined above.   5. Mital Regurgitation - Noted to have moderate MR by echo this admission. Will continue to follow as an outpatient.    6. Acute on Chronic Stage 3 CKD - Baseline creatinine 1.4 - 1.5. Peaked at 2.30 yesterday and improved to 2.07 this AM with diuresis. Continue to follow with diuresis.    7. Influenza A/PNA - Being followed by the admitting team and receiving Ceftriaxone, Doxycycline and Tamiflu. D-dimer was elevated to 2.30 yesterday. Will defer further evaluation to the admitting team. Not a candidate for a CTA currently given his AKI but could consider a VQ Scan later this admission if indicated. He has been started on full-dose Lovenox.    For questions or updates, please contact [place] Please consult [email or web address] for contact info under      Signed, Erma Heritage, PA-C  10/26/2021, 8:01 AM     Attending note:  Patient seen and examined.  I reviewed the chart and  interval hospital course including rhythm changes late yesterday.  Joseph [name] is in sinus rhythm this morning with occasional to  frequent ventricular ectopy.  He is afebrile with systolic blood pressure recently ranging 100-140.  States that he is breathing more comfortably today, no chest pain.  Still with intermittent cough.  On examination he has decreased breath sounds at the bases with scattered rhonchi.  Cardiac exam reveals indistinct PMI, RRR with ectopy, 2/6 apical systolic murmur.  No pitting edema.  Net urine output of approximately 600 cc last 24 hours, received a dose of IV Lasix around 4 AM.  Pertinent lab work includes potassium 4.4, creatinine 2.07 down from 2.30, peak high-sensitivity troponin 07/31/1995, BNP 383, WBC 17, hemoglobin 16.2, D-dimer 2.3 (at this point doubt pulmonary embolus in light of current clinical presentation).  Follow-up chest x-ray yesterday reported increasing bilateral airspace disease in the lower lobes with stable small pleural effusions.  I reviewed telemetry from yesterday evening.  It looks like he was in atypical atrial flutter initially, then atrial fibrillation, and ultimately back to sinus rhythm.  Echocardiogram yesterday revealed LVEF 35 to 40% range with wall motion abnormalities consistent with history of ischemic cardiomyopathy, RV contraction normal, unable to estimate RVSP.  Also moderate, posteriorly directed mitral regurgitation with severe left atrial enlargement.  Plan at this time is to discontinue IV diltiazem which was initiated last night, resume Toprol-XL 25 mg daily and titrate as able, initiate IV amiodarone to help suppress recurrent atrial arrhythmias in the short-term.  Follow urine output in case he needs additional dose of Lasix later today.  Hold off on resuming ACE inhibitor, Jardiance, and Aldactone with AKI.  Nursing reports difficulty with IV access, would suggest PICC line.  Otherwise, he is on treatment dose Lovenox for the  time being, aspirin, and Lipitor.  Jonelle Sidle, M.D., F.A.C.C.

## 2021-10-26 NOTE — Progress Notes (Signed)
PROGRESS NOTE  Joseph [name] [id number] DOB: 07-09-1956 DOA: 10/04/2021 PCP: [name], [name], MD  Brief History:  65 year old male with a history of COPD, CAD, diabetes, systolic congestive heart failure, admitted to the hospital with respiratory distress.  Initially required BiPAP for respiratory acidosis and hypoxia.  He tested positive for influenza A.  Started on Tamiflu/antibiotics as well as treatment for COPD exacerbation with bronchodilators and steroids.  Noted to have elevated troponin and possible some signs of pulmonary edema.  He was started on IV lasix.  Cardiology following.  Unfortunately, he continued to have bouts of afib with RVR during which he continued to have sob.  He was started on amiodarone drip and metoprolol succinate.    Assessment/Plan: Acute respiratory failure with hypoxia and hypercapnia -Likely multifactorial in the setting of influenza A infection, COPD and CHF exacerbation -Patient placed on BiPAP with improvement of acidosis -Clinically appears to be improving, will give a trial off of BiPAP and placed on nasal cannula -He is not chronically on oxygen -11/30 pm--developed RVR again>>given additional amiodarone bolus and increased amio drip to 60 mg--discussed with Dr. [name] -11/28 CT chest--CM with interstitial edema and small-mod pleural effusions;  multifocal lung opacities along pleural effusions, peripheral GGO with LL bronchial wall thickening.   Multifocal pneumonia/Influenza -Likely related to influenza -He is on Tamiflu -Procalcitonin mildly elevated at 1.16 -Due to severity of illness, continue ceftriaxone and doxy -repeat PCT in am -MRSA screen neg   COPD with acute exacerbation -Started on intravenous steroids, bronchodilators -If continued respiratory improvement, can consider de-escalating steroids tomorrow prednisone taper -start pulmicort, xopenex, atrovent  Atrial Flutter/Atrial fibrillation with RVR -11/30  afternoon--rebolus amio and increase drip rate -lopressor IV 2.5 mg x 2 given 11/30 mid afternoon   Elevated troponin -Suspect this may be demand ischemia in the setting of acute respiratory illness -11/29 Echo  EF 35-40%+WMA, mod MR, trivial TR -Appreciate cardiology input  Hemoptysis -hold lovenox temporarily -V/Q scan if stable off BiPAP   Chronic systolic congestive heart failure -Last echocardiogram from 2020 shows EF of 30 to 35% -11/29 Echo  EF 35-40%+WMA, mod MR, trivial TR -Status post AICD -He is chronically on enalapril, spironolactone and metoprolol.  These have been held due to low blood pressures initially -Chronically takes torsemide, also held due to low blood pressures initially -received lasix 40 IV 11/29 and 60 mg IV 76m 11/30  AKI on CKD stage IIIa -Baseline creatinine appears 1.4-1.6 -Admission creatinine noted to be 2.0 -Possibly related to hypotension on admission -He received 1 L bolus of IV fluids in the emergency room -Continue to hold ACE inhibitors   Coronary artery disease status post CABG -Continue aspirin and Lipitor -underwent CABG in 2000 with patent grafts by cath in 2011 and patent grafts by repeat cath in 08/2020   Diabetes type 2, non-insulin-dependent -Chronically on Jardiance -Continue on sliding scale for now -Monitor blood sugars -08/30/21 A1C--5.6      Status is: Inpatient  Remains inpatient appropriate because: due to severity of illness requiring BiPAP        Family Communication:   no Family at bedside  Consultants:  cardiology  Code Status:  FULL   DVT Prophylaxis:  [place] Lovenox   Procedures: As Listed in Progress Note Above  Antibiotics: Ceftriaxone 11/28>> Doxy 11/28>>       Subjective: Patient complains of sob, and cough with blood tinged sputum.  Denies cp, n/v/d, abd pain, f/c  Objective: Vitals:   10/26/21 1230 10/26/21 1300 10/26/21 1330 10/26/21 1400  BP: 117/64 (!) 116/52 136/82 (!) 156/84   Pulse: 79 78 (!) 108 (!) 129  Resp: 15 20 13  (!) 25  Temp:      TempSrc:      SpO2: 99% 97% 97% 100%  Weight:      Height:        Intake/Output Summary (Last 24 hours) at 10/26/2021 1429 Last data filed at 10/26/2021 1414 Gross per 24 hour  Intake 585.85 ml  Output 2175 ml  Net -1589.15 ml   Weight change:  Exam:  General:  Pt is alert, follows commands appropriately, not in acute distress HEENT: No icterus, No thrush, No neck mass, [place]/AT Cardiovascular: IRRR, S1/S2, no rubs, no gallops Respiratory: bibasilar rales, R>L Abdomen: Soft/+BS, non tender, non distended, no guarding Extremities: trace LE edema, No lymphangitis, No petechiae, No rashes, no synovitis   Data Reviewed: I have personally reviewed following labs and imaging studies Basic Metabolic Panel: Recent Labs  Lab 10/07/2021 1828 10/09/2021 2306 10/25/21 0121 10/25/21 1945 10/26/21 0140  NA 134*  --  137 132* 137  K 4.3  --  4.4 5.1 4.4  CL 103  --  106 104 108  CO2 17*  --  18* 14* 17*  GLUCOSE 199*  --  127* 386* 129*  BUN 33*  --  34* 39* 46*  CREATININE 2.01*  --  1.80* 2.30* 2.07*  CALCIUM 9.2  --  8.1* 8.1* 8.3*  MG  --  1.9  --  2.3 2.3   Liver Function Tests: Recent Labs  Lab 10/10/2021 1828 10/25/21 1945  AST 40 56*  ALT 36 46*  ALKPHOS 121 103  BILITOT 0.7 0.4  PROT 8.3* 6.7  ALBUMIN 4.1 3.0*   No results for input(s): LIPASE, AMYLASE in the last 168 hours. No results for input(s): AMMONIA in the last 168 hours. Coagulation Profile: No results for input(s): INR, PROTIME in the last 168 hours. CBC: Recent Labs  Lab 09/27/2021 1828 10/25/21 0121 10/25/21 1945  WBC 11.8* 12.0* 17.1*  NEUTROABS 5.8  --   --   HGB 17.9* 14.2 16.2  HCT 58.7* 42.4 50.7  MCV 95.6 90.0 92.5  PLT 186 166 292   Cardiac Enzymes: No results for input(s): CKTOTAL, CKMB, CKMBINDEX, TROPONINI in the last 168 hours. BNP: Invalid input(s): POCBNP CBG: Recent Labs  Lab 10/25/21 1607 10/25/21 1841  10/25/21 2130 10/26/21 0759 10/26/21 1127  GLUCAP 184* 247* 233* 163* 164*   HbA1C: No results for input(s): HGBA1C in the last 72 hours. Urine analysis:    Component Value Date/Time   COLORURINE YELLOW 03/12/2010 1655   APPEARANCEUR CLEAR 03/12/2010 1655   LABSPEC 1.010 03/12/2010 1655   PHURINE 6.0 03/12/2010 1655   GLUCOSEU NEGATIVE 03/12/2010 1655   HGBUR MODERATE (A) 03/12/2010 1655   BILIRUBINUR NEGATIVE 03/12/2010 1655   KETONESUR TRACE (A) 03/12/2010 1655   PROTEINUR 30 (A) 03/12/2010 1655   UROBILINOGEN 1.0 03/12/2010 1655   NITRITE NEGATIVE 03/12/2010 1655   LEUKOCYTESUR NEGATIVE 03/12/2010 1655   Sepsis Labs: @LABRCNTIP (procalcitonin:4,lacticidven:4) ) Recent Results (from the past 240 hour(s))  Resp Panel by RT-PCR (Flu A&B, Covid) Nasopharyngeal Swab     Status: Abnormal   Collection Time: 09/27/2021  6:28 PM   Specimen: Nasopharyngeal Swab; Nasopharyngeal(NP) swabs in vial transport medium  Result Value Ref Range Status   SARS Coronavirus 2 by RT PCR NEGATIVE NEGATIVE Final    Comment: (NOTE) SARS-CoV-2  target nucleic acids are NOT DETECTED.  The SARS-CoV-2 RNA is generally detectable in upper respiratory specimens during the acute phase of infection. The lowest concentration of SARS-CoV-2 viral copies this assay can detect is 138 copies/mL. A negative result does not preclude SARS-Cov-2 infection and should not be used as the sole basis for treatment or other patient management decisions. A negative result may occur with  improper specimen collection/handling, submission of specimen other than nasopharyngeal swab, presence of viral mutation(s) within the areas targeted by this assay, and inadequate number of viral copies(<138 copies/mL). A negative result must be combined with clinical observations, patient history, and epidemiological information. The expected result is Negative.  Fact Sheet for Patients:   EntrepreneurPulse.com.au  Fact Sheet for Healthcare Providers:  IncredibleEmployment.be  This test is no t yet approved or cleared by the [place] FDA and  has been authorized for detection and/or diagnosis of SARS-CoV-2 by FDA under an Emergency Use Authorization (EUA). This EUA will remain  in effect (meaning this test can be used) for the duration of the COVID-19 declaration under Section 564(b)(1) of the Act, 21 U.S.C.section 360bbb-3(b)(1), unless the authorization is terminated  or revoked sooner.       Influenza A by PCR POSITIVE (A) NEGATIVE Final   Influenza B by PCR NEGATIVE NEGATIVE Final    Comment: (NOTE) The Xpert Xpress SARS-CoV-2/FLU/RSV plus assay is intended as an aid in the diagnosis of influenza from Nasopharyngeal swab specimens and should not be used as a sole basis for treatment. Nasal washings and aspirates are unacceptable for Xpert Xpress SARS-CoV-2/FLU/RSV testing.  Fact Sheet for Patients: EntrepreneurPulse.com.au  Fact Sheet for Healthcare Providers: IncredibleEmployment.be  This test is not yet approved or cleared by the [place] FDA and has been authorized for detection and/or diagnosis of SARS-CoV-2 by FDA under an Emergency Use Authorization (EUA). This EUA will remain in effect (meaning this test can be used) for the duration of the COVID-19 declaration under Section 564(b)(1) of the Act, 21 U.S.C. section 360bbb-3(b)(1), unless the authorization is terminated or revoked.  Performed at [facility], [address], [place], [place] [address]   Culture, blood (routine x 2)     Status: None (Preliminary result)   Collection Time: 10/09/2021  7:09 PM   Specimen: Left Antecubital; Blood  Result Value Ref Range Status   Specimen Description LEFT ANTECUBITAL  Final   Special Requests   Final    BOTTLES DRAWN AEROBIC AND ANAEROBIC Blood Culture results may not be  optimal due to an inadequate volume of blood received in culture bottles   Culture   Final    NO GROWTH 2 DAYS Performed at [facility], [address] [place], [place] [address]    Report Status PENDING  Incomplete  Culture, blood (routine x 2)     Status: None (Preliminary result)   Collection Time: 10/02/2021  7:11 PM   Specimen: BLOOD RIGHT FOREARM  Result Value Ref Range Status   Specimen Description BLOOD RIGHT FOREARM  Final   Special Requests   Final    BOTTLES DRAWN AEROBIC AND ANAEROBIC Blood Culture results may not be optimal due to an inadequate volume of blood received in culture bottles   Culture   Final    NO GROWTH 2 DAYS Performed at [facility], [address], [place], [place] [address]    Report Status PENDING  Incomplete  MRSA Next Gen by PCR, Nasal     Status: None   Collection Time:  10/25/21 12:32 AM   Specimen: Nasal Mucosa; Nasal Swab  Result Value Ref Range Status   MRSA by PCR Next Gen NOT DETECTED NOT DETECTED Final    Comment: (NOTE) The GeneXpert MRSA Assay (FDA approved for NASAL specimens only), is one component of a comprehensive MRSA colonization surveillance program. It is not intended to diagnose MRSA infection nor to guide or monitor treatment for MRSA infections. Test performance is not FDA approved in patients less than 2 years old. Performed at [facility], [address] [place], [place] [address]      Scheduled Meds:  aspirin EC  81 mg Oral QPM   atorvastatin  80 mg Oral QPM   Chlorhexidine Gluconate Cloth  6 each Topical Daily   enoxaparin (LOVENOX) injection  1 mg/kg Subcutaneous Q12H   insulin aspart  0-5 Units Subcutaneous QHS   insulin aspart  0-9 Units Subcutaneous TID WC   mouth rinse  15 mL Mouth Rinse BID   metoprolol succinate  25 mg Oral Daily   oseltamivir  30 mg Oral BID   predniSONE  40 mg Oral Q breakfast   sodium chloride flush  10-40 mL Intracatheter Q12H   Continuous Infusions:  amiodarone 60 mg/hr (10/26/21  0830)   amiodarone 30 mg/hr (10/26/21 1411)   cefTRIAXone (ROCEPHIN)  IV 2 g (10/25/21 1734)   doxycycline (VIBRAMYCIN) IV 100 mg (10/26/21 0713)   promethazine (PHENERGAN) injection (IM or IVPB)      Procedures/Studies: CT CHEST WO CONTRAST  Result Date: 10/21/2021 CLINICAL DATA:  Respiratory failure with hypoxia, hypercapnia, influenza infection and elevated creatinine. EXAM: CT CHEST WITHOUT CONTRAST TECHNIQUE: Multidetector CT imaging of the chest was performed following the standard protocol without IV contrast. COMPARISON:  Portable chest today, portable chest 08/23/2021, and CTA chest 03/12/2010. FINDINGS: Cardiovascular: Mild cardiomegaly with left chamber predominance is again noted with pacemaker wiring in the heart and left chest battery again causing streak artifact. Native coronary arteries are heavily calcified with again noted old CABG and healed sternotomy changes. There is no pericardial effusion. There is mild distention of the superior pulmonary veins compared to the prior CTA, and interval increased slight prominence of the pulmonary trunk measuring 3.1 cm suggesting arterial hypertension. There is patchy calcification in the aorta and great vessels but no aortic aneurysm. Mediastinum/Nodes: No enlarged mediastinal or axillary lymph nodes. Thyroid gland, trachea, and esophagus demonstrate no significant findings. There is a small to moderate hiatal hernia again noted however, which contains mostly fat. Lungs/Pleura: There are patchy ground-glass opacities in the upper lobes which predominate in the peripheral lungs, nonlocalizing faint ground-glass opacities in the infrahilar lower lobes, and increased opacity in the posterior lower lobes alongside small to moderate-sized pleural effusions which could be atelectasis or consolidation. There is interstitial prominence in the lung apices and bases and in the lower zonal parahilar regions, with a distribution most suggestive of  interstitial edema. The central airways show no focal filling defects but do show bronchial thickening in the lower lobes. Upper Abdomen: No acute abnormality.  Old cholecystectomy. Musculoskeletal: No chest wall mass or suspicious bone lesions identified. IMPRESSION: 1. Cardiomegaly with interstitial edema and small to moderate symmetric layering pleural effusions consistent with CHF or fluid overload. 2. Multifocal lung opacities. Opacities alongside the pleural effusions could be atelectasis or consolidation. Peripheral-predominant ground-glass opacities in the upper lobes are probably due to pneumonitis including viral etiologies. Ground-glass central opacities are likely ground-glass edema and there is bronchial thickening in both lower lobes. 3. Calcific  CAD with remote CABG and pacing system. 4. Slightly prominent pulmonary trunk but no right heart chamber enlargement. 5. Follow-up study recommended after treatment to ensure clearing of opacities. Electronically Signed   By: Almira Bar M.D.   On: 10/15/2021 23:00   DG CHEST PORT 1 VIEW  Result Date: 10/25/2021 CLINICAL DATA:  Shortness of breath.  History of COPD. EXAM: PORTABLE CHEST 1 VIEW COMPARISON:  Chest x-ray 10/11/2021.  CT chest 10/21/2021. FINDINGS: There are increasing patchy airspace opacities in the lower lungs bilaterally. Patchy slightly nodular opacities in the mid lungs persists, unchanged from prior CT. Small pleural effusions are present. There is no pneumothorax. The cardiomediastinal silhouette is stable, the heart is enlarged. Patient is status post cardiac surgery. Left-sided pacemaker is present. There are no acute fractures. IMPRESSION: 1. Increasing bilateral airspace disease in the lower lobes. 2. Stable small pleural effusions. Electronically Signed   By: Darliss Cheney M.D.   On: 10/25/2021 19:27   DG Chest Portable 1 View  Result Date: 10/17/2021 CLINICAL DATA:  Respiratory distress. EXAM: PORTABLE CHEST 1 VIEW  COMPARISON:  Chest x-rays dated 08/23/2021 and 05/03/2021. FINDINGS: Mildly increased interstitial markings bilaterally compared to the earlier exams, suggesting acute interstitial edema/thickening superimposed on chronic interstitial lung disease/fibrosis. No confluent opacity to suggest a consolidating pneumonia. No pleural effusion or pneumothorax is seen. Heart size and mediastinal contours are stable. LEFT chest wall pacemaker/ICD apparatus appears stable in position. Osseous structures about the chest are unremarkable. IMPRESSION: New interstitial prominence bilaterally indicating mild volume overload versus multifocal atypical pneumonia, superimposed on chronic interstitial lung disease/fibrosis. Electronically Signed   By: Bary Richard M.D.   On: 10/04/2021 19:08   ECHOCARDIOGRAM COMPLETE  Result Date: 10/25/2021    ECHOCARDIOGRAM REPORT   Patient Name:   Joseph [name] Date of Exam: 10/25/2021 Medical Rec #:  [id number]     Height:       71.0 in Accession #:    [id number]    Weight:       177.5 lb Date of Birth:  [birth year]      BSA:          2.004 m Patient Age:    65 years      BP:           103/54 mmHg Patient Gender: M             HR:           69 bpm. Exam Location:  Inpatient Procedure: 2D Echo, 3D Echo, Cardiac Doppler and Color Doppler Indications:    R07.9* Chest pain, unspecified. Elevated troponin.  History:        Patient has prior history of Echocardiogram examinations, most                 recent 01/28/2019. Cardiomyopathy and CHF, CAD, Prior CABG,                 Defibrillator and Abnormal ECG, COPD; Risk Factors:Hypertension.                 Patient is flu positive.  Sonographer:    Sheralyn Boatman RDCS Referring Phys: [id number] Heloise Beecham [facility]  Sonographer Comments: Technically difficult study due to poor echo windows. Pateient coughing throughout test. IMPRESSIONS  1. Left ventricular ejection fraction, by estimation, is 35 to 40%. The left ventricle has moderately decreased function. The left  ventricle demonstrates regional wall motion abnormalities (see scoring diagram/findings for description). The mid-to-apical anteroseptal LV wall  segments appear akinetic. The rest of the LV segments appear mildly-to-moderately hypokinetic. The left ventricular internal cavity size was mildly dilated. Indeterminate diastolic filling due to E-A fusion.  2. Right ventricular systolic function is normal. The right ventricular size is normal. Tricuspid regurgitation signal is inadequate for assessing PA pressure.  3. 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 The aortic valve is tricuspid. There is mild calcification of the aortic valve. There is mild thickening of the aortic valve. Aortic valve regurgitation is not visualized.  5. The inferior vena cava is dilated in size with >50% respiratory variability, suggesting right atrial pressure of 8 mmHg.  6. Left atrial size was severely dilated.  7. Right atrial size was moderately dilated. Comparison(s): Compared to prior TTE in 01/2019, the LVEF appears mildly improved to 35-40% (previously 30-35%). The mitral regurgitation now appears moderate (previously mild). FINDINGS  Left Ventricle: Left ventricular ejection fraction, by estimation, is 35 to 40%. The left ventricle has moderately decreased function. The left ventricle demonstrates regional wall motion abnormalities. The mid-to-apical anteroseptal LV wall segments appear akinetic. The rest of the LV segments are mildly-to-moderately hypokinetic. 3D left ventricular ejection fraction analysis performed but not reported based on interpreter judgement due to suboptimal tracking. The left ventricular internal cavity size was mildly dilated. There is no left ventricular hypertrophy. Indeterminate diastolic filling due to E-A fusion. Right Ventricle: The right ventricular size is normal. No increase in right ventricular wall thickness.  Right ventricular systolic function is normal. Tricuspid regurgitation signal is inadequate for assessing PA pressure. Left Atrium: Left atrial size was severely dilated. Right Atrium: Right atrial size was moderately dilated. Pericardium: There is no evidence of pericardial effusion. Mitral Valve: The mitral valve is normal in structure. There is mild thickening of the mitral valve leaflet(s). There is mild calcification of the mitral valve leaflet(s). Mild mitral annular calcification. There is tethering of the posterior mitral valve leafltet with resultant moderate, posteriorly directed mitral regurgitation. MV peak gradient, 10.8 mmHg. The mean mitral valve gradient is 3.0 mmHg. Tricuspid Valve: The tricuspid valve is normal in structure. Tricuspid valve regurgitation is trivial. Aortic Valve: The aortic valve is tricuspid. There is mild calcification of the aortic valve. There is mild thickening of the aortic valve. Aortic valve regurgitation is not visualized. Pulmonic Valve: The pulmonic valve was normal in structure. Pulmonic valve regurgitation is trivial. Aorta: The aortic root and ascending aorta are structurally normal, with no evidence of dilitation. Venous: The inferior vena cava is dilated in size with greater than 50% respiratory variability, suggesting right atrial pressure of 8 mmHg. IAS/Shunts: No atrial level shunt detected by color flow Doppler. Additional Comments: A device lead is visualized.  LEFT VENTRICLE PLAX 2D LVIDd:         3.31 cm      Diastology LVIDs:         2.91 cm      LV e' medial:    7.62 cm/s LV PW:         3.62 cm      LV E/e' medial:  17.1 LV IVS:        0.66 cm      LV e' lateral:   12.40 cm/s LVOT diam:     1.90 cm      LV E/e' lateral: 10.5 LV SV:         46 LV SV Index:   23 LVOT Area:  2.84 cm                              3D Volume EF: LV Volumes (MOD)            3D EF:        35 % LV vol d, MOD A2C: 132.0 ml LV EDV:       207 ml LV vol d, MOD A4C: 133.0 ml LV ESV:        135 ml LV vol s, MOD A2C: 69.6 ml  LV SV:        71 ml LV vol s, MOD A4C: 85.8 ml LV SV MOD A2C:     62.4 ml LV SV MOD A4C:     133.0 ml LV SV MOD BP:      61.6 ml RIGHT VENTRICLE             IVC RV S prime:     11.30 cm/s  IVC diam: 2.20 cm TAPSE (M-mode): 1.1 cm LEFT ATRIUM             Index        RIGHT ATRIUM           Index LA diam:        3.60 cm 1.80 cm/m   RA Area:     12.50 cm LA Vol (A2C):   57.4 ml 28.64 ml/m  RA Volume:   25.30 ml  12.62 ml/m LA Vol (A4C):   54.4 ml 27.14 ml/m LA Biplane Vol: 57.4 ml 28.64 ml/m  AORTIC VALVE LVOT Vmax:   120.00 cm/s LVOT Vmean:  78.400 cm/s LVOT VTI:    0.162 m  AORTA Ao Root diam: 3.30 cm Ao Asc diam:  3.30 cm MITRAL VALVE MV Area (PHT): 2.83 cm       SHUNTS MV Area VTI:   1.71 cm       Systemic VTI:  0.16 m MV Peak grad:  10.8 mmHg      Systemic Diam: 1.90 cm MV Mean grad:  3.0 mmHg MV Vmax:       1.64 m/s MV Vmean:      75.0 cm/s MV Decel Time: 268 msec MR Peak grad:    58.7 mmHg MR Mean grad:    33.0 mmHg MR Vmax:         383.00 cm/s MR Vmean:        269.0 cm/s MR PISA:         1.57 cm MR PISA Eff ROA: 16 mm MR PISA Radius:  0.50 cm MV E velocity: 130.00 cm/s Gwyndolyn Kaufman MD Electronically signed by Gwyndolyn Kaufman MD Signature Date/Time: 10/25/2021/9:14:44 PM    Final    CUP PACEART REMOTE DEVICE CHECK  Result Date: 10/06/2021 Scheduled remote reviewed. Normal device function.  Battery estimated 6.18mo Next remote 12/12 LR  [place] EKG SITE RITE  Result Date: 10/26/2021 If Site Rite image not attached, placement could not be confirmed due to current cardiac rhythm.   Orson Eva, DO  [facility]  If 7PM-7AM, please contact night-coverage [email or web address] Password [email or web address] 10/26/2021, 2:29 PM   LOS: 2 days

## 2021-10-26 NOTE — Progress Notes (Signed)
   Progress Note  Patient Name: Joseph [name] Date of Encounter: 10/26/2021  Primary Cardiologist: Rollene Rotunda, MD  Patient went back into atrial fibrillation/flutter with RVR this afternoon, acutely short of breath and requiring BiPAP.  He was already on IV amiodarone and had received Toprol-XL 25 mg this morning.  I evaluated the patient at the bedside and over the course of 45 minutes had him receive amiodarone 150 mg boluses x2, also treated with IV Lopressor 5 mg ultimately x3.  This achieved much better heart rate control and he appeared more comfortable.  Case discussed with nursing and also hospitalist team.  I also updated his daughter [name].  For now with plan to continue IV amiodarone at 60 mg/h.  He could be given additional Lopressor 5 mg IV doses if needed, would try and avoid Cardizem if at all possible in light of his cardiomyopathy.  I am hopeful that he will convert as he did yesterday.  Would like to avoid electrical cardioversion as he would need sedation and I am not certain that his respiratory status would tolerate this very well.  He has still had coughing productive of sputum, blood-tinged this afternoon.  Lovenox has been held by primary team.  CRITICAL CARE Performed by: Nona Dell   Total critical care time: 45 minutes  Critical care time was exclusive of separately billable procedures and treating other patients.  Critical care was necessary to treat or prevent imminent or life-threatening deterioration.  Critical care was time spent personally by me on the following activities: development of treatment plan with patient and/or surrogate as well as nursing, discussions with consultants, evaluation of patient's response to treatment, examination of patient, obtaining history from patient or surrogate, ordering and performing treatments and interventions, ordering and review of laboratory studies, ordering and review of radiographic studies, pulse  oximetry and re-evaluation of patient's condition.   Signed, Nona Dell, MD  10/26/2021, 4:40 PM

## 2021-10-26 NOTE — Progress Notes (Signed)
At around 2pm, Clinical research associate called to room stating he was starting to feel short of breath. O2 sat on O2 at 4L via [place] still above 92% but patient wanting to put Bipap on. Patient coughing up pink to red colored sputum and heart rate noted to increase to 130-160's. Jaynie Bream PA and Dr Tat made aware. Patient denied any chest pain. Dr Diona Browner and Dr Tat in to assess patient. Multiple IV metoprolol pushes and amio bolus's given per verbal from Dr Diona Browner who was present in the room. Amio continues to infuse. Midline noted to start to be sluggish when flushing and not drawing back blood. Midline removed and new IV access obtained for 2nd line. Dressing to left upper arm where midline was is currently clean, dry and intact. Patient is currently back on 4L O2 via [place] with no current complaints.

## 2021-10-27 ENCOUNTER — Encounter (HOSPITAL_COMMUNITY): Payer: Self-pay | Admitting: Internal Medicine

## 2021-10-27 ENCOUNTER — Inpatient Hospital Stay (HOSPITAL_COMMUNITY): Payer: Medicare HMO

## 2021-10-27 DIAGNOSIS — I4891 Unspecified atrial fibrillation: Secondary | ICD-10-CM | POA: Diagnosis not present

## 2021-10-27 DIAGNOSIS — Z95810 Presence of automatic (implantable) cardiac defibrillator: Secondary | ICD-10-CM | POA: Diagnosis not present

## 2021-10-27 DIAGNOSIS — N170 Acute kidney failure with tubular necrosis: Secondary | ICD-10-CM | POA: Diagnosis not present

## 2021-10-27 DIAGNOSIS — N179 Acute kidney failure, unspecified: Secondary | ICD-10-CM

## 2021-10-27 DIAGNOSIS — J9602 Acute respiratory failure with hypercapnia: Secondary | ICD-10-CM | POA: Diagnosis not present

## 2021-10-27 DIAGNOSIS — J9601 Acute respiratory failure with hypoxia: Secondary | ICD-10-CM | POA: Diagnosis not present

## 2021-10-27 DIAGNOSIS — N1831 Chronic kidney disease, stage 3a: Secondary | ICD-10-CM

## 2021-10-27 LAB — BASIC METABOLIC PANEL
Anion gap: 12 (ref 5–15)
BUN: 51 mg/dL — ABNORMAL HIGH (ref 8–23)
CO2: 18 mmol/L — ABNORMAL LOW (ref 22–32)
Calcium: 8.3 mg/dL — ABNORMAL LOW (ref 8.9–10.3)
Chloride: 105 mmol/L (ref 98–111)
Creatinine, Ser: 1.98 mg/dL — ABNORMAL HIGH (ref 0.61–1.24)
GFR, Estimated: 36 mL/min — ABNORMAL LOW (ref >60)
Glucose, Bld: 136 mg/dL — ABNORMAL HIGH (ref 70–99)
Potassium: 4 mmol/L (ref 3.5–5.1)
Sodium: 135 mmol/L (ref 135–145)

## 2021-10-27 LAB — CBC
HCT: 42.6 % (ref 39.0–52.0)
Hemoglobin: 14.1 g/dL (ref 13.0–17.0)
MCH: 29.3 pg (ref 26.0–34.0)
MCHC: 33.1 g/dL (ref 30.0–36.0)
MCV: 88.6 fL (ref 80.0–100.0)
Platelets: 203 10*3/uL (ref 150–400)
RBC: 4.81 MIL/uL (ref 4.22–5.81)
RDW: 13.5 % (ref 11.5–15.5)
WBC: 17.1 10*3/uL — ABNORMAL HIGH (ref 4.0–10.5)
nRBC: 0 % (ref 0.0–0.2)

## 2021-10-27 LAB — GLUCOSE, CAPILLARY
Glucose-Capillary: 123 mg/dL — ABNORMAL HIGH (ref 70–99)
Glucose-Capillary: 143 mg/dL — ABNORMAL HIGH (ref 70–99)
Glucose-Capillary: 174 mg/dL — ABNORMAL HIGH (ref 70–99)
Glucose-Capillary: 175 mg/dL — ABNORMAL HIGH (ref 70–99)

## 2021-10-27 LAB — PROCALCITONIN: Procalcitonin: 5.05 ng/mL

## 2021-10-27 LAB — MAGNESIUM: Magnesium: 2.3 mg/dL (ref 1.7–2.4)

## 2021-10-27 MED ORDER — METOPROLOL SUCCINATE ER 25 MG PO TB24
25.0000 mg | ORAL_TABLET | Freq: Once | ORAL | Status: AC
  Administered 2021-10-27: 25 mg via ORAL
  Filled 2021-10-27: qty 1

## 2021-10-27 MED ORDER — FUROSEMIDE 10 MG/ML IJ SOLN
40.0000 mg | Freq: Once | INTRAMUSCULAR | Status: AC
  Administered 2021-10-27: 40 mg via INTRAVENOUS
  Filled 2021-10-27: qty 4

## 2021-10-27 MED ORDER — LORAZEPAM 2 MG/ML IJ SOLN
0.5000 mg | Freq: Four times a day (QID) | INTRAMUSCULAR | Status: DC | PRN
  Administered 2021-10-27 – 2021-10-28 (×4): 0.5 mg via INTRAVENOUS
  Filled 2021-10-27 (×4): qty 1

## 2021-10-27 MED ORDER — METOPROLOL TARTRATE 5 MG/5ML IV SOLN
2.5000 mg | Freq: Once | INTRAVENOUS | Status: AC
  Administered 2021-10-27: 2.5 mg via INTRAVENOUS
  Filled 2021-10-27: qty 5

## 2021-10-27 MED ORDER — LORAZEPAM 2 MG/ML IJ SOLN: 0.5000 mg | Freq: Once | INTRAMUSCULAR | Status: AC

## 2021-10-27 MED ORDER — LORAZEPAM BOLUS VIA INFUSION: 0.5000 mg | Freq: Once | INTRAVENOUS | Status: DC

## 2021-10-27 MED ORDER — IPRATROPIUM BROMIDE 0.02 % IN SOLN
0.5000 mg | Freq: Three times a day (TID) | RESPIRATORY_TRACT | Status: DC
Start: 2021-10-27 — End: 2021-10-28
  Administered 2021-10-27 – 2021-10-28 (×2): 0.5 mg via RESPIRATORY_TRACT
  Filled 2021-10-27 (×3): qty 2.5

## 2021-10-27 MED ORDER — METOPROLOL SUCCINATE ER 50 MG PO TB24
50.0000 mg | ORAL_TABLET | Freq: Every day | ORAL | Status: DC
  Administered 2021-10-27 – 2021-10-28 (×2): 50 mg via ORAL
  Filled 2021-10-27 (×2): qty 1

## 2021-10-27 MED ORDER — LORAZEPAM 2 MG/ML IJ SOLN
INTRAMUSCULAR | Status: AC
  Administered 2021-10-27: 0.5 mg via INTRAVENOUS
  Filled 2021-10-27: qty 1

## 2021-10-27 NOTE — Progress Notes (Signed)
Pt HR noted to be elevated in the 130s @ 0600, upon entering the room pt endorsed some chest tightness and trouble breathing as well as feelings of anxiety. MD notified, orders placed for 2.5mg  metoprolol IV. Pt placed back on BiPap and metoprolol administered. Pt HR now back to baseline and chest tightness resolved. Will continue to monitor.

## 2021-10-27 NOTE — Progress Notes (Signed)
   Progress Note  Patient Name: Joseph [name] Date of Encounter: 10/27/2021  Primary Cardiologist: Rollene Rotunda, MD  [name] has remained in atrial fibrillation throughout the day.  Heart rates have been reasonably well controlled however.  Follow-up chest x-ray showed stable airspace disease.  He did receive a dose of IV Lasix this morning with fairly even intake versus output.  Plan to reduce amiodarone infusion to 30 mg/h, give additional Toprol-XL 25 mg tablet this evening and then plan to further uptitrate dose tomorrow morning if tolerated.  Signed, Nona Dell, MD  10/27/2021, 3:53 PM

## 2021-10-27 NOTE — Progress Notes (Addendum)
PROGRESS NOTE  Joseph [name] [id number] DOB: 10-28-1956 DOA: 10/23/2021 PCP: [name], [name], MD   Brief History:  65 year old male with a history of COPD, CAD, diabetes, systolic congestive heart failure, admitted to the hospital with respiratory distress.  Initially required BiPAP for respiratory acidosis and hypoxia.  He tested positive for influenza A.  Started on Tamiflu/antibiotics as well as treatment for COPD exacerbation with bronchodilators and steroids.  Noted to have elevated troponin and possible some signs of pulmonary edema.  He was started on IV lasix.  Cardiology following.  Unfortunately, he continued to have bouts of afib with RVR during which he continued to have sob.  He was started on amiodarone drip and metoprolol succinate.     Assessment/Plan: Acute respiratory failure with hypoxia and hypercapnia -Likely multifactorial in the setting of influenza A infection, COPD and CHF exacerbation -Patient placed on BiPAP with improvement of acidosis -Clinically appears to be improving, will give a trial off of BiPAP and placed on nasal cannula -He is not chronically on oxygen -11/30 pm--developed RVR again>>given additional amiodarone bolus and increased amio drip to 60 mg--discussed with Dr. [name] -11/28 CT chest--CM with interstitial edema and small-mod pleural effusions;  multifocal lung opacities along pleural effusions, peripheral GGO with LL bronchial wall thickening.   Multifocal pneumonia/Influenza -Likely related to influenza -He is on Tamiflu -Procalcitonin elevated at 1.16>>5.05 -continue ceftriaxone and doxy -repeat PCT in am -MRSA screen neg   COPD with acute exacerbation -Started on intravenous steroids, bronchodilators -If continued respiratory improvement, can consider de-escalating steroids tomorrow prednisone taper -start pulmicort, xopenex, atrovent -continue IV solumedrol   Atrial Flutter/Atrial fibrillation with RVR -11/30  afternoon--rebolus amio and increase drip rate -lopressor IV given intermittently last 48 hours -metoprolol succinate increased to 50 mg   Elevated troponin -Suspect this may be demand ischemia in the setting of acute respiratory illness -11/29 Echo  EF 35-40%+WMA, mod MR, trivial TR -Appreciate cardiology input   Hemoptysis -hgb stable -V/Q scan if stable off BiPAP   Chronic systolic congestive heart failure -Last echocardiogram from 2020 shows EF of 30 to 35% -11/29 Echo  EF 35-40%+WMA, mod MR, trivial TR -Status post AICD -He is chronically on enalapril, spironolactone and metoprolol.  These have been held due to low blood pressures initially -Chronically takes torsemide, also held due to low blood pressures initially -received lasix 40 IV 11/29 and 60 mg IV 21m 11/30   AKI on CKD stage IIIa -Baseline creatinine appears 1.4-1.6 -Admission creatinine noted to be 2.01 -Possibly related to hypotension on admission -He received 1 L bolus of IV fluids in the emergency room -Continue to hold ACE inhibitors   Coronary artery disease status post CABG -Continue aspirin and Lipitor -underwent CABG in 2000 with patent grafts by cath in 2011 and patent grafts by repeat cath in 08/2020   Diabetes type 2, non-insulin-dependent -Chronically on Jardiance -Continue on sliding scale for now -Monitor blood sugars -08/30/21 A1C--5.6   Stage 1 coccyx pressure injury -present on admission -local care       Status is: Inpatient   Remains inpatient appropriate because: due to severity of illness requiring BiPAP               Family Communication:   spouse updated at bedside 12/1   Consultants:  cardiology   Code Status:  FULL    DVT Prophylaxis:  [place] Lovenox     Procedures: As Listed in Progress Note  Above   Antibiotics: Ceftriaxone 11/28>> Doxy 11/28>>         Subjective: Patient feels he is breathing a little better.  Denies f/c, cp, n/v/d.  Objective: Vitals:    10/27/21 0845 10/27/21 0856 10/27/21 0900 10/27/21 0915  BP:  103/79 120/76   Pulse: 91 (!) 120 81 95  Resp: 14  15 15   Temp:      TempSrc:      SpO2: 98%  98% 99%  Weight:      Height:        Intake/Output Summary (Last 24 hours) at 10/27/2021 1553 Last data filed at 10/27/2021 1519 Gross per 24 hour  Intake 1512.88 ml  Output 1225 ml  Net 287.88 ml   Weight change:  Exam:  General:  Pt is alert, follows commands appropriately, not in acute distress HEENT: No icterus, No thrush, No neck mass, [place]/AT Cardiovascular: IRRR, S1/S2, no rubs, no gallops Respiratory: bibasilar rales, L>R.  No wheeze Abdomen: Soft/+BS, non tender, non distended, no guarding Extremities: No edema, No lymphangitis, No petechiae, No rashes, no synovitis   Data Reviewed: I have personally reviewed following labs and imaging studies Basic Metabolic Panel: Recent Labs  Lab 10/23/2021 1828 10/11/2021 2306 10/25/21 0121 10/25/21 1945 10/26/21 0140 10/27/21 0432  NA 134*  --  137 132* 137 135  K 4.3  --  4.4 5.1 4.4 4.0  CL 103  --  106 104 108 105  CO2 17*  --  18* 14* 17* 18*  GLUCOSE 199*  --  127* 386* 129* 136*  BUN 33*  --  34* 39* 46* 51*  CREATININE 2.01*  --  1.80* 2.30* 2.07* 1.98*  CALCIUM 9.2  --  8.1* 8.1* 8.3* 8.3*  MG  --  1.9  --  2.3 2.3 2.3   Liver Function Tests: Recent Labs  Lab 10/21/2021 1828 10/25/21 1945  AST 40 56*  ALT 36 46*  ALKPHOS 121 103  BILITOT 0.7 0.4  PROT 8.3* 6.7  ALBUMIN 4.1 3.0*   No results for input(s): LIPASE, AMYLASE in the last 168 hours. No results for input(s): AMMONIA in the last 168 hours. Coagulation Profile: No results for input(s): INR, PROTIME in the last 168 hours. CBC: Recent Labs  Lab 10/19/2021 1828 10/25/21 0121 10/25/21 1945 10/27/21 0432  WBC 11.8* 12.0* 17.1* 17.1*  NEUTROABS 5.8  --   --   --   HGB 17.9* 14.2 16.2 14.1  HCT 58.7* 42.4 50.7 42.6  MCV 95.6 90.0 92.5 88.6  PLT 186 166 292 203   Cardiac Enzymes: No  results for input(s): CKTOTAL, CKMB, CKMBINDEX, TROPONINI in the last 168 hours. BNP: Invalid input(s): POCBNP CBG: Recent Labs  Lab 10/26/21 1127 10/26/21 1658 10/26/21 2114 10/27/21 0748 10/27/21 1147  GLUCAP 164* 191* 102* 123* 143*   HbA1C: No results for input(s): HGBA1C in the last 72 hours. Urine analysis:    Component Value Date/Time   COLORURINE YELLOW 03/12/2010 1655   APPEARANCEUR CLEAR 03/12/2010 1655   LABSPEC 1.010 03/12/2010 1655   PHURINE 6.0 03/12/2010 1655   GLUCOSEU NEGATIVE 03/12/2010 1655   HGBUR MODERATE (A) 03/12/2010 1655   BILIRUBINUR NEGATIVE 03/12/2010 1655   KETONESUR TRACE (A) 03/12/2010 1655   PROTEINUR 30 (A) 03/12/2010 1655   UROBILINOGEN 1.0 03/12/2010 1655   NITRITE NEGATIVE 03/12/2010 1655   LEUKOCYTESUR NEGATIVE 03/12/2010 1655   Sepsis Labs: @LABRCNTIP (procalcitonin:4,lacticidven:4) ) Recent Results (from the past 240 hour(s))  Resp Panel by RT-PCR (Flu A&B, Covid)  Nasopharyngeal Swab     Status: Abnormal   Collection Time: 10/26/2021  6:28 PM   Specimen: Nasopharyngeal Swab; Nasopharyngeal(NP) swabs in vial transport medium  Result Value Ref Range Status   SARS Coronavirus 2 by RT PCR NEGATIVE NEGATIVE Final    Comment: (NOTE) SARS-CoV-2 target nucleic acids are NOT DETECTED.  The SARS-CoV-2 RNA is generally detectable in upper respiratory specimens during the acute phase of infection. The lowest concentration of SARS-CoV-2 viral copies this assay can detect is 138 copies/mL. A negative result does not preclude SARS-Cov-2 infection and should not be used as the sole basis for treatment or other patient management decisions. A negative result may occur with  improper specimen collection/handling, submission of specimen other than nasopharyngeal swab, presence of viral mutation(s) within the areas targeted by this assay, and inadequate number of viral copies(<138 copies/mL). A negative result must be combined with clinical  observations, patient history, and epidemiological information. The expected result is Negative.  Fact Sheet for Patients:  EntrepreneurPulse.com.au  Fact Sheet for Healthcare Providers:  IncredibleEmployment.be  This test is no t yet approved or cleared by the [place] FDA and  has been authorized for detection and/or diagnosis of SARS-CoV-2 by FDA under an Emergency Use Authorization (EUA). This EUA will remain  in effect (meaning this test can be used) for the duration of the COVID-19 declaration under Section 564(b)(1) of the Act, 21 U.S.C.section 360bbb-3(b)(1), unless the authorization is terminated  or revoked sooner.       Influenza A by PCR POSITIVE (A) NEGATIVE Final   Influenza B by PCR NEGATIVE NEGATIVE Final    Comment: (NOTE) The Xpert Xpress SARS-CoV-2/FLU/RSV plus assay is intended as an aid in the diagnosis of influenza from Nasopharyngeal swab specimens and should not be used as a sole basis for treatment. Nasal washings and aspirates are unacceptable for Xpert Xpress SARS-CoV-2/FLU/RSV testing.  Fact Sheet for Patients: EntrepreneurPulse.com.au  Fact Sheet for Healthcare Providers: IncredibleEmployment.be  This test is not yet approved or cleared by the [place] FDA and has been authorized for detection and/or diagnosis of SARS-CoV-2 by FDA under an Emergency Use Authorization (EUA). This EUA will remain in effect (meaning this test can be used) for the duration of the COVID-19 declaration under Section 564(b)(1) of the Act, 21 U.S.C. section 360bbb-3(b)(1), unless the authorization is terminated or revoked.  Performed at [facility], [address] [place], [place] [address]   Culture, blood (routine x 2)     Status: None (Preliminary result)   Collection Time: 10/05/2021  7:09 PM   Specimen: Left Antecubital; Blood  Result Value Ref Range Status   Specimen Description  LEFT ANTECUBITAL  Final   Special Requests   Final    BOTTLES DRAWN AEROBIC AND ANAEROBIC Blood Culture results may not be optimal due to an inadequate volume of blood received in culture bottles   Culture   Final    NO GROWTH 3 DAYS Performed at [facility], [address], [place], [place] [address]    Report Status PENDING  Incomplete  Culture, blood (routine x 2)     Status: None (Preliminary result)   Collection Time: 10/02/2021  7:11 PM   Specimen: BLOOD RIGHT FOREARM  Result Value Ref Range Status   Specimen Description BLOOD RIGHT FOREARM  Final   Special Requests   Final    BOTTLES DRAWN AEROBIC AND ANAEROBIC Blood Culture results may not be optimal due to an inadequate volume of blood received in culture  bottles   Culture   Final    NO GROWTH 3 DAYS Performed at [facility], [address], [place], [place] [address]    Report Status PENDING  Incomplete  MRSA Next Gen by PCR, Nasal     Status: None   Collection Time: 10/25/21 12:32 AM   Specimen: Nasal Mucosa; Nasal Swab  Result Value Ref Range Status   MRSA by PCR Next Gen NOT DETECTED NOT DETECTED Final    Comment: (NOTE) The GeneXpert MRSA Assay (FDA approved for NASAL specimens only), is one component of a comprehensive MRSA colonization surveillance program. It is not intended to diagnose MRSA infection nor to guide or monitor treatment for MRSA infections. Test performance is not FDA approved in patients less than 9 years old. Performed at [facility], [address], [place], [place] [address]      Scheduled Meds:  aspirin EC  81 mg Oral QPM   atorvastatin  80 mg Oral QPM   budesonide (PULMICORT) nebulizer solution  0.5 mg Nebulization BID   Chlorhexidine Gluconate Cloth  6 each Topical Daily   enoxaparin (LOVENOX) injection  1 mg/kg Subcutaneous Q12H   insulin aspart  0-5 Units Subcutaneous QHS   insulin aspart  0-9 Units Subcutaneous TID WC   ipratropium  0.5 mg Nebulization Q8H   levalbuterol  0.63 mg  Nebulization Q8H   mouth rinse  15 mL Mouth Rinse BID   methylPREDNISolone (SOLU-MEDROL) injection  60 mg Intravenous Daily   metoprolol succinate  25 mg Oral Once   metoprolol succinate  50 mg Oral Daily   oseltamivir  30 mg Oral BID   sodium chloride flush  10-40 mL Intracatheter Q12H   Continuous Infusions:  amiodarone 60 mg/hr (10/27/21 1519)   cefTRIAXone (ROCEPHIN)  IV Stopped (10/26/21 1833)   doxycycline (VIBRAMYCIN) IV Stopped (10/27/21 0810)   promethazine (PHENERGAN) injection (IM or IVPB)      Procedures/Studies: CT CHEST WO CONTRAST  Result Date: 10/18/2021 CLINICAL DATA:  Respiratory failure with hypoxia, hypercapnia, influenza infection and elevated creatinine. EXAM: CT CHEST WITHOUT CONTRAST TECHNIQUE: Multidetector CT imaging of the chest was performed following the standard protocol without IV contrast. COMPARISON:  Portable chest today, portable chest 08/23/2021, and CTA chest 03/12/2010. FINDINGS: Cardiovascular: Mild cardiomegaly with left chamber predominance is again noted with pacemaker wiring in the heart and left chest battery again causing streak artifact. Native coronary arteries are heavily calcified with again noted old CABG and healed sternotomy changes. There is no pericardial effusion. There is mild distention of the superior pulmonary veins compared to the prior CTA, and interval increased slight prominence of the pulmonary trunk measuring 3.1 cm suggesting arterial hypertension. There is patchy calcification in the aorta and great vessels but no aortic aneurysm. Mediastinum/Nodes: No enlarged mediastinal or axillary lymph nodes. Thyroid gland, trachea, and esophagus demonstrate no significant findings. There is a small to moderate hiatal hernia again noted however, which contains mostly fat. Lungs/Pleura: There are patchy ground-glass opacities in the upper lobes which predominate in the peripheral lungs, nonlocalizing faint ground-glass opacities in the  infrahilar lower lobes, and increased opacity in the posterior lower lobes alongside small to moderate-sized pleural effusions which could be atelectasis or consolidation. There is interstitial prominence in the lung apices and bases and in the lower zonal parahilar regions, with a distribution most suggestive of interstitial edema. The central airways show no focal filling defects but do show bronchial thickening in the lower lobes. Upper Abdomen: No acute abnormality.  Old  cholecystectomy. Musculoskeletal: No chest wall mass or suspicious bone lesions identified. IMPRESSION: 1. Cardiomegaly with interstitial edema and small to moderate symmetric layering pleural effusions consistent with CHF or fluid overload. 2. Multifocal lung opacities. Opacities alongside the pleural effusions could be atelectasis or consolidation. Peripheral-predominant ground-glass opacities in the upper lobes are probably due to pneumonitis including viral etiologies. Ground-glass central opacities are likely ground-glass edema and there is bronchial thickening in both lower lobes. 3. Calcific CAD with remote CABG and pacing system. 4. Slightly prominent pulmonary trunk but no right heart chamber enlargement. 5. Follow-up study recommended after treatment to ensure clearing of opacities. Electronically Signed   By: Telford Nab M.D.   On: 09/30/2021 23:00   DG Chest Port 1 View  Result Date: 10/27/2021 CLINICAL DATA:  COPD, pneumonia EXAM: PORTABLE CHEST 1 VIEW COMPARISON:  Chest radiograph dated October 25, 2021. FINDINGS: The heart is normal in size. Evidence of prior coronary artery bypass grafting. Left subclavian access AICD with distal tip in the right ventricle. There are bilateral lower lobe hazy opacities concerning for pleural effusion/infiltrate, unchanged. No appreciable pneumothorax. IMPRESSION: 1. Bilateral lower lobe hazy opacities concerning for airspace disease and/or pleural effusions, not significantly changed. 2.  Cardiomediastinal silhouette is within normal limits with evidence of prior coronary artery bypass grafting. Electronically Signed   By: Keane Police D.O.   On: 10/27/2021 13:26   DG CHEST PORT 1 VIEW  Result Date: 10/25/2021 CLINICAL DATA:  Shortness of breath.  History of COPD. EXAM: PORTABLE CHEST 1 VIEW COMPARISON:  Chest x-ray 10/08/2021.  CT chest 10/26/2021. FINDINGS: There are increasing patchy airspace opacities in the lower lungs bilaterally. Patchy slightly nodular opacities in the mid lungs persists, unchanged from prior CT. Small pleural effusions are present. There is no pneumothorax. The cardiomediastinal silhouette is stable, the heart is enlarged. Patient is status post cardiac surgery. Left-sided pacemaker is present. There are no acute fractures. IMPRESSION: 1. Increasing bilateral airspace disease in the lower lobes. 2. Stable small pleural effusions. Electronically Signed   By: Ronney Asters M.D.   On: 10/25/2021 19:27   DG Chest Portable 1 View  Result Date: 10/13/2021 CLINICAL DATA:  Respiratory distress. EXAM: PORTABLE CHEST 1 VIEW COMPARISON:  Chest x-rays dated 08/23/2021 and 05/03/2021. FINDINGS: Mildly increased interstitial markings bilaterally compared to the earlier exams, suggesting acute interstitial edema/thickening superimposed on chronic interstitial lung disease/fibrosis. No confluent opacity to suggest a consolidating pneumonia. No pleural effusion or pneumothorax is seen. Heart size and mediastinal contours are stable. LEFT chest wall pacemaker/ICD apparatus appears stable in position. Osseous structures about the chest are unremarkable. IMPRESSION: New interstitial prominence bilaterally indicating mild volume overload versus multifocal atypical pneumonia, superimposed on chronic interstitial lung disease/fibrosis. Electronically Signed   By: Franki Cabot M.D.   On: 10/01/2021 19:08   ECHOCARDIOGRAM COMPLETE  Result Date: 10/25/2021    ECHOCARDIOGRAM REPORT    Patient Name:   LINKIN GERGES Date of Exam: 10/25/2021 Medical Rec #:  [id number]     Height:       71.0 in Accession #:    [id number]    Weight:       177.5 lb Date of Birth:  1956/08/17      BSA:          2.004 m Patient Age:    59 years      BP:           103/54 mmHg Patient Gender: M  HR:           69 bpm. Exam Location:  Inpatient Procedure: 2D Echo, 3D Echo, Cardiac Doppler and Color Doppler Indications:    R07.9* Chest pain, unspecified. Elevated troponin.  History:        Patient has prior history of Echocardiogram examinations, most                 recent 01/28/2019. Cardiomyopathy and CHF, CAD, Prior CABG,                 Defibrillator and Abnormal ECG, COPD; Risk Factors:Hypertension.                 Patient is flu positive.  Sonographer:    Sheralyn Boatman RDCS Referring Phys: [id number] Heloise [facility]ch[facility]  Sonographer Comments: Technically difficult study due to poor echo windows. Pateient coughing throughout test. IMPRESSIONS  1. Left ventricular ejection fraction, by estimation, is 35 to 40%. The left ventricle has moderately decreased function. The left ventricle demonstrates regional wall motion abnormalities (see scoring diagram/findings for description). The mid-to-apical anteroseptal LV wall segments appear akinetic. The rest of the LV segments appear mildly-to-moderately hypokinetic. The left ventricular internal cavity size was mildly dilated. Indeterminate diastolic filling due to E-A fusion.  2. Right ventricular systolic function is normal. The right ventricular size is normal. Tricuspid regurgitation signal is inadequate for assessing PA pressure.  3. 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 The aortic valve is tricuspid. There is mild calcification of the aortic valve. There is mild thickening of the aortic valve. Aortic valve regurgitation is not visualized.  5. The inferior vena cava is dilated in size  with >50% respiratory variability, suggesting right atrial pressure of 8 mmHg.  6. Left atrial size was severely dilated.  7. Right atrial size was moderately dilated. Comparison(s): Compared to prior TTE in 01/2019, the LVEF appears mildly improved to 35-40% (previously 30-35%). The mitral regurgitation now appears moderate (previously mild). FINDINGS  Left Ventricle: Left ventricular ejection fraction, by estimation, is 35 to 40%. The left ventricle has moderately decreased function. The left ventricle demonstrates regional wall motion abnormalities. The mid-to-apical anteroseptal LV wall segments appear akinetic. The rest of the LV segments are mildly-to-moderately hypokinetic. 3D left ventricular ejection fraction analysis performed but not reported based on interpreter judgement due to suboptimal tracking. The left ventricular internal cavity size was mildly dilated. There is no left ventricular hypertrophy. Indeterminate diastolic filling due to E-A fusion. Right Ventricle: The right ventricular size is normal. No increase in right ventricular wall thickness. Right ventricular systolic function is normal. Tricuspid regurgitation signal is inadequate for assessing PA pressure. Left Atrium: Left atrial size was severely dilated. Right Atrium: Right atrial size was moderately dilated. Pericardium: There is no evidence of pericardial effusion. Mitral Valve: The mitral valve is normal in structure. There is mild thickening of the mitral valve leaflet(s). There is mild calcification of the mitral valve leaflet(s). Mild mitral annular calcification. There is tethering of the posterior mitral valve leafltet with resultant moderate, posteriorly directed mitral regurgitation. MV peak gradient, 10.8 mmHg. The mean mitral valve gradient is 3.0 mmHg. Tricuspid Valve: The tricuspid valve is normal in structure. Tricuspid valve regurgitation is trivial. Aortic Valve: The aortic valve is tricuspid. There is mild calcification  of the aortic valve. There is mild thickening of the aortic valve. Aortic valve regurgitation is not visualized. Pulmonic Valve: The pulmonic valve was normal in  structure. Pulmonic valve regurgitation is trivial. Aorta: The aortic root and ascending aorta are structurally normal, with no evidence of dilitation. Venous: The inferior vena cava is dilated in size with greater than 50% respiratory variability, suggesting right atrial pressure of 8 mmHg. IAS/Shunts: No atrial level shunt detected by color flow Doppler. Additional Comments: A device lead is visualized.  LEFT VENTRICLE PLAX 2D LVIDd:         3.31 cm      Diastology LVIDs:         2.91 cm      LV e' medial:    7.62 cm/s LV PW:         3.62 cm      LV E/e' medial:  17.1 LV IVS:        0.66 cm      LV e' lateral:   12.40 cm/s LVOT diam:     1.90 cm      LV E/e' lateral: 10.5 LV SV:         46 LV SV Index:   23 LVOT Area:     2.84 cm                              3D Volume EF: LV Volumes (MOD)            3D EF:        35 % LV vol d, MOD A2C: 132.0 ml LV EDV:       207 ml LV vol d, MOD A4C: 133.0 ml LV ESV:       135 ml LV vol s, MOD A2C: 69.6 ml  LV SV:        71 ml LV vol s, MOD A4C: 85.8 ml LV SV MOD A2C:     62.4 ml LV SV MOD A4C:     133.0 ml LV SV MOD BP:      61.6 ml RIGHT VENTRICLE             IVC RV S prime:     11.30 cm/s  IVC diam: 2.20 cm TAPSE (M-mode): 1.1 cm LEFT ATRIUM             Index        RIGHT ATRIUM           Index LA diam:        3.60 cm 1.80 cm/m   RA Area:     12.50 cm LA Vol (A2C):   57.4 ml 28.64 ml/m  RA Volume:   25.30 ml  12.62 ml/m LA Vol (A4C):   54.4 ml 27.14 ml/m LA Biplane Vol: 57.4 ml 28.64 ml/m  AORTIC VALVE LVOT Vmax:   120.00 cm/s LVOT Vmean:  78.400 cm/s LVOT VTI:    0.162 m  AORTA Ao Root diam: 3.30 cm Ao Asc diam:  3.30 cm MITRAL VALVE MV Area (PHT): 2.83 cm       SHUNTS MV Area VTI:   1.71 cm       Systemic VTI:  0.16 m MV Peak grad:  10.8 mmHg      Systemic Diam: 1.90 cm MV Mean grad:  3.0 mmHg MV Vmax:        1.64 m/s MV Vmean:      75.0 cm/s MV Decel Time: 268 msec MR Peak grad:    58.7 mmHg MR Mean grad:    33.0 mmHg MR Vmax:  383.00 cm/s MR Vmean:        269.0 cm/s MR PISA:         1.57 cm MR PISA Eff ROA: 16 mm MR PISA Radius:  0.50 cm MV E velocity: 130.00 cm/s Gwyndolyn Kaufman MD Electronically signed by Gwyndolyn Kaufman MD Signature Date/Time: 10/25/2021/9:14:44 PM    Final    CUP PACEART REMOTE DEVICE CHECK  Result Date: 10/06/2021 Scheduled remote reviewed. Normal device function.  Battery estimated 6.34mo Next remote 12/12 LR  [place] EKG SITE RITE  Result Date: 10/26/2021 If Site Rite image not attached, placement could not be confirmed due to current cardiac rhythm.   Orson Eva, DO  [facility]  If 7PM-7AM, please contact night-coverage [email or web address] Password [email or web address] 10/27/2021, 3:53 PM   LOS: 3 days

## 2021-10-27 NOTE — Progress Notes (Signed)
**Note De-identified Joseph [name] Obfuscation** EKG complete and placed in patient chart 

## 2021-10-27 NOTE — Plan of Care (Signed)

## 2021-10-27 NOTE — Progress Notes (Signed)
**Note De-identified Joseph [name] Obfuscation** Patient removed from BIPAP and placed on 4 L [place]; tolerating well.  RRT to continue to monitor 

## 2021-10-27 NOTE — Progress Notes (Signed)
Progress Note  Patient Name: Joseph [name] Date of Encounter: 10/27/2021  Primary Cardiologist: Rollene Rotunda, MD  Subjective   On BIPAP this morning.  Experienced shortness of breath and some chest discomfort with elevation in heart rate earlier a.m.  Still with intermittent cough.  Inpatient Medications    Scheduled Meds:  aspirin EC  81 mg Oral QPM   atorvastatin  80 mg Oral QPM   budesonide (PULMICORT) nebulizer solution  0.5 mg Nebulization BID   Chlorhexidine Gluconate Cloth  6 each Topical Daily   enoxaparin (LOVENOX) injection  1 mg/kg Subcutaneous Q12H   furosemide  40 mg Intravenous Once   insulin aspart  0-5 Units Subcutaneous QHS   insulin aspart  0-9 Units Subcutaneous TID WC   levalbuterol  0.63 mg Nebulization Q8H   mouth rinse  15 mL Mouth Rinse BID   methylPREDNISolone (SOLU-MEDROL) injection  60 mg Intravenous Daily   metoprolol succinate  50 mg Oral Daily   oseltamivir  30 mg Oral BID   sodium chloride flush  10-40 mL Intracatheter Q12H   Continuous Infusions:  amiodarone 60 mg/hr (10/27/21 0558)   cefTRIAXone (ROCEPHIN)  IV Stopped (10/26/21 1833)   doxycycline (VIBRAMYCIN) IV 100 mg (10/27/21 0609)   promethazine (PHENERGAN) injection (IM or IVPB)     PRN Meds: acetaminophen **OR** acetaminophen, albuterol, levalbuterol, polyethylene glycol, promethazine (PHENERGAN) injection (IM or IVPB), sodium chloride flush   Vital Signs    Vitals:   10/27/21 0400 10/27/21 0500 10/27/21 0518 10/27/21 0600  BP: (!) 134/91 (!) 129/100  120/87  Pulse: 85 (!) 105 84 (!) 128  Resp: 15 16 12 18   Temp:   97.6 F (36.4 C)   TempSrc:   Oral   SpO2: 99% 97% 99% 95%  Weight:      Height:        Intake/Output Summary (Last 24 hours) at 10/27/2021 0813 Last data filed at 10/27/2021 0558 Gross per 24 hour  Intake 1407.1 ml  Output 1350 ml  Net 57.1 ml   Filed Weights   10/05/2021 1935 10/25/21 0043  Weight: 80.3 kg 80.5 kg    Telemetry    Atrial  fibrillation.  Personally reviewed.  ECG    No ECG reviewed.  Physical Exam   GEN: On BIPAP.   Neck: No JVD. Cardiac: Irregularly irregular, no gallop.  Respiratory: Decreased breath sounds at the bases, scattered rhonchi and crackles. GI: Soft, nontender, bowel sounds present. MS: Trace ankle edema; No deformity. Neuro:  Nonfocal. Psych: Alert and oriented x 3. Normal affect.  Labs    Chemistry Recent Labs  Lab 10/09/2021 1828 10/25/21 0121 10/25/21 1945 10/26/21 0140 10/27/21 0432  NA 134*   < > 132* 137 135  K 4.3   < > 5.1 4.4 4.0  CL 103   < > 104 108 105  CO2 17*   < > 14* 17* 18*  GLUCOSE 199*   < > 386* 129* 136*  BUN 33*   < > 39* 46* 51*  CREATININE 2.01*   < > 2.30* 2.07* 1.98*  CALCIUM 9.2   < > 8.1* 8.3* 8.3*  PROT 8.3*  --  6.7  --   --   ALBUMIN 4.1  --  3.0*  --   --   AST 40  --  56*  --   --   ALT 36  --  46*  --   --   ALKPHOS 121  --  103  --   --  BILITOT 0.7  --  0.4  --   --   GFRNONAA 36*   < > 30* 34* 36*  ANIONGAP 14   < > 14 12 12    < > = values in this interval not displayed.     Hematology Recent Labs  Lab 10/25/21 0121 10/25/21 1945 10/27/21 0432  WBC 12.0* 17.1* 17.1*  RBC 4.71 5.48 4.81  HGB 14.2 16.2 14.1  HCT 42.4 50.7 42.6  MCV 90.0 92.5 88.6  MCH 30.1 29.6 29.3  MCHC 33.5 32.0 33.1  RDW 13.3 13.3 13.5  PLT 166 292 203    Cardiac Enzymes Recent Labs  Lab 10/25/21 0121 10/25/21 0708 10/25/21 1945 10/26/21 0140 10/26/21 0655  TROPONINIHS 466* 365* 281* 396* 305*    BNP Recent Labs  Lab 10/25/2021 1909 10/25/21 1945  BNP 243.0* 383.0*     DDimer Recent Labs  Lab 10/25/21 1945  DDIMER 2.30*     Radiology    DG CHEST PORT 1 VIEW  Result Date: 10/25/2021 CLINICAL DATA:  Shortness of breath.  History of COPD. EXAM: PORTABLE CHEST 1 VIEW COMPARISON:  Chest x-ray 10/23/2021.  CT chest 10/22/2021. FINDINGS: There are increasing patchy airspace opacities in the lower lungs bilaterally. Patchy slightly  nodular opacities in the mid lungs persists, unchanged from prior CT. Small pleural effusions are present. There is no pneumothorax. The cardiomediastinal silhouette is stable, the heart is enlarged. Patient is status post cardiac surgery. Left-sided pacemaker is present. There are no acute fractures. IMPRESSION: 1. Increasing bilateral airspace disease in the lower lobes. 2. Stable small pleural effusions. Electronically Signed   By: Ronney Asters M.D.   On: 10/25/2021 19:27   ECHOCARDIOGRAM COMPLETE  Result Date: 10/25/2021    ECHOCARDIOGRAM REPORT   Patient Name:   Joseph [name] Date of Exam: 10/25/2021 Medical Rec #:  [id number]     Height:       71.0 in Accession #:    [id number]    Weight:       177.5 lb Date of Birth:  06-01-1956      BSA:          2.004 m Patient Age:    65 years      BP:           103/54 mmHg Patient Gender: M             HR:           69 bpm. Exam Location:  Inpatient Procedure: 2D Echo, 3D Echo, Cardiac Doppler and Color Doppler Indications:    R07.9* Chest pain, unspecified. Elevated troponin.  History:        Patient has prior history of Echocardiogram examinations, most                 recent 01/28/2019. Cardiomyopathy and CHF, CAD, Prior CABG,                 Defibrillator and Abnormal ECG, COPD; Risk Factors:Hypertension.                 Patient is flu positive.  Sonographer:    Roseanna Rainbow RDCS Referring Phys: [id number] Leanne Chang [facility]  Sonographer Comments: Technically difficult study due to poor echo windows. Pateient coughing throughout test. IMPRESSIONS  1. Left ventricular ejection fraction, by estimation, is 35 to 40%. The left ventricle has moderately decreased function. The left ventricle demonstrates regional wall motion abnormalities (see scoring diagram/findings for description). The mid-to-apical anteroseptal LV wall  segments appear akinetic. The rest of the LV segments appear mildly-to-moderately hypokinetic. The left ventricular internal cavity size was mildly dilated.  Indeterminate diastolic filling due to E-A fusion.  2. Right ventricular systolic function is normal. The right ventricular size is normal. Tricuspid regurgitation signal is inadequate for assessing PA pressure.  3. 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 The aortic valve is tricuspid. There is mild calcification of the aortic valve. There is mild thickening of the aortic valve. Aortic valve regurgitation is not visualized.  5. The inferior vena cava is dilated in size with >50% respiratory variability, suggesting right atrial pressure of 8 mmHg.  6. Left atrial size was severely dilated.  7. Right atrial size was moderately dilated. Comparison(s): Compared to prior TTE in 01/2019, the LVEF appears mildly improved to 35-40% (previously 30-35%). The mitral regurgitation now appears moderate (previously mild). FINDINGS  Left Ventricle: Left ventricular ejection fraction, by estimation, is 35 to 40%. The left ventricle has moderately decreased function. The left ventricle demonstrates regional wall motion abnormalities. The mid-to-apical anteroseptal LV wall segments appear akinetic. The rest of the LV segments are mildly-to-moderately hypokinetic. 3D left ventricular ejection fraction analysis performed but not reported based on interpreter judgement due to suboptimal tracking. The left ventricular internal cavity size was mildly dilated. There is no left ventricular hypertrophy. Indeterminate diastolic filling due to E-A fusion. Right Ventricle: The right ventricular size is normal. No increase in right ventricular wall thickness. Right ventricular systolic function is normal. Tricuspid regurgitation signal is inadequate for assessing PA pressure. Left Atrium: Left atrial size was severely dilated. Right Atrium: Right atrial size was moderately dilated. Pericardium: There is no evidence of pericardial effusion. Mitral Valve: The  mitral valve is normal in structure. There is mild thickening of the mitral valve leaflet(s). There is mild calcification of the mitral valve leaflet(s). Mild mitral annular calcification. There is tethering of the posterior mitral valve leafltet with resultant moderate, posteriorly directed mitral regurgitation. MV peak gradient, 10.8 mmHg. The mean mitral valve gradient is 3.0 mmHg. Tricuspid Valve: The tricuspid valve is normal in structure. Tricuspid valve regurgitation is trivial. Aortic Valve: The aortic valve is tricuspid. There is mild calcification of the aortic valve. There is mild thickening of the aortic valve. Aortic valve regurgitation is not visualized. Pulmonic Valve: The pulmonic valve was normal in structure. Pulmonic valve regurgitation is trivial. Aorta: The aortic root and ascending aorta are structurally normal, with no evidence of dilitation. Venous: The inferior vena cava is dilated in size with greater than 50% respiratory variability, suggesting right atrial pressure of 8 mmHg. IAS/Shunts: No atrial level shunt detected by color flow Doppler. Additional Comments: A device lead is visualized.  LEFT VENTRICLE PLAX 2D LVIDd:         3.31 cm      Diastology LVIDs:         2.91 cm      LV e' medial:    7.62 cm/s LV PW:         3.62 cm      LV E/e' medial:  17.1 LV IVS:        0.66 cm      LV e' lateral:   12.40 cm/s LVOT diam:     1.90 cm      LV E/e' lateral: 10.5 LV SV:         46 LV SV Index:   23 LVOT Area:  2.84 cm                              3D Volume EF: LV Volumes (MOD)            3D EF:        35 % LV vol d, MOD A2C: 132.0 ml LV EDV:       207 ml LV vol d, MOD A4C: 133.0 ml LV ESV:       135 ml LV vol s, MOD A2C: 69.6 ml  LV SV:        71 ml LV vol s, MOD A4C: 85.8 ml LV SV MOD A2C:     62.4 ml LV SV MOD A4C:     133.0 ml LV SV MOD BP:      61.6 ml RIGHT VENTRICLE             IVC RV S prime:     11.30 cm/s  IVC diam: 2.20 cm TAPSE (M-mode): 1.1 cm LEFT ATRIUM             Index         RIGHT ATRIUM           Index LA diam:        3.60 cm 1.80 cm/m   RA Area:     12.50 cm LA Vol (A2C):   57.4 ml 28.64 ml/m  RA Volume:   25.30 ml  12.62 ml/m LA Vol (A4C):   54.4 ml 27.14 ml/m LA Biplane Vol: 57.4 ml 28.64 ml/m  AORTIC VALVE LVOT Vmax:   120.00 cm/s LVOT Vmean:  78.400 cm/s LVOT VTI:    0.162 m  AORTA Ao Root diam: 3.30 cm Ao Asc diam:  3.30 cm MITRAL VALVE MV Area (PHT): 2.83 cm       SHUNTS MV Area VTI:   1.71 cm       Systemic VTI:  0.16 m MV Peak grad:  10.8 mmHg      Systemic Diam: 1.90 cm MV Mean grad:  3.0 mmHg MV Vmax:       1.64 m/s MV Vmean:      75.0 cm/s MV Decel Time: 268 msec MR Peak grad:    58.7 mmHg MR Mean grad:    33.0 mmHg MR Vmax:         383.00 cm/s MR Vmean:        269.0 cm/s MR PISA:         1.57 cm MR PISA Eff ROA: 16 mm MR PISA Radius:  0.50 cm MV E velocity: 130.00 cm/s Gwyndolyn Kaufman MD Electronically signed by Gwyndolyn Kaufman MD Signature Date/Time: 10/25/2021/9:14:44 PM    Final    [place] EKG SITE RITE  Result Date: 10/26/2021 If Site Rite image not attached, placement could not be confirmed due to current cardiac rhythm.   Assessment & Plan    1.  Persistent atrial fibrillation/flutter with RVR.  CHA2DS2-VASc score is 4.  He is on full dose Lovenox at this time.  He remains on IV amiodarone load, has received intermittent IV Lopressor doses, and started back on Toprol-XL yesterday.  2.  Influenza A and multifocal pneumonia with acute hypoxic respiratory failure.  Presently on ceftriaxone, doxycycline, and Tamiflu.  Also on Solu-Medrol and Xopenex/Pulmicort nebulizer treatments.  Procalcitonin is 5.05.  Leukocytosis likely secondary to steroids.  Also has elevated D-dimer 2.3, but at this point do not suspect  high probability of pulmonary embolus in light of present clinical scenario.  Present creatinine precludes chest CTA.  Blood cultures are negative.  Continues to have productive cough, some of which is blood-tinged.  3.  HFrEF with ischemic  cardiomyopathy and LVEF 35 to 40% by follow-up echocardiogram this admission.  Normal RV contraction.  GDMT has been interrupted due to recent hemodynamics and acute kidney injury.  Recent BNP 383.  Elevated high-sensitivity troponin I levels have been flat and consistent with myocardial strain rather than ACS.  Currently on aspirin, Toprol-XL, and Lipitor.  4.  CAD status post CABG in 2000 with patent bypass grafts in October 2021.  5.  Moderate mitral regurgitation by recent echocardiogram.  6.  Acute kidney injury, creatinine down to 1.98.  Reviewed with nursing.  Plan to increase Toprol-XL to 50 mg daily this morning, otherwise continue IV amiodarone load at current dose, check ECG.  Will give dose of Lasix 40 mg IV and follow response, he was approximately 500 cc out more than in the last 24 hours.  Check portable chest x-ray.  Continue antibiotics and supportive management per primary team.  Would ideally like to hold off on electrical cardioversion, not certain that he would tolerate sedation very well with present respiratory status.  Signed, Rozann Lesches, MD  10/27/2021, 8:13 AM

## 2021-10-27 DEATH — deceased

## 2021-10-28 ENCOUNTER — Inpatient Hospital Stay (HOSPITAL_COMMUNITY): Payer: Medicare HMO | Admitting: Anesthesiology

## 2021-10-28 ENCOUNTER — Inpatient Hospital Stay (HOSPITAL_COMMUNITY): Payer: Medicare HMO

## 2021-10-28 ENCOUNTER — Encounter (HOSPITAL_COMMUNITY): Payer: Self-pay | Admitting: Internal Medicine

## 2021-10-28 DIAGNOSIS — I5022 Chronic systolic (congestive) heart failure: Secondary | ICD-10-CM | POA: Diagnosis not present

## 2021-10-28 DIAGNOSIS — I255 Ischemic cardiomyopathy: Secondary | ICD-10-CM | POA: Diagnosis not present

## 2021-10-28 DIAGNOSIS — N179 Acute kidney failure, unspecified: Secondary | ICD-10-CM | POA: Diagnosis not present

## 2021-10-28 DIAGNOSIS — I4891 Unspecified atrial fibrillation: Secondary | ICD-10-CM | POA: Diagnosis not present

## 2021-10-28 DIAGNOSIS — J9601 Acute respiratory failure with hypoxia: Secondary | ICD-10-CM | POA: Diagnosis not present

## 2021-10-28 DIAGNOSIS — J441 Chronic obstructive pulmonary disease with (acute) exacerbation: Secondary | ICD-10-CM | POA: Diagnosis not present

## 2021-10-28 LAB — BLOOD GAS, ARTERIAL
Acid-base deficit: 8.2 mmol/L — ABNORMAL HIGH (ref 0.0–2.0)
Bicarbonate: 17.5 mmol/L — ABNORMAL LOW (ref 20.0–28.0)
Drawn by: 22179
FIO2: 100
O2 Saturation: 94.2 %
Patient temperature: 35.3
pCO2 arterial: 36.6 mmHg (ref 32.0–48.0)
pH, Arterial: 7.288 — ABNORMAL LOW (ref 7.350–7.450)
pO2, Arterial: 85.2 mmHg (ref 83.0–108.0)

## 2021-10-28 LAB — BASIC METABOLIC PANEL
Anion gap: 14 (ref 5–15)
BUN: 55 mg/dL — ABNORMAL HIGH (ref 8–23)
CO2: 15 mmol/L — ABNORMAL LOW (ref 22–32)
Calcium: 8.2 mg/dL — ABNORMAL LOW (ref 8.9–10.3)
Chloride: 105 mmol/L (ref 98–111)
Creatinine, Ser: 2.6 mg/dL — ABNORMAL HIGH (ref 0.61–1.24)
GFR, Estimated: 26 mL/min — ABNORMAL LOW (ref >60)
Glucose, Bld: 193 mg/dL — ABNORMAL HIGH (ref 70–99)
Potassium: 4.6 mmol/L (ref 3.5–5.1)
Sodium: 134 mmol/L — ABNORMAL LOW (ref 135–145)

## 2021-10-28 LAB — POCT I-STAT 7, (LYTES, BLD GAS, ICA,H+H)
Acid-base deficit: 5 mmol/L — ABNORMAL HIGH (ref 0.0–2.0)
Bicarbonate: 19.3 mmol/L — ABNORMAL LOW (ref 20.0–28.0)
Calcium, Ion: 1.13 mmol/L — ABNORMAL LOW (ref 1.15–1.40)
HCT: 39 % (ref 39.0–52.0)
Hemoglobin: 13.3 g/dL (ref 13.0–17.0)
O2 Saturation: 100 %
Potassium: 4.7 mmol/L (ref 3.5–5.1)
Sodium: 131 mmol/L — ABNORMAL LOW (ref 135–145)
TCO2: 20 mmol/L — ABNORMAL LOW (ref 22–32)
pCO2 arterial: 34.7 mmHg (ref 32.0–48.0)
pH, Arterial: 7.354 (ref 7.350–7.450)
pO2, Arterial: 200 mmHg — ABNORMAL HIGH (ref 83.0–108.0)

## 2021-10-28 LAB — GLUCOSE, CAPILLARY
Glucose-Capillary: 146 mg/dL — ABNORMAL HIGH (ref 70–99)
Glucose-Capillary: 292 mg/dL — ABNORMAL HIGH (ref 70–99)
Glucose-Capillary: 179 mg/dL — ABNORMAL HIGH (ref 70–99)
Glucose-Capillary: 170 mg/dL — ABNORMAL HIGH (ref 70–99)
Glucose-Capillary: 134 mg/dL — ABNORMAL HIGH (ref 70–99)

## 2021-10-28 LAB — MAGNESIUM: Magnesium: 2.5 mg/dL — ABNORMAL HIGH (ref 1.7–2.4)

## 2021-10-28 LAB — TROPONIN I (HIGH SENSITIVITY): Troponin I (High Sensitivity): 118 ng/L (ref <18)

## 2021-10-28 LAB — CBC
HCT: 48.1 % (ref 39.0–52.0)
Hemoglobin: 15.7 g/dL (ref 13.0–17.0)
MCH: 29.8 pg (ref 26.0–34.0)
MCHC: 32.6 g/dL (ref 30.0–36.0)
MCV: 91.3 fL (ref 80.0–100.0)
Platelets: 297 10*3/uL (ref 150–400)
RBC: 5.27 MIL/uL (ref 4.22–5.81)
RDW: 13.7 % (ref 11.5–15.5)
WBC: 28.4 10*3/uL — ABNORMAL HIGH (ref 4.0–10.5)
nRBC: 0 % (ref 0.0–0.2)

## 2021-10-28 LAB — BRAIN NATRIURETIC PEPTIDE: B Natriuretic Peptide: 377.2 pg/mL — ABNORMAL HIGH (ref 0.0–100.0)

## 2021-10-28 LAB — PHOSPHORUS: Phosphorus: 5.9 mg/dL — ABNORMAL HIGH (ref 2.5–4.6)

## 2021-10-28 MED ORDER — FENTANYL CITRATE PF 50 MCG/ML IJ SOSY
PREFILLED_SYRINGE | INTRAMUSCULAR | Status: AC
  Filled 2021-10-28: qty 2

## 2021-10-28 MED ORDER — ENOXAPARIN SODIUM 80 MG/0.8ML IJ SOSY
1.0000 mg/kg | PREFILLED_SYRINGE | INTRAMUSCULAR | Status: DC
  Administered 2021-10-28: 80 mg via SUBCUTANEOUS
  Filled 2021-10-28: qty 0.8

## 2021-10-28 MED ORDER — ETOMIDATE 2 MG/ML IV SOLN
INTRAVENOUS | Status: AC
  Filled 2021-10-28: qty 20

## 2021-10-28 MED ORDER — FENTANYL 2500MCG IN NS 250ML (10MCG/ML) PREMIX INFUSION
25.0000 ug/h | INTRAVENOUS | Status: DC
  Administered 2021-10-28: 50 ug/h via INTRAVENOUS
  Filled 2021-10-28 (×2): qty 250

## 2021-10-28 MED ORDER — PROSOURCE TF PO LIQD
45.0000 mL | Freq: Two times a day (BID) | ORAL | Status: DC
  Administered 2021-10-28: 45 mL
  Filled 2021-10-28: qty 45

## 2021-10-28 MED ORDER — FENTANYL CITRATE (PF) 100 MCG/2ML IJ SOLN: 25.0000 ug | INTRAMUSCULAR | Status: DC | PRN

## 2021-10-28 MED ORDER — FENTANYL CITRATE (PF) 100 MCG/2ML IJ SOLN: 25.0000 ug | Freq: Once | INTRAMUSCULAR | Status: DC

## 2021-10-28 MED ORDER — CHLORHEXIDINE GLUCONATE 0.12% ORAL RINSE (MEDLINE KIT)
15.0000 mL | Freq: Two times a day (BID) | OROMUCOSAL | Status: DC
  Administered 2021-10-28 – 2021-11-14 (×35): 15 mL via OROMUCOSAL

## 2021-10-28 MED ORDER — ROCURONIUM BROMIDE 10 MG/ML (PF) SYRINGE
PREFILLED_SYRINGE | INTRAVENOUS | Status: AC
  Filled 2021-10-28: qty 10

## 2021-10-28 MED ORDER — PROPOFOL 1000 MG/100ML IV EMUL
INTRAVENOUS | Status: AC
  Administered 2021-10-28: 200 mg
  Filled 2021-10-28: qty 100

## 2021-10-28 MED ORDER — PROPOFOL 1000 MG/100ML IV EMUL: 0.0000 ug/kg/min | INTRAVENOUS | Status: DC

## 2021-10-28 MED ORDER — MIDAZOLAM-SODIUM CHLORIDE 100-0.9 MG/100ML-% IV SOLN
0.5000 mg/h | INTRAVENOUS | Status: DC
  Administered 2021-10-28: 1 mg/h via INTRAVENOUS

## 2021-10-28 MED ORDER — FENTANYL CITRATE (PF) 100 MCG/2ML IJ SOLN
INTRAMUSCULAR | Status: AC
  Filled 2021-10-28: qty 2

## 2021-10-28 MED ORDER — SODIUM CHLORIDE 0.9 % IV SOLN
2.0000 g | Freq: Once | INTRAVENOUS | Status: AC
  Administered 2021-10-28: 2 g via INTRAVENOUS
  Filled 2021-10-28: qty 2

## 2021-10-28 MED ORDER — ARFORMOTEROL TARTRATE 15 MCG/2ML IN NEBU
15.0000 ug | INHALATION_SOLUTION | Freq: Two times a day (BID) | RESPIRATORY_TRACT | Status: DC
Start: 2021-10-28 — End: 2021-11-14
  Administered 2021-10-28 – 2021-11-14 (×34): 15 ug via RESPIRATORY_TRACT
  Filled 2021-10-28 (×33): qty 2

## 2021-10-28 MED ORDER — DOCUSATE SODIUM 50 MG/5ML PO LIQD
100.0000 mg | Freq: Two times a day (BID) | ORAL | Status: DC
  Administered 2021-10-28 – 2021-10-30 (×5): 100 mg
  Filled 2021-10-28 (×6): qty 10

## 2021-10-28 MED ORDER — POLYETHYLENE GLYCOL 3350 17 G PO PACK
17.0000 g | PACK | Freq: Every day | ORAL | Status: DC
  Administered 2021-10-29 – 2021-10-31 (×3): 17 g
  Filled 2021-10-28 (×3): qty 1

## 2021-10-28 MED ORDER — FUROSEMIDE 10 MG/ML IJ SOLN
40.0000 mg | Freq: Two times a day (BID) | INTRAMUSCULAR | Status: DC
  Administered 2021-10-28 (×2): 40 mg via INTRAVENOUS
  Filled 2021-10-28 (×2): qty 4

## 2021-10-28 MED ORDER — SODIUM CHLORIDE 0.9 % IV SOLN
250.0000 mL | INTRAVENOUS | Status: DC
  Administered 2021-10-28 – 2021-11-05 (×5): 250 mL via INTRAVENOUS

## 2021-10-28 MED ORDER — SODIUM CHLORIDE 0.9 % IV SOLN
2.0000 g | INTRAVENOUS | Status: DC
  Administered 2021-10-29 – 2021-10-30 (×2): 2 g via INTRAVENOUS
  Filled 2021-10-28 (×2): qty 2

## 2021-10-28 MED ORDER — MIDAZOLAM HCL 2 MG/2ML IJ SOLN
INTRAMUSCULAR | Status: AC
  Filled 2021-10-28: qty 2

## 2021-10-28 MED ORDER — PROPOFOL 10 MG/ML IV BOLUS
INTRAVENOUS | Status: AC
  Filled 2021-10-28: qty 20

## 2021-10-28 MED ORDER — PROPOFOL 10 MG/ML IV BOLUS
INTRAVENOUS | Status: DC | PRN
  Administered 2021-10-28: 130 mg via INTRAVENOUS

## 2021-10-28 MED ORDER — INSULIN ASPART 100 UNIT/ML IJ SOLN
0.0000 [IU] | INTRAMUSCULAR | Status: DC
Start: 2021-10-28 — End: 2021-11-01
  Administered 2021-10-28: 3 [IU] via SUBCUTANEOUS
  Administered 2021-10-29: 11 [IU] via SUBCUTANEOUS
  Administered 2021-10-29: 3 [IU] via SUBCUTANEOUS
  Administered 2021-10-29: 2 [IU] via SUBCUTANEOUS
  Administered 2021-10-29: 3 [IU] via SUBCUTANEOUS
  Administered 2021-10-29: 8 [IU] via SUBCUTANEOUS
  Administered 2021-10-29: 3 [IU] via SUBCUTANEOUS
  Administered 2021-10-30: 20:00:00 5 [IU] via SUBCUTANEOUS
  Administered 2021-10-30: 11 [IU] via SUBCUTANEOUS
  Administered 2021-10-30 (×3): 5 [IU] via SUBCUTANEOUS
  Administered 2021-10-30: 03:00:00 8 [IU] via SUBCUTANEOUS
  Administered 2021-10-30: 15:00:00 5 [IU] via SUBCUTANEOUS
  Administered 2021-10-31: 8 [IU] via SUBCUTANEOUS
  Administered 2021-10-31 (×3): 5 [IU] via SUBCUTANEOUS
  Administered 2021-10-31: 8 [IU] via SUBCUTANEOUS
  Administered 2021-11-01 (×2): 5 [IU] via SUBCUTANEOUS
  Administered 2021-11-01: 8 [IU] via SUBCUTANEOUS

## 2021-10-28 MED ORDER — ORAL CARE MOUTH RINSE
15.0000 mL | OROMUCOSAL | Status: DC
  Administered 2021-10-28 – 2021-11-14 (×169): 15 mL via OROMUCOSAL

## 2021-10-28 MED ORDER — FENTANYL CITRATE PF 50 MCG/ML IJ SOSY
25.0000 ug | PREFILLED_SYRINGE | INTRAMUSCULAR | Status: DC | PRN
  Administered 2021-10-28: 25 ug via INTRAVENOUS
  Administered 2021-10-28: 50 ug via INTRAVENOUS
  Administered 2021-10-28: 25 ug via INTRAVENOUS
  Filled 2021-10-28 (×3): qty 1

## 2021-10-28 MED ORDER — VITAL HIGH PROTEIN PO LIQD
1000.0000 mL | ORAL | Status: DC
  Administered 2021-10-28: 1000 mL

## 2021-10-28 MED ORDER — VANCOMYCIN HCL 1750 MG/350ML IV SOLN
1750.0000 mg | INTRAVENOUS | Status: DC
  Administered 2021-10-28: 1750 mg via INTRAVENOUS
  Filled 2021-10-28: qty 350

## 2021-10-28 MED ORDER — NOREPINEPHRINE 4 MG/250ML-% IV SOLN
2.0000 ug/min | INTRAVENOUS | Status: DC
  Filled 2021-10-28: qty 250

## 2021-10-28 MED ORDER — IPRATROPIUM BROMIDE 0.02 % IN SOLN
0.5000 mg | Freq: Three times a day (TID) | RESPIRATORY_TRACT | Status: DC
Start: 2021-10-28 — End: 2021-11-02
  Administered 2021-10-28 – 2021-11-02 (×12): 0.5 mg via RESPIRATORY_TRACT
  Filled 2021-10-28 (×12): qty 2.5

## 2021-10-28 MED ORDER — NOREPINEPHRINE 4 MG/250ML-% IV SOLN
2.0000 ug/min | INTRAVENOUS | Status: DC
  Administered 2021-10-28: 2 ug/min via INTRAVENOUS

## 2021-10-28 MED ORDER — SUCCINYLCHOLINE CHLORIDE 200 MG/10ML IV SOSY
PREFILLED_SYRINGE | INTRAVENOUS | Status: DC | PRN
  Administered 2021-10-28: 120 mg via INTRAVENOUS

## 2021-10-28 MED ORDER — SUCCINYLCHOLINE CHLORIDE 200 MG/10ML IV SOSY
PREFILLED_SYRINGE | INTRAVENOUS | Status: AC
  Filled 2021-10-28: qty 10

## 2021-10-28 MED ORDER — FENTANYL BOLUS VIA INFUSION
25.0000 ug | INTRAVENOUS | Status: DC | PRN
  Administered 2021-10-28: 50 ug via INTRAVENOUS
  Administered 2021-10-29 – 2021-11-02 (×14): 100 ug via INTRAVENOUS
  Administered 2021-11-04: 50 ug via INTRAVENOUS
  Administered 2021-11-04: 75 ug via INTRAVENOUS
  Administered 2021-11-04: 50 ug via INTRAVENOUS
  Administered 2021-11-04: 100 ug via INTRAVENOUS
  Administered 2021-11-04 (×3): 75 ug via INTRAVENOUS
  Administered 2021-11-05: 25 ug via INTRAVENOUS
  Administered 2021-11-05: 75 ug via INTRAVENOUS
  Administered 2021-11-05 (×2): 100 ug via INTRAVENOUS
  Administered 2021-11-05 (×2): 50 ug via INTRAVENOUS
  Administered 2021-11-05 – 2021-11-06 (×6): 100 ug via INTRAVENOUS
  Administered 2021-11-06: 50 ug via INTRAVENOUS
  Administered 2021-11-06: 75 ug via INTRAVENOUS
  Administered 2021-11-06 (×2): 100 ug via INTRAVENOUS
  Administered 2021-11-07 (×2): 25 ug via INTRAVENOUS
  Administered 2021-11-07: 50 ug via INTRAVENOUS
  Administered 2021-11-07: 25 ug via INTRAVENOUS
  Administered 2021-11-08: 50 ug via INTRAVENOUS
  Administered 2021-11-08: 25 ug via INTRAVENOUS
  Administered 2021-11-08: 100 ug via INTRAVENOUS
  Administered 2021-11-08 (×2): 50 ug via INTRAVENOUS
  Filled 2021-10-28: qty 100

## 2021-10-28 MED ORDER — VANCOMYCIN HCL 1500 MG/300ML IV SOLN
1500.0000 mg | Freq: Once | INTRAVENOUS | Status: DC
  Filled 2021-10-28: qty 300

## 2021-10-28 NOTE — Progress Notes (Signed)
Initial Nutrition Assessment  DOCUMENTATION CODES:      INTERVENTION:  When patient is stabilized and tube is placed, recommend initiate:  Vital 1.2 @ 40 ml/hr via OGT and increase by 10 ml every 6 hours to goal rate of 65 ml/hr.   Tube feeding regimen provides 1872 kcal (100% of needs), 117 grams of protein, and 1265 ml of H2O.     NUTRITION DIAGNOSIS:   Inadequate oral intake related to inability to eat as evidenced by NPO status.   GOAL:  Provide needs based on ASPEN/SCCM guidelines   MONITOR:  Vent status, Labs, I & O's, Skin, Weight trends  REASON FOR ASSESSMENT:   Ventilator    ASSESSMENT: Patient is a 65 yo male with hx of CHF, COPD, CAD, CKD-3a who presented with acute respiratory distress, positive for influenza A and AKI.    Patient intubated on ventilator support in process of transfer to [facility].   MV: 11.6 L/min Temp (24hrs), Avg:96.9 F (36.1 C), Min:95.6 F (35.3 C), Max:97.8 F (36.6 C)  Sedation: Versed and Fentanyl   Medications reviewed and include: Lasix, lipitor, insulin, solumedrol, tamiflu. Amiodarone   Intake/Output Summary (Last 24 hours) at 10/28/2021 1502 Last data filed at 10/28/2021 1422 Gross per 24 hour  Intake 1978.58 ml  Output 425 ml  Net 1553.58 ml     Labs: BMP Latest Ref Rng & Units 10/28/2021 10/27/2021 10/26/2021  Glucose 70 - 99 mg/dL 573(U) 202(R) 427(C)  BUN 8 - 23 mg/dL 62(B) 76(E) 83(T)  Creatinine 0.61 - 1.24 mg/dL 5.17(O) 1.60(V) 3.71(G)  BUN/Creat Ratio 10 - 24 - - -  Sodium 135 - 145 mmol/L 134(L) 135 137  Potassium 3.5 - 5.1 mmol/L 4.6 4.0 4.4  Chloride 98 - 111 mmol/L 105 105 108  CO2 22 - 32 mmol/L 15(L) 18(L) 17(L)  Calcium 8.9 - 10.3 mg/dL 8.2(L) 8.3(L) 8.3(L)      NUTRITION - FOCUSED PHYSICAL EXAM: Unable to complete Nutrition-Focused physical exam at this time.     Diet Order:   Diet Order             Diet NPO time specified  Diet effective now                   EDUCATION NEEDS:   Not appropriate for education at this time  Skin:  Skin Assessment: Skin Integrity Issues: Skin Integrity Issues:: Stage II, Stage I Stage I: coccyx Stage II: nose due to BiPap  Last BM:  unknown  Height:   Ht Readings from Last 1 Encounters:  10/28/21 5\' 11"  (1.803 m)    Weight:   Wt Readings from Last 1 Encounters:  10/25/21 80.5 kg    Ideal Body Weight:   78 kg  BMI:  Body mass index is 24.75 kg/m.  Estimated Nutritional Needs:   Kcal:  1800-2000  Protein:  113-130 gr  Fluid:  2 liters daily   10/27/21 MS,RD,CSG,LDN Contact: Royann Shivers

## 2021-10-28 NOTE — Progress Notes (Signed)
An USGPIV (ultrasound guided PIV) has been placed for short-term vasopressor infusion. Should this treatment be needed beyond 48hours, best practice supports seeking central line access.  It will be the responsibility of the bedside nurse to follow best practice to prevent extravasations including placing the IVWatch when indicated.   

## 2021-10-28 NOTE — Transfer of Care (Signed)
Immediate Anesthesia Transfer of Care Note  Patient: Joseph [name]  Procedure(s) Performed: AN AD HOC INTUBATION  Patient Location: ICU  Anesthesia Type:General  Level of Consciousness: Patient remains intubated per anesthesia plan  Airway & Oxygen Therapy: Patient remains intubated per anesthesia plan and Patient placed on Ventilator (see vital sign flow sheet for setting)  Post-op Assessment: Report given to RN and Post -op Vital signs reviewed and stable  Post vital signs: Reviewed  Last Vitals:  Vitals Value Taken Time  BP    Temp    Pulse 49 10/28/21 0953  Resp 29 10/28/21 1001  SpO2 98 % 10/28/21 0953  Vitals shown include unvalidated device data.  Last Pain:  Vitals:   10/28/21 0751  TempSrc: Axillary  PainSc:          Complications: No notable events documented.

## 2021-10-28 NOTE — Anesthesia Postprocedure Evaluation (Signed)
Anesthesia Post Note  Patient: Joseph [name]  Procedure(s) Performed: AN AD HOC INTUBATION  Patient location during evaluation: SICU Anesthesia Type: General Level of consciousness: sedated Pain management: pain level controlled Vital Signs Assessment: post-procedure vital signs reviewed and stable Respiratory status: patient remains intubated per anesthesia plan Cardiovascular status: stable Postop Assessment: no apparent nausea or vomiting Anesthetic complications: no   No notable events documented.   Last Vitals:  Vitals:   10/28/21 1006 10/28/21 1007  BP:    Pulse: (!) 50 86  Resp: 15 16  Temp:    SpO2: 91% 96%    Last Pain:  Vitals:   10/28/21 0751  TempSrc: Axillary  PainSc:                  Joseph [name]

## 2021-10-28 NOTE — Anesthesia Procedure Notes (Signed)
Procedure Name: Intubation Date/Time: 10/28/2021 10:06 AM Performed by: [name], MD Pre-anesthesia Checklist: Patient identified, Emergency Drugs available, Suction available, Patient being monitored and Timeout performed Patient Re-evaluated:Patient Re-evaluated prior to induction Oxygen Delivery Method: Circle system utilized Preoxygenation: Pre-oxygenation with 100% oxygen Induction Type: IV induction Ventilation: Mask ventilation without difficulty Laryngoscope Size: Glidescope and 3 Grade View: Grade I Tube type: Oral Tube size: 7.0 mm Number of attempts: 1 Airway Equipment and Method: Video-laryngoscopy and Stylet Placement Confirmation: ETT inserted through vocal cords under direct vision, positive ETCO2, breath sounds checked- equal and bilateral and CO2 detector Secured at: 22 cm Tube secured with: Tape Dental Injury: Teeth and Oropharynx as per pre-operative assessment

## 2021-10-28 NOTE — Progress Notes (Addendum)
Progress Note  Patient Name: Joseph [name] Date of Encounter: 10/28/2021  Primary Cardiologist: Rollene Rotunda, MD  Subjective   Requiring BiPAP consistently at this time.  No chest pain or palpitations, but continued shortness of breath with intermittent cough.  Anxious.  Inpatient Medications    Scheduled Meds:  aspirin EC  81 mg Oral QPM   atorvastatin  80 mg Oral QPM   budesonide (PULMICORT) nebulizer solution  0.5 mg Nebulization BID   Chlorhexidine Gluconate Cloth  6 each Topical Daily   enoxaparin (LOVENOX) injection  1 mg/kg Subcutaneous Q24H   furosemide  40 mg Intravenous BID   insulin aspart  0-5 Units Subcutaneous QHS   insulin aspart  0-9 Units Subcutaneous TID WC   ipratropium  0.5 mg Nebulization Q8H   levalbuterol  0.63 mg Nebulization Q8H   mouth rinse  15 mL Mouth Rinse BID   methylPREDNISolone (SOLU-MEDROL) injection  60 mg Intravenous Daily   metoprolol succinate  50 mg Oral Daily   oseltamivir  30 mg Oral BID   sodium chloride flush  10-40 mL Intracatheter Q12H   Continuous Infusions:  amiodarone 60 mg/hr (10/28/21 0625)   cefTRIAXone (ROCEPHIN)  IV 2 g (10/27/21 1748)   doxycycline (VIBRAMYCIN) IV 100 mg (10/28/21 0624)   promethazine (PHENERGAN) injection (IM or IVPB)     PRN Meds: acetaminophen **OR** acetaminophen, albuterol, levalbuterol, LORazepam, polyethylene glycol, promethazine (PHENERGAN) injection (IM or IVPB), sodium chloride flush   Vital Signs    Vitals:   10/28/21 0603 10/28/21 0700 10/28/21 0751 10/28/21 0800  BP: (!) 92/51 104/76  106/70  Pulse: (!) 57   (!) 107  Resp: (!) 27   (!) 27  Temp:   (!) 95.6 F (35.3 C)   TempSrc:   Axillary   SpO2: 98% 100%    Weight:      Height:        Intake/Output Summary (Last 24 hours) at 10/28/2021 0857 Last data filed at 10/28/2021 0400 Gross per 24 hour  Intake 1571.6 ml  Output 800 ml  Net 771.6 ml   Filed Weights   09/27/2021 1935 10/25/21 0043  Weight: 80.3 kg 80.5 kg     Telemetry    Atrial fibrillation/flutter.  Personally reviewed.  ECG    Tracing from 10/27/2021 shows atrial fibrillation/flutter with left bundle branch block.  Physical Exam   GEN: On BIPAP.   Neck: Difficult to assess JVP. Cardiac: Irregularly irregular, no gallop.  Respiratory: Diffuse rhonchi. Scattered crackles, decreased breath sounds at the bases.Marland Kitchen GI: Soft, nontender, bowel sounds present. MS: Mild ankle edema; No deformity. Neuro:  Nonfocal. Psych: Alert and oriented x 3.  Anxious.  Labs    Chemistry Recent Labs  Lab 10/19/2021 1828 10/25/21 0121 10/25/21 1945 10/26/21 0140 10/27/21 0432 10/28/21 0416  NA 134*   < > 132* 137 135 134*  K 4.3   < > 5.1 4.4 4.0 4.6  CL 103   < > 104 108 105 105  CO2 17*   < > 14* 17* 18* 15*  GLUCOSE 199*   < > 386* 129* 136* 193*  BUN 33*   < > 39* 46* 51* 55*  CREATININE 2.01*   < > 2.30* 2.07* 1.98* 2.60*  CALCIUM 9.2   < > 8.1* 8.3* 8.3* 8.2*  PROT 8.3*  --  6.7  --   --   --   ALBUMIN 4.1  --  3.0*  --   --   --  AST 40  --  56*  --   --   --   ALT 36  --  46*  --   --   --   ALKPHOS 121  --  103  --   --   --   BILITOT 0.7  --  0.4  --   --   --   GFRNONAA 36*   < > 30* 34* 36* 26*  ANIONGAP 14   < > 14 12 12 14    < > = values in this interval not displayed.     Hematology Recent Labs  Lab 10/25/21 1945 10/27/21 0432 10/28/21 0416  WBC 17.1* 17.1* 28.4*  RBC 5.48 4.81 5.27  HGB 16.2 14.1 15.7  HCT 50.7 42.6 48.1  MCV 92.5 88.6 91.3  MCH 29.6 29.3 29.8  MCHC 32.0 33.1 32.6  RDW 13.3 13.5 13.7  PLT 292 203 297    Cardiac Enzymes Recent Labs  Lab 10/25/21 0121 10/25/21 0708 10/25/21 1945 10/26/21 0140 10/26/21 0655  TROPONINIHS 466* 365* 281* 396* 305*    BNP Recent Labs  Lab 10/08/2021 1909 10/25/21 1945  BNP 243.0* 383.0*     DDimer Recent Labs  Lab 10/25/21 1945  DDIMER 2.30*     Radiology    DG Chest Port 1 View  Result Date: 10/28/2021 CLINICAL DATA:  Respiratory  distress. EXAM: PORTABLE CHEST 1 VIEW COMPARISON:  October 27, 2021 (10:13 a.m.) FINDINGS: Multiple sternal wires and vascular clips are seen. A single lead ventricular pacer is noted. Marked severity bilateral multifocal airspace disease is seen. This is increased in severity when compared to the prior study and slightly more prominent within the bilateral lower lobes. Mild layering bilateral pleural effusions are seen. No pneumothorax is identified. The heart size and mediastinal contours are within normal limits. The visualized skeletal structures are unremarkable. IMPRESSION: 1. Marked severity bilateral multifocal airspace disease, increased in severity when compared to the prior study. 2. Stable, partially layering bilateral pleural effusions. Electronically Signed   By: October 29, 2021 M.D.   On: 10/28/2021 00:35   DG Chest Port 1 View  Result Date: 10/27/2021 CLINICAL DATA:  COPD, pneumonia EXAM: PORTABLE CHEST 1 VIEW COMPARISON:  Chest radiograph dated October 25, 2021. FINDINGS: The heart is normal in size. Evidence of prior coronary artery bypass grafting. Left subclavian access AICD with distal tip in the right ventricle. There are bilateral lower lobe hazy opacities concerning for pleural effusion/infiltrate, unchanged. No appreciable pneumothorax. IMPRESSION: 1. Bilateral lower lobe hazy opacities concerning for airspace disease and/or pleural effusions, not significantly changed. 2. Cardiomediastinal silhouette is within normal limits with evidence of prior coronary artery bypass grafting. Electronically Signed   By: October 27, 2021 D.O.   On: 10/27/2021 13:26   14/11/2020 EKG SITE RITE  Result Date: 10/26/2021 If Site Rite image not attached, placement could not be confirmed due to current cardiac rhythm.   Assessment & Plan    1.  Persistent atrial fibrillation/flutter with RVR.  CHA2DS2-VASc score is 4.  He is on full dose Lovenox at this time.  We have continued IV amiodarone load and  uptitrated beta-blocker as tolerated.  Heart rates better controlled but not optimal, complicated by active comorbid illness.  2.  Influenza A and multifocal pneumonia with acute hypoxic respiratory failure.  Presently on ceftriaxone, doxycycline, and Tamiflu.  Also on Solu-Medrol and Xopenex/Pulmicort nebulizer treatments.  Procalcitonin is 5.05.  Also has elevated D-dimer 2.3, but at this point do not suspect high probability  of pulmonary embolus in light of present clinical scenario.  He has a leukocytosis on steroids, although this has further increased.  Chest x-ray also looks worse in terms of infiltrates, likely also associated edema.  Requiring BiPAP for support.  3.  HFrEF with ischemic cardiomyopathy and LVEF 35 to 40% by follow-up echocardiogram this admission.  Normal RV contraction.  GDMT has been interrupted due to recent hemodynamics and acute kidney injury.  Recent BNP 383.  Elevated high-sensitivity troponin I levels have been flat and consistent with myocardial strain rather than ACS.  Currently on aspirin and Lipitor.  4.  CAD status post CABG in 2000 with patent bypass grafts in October 2021.  5.  Moderate mitral regurgitation by recent echocardiogram.  6.  Acute kidney injury, creatinine up to 2.6 today.  Urine output has dropped off.  Discussed case with Dr. Arbutus Leas, also updated patient's daughter [name].  Overall clinical status is worsening. Suggest Pulmonary consultation and plan to transfer him to [facility]. Needs involvement of subspecialty services over the weekend and I suspect he is going to end up needing intubation and mechanical ventilation.  Lasix IV has been resumed for now, continue IV amiodarone and Toprol XL. If he is intubated could consider attempt at cardioversion as well, although rhythm control may be difficult in the current setting. Will check repeat troponin (doubt ACS or myocarditis based on initial levels, but will exclude further escalation).   Prognosis guarded.  Addendum: After further discussion with Dr. Arbutus Leas, Pulmonary consulted.  Patient also intubated per anesthesia without incident.  He converted back to sinus rhythm shortly thereafter and continues on amiodarone.  Signed, Nona Dell, MD  10/28/2021, 8:57 AM

## 2021-10-28 NOTE — Progress Notes (Signed)
PROGRESS NOTE  LEEROY [name] [name] DOB: 1956/08/11 DOA: 10/22/2021 PCP: [name], [name], MD   Brief History:  65 year old male with a history of COPD, CAD, diabetes, systolic congestive heart failure, admitted to the hospital with respiratory distress.  Initially required BiPAP for respiratory acidosis and hypoxia.  He tested positive for influenza A.  Started on Tamiflu/antibiotics as well as treatment for COPD exacerbation with bronchodilators and steroids.  Noted to have elevated troponin and possible some signs of pulmonary edema.  He was started on IV lasix.  Cardiology following.  Unfortunately, he continued to have bouts of afib with RVR during which he continued to have sob.  He was started on amiodarone drip and metoprolol succinate.  He remained on BiPAP.  Unfortunately, attempts to wean BiPAP were unsuccessful and his respiratory status continued to worsen, requiring increasing FiO2 on BiPAP despite lasix IV, IV abx, steroids and BDs.  Repeat CXR showed worsening bilateral infiltrates.  Case was discussed with cardiology, Dr. [name] and suggested transfer to [facility].  Given the patient's BiPAP dependence and high FiO2 requirement, it was felt that intubating the patient electively rather than emergently would be beneficial prior to the transfer to [facility].  This was discussed with the patient and his daughter who were in agreement.  Pulmonary/CCM, Dr. [name] was consulted to assist.   Assessment/Plan: Acute respiratory failure with hypoxia and hypercapnia -Likely multifactorial in the setting of influenza A infection, COPD and CHF exacerbation -Patient placed on BiPAP with improvement of acidosis -Clinically appears to be improving, will give a trial off of BiPAP and placed on nasal cannula -He is not chronically on oxygen -11/30 pm--developed RVR again>>given additional amiodarone bolus and increased amio drip to 60 mg--discussed with Dr. [name] -11/28 CT  chest--CM with interstitial edema and small-mod pleural effusions;  multifocal lung opacities along pleural effusions, peripheral GGO with LL bronchial wall thickening. -12/2 CXR--worsening bilateral infiltrates   Multifocal pneumonia/Influenza -Likely related to influenza -He is on Tamiflu D#4 of 5 -Procalcitonin elevated at 1.16>>5.05 -continue ceftriaxone and doxy -repeat PCT in am -MRSA screen neg   COPD with acute exacerbation -Started on intravenous steroids, bronchodilators -If continued respiratory improvement, can consider de-escalating steroids tomorrow prednisone taper -Continue pulmicort, xopenex, atrovent -continue IV solumedrol   Atrial Flutter/Atrial fibrillation with RVR -11/30 afternoon--rebolus amio and increase drip rate -lopressor IV given intermittently last 48 hours -metoprolol succinate increased to 50 mg -continue amiodarone drip -appreciate cardiology follow up   Elevated troponin -Suspect this may be demand ischemia in the setting of acute respiratory illness -11/29 Echo  EF 35-40%+WMA, mod MR, trivial TR -Appreciate cardiology input   Hemoptysis -hgb stable -V/Q scan if stable off BiPAP   Chronic systolic congestive heart failure -Last echocardiogram from 2020 shows EF of 30 to 35% -11/29 Echo  EF 35-40%+WMA, mod MR, trivial TR -Status post AICD -He is chronically on enalapril, spironolactone and metoprolol.  These have been held due to low blood pressures initially -Chronically takes torsemide, also held due to low blood pressures initially -received lasix 40 IV 11/29 and 60 mg IV  11/30 and 12/1   AKI on CKD stage IIIa -Baseline creatinine appears 1.4-1.6 -Admission creatinine noted to be 2.01 -Possibly related to hypotension on admission -He received 1 L bolus of IV fluids in the emergency room -Continue to hold ACE inhibitors   Coronary artery disease status post CABG -Continue aspirin and Lipitor -underwent CABG in 2000  with patent  grafts by cath in 2011 and patent grafts by repeat cath in 08/2020   Diabetes type 2, non-insulin-dependent -Chronically on Jardiance -Continue on sliding scale for now -Monitor blood sugars -08/30/21 A1C--5.6 -CBGs largely contrtolled   Stage 1 coccyx pressure injury -present on admission -local care       Status is: Inpatient   Remains inpatient appropriate because: due to severity of illness requiring BiPAP               Family Communication:   daughter updated at bedside 12/2   Consultants:  cardiology   Code Status:  FULL    DVT Prophylaxis:  [place] Lovenox     Procedures: As Listed in Progress Note Above   Antibiotics: Ceftriaxone 11/28>> Doxy 11/28>>       Subjective: Pt complains of sob.  He denies any cp, n/v/d, abd pain.  No f/c.  Objective: Vitals:   10/28/21 0700 10/28/21 0751 10/28/21 0800 10/28/21 0900  BP: 104/76  106/70 128/89  Pulse:   (!) 107   Resp:   (!) 27   Temp:  (!) 95.6 F (35.3 C)    TempSrc:  Axillary    SpO2: 100%     Weight:      Height:    5\' 11"  (1.803 m)    Intake/Output Summary (Last 24 hours) at 10/28/2021 0957 Last data filed at 10/28/2021 0400 Gross per 24 hour  Intake 1571.6 ml  Output 800 ml  Net 771.6 ml   Weight change:  Exam:  General:  Pt is alert, follows commands appropriately, not in acute distress HEENT: No icterus, No thrush, No neck mass, [place]/AT Cardiovascular: IRRR, S1/S2, no rubs, no gallops Respiratory: bilateral scattered rales. Abdomen: Soft/+BS, non tender, non distended, no guarding Extremities: trace LE edema, No lymphangitis, No petechiae, No rashes, no synovitis   Data Reviewed: I have personally reviewed following labs and imaging studies Basic Metabolic Panel: Recent Labs  Lab 10/08/2021 2306 10/25/21 0121 10/25/21 1945 10/26/21 0140 10/27/21 0432 10/28/21 0416  NA  --  137 132* 137 135 134*  K  --  4.4 5.1 4.4 4.0 4.6  CL  --  106 104 108 105 105  CO2  --  18* 14* 17* 18*  15*  GLUCOSE  --  127* 386* 129* 136* 193*  BUN  --  34* 39* 46* 51* 55*  CREATININE  --  1.80* 2.30* 2.07* 1.98* 2.60*  CALCIUM  --  8.1* 8.1* 8.3* 8.3* 8.2*  MG 1.9  --  2.3 2.3 2.3  --    Liver Function Tests: Recent Labs  Lab 10/22/2021 1828 10/25/21 1945  AST 40 56*  ALT 36 46*  ALKPHOS 121 103  BILITOT 0.7 0.4  PROT 8.3* 6.7  ALBUMIN 4.1 3.0*   No results for input(s): LIPASE, AMYLASE in the last 168 hours. No results for input(s): AMMONIA in the last 168 hours. Coagulation Profile: No results for input(s): INR, PROTIME in the last 168 hours. CBC: Recent Labs  Lab 10/04/2021 1828 10/25/21 0121 10/25/21 1945 10/27/21 0432 10/28/21 0416  WBC 11.8* 12.0* 17.1* 17.1* 28.4*  NEUTROABS 5.8  --   --   --   --   HGB 17.9* 14.2 16.2 14.1 15.7  HCT 58.7* 42.4 50.7 42.6 48.1  MCV 95.6 90.0 92.5 88.6 91.3  PLT 186 166 292 203 297   Cardiac Enzymes: No results for input(s): CKTOTAL, CKMB, CKMBINDEX, TROPONINI in the last 168 hours. BNP: Invalid input(s):  POCBNP CBG: Recent Labs  Lab 10/27/21 0748 10/27/21 1147 10/27/21 1626 10/27/21 2111 10/28/21 0734  GLUCAP 123* 143* 174* 175* 146*   HbA1C: No results for input(s): HGBA1C in the last 72 hours. Urine analysis:    Component Value Date/Time   COLORURINE YELLOW 03/12/2010 1655   APPEARANCEUR CLEAR 03/12/2010 1655   LABSPEC 1.010 03/12/2010 1655   PHURINE 6.0 03/12/2010 1655   GLUCOSEU NEGATIVE 03/12/2010 1655   HGBUR MODERATE (A) 03/12/2010 1655   BILIRUBINUR NEGATIVE 03/12/2010 1655   KETONESUR TRACE (A) 03/12/2010 1655   PROTEINUR 30 (A) 03/12/2010 1655   UROBILINOGEN 1.0 03/12/2010 1655   NITRITE NEGATIVE 03/12/2010 1655   LEUKOCYTESUR NEGATIVE 03/12/2010 1655   Sepsis Labs: @LABRCNTIP (procalcitonin:4,lacticidven:4) ) Recent Results (from the past 240 hour(s))  Resp Panel by RT-PCR (Flu A&B, Covid) Nasopharyngeal Swab     Status: Abnormal   Collection Time: 09/29/2021  6:28 PM   Specimen: Nasopharyngeal  Swab; Nasopharyngeal(NP) swabs in vial transport medium  Result Value Ref Range Status   SARS Coronavirus 2 by RT PCR NEGATIVE NEGATIVE Final    Comment: (NOTE) SARS-CoV-2 target nucleic acids are NOT DETECTED.  The SARS-CoV-2 RNA is generally detectable in upper respiratory specimens during the acute phase of infection. The lowest concentration of SARS-CoV-2 viral copies this assay can detect is 138 copies/mL. A negative result does not preclude SARS-Cov-2 infection and should not be used as the sole basis for treatment or other patient management decisions. A negative result may occur with  improper specimen collection/handling, submission of specimen other than nasopharyngeal swab, presence of viral mutation(s) within the areas targeted by this assay, and inadequate number of viral copies(<138 copies/mL). A negative result must be combined with clinical observations, patient history, and epidemiological information. The expected result is Negative.  Fact Sheet for Patients:  EntrepreneurPulse.com.au  Fact Sheet for Healthcare Providers:  IncredibleEmployment.be  This test is no t yet approved or cleared by the [place] FDA and  has been authorized for detection and/or diagnosis of SARS-CoV-2 by FDA under an Emergency Use Authorization (EUA). This EUA will remain  in effect (meaning this test can be used) for the duration of the COVID-19 declaration under Section 564(b)(1) of the Act, 21 U.S.C.section 360bbb-3(b)(1), unless the authorization is terminated  or revoked sooner.       Influenza A by PCR POSITIVE (A) NEGATIVE Final   Influenza B by PCR NEGATIVE NEGATIVE Final    Comment: (NOTE) The Xpert Xpress SARS-CoV-2/FLU/RSV plus assay is intended as an aid in the diagnosis of influenza from Nasopharyngeal swab specimens and should not be used as a sole basis for treatment. Nasal washings and aspirates are unacceptable for Xpert  Xpress SARS-CoV-2/FLU/RSV testing.  Fact Sheet for Patients: EntrepreneurPulse.com.au  Fact Sheet for Healthcare Providers: IncredibleEmployment.be  This test is not yet approved or cleared by the [place] FDA and has been authorized for detection and/or diagnosis of SARS-CoV-2 by FDA under an Emergency Use Authorization (EUA). This EUA will remain in effect (meaning this test can be used) for the duration of the COVID-19 declaration under Section 564(b)(1) of the Act, 21 U.S.C. section 360bbb-3(b)(1), unless the authorization is terminated or revoked.  Performed at [facility], [address] [place], [place] [address]   Culture, blood (routine x 2)     Status: None (Preliminary result)   Collection Time: 10/15/2021  7:09 PM   Specimen: Left Antecubital; Blood  Result Value Ref Range Status   Specimen Description LEFT ANTECUBITAL  Final  Special Requests   Final    BOTTLES DRAWN AEROBIC AND ANAEROBIC Blood Culture results may not be optimal due to an inadequate volume of blood received in culture bottles   Culture   Final    NO GROWTH 4 DAYS Performed at [facility], [address] [place], [place] [address]    Report Status PENDING  Incomplete  Culture, blood (routine x 2)     Status: None (Preliminary result)   Collection Time: 09/28/2021  7:11 PM   Specimen: BLOOD RIGHT FOREARM  Result Value Ref Range Status   Specimen Description BLOOD RIGHT FOREARM  Final   Special Requests   Final    BOTTLES DRAWN AEROBIC AND ANAEROBIC Blood Culture results may not be optimal due to an inadequate volume of blood received in culture bottles   Culture   Final    NO GROWTH 4 DAYS Performed at [facility], [address], [place], [place] [address]    Report Status PENDING  Incomplete  MRSA Next Gen by PCR, Nasal     Status: None   Collection Time: 10/25/21 12:32 AM   Specimen: Nasal Mucosa; Nasal Swab  Result Value Ref Range Status   MRSA by  PCR Next Gen NOT DETECTED NOT DETECTED Final    Comment: (NOTE) The GeneXpert MRSA Assay (FDA approved for NASAL specimens only), is one component of a comprehensive MRSA colonization surveillance program. It is not intended to diagnose MRSA infection nor to guide or monitor treatment for MRSA infections. Test performance is not FDA approved in patients less than 31 years old. Performed at [facility] [address], [place], [place] [address]      Scheduled Meds:  aspirin EC  81 mg Oral QPM   atorvastatin  80 mg Oral QPM   budesonide (PULMICORT) nebulizer solution  0.5 mg Nebulization BID   Chlorhexidine Gluconate Cloth  6 each Topical Daily   enoxaparin (LOVENOX) injection  1 mg/kg Subcutaneous Q24H   etomidate       fentaNYL       fentaNYL       furosemide  40 mg Intravenous BID   insulin aspart  0-5 Units Subcutaneous QHS   insulin aspart  0-9 Units Subcutaneous TID WC   ipratropium  0.5 mg Nebulization Q8H   levalbuterol  0.63 mg Nebulization Q8H   mouth rinse  15 mL Mouth Rinse BID   methylPREDNISolone (SOLU-MEDROL) injection  60 mg Intravenous Daily   metoprolol succinate  50 mg Oral Daily   midazolam       oseltamivir  30 mg Oral BID   rocuronium bromide       sodium chloride flush  10-40 mL Intracatheter Q12H   succinylcholine       Continuous Infusions:  amiodarone 60 mg/hr (10/28/21 0625)   cefTRIAXone (ROCEPHIN)  IV 2 g (10/27/21 1748)   doxycycline (VIBRAMYCIN) IV 100 mg (10/28/21 0624)   promethazine (PHENERGAN) injection (IM or IVPB)     propofol      Procedures/Studies: CT CHEST WO CONTRAST  Result Date: 10/19/2021 CLINICAL DATA:  Respiratory failure with hypoxia, hypercapnia, influenza infection and elevated creatinine. EXAM: CT CHEST WITHOUT CONTRAST TECHNIQUE: Multidetector CT imaging of the chest was performed following the standard protocol without IV contrast. COMPARISON:  Portable chest today, portable chest 08/23/2021, and CTA chest 03/12/2010.  FINDINGS: Cardiovascular: Mild cardiomegaly with left chamber predominance is again noted with pacemaker wiring in the heart and left chest battery again causing streak artifact. Native coronary  arteries are heavily calcified with again noted old CABG and healed sternotomy changes. There is no pericardial effusion. There is mild distention of the superior pulmonary veins compared to the prior CTA, and interval increased slight prominence of the pulmonary trunk measuring 3.1 cm suggesting arterial hypertension. There is patchy calcification in the aorta and great vessels but no aortic aneurysm. Mediastinum/Nodes: No enlarged mediastinal or axillary lymph nodes. Thyroid gland, trachea, and esophagus demonstrate no significant findings. There is a small to moderate hiatal hernia again noted however, which contains mostly fat. Lungs/Pleura: There are patchy ground-glass opacities in the upper lobes which predominate in the peripheral lungs, nonlocalizing faint ground-glass opacities in the infrahilar lower lobes, and increased opacity in the posterior lower lobes alongside small to moderate-sized pleural effusions which could be atelectasis or consolidation. There is interstitial prominence in the lung apices and bases and in the lower zonal parahilar regions, with a distribution most suggestive of interstitial edema. The central airways show no focal filling defects but do show bronchial thickening in the lower lobes. Upper Abdomen: No acute abnormality.  Old cholecystectomy. Musculoskeletal: No chest wall mass or suspicious bone lesions identified. IMPRESSION: 1. Cardiomegaly with interstitial edema and small to moderate symmetric layering pleural effusions consistent with CHF or fluid overload. 2. Multifocal lung opacities. Opacities alongside the pleural effusions could be atelectasis or consolidation. Peripheral-predominant ground-glass opacities in the upper lobes are probably due to pneumonitis including viral  etiologies. Ground-glass central opacities are likely ground-glass edema and there is bronchial thickening in both lower lobes. 3. Calcific CAD with remote CABG and pacing system. 4. Slightly prominent pulmonary trunk but no right heart chamber enlargement. 5. Follow-up study recommended after treatment to ensure clearing of opacities. Electronically Signed   By: Telford Nab M.D.   On: 10/02/2021 23:00   DG Chest Port 1 View  Result Date: 10/28/2021 CLINICAL DATA:  Respiratory distress. EXAM: PORTABLE CHEST 1 VIEW COMPARISON:  October 27, 2021 (10:13 a.m.) FINDINGS: Multiple sternal wires and vascular clips are seen. A single lead ventricular pacer is noted. Marked severity bilateral multifocal airspace disease is seen. This is increased in severity when compared to the prior study and slightly more prominent within the bilateral lower lobes. Mild layering bilateral pleural effusions are seen. No pneumothorax is identified. The heart size and mediastinal contours are within normal limits. The visualized skeletal structures are unremarkable. IMPRESSION: 1. Marked severity bilateral multifocal airspace disease, increased in severity when compared to the prior study. 2. Stable, partially layering bilateral pleural effusions. Electronically Signed   By: Virgina Norfolk M.D.   On: 10/28/2021 00:35   DG Chest Port 1 View  Result Date: 10/27/2021 CLINICAL DATA:  COPD, pneumonia EXAM: PORTABLE CHEST 1 VIEW COMPARISON:  Chest radiograph dated October 25, 2021. FINDINGS: The heart is normal in size. Evidence of prior coronary artery bypass grafting. Left subclavian access AICD with distal tip in the right ventricle. There are bilateral lower lobe hazy opacities concerning for pleural effusion/infiltrate, unchanged. No appreciable pneumothorax. IMPRESSION: 1. Bilateral lower lobe hazy opacities concerning for airspace disease and/or pleural effusions, not significantly changed. 2. Cardiomediastinal silhouette is  within normal limits with evidence of prior coronary artery bypass grafting. Electronically Signed   By: Keane Police D.O.   On: 10/27/2021 13:26   DG CHEST PORT 1 VIEW  Result Date: 10/25/2021 CLINICAL DATA:  Shortness of breath.  History of COPD. EXAM: PORTABLE CHEST 1 VIEW COMPARISON:  Chest x-ray 10/25/2021.  CT chest 10/01/2021. FINDINGS: There are  increasing patchy airspace opacities in the lower lungs bilaterally. Patchy slightly nodular opacities in the mid lungs persists, unchanged from prior CT. Small pleural effusions are present. There is no pneumothorax. The cardiomediastinal silhouette is stable, the heart is enlarged. Patient is status post cardiac surgery. Left-sided pacemaker is present. There are no acute fractures. IMPRESSION: 1. Increasing bilateral airspace disease in the lower lobes. 2. Stable small pleural effusions. Electronically Signed   By: Darliss Cheney M.D.   On: 10/25/2021 19:27   DG Chest Portable 1 View  Result Date: 10/23/2021 CLINICAL DATA:  Respiratory distress. EXAM: PORTABLE CHEST 1 VIEW COMPARISON:  Chest x-rays dated 08/23/2021 and 05/03/2021. FINDINGS: Mildly increased interstitial markings bilaterally compared to the earlier exams, suggesting acute interstitial edema/thickening superimposed on chronic interstitial lung disease/fibrosis. No confluent opacity to suggest a consolidating pneumonia. No pleural effusion or pneumothorax is seen. Heart size and mediastinal contours are stable. LEFT chest wall pacemaker/ICD apparatus appears stable in position. Osseous structures about the chest are unremarkable. IMPRESSION: New interstitial prominence bilaterally indicating mild volume overload versus multifocal atypical pneumonia, superimposed on chronic interstitial lung disease/fibrosis. Electronically Signed   By: Bary Richard M.D.   On: 10/11/2021 19:08   ECHOCARDIOGRAM COMPLETE  Result Date: 10/25/2021    ECHOCARDIOGRAM REPORT   Patient Name:   Joseph [name]  Date of Exam: 10/25/2021 Medical Rec #:  [id number]     Height:       71.0 in Accession #:    [id number]    Weight:       177.5 lb Date of Birth:  18-Apr-1956      BSA:          2.004 m Patient Age:    65 years      BP:           103/54 mmHg Patient Gender: M             HR:           69 bpm. Exam Location:  Inpatient Procedure: 2D Echo, 3D Echo, Cardiac Doppler and Color Doppler Indications:    R07.9* Chest pain, unspecified. Elevated troponin.  History:        Patient has prior history of Echocardiogram examinations, most                 recent 01/28/2019. Cardiomyopathy and CHF, CAD, Prior CABG,                 Defibrillator and Abnormal ECG, COPD; Risk Factors:Hypertension.                 Patient is flu positive.  Sonographer:    Sheralyn Boatman RDCS Referring Phys: [id number] [facility]  Sonographer Comments: Technically difficult study due to poor echo windows. Pateient coughing throughout test. IMPRESSIONS  1. Left ventricular ejection fraction, by estimation, is 35 to 40%. The left ventricle has moderately decreased function. The left ventricle demonstrates regional wall motion abnormalities (see scoring diagram/findings for description). The mid-to-apical anteroseptal LV wall segments appear akinetic. The rest of the LV segments appear mildly-to-moderately hypokinetic. The left ventricular internal cavity size was mildly dilated. Indeterminate diastolic filling due to E-A fusion.  2. Right ventricular systolic function is normal. The right ventricular size is normal. Tricuspid regurgitation signal is inadequate for assessing PA pressure.  3. 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 The aortic valve is tricuspid. There is mild calcification of the  aortic valve. There is mild thickening of the aortic valve. Aortic valve regurgitation is not visualized.  5. The inferior vena cava is dilated in size with >50% respiratory  variability, suggesting right atrial pressure of 8 mmHg.  6. Left atrial size was severely dilated.  7. Right atrial size was moderately dilated. Comparison(s): Compared to prior TTE in 01/2019, the LVEF appears mildly improved to 35-40% (previously 30-35%). The mitral regurgitation now appears moderate (previously mild). FINDINGS  Left Ventricle: Left ventricular ejection fraction, by estimation, is 35 to 40%. The left ventricle has moderately decreased function. The left ventricle demonstrates regional wall motion abnormalities. The mid-to-apical anteroseptal LV wall segments appear akinetic. The rest of the LV segments are mildly-to-moderately hypokinetic. 3D left ventricular ejection fraction analysis performed but not reported based on interpreter judgement due to suboptimal tracking. The left ventricular internal cavity size was mildly dilated. There is no left ventricular hypertrophy. Indeterminate diastolic filling due to E-A fusion. Right Ventricle: The right ventricular size is normal. No increase in right ventricular wall thickness. Right ventricular systolic function is normal. Tricuspid regurgitation signal is inadequate for assessing PA pressure. Left Atrium: Left atrial size was severely dilated. Right Atrium: Right atrial size was moderately dilated. Pericardium: There is no evidence of pericardial effusion. Mitral Valve: The mitral valve is normal in structure. There is mild thickening of the mitral valve leaflet(s). There is mild calcification of the mitral valve leaflet(s). Mild mitral annular calcification. There is tethering of the posterior mitral valve leafltet with resultant moderate, posteriorly directed mitral regurgitation. MV peak gradient, 10.8 mmHg. The mean mitral valve gradient is 3.0 mmHg. Tricuspid Valve: The tricuspid valve is normal in structure. Tricuspid valve regurgitation is trivial. Aortic Valve: The aortic valve is tricuspid. There is mild calcification of the aortic valve.  There is mild thickening of the aortic valve. Aortic valve regurgitation is not visualized. Pulmonic Valve: The pulmonic valve was normal in structure. Pulmonic valve regurgitation is trivial. Aorta: The aortic root and ascending aorta are structurally normal, with no evidence of dilitation. Venous: The inferior vena cava is dilated in size with greater than 50% respiratory variability, suggesting right atrial pressure of 8 mmHg. IAS/Shunts: No atrial level shunt detected by color flow Doppler. Additional Comments: A device lead is visualized.  LEFT VENTRICLE PLAX 2D LVIDd:         3.31 cm      Diastology LVIDs:         2.91 cm      LV e' medial:    7.62 cm/s LV PW:         3.62 cm      LV E/e' medial:  17.1 LV IVS:        0.66 cm      LV e' lateral:   12.40 cm/s LVOT diam:     1.90 cm      LV E/e' lateral: 10.5 LV SV:         46 LV SV Index:   23 LVOT Area:     2.84 cm                              3D Volume EF: LV Volumes (MOD)            3D EF:        35 % LV vol d, MOD A2C: 132.0 ml LV EDV:       207 ml LV vol d,  MOD A4C: 133.0 ml LV ESV:       135 ml LV vol s, MOD A2C: 69.6 ml  LV SV:        71 ml LV vol s, MOD A4C: 85.8 ml LV SV MOD A2C:     62.4 ml LV SV MOD A4C:     133.0 ml LV SV MOD BP:      61.6 ml RIGHT VENTRICLE             IVC RV S prime:     11.30 cm/s  IVC diam: 2.20 cm TAPSE (M-mode): 1.1 cm LEFT ATRIUM             Index        RIGHT ATRIUM           Index LA diam:        3.60 cm 1.80 cm/m   RA Area:     12.50 cm LA Vol (A2C):   57.4 ml 28.64 ml/m  RA Volume:   25.30 ml  12.62 ml/m LA Vol (A4C):   54.4 ml 27.14 ml/m LA Biplane Vol: 57.4 ml 28.64 ml/m  AORTIC VALVE LVOT Vmax:   120.00 cm/s LVOT Vmean:  78.400 cm/s LVOT VTI:    0.162 m  AORTA Ao Root diam: 3.30 cm Ao Asc diam:  3.30 cm MITRAL VALVE MV Area (PHT): 2.83 cm       SHUNTS MV Area VTI:   1.71 cm       Systemic VTI:  0.16 m MV Peak grad:  10.8 mmHg      Systemic Diam: 1.90 cm MV Mean grad:  3.0 mmHg MV Vmax:       1.64 m/s MV Vmean:       75.0 cm/s MV Decel Time: 268 msec MR Peak grad:    58.7 mmHg MR Mean grad:    33.0 mmHg MR Vmax:         383.00 cm/s MR Vmean:        269.0 cm/s MR PISA:         1.57 cm MR PISA Eff ROA: 16 mm MR PISA Radius:  0.50 cm MV E velocity: 130.00 cm/s Gwyndolyn Kaufman MD Electronically signed by Gwyndolyn Kaufman MD Signature Date/Time: 10/25/2021/9:14:44 PM    Final    CUP PACEART REMOTE DEVICE CHECK  Result Date: 10/06/2021 Scheduled remote reviewed. Normal device function.  Battery estimated 6.66mo Next remote 12/12 LR  [place] EKG SITE RITE  Result Date: 10/26/2021 If Site Rite image not attached, placement could not be confirmed due to current cardiac rhythm.   Orson Eva, DO  [facility]  If 7PM-7AM, please contact night-coverage [email or web address] Password [email or web address] 10/28/2021, 9:57 AM   LOS: 4 days

## 2021-10-28 NOTE — Progress Notes (Signed)
eLink Physician-Brief Progress Note Patient Name: Joseph [name] DOB: [birth year] MRN: [id number]   Date of Service  10/28/2021  HPI/Events of Note  Patient is intubated and requires an order for soft wrist restraints to prevent self extubation.  eICU Interventions  Soft wrist restraints ordered.        [name] 10/28/2021, 8:20 PM

## 2021-10-28 NOTE — Progress Notes (Signed)
Approximately 69ml of discontinued versed gtt wasted by Vincent Gros, RN and this RN.

## 2021-10-28 NOTE — Progress Notes (Addendum)
Patient agreeable to intubation with daughter at bedside.  Anesthesia called to perform intubation.  1003: 140mg  of Propofol given 1004: 140mg  of Succinylcholine 1005: intubation performed, 22 @ lip 7.5 ETT 1006: Patient noted to have converted to Sinus Rhythm. Dr. with cardiology made aware. Still continuing amiodarone infusion.

## 2021-10-28 NOTE — Progress Notes (Signed)
Pharmacy Antibiotic Note  JAYQUON THEILER is a 65 y.o. male admitted to [facility] on 11/13/2021 with difficulty breathing.  Patient has been on Rocephin and doxycycline for CAP and Tamiflu for Flu A.  With deterioration, patient was intubated and transferred to [facility].  Pharmacy has been consulted to broaden antibiotics to vancomycin and cefepime.  Patient has an AKI - SCr 1.8 > 2.6, CrCL 29 ml/min, hypothermia, WBC up to 28.4, PCT up to 5.  Plan: Vanc 1700mg  IV Q48H for AUC 500 using SCr 2.6 Cefepime 2gm IV Q24H Monitor renal fxn, clinical progress, vanc levels as indicated  May need to dose vanc per level pending renal fxn   Height: 5\' 11"  (180.3 cm) Weight: 80.5 kg (177 lb 7.5 oz) IBW/kg (Calculated) : 75.3  Temp (24hrs), Avg:97.2 F (36.2 C), Min:95.6 F (35.3 C), Max:98.4 F (36.9 C)  Recent Labs  Lab November 13, 2021 1828 11-13-2021 2029 10/25/21 0121 10/25/21 1945 10/26/21 0140 10/27/21 0432 10/28/21 0416  WBC 11.8*  --  12.0* 17.1*  --  17.1* 28.4*  CREATININE 2.01*  --  1.80* 2.30* 2.07* 1.98* 2.60*  LATICACIDVEN 4.5* 2.4*  --   --   --   --   --     Estimated Creatinine Clearance: 29.4 mL/min (A) (by C-G formula based on SCr of 2.6 mg/dL (H)).    Allergies  Allergen Reactions   Entresto [Sacubitril-Valsartan]     Worsening renal function and weight gain    Tamiflu 11/28 >> 12/3 Azith x1 11/28 CTX 11/28 >> 12/2 Doxy 11/29 >> 12/2 Vanc 12.2 >> Cefepime 12/2 >>  11/28 resp panel PCR - Flu A 11/28 BCx - NGTD 12/29 MRSA PCR - negative 12/2 TA -  12/2 MRSA PCR -   Danna Casella D. 14/2, PharmD, BCPS, BCCCP 10/28/2021, 4:00 PM

## 2021-10-28 NOTE — H&P (Signed)
NAME:  Joseph [name], MRN:  [id number], DOB:  October 12, 1956, LOS: 4 ADMISSION DATE:  10/04/2021, CONSULTATION DATE:  10/28/2021 REFERRING MD:  Dr. Carles Collet, CHIEF COMPLAINT:  Acute hypoxic respiratory failure    History of Present Illness:  Joseph [name] is a 65 y.o. male with a PMH significant for CHF, COPD, CAD s/p CABG, HTN, HLD, MRSA bacteremia, and prior GI bleed who presented to the ED for acute respiratory distress.   On ED arrival patient was placed on BIPAP for significant hypoxia. Workup revealed patient was Flu A positive with underlying COPD exacerbation and likely CHF exacerbation with component of pulmonary edema as well. He was treated with IV diuretics, Tamaflu, CAP coverage, Amiodarone drip with beta blocker for persistent A-fib, and steroids with limited improvement in respiratory status leading to the decision to intubated and transfer to [facility] for further pulmonary and cardiac care   Pertinent  Medical History  CHF, COPD, CAD s/p CABG, HTN, HLD, MRSA bacteremia, and prior GI bleed  Significant Hospital Events: Including procedures, antibiotic start and stop dates in addition to other pertinent events   11/28 admitted at [facility] for acute hypoxic respiratory failure  12/2 intubated and [facility]   Interim History / Subjective:  As above   Objective   Blood pressure (!) 87/60, pulse 74, temperature (!) 96.7 F (35.9 C), temperature source Axillary, resp. rate 18, height 5\' 11"  (1.803 m), weight 80.5 kg, SpO2 96 %.    Vent Mode: PRVC FiO2 (%):  [45 %-100 %] 100 % Set Rate:  [18 bmp] 18 bmp Vt Set:  [600 mL] 600 mL PEEP:  [5 cmH20] 5 cmH20 Plateau Pressure:  [18 [id number] cmH20] 18 cmH20   Intake/Output Summary (Last 24 hours) at 10/28/2021 1425 Last data filed at 10/28/2021 1422 Gross per 24 hour  Intake 2098.58 ml  Output 625 ml  Net 1473.58 ml   Filed Weights   10/16/2021 1935 10/25/21 0043  Weight: 80.3 kg 80.5 kg    Examination: General: Acute on  chronically ill appearing elderly male lying in bed  on mechanical ventilation, in NAD HEENT: ETT, MM pink/moist, PERRL,  Neuro: Sedated on vent CV: s1s2 regular rate and rhythm, no murmur, rubs, or gallops,  PULM:  Clear to ascultation bilaterally, no increased work of breathing, tolerating vent  GI: soft, bowel sounds active in all 4 quadrants, non-tender, non-distended, tolerating TF Extremities: warm/dry, no edema  Skin: no rashes or lesions  Resolved Hospital Problem list     Assessment & Plan:  Acute Hypoxic and Hypercapnic Respiratory Failure  -Likely multifactorial to include influenza A, COPD and CHF exacerbation  Flu A positive  Acute COPD exacerbation  P: Continue ventilator support with lung protective strategies  Wean PEEP and FiO2 for sats greater than 90%. Head of bed elevated 30 degrees. Plateau pressures less than 30 cm H20.  Follow intermittent chest x-ray and ABG.   SAT/SBT as tolerated, mentation preclude extubation  Ensure adequate pulmonary hygiene  Follow cultures  VAP bundle in place  PAD protocol Continue Tamiflu, Ceftriaxone, and Doxy  Procalcitonin elevated  Continue BDs IV steroids   Hx of HFrEF with ischemic cardiomyopathy  -EF 35-40% Persistent A-Flutter/A-fib with RVR -CHA2DS2-VASc score 4 Elevated troponin with hx of CAD -s/p CABG 2000 P: Cardiology following  Continue full dose Lovenox  Rate controlled on IV Amiodarone  Continue Beta Blocker  Continuous telemetry  Heart health diet with sodium restriction when able  Strict intake and  output  Daily weight to assess volume status Daily assessment for need to diurese with the use of IV lasix   Closely monitor renal function and electrolytes  Trend troponin    Acute Kidney Injury  -Baseline creatinine 1.4-1.6 with admission creatinine 2.01 P: Follow renal function  Monitor urine output Trend Bmet Avoid nephrotoxins Ensure adequate renal perfusion   Type 2 diabetes  -Home  medication included Jardiance, Hgb A1C 5.6 P: Continue SSI CBG goal 140-180  Stage 1 coccyx pressure injury P: Pressure alleviating devices  Local care    Best Practice (right click and "Reselect all SmartList Selections" daily)   Diet/type: tubefeeds DVT prophylaxis: LMWH GI prophylaxis: PPI Lines: N/A Foley:  N/A Code Status:  full code Last date of multidisciplinary goals of care discussion: Pending  Labs   CBC: Recent Labs  Lab 10/23/2021 1828 10/25/21 0121 10/25/21 1945 10/27/21 0432 10/28/21 0416  WBC 11.8* 12.0* 17.1* 17.1* 28.4*  NEUTROABS 5.8  --   --   --   --   HGB 17.9* 14.2 16.2 14.1 15.7  HCT 58.7* 42.4 50.7 42.6 48.1  MCV 95.6 90.0 92.5 88.6 91.3  PLT 186 166 292 203 [id number]    Basic Metabolic Panel: Recent Labs  Lab 10/26/2021 2306 10/25/21 0121 10/25/21 1945 10/26/21 0140 10/27/21 0432 10/28/21 0416  NA  --  137 132* 137 135 134*  K  --  4.4 5.1 4.4 4.0 4.6  CL  --  106 104 108 105 105  CO2  --  18* 14* 17* 18* 15*  GLUCOSE  --  127* 386* 129* 136* 193*  BUN  --  34* 39* 46* 51* 55*  CREATININE  --  1.80* 2.30* 2.07* 1.98* 2.60*  CALCIUM  --  8.1* 8.1* 8.3* 8.3* 8.2*  MG 1.9  --  2.3 2.3 2.3  --    GFR: Estimated Creatinine Clearance: 29.4 mL/min (A) (by C-G formula based on SCr of 2.6 mg/dL (H)). Recent Labs  Lab 10/07/2021 1828 10/13/2021 2029 10/02/2021 2306 10/25/21 0121 10/25/21 1945 10/27/21 0432 10/28/21 0416  PROCALCITON  --   --  1.16  --   --  5.05  --   WBC 11.8*  --   --  12.0* 17.1* 17.1* 28.4*  LATICACIDVEN 4.5* 2.4*  --   --   --   --   --     Liver Function Tests: Recent Labs  Lab 10/19/2021 1828 10/25/21 1945  AST 40 56*  ALT 36 46*  ALKPHOS 121 103  BILITOT 0.7 0.4  PROT 8.3* 6.7  ALBUMIN 4.1 3.0*   No results for input(s): LIPASE, AMYLASE in the last 168 hours. No results for input(s): AMMONIA in the last 168 hours.  ABG    Component Value Date/Time   PHART 7.288 (L) 10/28/2021 1130   PCO2ART 36.6  10/28/2021 1130   PO2ART 85.2 10/28/2021 1130   HCO3 17.5 (L) 10/28/2021 1130   TCO2 28 09/23/2020 0906   ACIDBASEDEF 8.2 (H) 10/28/2021 1130   O2SAT 94.2 10/28/2021 1130     Coagulation Profile: No results for input(s): INR, PROTIME in the last 168 hours.  Cardiac Enzymes: No results for input(s): CKTOTAL, CKMB, CKMBINDEX, TROPONINI in the last 168 hours.  HbA1C: HB A1C (BAYER DCA - WAIVED)  Date/Time Value Ref Range Status  08/30/2021 09:00 AM 5.6 4.8 - 5.6 % Final    Comment:             Prediabetes: 5.7 - 6.4  Diabetes: >6.4          Glycemic control for adults with diabetes: <7.0               **Please note reference interval change**   06/01/2021 09:49 AM 5.6 <7.0 % Final    Comment:                                          Diabetic Adult            <7.0                                       Healthy Adult        4.3 - 5.7                                                           (DCCT/NGSP) American Diabetes Association's Summary of Glycemic Recommendations for Adults with Diabetes: Hemoglobin A1c <7.0%. More stringent glycemic goals (A1c <6.0%) may further reduce complications at the cost of increased risk of hypoglycemia.     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   Surgical History:   Past Surgical History:  Procedure Laterality Date   basilic vein left arm resection     CARDIAC DEFIBRILLATOR PLACEMENT     CORONARY  ARTERY BYPASS GRAFT     6 vessels, 2000   INSERT / REPLACE / REMOVE PACEMAKER  01/05/10   ICD insertion/St. Jude single chamber   RIGHT/LEFT HEART CATH AND CORONARY/GRAFT ANGIOGRAPHY N/A 09/23/2020   Procedure: RIGHT/LEFT HEART CATH AND CORONARY/GRAFT ANGIOGRAPHY;  Surgeon: Sherren Mocha, MD;  Location: [facility];  Service: Cardiovascular;  Laterality: N/A;     Social History:   reports that he quit smoking about 33 years ago. His smoking use included cigarettes. He started smoking about 53 years ago. He has never used smokeless tobacco. He reports that he does not drink alcohol and does not use drugs.   Family History:  His family history includes Coronary artery disease in his father; Emphysema in his father; Heart attack in his mother. There is no history of Colon cancer.   Allergies Allergies  Allergen Reactions   Entresto [Sacubitril-Valsartan]     Worsening renal function and weight gain     Home Medications  Prior to Admission medications   Medication Sig Start Date End Date Taking? Authorizing Provider  acetaminophen (TYLENOL) 500 MG tablet Take 500 mg by mouth every 6 (six) hours as needed for pain.   Yes [provider]  albuterol (VENTOLIN HFA) 108 (90 Base) MCG/ACT inhaler Inhale 2 puffs into the lungs every 4 (four) hours as  needed for wheezing or shortness of breath. 09/08/20  Yes Dione Booze, MD  aspirin EC 81 MG tablet Take 81 mg by mouth every evening. Swallow whole.   Yes [provider]  atorvastatin (LIPITOR) 80 MG tablet Take 1 tablet (80 mg total) by mouth every evening. 11/29/20  Yes [name], Elige Radon, MD  enalapril (VASOTEC) 20 MG tablet Take 1 tablet (20 mg total) by mouth 2 (two) times daily. 04/20/21  Yes Strader, [place] M, PA-C  fish oil-omega-3 fatty acids 1000 MG capsule Take 1 g by mouth 3 (three) times daily.   Yes [provider]  hydrOXYzine (VISTARIL) 25 MG capsule Take 1 capsule (25 mg total) by mouth 3 (three)  times daily as needed. 09/13/20  Yes [name], Elige Radon, MD  ipratropium (ATROVENT) 0.02 % nebulizer solution Take 500 mcg by nebulization every 4 (four) hours as needed for shortness of breath. 08/01/21  Yes [provider]  JARDIANCE 10 MG TABS tablet TAKE 1 TABLET BY MOUTH DAILY BEFORE BREAKFAST. 09/05/21  Yes Bensimhon, Bevelyn Buckles, MD  metoprolol succinate (TOPROL-XL) 100 MG 24 hr tablet Take 1 tablet (100 mg total) by mouth daily. Take with or immediately following a meal. 09/14/20 07/29/22 Yes Strader, [place] M, PA-C  Multiple Vitamin (MULTIVITAMIN WITH MINERALS) TABS tablet Take 1 tablet by mouth daily.   Yes [provider]  nitroGLYCERIN (NITROSTAT) 0.4 MG SL tablet Place 1 tablet (0.4 mg total) under the tongue every 5 (five) minutes as needed for chest pain. 05/24/16  Yes Rollene Rotunda, MD  omeprazole (PRILOSEC) 20 MG capsule Take 1 capsule (20 mg total) by mouth daily. 09/02/21  Yes [name], Elige Radon, MD  spironolactone (ALDACTONE) 25 MG tablet Take 0.5 tablets (12.5 mg total) by mouth daily. 02/04/21 07/29/22 Yes Clegg, Amy D, NP  torsemide (DEMADEX) 20 MG tablet Take 1 tablet (20 mg total) by mouth 2 (two) times a week. May take an extra for weight gain or edema 10/03/21 01/01/22 Yes Bensimhon, Bevelyn Buckles, MD  albuterol (PROVENTIL) (2.5 MG/3ML) 0.083% nebulizer solution Take 2.5 mg by nebulization every 4 (four) hours as needed for shortness of breath. 08/11/21   [provider]  doxycycline (VIBRAMYCIN) 100 MG capsule Take 100 mg by mouth 2 (two) times daily. Patient not taking: Reported on 10/07/2021 08/24/21   [provider]  EPINEPHrine 0.3 mg/0.3 mL IJ SOAJ injection Inject into the muscle as directed. Patient not taking: Reported on 10/23/2021 08/09/21   [provider]  predniSONE (STERAPRED UNI-PAK 21 TAB) 10 MG (21) TBPK tablet Use as directed Patient not taking: Reported on 10/02/2021 10/20/2021   Junie Spencer, FNP     Critical care time:    Performed by: Meleana Commerford D. Harris  Total critical care time: 48 minutes  Critical care time was exclusive of separately billable procedures and treating other patients.  Critical care was necessary to treat or prevent imminent or life-threatening deterioration.  Critical care was time spent personally by me on the following activities: development of treatment plan with patient and/or surrogate as well as nursing, discussions with consultants, evaluation of patient's response to treatment, examination of patient, obtaining history from patient or surrogate, ordering and performing treatments and interventions, ordering and review of laboratory studies, ordering and review of radiographic studies, pulse oximetry and re-evaluation of patient's condition.  Georgine Wiltse D. Tiburcio Pea, NP-C [facility] Personal contact information can be found on Amion  10/28/2021, 2:55 PM

## 2021-10-28 NOTE — Anesthesia Preprocedure Evaluation (Signed)
Anesthesia Evaluation  Patient identified by MRN, date of birth, ID band Patient awake    Reviewed: Allergy & Precautions, H&P , NPO status , Patient's Chart, lab work & pertinent test results, reviewed documented beta blocker date and time   Airway Mallampati: II  TM Distance: >3 FB Neck ROM: full    Dental no notable dental hx.    Pulmonary shortness of breath, pneumonia, COPD, former smoker,    Pulmonary exam normal breath sounds clear to auscultation       Cardiovascular Exercise Tolerance: Good hypertension, + CAD and +CHF  + Cardiac Defibrillator  Rhythm:regular Rate:Normal     Neuro/Psych negative neurological ROS  negative psych ROS   GI/Hepatic Neg liver ROS, GERD  ,  Endo/Other  diabetes, Type 2  Renal/GU ARF and CRFRenal disease  negative genitourinary   Musculoskeletal   Abdominal   Peds  Hematology  (+) Blood dyscrasia, anemia ,   Anesthesia Other Findings Called for urgent intubation due to respiratory distress.  Reproductive/Obstetrics negative OB ROS                             Anesthesia Physical Anesthesia Plan  ASA: 4 and emergent  Anesthesia Plan: General   Post-op Pain Management:    Induction:   PONV Risk Score and Plan:   Airway Management Planned:   Additional Equipment:   Intra-op Plan:   Post-operative Plan:   Informed Consent: I have reviewed the patients History and Physical, chart, labs and discussed the procedure including the risks, benefits and alternatives for the proposed anesthesia with the patient or authorized representative who has indicated his/her understanding and acceptance.     Dental Advisory Given  Plan Discussed with: CRNA  Anesthesia Plan Comments:         Anesthesia Quick Evaluation

## 2021-10-29 ENCOUNTER — Inpatient Hospital Stay (HOSPITAL_COMMUNITY): Payer: Medicare HMO

## 2021-10-29 DIAGNOSIS — J9601 Acute respiratory failure with hypoxia: Secondary | ICD-10-CM | POA: Diagnosis not present

## 2021-10-29 DIAGNOSIS — Z951 Presence of aortocoronary bypass graft: Secondary | ICD-10-CM | POA: Diagnosis not present

## 2021-10-29 DIAGNOSIS — J9602 Acute respiratory failure with hypercapnia: Secondary | ICD-10-CM | POA: Diagnosis not present

## 2021-10-29 DIAGNOSIS — I4891 Unspecified atrial fibrillation: Secondary | ICD-10-CM | POA: Diagnosis not present

## 2021-10-29 DIAGNOSIS — I5043 Acute on chronic combined systolic (congestive) and diastolic (congestive) heart failure: Secondary | ICD-10-CM

## 2021-10-29 LAB — CULTURE, BLOOD (ROUTINE X 2)
Culture: NO GROWTH
Culture: NO GROWTH

## 2021-10-29 LAB — POCT I-STAT 7, (LYTES, BLD GAS, ICA,H+H)
Acid-base deficit: 8 mmol/L — ABNORMAL HIGH (ref 0.0–2.0)
Acid-base deficit: 9 mmol/L — ABNORMAL HIGH (ref 0.0–2.0)
Bicarbonate: 18.7 mmol/L — ABNORMAL LOW (ref 20.0–28.0)
Bicarbonate: 17.2 mmol/L — ABNORMAL LOW (ref 20.0–28.0)
Calcium, Ion: 1.13 mmol/L — ABNORMAL LOW (ref 1.15–1.40)
Calcium, Ion: 1.08 mmol/L — ABNORMAL LOW (ref 1.15–1.40)
HCT: 37 % — ABNORMAL LOW (ref 39.0–52.0)
HCT: 39 % (ref 39.0–52.0)
Hemoglobin: 12.6 g/dL — ABNORMAL LOW (ref 13.0–17.0)
Hemoglobin: 13.3 g/dL (ref 13.0–17.0)
O2 Saturation: 98 %
O2 Saturation: 99 %
Patient temperature: 98.1
Patient temperature: 98.4
Potassium: 4.5 mmol/L (ref 3.5–5.1)
Potassium: 4.6 mmol/L (ref 3.5–5.1)
Sodium: 131 mmol/L — ABNORMAL LOW (ref 135–145)
Sodium: 129 mmol/L — ABNORMAL LOW (ref 135–145)
TCO2: 20 mmol/L — ABNORMAL LOW (ref 22–32)
TCO2: 18 mmol/L — ABNORMAL LOW (ref 22–32)
pCO2 arterial: 40.8 mmHg (ref 32.0–48.0)
pCO2 arterial: 37.1 mmHg (ref 32.0–48.0)
pH, Arterial: 7.268 — ABNORMAL LOW (ref 7.350–7.450)
pH, Arterial: 7.274 — ABNORMAL LOW (ref 7.350–7.450)
pO2, Arterial: 111 mmHg — ABNORMAL HIGH (ref 83.0–108.0)
pO2, Arterial: 145 mmHg — ABNORMAL HIGH (ref 83.0–108.0)

## 2021-10-29 LAB — BASIC METABOLIC PANEL
Anion gap: 14 (ref 5–15)
BUN: 74 mg/dL — ABNORMAL HIGH (ref 8–23)
CO2: 19 mmol/L — ABNORMAL LOW (ref 22–32)
Calcium: 8 mg/dL — ABNORMAL LOW (ref 8.9–10.3)
Chloride: 100 mmol/L (ref 98–111)
Creatinine, Ser: 3.31 mg/dL — ABNORMAL HIGH (ref 0.61–1.24)
GFR, Estimated: 20 mL/min — ABNORMAL LOW (ref >60)
Glucose, Bld: 162 mg/dL — ABNORMAL HIGH (ref 70–99)
Potassium: 5 mmol/L (ref 3.5–5.1)
Sodium: 133 mmol/L — ABNORMAL LOW (ref 135–145)

## 2021-10-29 LAB — COOXEMETRY PANEL
Carboxyhemoglobin: 0.2 % — ABNORMAL LOW (ref 0.5–1.5)
Methemoglobin: 1.2 % (ref 0.0–1.5)
O2 Saturation: 81 %
Total hemoglobin: 13.4 g/dL (ref 12.0–16.0)

## 2021-10-29 LAB — TRIGLYCERIDES: Triglycerides: 74 mg/dL (ref <150)

## 2021-10-29 LAB — MAGNESIUM
Magnesium: 2.5 mg/dL — ABNORMAL HIGH (ref 1.7–2.4)
Magnesium: 2.8 mg/dL — ABNORMAL HIGH (ref 1.7–2.4)

## 2021-10-29 LAB — GLUCOSE, CAPILLARY
Glucose-Capillary: 170 mg/dL — ABNORMAL HIGH (ref 70–99)
Glucose-Capillary: 173 mg/dL — ABNORMAL HIGH (ref 70–99)
Glucose-Capillary: 175 mg/dL — ABNORMAL HIGH (ref 70–99)
Glucose-Capillary: 269 mg/dL — ABNORMAL HIGH (ref 70–99)
Glucose-Capillary: 307 mg/dL — ABNORMAL HIGH (ref 70–99)
Glucose-Capillary: 311 mg/dL — ABNORMAL HIGH (ref 70–99)

## 2021-10-29 LAB — CBC
HCT: 39.2 % (ref 39.0–52.0)
Hemoglobin: 13.1 g/dL (ref 13.0–17.0)
MCH: 28.9 pg (ref 26.0–34.0)
MCHC: 33.4 g/dL (ref 30.0–36.0)
MCV: 86.5 fL (ref 80.0–100.0)
Platelets: 137 10*3/uL — ABNORMAL LOW (ref 150–400)
RBC: 4.53 MIL/uL (ref 4.22–5.81)
RDW: 13.3 % (ref 11.5–15.5)
WBC: 13.7 10*3/uL — ABNORMAL HIGH (ref 4.0–10.5)
nRBC: 0 % (ref 0.0–0.2)

## 2021-10-29 LAB — PHOSPHORUS
Phosphorus: 6.6 mg/dL — ABNORMAL HIGH (ref 2.5–4.6)
Phosphorus: 7.4 mg/dL — ABNORMAL HIGH (ref 2.5–4.6)

## 2021-10-29 MED ORDER — AMIODARONE HCL IN DEXTROSE 360-4.14 MG/200ML-% IV SOLN
30.0000 mg/h | INTRAVENOUS | Status: AC
  Administered 2021-10-29 – 2021-11-06 (×19): 30 mg/h via INTRAVENOUS
  Filled 2021-10-29 (×19): qty 200

## 2021-10-29 MED ORDER — ACETAMINOPHEN 650 MG RE SUPP: 650.0000 mg | Freq: Four times a day (QID) | RECTAL | Status: DC | PRN

## 2021-10-29 MED ORDER — OSELTAMIVIR PHOSPHATE 6 MG/ML PO SUSR
30.0000 mg | Freq: Once | ORAL | Status: AC
  Administered 2021-10-29: 30 mg
  Filled 2021-10-29: qty 12.5

## 2021-10-29 MED ORDER — POLYETHYLENE GLYCOL 3350 17 G PO PACK
17.0000 g | PACK | Freq: Every day | ORAL | Status: DC | PRN
  Administered 2021-11-06: 17 g
  Filled 2021-10-29: qty 1

## 2021-10-29 MED ORDER — METOLAZONE 5 MG PO TABS
5.0000 mg | ORAL_TABLET | Freq: Once | ORAL | Status: AC
  Administered 2021-10-29: 5 mg
  Filled 2021-10-29: qty 1

## 2021-10-29 MED ORDER — POLYETHYLENE GLYCOL 3350 17 G PO PACK: 17.0000 g | PACK | Freq: Every day | ORAL | Status: DC

## 2021-10-29 MED ORDER — NOREPINEPHRINE 16 MG/250ML-% IV SOLN
0.0000 ug/min | INTRAVENOUS | Status: DC
  Administered 2021-10-29: 35 ug/min via INTRAVENOUS
  Administered 2021-10-29: 23:00:00 34 ug/min via INTRAVENOUS
  Administered 2021-10-30: 11:00:00 18 ug/min via INTRAVENOUS
  Administered 2021-10-31: 19:00:00 12 ug/min via INTRAVENOUS
  Administered 2021-10-31: 15 ug/min via INTRAVENOUS
  Administered 2021-11-01: 17:00:00 22 ug/min via INTRAVENOUS
  Administered 2021-11-01: 03:00:00 19 ug/min via INTRAVENOUS
  Administered 2021-11-02: 03:00:00 10 ug/min via INTRAVENOUS
  Administered 2021-11-02: 27 ug/min via INTRAVENOUS
  Administered 2021-11-04: 12 ug/min via INTRAVENOUS
  Administered 2021-11-04: 2.5 ug/min via INTRAVENOUS
  Filled 2021-10-29 (×10): qty 250

## 2021-10-29 MED ORDER — VANCOMYCIN VARIABLE DOSE PER UNSTABLE RENAL FUNCTION (PHARMACIST DOSING)
Status: DC
Start: 2021-10-30 — End: 2021-10-29

## 2021-10-29 MED ORDER — HEPARIN (PORCINE) 25000 UT/250ML-% IV SOLN
1000.0000 [IU]/h | INTRAVENOUS | Status: DC
  Administered 2021-10-29: 1000 [IU]/h via INTRAVENOUS
  Filled 2021-10-29: qty 250

## 2021-10-29 MED ORDER — AMIODARONE LOAD VIA INFUSION
150.0000 mg | Freq: Once | INTRAVENOUS | Status: AC
  Administered 2021-10-29: 150 mg via INTRAVENOUS
  Filled 2021-10-29: qty 83.34

## 2021-10-29 MED ORDER — VASOPRESSIN 20 UNITS/100 ML INFUSION FOR SHOCK
0.0000 [IU]/min | INTRAVENOUS | Status: DC
  Administered 2021-10-29 – 2021-10-31 (×5): 0.04 [IU]/min via INTRAVENOUS
  Administered 2021-10-31 – 2021-11-02 (×5): 0.03 [IU]/min via INTRAVENOUS
  Filled 2021-10-29 (×10): qty 100

## 2021-10-29 MED ORDER — ACETAMINOPHEN 325 MG PO TABS
650.0000 mg | ORAL_TABLET | Freq: Four times a day (QID) | ORAL | Status: DC | PRN
  Administered 2021-11-06 – 2021-11-14 (×4): 650 mg
  Filled 2021-10-29 (×4): qty 2

## 2021-10-29 MED ORDER — PANTOPRAZOLE 2 MG/ML SUSPENSION
40.0000 mg | Freq: Every day | ORAL | Status: DC
  Administered 2021-10-29 – 2021-11-14 (×17): 40 mg
  Filled 2021-10-29 (×16): qty 20

## 2021-10-29 MED ORDER — DOCUSATE SODIUM 50 MG/5ML PO LIQD: 100.0000 mg | Freq: Two times a day (BID) | ORAL | Status: DC

## 2021-10-29 MED ORDER — FENTANYL CITRATE (PF) 2500 MCG/50ML IJ SOLN
25.0000 ug/h | Status: DC
  Administered 2021-10-29 (×2): 75 ug/h via INTRAVENOUS
  Administered 2021-10-30: 12:00:00 150 ug/h via INTRAVENOUS
  Administered 2021-11-01: 100 ug/h via INTRAVENOUS
  Administered 2021-11-02: 175 ug/h via INTRAVENOUS
  Administered 2021-11-03: 150 ug/h via INTRAVENOUS
  Administered 2021-11-04: 175 ug/h via INTRAVENOUS
  Administered 2021-11-05: 150 ug/h via INTRAVENOUS
  Administered 2021-11-06: 175 ug/h via INTRAVENOUS
  Administered 2021-11-08: 50 ug/h via INTRAVENOUS
  Filled 2021-10-29 (×3): qty 100
  Filled 2021-10-29: qty 50
  Filled 2021-10-29 (×8): qty 100

## 2021-10-29 MED ORDER — ASPIRIN 81 MG PO CHEW
81.0000 mg | CHEWABLE_TABLET | Freq: Every day | ORAL | Status: DC
  Administered 2021-10-29 – 2021-11-14 (×17): 81 mg
  Filled 2021-10-29 (×18): qty 1

## 2021-10-29 MED ORDER — AMIODARONE HCL IN DEXTROSE 360-4.14 MG/200ML-% IV SOLN
60.0000 mg/h | INTRAVENOUS | Status: AC
  Administered 2021-10-29 (×2): 60 mg/h via INTRAVENOUS
  Filled 2021-10-29 (×2): qty 200

## 2021-10-29 MED ORDER — NOREPINEPHRINE 4 MG/250ML-% IV SOLN
0.0000 ug/min | INTRAVENOUS | Status: DC
  Administered 2021-10-29: 5 ug/min via INTRAVENOUS

## 2021-10-29 MED ORDER — HYDROCORTISONE SOD SUC (PF) 100 MG IJ SOLR
100.0000 mg | Freq: Three times a day (TID) | INTRAMUSCULAR | Status: DC
Start: 2021-10-29 — End: 2021-11-01
  Administered 2021-10-29 – 2021-11-01 (×8): 100 mg via INTRAVENOUS
  Filled 2021-10-29 (×8): qty 2

## 2021-10-29 MED ORDER — VITAL AF 1.2 CAL PO LIQD
1000.0000 mL | ORAL | Status: DC
  Administered 2021-10-29 – 2021-10-30 (×2): 1000 mL

## 2021-10-29 MED ORDER — SODIUM BICARBONATE 650 MG PO TABS
650.0000 mg | ORAL_TABLET | Freq: Three times a day (TID) | ORAL | Status: DC
  Administered 2021-10-29 – 2021-11-05 (×23): 650 mg
  Filled 2021-10-29 (×24): qty 1

## 2021-10-29 MED ORDER — PROPOFOL 1000 MG/100ML IV EMUL
0.0000 ug/kg/min | INTRAVENOUS | Status: DC
  Administered 2021-10-29: 15 ug/kg/min via INTRAVENOUS
  Administered 2021-10-29: 10 ug/kg/min via INTRAVENOUS
  Administered 2021-10-29 – 2021-10-30 (×2): 25 ug/kg/min via INTRAVENOUS
  Administered 2021-10-30: 16:00:00 15 ug/kg/min via INTRAVENOUS
  Administered 2021-10-31: 20 ug/kg/min via INTRAVENOUS
  Administered 2021-10-31 (×3): 30 ug/kg/min via INTRAVENOUS
  Administered 2021-11-01 (×2): 40 ug/kg/min via INTRAVENOUS
  Administered 2021-11-01: 30 ug/kg/min via INTRAVENOUS
  Administered 2021-11-01: 40 ug/kg/min via INTRAVENOUS
  Administered 2021-11-01: 30 ug/kg/min via INTRAVENOUS
  Administered 2021-11-02: 40 ug/kg/min via INTRAVENOUS
  Administered 2021-11-02: 30 ug/kg/min via INTRAVENOUS
  Administered 2021-11-02 – 2021-11-03 (×2): 35 ug/kg/min via INTRAVENOUS
  Administered 2021-11-03 (×3): 25 ug/kg/min via INTRAVENOUS
  Administered 2021-11-04: 20 ug/kg/min via INTRAVENOUS
  Administered 2021-11-04: 30 ug/kg/min via INTRAVENOUS
  Filled 2021-10-29 (×3): qty 100
  Filled 2021-10-29: qty 200
  Filled 2021-10-29 (×4): qty 100
  Filled 2021-10-29: qty 200
  Filled 2021-10-29 (×13): qty 100

## 2021-10-29 MED ORDER — CALCIUM GLUCONATE-NACL 2-0.675 GM/100ML-% IV SOLN
2.0000 g | Freq: Once | INTRAVENOUS | Status: AC
  Administered 2021-10-29: 2000 mg via INTRAVENOUS
  Filled 2021-10-29: qty 100

## 2021-10-29 MED ORDER — INSULIN GLARGINE-YFGN 100 UNIT/ML ~~LOC~~ SOLN
5.0000 [IU] | Freq: Every day | SUBCUTANEOUS | Status: DC
  Administered 2021-10-29 – 2021-10-30 (×2): 5 [IU] via SUBCUTANEOUS
  Filled 2021-10-29 (×3): qty 0.05

## 2021-10-29 MED ORDER — PROSOURCE TF PO LIQD
45.0000 mL | Freq: Three times a day (TID) | ORAL | Status: DC
  Administered 2021-10-29 – 2021-10-31 (×6): 45 mL
  Filled 2021-10-29 (×6): qty 45

## 2021-10-29 MED ORDER — FUROSEMIDE 10 MG/ML IJ SOLN
120.0000 mg | Freq: Once | INTRAVENOUS | Status: AC
  Administered 2021-10-29: 120 mg via INTRAVENOUS
  Filled 2021-10-29: qty 10

## 2021-10-29 MED ORDER — ATORVASTATIN CALCIUM 80 MG PO TABS
80.0000 mg | ORAL_TABLET | Freq: Every evening | ORAL | Status: DC
  Administered 2021-10-29 – 2021-11-13 (×16): 80 mg
  Filled 2021-10-29 (×16): qty 1

## 2021-10-29 NOTE — Progress Notes (Signed)
eLink Physician-Brief Progress Note Patient Name: Joseph [name] DOB: Mar 09, 1956 MRN: [id number]   Date of Service  10/29/2021  HPI/Events of Note  CBG's running high, 269-307on steroids  eICU Interventions  Just on q4 SSI.  Ordered 5 units of long acting insuline at night for glucose goal < 180.      Intervention Category Intermediate Interventions: Hyperglycemia - evaluation and treatment  Ranee Gosselin 10/29/2021, 7:34 PM

## 2021-10-29 NOTE — Procedures (Signed)
Arterial Catheter Insertion Procedure Note  Joseph [name]  [id number]  Jun 07, 1956  Date:10/29/21  Time:2:27 PM    Provider Performing: Rosalita Levan    Procedure: Insertion of Arterial Line (77412) without US guidance  Indication(s) Blood pressure monitoring and/or need for frequent ABGs  Consent Unable to obtain consent due to inability to find a medical decision maker for patient.  All reasonable efforts were made.  Another independent medical provider, Dr. Thora Lance , confirmed the benefits of this procedure outweigh the risks.  Anesthesia None   Time Out Verified patient identification, verified procedure, site/side was marked, verified correct patient position, special equipment/implants available, medications/allergies/relevant history reviewed, required imaging and test results available.   Sterile Technique Maximal sterile technique including full sterile barrier drape, hand hygiene, sterile gown, sterile gloves, mask, hair covering, sterile ultrasound probe cover (if used).   Procedure Description Area of catheter insertion was cleaned with chlorhexidine and draped in sterile fashion. Without real-time ultrasound guidance an arterial catheter was placed into the left radial artery.  Appropriate arterial tracings confirmed on monitor.     Complications/Tolerance None; patient tolerated the procedure well.   EBL Minimal   Specimen(s) None

## 2021-10-29 NOTE — Progress Notes (Signed)
Progress Note  Patient Name: SIMEON VERA Date of Encounter: 10/29/2021  [facility] Cardiologist: Minus Breeding, MD    Subjective    65 yo with multifocal pneumonia and influenza, atrial fibrillation/flutter, coronary artery disease, chronic combined systolic and diastolic congestive heart failure  He was transferred from independent hospital for worsening respiratory distress.  He was intubated at [facility] and was transferred to [facility].  He is now being seen in [place]. Continues to have worsening renal failure.  His creatinine is 3.3.  Troponin levels are still elevated but the trend has been gradually declining.  This is consistent with demand ischemia and not consistent with an acute coronary syndrome.  He is still in NSR, is intubated, sedated.  Discussed with family at the bedside  Inpatient Medications    Scheduled Meds:  arformoterol  15 mcg Nebulization BID   aspirin  81 mg Per Tube Daily   atorvastatin  80 mg Per Tube QPM   budesonide (PULMICORT) nebulizer solution  0.5 mg Nebulization BID   chlorhexidine gluconate (MEDLINE KIT)  15 mL Mouth Rinse BID   Chlorhexidine Gluconate Cloth  6 each Topical Daily   docusate  100 mg Per Tube BID   feeding supplement (PROSource TF)  45 mL Per Tube TID   insulin aspart  0-15 Units Subcutaneous Q4H   ipratropium  0.5 mg Nebulization TID   mouth rinse  15 mL Mouth Rinse 10 times per day   methylPREDNISolone (SOLU-MEDROL) injection  60 mg Intravenous Daily   pantoprazole sodium  40 mg Per Tube Daily   polyethylene glycol  17 g Per Tube Daily   Continuous Infusions:  sodium chloride 10 mL/hr at 10/29/21 1200   amiodarone 60 mg/hr (10/29/21 1200)   Followed by   amiodarone     ceFEPime (MAXIPIME) IV     feeding supplement (VITAL AF 1.2 CAL) 1,000 mL (10/29/21 1159)   fentaNYL infusion INTRAVENOUS     heparin     promethazine (PHENERGAN) injection (IM or IVPB)     propofol (DIPRIVAN) infusion 25 mcg/kg/min (10/29/21  1200)   PRN Meds: acetaminophen **OR** acetaminophen, albuterol, fentaNYL, levalbuterol, polyethylene glycol, promethazine (PHENERGAN) injection (IM or IVPB)   Vital Signs    Vitals:   10/29/21 1116 10/29/21 1130 10/29/21 1145 10/29/21 1200  BP:  111/71 (!) 128/108 (!) 86/66  Pulse:  72 71 76  Resp:  14 11 (!) 9  Temp: 98.1 F (36.7 C)     TempSrc: Oral     SpO2:  100% 99% 100%  Weight:      Height:        Intake/Output Summary (Last 24 hours) at 10/29/2021 1237 Last data filed at 10/29/2021 1200 Gross per 24 hour  Intake 1962.76 ml  Output 780 ml  Net 1182.76 ml   Last 3 Weights 10/29/2021 10/25/2021 10/04/2021  Weight (lbs) 185 lb 3 oz 177 lb 7.5 oz 177 lb  Weight (kg) 84 kg 80.5 kg 80.287 kg      Telemetry    NSR  - Personally Reviewed  ECG     - Personally Reviewed  Physical Exam   GEN: middle age man.  On the vent   Neck: No JVD Cardiac: RRR, no murmurs, rubs, or gallops.  Respiratory: Clear to auscultation bilaterally. GI: Soft, nontender, non-distended  MS: No edema; No deformity. Neuro:  Nonfocal  Psych: Normal affect   Labs    High Sensitivity Troponin:   Recent Labs  Lab 10/25/21  0708 10/25/21 1945 10/26/21 0140 10/26/21 0655 10/28/21 0929  TROPONINIHS 365* 281* 396* 305* 118*     Chemistry Recent Labs  Lab 09/29/2021 1828 10/01/2021 2306 10/25/21 1945 10/26/21 0140 10/27/21 0432 10/28/21 0416 10/28/21 1636 10/28/21 1709 10/29/21 0514  NA 134*   < > 132*   < > 135 134*  --  131* 133*  K 4.3   < > 5.1   < > 4.0 4.6  --  4.7 5.0  CL 103   < > 104   < > 105 105  --   --  100  CO2 17*   < > 14*   < > 18* 15*  --   --  19*  GLUCOSE 199*   < > 386*   < > 136* 193*  --   --  162*  BUN 33*   < > 39*   < > 51* 55*  --   --  74*  CREATININE 2.01*   < > 2.30*   < > 1.98* 2.60*  --   --  3.31*  CALCIUM 9.2   < > 8.1*   < > 8.3* 8.2*  --   --  8.0*  MG  --    < > 2.3   < > 2.3  --  2.5*  --  2.5*  PROT 8.3*  --  6.7  --   --   --   --   --    --   ALBUMIN 4.1  --  3.0*  --   --   --   --   --   --   AST 40  --  56*  --   --   --   --   --   --   ALT 36  --  46*  --   --   --   --   --   --   ALKPHOS 121  --  103  --   --   --   --   --   --   BILITOT 0.7  --  0.4  --   --   --   --   --   --   GFRNONAA 36*   < > 30*   < > 36* 26*  --   --  20*  ANIONGAP 14   < > 14   < > 12 14  --   --  14   < > = values in this interval not displayed.    Lipids  Recent Labs  Lab 10/29/21 0514  TRIG 74    Hematology Recent Labs  Lab 10/27/21 0432 10/28/21 0416 10/28/21 1709 10/29/21 0514  WBC 17.1* 28.4*  --  13.7*  RBC 4.81 5.27  --  4.53  HGB 14.1 15.7 13.3 13.1  HCT 42.6 48.1 39.0 39.2  MCV 88.6 91.3  --  86.5  MCH 29.3 29.8  --  28.9  MCHC 33.1 32.6  --  33.4  RDW 13.5 13.7  --  13.3  PLT 203 297  --  137*   Thyroid No results for input(s): TSH, FREET4 in the last 168 hours.  BNP Recent Labs  Lab 10/04/2021 1909 10/25/21 1945 10/28/21 1746  BNP 243.0* 383.0* 377.2*    DDimer  Recent Labs  Lab 10/25/21 1945  DDIMER 2.30*     Radiology    DG CHEST PORT 1 VIEW  Result Date: 10/29/2021 CLINICAL DATA:  Acute respiratory failure  EXAM: PORTABLE CHEST 1 VIEW COMPARISON:  10/28/2021 FINDINGS: Endotracheal and enteric tubes are again identified. Persistent bilateral pulmonary opacities with perihilar and lower lung predominance. Aeration is not substantially changed. Stable cardiomediastinal contours. Left chest wall ICD. Probable small left pleural effusion remains present. IMPRESSION: Persistent bilateral pulmonary opacities likely reflecting edema and/or pneumonia. Aeration is similar. Probable small left pleural effusion. Electronically Signed   By: Macy Mis M.D.   On: 10/29/2021 09:42   Portable Chest x-ray  Result Date: 10/28/2021 CLINICAL DATA:  65 year old male, assess ET tube placement. EXAM: PORTABLE CHEST - 1 VIEW COMPARISON:  Earlier the same day FINDINGS: The mediastinal contours are within normal  limits. Similar appearing postsurgical changes after coronary artery bypass graft. Unchanged position of endotracheal tube distal tip in the midthoracic trachea. Gastric decompression tube tip terminates off the inferior aspect of this image. Unchanged appearance of left chest wall single lead pacemaker/AICD. No cardiomegaly. Similar appearing diffuse basal predominant patchy pulmonary opacities. Likely trace bilateral pleural effusions. No pneumothorax. No acute osseous abnormality. IMPRESSION: 1. Unchanged, appropriate position of endotracheal tube. 2. Unchanged basal predominant diffuse pulmonary opacities. Electronically Signed   By: Ruthann Cancer M.D.   On: 10/28/2021 16:48   DG CHEST PORT 1 VIEW  Result Date: 10/28/2021 CLINICAL DATA:  Respiratory distress. EXAM: PORTABLE CHEST 1 VIEW COMPARISON:  Same day. FINDINGS: Stable cardiomediastinal silhouette. Status post coronary bypass graft. Endotracheal tube appears to be in grossly good position. Stable position of left-sided pacemaker is noted. Stable bilateral lung opacities are noted. Bony thorax is unremarkable. IMPRESSION: Endotracheal tube in grossly good position. Stable bilateral lung opacities. Electronically Signed   By: Marijo Conception M.D.   On: 10/28/2021 10:36   DG Chest Port 1 View  Result Date: 10/28/2021 CLINICAL DATA:  Respiratory distress. EXAM: PORTABLE CHEST 1 VIEW COMPARISON:  October 27, 2021 (10:13 a.m.) FINDINGS: Multiple sternal wires and vascular clips are seen. A single lead ventricular pacer is noted. Marked severity bilateral multifocal airspace disease is seen. This is increased in severity when compared to the prior study and slightly more prominent within the bilateral lower lobes. Mild layering bilateral pleural effusions are seen. No pneumothorax is identified. The heart size and mediastinal contours are within normal limits. The visualized skeletal structures are unremarkable. IMPRESSION: 1. Marked severity bilateral  multifocal airspace disease, increased in severity when compared to the prior study. 2. Stable, partially layering bilateral pleural effusions. Electronically Signed   By: Virgina Norfolk M.D.   On: 10/28/2021 00:35   DG Abd Portable 1V  Result Date: 10/28/2021 CLINICAL DATA:  OG tube placement EXAM: PORTABLE ABDOMEN - 1 VIEW COMPARISON:  Concurrently obtained chest x-ray; prior abdominal radiograph from earlier today at 1:16 p.m. FINDINGS: A gastric tube has been placed. The tip of the tube overlies the left upper quadrant presumably within the stomach. Multifocal opacities throughout the visualized lungs. Patient is status post median sternotomy with evidence of prior multivessel CABG. An intracardiac defibrillator lead projects over the right heart. IMPRESSION: Successful placement of gastric tube which overlies the stomach. Electronically Signed   By: Jacqulynn Cadet M.D.   On: 10/28/2021 16:43   DG Abd Portable 1V  Result Date: 10/28/2021 CLINICAL DATA:  Orogastric tube placement EXAM: PORTABLE ABDOMEN - 1 VIEW COMPARISON:  CT abdomen 03/12/2010 FINDINGS: No orogastric tube is identified over the upper abdomen. Because of this, we image the chest, and the orogastric tube is not visible in the chest either. There is a endotracheal tube  which sits about 6.7 cm above the carina. AICD noted. Prior CABG. Patchy bilateral perihilar airspace opacities. Please note that today's image of the chest with shot with parameters favoring visualization of abdominal structures and is not a dedicated chest radiograph. Upper abdominal bowel gas pattern unremarkable. Lower lumbar spondylosis. IMPRESSION: 1. No orogastric tube is visible over the chest or abdomen. 2. Substantial bilateral perihilar airspace opacities in the chest. Electronically Signed   By: Van Clines M.D.   On: 10/28/2021 15:56    Cardiac Studies      Patient Profile     65 y.o. male  with hx of CABG , chronic combined CHF, admitted to  [facility] with respiratory failure due to flu and pneumonia. Complicated by acute renal insufficiency   Assessment & Plan      Atrial fib:   has converted back to sinus . This is likely due to his pneumonia. Cont amio drip for now. Cont lovenox for now   2.  CAD :   hx of CABG  3.  Chronic combined CHF :  continue supportive care.   We are limited in our medical therapy by his worsening renal insufficiency . Cont lasix for now         For questions or updates, please contact [place] Please consult [email or web address] for contact info under        Signed, Mertie Moores, MD  10/29/2021, 12:37 PM

## 2021-10-29 NOTE — Progress Notes (Signed)
eLink Physician-Brief Progress Note Patient Name: Joseph [name] DOB: 1956/01/06 MRN: [id number]   Date of Service  10/29/2021  HPI/Events of Note  Camera evaluation: RT discussion.  On PCV, not pulling much Ve fluctuating. Recent Abg on PCV, stable respiratory and metabolic acidosis. On sedation.   eICU Interventions  Agree to go back on [facility] 600/8/15 as before in day time for tonight. Follow ABG.      Intervention Category Intermediate Interventions: Respiratory distress - evaluation and management  Ranee Gosselin 10/29/2021, 11:23 PM

## 2021-10-29 NOTE — Progress Notes (Signed)
ANTICOAGULATION CONSULT NOTE - Follow Up Consult  Pharmacy Consult for Heparin drip Indication: atrial fibrillation  Allergies  Allergen Reactions   Entresto [Sacubitril-Valsartan]     Worsening renal function and weight gain    Patient Measurements: Height: 5\' 11"  (180.3 cm) Weight: 84 kg (185 lb 3 oz) IBW/kg (Calculated) : 75.3 Heparin Dosing Weight: 80.3 kg   Vital Signs: Temp: 97.7 F (36.5 C) (12/03 0716) Temp Source: Oral (12/03 0716) BP: 113/67 (12/03 0600) Pulse Rate: 62 (12/03 0600)  Labs: Recent Labs    10/27/21 0432 10/28/21 0416 10/28/21 0929 10/28/21 1709 10/29/21 0514  HGB 14.1 15.7  --  13.3 13.1  HCT 42.6 48.1  --  39.0 39.2  PLT 203 297  --   --  137*  CREATININE 1.98* 2.60*  --   --  3.31*  TROPONINIHS  --   --  118*  --   --     Estimated Creatinine Clearance: 23.1 mL/min (A) (by C-G formula based on SCr of 3.31 mg/dL (H)).   Medications:  Infusions:   sodium chloride 10 mL/hr at 10/29/21 0600   amiodarone     Followed by   amiodarone     ceFEPime (MAXIPIME) IV     fentaNYL infusion INTRAVENOUS 50 mcg/hr (10/29/21 0600)   furosemide     heparin     promethazine (PHENERGAN) injection (IM or IVPB)     propofol (DIPRIVAN) infusion      Assessment: 65 year old male with chronic systolic heart failure s/p ICD, s/p CABG, COPD admitted for acute hypoxemic respiratory failure secondary to influenza A. Anticoagulation for new onset atrial fibrillation.  Last dose of enoxaparin 12/2 @2200  No documented signs/symptoms of bleed Continues in afib  Goal of Therapy:  Heparin level 0.3-0.7 units/ml Monitor platelets by anticoagulation protocol: Yes   Plan:  Transitioning patient from enox 1mg /kg q24hr to heparin drip Will start heparin drip 24hr post last dose of enoxaparin at 2200 Heparin drip 1000 units/hr Heparin level @0500  Daily heparin level and CBC ordered  Thank you for allowing pharmacy to be a part of this patient's care.  14/2, PharmD Clinical Pharmacist  Please check AMION for all [facility] Pharmacy numbers After 10:00 PM, call Main Pharmacy [phone]

## 2021-10-29 NOTE — Progress Notes (Addendum)
Nutrition Follow-up  DOCUMENTATION CODES:   Not applicable  INTERVENTION:  Initiate TF with Vital AF 1.2 formula at goal rate of 50 ml/h (1200 ml per day)  Prosource TF 45 ml TID per tube.   Tube feeding regimen with current propofol rate to provide 1956 kcals, 123 gm protein, 972 ml free water daily.  NUTRITION DIAGNOSIS:   Inadequate oral intake related to inability to eat as evidenced by NPO status; ongoing  GOAL:   Patient will meet greater than or equal to 90% of their needs; to be met with TF  MONITOR:   Vent status, TF tolerance, Skin, Weight trends, Labs, I & O's  REASON FOR ASSESSMENT:   Consult, Ventilator Enteral/tube feeding initiation and management  ASSESSMENT:   Patient is a 65 yo male with hx of CHF, COPD, CAD, CKD-3a who presented with acute respiratory distress, positive for influenza A and AKI.  Patient is currently intubated on ventilator support MV: 8.6 L/min Temp (24hrs), Avg:97.6 F (36.4 C), Min:96.7 F (35.9 C), Max:98.4 F (36.9 C)  Propofol: 15 ml/hr which provides 396 kcal/day  RD consulted for tube feeding initiation and management. RD to order tube feeds. Unable to complete Nutrition-Focused physical exam at this time.   Labs and medications reviewed.  Diet Order:   Diet Order             Diet NPO time specified  Diet effective now                   EDUCATION NEEDS:   Not appropriate for education at this time  Skin:  Skin Assessment: Reviewed RN Assessment Skin Integrity Issues:: Stage II, Stage I Stage I: coccyx Stage II: nose due to BiPap  Last BM:  11/28  Height:   Ht Readings from Last 1 Encounters:  10/28/21 5' 11" (1.803 m)    Weight:   Wt Readings from Last 1 Encounters:  10/29/21 84 kg   BMI:  Body mass index is 25.83 kg/m.  Estimated Nutritional Needs:   Kcal:  1800-2000  Protein:  113-130 gr  Fluid:  2 liters daily  Stephanie Craig, MS, RD, LDN RD pager number/after hours weekend  pager number on Amion.  

## 2021-10-29 NOTE — Procedures (Signed)
Central Venous Catheter Insertion Procedure Note  Joseph [name]  [id number]  1956/06/17  Date:10/29/21  Time:3:31 PM   Provider Performing:Jahnavi Muratore Judie Petit Thora Lance   Procedure: Insertion of Non-tunneled Central Venous [phone]) with US guidance ([id number])      Indication(s) Medication administration  Consent Risks of the procedure as well as the alternatives and risks of each were explained to the patient and/or caregiver.  Consent for the procedure was obtained and is signed in the bedside chart  Anesthesia Topical only with 1% lidocaine   Timeout Verified patient identification, verified procedure, site/side was marked, verified correct patient position, special equipment/implants available, medications/allergies/relevant history reviewed, required imaging and test results available.  Sterile Technique Maximal sterile technique including full sterile barrier drape, hand hygiene, sterile gown, sterile gloves, mask, hair covering, sterile ultrasound probe cover (if used).  Procedure Description Area of catheter insertion was cleaned with chlorhexidine and draped in sterile fashion.  With real-time ultrasound guidance a central venous catheter was placed into the left internal jugular vein. Nonpulsatile blood flow and easy flushing noted in all ports.  The catheter was sutured in place and sterile dressing applied.  Complications/Tolerance None; patient tolerated the procedure well. Chest X-ray is ordered to verify placement for internal jugular or subclavian cannulation.   Chest x-ray is not ordered for femoral cannulation.  EBL Minimal  Specimen(s) None

## 2021-10-29 NOTE — Progress Notes (Signed)
NAME:  Joseph [name], MRN:  [id number], DOB:  Apr 13, 1956, LOS: 5 ADMISSION DATE:  10/06/2021, CONSULTATION DATE:  10/28/2021 REFERRING MD:  Dr. Carles Collet, CHIEF COMPLAINT:  Acute hypoxic respiratory failure    History of Present Illness:  Joseph [name] is a 65 y.o. male with a PMH significant for CHF, COPD, CAD s/p CABG, HTN, HLD, MRSA bacteremia, and prior GI bleed who presented to the ED for acute respiratory distress.   On ED arrival patient was placed on BIPAP for significant hypoxia. Workup revealed patient was Flu A positive with underlying COPD exacerbation and likely CHF exacerbation with component of pulmonary edema as well. He was treated with IV diuretics, Tamaflu, CAP coverage, Amiodarone drip with beta blocker for persistent A-fib, and steroids with limited improvement in respiratory status leading to the decision to intubated and transfer to [facility] for further pulmonary and cardiac care   Pertinent  Medical History  CHF, COPD, CAD s/p CABG, HTN, HLD, MRSA bacteremia, and prior GI bleed  Significant Hospital Events: Including procedures, antibiotic start and stop dates in addition to other pertinent events   11/28 admitted at [facility] for acute hypoxic respiratory failure  12/2 intubated and [facility]   Interim History / Subjective:  Net positive one liter, relatively oliguric with lasix IV 40 BID yesterday.   Objective   Blood pressure 113/67, pulse 62, temperature 97.7 F (36.5 C), temperature source Oral, resp. rate 15, height 5\' 11"  (1.803 m), weight 84 kg, SpO2 95 %.    Vent Mode: PRVC FiO2 (%):  [40 %-100 %] 40 % Set Rate:  [15 bmp-18 bmp] 15 bmp Vt Set:  [600 mL] 600 mL PEEP:  [5 cmH20] 5 cmH20 Plateau Pressure:  [18 cmH20-22 cmH20] 22 cmH20   Intake/Output Summary (Last 24 hours) at 10/29/2021 0723 Last data filed at 10/29/2021 0600 Gross per 24 hour  Intake 1873.41 ml  Output 780 ml  Net 1093.41 ml   Filed Weights   10/25/2021 1935 10/25/21 0043  10/29/21 0427  Weight: 80.3 kg 80.5 kg 84 kg    Examination: General appearance: 65 y.o., male, NAD, conversant  Eyes: anicteric sclerae; PERRL, tracking appropriately HENT: NCAT; MMM Neck: Trachea midline; no lymphadenopathy, no JVD Lungs: +bilateral expiratory wheeze, with normal respiratory effort CV: RRR, no murmur  Abdomen: Soft, non-tender; non-distended, BS present  Extremities: No peripheral edema, warm Skin: Normal turgor and texture; no rash Psych: Appropriate affect Neuro: Alert and oriented to person and place, no focal deficit   S Cr rising   Resolved Hospital Problem list     Assessment & Plan:  Acute Hypoxic and Hypercapnic Respiratory Failure  -Likely multifactorial to include influenza A, COPD and CHF exacerbation  Flu A positive  Acute COPD exacerbation  P: Lung protective ventilation Follow cultures  VAP bundle in place  PAD protocol Vanc/cefepime, narrow as able Finishes course of tamiflu Continue BDs IV steroids for at least another day RASS -2  Hx of HFrEF with ischemic cardiomyopathy  -EF 35-40% Persistent A-Flutter/A-fib with RVR -CHA2DS2-VASc score 4 Elevated troponin with hx of CAD -s/p CABG 2000 P: Cardiology following  Heparin gtt amiodarone hold Beta Blocker as we assess response to adding sedation  Heart health diet with sodium restriction when able  Strict intake and output  Daily weight to assess volume status Daily assessment for need to diurese with the use of IV lasix   Closely monitor renal function and electrolytes    Acute Kidney Injury  -  Baseline creatinine 1.4-1.6 with admission creatinine 2.01 P: Looks volume overloaded if anything, diuretic challenge today Trend Bmet Avoid nephrotoxins Ensure adequate renal perfusion   Type 2 diabetes  -Home medication included Jardiance, Hgb A1C 5.6 P: SSI CBG goal 140-180  Stage 1 coccyx pressure injury P: Pressure alleviating devices  Local care    Best Practice  (right click and "Reselect all SmartList Selections" daily)   Diet/type: tubefeeds DVT prophylaxis: systemic heparin GI prophylaxis: PPI Lines: N/A Foley:  N/A Code Status:  full code Last date of multidisciplinary goals of care discussion: Pending  Critical care time:   Performed by: Omar Person  Total critical care time: 36 minutes  Critical care time was exclusive of separately billable procedures and treating other patients.  Critical care was necessary to treat or prevent imminent or life-threatening deterioration.  Critical care was time spent personally by me on the following activities: development of treatment plan with patient and/or surrogate as well as nursing, discussions with consultants, evaluation of patient's response to treatment, examination of patient, obtaining history from patient or surrogate, ordering and performing treatments and interventions, ordering and review of laboratory studies, ordering and review of radiographic studies, pulse oximetry and re-evaluation of patient's condition.  Laroy Apple Pulmonary/Critical Care  Personal contact information can be found on Amion  10/29/2021, 7:23 AM

## 2021-10-30 ENCOUNTER — Inpatient Hospital Stay (HOSPITAL_COMMUNITY): Payer: Medicare HMO

## 2021-10-30 DIAGNOSIS — J9602 Acute respiratory failure with hypercapnia: Secondary | ICD-10-CM | POA: Diagnosis not present

## 2021-10-30 DIAGNOSIS — J9601 Acute respiratory failure with hypoxia: Secondary | ICD-10-CM | POA: Diagnosis not present

## 2021-10-30 LAB — BASIC METABOLIC PANEL
Anion gap: 16 — ABNORMAL HIGH (ref 5–15)
BUN: 95 mg/dL — ABNORMAL HIGH (ref 8–23)
CO2: 17 mmol/L — ABNORMAL LOW (ref 22–32)
Calcium: 8 mg/dL — ABNORMAL LOW (ref 8.9–10.3)
Chloride: 97 mmol/L — ABNORMAL LOW (ref 98–111)
Creatinine, Ser: 4.66 mg/dL — ABNORMAL HIGH (ref 0.61–1.24)
GFR, Estimated: 13 mL/min — ABNORMAL LOW (ref >60)
Glucose, Bld: 257 mg/dL — ABNORMAL HIGH (ref 70–99)
Potassium: 4.6 mmol/L (ref 3.5–5.1)
Sodium: 130 mmol/L — ABNORMAL LOW (ref 135–145)

## 2021-10-30 LAB — COOXEMETRY PANEL
Carboxyhemoglobin: 0.4 % — ABNORMAL LOW (ref 0.5–1.5)
Methemoglobin: 1.2 % (ref 0.0–1.5)
O2 Saturation: 84.2 %
Total hemoglobin: 11.9 g/dL — ABNORMAL LOW (ref 12.0–16.0)

## 2021-10-30 LAB — HEPARIN LEVEL (UNFRACTIONATED)
Heparin Unfractionated: >1.1 IU/mL — ABNORMAL HIGH (ref 0.30–0.70)
Heparin Unfractionated: 0.59 IU/mL (ref 0.30–0.70)
Heparin Unfractionated: 0.9 IU/mL — ABNORMAL HIGH (ref 0.30–0.70)

## 2021-10-30 LAB — CBC
HCT: 36.3 % — ABNORMAL LOW (ref 39.0–52.0)
Hemoglobin: 12.1 g/dL — ABNORMAL LOW (ref 13.0–17.0)
MCH: 28.6 pg (ref 26.0–34.0)
MCHC: 33.3 g/dL (ref 30.0–36.0)
MCV: 85.8 fL (ref 80.0–100.0)
Platelets: 254 10*3/uL (ref 150–400)
RBC: 4.23 MIL/uL (ref 4.22–5.81)
RDW: 13.4 % (ref 11.5–15.5)
WBC: 19.9 10*3/uL — ABNORMAL HIGH (ref 4.0–10.5)
nRBC: 0 % (ref 0.0–0.2)

## 2021-10-30 LAB — POCT I-STAT 7, (LYTES, BLD GAS, ICA,H+H)
Acid-base deficit: 8 mmol/L — ABNORMAL HIGH (ref 0.0–2.0)
Bicarbonate: 18.5 mmol/L — ABNORMAL LOW (ref 20.0–28.0)
Calcium, Ion: 1.14 mmol/L — ABNORMAL LOW (ref 1.15–1.40)
HCT: 37 % — ABNORMAL LOW (ref 39.0–52.0)
Hemoglobin: 12.6 g/dL — ABNORMAL LOW (ref 13.0–17.0)
O2 Saturation: 94 %
Patient temperature: 98.9
Potassium: 4.6 mmol/L (ref 3.5–5.1)
Sodium: 130 mmol/L — ABNORMAL LOW (ref 135–145)
TCO2: 20 mmol/L — ABNORMAL LOW (ref 22–32)
pCO2 arterial: 40.8 mmHg (ref 32.0–48.0)
pH, Arterial: 7.265 — ABNORMAL LOW (ref 7.350–7.450)
pO2, Arterial: 80 mmHg — ABNORMAL LOW (ref 83.0–108.0)

## 2021-10-30 LAB — MAGNESIUM: Magnesium: 2.7 mg/dL — ABNORMAL HIGH (ref 1.7–2.4)

## 2021-10-30 LAB — GLUCOSE, CAPILLARY
Glucose-Capillary: 260 mg/dL — ABNORMAL HIGH (ref 70–99)
Glucose-Capillary: 216 mg/dL — ABNORMAL HIGH (ref 70–99)
Glucose-Capillary: 235 mg/dL — ABNORMAL HIGH (ref 70–99)
Glucose-Capillary: 214 mg/dL — ABNORMAL HIGH (ref 70–99)
Glucose-Capillary: 233 mg/dL — ABNORMAL HIGH (ref 70–99)
Glucose-Capillary: 222 mg/dL — ABNORMAL HIGH (ref 70–99)

## 2021-10-30 LAB — PHOSPHORUS: Phosphorus: 6.9 mg/dL — ABNORMAL HIGH (ref 2.5–4.6)

## 2021-10-30 LAB — TRIGLYCERIDES: Triglycerides: 136 mg/dL (ref <150)

## 2021-10-30 MED ORDER — FUROSEMIDE 10 MG/ML IJ SOLN
160.0000 mg | Freq: Two times a day (BID) | INTRAVENOUS | Status: DC
  Administered 2021-10-30 – 2021-10-31 (×2): 160 mg via INTRAVENOUS
  Filled 2021-10-30: qty 16
  Filled 2021-10-30: qty 10
  Filled 2021-10-30: qty 16

## 2021-10-30 MED ORDER — HEPARIN (PORCINE) 25000 UT/250ML-% IV SOLN
700.0000 [IU]/h | INTRAVENOUS | Status: DC
  Administered 2021-10-31: 1000 [IU]/h via INTRAVENOUS
  Administered 2021-11-02: 700 [IU]/h via INTRAVENOUS
  Filled 2021-10-30 (×2): qty 250

## 2021-10-30 MED ORDER — SODIUM CHLORIDE 0.9% FLUSH: 10.0000 mL | INTRAVENOUS | Status: DC | PRN

## 2021-10-30 MED ORDER — LACTATED RINGERS IV BOLUS
500.0000 mL | Freq: Once | INTRAVENOUS | Status: AC
Start: 2021-10-30 — End: 2021-10-30
  Administered 2021-10-30: 08:00:00 500 mL via INTRAVENOUS

## 2021-10-30 MED ORDER — SODIUM CHLORIDE 0.9% FLUSH
10.0000 mL | Freq: Two times a day (BID) | INTRAVENOUS | Status: DC
  Administered 2021-10-30 – 2021-11-14 (×29): 10 mL

## 2021-10-30 MED ORDER — HEPARIN (PORCINE) 25000 UT/250ML-% IV SOLN
1000.0000 [IU]/h | INTRAVENOUS | Status: DC
Start: 2021-10-30 — End: 2021-10-30
  Administered 2021-10-30: 13:00:00 1000 [IU]/h via INTRAVENOUS

## 2021-10-30 NOTE — Progress Notes (Signed)
ANTICOAGULATION CONSULT NOTE - Follow Up Consult  Pharmacy Consult for Heparin drip Indication: atrial fibrillation  Allergies  Allergen Reactions   Entresto [Sacubitril-Valsartan]     Worsening renal function and weight gain    Patient Measurements: Height: 5\' 11"  (180.3 cm) Weight: 86.8 kg (191 lb 5 oz) IBW/kg (Calculated) : 75.3 Heparin Dosing Weight: 80.3 kg   Vital Signs: Temp: 98.4 F (36.9 C) (12/04 1200) Temp Source: Oral (12/04 1200) BP: 157/86 (12/04 1200) Pulse Rate: 121 (12/04 1200)  Labs: Recent Labs    10/28/21 0416 10/28/21 0929 10/28/21 1709 10/29/21 0514 10/29/21 1404 10/29/21 1828 10/30/21 0203 10/30/21 0415 10/30/21 1142  HGB 15.7  --    < > 13.1   < > 13.3 12.6* 12.1*  --   HCT 48.1  --    < > 39.2   < > 39.0 37.0* 36.3*  --   PLT 297  --   --  137*  --   --   --  254  --   HEPARINUNFRC  --   --   --   --   --   --   --  >1.10* 0.59  CREATININE 2.60*  --   --  3.31*  --   --   --  4.66*  --   TROPONINIHS  --  118*  --   --   --   --   --   --   --    < > = values in this interval not displayed.     Estimated Creatinine Clearance: 16.4 mL/min (A) (by C-G formula based on SCr of 4.66 mg/dL (H)).   Medications:  Infusions:   sodium chloride 10 mL/hr at 10/30/21 1200   amiodarone 30 mg/hr (10/30/21 1200)   ceFEPime (MAXIPIME) IV Stopped (10/29/21 1745)   feeding supplement (VITAL AF 1.2 CAL) 50 mL/hr at 10/30/21 1000   fentaNYL infusion INTRAVENOUS 75 mcg/hr (10/30/21 1200)   norepinephrine (LEVOPHED) Adult infusion 16 mcg/min (10/30/21 1200)   promethazine (PHENERGAN) injection (IM or IVPB)     propofol (DIPRIVAN) infusion 15 mcg/kg/min (10/30/21 1200)   vasopressin 0.04 Units/min (10/30/21 1200)    Assessment: 65 year old male with chronic systolic heart failure s/p ICD, s/p CABG, COPD admitted for acute hypoxemic respiratory failure secondary to influenza A. Anticoagulation for new onset atrial fibrillation.  Heparin level 0.59 No  documented signs/symptoms of bleed Continues in afib  Goal of Therapy:  Heparin level 0.3-0.7 units/ml Monitor platelets by anticoagulation protocol: Yes   Plan:   Will start Heparin drip 1000 units/hr Heparin level @2100  Daily heparin level and CBC ordered  Thank you for allowing pharmacy to be a part of this patient's care.  79, PharmD Clinical Pharmacist  Please check AMION for all [facility] Pharmacy numbers After 10:00 PM, call Main Pharmacy [phone]

## 2021-10-30 NOTE — Progress Notes (Signed)
Progress Note  Patient Name: Joseph [name] Date of Encounter: 10/30/2021  [facility] Cardiologist: Minus Breeding, MD    Subjective    65 yo with multifocal pneumonia and influenza, atrial fibrillation/flutter, coronary artery disease, chronic combined systolic and diastolic congestive heart failure  He was transferred from independent hospital for worsening respiratory distress.  He was intubated at [facility] and was transferred to [facility].  He is now being seen in [place]. Continues to have worsening renal failure.   Creatinine this am (12/4) is 4.66.   Troponin levels are still elevated but the trend has been gradually declining.  This is consistent with demand ischemia and not consistent with an acute coronary syndrome.  Remains on the vent. Sedated.   Inpatient Medications    Scheduled Meds:  arformoterol  15 mcg Nebulization BID   aspirin  81 mg Per Tube Daily   atorvastatin  80 mg Per Tube QPM   budesonide (PULMICORT) nebulizer solution  0.5 mg Nebulization BID   chlorhexidine gluconate (MEDLINE KIT)  15 mL Mouth Rinse BID   Chlorhexidine Gluconate Cloth  6 each Topical Daily   docusate  100 mg Per Tube BID   feeding supplement (PROSource TF)  45 mL Per Tube TID   hydrocortisone sod succinate (SOLU-CORTEF) inj  100 mg Intravenous Q8H   insulin aspart  0-15 Units Subcutaneous Q4H   insulin glargine-yfgn  5 Units Subcutaneous QHS   ipratropium  0.5 mg Nebulization TID   mouth rinse  15 mL Mouth Rinse 10 times per day   pantoprazole sodium  40 mg Per Tube Daily   polyethylene glycol  17 g Per Tube Daily   sodium bicarbonate  650 mg Per Tube TID   sodium chloride flush  10-40 mL Intracatheter Q12H   Continuous Infusions:  sodium chloride 10 mL/hr at 10/30/21 1000   amiodarone 30 mg/hr (10/30/21 1000)   ceFEPime (MAXIPIME) IV Stopped (10/29/21 1745)   feeding supplement (VITAL AF 1.2 CAL) 50 mL/hr at 10/30/21 1000   fentaNYL infusion INTRAVENOUS 75 mcg/hr  (10/30/21 1000)   norepinephrine (LEVOPHED) Adult infusion 19 mcg/min (10/30/21 1000)   promethazine (PHENERGAN) injection (IM or IVPB)     propofol (DIPRIVAN) infusion 20 mcg/kg/min (10/30/21 1000)   vasopressin 0.04 Units/min (10/30/21 1000)   PRN Meds: acetaminophen **OR** acetaminophen, albuterol, fentaNYL, polyethylene glycol, promethazine (PHENERGAN) injection (IM or IVPB), sodium chloride flush   Vital Signs    Vitals:   10/30/21 0915 10/30/21 0930 10/30/21 0945 10/30/21 1000  BP:    (!) 143/75  Pulse: 90 87 92 90  Resp: '15 15 15 15  ' Temp:      TempSrc:      SpO2: 100% 100% 100% 100%  Weight:      Height:        Intake/Output Summary (Last 24 hours) at 10/30/2021 1016 Last data filed at 10/30/2021 1000 Gross per 24 hour  Intake 3944.65 ml  Output 500 ml  Net 3444.65 ml    Last 3 Weights 10/30/2021 10/29/2021 10/25/2021  Weight (lbs) 191 lb 5 oz 185 lb 3 oz 177 lb 7.5 oz  Weight (kg) 86.78 kg 84 kg 80.5 kg      Telemetry    Atrial fib  Personally Reviewed  ECG     - Personally Reviewed  Physical Exam     Labs    High Sensitivity Troponin:   Recent Labs  Lab 10/25/21 0708 10/25/21 1945 10/26/21 0140 10/26/21 2952 10/28/21 8413  TROPONINIHS 365* 281* 396* 305* 118*      Chemistry Recent Labs  Lab 10/20/2021 1828 10/23/2021 2306 10/25/21 1945 10/26/21 0140 10/28/21 0416 10/28/21 1636 10/29/21 0514 10/29/21 1404 10/29/21 1710 10/29/21 1828 10/30/21 0203 10/30/21 0415  NA 134*   < > 132*   < > 134*   < > 133*   < >  --  129* 130* 130*  K 4.3   < > 5.1   < > 4.6   < > 5.0   < >  --  4.6 4.6 4.6  CL 103   < > 104   < > 105  --  100  --   --   --   --  97*  CO2 17*   < > 14*   < > 15*  --  19*  --   --   --   --  17*  GLUCOSE 199*   < > 386*   < > 193*  --  162*  --   --   --   --  257*  BUN 33*   < > 39*   < > 55*  --  74*  --   --   --   --  95*  CREATININE 2.01*   < > 2.30*   < > 2.60*  --  3.31*  --   --   --   --  4.66*  CALCIUM 9.2   < >  8.1*   < > 8.2*  --  8.0*  --   --   --   --  8.0*  MG  --    < > 2.3   < >  --    < > 2.5*  --  2.8*  --   --  2.7*  PROT 8.3*  --  6.7  --   --   --   --   --   --   --   --   --   ALBUMIN 4.1  --  3.0*  --   --   --   --   --   --   --   --   --   AST 40  --  56*  --   --   --   --   --   --   --   --   --   ALT 36  --  46*  --   --   --   --   --   --   --   --   --   ALKPHOS 121  --  103  --   --   --   --   --   --   --   --   --   BILITOT 0.7  --  0.4  --   --   --   --   --   --   --   --   --   GFRNONAA 36*   < > 30*   < > 26*  --  20*  --   --   --   --  13*  ANIONGAP 14   < > 14   < > 14  --  14  --   --   --   --  16*   < > = values in this interval not displayed.     Lipids  Recent Labs  Lab 10/30/21 0415  TRIG 136  Hematology Recent Labs  Lab 10/28/21 0416 10/28/21 1709 10/29/21 0514 10/29/21 1404 10/29/21 1828 10/30/21 0203 10/30/21 0415  WBC 28.4*  --  13.7*  --   --   --  19.9*  RBC 5.27  --  4.53  --   --   --  4.23  HGB 15.7   < > 13.1   < > 13.3 12.6* 12.1*  HCT 48.1   < > 39.2   < > 39.0 37.0* 36.3*  MCV 91.3  --  86.5  --   --   --  85.8  MCH 29.8  --  28.9  --   --   --  28.6  MCHC 32.6  --  33.4  --   --   --  33.3  RDW 13.7  --  13.3  --   --   --  13.4  PLT 297  --  137*  --   --   --  254   < > = values in this interval not displayed.    Thyroid No results for input(s): TSH, FREET4 in the last 168 hours.  BNP Recent Labs  Lab 10/25/2021 1909 10/25/21 1945 10/28/21 1746  BNP 243.0* 383.0* 377.2*     DDimer  Recent Labs  Lab 10/25/21 1945  DDIMER 2.30*      Radiology    US RENAL  Result Date: 10/29/2021 CLINICAL DATA:  AKI EXAM: RENAL / URINARY TRACT ULTRASOUND COMPLETE COMPARISON:  None. FINDINGS: Right Kidney: Not well visualized with cortical thinning and possibly increased echogenicity. Renal measurements: 9.1 x 4.4 x 4.4 cm = volume: 93.5 mL. No mass or hydronephrosis visualized. Left Kidney: Renal measurements: 10.9 x 5.1  x 4.9 cm = volume: 141.2 mL. Echogenicity within normal limits. No mass or hydronephrosis visualized. Bladder: Partially decompressed by Foley catheter. Other: None. IMPRESSION: No hydronephrosis. The right kidney is not as well visualized with cortical thinning and possibly increased echogenicity suggesting medical renal disease. KA Electronically Signed   By: Macy Mis M.D.   On: 10/29/2021 17:55   DG CHEST PORT 1 VIEW  Result Date: 10/29/2021 CLINICAL DATA:  Acute respiratory failure.  Central line placement. EXAM: PORTABLE CHEST 1 VIEW COMPARISON:  Chest x-ray from earlier same day. FINDINGS: Interval placement of a LEFT IJ central line with tip positioned at the level of the upper SVC. Endotracheal tube remains adequately positioned with tip at the level the clavicles. Enteric tube passes below the diaphragm. Diffuse bilateral airspace opacities are not significantly changed in the short-term interval, perhaps slightly increased in density compared to the earlier chest x-ray of 10/28/2021. No pneumothorax is seen. Heart size and mediastinal contours are grossly stable. LEFT chest wall pacemaker/ICD apparatus in place. IMPRESSION: 1. Interval placement of a LEFT IJ central line with tip positioned at the level of the upper SVC. No pneumothorax seen. 2. Diffuse bilateral airspace opacities, not significantly changed, compatible with multifocal pneumonia versus pulmonary edema/ARDS. Electronically Signed   By: Franki Cabot M.D.   On: 10/29/2021 15:49   DG CHEST PORT 1 VIEW  Result Date: 10/29/2021 CLINICAL DATA:  Acute respiratory failure EXAM: PORTABLE CHEST 1 VIEW COMPARISON:  10/28/2021 FINDINGS: Endotracheal and enteric tubes are again identified. Persistent bilateral pulmonary opacities with perihilar and lower lung predominance. Aeration is not substantially changed. Stable cardiomediastinal contours. Left chest wall ICD. Probable small left pleural effusion remains present. IMPRESSION:  Persistent bilateral pulmonary opacities likely reflecting edema and/or pneumonia. Aeration is similar. Probable small left  pleural effusion. Electronically Signed   By: Macy Mis M.D.   On: 10/29/2021 09:42   Portable Chest x-ray  Result Date: 10/28/2021 CLINICAL DATA:  65 year old male, assess ET tube placement. EXAM: PORTABLE CHEST - 1 VIEW COMPARISON:  Earlier the same day FINDINGS: The mediastinal contours are within normal limits. Similar appearing postsurgical changes after coronary artery bypass graft. Unchanged position of endotracheal tube distal tip in the midthoracic trachea. Gastric decompression tube tip terminates off the inferior aspect of this image. Unchanged appearance of left chest wall single lead pacemaker/AICD. No cardiomegaly. Similar appearing diffuse basal predominant patchy pulmonary opacities. Likely trace bilateral pleural effusions. No pneumothorax. No acute osseous abnormality. IMPRESSION: 1. Unchanged, appropriate position of endotracheal tube. 2. Unchanged basal predominant diffuse pulmonary opacities. Electronically Signed   By: Ruthann Cancer M.D.   On: 10/28/2021 16:48   DG CHEST PORT 1 VIEW  Result Date: 10/28/2021 CLINICAL DATA:  Respiratory distress. EXAM: PORTABLE CHEST 1 VIEW COMPARISON:  Same day. FINDINGS: Stable cardiomediastinal silhouette. Status post coronary bypass graft. Endotracheal tube appears to be in grossly good position. Stable position of left-sided pacemaker is noted. Stable bilateral lung opacities are noted. Bony thorax is unremarkable. IMPRESSION: Endotracheal tube in grossly good position. Stable bilateral lung opacities. Electronically Signed   By: Marijo Conception M.D.   On: 10/28/2021 10:36   DG Abd Portable 1V  Result Date: 10/28/2021 CLINICAL DATA:  OG tube placement EXAM: PORTABLE ABDOMEN - 1 VIEW COMPARISON:  Concurrently obtained chest x-ray; prior abdominal radiograph from earlier today at 1:16 p.m. FINDINGS: A gastric tube has  been placed. The tip of the tube overlies the left upper quadrant presumably within the stomach. Multifocal opacities throughout the visualized lungs. Patient is status post median sternotomy with evidence of prior multivessel CABG. An intracardiac defibrillator lead projects over the right heart. IMPRESSION: Successful placement of gastric tube which overlies the stomach. Electronically Signed   By: Jacqulynn Cadet M.D.   On: 10/28/2021 16:43   DG Abd Portable 1V  Result Date: 10/28/2021 CLINICAL DATA:  Orogastric tube placement EXAM: PORTABLE ABDOMEN - 1 VIEW COMPARISON:  CT abdomen 03/12/2010 FINDINGS: No orogastric tube is identified over the upper abdomen. Because of this, we image the chest, and the orogastric tube is not visible in the chest either. There is a endotracheal tube which sits about 6.7 cm above the carina. AICD noted. Prior CABG. Patchy bilateral perihilar airspace opacities. Please note that today's image of the chest with shot with parameters favoring visualization of abdominal structures and is not a dedicated chest radiograph. Upper abdominal bowel gas pattern unremarkable. Lower lumbar spondylosis. IMPRESSION: 1. No orogastric tube is visible over the chest or abdomen. 2. Substantial bilateral perihilar airspace opacities in the chest. Electronically Signed   By: Van Clines M.D.   On: 10/28/2021 15:56    Cardiac Studies      Patient Profile     65 y.o. male  with hx of CABG , chronic combined CHF, admitted to [facility] with respiratory failure due to flu and pneumonia. Complicated by acute renal insufficiency   Assessment & Plan      Atrial fib:   has converted back to sinus . Is in an out of atrial fib . ( Is in Afib today )   Is on heparin    2.  CAD :   hx of CABG Troponins are not c/w acs - more c/w demand ischemia .  3.  Chronic combined CHF :  continue supportive care.   We are limited in our medical therapy by his worsening renal insufficiency . Is  making some urine this am  Cont supportive care.      For questions or updates, please contact [place] Please consult [email or web address] for contact info under        Signed, Mertie Moores, MD  10/30/2021, 10:16 AM

## 2021-10-30 NOTE — Progress Notes (Signed)
ANTICOAGULATION CONSULT NOTE  Pharmacy Consult for Heparin drip Indication: atrial fibrillation  Allergies  Allergen Reactions   Entresto [Sacubitril-Valsartan]     Worsening renal function and weight gain    Patient Measurements: Height: 5\' 11"  (180.3 cm) Weight: 86.8 kg (191 lb 5 oz) IBW/kg (Calculated) : 75.3 Heparin Dosing Weight: 80.3 kg   Vital Signs: Temp: 98.2 F (36.8 C) (12/04 1932) Temp Source: Oral (12/04 1932) BP: 123/62 (12/04 2200) Pulse Rate: 92 (12/04 2200)  Labs: Recent Labs    10/28/21 0416 10/28/21 0929 10/28/21 1709 10/29/21 0514 10/29/21 1404 10/29/21 1828 10/30/21 0203 10/30/21 0415 10/30/21 1142 10/30/21 2225  HGB 15.7  --    < > 13.1   < > 13.3 12.6* 12.1*  --   --   HCT 48.1  --    < > 39.2   < > 39.0 37.0* 36.3*  --   --   PLT 297  --   --  137*  --   --   --  254  --   --   HEPARINUNFRC  --   --   --   --   --   --   --  >1.10* 0.59 0.90*  CREATININE 2.60*  --   --  3.31*  --   --   --  4.66*  --   --   TROPONINIHS  --  118*  --   --   --   --   --   --   --   --    < > = values in this interval not displayed.     Estimated Creatinine Clearance: 16.4 mL/min (A) (by C-G formula based on SCr of 4.66 mg/dL (H)).  Assessment: 65 y.o. male with Afib for heparin.  Received Lovenox 11/30-12/2, now with AKI.  Heparin level remains supratherapeutic as enoxaparin is slow to clear.  Goal of Therapy:  Heparin level 0.3-0.7 units/ml Monitor platelets by anticoagulation protocol: Yes   Plan:  Hold heparin until 0400, then restart at 1000 units/hr Check heparin level in 8 hours.  10-20-1991, PharmD, BCPS

## 2021-10-30 NOTE — Consult Note (Addendum)
Advanced Heart Failure Team Consult Note   Primary Physician: [name], [name], MD PCP-Cardiologist:  Minus Breeding, MD  Reason for Consultation: Shock  HPI:    Joseph [name] is seen today for evaluation of shock at the request of Dr. Verlee Monte.   [name] is a 65 y.o. male with a hx of CAD (s/p CABG in 2000, patent grafts by cath in 2011 and patent grafts by repeat cath in 08/2020), HFrEF (EF 35-40% by echo in 08/2020, s/p St. 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   He presented to  [facility] ED on 09/28/2021 with respiratory distress.  COVID negative but positive for Influenza A. Lactic Acid 4.5. ABG showing pH 7.097, CO2 65.7 and Bicarb 15.3. BNP 243. Initial Hs Troponin 17 with repeat values of 175, 426, 466 and 365. EKG shows NSR, HR 73 with PVC's and LVH with repol which is similar to prior tracings. Chest CT shows cardiomegaly with interstitial edema and small to moderate layering pleural effusions and multifocal lung opacities possibly due to pneumonitis or viral etiologies.    He was admitted for SIRS in the setting of multifocal PNA and Influenza A and started on IV Ceftriaxone and Azithromycin.  Currently on ventilator. CXR with diffuse severe infiltrates. Cr 1.8->-> 4.3 On  NE 20 VP 0.04  SBP up to 150  Co-ox 84%  Will awaken on vent  Remains in AF with RVR on IV amio  Echo 10/25/21 EF 35-40% RV ok mod MR Personally reviewed   Review of Systems: unavailable due to intubation   Home Medications Prior to Admission medications   Medication Sig Start Date End Date Taking? Authorizing Provider  acetaminophen (TYLENOL) 500 MG tablet Take 500 mg by mouth every 6 (six) hours as needed for pain.   Yes [provider]  albuterol (VENTOLIN HFA) 108 (90 Base) MCG/ACT inhaler Inhale 2 puffs into the lungs every 4 (four) hours as needed for wheezing or shortness of breath.  11/91/47  Yes Delora Fuel, MD  aspirin EC 81 MG tablet Take 81 mg by mouth every evening. Swallow whole.   Yes [provider]  atorvastatin (LIPITOR) 80 MG tablet Take 1 tablet (80 mg total) by mouth every evening. 11/29/20  Yes [name], [name], MD  enalapril (VASOTEC) 20 MG tablet Take 1 tablet (20 mg total) by mouth 2 (two) times daily. 04/20/21  Yes Strader, [place] M, PA-C  fish oil-omega-3 fatty acids 1000 MG capsule Take 1 g by mouth 3 (three) times daily.   Yes [provider]  hydrOXYzine (VISTARIL) 25 MG capsule Take 1 capsule (25 mg total) by mouth 3 (three) times daily as needed. 09/13/20  Yes [name], [name], MD  ipratropium (ATROVENT) 0.02 % nebulizer solution Take 500 mcg by nebulization every 4 (four) hours as needed for shortness of breath. 08/01/21  Yes [provider]  JARDIANCE 10 MG TABS tablet TAKE 1 TABLET BY MOUTH DAILY BEFORE BREAKFAST. 09/05/21  Yes Gracelee Stemmler, Shaune Pascal, MD  metoprolol succinate (TOPROL-XL) 100 MG 24 hr tablet Take 1 tablet (100 mg total) by mouth daily. Take with or immediately following a meal. 09/14/20 07/29/22 Yes Strader, [place] M, PA-C  Multiple Vitamin (MULTIVITAMIN WITH MINERALS) TABS tablet Take 1 tablet by mouth daily.   Yes [provider]  nitroGLYCERIN (NITROSTAT) 0.4 MG SL tablet Place 1 tablet (0.4 mg total) under  the tongue every 5 (five) minutes as needed for chest pain. 05/24/16  Yes Minus Breeding, MD  omeprazole (PRILOSEC) 20 MG capsule Take 1 capsule (20 mg total) by mouth daily. 09/02/21  Yes [name], [name], MD  spironolactone (ALDACTONE) 25 MG tablet Take 0.5 tablets (12.5 mg total) by mouth daily. 02/04/21 07/29/22 Yes Clegg, Amy D, NP  torsemide (DEMADEX) 20 MG tablet Take 1 tablet (20 mg total) by mouth 2 (two) times a week. May take an extra for weight gain or edema 10/03/21 01/01/22 Yes Bensimhon, Shaune Pascal, MD  albuterol (PROVENTIL) (2.5 MG/3ML) 0.083% nebulizer solution Take 2.5 mg by  nebulization every 4 (four) hours as needed for shortness of breath. 08/11/21   [provider]  doxycycline (VIBRAMYCIN) 100 MG capsule Take 100 mg by mouth 2 (two) times daily. Patient not taking: Reported on 10/17/2021 08/24/21   [provider]  EPINEPHrine 0.3 mg/0.3 mL IJ SOAJ injection Inject into the muscle as directed. Patient not taking: Reported on 09/28/2021 08/09/21   [provider]  predniSONE (STERAPRED UNI-PAK 21 TAB) 10 MG (21) TBPK tablet Use as directed Patient not taking: Reported on 10/18/2021 10/11/2021   Sharion Balloon, FNP    Past Medical History: Past Medical History:  Diagnosis Date   CHF (congestive heart failure) ([place])    a. EF 25-30% in 2016 b. 35-40% by echo in 10/2017 c. 35-40% in 08/2020   COPD (chronic obstructive pulmonary disease) ([place])    Coronary artery disease    a. s/p CABG in 2000 b. patent grafts in 2011 c. 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 Jude   Small bowel obstruction ([place]) 2007   resolved without surgery    Past Surgical History: Past Surgical History:  Procedure Laterality Date   basilic vein left arm resection     CARDIAC DEFIBRILLATOR PLACEMENT     CORONARY ARTERY BYPASS GRAFT     6 vessels, 2000   INSERT / REPLACE / REMOVE PACEMAKER  01/05/10   ICD insertion/St. Jude single chamber   RIGHT/LEFT HEART CATH AND CORONARY/GRAFT ANGIOGRAPHY N/A 09/23/2020   Procedure: RIGHT/LEFT HEART CATH AND CORONARY/GRAFT ANGIOGRAPHY;  Surgeon: Sherren Mocha, MD;  Location: [facility];  Service: Cardiovascular;  Laterality: N/A;    Family History: Family History   Problem Relation Age of Onset   Heart attack Mother    Emphysema Father    Coronary artery disease Father        s/p cabg   Colon cancer Neg Hx     Social History: Social History   Socioeconomic History   Marital status: Married    Spouse name: Inez Catalina   Number of children: 3   Years of education: GED   Highest education level: Not on file  Occupational History   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  Tobacco Use   Smoking status: Former    Types: Cigarettes    Start date: 11/28/1967    Quit date: 02/29/1988    Years since quitting: 33.6   Smokeless tobacco: Never  Vaping Use   Vaping Use: Never used  Substance and Sexual Activity   Alcohol use: No    Alcohol/week: 0.0 standard drinks   Drug use: No   Sexual activity: Never  Other Topics Concern   Not on file  Social History Narrative   1 son died age 68-Lee's syndrome         Social Determinants of Health   Financial Resource Strain: Not on file  Food Insecurity: Not on file  Transportation Needs: Not on file  Physical Activity: Not on file  Stress: Not on file  Social Connections: Not on file    Allergies:  Allergies  Allergen Reactions   Entresto [Sacubitril-Valsartan]     Worsening renal function and weight gain    Objective:    Vital Signs:   Temp:  [98 F (36.7 C)-98.7 F (37.1 C)] 98.4 F (36.9 C) (12/04 1200) Pulse Rate:  [76-121] 121 (12/04 1200) Resp:  [9-18] 18 (12/04 1200) BP: (87-160)/(53-86) 157/86 (12/04 1200) SpO2:  [95 %-100 %] 100 % (12/04 1200) Arterial Line BP: (82-168)/(42-74) 168/74 (12/04 1200) FiO2 (%):  [50 %-70 %] 50 % (12/04 1200) Weight:  [86.8 kg] 86.8 kg (12/04 0419) Last BM Date: 10/21/2021  Weight change: Filed Weights   10/25/21 0043 10/29/21 0427 10/30/21 0419  Weight: 80.5 kg 84 kg 86.8 kg    Intake/Output:   Intake/Output Summary (Last 24 hours) at 10/30/2021 1324 Last data filed at 10/30/2021 1200 Gross per 24 hour  Intake 3724.9 ml  Output  375 ml  Net 3349.9 ml      Physical Exam    General:  On vent HEENT: normal + ETT Neck: supple. JVP up  LIJ TLC  Cor: PMI nondisplaced. Irregular tachy Lungs: coarse  Abdomen: soft, nontender, + distended. No hepatosplenomegaly. No bruits or masses. Good bowel sounds. Extremities: no cyanosis, clubbing, rash, 1+ edema Neuro: sedated on vent but will arouse   Telemetry   AF 100-110 Personally reviewed   Labs   Basic Metabolic Panel: Recent Labs  Lab 10/26/21 0140 10/27/21 0432 10/28/21 0416 10/28/21 1636 10/28/21 1709 10/29/21 0514 10/29/21 1404 10/29/21 1710 10/29/21 1828 10/30/21 0203 10/30/21 0415  NA 137 135 134*  --    < > 133* 131*  --  129* 130* 130*  K 4.4 4.0 4.6  --    < > 5.0 4.5  --  4.6 4.6 4.6  CL 108 105 105  --   --  100  --   --   --   --  97*  CO2 17* 18* 15*  --   --  19*  --   --   --   --  17*  GLUCOSE 129* 136* 193*  --   --  162*  --   --   --   --  257*  BUN 46* 51* 55*  --   --  74*  --   --   --   --  95*  CREATININE 2.07* 1.98* 2.60*  --   --  3.31*  --   --   --   --  4.66*  CALCIUM 8.3* 8.3* 8.2*  --   --  8.0*  --   --   --   --  8.0*  MG 2.3 2.3  --  2.5*  --  2.5*  --  2.8*  --   --  2.7*  PHOS  --   --   --  5.9*  --  6.6*  --  7.4*  --   --  6.9*   < > = values in this interval not displayed.    Liver Function Tests: Recent Labs  Lab 10/23/2021 1828 10/25/21 1945  AST 40 56*  ALT 36 46*  ALKPHOS 121 103  BILITOT 0.7 0.4  PROT 8.3* 6.7  ALBUMIN 4.1 3.0*   No results for input(s): LIPASE, AMYLASE in the last 168 hours. No results for input(s): AMMONIA in the last 168 hours.  CBC: Recent Labs  Lab 09/29/2021 1828 10/25/21 0121 10/25/21 1945 10/27/21 0432 10/28/21 0416 10/28/21 1709 10/29/21 0514 10/29/21 1404 10/29/21 1828 10/30/21 0203 10/30/21 0415  WBC 11.8*   < > 17.1* 17.1* 28.4*  --  13.7*  --   --   --  19.9*  NEUTROABS 5.8  --   --   --   --   --   --   --   --   --   --   HGB 17.9*   < > 16.2 14.1  15.7   < > 13.1 12.6* 13.3 12.6* 12.1*  HCT 58.7*   < > 50.7 42.6 48.1   < > 39.2 37.0* 39.0 37.0* 36.3*  MCV 95.6   < > 92.5 88.6 91.3  --  86.5  --   --   --  85.8  PLT 186   < > 292 203 297  --  137*  --   --   --  254   < > = values in this interval not displayed.    Cardiac Enzymes: No results for input(s): CKTOTAL, CKMB, CKMBINDEX, TROPONINI in the last 168 hours.  BNP: BNP (last 3 results) Recent Labs    10/25/2021 1909 10/25/21 1945 10/28/21 1746  BNP 243.0* 383.0* 377.2*    ProBNP (last 3 results) No results for input(s): PROBNP in the last 8760 hours.   CBG: Recent Labs  Lab 10/29/21 1913 10/29/21 2331 10/30/21 0304 10/30/21 0747 10/30/21 1145  GLUCAP 307* 311* 260* 216* 235*    Coagulation Studies: No results for input(s): LABPROT, INR in the last 72 hours.   Imaging   US RENAL  Result Date: 10/29/2021 CLINICAL DATA:  AKI EXAM: RENAL / URINARY TRACT ULTRASOUND COMPLETE COMPARISON:  None. FINDINGS: Right Kidney: Not well visualized with cortical thinning and possibly increased echogenicity. Renal measurements: 9.1 x 4.4 x 4.4 cm = volume: 93.5 mL. No mass or hydronephrosis visualized. Left Kidney: Renal measurements: 10.9 x 5.1 x 4.9 cm = volume: 141.2 mL. Echogenicity within normal limits. No mass or hydronephrosis visualized. Bladder: Partially decompressed by Foley catheter. Other: None. IMPRESSION: No hydronephrosis. The right kidney is not as well visualized with cortical thinning and possibly increased echogenicity suggesting medical renal disease. KA Electronically Signed   By: Macy Mis M.D.   On: 10/29/2021 17:55   DG CHEST PORT 1 VIEW  Result Date: 10/29/2021 CLINICAL DATA:  Acute respiratory failure.  Central line placement. EXAM: PORTABLE CHEST 1 VIEW COMPARISON:  Chest x-ray from earlier same day. FINDINGS: Interval placement of a LEFT IJ central line with tip positioned at the level of the upper SVC. Endotracheal tube remains adequately  positioned with tip at the level the clavicles. Enteric tube passes below the diaphragm. Diffuse bilateral airspace opacities are not significantly changed in the short-term interval, perhaps slightly increased in density compared to the earlier chest x-ray of 10/28/2021. No pneumothorax is seen. Heart size and mediastinal contours are grossly stable. LEFT chest wall pacemaker/ICD apparatus in place. IMPRESSION: 1. Interval placement of a LEFT IJ  central line with tip positioned at the level of the upper SVC. No pneumothorax seen. 2. Diffuse bilateral airspace opacities, not significantly changed, compatible with multifocal pneumonia versus pulmonary edema/ARDS. Electronically Signed   By: Franki Cabot M.D.   On: 10/29/2021 15:49     Medications:     Current Medications:  arformoterol  15 mcg Nebulization BID   aspirin  81 mg Per Tube Daily   atorvastatin  80 mg Per Tube QPM   budesonide (PULMICORT) nebulizer solution  0.5 mg Nebulization BID   chlorhexidine gluconate (MEDLINE KIT)  15 mL Mouth Rinse BID   Chlorhexidine Gluconate Cloth  6 each Topical Daily   docusate  100 mg Per Tube BID   feeding supplement (PROSource TF)  45 mL Per Tube TID   hydrocortisone sod succinate (SOLU-CORTEF) inj  100 mg Intravenous Q8H   insulin aspart  0-15 Units Subcutaneous Q4H   insulin glargine-yfgn  5 Units Subcutaneous QHS   ipratropium  0.5 mg Nebulization TID   mouth rinse  15 mL Mouth Rinse 10 times per day   pantoprazole sodium  40 mg Per Tube Daily   polyethylene glycol  17 g Per Tube Daily   sodium bicarbonate  650 mg Per Tube TID   sodium chloride flush  10-40 mL Intracatheter Q12H    Infusions:  sodium chloride 10 mL/hr at 10/30/21 1200   amiodarone 30 mg/hr (10/30/21 1200)   ceFEPime (MAXIPIME) IV Stopped (10/29/21 1745)   feeding supplement (VITAL AF 1.2 CAL) 50 mL/hr at 10/30/21 1000   fentaNYL infusion INTRAVENOUS 75 mcg/hr (10/30/21 1200)   furosemide     heparin 1,000 Units/hr  (10/30/21 1248)   norepinephrine (LEVOPHED) Adult infusion 16 mcg/min (10/30/21 1200)   promethazine (PHENERGAN) injection (IM or IVPB)     propofol (DIPRIVAN) infusion 15 mcg/kg/min (10/30/21 1200)   vasopressin 0.04 Units/min (10/30/21 1200)      Assessment/Plan   1. Septic/distributive shock - Co-ox 84% - can wean NE as tolerated. Continue VP - Continue cefepime and stress-dose steroids - he is volume replete/overloaded  2. Acute hypoxic respiratory failure - has PNA/ARDS with diffuse lung injury on CXR - continue vent support - will attempt diuresis as renal function permits  3. Acute on chronic systolic HF due to iCM - EF stable 35-40% on echo - Volume overloaded in setting of fluid resuscitation for sepsis - Will diurese with lasix 160 IV bid - Set up CVP line. Continue to follow co-ox - Co-ox 84% - Off GDMT due to shock/AKI  4. PAF with RVR - continue amio gtt and heparin  5. AKI on CKD 3b due to ATN/shock - baseline Scr 1.5-2.0. Now up to 4.3 - still making urine - renal u/s with medico-renal disease in R>L kidney - Keep MAP > 70 - Family ok with CVVHD if needed  7. CAD with elevated trop due to demand ischemia - stable.  - continue ASA/statin - doubt ischemia is a major issue here  - no b-blocker with shock  CRITICAL CARE Performed by: Glori Bickers  Total critical care time: 55 minutes  Critical care time was exclusive of separately billable procedures and treating other patients.  Critical care was necessary to treat or prevent imminent or life-threatening deterioration.  Critical care was time spent personally by me (independent of midlevel providers or residents) on the following activities: development of treatment plan with patient and/or surrogate as well as nursing, discussions with consultants, evaluation of patient's response to treatment, examination  of patient, obtaining history from patient or surrogate, ordering and performing  treatments and interventions, ordering and review of laboratory studies, ordering and review of radiographic studies, pulse oximetry and re-evaluation of patient's condition.   Length of Stay: 6  Glori Bickers, MD  10/30/2021, 1:24 PM  Advanced Heart Failure Team Pager [phone] (M-F; 7a - 5p)  Please contact [facility] for night-coverage after hours (4p -7a ) and weekends on [email or web address]

## 2021-10-30 NOTE — Progress Notes (Signed)
RT NOTE: Patient does not meet SBT criteria for this AM due to FIO2 requirements.  Tolerating current ventilator settings well at this time.  Will continue to monitor.

## 2021-10-30 NOTE — Progress Notes (Signed)
eLink Physician-Brief Progress Note Patient Name: Joseph [name] DOB: 1956-01-23 MRN: [id number]   Date of Service  10/30/2021  HPI/Events of Note  ABGand metabolic acidosis. 7.26.  on PRVC-AC is back. Stable type 2 failure.   eICU Interventions  Continue care.      Intervention Category Intermediate Interventions: Diagnostic test evaluation  Ranee Gosselin 10/30/2021, 2:23 AM

## 2021-10-30 NOTE — Progress Notes (Signed)
NAME:  Joseph [name], MRN:  [id number], DOB:  [birth year], LOS: 86 ADMISSION DATE:  10/15/2021, CONSULTATION DATE:  10/28/2021 REFERRING MD:  Dr. Carles Collet, CHIEF COMPLAINT:  Acute hypoxic respiratory failure    History of Present Illness:  Joseph [name] is a 65 y.o. male with a PMH significant for CHF, COPD, CAD s/p CABG, HTN, HLD, MRSA bacteremia, and prior GI bleed who presented to the ED for acute respiratory distress.   On ED arrival patient was placed on BIPAP for significant hypoxia. Workup revealed patient was Flu A positive with underlying COPD exacerbation and likely CHF exacerbation with component of pulmonary edema as well. He was treated with IV diuretics, Tamaflu, CAP coverage, Amiodarone drip with beta blocker for persistent A-fib, and steroids with limited improvement in respiratory status leading to the decision to intubated and transfer to [facility] for further pulmonary and cardiac care   Pertinent  Medical History  CHF, COPD, CAD s/p CABG, HTN, HLD, MRSA bacteremia, and prior GI bleed  Significant Hospital Events: Including procedures, antibiotic start and stop dates in addition to other pertinent events   11/28 admitted at [facility] for acute hypoxic respiratory failure  12/2 [facility]ubated and [facility]   Interim History / Subjective:  Worsening shock yesterday, seems distributive based on coox, warm BLE. Had seemed on volume overloaded side earlier in the day so held off on fluids. Started on vaso, stress dose steroids.  Making 100 mL/hr urine this morning surprisingly. Synchronous with vent with current sedation, weaning to minimal settings with RASS -2 to -3.    Objective   Blood pressure (!) 142/82, pulse 85, temperature 98.7 F (37.1 C), temperature source Oral, resp. rate 15, height 5\' 11"  (1.803 m), weight 86.8 kg, SpO2 100 %.    Vent Mode: PRVC FiO2 (%):  [60 %-70 %] 70 % Set Rate:  [10 bmp-15 bmp] 15 bmp Vt Set:  [600 mL] 600 mL PEEP:  [5 cmH20-8 cmH20] 5  cmH20 Pressure Support:  [8 cmH20] 8 cmH20 Plateau Pressure:  [16 cmH20-18 cmH20] 18 cmH20   Intake/Output Summary (Last 24 hours) at 10/30/2021 [id number] Last data filed at 10/30/2021 0600 Gross per 24 hour  Intake 3286.63 ml  Output 500 ml  Net 2786.63 ml   Filed Weights   10/25/21 0043 10/29/21 0427 10/30/21 0419  Weight: 80.5 kg 84 kg 86.8 kg    Examination: General appearance: 65 y.o., male, intubated, sedated Eyes: anicteric sclerae; PERRL HENT: NCAT; MMM Lungs: +less prominent bilateral expiratory wheeze, equal chest rise CV: tachy IRIR, no murmur  Abdomen: Soft, non-tender; non-distended, BS present  Extremities: trace peripheral edema, warm Skin: Normal turgor and texture; no rash Neuro: deeply sedated, grossly nonfocal  pH stable 7.26 S Cr rising WBC 19  CXR lines and tubes ok, bilateral alveolar opacities worsening aeration  Renal US no hydro  Resolved Hospital Problem list     Assessment & Plan:  # Acute Hypoxic and Hypercapnic Respiratory Failure with moderate ARDS  # Flu A positive  # Acute COPD exacerbation  Some volume overload still to be dealt with but I think he does have ARDS in setting of flu, possible superimposed HAP.  P: - Lung protective ventilation - Follow cultures  - VAP bundle in place  - PAD protocol - cefepime, narrow as able - S/p course of tamiflu - Continue BDs - IV steroids for shock as below - Keep RASS -2   # Shock Distributive based on coox and  warm BLE ext on exam. ?Septic and effect of sedation. - trend coox - wean levo for MAP 65 - vaso 0.04 - stress dose steroids 12/3-  # Acute renal failure, oliguric -Baseline creatinine 1.4-1.6 with admission creatinine 2.01 P: Looks volume overloaded if anything, diuretic challenge today Trend Bmet Avoid nephrotoxins Ensure adequate renal perfusion   Hx of HFrEF with ischemic cardiomyopathy  -EF 35-40% Persistent A-Flutter/A-fib with RVR -CHA2DS2-VASc score 4 Elevated  troponin with hx of CAD -s/p CABG 2000 P: - Cardiology following  - Heparin gtt - amiodarone - hold Beta Blocker, in shock - Strict intake and output  - Daily weight to assess volume status  Type 2 diabetes  -Home medication included Jardiance, Hgb A1C 5.6 P: SSI CBG goal 140-180  Stage 1 coccyx pressure injury P: Pressure alleviating devices  Local care    Best Practice (right click and "Reselect all SmartList Selections" daily)   Diet/type: tubefeeds DVT prophylaxis: systemic heparin GI prophylaxis: PPI Lines: N/A Foley:  N/A Code Status:  full code Last date of multidisciplinary goals of care discussion: Pending  Critical care time:   Performed by: Omar Person  Total critical care time: 30 minutes  Critical care time was exclusive of separately billable procedures and treating other patients.  Critical care was necessary to treat or prevent imminent or life-threatening deterioration.  Critical care was time spent personally by me on the following activities: development of treatment plan with patient and/or surrogate as well as nursing, discussions with consultants, evaluation of patient's response to treatment, examination of patient, obtaining history from patient or surrogate, ordering and performing treatments and interventions, ordering and review of laboratory studies, ordering and review of radiographic studies, pulse oximetry and re-evaluation of patient's condition.  Laroy Apple Pulmonary/Critical Care  Personal contact information can be found on Amion  10/30/2021, 7:22 AM

## 2021-10-30 NOTE — Progress Notes (Signed)
ANTICOAGULATION CONSULT NOTE - Follow Up Consult  Pharmacy Consult for Heparin drip Indication: atrial fibrillation  Allergies  Allergen Reactions   Entresto [Sacubitril-Valsartan]     Worsening renal function and weight gain    Patient Measurements: Height: 5\' 11"  (180.3 cm) Weight: 86.8 kg (191 lb 5 oz) IBW/kg (Calculated) : 75.3 Heparin Dosing Weight: 80.3 kg   Vital Signs: Temp: 98.7 F (37.1 C) (12/04 0319) Temp Source: Oral (12/04 0319) BP: 141/75 (12/04 0400) Pulse Rate: 89 (12/04 0430)  Labs: Recent Labs    10/28/21 0416 10/28/21 0929 10/28/21 1709 10/29/21 0514 10/29/21 1404 10/29/21 1828 10/30/21 0203 10/30/21 0415  HGB 15.7  --    < > 13.1   < > 13.3 12.6* 12.1*  HCT 48.1  --    < > 39.2   < > 39.0 37.0* 36.3*  PLT 297  --   --  137*  --   --   --  254  HEPARINUNFRC  --   --   --   --   --   --   --  >1.10*  CREATININE 2.60*  --   --  3.31*  --   --   --  4.66*  TROPONINIHS  --  118*  --   --   --   --   --   --    < > = values in this interval not displayed.     Estimated Creatinine Clearance: 16.4 mL/min (A) (by C-G formula based on SCr of 4.66 mg/dL (H)).   Medications:  Infusions:   sodium chloride 10 mL/hr at 10/30/21 0400   amiodarone 30 mg/hr (10/30/21 0400)   ceFEPime (MAXIPIME) IV Stopped (10/29/21 1745)   feeding supplement (VITAL AF 1.2 CAL) 1,000 mL (10/29/21 1159)   fentaNYL infusion INTRAVENOUS 200 mcg/hr (10/30/21 0400)   heparin 1,000 Units/hr (10/30/21 0400)   norepinephrine (LEVOPHED) Adult infusion 26 mcg/min (10/30/21 0400)   promethazine (PHENERGAN) injection (IM or IVPB)     propofol (DIPRIVAN) infusion 25 mcg/kg/min (10/30/21 0400)   vasopressin 0.04 Units/min (10/30/21 0400)    Assessment: 65 year old male with chronic systolic heart failure s/p ICD, s/p CABG, COPD admitted for acute hypoxemic respiratory failure secondary to influenza A. Anticoagulation for new onset atrial fibrillation.  Last dose of enoxaparin 12/2  @2200  No documented signs/symptoms of bleed Continues in afib  12/4 AM update:  Heparin level is >1.1 Renal function worsening (2.6>3.3>4.6) Likely still having therapeutic effect from Lovenox   Goal of Therapy:  Heparin level 0.3-0.7 units/ml Monitor platelets by anticoagulation protocol: Yes   Plan:  Hold heparin  Re-check anti-Xa level in 6 hours  Re-start heparin as able based on anti-Xa level  , PharmD, BCPS Clinical Pharmacist Phone: [phone]

## 2021-10-31 ENCOUNTER — Inpatient Hospital Stay (HOSPITAL_COMMUNITY): Payer: Medicare HMO

## 2021-10-31 DIAGNOSIS — J9601 Acute respiratory failure with hypoxia: Secondary | ICD-10-CM | POA: Diagnosis not present

## 2021-10-31 DIAGNOSIS — J9602 Acute respiratory failure with hypercapnia: Secondary | ICD-10-CM | POA: Diagnosis not present

## 2021-10-31 DIAGNOSIS — J9621 Acute and chronic respiratory failure with hypoxia: Secondary | ICD-10-CM | POA: Diagnosis not present

## 2021-10-31 LAB — URINALYSIS, ROUTINE W REFLEX MICROSCOPIC
Bilirubin Urine: NEGATIVE
Glucose, UA: 50 mg/dL — AB
Ketones, ur: NEGATIVE mg/dL
Nitrite: NEGATIVE
Protein, ur: NEGATIVE mg/dL
Specific Gravity, Urine: 1.01 (ref 1.005–1.030)
pH: 5 (ref 5.0–8.0)

## 2021-10-31 LAB — CBC
HCT: 29.6 % — ABNORMAL LOW (ref 39.0–52.0)
Hemoglobin: 9.9 g/dL — ABNORMAL LOW (ref 13.0–17.0)
MCH: 28.8 pg (ref 26.0–34.0)
MCHC: 33.4 g/dL (ref 30.0–36.0)
MCV: 86 fL (ref 80.0–100.0)
Platelets: 165 10*3/uL (ref 150–400)
RBC: 3.44 MIL/uL — ABNORMAL LOW (ref 4.22–5.81)
RDW: 13.6 % (ref 11.5–15.5)
WBC: 17.8 10*3/uL — ABNORMAL HIGH (ref 4.0–10.5)
nRBC: 0 % (ref 0.0–0.2)

## 2021-10-31 LAB — RENAL FUNCTION PANEL
Albumin: 1.8 g/dL — ABNORMAL LOW (ref 3.5–5.0)
Anion gap: 15 (ref 5–15)
BUN: 126 mg/dL — ABNORMAL HIGH (ref 8–23)
CO2: 18 mmol/L — ABNORMAL LOW (ref 22–32)
Calcium: 7.5 mg/dL — ABNORMAL LOW (ref 8.9–10.3)
Chloride: 99 mmol/L (ref 98–111)
Creatinine, Ser: 5.93 mg/dL — ABNORMAL HIGH (ref 0.61–1.24)
GFR, Estimated: 10 mL/min — ABNORMAL LOW (ref >60)
Glucose, Bld: 243 mg/dL — ABNORMAL HIGH (ref 70–99)
Phosphorus: 7.5 mg/dL — ABNORMAL HIGH (ref 2.5–4.6)
Potassium: 4.6 mmol/L (ref 3.5–5.1)
Sodium: 132 mmol/L — ABNORMAL LOW (ref 135–145)

## 2021-10-31 LAB — GLUCOSE, CAPILLARY
Glucose-Capillary: 207 mg/dL — ABNORMAL HIGH (ref 70–99)
Glucose-Capillary: 221 mg/dL — ABNORMAL HIGH (ref 70–99)
Glucose-Capillary: 236 mg/dL — ABNORMAL HIGH (ref 70–99)
Glucose-Capillary: 242 mg/dL — ABNORMAL HIGH (ref 70–99)
Glucose-Capillary: 252 mg/dL — ABNORMAL HIGH (ref 70–99)
Glucose-Capillary: 275 mg/dL — ABNORMAL HIGH (ref 70–99)
Glucose-Capillary: 277 mg/dL — ABNORMAL HIGH (ref 70–99)

## 2021-10-31 LAB — CULTURE, RESPIRATORY W GRAM STAIN: Culture: NO GROWTH

## 2021-10-31 LAB — BASIC METABOLIC PANEL
Anion gap: 13 (ref 5–15)
BUN: 122 mg/dL — ABNORMAL HIGH (ref 8–23)
CO2: 18 mmol/L — ABNORMAL LOW (ref 22–32)
Calcium: 7.6 mg/dL — ABNORMAL LOW (ref 8.9–10.3)
Chloride: 100 mmol/L (ref 98–111)
Creatinine, Ser: 5.86 mg/dL — ABNORMAL HIGH (ref 0.61–1.24)
GFR, Estimated: 10 mL/min — ABNORMAL LOW (ref >60)
Glucose, Bld: 254 mg/dL — ABNORMAL HIGH (ref 70–99)
Potassium: 4.7 mmol/L (ref 3.5–5.1)
Sodium: 131 mmol/L — ABNORMAL LOW (ref 135–145)

## 2021-10-31 LAB — HEPARIN LEVEL (UNFRACTIONATED)
Heparin Unfractionated: 0.46 IU/mL (ref 0.30–0.70)
Heparin Unfractionated: 0.82 IU/mL — ABNORMAL HIGH (ref 0.30–0.70)

## 2021-10-31 MED ORDER — B COMPLEX-C PO TABS
1.0000 | ORAL_TABLET | Freq: Every day | ORAL | Status: DC
  Administered 2021-10-31 – 2021-11-14 (×15): 1
  Filled 2021-10-31 (×16): qty 1

## 2021-10-31 MED ORDER — VITAL 1.5 CAL PO LIQD
1000.0000 mL | ORAL | Status: DC
  Administered 2021-10-31 – 2021-11-08 (×9): 1000 mL
  Filled 2021-10-31 (×3): qty 1000

## 2021-10-31 MED ORDER — INSULIN GLARGINE-YFGN 100 UNIT/ML ~~LOC~~ SOLN
15.0000 [IU] | Freq: Two times a day (BID) | SUBCUTANEOUS | Status: DC
  Administered 2021-10-31 – 2021-11-02 (×6): 15 [IU] via SUBCUTANEOUS
  Filled 2021-10-31 (×8): qty 0.15

## 2021-10-31 MED ORDER — PRISMASOL BGK 0/2.5 32-2.5 MEQ/L EC SOLN
Status: DC
  Filled 2021-10-31 (×11): qty 5000

## 2021-10-31 MED ORDER — HEPARIN SODIUM (PORCINE) 1000 UNIT/ML DIALYSIS
1000.0000 [IU] | INTRAMUSCULAR | Status: DC | PRN
  Administered 2021-10-31: 2800 [IU] via INTRAVENOUS_CENTRAL
  Administered 2021-11-05: 3000 [IU] via INTRAVENOUS_CENTRAL
  Administered 2021-11-06: 2800 [IU] via INTRAVENOUS_CENTRAL
  Filled 2021-10-31: qty 3
  Filled 2021-10-31: qty 6
  Filled 2021-10-31: qty 5
  Filled 2021-10-31 (×4): qty 6

## 2021-10-31 MED ORDER — PRISMASOL BGK 4/2.5 32-4-2.5 MEQ/L REPLACEMENT SOLN
Status: DC
  Filled 2021-10-31 (×17): qty 5000

## 2021-10-31 MED ORDER — SORBITOL 70 % SOLN
30.0000 mL | Freq: Once | Status: AC
  Administered 2021-10-31: 30 mL
  Filled 2021-10-31: qty 30

## 2021-10-31 MED ORDER — PROSOURCE TF PO LIQD
90.0000 mL | Freq: Four times a day (QID) | ORAL | Status: DC
  Administered 2021-10-31 – 2021-11-08 (×32): 90 mL
  Filled 2021-10-31 (×31): qty 90

## 2021-10-31 MED ORDER — PRISMASOL BGK 0/2.5 32-2.5 MEQ/L EC SOLN
Status: DC
  Filled 2021-10-31 (×52): qty 5000

## 2021-10-31 MED ORDER — SODIUM CHLORIDE 0.9 % IV SOLN
2.0000 g | Freq: Two times a day (BID) | INTRAVENOUS | Status: AC
  Administered 2021-10-31 – 2021-11-06 (×13): 2 g via INTRAVENOUS
  Filled 2021-10-31 (×13): qty 2

## 2021-10-31 MED ORDER — ASCORBIC ACID 500 MG PO TABS
250.0000 mg | ORAL_TABLET | Freq: Two times a day (BID) | ORAL | Status: DC
  Administered 2021-10-31 – 2021-11-14 (×29): 250 mg
  Filled 2021-10-31 (×30): qty 1

## 2021-10-31 MED ORDER — SENNOSIDES-DOCUSATE SODIUM 8.6-50 MG PO TABS
1.0000 | ORAL_TABLET | Freq: Two times a day (BID) | ORAL | Status: DC
  Administered 2021-10-31 – 2021-11-01 (×3): 1
  Filled 2021-10-31 (×3): qty 1

## 2021-10-31 NOTE — Procedures (Signed)
Central Venous Catheter Insertion Procedure Note  Joseph [name]  [id number]  10/20/1956  Date:10/31/21  Time:12:30 PM   Provider Performing:Sherrol Vicars Wilford Grist   Procedure: Insertion of Non-tunneled Central Venous Catheter(36556)with US guidance (62694)    Indication(s) Hemodialysis  Consent Risks of the procedure as well as the alternatives and risks of each were explained to the patient and/or caregiver.  Consent for the procedure was obtained and is signed in the bedside chart  Anesthesia Fentanyl drip   Timeout Verified patient identification, verified procedure, site/side was marked, verified correct patient position, special equipment/implants available, medications/allergies/relevant history reviewed, required imaging and test results available.  Sterile Technique Maximal sterile technique including full sterile barrier drape, hand hygiene, sterile gown, sterile gloves, mask, hair covering, sterile ultrasound probe cover (if used).  Procedure Description Area of catheter insertion was cleaned with chlorhexidine and draped in sterile fashion.   With real-time ultrasound guidance a HD catheter was placed into the right internal jugular vein.  Nonpulsatile blood flow and easy flushing noted in all ports.  The catheter was sutured in place and sterile dressing applied.  Complications/Tolerance None; patient tolerated the procedure well. Chest X-ray is ordered to verify placement for internal jugular or subclavian cannulation.  Chest x-ray is not ordered for femoral cannulation.  EBL Minimal  Specimen(s) None

## 2021-10-31 NOTE — Progress Notes (Signed)
eLink Physician-Brief Progress Note Patient Name: Joseph [name] DOB: 06/04/1956 MRN: [id number]   Date of Service  10/31/2021  HPI/Events of Note  Patient with multiple watery stools.  eICU Interventions  Flexiseal ordered.        Thomasene Lot [name] 10/31/2021, 10:38 PM

## 2021-10-31 NOTE — Plan of Care (Signed)
  Problem: Clinical Measurements: Goal: Ability to maintain clinical measurements within normal limits will improve Outcome: Progressing Goal: Will remain free from infection Outcome: Progressing Goal: Respiratory complications will improve Outcome: Progressing Goal: Cardiovascular complication will be avoided Outcome: Progressing   Problem: Activity: Goal: Risk for activity intolerance will decrease Outcome: Progressing   Problem: Nutrition: Goal: Adequate nutrition will be maintained Outcome: Progressing   Problem: Elimination: Goal: Will not experience complications related to bowel motility Outcome: Progressing Goal: Will not experience complications related to urinary retention Outcome: Progressing   Problem: Pain Managment: Goal: General experience of comfort will improve Outcome: Progressing   Problem: Safety: Goal: Ability to remain free from injury will improve Outcome: Progressing   Problem: Skin Integrity: Goal: Risk for impaired skin integrity will decrease Outcome: Progressing

## 2021-10-31 NOTE — TOC Initial Note (Signed)
Transition of Care [facility]) - Initial/Assessment Note    Patient Details  Name: Joseph [name] MRN: [id number] Date of Birth: 05-Mar-1956  Transition of Care [facility]) CM/SW Contact:    Tom-Johnson, Hershal Coria, RN Phone Number: 10/31/2021, 5:07 PM  Clinical Narrative:                 CM called and spoke with patient's daughter, Joseph [name] ([phone]) about needs for post hospital transition. Patient is not able to assess due to him being on vent. Patient is admitted for acute respiratory failure and positive for Flu. [name] states that patient lives at home with his wife and was in good condition prior to hospitalization. 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 CM notified [name] of consult for [facility] eventually. [name] voiced understanding but would like to see how much patient improves before deciding on LTACH. She would prefer him go home with home health. [name] understands patient is still intubated and on CRRT and LTACH will not accept until they are extubated and off CRRT. Non tunneled HD cath placed today. [name] with [facility] was in progression this morning and looking at patient's progress for possible admission. Medical workup continues. CM will follow with needs.      Expected Discharge Plan: Long Term Acute Care (LTAC) Barriers to Discharge: Continued Medical Work up   Patient Goals and CMS Choice   CMS Medicare.gov Compare Post Acute Care list provided to:: Other (Comment Required) (Daughter, Raechel Ache.)    Expected Discharge Plan and Services Expected Discharge Plan: Long Term Acute Care (LTAC)   Discharge Planning Services: CM Consult   Living arrangements for the past 2 months: Apartment                                      Prior Living Arrangements/Services Living arrangements for the past 2 months: Apartment Lives with:: Spouse Patient language and need for interpreter reviewed:: Yes         Need for Family Participation in Patient Care: Yes (Comment) Care giver support system in place?: Yes (comment)   Criminal Activity/Legal Involvement Pertinent to Current Situation/Hospitalization: No - Comment as needed  Activities of Daily Living Home Assistive Devices/Equipment: None ADL Screening (condition at time of admission) Patient's cognitive ability adequate to safely complete daily activities?: Yes Is the patient deaf or have difficulty hearing?: No Does the patient have difficulty seeing, even when wearing glasses/contacts?: No Does the patient have difficulty concentrating, remembering, or making decisions?: No Patient able to express need for assistance with ADLs?: Yes Does the patient have difficulty dressing or bathing?: No Independently performs ADLs?: Yes (appropriate for developmental age) Does the patient have difficulty walking or climbing stairs?: No Weakness of Legs: None Weakness of Arms/Hands: None  Permission Sought/Granted Permission sought to share information with : Case Manager, Magazine features editor, Family Supports Permission granted to share information with : Yes, Verbal Permission Granted              Emotional Assessment Appearance:: Appears stated age Attitude/Demeanor/Rapport: Unable to Assess Affect (typically observed): Unable to Assess Orientation: : Oriented to Self Alcohol / Substance Use: Not Applicable Psych Involvement: No (comment)  Admission diagnosis:  Influenza A [J10.1] Acute on chronic respiratory failure with hypoxia (HCC) [J96.21] Acute respiratory failure with hypoxia and hypercapnia (HCC) [J96.01, J96.02] Patient Active Problem List   Diagnosis Date  Noted   Atrial fibrillation with RVR ([place])    Pressure injury of skin 10/25/2021   Acute respiratory failure with hypoxia and hypercapnia ([place]) 10/05/2021   Influenza A 10/16/2021   COPD with acute exacerbation ([place]) 10/03/2021   Acute-on-chronic kidney  injury ([place]) 10/07/2021   Pneumonia 10/12/2021   Anemia 01/06/2019   Controlled diabetes mellitus with stage 3 chronic kidney disease ([place]) 08/15/2017   Hypertension associated with diabetes ([place])    Arteriosclerosis of coronary artery 08/05/2014   Acid reflux 08/05/2014   Hx of CABG [id number]   Chronic systolic heart failure ([place]) 05/02/2011   LAD stenosis    Coronary artery disease    Automatic implantable cardioverter-defibrillator in situ 01/12/2010   Other specified forms of chronic ischemic heart disease 11/03/2009   Cardiomyopathy, ischemic 11/03/2009   Hyperlipidemia associated with type 2 diabetes mellitus ([place]) 02/02/2009   PCP:  [name], [name], MD Pharmacy:  No Pharmacies Listed    Social Determinants of Health (SDOH) Interventions    Readmission Risk Interventions No flowsheet data found.

## 2021-10-31 NOTE — Progress Notes (Signed)
NAME:  Joseph [name], MRN:  [id number], DOB:  [birth year], LOS: 7 ADMISSION DATE:  Nov 09, 2021, CONSULTATION DATE:  10/28/2021 REFERRING MD:  Dr. Arbutus Leas, CHIEF COMPLAINT:  Acute hypoxic respiratory failure    History of Present Illness:  Joseph [name] is a 65 y.o. male with a PMH significant for CHF, COPD, CAD s/p CABG, HTN, HLD, MRSA bacteremia, and prior GI bleed who presented to the ED for acute respiratory distress.   On ED arrival patient was placed on BIPAP for significant hypoxia. Workup revealed patient was Flu A positive with underlying COPD exacerbation and likely CHF exacerbation with component of pulmonary edema as well. He was treated with IV diuretics, Tamaflu, CAP coverage, Amiodarone drip with beta blocker for persistent A-fib, and steroids with limited improvement in respiratory status leading to the decision to intubated and transfer to [facility] for further pulmonary and cardiac care   Pertinent  Medical History  CHF, COPD, CAD s/p CABG, HTN, HLD, MRSA bacteremia, and prior GI bleed  Significant Hospital Events: Including procedures, antibiotic start and stop dates in addition to other pertinent events   11/28 admitted at [facility] for acute hypoxic respiratory failure  12/2 intubated and [facility]   Interim History / Subjective:  12/5: amio, norepi and vaso remain on. Appreciate HF assistance. Remains ventilated. BS elevated in 200's. BUN now >120. However uop is up yesterday with 320mg  lasix. Will call nephro to eval for crrt.   Objective   Blood pressure (!) 115/51, pulse (!) 136, temperature 99.1 F (37.3 C), temperature source Oral, resp. rate 16, height 5\' 11"  (1.803 m), weight 90.4 kg, SpO2 90 %. CVP:  [8 mmHg-23 mmHg] 13 mmHg  Vent Mode: PRVC FiO2 (%):  [40 %-60 %] 40 % Set Rate:  [15 bmp] 15 bmp Vt Set:  [600 mL] 600 mL PEEP:  [5 cmH20-8 cmH20] 5 cmH20 Plateau Pressure:  [18 cmH20-21 cmH20] 18 cmH20   Intake/Output Summary (Last 24 hours) at  10/31/2021 Last data filed at 10/31/2021 6734 Gross per 24 hour  Intake 2610.67 ml  Output 825 ml  Net 1785.67 ml   Filed Weights   10/29/21 0427 10/30/21 0419 10/31/21 0535  Weight: 84 kg 86.8 kg 90.4 kg    Examination: General appearance: 65 y.o., male, intubated, sedated, unresponsive  Eyes: anicteric sclerae; PERRL, conjunctiva injected HENT: NCAT; MMM Lungs: coarse rhonchi bilaterally CV: tachy IRIR, no murmur  Abdomen: Soft, non-tender; non-distended, BS present  Extremities: + edema warm Skin: Normal turgor and texture; no rash Neuro: deeply sedated, grossly nonfocal    Cxr with bilateral infiltrates vs worsening edema.   Resolved Hospital Problem list     Assessment & Plan:  # Acute Hypoxic and Hypercapnic Respiratory Failure with moderate ARDS  # Flu A positive  # Acute COPD exacerbation  Some volume overload still to be dealt with but I think he does have ARDS in setting of flu, possible superimposed HAP.  P: - Lung protective ventilation - Follow cultures: ngtd - VAP bundle in place  - PAD protocol - cefepime: day 4 - S/p course of tamiflu - Continue BDs - IV steroids for shock as below - Keep RASS -2   # Shock Distributive based on coox and warm BLE ext on exam. ?Septic and effect of sedation. -coox 84.2  12/4 - wean levo for MAP 65 - vaso 0.03 - stress dose steroids 12/3-  # Acute renal failure, oliguric -Baseline creatinine 1.4-1.6 with admission  creatinine 2.01 P: -appears volume up. Consulting renal -diuresis yesterday given with slight increase in uop but certain increase in indices -BUN too elevated at this time and fear if cont to climb could compromise neuro exam and ability to extubate if not already Avoid nephrotoxins Ensure adequate renal perfusion   Hx of HFrEF with ischemic cardiomyopathy  -EF 35-40% Persistent A-Flutter/A-fib with RVR -CHA2DS2-VASc score 4 Elevated troponin with hx of CAD -s/p CABG 2000 P: - Cardiology  following  - Heparin gtt :held at this time for vas cath procedure - amiodarone gtt - hold Beta Blocker, in shock - Strict intake and output  - Daily weight to assess volume status  Type 2 diabetes  -Home medication included Jardiance, Hgb A1C 5.6 P: -SSI, will increase scale today with elevated bs. Perhaps could stand to add on long acting insulin as well CBG goal 140-180  Stage 1 coccyx pressure injury P: Pressure alleviating devices  Local care    Best Practice (right click and "Reselect all SmartList Selections" daily)   Diet/type: tubefeeds DVT prophylaxis: systemic heparin GI prophylaxis: PPI Lines: N/A Foley:  N/A Code Status:  full code Last date of multidisciplinary goals of care discussion: Pending    Critical care time: The patient is critically ill with multiple organ systems failure and requires high complexity decision making for assessment and support, frequent evaluation and titration of therapies, application of advanced monitoring technologies and extensive interpretation of multiple databases.  Critical care time 39 mins. This represents my time independent of the NPs time taking care of the pt. This is excluding procedures.    Audria Nine DO [facility] 10/31/2021, 8:24 AM See Amion for pager If no response to pager, please call [phone] until 1900 After 1900 please call [facility] [phone]

## 2021-10-31 NOTE — Progress Notes (Signed)
Pharmacy Antibiotic Note  Joseph [name] is a 65 y.o. male admitted to [facility] on 30-Oct-2021 with difficulty breathing.  Patient has been on Rocephin and doxycycline for CAP and Tamiflu for Flu A.  With deterioration, patient was intubated and transferred to [facility].  Pharmacy has been consulted for cefepime dosing.  Patient has an AKI - SCr 1.8 > 5.86 and to start CRRT today 12/5.  Afebrile, WBC down to 17.8.  Plan: Change cefepime to 2gm IV Q12H Monitor CRRT tolerance/interruption, clinical progress   Height: 5\' 11"  (180.3 cm) Weight: 90.4 kg (199 lb 4.7 oz) IBW/kg (Calculated) : 75.3  Temp (24hrs), Avg:98.5 F (36.9 C), Min:98.2 F (36.8 C), Max:99.1 F (37.3 C)  Recent Labs  Lab 10-30-21 1828 30-Oct-2021 2029 10/25/21 0121 10/27/21 0432 10/28/21 0416 10/29/21 0514 10/30/21 0415 10/31/21 0455  WBC 11.8*  --    < > 17.1* 28.4* 13.7* 19.9* 17.8*  CREATININE 2.01*  --    < > 1.98* 2.60* 3.31* 4.66* 5.86*  LATICACIDVEN 4.5* 2.4*  --   --   --   --   --   --    < > = values in this interval not displayed.     Estimated Creatinine Clearance: 14.1 mL/min (A) (by C-G formula based on SCr of 5.86 mg/dL (H)).    Allergies  Allergen Reactions   Entresto [Sacubitril-Valsartan]     Worsening renal function and weight gain    Doxy 11/29 >> 12/2 CTX 11/29 >> 12/2 Vanc 12/2 >> 12/3 Cefepime 12/2 >> Tamiflu 11/28 >>12/3   11/28 BCx - negative 11/28 Flu A - positive 11/29 MRSA PCR - negative 12/2 TA - NGTD  Joseph [name] D. 14/2, PharmD, BCPS, BCCCP 10/31/2021, 11:39 AM

## 2021-10-31 NOTE — Progress Notes (Signed)
Nutrition Follow-up  DOCUMENTATION CODES:   Not applicable  INTERVENTION:   Continue tube feeding via OG tube: - Change to Vital 1.5 @ 40 ml/hr (960 ml/day) - ProSource TF 90 ml QID  Tube feeding regimen provides 1760 kcal, 153 grams of protein, and 733 ml of H2O.   Tube feeding regimen and current propofol provides 2156 total kcal (100% of needs).  - B-complex with vitamin C daily and vitamin C 250 mg BID to account for losses with CRRT  NUTRITION DIAGNOSIS:   Inadequate oral intake related to inability to eat as evidenced by NPO status.  Ongoing, being addressed via TF   GOAL:   Patient will meet greater than or equal to 90% of their needs  Met via TF  MONITOR:   Vent status, TF tolerance, Skin, Weight trends, Labs, I & O's  REASON FOR ASSESSMENT:   Consult, Ventilator Enteral/tube feeding initiation and management  ASSESSMENT:   Patient is a 65 yo male with hx of CHF, COPD, CAD, CKD-3a who presented with acute respiratory distress, positive for influenza A and AKI.  12/02 - intubated, transferred to [facility] 12/05 - CRRT start  Discussed pt with RN and during ICU rounds. Pt with septic/distributive shock and PNA/ARDS. AKI worsening and pt starting on CRRT today. Pt tolerating current TF at goal rate. OG tube in stomach per abdominal x-ray on 12/02. Discussed plan to switch to more concentrated TF formula with RN. Plan is to pull fluid with CRRT.  Admit weight: 80.5 kg (will use as EDW) Current weight: 90.4 kg  Pt with non-pitting edema to BUE and mild pitting edema to BLE.  Current TF: Vital AF 1.2 @ 50 ml/hr, ProSource TF 45 ml TID  Patient is currently intubated on ventilator support MV: 11.1 L/min Temp (24hrs), Avg:98.5 F (36.9 C), Min:98.2 F (36.8 C), Max:99.1 F (37.3 C)  Drips: Propofol: 15 ml/hr (provides 396 kcal daily from lipid) Fentanyl Heparin Amiodarone Levophed Vasopressin  Medications reviewed and include: IV solu-cortef,  SSI q 4  hours, semglee 15 units BID, protonix, miralax, senna, sodium bicarb 650 mg TID, IV abx  Labs reviewed: sodium 131, BUN 122, creatinine 5.86, phosphorus 6.9 on 12/04, magnesium 2.7 on 12/04, hemoglobin 9.9 CBG's: 214-252 x 24 hours  UOP: 825 ml x 24 hours I/O's: +5.9 L since admit  NUTRITION - FOCUSED PHYSICAL EXAM:  Unable to complete at this time. Pt undergoing sterile procedure.  Diet Order:   Diet Order             Diet NPO time specified  Diet effective now                   EDUCATION NEEDS:   Not appropriate for education at this time  Skin:  Skin Assessment: Skin Integrity Issues: Stage I: coccyx Stage II: nose due to BiPAP  Last BM:  10/12/2021  Height:   Ht Readings from Last 1 Encounters:  10/28/21 _0  (1.803 m)    Weight:   Wt Readings from Last 1 Encounters:  10/31/21 90.4 kg    BMI:  Body mass index is 27.8 kg/m.  Estimated Nutritional Needs:   Kcal:  2000-2200  Protein:  150-170 grams  Fluid:  >/= 2.0 L    Gustavus Bryant, MS, RD, LDN Inpatient Clinical Dietitian Please see AMiON for contact information.

## 2021-10-31 NOTE — Progress Notes (Signed)
ANTICOAGULATION CONSULT NOTE - Follow Up Consult  Pharmacy Consult for Heparin drip Indication: atrial fibrillation  Allergies  Allergen Reactions   Entresto [Sacubitril-Valsartan]     Worsening renal function and weight gain    Patient Measurements: Height: 5\' 11"  (180.3 cm) Weight: 90.4 kg (199 lb 4.7 oz) IBW/kg (Calculated) : 75.3 Heparin Dosing Weight: 80.3 kg   Vital Signs: Temp: 95.5 F (35.3 C) (12/05 2300) Temp Source: Esophageal (12/05 2200) BP: 117/60 (12/05 2200) Pulse Rate: 70 (12/05 2300)  Labs: Recent Labs    10/29/21 0514 10/29/21 1404 10/30/21 0203 10/30/21 0415 10/30/21 1142 10/30/21 2225 10/31/21 0455 10/31/21 1547 10/31/21 2117  HGB 13.1   < > 12.6* 12.1*  --   --  9.9*  --   --   HCT 39.2   < > 37.0* 36.3*  --   --  29.6*  --   --   PLT 137*  --   --  254  --   --  165  --   --   HEPARINUNFRC  --   --   --  >1.10*   < > 0.90* 0.46  --  0.82*  CREATININE 3.31*  --   --  4.66*  --   --  5.86* 5.93*  --    < > = values in this interval not displayed.     Estimated Creatinine Clearance: 13.9 mL/min (A) (by C-G formula based on SCr of 5.93 mg/dL (H)).   Medications:  Infusions:    prismasol BGK 4/2.5 500 mL/hr at 10/31/21 1440   sodium chloride 10 mL/hr at 10/31/21 2300   amiodarone 30 mg/hr (10/31/21 2300)   ceFEPime (MAXIPIME) IV Stopped (10/31/21 1633)   feeding supplement (VITAL 1.5 CAL) 1,000 mL (10/31/21 1449)   fentaNYL infusion INTRAVENOUS 100 mcg/hr (10/31/21 2300)   heparin 1,000 Units/hr (10/31/21 2300)   norepinephrine (LEVOPHED) Adult infusion 15 mcg/min (10/31/21 2300)   prismasol BGK 2/2.5 dialysis solution 1,500 mL/hr at 10/31/21 2159   prismasol BGK 2/2.5 replacement solution 300 mL/hr at 10/31/21 1442   promethazine (PHENERGAN) injection (IM or IVPB)     propofol (DIPRIVAN) infusion 30 mcg/kg/min (10/31/21 2300)   vasopressin 0.03 Units/min (10/31/21 2300)    Assessment: 65 year old male with chronic systolic heart  failure s/p ICD, s/p CABG, COPD admitted for acute hypoxemic respiratory failure secondary to influenza A. Anticoagulation for new onset atrial fibrillation.  Last dose of enoxaparin 12/2 @2200  No documented signs/symptoms of bleed Continues in afib  12/5 PM update:  Heparin level supra-therapeutic   Goal of Therapy:  Heparin level 0.3-0.7 units/ml Monitor platelets by anticoagulation protocol: Yes   Plan:  Dec heparin to 850 units/hr Re-check heparin level with AM labs  , PharmD, BCPS Clinical Pharmacist Phone: [phone]

## 2021-10-31 NOTE — Progress Notes (Addendum)
Advanced Heart Failure Rounding Note  PCP-Cardiologist: Minus Breeding, MD   Subjective:    Remains intubated and sedated. FiO2 40%. PEEP 5.  WBC down 19>>17K. mTemp overnight 99.1   Only 825 cc in UOP despite high dose IV Lasix yesterday. SCr continues to rise, 3.31>>4.66>>5.86.   Na 131 K 4.7   N NE 14 + VP 0.04. MAP 73.   CVP 13-14. Co-ox pending   Remains in Afib, 120s. Amio gtt at 30/hr    Objective:   Weight Range: 90.4 kg Body mass index is 27.8 kg/m.   Vital Signs:   Temp:  [98.2 F (36.8 C)-99.1 F (37.3 C)] 99.1 F (37.3 C) (12/05 0710) Pulse Rate:  [81-151] 105 (12/05 0600) Resp:  [11-21] 14 (12/05 0600) BP: (122-157)/(51-88) 124/66 (12/05 0600) SpO2:  [90 %-100 %] 93 % (12/05 0600) Arterial Line BP: (103-168)/(45-77) 107/51 (12/05 0600) FiO2 (%):  [50 %-70 %] 50 % (12/05 0312) Weight:  [90.4 kg] 90.4 kg (12/05 0535) Last BM Date: 10/01/2021  Weight change: Filed Weights   10/29/21 0427 10/30/21 0419 10/31/21 0535  Weight: 84 kg 86.8 kg 90.4 kg    Intake/Output:   Intake/Output Summary (Last 24 hours) at 10/31/2021 0726 Last data filed at 10/31/2021 0618 Gross per 24 hour  Intake 2843.1 ml  Output 825 ml  Net 2018.1 ml      Physical Exam    CVP 13-14 General:  critically ill, intubated and sedated.  HEENT: Normal +ETT Neck: Supple. JVP 14 cm . Carotids 2+ bilat; no bruits. No lymphadenopathy or thyromegaly appreciated. Cor: PMI nondisplaced. Irregularly irregular rhythm. No rubs, gallops or murmurs. Lungs: Intubated and Clear Abdomen: Soft, nontender, mildly distended. No hepatosplenomegaly. No bruits or masses. Hypoactive bowel sounds. Extremities: No cyanosis, clubbing, rash, trace bilateral LE edema Neuro: Intubated and sedated GU: + Foley    Telemetry   Afib w/ RVR 120s, NSVT x1 (8 beats)   EKG    No new EKG to review    Labs    CBC Recent Labs    10/30/21 0415 10/31/21 0455  WBC 19.9* 17.8*  HGB 12.1* 9.9*   HCT 36.3* 29.6*  MCV 85.8 86.0  PLT 254 939   Basic Metabolic Panel Recent Labs    10/29/21 1710 10/29/21 1828 10/30/21 0415 10/31/21 0455  NA  --    < > 130* 131*  K  --    < > 4.6 4.7  CL  --   --  97* 100  CO2  --   --  17* 18*  GLUCOSE  --   --  257* 254*  BUN  --   --  95* 122*  CREATININE  --   --  4.66* 5.86*  CALCIUM  --   --  8.0* 7.6*  MG 2.8*  --  2.7*  --   PHOS 7.4*  --  6.9*  --    < > = values in this interval not displayed.   Liver Function Tests No results for input(s): AST, ALT, ALKPHOS, BILITOT, PROT, ALBUMIN in the last 72 hours. No results for input(s): LIPASE, AMYLASE in the last 72 hours. Cardiac Enzymes No results for input(s): CKTOTAL, CKMB, CKMBINDEX, TROPONINI in the last 72 hours.  BNP: BNP (last 3 results) Recent Labs    10/04/2021 1909 10/25/21 1945 10/28/21 1746  BNP 243.0* 383.0* 377.2*    ProBNP (last 3 results) No results for input(s): PROBNP in the last 8760 hours.   D-Dimer No  results for input(s): DDIMER in the last 72 hours. Hemoglobin A1C No results for input(s): HGBA1C in the last 72 hours. Fasting Lipid Panel Recent Labs    10/30/21 0415  TRIG 136   Thyroid Function Tests No results for input(s): TSH, T4TOTAL, T3FREE, THYROIDAB in the last 72 hours.  Invalid input(s): FREET3  Other results:   Imaging    DG Chest Port 1V same Day  Result Date: 10/30/2021 CLINICAL DATA:  Respiratory failure EXAM: PORTABLE CHEST 1 VIEW COMPARISON:  10/29/2021 FINDINGS: Endotracheal tube is seen 5.3 cm above the carina. Nasogastric tube extends into the upper abdomen beyond the margin of the examination. Left internal jugular central venous catheter tip noted within the superior vena caval confluence. Pulmonary insufflation is stable of the lungs are symmetrically well expanded. Superimposed extensive patchy airspace and interstitial infiltrate is stable. Small left pleural effusion is unchanged. No pneumothorax. Coronary artery  bypass grafting has been performed. Cardiac size within normal limits. 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 No acute bone abnormality. IMPRESSION: Stable support lines and tubes. Stable pulmonary insufflation. Stable extensive multifocal pulmonary infiltrate, edema versus infection. Stable small left pleural effusion. Electronically Signed   By: Fidela Salisbury M.D.   On: 10/30/2021 20:40     Medications:     Scheduled Medications:  arformoterol  15 mcg Nebulization BID   aspirin  81 mg Per Tube Daily   atorvastatin  80 mg Per Tube QPM   budesonide (PULMICORT) nebulizer solution  0.5 mg Nebulization BID   chlorhexidine gluconate (MEDLINE KIT)  15 mL Mouth Rinse BID   Chlorhexidine Gluconate Cloth  6 each Topical Daily   docusate  100 mg Per Tube BID   feeding supplement (PROSource TF)  45 mL Per Tube TID   hydrocortisone sod succinate (SOLU-CORTEF) inj  100 mg Intravenous Q8H   insulin aspart  0-15 Units Subcutaneous Q4H   insulin glargine-yfgn  5 Units Subcutaneous QHS   ipratropium  0.5 mg Nebulization TID   mouth rinse  15 mL Mouth Rinse 10 times per day   pantoprazole sodium  40 mg Per Tube Daily   polyethylene glycol  17 g Per Tube Daily   sodium bicarbonate  650 mg Per Tube TID   sodium chloride flush  10-40 mL Intracatheter Q12H    Infusions:  sodium chloride 10 mL/hr at 10/31/21 0618   amiodarone 30 mg/hr (10/31/21 0618)   ceFEPime (MAXIPIME) IV Stopped (10/30/21 1708)   feeding supplement (VITAL AF 1.2 CAL) 1,000 mL (10/30/21 1541)   fentaNYL infusion INTRAVENOUS 100 mcg/hr (10/31/21 0618)   furosemide Stopped (10/30/21 1809)   heparin 1,000 Units/hr (10/31/21 0400)   norepinephrine (LEVOPHED) Adult infusion 13 mcg/min (10/31/21 0618)   promethazine (PHENERGAN) injection (IM or IVPB)     propofol (DIPRIVAN) infusion 20 mcg/kg/min (10/31/21 0618)   vasopressin 0.04 Units/min (10/31/21 0618)    PRN Medications: acetaminophen **OR** acetaminophen,  albuterol, fentaNYL, polyethylene glycol, promethazine (PHENERGAN) injection (IM or IVPB), sodium chloride flush    Patient Profile   65 y/o male w/ CAD, chronic systolic heart failure and CKD IIIb, admitted w/ Influenza A, sepsis PNA, acute hypoxic respiratory failure and AKI on CKD.   Assessment/Plan   1. Septic/distributive shock - Initial Co-ox 84% - can wean NE as tolerated. Continue VP - Continue cefepime and stress-dose steroids - he is volume replete/overloaded   2. Acute hypoxic respiratory failure - has PNA/ARDS with diffuse lung injury on CXR - continue vent support - poor response to  diuretics, may need [employer] for volume removal    3. Acute on chronic systolic HF due to iCM - EF stable 35-40% on echo - Volume overloaded in setting of fluid resuscitation for sepsis - CVP 13-14  - poor response to high dose IV diuretics, may need [place] for volume removal  - Initial Co-ox 84%. Will follow  - Off GDMT due to shock/AKI   4. PAF with RVR - continue amio gtt and heparin   5. AKI on CKD 3b due to ATN/shock - baseline Scr 1.5-2.0. Now up to 5.86 - still making urine - renal u/s with medico-renal disease in R>L kidney - Keep MAP > 70 - Family ok with CVVHD if needed. Consider renal consult today    7. CAD with elevated trop due to demand ischemia - stable.  - continue ASA/statin - doubt ischemia is a major issue here  - no b-blocker with shock   Length of Stay: [id number], PA-C  10/31/2021, 7:26 AM  Advanced Heart Failure Team Pager [phone] (M-F; 7a - 5p)  Please contact [facility] for night-coverage after hours (5p -7a ) and weekends on [email or web address]  Agree with above.   He remains intubated/sedated. On NE/VP. Minimal response to high-dose IV lasix yesterday. BUN/Cr continue to climb. CVP 12-13. Remains in AF on IV amio.   CXR with persistent diffuse bilateral airspace disease.  Personally reviewed  General:  Sedated on vent  HEENT: normal +  ETT Neck: supple.JVP up Carotids 2+ bilat; no bruits. No lymphadenopathy or thryomegaly appreciated. Cor: PMI nondisplaced. Irregular rate & rhythm. No rubs, gallops or murmurs. Lungs: coarse Abdomen: soft, nontender, + distended. No hepatosplenomegaly. No bruits or masses. Good bowel sounds. Extremities: no cyanosis, clubbing, rash,2+  edema Neuro: intubated/sedated  He remains critically ill with septic shock/ARDS c/b AKI. Co-ox is ok on NE and VP.   D/w Dr. Ruthann Cancer (CCM). Agree with plan for CVVHD. Continue NE/VP. Continue IV amio and heparin.   CRITICAL CARE Performed by: Glori Bickers  Total critical care time: 35 minutes  Critical care time was exclusive of separately billable procedures and treating other patients.  Critical care was necessary to treat or prevent imminent or life-threatening deterioration.  Critical care was time spent personally by me (independent of midlevel providers or residents) on the following activities: development of treatment plan with patient and/or surrogate as well as nursing, discussions with consultants, evaluation of patient's response to treatment, examination of patient, obtaining history from patient or surrogate, ordering and performing treatments and interventions, ordering and review of laboratory studies, ordering and review of radiographic studies, pulse oximetry and re-evaluation of patient's condition.  Glori Bickers, MD  10:52 AM

## 2021-10-31 NOTE — Consult Note (Signed)
[facility] Renal Consultation Note    Indication for Consultation:  Management of AKI on CKD3a; anemia, hypertension/volume and secondary hyperparathyroidism  TOI:[id number], [name], MD  HPI: Joseph [name] is a 65 y.o. male with Past medical history significant for CKD3a, CHF, COPD, CAD s/p CABG, HTN, Hyperlipidemia, T2DM. He presents to [facility] on 11/28 for acute respiratory distress. We were consulted.   ED initial VS BP 154/109, HR 116, RR 36, O2 88%, T 98.2. Pt placed on BiPAP which improved O2 sats. Labwork revealed Hgb 17.9, Lactic acid 4.5>2.4, Na 134, K 4.3, BUN 33, Crt 2.01. Respiratory panel positive for Influenza A. ABG revealed respiratory acidosis pH 7.097, CO2 65.7. CXR revealed volume overload with small pleural effusions. CT Chest revealed cardiomegaly, fluid overload, multifocal lung opacities. Pt was given IV Ceftriaxone, Azithromycin, duonebs and Solu-Medrol. Respiratory status showed minimal improvement. Pt intubated and transferred to [facility] on 12/2 for further workup.   Pt seen at [place] location with Dr. Theador Hawthorne on 10/07/21 for regular checkup. Crt was stable at 1.7.   Pt seen this morning in the bed. Unable to obtain ROS or hx due to pt status.   Past Medical History:  Diagnosis Date   CHF (congestive heart failure) ([place])    a. EF 25-30% in 2016 b. 35-40% by echo in 10/2017 c. 35-40% in 08/2020   COPD (chronic obstructive pulmonary disease) ([place])    Coronary artery disease    a. s/p CABG in 2000 b. patent grafts in 2011 c. 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 Jude   Small bowel  obstruction ([place]) 2007   resolved without surgery   Past Surgical History:  Procedure Laterality Date   basilic vein left arm resection     CARDIAC DEFIBRILLATOR PLACEMENT     CORONARY ARTERY BYPASS GRAFT     6 vessels, 2000   INSERT / REPLACE / REMOVE PACEMAKER  01/05/10   ICD insertion/St. Jude single chamber   RIGHT/LEFT HEART CATH AND CORONARY/GRAFT ANGIOGRAPHY N/A 09/23/2020   Procedure: RIGHT/LEFT HEART CATH AND CORONARY/GRAFT ANGIOGRAPHY;  Surgeon: Sherren Mocha, MD;  Location: [facility];  Service: Cardiovascular;  Laterality: N/A;   Family History  Problem Relation Age of Onset   Heart attack Mother    Emphysema Father    Coronary artery disease Father        s/p cabg   Colon cancer Neg Hx    Social History:  reports that he quit smoking about 33 years ago. His smoking use included cigarettes. He started smoking about 53 years ago. He has never used smokeless tobacco. He reports that he does not drink alcohol and does not use drugs. Allergies  Allergen Reactions   Entresto [Sacubitril-Valsartan]     Worsening renal function and weight gain   Prior to Admission medications   Medication Sig Start Date End Date Taking? Authorizing Provider  acetaminophen (TYLENOL) 500 MG tablet Take 500 mg by mouth every 6 (six) hours as needed for pain.   Yes [provider]  albuterol (VENTOLIN HFA) 108 (90 Base) MCG/ACT inhaler Inhale 2 puffs into  the lungs every 4 (four) hours as needed for wheezing or shortness of breath. 70/62/37  Yes Delora Fuel, MD  aspirin EC 81 MG tablet Take 81 mg by mouth every evening. Swallow whole.   Yes [provider]  atorvastatin (LIPITOR) 80 MG tablet Take 1 tablet (80 mg total) by mouth every evening. 11/29/20  Yes [name], [name], MD  enalapril (VASOTEC) 20 MG tablet Take 1 tablet (20 mg total) by mouth 2 (two) times daily. 04/20/21  Yes Strader, [place] M, PA-C  fish oil-omega-3 fatty acids 1000 MG capsule Take 1 g by mouth  3 (three) times daily.   Yes [provider]  hydrOXYzine (VISTARIL) 25 MG capsule Take 1 capsule (25 mg total) by mouth 3 (three) times daily as needed. 09/13/20  Yes [name], [name], MD  ipratropium (ATROVENT) 0.02 % nebulizer solution Take 500 mcg by nebulization every 4 (four) hours as needed for shortness of breath. 08/01/21  Yes [provider]  JARDIANCE 10 MG TABS tablet TAKE 1 TABLET BY MOUTH DAILY BEFORE BREAKFAST. 09/05/21  Yes Bensimhon, Shaune Pascal, MD  metoprolol succinate (TOPROL-XL) 100 MG 24 hr tablet Take 1 tablet (100 mg total) by mouth daily. Take with or immediately following a meal. 09/14/20 07/29/22 Yes Strader, [place] M, PA-C  Multiple Vitamin (MULTIVITAMIN WITH MINERALS) TABS tablet Take 1 tablet by mouth daily.   Yes [provider]  nitroGLYCERIN (NITROSTAT) 0.4 MG SL tablet Place 1 tablet (0.4 mg total) under the tongue every 5 (five) minutes as needed for chest pain. 05/24/16  Yes Minus Breeding, MD  omeprazole (PRILOSEC) 20 MG capsule Take 1 capsule (20 mg total) by mouth daily. 09/02/21  Yes [name], [name], MD  spironolactone (ALDACTONE) 25 MG tablet Take 0.5 tablets (12.5 mg total) by mouth daily. 02/04/21 07/29/22 Yes Clegg, Amy D, NP  torsemide (DEMADEX) 20 MG tablet Take 1 tablet (20 mg total) by mouth 2 (two) times a week. May take an extra for weight gain or edema 10/03/21 01/01/22 Yes Bensimhon, Shaune Pascal, MD  albuterol (PROVENTIL) (2.5 MG/3ML) 0.083% nebulizer solution Take 2.5 mg by nebulization every 4 (four) hours as needed for shortness of breath. 08/11/21   [provider]  doxycycline (VIBRAMYCIN) 100 MG capsule Take 100 mg by mouth 2 (two) times daily. Patient not taking: Reported on 09/29/2021 08/24/21   [provider]  EPINEPHrine 0.3 mg/0.3 mL IJ SOAJ injection Inject into the muscle as directed. Patient not taking: Reported on 10/06/2021 08/09/21   [provider]  predniSONE (STERAPRED UNI-PAK 21 TAB) 10  MG (21) TBPK tablet Use as directed Patient not taking: Reported on 10/26/2021 10/23/2021   Sharion Balloon, FNP   Current Facility-Administered Medications  Medication Dose Route Frequency Provider Last Rate Last Admin   0.9 %  sodium chloride infusion  250 mL Intravenous Continuous Gerald Leitz D, NP 10 mL/hr at 10/31/21 0800 Infusion Verify at 10/31/21 0800   acetaminophen (TYLENOL) tablet 650 mg  650 mg Per Tube Q6H PRN Maryjane Hurter, MD       Or   acetaminophen (TYLENOL) suppository 650 mg  650 mg Rectal Q6H PRN Maryjane Hurter, MD       albuterol (PROVENTIL) (2.5 MG/3ML) 0.083% nebulizer solution 2.5 mg  2.5 mg Nebulization Q4H PRN Tat, David, MD   2.5 mg at 10/29/21 1130   amiodarone (NEXTERONE PREMIX) 360-4.14 MG/200ML-% (1.8 mg/mL) IV infusion  30 mg/hr Intravenous Continuous Maryjane Hurter, MD 16.67 mL/hr at 10/31/21  0800 30 mg/hr at 10/31/21 0800   arformoterol (BROVANA) nebulizer solution 15 mcg  15 mcg Nebulization BID Margaretha Seeds, MD   15 mcg at 10/31/21 4585   aspirin chewable tablet 81 mg  81 mg Per Tube Daily Maryjane Hurter, MD   81 mg at 10/30/21 0800   atorvastatin (LIPITOR) tablet 80 mg  80 mg Per Tube QPM Maryjane Hurter, MD   80 mg at 10/30/21 1700   budesonide (PULMICORT) nebulizer solution 0.5 mg  0.5 mg Nebulization BID Tat, Shanon Brow, MD   0.5 mg at 10/31/21 0726   ceFEPIme (MAXIPIME) 2 g in sodium chloride 0.9 % 100 mL IVPB  2 g Intravenous Q24H Dang, Thuy D, [facility]   Stopped at 10/30/21 1708   chlorhexidine gluconate (MEDLINE KIT) (PERIDEX) 0.12 % solution 15 mL  15 mL Mouth Rinse BID Orson Eva, MD   15 mL at 10/31/21 0755   Chlorhexidine Gluconate Cloth 2 % PADS 6 each  6 each Topical Daily Tat, David, MD   6 each at 10/30/21 1658   docusate (COLACE) 50 MG/5ML liquid 100 mg  100 mg Per Tube BID Gerald Leitz D, NP   100 mg at 10/30/21 2127   feeding supplement (PROSource TF) liquid 45 mL  45 mL Per Tube TID Maryjane Hurter, MD   45 mL at  10/30/21 2127   feeding supplement (VITAL AF 1.2 CAL) liquid 1,000 mL  1,000 mL Per Tube Continuous Maryjane Hurter, MD 50 mL/hr at 10/30/21 1541 1,000 mL at 10/30/21 1541   fentaNYL (SUBLIMAZE) 5000 mcg / 100 mL (50 mcg/mL) infusion  25-200 mcg/hr Intravenous Continuous Maryjane Hurter, MD 2 mL/hr at 10/31/21 0800 100 mcg/hr at 10/31/21 0800   fentaNYL (SUBLIMAZE) bolus via infusion 25-100 mcg  25-100 mcg Intravenous Q15 min PRN Gerald Leitz D, NP   100 mcg at 10/31/21 0801   heparin ADULT infusion 100 units/mL (25000 units/243m)  1,000 Units/hr Intravenous Continuous MMaryjane Hurter MD 10 mL/hr at 10/31/21 0400 1,000 Units/hr at 10/31/21 0400   hydrocortisone sodium succinate (SOLU-CORTEF) 100 MG injection 100 mg  100 mg Intravenous Q8H MMaryjane Hurter MD   100 mg at 10/31/21 0358   insulin aspart (novoLOG) injection 0-15 Units  0-15 Units Subcutaneous Q4H HGerald LeitzD, NP   5 Units at 10/31/21 0743   insulin glargine-yfgn (SEMGLEE) injection 15 Units  15 Units Subcutaneous BID MAudria Nine DO       ipratropium (ATROVENT) nebulizer solution 0.5 mg  0.5 mg Nebulization TID EMargaretha Seeds MD   0.5 mg at 10/30/21 1943   MEDLINE mouth rinse  15 mL Mouth Rinse 10 times per day Tat,Shanon Brow MD   15 mL at 10/31/21 0503   norepinephrine (LEVOPHED) 16 mg in 2555mpremix infusion  0-40 mcg/min Intravenous Titrated MeMaryjane HurterMD 11.25 mL/hr at 10/31/21 0800 12 mcg/min at 10/31/21 0800   pantoprazole sodium (PROTONIX) 40 mg/20 mL oral suspension 40 mg  40 mg Per Tube Daily MeMaryjane HurterMD   40 mg at 10/30/21 0800   polyethylene glycol (MIRALAX / GLYCOLAX) packet 17 g  17 g Per Tube Daily HaGerald Leitz, NP   17 g at 10/30/21 0800   polyethylene glycol (MIRALAX / GLYCOLAX) packet 17 g  17 g Per Tube Daily PRN MeMaryjane HurterMD       promethazine (PHENERGAN) 6.25 mg in sodium chloride 0.9 % 50 mL IVPB  6.25 mg Intravenous  Q8H PRN Orson Eva, MD        propofol (DIPRIVAN) 1000 MG/100ML infusion  0-50 mcg/kg/min Intravenous Continuous Maryjane Hurter, MD 15.12 mL/hr at 10/31/21 0800 30 mcg/kg/min at 10/31/21 0800   sodium bicarbonate tablet 650 mg  650 mg Per Tube TID Maryjane Hurter, MD   650 mg at 10/30/21 2127   sodium chloride flush (NS) 0.9 % injection 10-40 mL  10-40 mL Intracatheter Q12H Maryjane Hurter, MD   10 mL at 10/30/21 2128   sodium chloride flush (NS) 0.9 % injection 10-40 mL  10-40 mL Intracatheter PRN Maryjane Hurter, MD       vasopressin (PITRESSIN) 20 Units in sodium chloride 0.9 % 100 mL infusion-*FOR SHOCK*  0-0.04 Units/min Intravenous Continuous Maryjane Hurter, MD 12 mL/hr at 10/31/21 0800 0.04 Units/min at 10/31/21 0800   Labs: Basic Metabolic Panel: Recent Labs  Lab 10/29/21 0514 10/29/21 1404 10/29/21 1710 10/29/21 1828 10/30/21 0203 10/30/21 0415 10/31/21 0455  NA 133*   < >  --    < > 130* 130* 131*  K 5.0   < >  --    < > 4.6 4.6 4.7  CL 100  --   --   --   --  97* 100  CO2 19*  --   --   --   --  17* 18*  GLUCOSE 162*  --   --   --   --  257* 254*  BUN 74*  --   --   --   --  95* 122*  CREATININE 3.31*  --   --   --   --  4.66* 5.86*  CALCIUM 8.0*  --   --   --   --  8.0* 7.6*  PHOS 6.6*  --  7.4*  --   --  6.9*  --    < > = values in this interval not displayed.   Liver Function Tests: Recent Labs  Lab 10/21/2021 1828 10/25/21 1945  AST 40 56*  ALT 36 46*  ALKPHOS 121 103  BILITOT 0.7 0.4  PROT 8.3* 6.7  ALBUMIN 4.1 3.0*   CBC: Recent Labs  Lab 10/01/2021 1828 10/25/21 0121 10/27/21 0432 10/28/21 0416 10/28/21 1709 10/29/21 0514 10/29/21 1404 10/30/21 0203 10/30/21 0415 10/31/21 0455  WBC 11.8*   < > 17.1* 28.4*  --  13.7*  --   --  19.9* 17.8*  NEUTROABS 5.8  --   --   --   --   --   --   --   --   --   HGB 17.9*   < > 14.1 15.7   < > 13.1   < > 12.6* 12.1* 9.9*  HCT 58.7*   < > 42.6 48.1   < > 39.2   < > 37.0* 36.3* 29.6*  MCV 95.6   < > 88.6 91.3  --  86.5  --    --  85.8 86.0  PLT 186   < > 203 297  --  137*  --   --  254 165   < > = values in this interval not displayed.   CBG: Recent Labs  Lab 10/30/21 1520 10/30/21 1931 10/30/21 2309 10/31/21 0355 10/31/21 0708  GLUCAP 214* 233* 222* 242* 221*   Studies/Results: US RENAL  Result Date: 10/29/2021 CLINICAL DATA:  AKI EXAM: RENAL / URINARY TRACT ULTRASOUND COMPLETE COMPARISON:  None. FINDINGS: Right Kidney: Not well visualized with cortical thinning  and possibly increased echogenicity. Renal measurements: 9.1 x 4.4 x 4.4 cm = volume: 93.5 mL. No mass or hydronephrosis visualized. Left Kidney: Renal measurements: 10.9 x 5.1 x 4.9 cm = volume: 141.2 mL. Echogenicity within normal limits. No mass or hydronephrosis visualized. Bladder: Partially decompressed by Foley catheter. Other: None. IMPRESSION: No hydronephrosis. The right kidney is not as well visualized with cortical thinning and possibly increased echogenicity suggesting medical renal disease. KA Electronically Signed   By: Macy Mis M.D.   On: 10/29/2021 17:55   DG CHEST PORT 1 VIEW  Result Date: 10/29/2021 CLINICAL DATA:  Acute respiratory failure.  Central line placement. EXAM: PORTABLE CHEST 1 VIEW COMPARISON:  Chest x-ray from earlier same day. FINDINGS: Interval placement of a LEFT IJ central line with tip positioned at the level of the upper SVC. Endotracheal tube remains adequately positioned with tip at the level the clavicles. Enteric tube passes below the diaphragm. Diffuse bilateral airspace opacities are not significantly changed in the short-term interval, perhaps slightly increased in density compared to the earlier chest x-ray of 10/28/2021. No pneumothorax is seen. Heart size and mediastinal contours are grossly stable. LEFT chest wall pacemaker/ICD apparatus in place. IMPRESSION: 1. Interval placement of a LEFT IJ central line with tip positioned at the level of the upper SVC. No pneumothorax seen. 2. Diffuse bilateral  airspace opacities, not significantly changed, compatible with multifocal pneumonia versus pulmonary edema/ARDS. Electronically Signed   By: Franki Cabot M.D.   On: 10/29/2021 15:49   DG Chest Port 1V same Day  Result Date: 10/30/2021 CLINICAL DATA:  Respiratory failure EXAM: PORTABLE CHEST 1 VIEW COMPARISON:  10/29/2021 FINDINGS: Endotracheal tube is seen 5.3 cm above the carina. Nasogastric tube extends into the upper abdomen beyond the margin of the examination. Left internal jugular central venous catheter tip noted within the superior vena caval confluence. Pulmonary insufflation is stable of the lungs are symmetrically well expanded. Superimposed extensive patchy airspace and interstitial infiltrate is stable. Small left pleural effusion is unchanged. No pneumothorax. Coronary artery bypass grafting has been performed. Cardiac size within normal limits. 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 No acute bone abnormality. IMPRESSION: Stable support lines and tubes. Stable pulmonary insufflation. Stable extensive multifocal pulmonary infiltrate, edema versus infection. Stable small left pleural effusion. Electronically Signed   By: Fidela Salisbury M.D.   On: 10/30/2021 20:40    ROS: Unable to obtain due to pt status.    Physical Exam: Vitals:   10/31/21 0745 10/31/21 0800 10/31/21 0815 10/31/21 0830  BP:  135/73    Pulse: (!) 136 (!) 109 (!) 102 (!) 117  Resp: '16 14 14 15  ' Temp:      TempSrc:      SpO2: 90% 92% 93% 94%  Weight:      Height:         General: appears chronically ill, obtunded, intubation in place Head: NCAT sclera not icteric MMM Neck: Not well visualized Lungs: CTA bilaterally. No wheeze, rales or rhonchi. Breathing is unlabored. Heart: RRR. No murmur, rubs or gallops.  Abdomen: soft, nontender, hypoactive BS, mild distension Lower extremities: BL hip and pretibial edema   Assessment/Plan: AKI on CKD 3a - (11/28) BUN 33 > (12/5) BUN 122, (11/28) Crt 2.01  >  (12/5) Crt 5.86. Na 131 on 12/5, Potassium stable currently. Initiate CRRT. Obtain Urinalysis.  Septic/Distributive Shock-  VS stable, BP soft currently. Continue Cefepime and steroids.  Acute respiratory failure with ARDS, Influenza A, Acute COPD Exacerbation - Continue  intubation. Tamiflu completed.  Continue abx as recommended. Continue albuterol neb, arformoterol neb, budesonide neb, ipratropium neb. Follow respiratory recommendations.  Hypertension/volume  - BP soft currently. Volume overload on PE. Initiate Lasix 41m.  Anemia of CKD - Hgb 9.9 on 12/5. Transfuse if Hgb < 7.  Secondary Hyperparathyroidism -  Ca 7.6 on 12/5, Phos 6.9 on 12/4. Initiate calcium gluconate bolus.  Nutrition - Albumin 3.0 on 11/29. Recheck Alubmin in AM. Continue feeding supplements.  CHF- Follow cardiology recommendations PAF with RVR- Continue amiodarone and heparin. Follow cardio recommendations.  Hyperlipidemia- Continue lipitor T2DM- Continue SSI  Zacharia Chipps, PA-S [facility] 10/31/2021, 9:16 AM      Agree with assessment and plan as outline by Zacharia Chipps PA-S. AKI likely related to shock/ATN. Given volume overload, ARDS, poor response to IV diuretics, would recommend initiating CRRT. Appreciate PCCM's assistance with temp line placement. Will start with aiming for net neg 100cc/hr. Discussed with daughter TCoralyn Markover the phone and discussed with primary service.  VGean Quint MD CJoyce [facility]

## 2021-11-01 ENCOUNTER — Inpatient Hospital Stay (HOSPITAL_COMMUNITY): Payer: Medicare HMO

## 2021-11-01 DIAGNOSIS — J9601 Acute respiratory failure with hypoxia: Secondary | ICD-10-CM | POA: Diagnosis not present

## 2021-11-01 DIAGNOSIS — J9602 Acute respiratory failure with hypercapnia: Secondary | ICD-10-CM | POA: Diagnosis not present

## 2021-11-01 LAB — CBC WITH DIFFERENTIAL/PLATELET
Abs Immature Granulocytes: 1.16 10*3/uL — ABNORMAL HIGH (ref 0.00–0.07)
Basophils Absolute: 0.1 10*3/uL (ref 0.0–0.1)
Basophils Relative: 0 %
Eosinophils Absolute: 0 10*3/uL (ref 0.0–0.5)
Eosinophils Relative: 0 %
HCT: 30.3 % — ABNORMAL LOW (ref 39.0–52.0)
Hemoglobin: 10.3 g/dL — ABNORMAL LOW (ref 13.0–17.0)
Immature Granulocytes: 5 %
Lymphocytes Relative: 1 %
Lymphs Abs: 0.3 10*3/uL — ABNORMAL LOW (ref 0.7–4.0)
MCH: 28.5 pg (ref 26.0–34.0)
MCHC: 34 g/dL (ref 30.0–36.0)
MCV: 83.9 fL (ref 80.0–100.0)
Monocytes Absolute: 2 10*3/uL — ABNORMAL HIGH (ref 0.1–1.0)
Monocytes Relative: 8 %
Neutro Abs: 20.2 10*3/uL — ABNORMAL HIGH (ref 1.7–7.7)
Neutrophils Relative %: 86 %
Platelets: 209 10*3/uL (ref 150–400)
RBC: 3.61 MIL/uL — ABNORMAL LOW (ref 4.22–5.81)
RDW: 13.7 % (ref 11.5–15.5)
WBC: 23.7 10*3/uL — ABNORMAL HIGH (ref 4.0–10.5)
nRBC: 0.1 % (ref 0.0–0.2)

## 2021-11-01 LAB — RENAL FUNCTION PANEL
Albumin: 1.9 g/dL — ABNORMAL LOW (ref 3.5–5.0)
Albumin: 1.9 g/dL — ABNORMAL LOW (ref 3.5–5.0)
Anion gap: 13 (ref 5–15)
Anion gap: 12 (ref 5–15)
BUN: 88 mg/dL — ABNORMAL HIGH (ref 8–23)
BUN: 71 mg/dL — ABNORMAL HIGH (ref 8–23)
CO2: 21 mmol/L — ABNORMAL LOW (ref 22–32)
CO2: 23 mmol/L (ref 22–32)
Calcium: 7.7 mg/dL — ABNORMAL LOW (ref 8.9–10.3)
Calcium: 7.6 mg/dL — ABNORMAL LOW (ref 8.9–10.3)
Chloride: 99 mmol/L (ref 98–111)
Chloride: 99 mmol/L (ref 98–111)
Creatinine, Ser: 3.82 mg/dL — ABNORMAL HIGH (ref 0.61–1.24)
Creatinine, Ser: 2.87 mg/dL — ABNORMAL HIGH (ref 0.61–1.24)
GFR, Estimated: 17 mL/min — ABNORMAL LOW (ref >60)
GFR, Estimated: 23 mL/min — ABNORMAL LOW (ref >60)
Glucose, Bld: 231 mg/dL — ABNORMAL HIGH (ref 70–99)
Glucose, Bld: 230 mg/dL — ABNORMAL HIGH (ref 70–99)
Phosphorus: 5.4 mg/dL — ABNORMAL HIGH (ref 2.5–4.6)
Phosphorus: 4.2 mg/dL (ref 2.5–4.6)
Potassium: 4.1 mmol/L (ref 3.5–5.1)
Potassium: 4.1 mmol/L (ref 3.5–5.1)
Sodium: 133 mmol/L — ABNORMAL LOW (ref 135–145)
Sodium: 134 mmol/L — ABNORMAL LOW (ref 135–145)

## 2021-11-01 LAB — CBC
HCT: 30.7 % — ABNORMAL LOW (ref 39.0–52.0)
Hemoglobin: 10.5 g/dL — ABNORMAL LOW (ref 13.0–17.0)
MCH: 28.8 pg (ref 26.0–34.0)
MCHC: 34.2 g/dL (ref 30.0–36.0)
MCV: 84.1 fL (ref 80.0–100.0)
Platelets: 228 10*3/uL (ref 150–400)
RBC: 3.65 MIL/uL — ABNORMAL LOW (ref 4.22–5.81)
RDW: 13.6 % (ref 11.5–15.5)
WBC: 24.3 10*3/uL — ABNORMAL HIGH (ref 4.0–10.5)
nRBC: 0 % (ref 0.0–0.2)

## 2021-11-01 LAB — HEPARIN LEVEL (UNFRACTIONATED)
Heparin Unfractionated: 0.84 IU/mL — ABNORMAL HIGH (ref 0.30–0.70)
Heparin Unfractionated: 0.42 IU/mL (ref 0.30–0.70)

## 2021-11-01 LAB — GLUCOSE, CAPILLARY
Glucose-Capillary: 247 mg/dL — ABNORMAL HIGH (ref 70–99)
Glucose-Capillary: 210 mg/dL — ABNORMAL HIGH (ref 70–99)
Glucose-Capillary: 313 mg/dL — ABNORMAL HIGH (ref 70–99)
Glucose-Capillary: 233 mg/dL — ABNORMAL HIGH (ref 70–99)
Glucose-Capillary: 238 mg/dL — ABNORMAL HIGH (ref 70–99)
Glucose-Capillary: 239 mg/dL — ABNORMAL HIGH (ref 70–99)

## 2021-11-01 LAB — HEMOGLOBIN A1C
Hgb A1c MFr Bld: 6.7 % — ABNORMAL HIGH (ref 4.8–5.6)
Mean Plasma Glucose: 145.59 mg/dL

## 2021-11-01 LAB — MAGNESIUM: Magnesium: 3 mg/dL — ABNORMAL HIGH (ref 1.7–2.4)

## 2021-11-01 MED ORDER — HYDROCORTISONE SOD SUC (PF) 100 MG IJ SOLR
100.0000 mg | Freq: Two times a day (BID) | INTRAMUSCULAR | Status: DC
Start: 2021-11-01 — End: 2021-11-02
  Administered 2021-11-01 – 2021-11-02 (×2): 100 mg via INTRAVENOUS
  Filled 2021-11-01 (×2): qty 2

## 2021-11-01 MED ORDER — INSULIN ASPART 100 UNIT/ML IJ SOLN
4.0000 [IU] | INTRAMUSCULAR | Status: DC
  Administered 2021-11-01 – 2021-11-05 (×25): 4 [IU] via SUBCUTANEOUS

## 2021-11-01 MED ORDER — INSULIN ASPART 100 UNIT/ML IJ SOLN
0.0000 [IU] | INTRAMUSCULAR | Status: DC
  Administered 2021-11-01: 11 [IU] via SUBCUTANEOUS
  Administered 2021-11-01 – 2021-11-02 (×5): 5 [IU] via SUBCUTANEOUS

## 2021-11-01 NOTE — Progress Notes (Signed)
ANTICOAGULATION CONSULT NOTE  Pharmacy Consult for Heparin  Indication: atrial fibrillation  Allergies  Allergen Reactions   Entresto [Sacubitril-Valsartan]     Worsening renal function and weight gain    Patient Measurements: Height: 5\' 11"  (180.3 cm) Weight: 87.9 kg (193 lb 12.6 oz) IBW/kg (Calculated) : 75.3 Heparin Dosing Weight: 80.3 kg   Vital Signs: Temp: 98.8 F (37.1 C) (12/06 1500) Temp Source: Esophageal (12/06 1500) BP: 143/61 (12/06 1400) Pulse Rate: 109 (12/06 1500)  Labs: Recent Labs    10/31/21 0455 10/31/21 1547 10/31/21 2117 11/01/21 0402 11/01/21 0926 11/01/21 1408  HGB 9.9*  --   --  10.5* 10.3*  --   HCT 29.6*  --   --  30.7* 30.3*  --   PLT 165  --   --  228 209  --   HEPARINUNFRC 0.46  --  0.82* 0.84*  --  0.42  CREATININE 5.86* 5.93*  --  3.82*  --   --      Estimated Creatinine Clearance: 20 mL/min (A) (by C-G formula based on SCr of 3.82 mg/dL (H)).  Assessment: 65 y.o. male with Afib to continue on IV heparin.  Heparin level therapeutic after rate reductions.  RN reports some mild oozing from CRRT line.  Goal of Therapy:  Heparin level 0.3-0.7 units/ml Monitor platelets by anticoagulation protocol: Yes   Plan:  Continue heparin gtt at 700 units/hr Daily heparin level and CBC Monitor for bleeding - RN will notify Pharmacy if bleeding worsens  Zaylen Susman D. 01-03-1985, PharmD, BCPS, BCCCP 11/01/2021, 3:21 PM

## 2021-11-01 NOTE — TOC CM/SW Note (Signed)
HF TOC CM spoke to [facility] rep, [name]. Pt will need to be off CRRT before coming to LTAC. Pt can come on vent. [facility] will be following for pt readiness for LTAC. Will discuss with family once pt is ready. Will need insurance auth prior to transfer to LTAC. HF TOC CM will continue to follow for dc needs. Isidoro Donning RN3 CCM, Heart Failure TOC CM [phone]

## 2021-11-01 NOTE — Progress Notes (Signed)
[facility] Progress Note    Assessment/ Plan:   AKI on CKD 3a - AKI likely secondary to ATN/shock. Follows with [facility] as an outpatient. Marginal response to lasix challenge. Initiated CRRT 12/5, appreciate CCM's assistance with temp line placement. Will continue CRRT for now, will try to increase UF goals to net neg 200cc/hr to see if he can tolerate this Septic/Distributive Shock-  on pressor support, receiving cefepime Acute respiratory failure with ARDS, Influenza A, Acute COPD Exacerbation - vent support per primary service, UF'ing as tolerated Anemia of CKD - Hgb 10.5. Transfuse if Hgb < 7.  Secondary Hyperparathyroidism -  corrected cal wnl. Monitor phos, improving on crrt CHF- cardiology on board, UF as tolerated PAF with RVR- Continue amiodarone and heparin. Cardio on board T2DM- per primary Nutrition - c/w tf's, push protein  Subjective:   No acute events. Tolerating crrt. Net neg ~500cc. Remains on norepi and vaso. FIO2 now 55   Objective:   BP (!) 125/58   Pulse (!) 107   Temp 97.7 F (36.5 C)   Resp 15   Ht _0  (1.803 m)   Wt 87.9 kg   SpO2 92%   BMI 27.03 kg/m   Intake/Output Summary (Last 24 hours) at 11/01/2021 6314 Last data filed at 11/01/2021 0700 Gross per 24 hour  Intake 3388.06 ml  Output 4058 ml  Net -669.94 ml   Weight change: -2.5 kg  Physical Exam: Gen:ill appearing, intubated/sedated HFW:YOVZC irreg Resp:diminished air entry bibasilar, intubated HYI:FOYD, nt/nd XAJ:OINOM edema bl LE's Neuro: sedated Dialysis access: rij temp line  Imaging: DG CHEST PORT 1 VIEW  Result Date: 10/31/2021 CLINICAL DATA:  Shortness of breath EXAM: PORTABLE CHEST 1 VIEW COMPARISON:  Previous studies including the examination of 10/30/2021 FINDINGS: Transverse diameter of heart is increased. Central pulmonary vessels are more prominent. Increased interstitial and alveolar densities are seen in the both parahilar regions and lower lung fields,  more so on the right side. There is blunting of right lateral costophrenic angle suggesting pleural effusion with interval increase in size. There is no pneumothorax. Tip of endotracheal tube is approximately 7.7 cm above the carina. Tip of dialysis catheter placed through the right jugular vein is seen in the region of right atrium. Enteric tube is noted traversing the esophagus. Tip of left jugular central venous catheter is seen in the region of superior vena cava. IMPRESSION: Cardiomegaly. Increased interstitial and alveolar markings are seen in both lungs, more so on the right side suggesting worsening pulmonary edema. Possibility of underlying pneumonia is not excluded. There is interval increase in right pleural effusion. Electronically Signed   By: Elmer Picker M.D.   On: 10/31/2021 13:12   DG Chest Port 1V same Day  Result Date: 10/30/2021 CLINICAL DATA:  Respiratory failure EXAM: PORTABLE CHEST 1 VIEW COMPARISON:  10/29/2021 FINDINGS: Endotracheal tube is seen 5.3 cm above the carina. Nasogastric tube extends into the upper abdomen beyond the margin of the examination. Left internal jugular central venous catheter tip noted within the superior vena caval confluence. Pulmonary insufflation is stable of the lungs are symmetrically well expanded. Superimposed extensive patchy airspace and interstitial infiltrate is stable. Small left pleural effusion is unchanged. No pneumothorax. Coronary artery bypass grafting has been performed. Cardiac size within normal limits. 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 No acute bone abnormality. IMPRESSION: Stable support lines and tubes. Stable pulmonary insufflation. Stable extensive multifocal pulmonary infiltrate, edema versus infection. Stable small left pleural effusion. Electronically Signed   By:  Fidela Salisbury M.D.   On: 10/30/2021 20:40    Labs: BMET Recent Labs  Lab 10/27/21 0432 10/28/21 0416 10/28/21 1636 10/28/21 1709  10/29/21 0514 10/29/21 1404 10/29/21 1710 10/29/21 1828 10/30/21 0203 10/30/21 0415 10/31/21 0455 10/31/21 1547 11/01/21 0402  NA 135 134*  --    < > 133* 131*  --  129* 130* 130* 131* 132* 133*  K 4.0 4.6  --    < > 5.0 4.5  --  4.6 4.6 4.6 4.7 4.6 4.1  CL 105 105  --   --  100  --   --   --   --  97* 100 99 99  CO2 18* 15*  --   --  19*  --   --   --   --  17* 18* 18* 21*  GLUCOSE 136* 193*  --   --  162*  --   --   --   --  257* 254* 243* 231*  BUN 51* 55*  --   --  74*  --   --   --   --  95* 122* 126* 88*  CREATININE 1.98* 2.60*  --   --  3.31*  --   --   --   --  4.66* 5.86* 5.93* 3.82*  CALCIUM 8.3* 8.2*  --   --  8.0*  --   --   --   --  8.0* 7.6* 7.5* 7.7*  PHOS  --   --  5.9*  --  6.6*  --  7.4*  --   --  6.9*  --  7.5* 5.4*   < > = values in this interval not displayed.   CBC Recent Labs  Lab 10/29/21 0514 10/29/21 1404 10/30/21 0203 10/30/21 0415 10/31/21 0455 11/01/21 0402  WBC 13.7*  --   --  19.9* 17.8* 24.3*  HGB 13.1   < > 12.6* 12.1* 9.9* 10.5*  HCT 39.2   < > 37.0* 36.3* 29.6* 30.7*  MCV 86.5  --   --  85.8 86.0 84.1  PLT 137*  --   --  254 165 228   < > = values in this interval not displayed.    Medications:     arformoterol  15 mcg Nebulization BID   vitamin C  250 mg Per Tube BID   aspirin  81 mg Per Tube Daily   atorvastatin  80 mg Per Tube QPM   B-complex with vitamin C  1 tablet Per Tube Daily   budesonide (PULMICORT) nebulizer solution  0.5 mg Nebulization BID   chlorhexidine gluconate (MEDLINE KIT)  15 mL Mouth Rinse BID   Chlorhexidine Gluconate Cloth  6 each Topical Daily   feeding supplement (PROSource TF)  90 mL Per Tube QID   hydrocortisone sod succinate (SOLU-CORTEF) inj  100 mg Intravenous Q8H   insulin aspart  0-15 Units Subcutaneous Q4H   insulin glargine-yfgn  15 Units Subcutaneous BID   ipratropium  0.5 mg Nebulization TID   mouth rinse  15 mL Mouth Rinse 10 times per day   pantoprazole sodium  40 mg Per Tube Daily    polyethylene glycol  17 g Per Tube Daily   senna-docusate  1 tablet Per Tube BID   sodium bicarbonate  650 mg Per Tube TID   sodium chloride flush  10-40 mL Intracatheter Q12H      Gean Quint, MD [facility] 11/01/2021, 7:52 AM

## 2021-11-01 NOTE — Progress Notes (Addendum)
Advanced Heart Failure Rounding Note  PCP-Cardiologist: Minus Breeding, MD   Subjective:    Remains intubated and sedated. FiO2 55%. PEEP 5.  WBC higher today, 17>>24 but AF. On steroids. Remains on abx.   Now on CVVHD w/ 3.3L fluid removal yesterday. Still making urine, 550 cc out.  CVVHD currently pulling 100cc/hr.   Higher pressor requirements w/ CVVHD, NE now at 23 (14 yesterday). Remains on VP 0.03. Co-ox not drawn yet. CVP 16.   Remains in Afib, 120s. Amio gtt at 30/hr    Objective:   Weight Range: 87.9 kg Body mass index is 27.03 kg/m.   Vital Signs:   Temp:  [94.6 F (34.8 C)-99 F (37.2 C)] 98.1 F (36.7 C) (12/06 0700) Pulse Rate:  [67-136] 106 (12/06 0700) Resp:  [11-19] 14 (12/06 0700) BP: (112-144)/(50-94) 128/76 (12/06 0600) SpO2:  [84 %-100 %] 91 % (12/06 0700) Arterial Line BP: (97-171)/(46-78) 119/56 (12/06 0700) FiO2 (%):  [40 %-55 %] 55 % (12/06 0600) Weight:  [87.9 kg] 87.9 kg (12/06 0500) Last BM Date: 11/01/21  Weight change: Filed Weights   10/30/21 0419 10/31/21 0535 11/01/21 0500  Weight: 86.8 kg 90.4 kg 87.9 kg    Intake/Output:   Intake/Output Summary (Last 24 hours) at 11/01/2021 0733 Last data filed at 11/01/2021 0700 Gross per 24 hour  Intake 3388.06 ml  Output 4058 ml  Net -669.94 ml      Physical Exam    CVP 16  General:  critically ill, intubated and sedated  HEENT: normal + ETT Neck: supple. + Rt IJ HD, Lt IJ CVC. Carotids 2+ bilat; no bruits. No lymphadenopathy or thyromegaly appreciated. Cor: PMI nondisplaced. Irregularly irregular rhythm and tachy rate. No rubs, gallops or murmurs. Lungs: intubated and clear  Abdomen: soft, nontender, nondistended. No hepatosplenomegaly. No bruits or masses. Good bowel sounds. Extremities: no cyanosis, clubbing, rash, edema. Warm  Neuro: intubated and sedated GU: + foley    Telemetry   Afib w/ RVR 110s-120s, personally reviewed   EKG    No new EKG to review    Labs     CBC Recent Labs    10/31/21 0455 11/01/21 0402  WBC 17.8* 24.3*  HGB 9.9* 10.5*  HCT 29.6* 30.7*  MCV 86.0 84.1  PLT 165 681   Basic Metabolic Panel Recent Labs    10/30/21 0415 10/31/21 0455 10/31/21 1547 11/01/21 0402  NA 130*   < > 132* 133*  K 4.6   < > 4.6 4.1  CL 97*   < > 99 99  CO2 17*   < > 18* 21*  GLUCOSE 257*   < > 243* 231*  BUN 95*   < > 126* 88*  CREATININE 4.66*   < > 5.93* 3.82*  CALCIUM 8.0*   < > 7.5* 7.7*  MG 2.7*  --   --  3.0*  PHOS 6.9*  --  7.5* 5.4*   < > = values in this interval not displayed.   Liver Function Tests Recent Labs    10/31/21 1547 11/01/21 0402  ALBUMIN 1.8* 1.9*   No results for input(s): LIPASE, AMYLASE in the last 72 hours. Cardiac Enzymes No results for input(s): CKTOTAL, CKMB, CKMBINDEX, TROPONINI in the last 72 hours.  BNP: BNP (last 3 results) Recent Labs    10/23/2021 1909 10/25/21 1945 10/28/21 1746  BNP 243.0* 383.0* 377.2*    ProBNP (last 3 results) No results for input(s): PROBNP in the last 8760 hours.  D-Dimer No results for input(s): DDIMER in the last 72 hours. Hemoglobin A1C No results for input(s): HGBA1C in the last 72 hours. Fasting Lipid Panel Recent Labs    10/30/21 0415  TRIG 136   Thyroid Function Tests No results for input(s): TSH, T4TOTAL, T3FREE, THYROIDAB in the last 72 hours.  Invalid input(s): FREET3  Other results:   Imaging    DG CHEST PORT 1 VIEW  Result Date: 10/31/2021 CLINICAL DATA:  Shortness of breath EXAM: PORTABLE CHEST 1 VIEW COMPARISON:  Previous studies including the examination of 10/30/2021 FINDINGS: Transverse diameter of heart is increased. Central pulmonary vessels are more prominent. Increased interstitial and alveolar densities are seen in the both parahilar regions and lower lung fields, more so on the right side. There is blunting of right lateral costophrenic angle suggesting pleural effusion with interval increase in size. There is no  pneumothorax. Tip of endotracheal tube is approximately 7.7 cm above the carina. Tip of dialysis catheter placed through the right jugular vein is seen in the region of right atrium. Enteric tube is noted traversing the esophagus. Tip of left jugular central venous catheter is seen in the region of superior vena cava. IMPRESSION: Cardiomegaly. Increased interstitial and alveolar markings are seen in both lungs, more so on the right side suggesting worsening pulmonary edema. Possibility of underlying pneumonia is not excluded. There is interval increase in right pleural effusion. Electronically Signed   By: Elmer Picker M.D.   On: 10/31/2021 13:12     Medications:     Scheduled Medications:  arformoterol  15 mcg Nebulization BID   vitamin C  250 mg Per Tube BID   aspirin  81 mg Per Tube Daily   atorvastatin  80 mg Per Tube QPM   B-complex with vitamin C  1 tablet Per Tube Daily   budesonide (PULMICORT) nebulizer solution  0.5 mg Nebulization BID   chlorhexidine gluconate (MEDLINE KIT)  15 mL Mouth Rinse BID   Chlorhexidine Gluconate Cloth  6 each Topical Daily   feeding supplement (PROSource TF)  90 mL Per Tube QID   hydrocortisone sod succinate (SOLU-CORTEF) inj  100 mg Intravenous Q8H   insulin aspart  0-15 Units Subcutaneous Q4H   insulin glargine-yfgn  15 Units Subcutaneous BID   ipratropium  0.5 mg Nebulization TID   mouth rinse  15 mL Mouth Rinse 10 times per day   pantoprazole sodium  40 mg Per Tube Daily   polyethylene glycol  17 g Per Tube Daily   senna-docusate  1 tablet Per Tube BID   sodium bicarbonate  650 mg Per Tube TID   sodium chloride flush  10-40 mL Intracatheter Q12H    Infusions:   prismasol BGK 4/2.5 500 mL/hr at 11/01/21 0104   sodium chloride 10 mL/hr at 11/01/21 0700   amiodarone 30 mg/hr (11/01/21 0700)   ceFEPime (MAXIPIME) IV Stopped (11/01/21 0531)   feeding supplement (VITAL 1.5 CAL) 1,000 mL (10/31/21 1449)   fentaNYL infusion INTRAVENOUS 100  mcg/hr (11/01/21 0700)   heparin 700 Units/hr (11/01/21 0700)   norepinephrine (LEVOPHED) Adult infusion 23 mcg/min (11/01/21 0700)   prismasol BGK 2/2.5 dialysis solution 1,500 mL/hr at 11/01/21 0428   prismasol BGK 2/2.5 replacement solution 300 mL/hr at 10/31/21 1442   promethazine (PHENERGAN) injection (IM or IVPB)     propofol (DIPRIVAN) infusion 30 mcg/kg/min (11/01/21 0700)   vasopressin 0.03 Units/min (11/01/21 0700)    PRN Medications: acetaminophen **OR** acetaminophen, albuterol, fentaNYL, heparin, polyethylene glycol, promethazine (PHENERGAN) injection (  IM or IVPB), sodium chloride flush    Patient Profile   65 y/o male w/ CAD, chronic systolic heart failure and CKD IIIb, admitted w/ Influenza A, sepsis PNA, acute hypoxic respiratory failure and AKI on CKD.   Assessment/Plan   1. Septic/distributive shock - Initial Co-ox 84% - can wean NE as tolerated. Continue VP - Continue cefepime and stress-dose steroids - he is volume replete/overloaded   2. Acute hypoxic respiratory failure - has PNA/ARDS with diffuse lung injury on CXR - continue vent support - now w/ pulmonary edema post volume resuscitation>>CVVHD for volume removal    3. Acute on chronic systolic HF due to iCM - EF stable 35-40% on echo - Volume overloaded in setting of fluid resuscitation for sepsis - CVP 16 - poor response to high dose IV diuretics in setting of AKI 2/2 ATN  - Now on CVVHD for volume removal  - Initial Co-ox 84%. Will follow  - Off GDMT due to shock/AKI   4. PAF with RVR - continue amio gtt and heparin   5. AKI on CKD 3b due to ATN/shock - baseline Scr 1.5-2.0. Peaked to 5.86 - still making urine - renal u/s with medico-renal disease in R>L kidney - Keep MAP > 70 - CVVHD initiated 12/5   7. CAD with elevated trop due to demand ischemia - stable.  - continue ASA/statin - doubt ischemia is a major issue here  - no b-blocker with shock   Length of Stay: [address] PA-C  11/01/2021, 7:33 AM  Advanced Heart Failure Team Pager [phone] (M-F; 7a - 5p)  Please contact [facility] for night-coverage after hours (5p -7a ) and weekends on [email or web address]  Agree with above.  Remains on vent. Critically ill. Now on CVVHD. Pressor requirements increasing NE 24 VP 0.03. Remains in AF with RVR on amio gtt.   General:  Sedated on vent ill-appearing.  HEENT: normal + ETT Neck: supple. + HD cath  Carotids 2+ bilat; no bruits. No lymphadenopathy or thryomegaly appreciated. Cor: PMI nondisplaced. Irregular tachy Lungs: coarse Abdomen: soft, nontender, + distended. Hypoactive bowel sounds. Extremities: no cyanosis, clubbing, rash, 2+ edema Neuro: Intubated/sedated  He remains critically ill. Primary issues now remain sepsis/ARDS/AKI. Continue supportive care. Pull volume as tolerated. Continue amio/heparin for AF. Prognosis guarded.   CRITICAL CARE Performed by: Glori Bickers  Total critical care time: 45 minutes  Critical care time was exclusive of separately billable procedures and treating other patients.  Critical care was necessary to treat or prevent imminent or life-threatening deterioration.  Critical care was time spent personally by me (independent of midlevel providers or residents) on the following activities: development of treatment plan with patient and/or surrogate as well as nursing, discussions with consultants, evaluation of patient's response to treatment, examination of patient, obtaining history from patient or surrogate, ordering and performing treatments and interventions, ordering and review of laboratory studies, ordering and review of radiographic studies, pulse oximetry and re-evaluation of patient's condition.  Glori Bickers, MD  1:08 PM

## 2021-11-01 NOTE — Progress Notes (Signed)
ANTICOAGULATION CONSULT NOTE Pharmacy Consult for Heparin  Indication: atrial fibrillation  Allergies  Allergen Reactions   Entresto [Sacubitril-Valsartan]     Worsening renal function and weight gain    Patient Measurements: Height: 5\' 11"  (180.3 cm) Weight: 90.4 kg (199 lb 4.7 oz) IBW/kg (Calculated) : 75.3 Heparin Dosing Weight: 80.3 kg   Vital Signs: Temp: 97.5 F (36.4 C) (12/06 0430) Temp Source: Esophageal (12/06 0400) BP: 139/67 (12/06 0400) Pulse Rate: 124 (12/06 0430)  Labs: Recent Labs    10/30/21 0415 10/30/21 1142 10/31/21 0455 10/31/21 1547 10/31/21 2117 11/01/21 0402  HGB 12.1*  --  9.9*  --   --  10.5*  HCT 36.3*  --  29.6*  --   --  30.7*  PLT 254  --  165  --   --  228  HEPARINUNFRC >1.10*   < > 0.46  --  0.82* 0.84*  CREATININE 4.66*  --  5.86* 5.93*  --  3.82*   < > = values in this interval not displayed.     Estimated Creatinine Clearance: 21.6 mL/min (A) (by C-G formula based on SCr of 3.82 mg/dL (H)).  Assessment: 65 y.o. male with Afib for heparin  Goal of Therapy:  Heparin level 0.3-0.7 units/ml Monitor platelets by anticoagulation protocol: Yes   Plan:  Decrease Heparin 700 units/hr Check heparin level in 8 hours.  79, PharmD, BCPS

## 2021-11-01 NOTE — Consult Note (Addendum)
WOC Nurse Consult Note: Reason for Consult: Consult requested for sacrum.  Pt is critically ill with multiple systemic factors which can impair healing. He was noted to have a Stage 1 pressure injury to this location on admission, but this has declined to a dark purple Deep tissue pressure injury.  Pressure Injury POA: No Measurement: 4X4cm across sacrum/inner gluteal fold.  Red moist macerated skin surrounding. Dressing procedure/placement/frequency: Pt is on a low airloss mattress to reduce pressure. Topical treatment orders provided for bedside nurses to perform as follows to protect location: Foam dressing to sacrum, change Q 3 days or PRN soiling.  Deep tissue presure injuries are high risk to evolve into full thickness tissue loss; WOC team will assess weekly to determine if a change in the plan of care is indicated at that time.  Cammie Mcgee MSN, RN, CWOCN, [place], CNS [phone]

## 2021-11-01 NOTE — Progress Notes (Addendum)
NAME:  Joseph [name], MRN:  [id number], DOB:  May 06, 1956, LOS: 24 ADMISSION DATE:  10/14/2021, CONSULTATION DATE:  10/28/2021 REFERRING MD:  Dr. Carles Collet, CHIEF COMPLAINT:  Acute hypoxic respiratory failure    History of Present Illness:  Joseph [name] is a 65 y.o. male with a PMH significant for CHF, COPD, CAD s/p CABG, HTN, HLD, MRSA bacteremia, and prior GI bleed who presented to the ED for acute respiratory distress.   On ED arrival patient was placed on BIPAP for significant hypoxia. Workup revealed patient was Flu A positive with underlying COPD exacerbation and likely CHF exacerbation with component of pulmonary edema as well. He was treated with IV diuretics, Tamaflu, CAP coverage, Amiodarone drip with beta blocker for persistent A-fib, and steroids with limited improvement in respiratory status leading to the decision to intubated and transfer to [facility] for further pulmonary and cardiac care   Pertinent  Medical History  CHF, COPD, CAD s/p CABG, HTN, HLD, MRSA bacteremia, and prior GI bleed  Significant Hospital Events: Including procedures, antibiotic start and stop dates in addition to other pertinent events   11/28 admitted at [facility] for acute hypoxic respiratory failure  12/2 intubated and [facility]   10/31/2021 CVVHD initiated  Interim History / Subjective:  12/6: amio, norepi and vaso remain on. Overnight  levo needs increased. ( Was cold 52 F, but now has warmed and temp has normalized.) Remains ventilated. Unable to wean 12/6/am . CVVHD pulling 90 cc per hour, CVP is 15, Remains net + 4860 cc's, Remains in fib with rate up to 120 on amio, Blood sugars remain > 200, BUN now >. 88 ( from 126) , Creatinine 3.82 from 5.93, Urine output 550 cc last 24 hours, Phos 5.4 ( 7.5) , Mag is 3,  However uop is up 389ml yesterday with 320mg  lasix.  WBC 24.3 from 17.8, HGB 10.5 ( 9.9) , platelets 228  Objective   Blood pressure (!) 136/111, pulse (!) 128, temperature 98.1 F (36.7  C), resp. rate 15, height 5\' 11"  (1.803 m), weight 87.9 kg, SpO2 93 %. CVP:  [11 mmHg-22 mmHg] 14 mmHg  Vent Mode: PRVC FiO2 (%):  [40 %-55 %] 55 % Set Rate:  [15 bmp] 15 bmp Vt Set:  [600 mL] 600 mL PEEP:  [5 cmH20] 5 cmH20 Plateau Pressure:  [17 cmH20-26 cmH20] 17 cmH20   Intake/Output Summary (Last 24 hours) at 11/01/2021 0830 Last data filed at 11/01/2021 0800 Gross per 24 hour  Intake 3392.81 ml  Output 4283 ml  Net -890.19 ml   Filed Weights   10/30/21 0419 10/31/21 0535 11/01/21 0500  Weight: 86.8 kg 90.4 kg 87.9 kg    Examination: General appearance: 65 y.o., male, intubated, sedated, unresponsive  Eyes: anicteric sclerae; PERRL, conjunctiva injected HENT: NCAT; MMM, No LAD, Vas Cath RIJ, Triple lumen LIJ, OG Lungs: Bilateral chest excursion, coarse rhonchi bilaterally, crackles per bases CV: tachy IRIR, Fib per tele, no murmur , rub or gallop Abdomen: Soft, non-tender; non-distended, BS present , TF infusing  Extremities: No obvious deformities, + edema warm, brisk refill Skin: Normal turgor and texture; no rash, no lesions, Neuro: deeply sedated, grossly nonfocal. Does grimace to pain    Cxr 10/31/2021>> with bilateral infiltrates vs worsening edema.   Resolved Hospital Problem list     Assessment & Plan:  # Acute Hypoxic and Hypercapnic Respiratory Failure with moderate ARDS  # Flu A positive  # Acute COPD exacerbation  Some volume  overload still to be dealt with but I think he does have ARDS in setting of flu, possible superimposed HAP.  P: - Lung protective ventilation - Follow cultures: ngtd - VAP bundle in place  - PAD protocol - cefepime: day 5 - S/p course of tamiflu - Continue Brovana, Pulmicort, atrovent nebs - IV steroids for shock as below - Keep RASS -2   # Shock Distributive based on coox and warm BLE ext on exam. ? Septic and effect of sedation. -coox 84.2  12/4>> Unavailable 12/6 as lab machine is down - wean levo for MAP 65 - vaso  0.03 - stress dose steroids 12/3>> Will start titrating dose down ( Q 12 hours today, then please drop to QD on 12/7)  Leukocytosis WBC up from 17.8>> 24.3 Hypothermic overnight with CVVHD Plan Continue ABX as above Trend WBC and fever curve Bear hugger with goal of normothermia Will Cx Sputum  # Acute renal failure, oliguric -Baseline creatinine 1.4-1.6 with admission creatinine 2.01 P: - remains  volume up. CVVHD started 12/5 -diuresis yesterday given with slight increase in uop but certain increase in indices -BUN too elevated at this time and fear if cont to climb could compromise neuro exam and ability to extubate if not already Avoid nephrotoxins Ensure adequate renal perfusion   Hx of HFrEF with ischemic cardiomyopathy  -EF 35-40% Persistent A-Flutter/A-fib with RVR -CHA2DS2-VASc score 4 Elevated troponin with hx of CAD -s/p CABG 2000 P: - Cardiology following , appreciate assist - Heparin gtt  - amiodarone gtt - hold Beta Blocker, in shock - Strict intake and output  - CVVHD with net negative goal per renal - Any additional Diuresis per cards - Trend CVP's - Daily weight to assess volume status  Type 2 diabetes  -Home medication included Jardiance, Hgb A1C 5.6 P: -SSI, will increase scale today with elevated bs. Perhaps could stand to add on long acting insulin as well CBG goal 140-180 - Will add TF Coverage 4 Units Q 4  Stage 1 coccyx pressure injury P: Pressure alleviating devices  Monitor   Best Practice (right click and "Reselect all SmartList Selections" daily)   Diet/type: tubefeeds DVT prophylaxis: systemic heparin GI prophylaxis: PPI Lines: N/A Foley:  N/A Code Status:  full code Last date of multidisciplinary goals of care discussion: Pending    Critical care time: The patient is critically ill with multiple organ systems failure and requires high complexity decision making for assessment and support, frequent evaluation and titration of  therapies, application of advanced monitoring technologies and extensive interpretation of multiple databases.  Critical care time 40 mins. This represents my time independent of the NPs time taking care of the pt. This is excluding procedures.    Bevelyn Ngo, MSN, AGACNP-BC [facility] See Amion for personal pager PCCM on call pager [phone]  11/01/2021 8:31 AM

## 2021-11-02 ENCOUNTER — Inpatient Hospital Stay (HOSPITAL_COMMUNITY): Payer: Medicare HMO

## 2021-11-02 DIAGNOSIS — E1159 Type 2 diabetes mellitus with other circulatory complications: Secondary | ICD-10-CM

## 2021-11-02 DIAGNOSIS — I4891 Unspecified atrial fibrillation: Secondary | ICD-10-CM | POA: Diagnosis not present

## 2021-11-02 DIAGNOSIS — I255 Ischemic cardiomyopathy: Secondary | ICD-10-CM | POA: Diagnosis not present

## 2021-11-02 DIAGNOSIS — J9601 Acute respiratory failure with hypoxia: Secondary | ICD-10-CM | POA: Diagnosis not present

## 2021-11-02 DIAGNOSIS — I152 Hypertension secondary to endocrine disorders: Secondary | ICD-10-CM

## 2021-11-02 DIAGNOSIS — J9602 Acute respiratory failure with hypercapnia: Secondary | ICD-10-CM | POA: Diagnosis not present

## 2021-11-02 DIAGNOSIS — N179 Acute kidney failure, unspecified: Secondary | ICD-10-CM | POA: Diagnosis not present

## 2021-11-02 DIAGNOSIS — E440 Moderate protein-calorie malnutrition: Secondary | ICD-10-CM | POA: Insufficient documentation

## 2021-11-02 LAB — BRAIN NATRIURETIC PEPTIDE: B Natriuretic Peptide: 129.5 pg/mL — ABNORMAL HIGH (ref 0.0–100.0)

## 2021-11-02 LAB — GLUCOSE, CAPILLARY
Glucose-Capillary: 240 mg/dL — ABNORMAL HIGH (ref 70–99)
Glucose-Capillary: 231 mg/dL — ABNORMAL HIGH (ref 70–99)
Glucose-Capillary: 300 mg/dL — ABNORMAL HIGH (ref 70–99)
Glucose-Capillary: 184 mg/dL — ABNORMAL HIGH (ref 70–99)
Glucose-Capillary: 208 mg/dL — ABNORMAL HIGH (ref 70–99)

## 2021-11-02 LAB — COMPREHENSIVE METABOLIC PANEL
ALT: 25 U/L (ref 0–44)
AST: 22 U/L (ref 15–41)
Albumin: 1.9 g/dL — ABNORMAL LOW (ref 3.5–5.0)
Alkaline Phosphatase: 60 U/L (ref 38–126)
Anion gap: 11 (ref 5–15)
BUN: 62 mg/dL — ABNORMAL HIGH (ref 8–23)
CO2: 23 mmol/L (ref 22–32)
Calcium: 7.7 mg/dL — ABNORMAL LOW (ref 8.9–10.3)
Chloride: 98 mmol/L (ref 98–111)
Creatinine, Ser: 2.3 mg/dL — ABNORMAL HIGH (ref 0.61–1.24)
GFR, Estimated: 30 mL/min — ABNORMAL LOW (ref >60)
Glucose, Bld: 236 mg/dL — ABNORMAL HIGH (ref 70–99)
Potassium: 3.9 mmol/L (ref 3.5–5.1)
Sodium: 132 mmol/L — ABNORMAL LOW (ref 135–145)
Total Bilirubin: 0.9 mg/dL (ref 0.3–1.2)
Total Protein: 5.1 g/dL — ABNORMAL LOW (ref 6.5–8.1)

## 2021-11-02 LAB — RENAL FUNCTION PANEL
Albumin: 2.1 g/dL — ABNORMAL LOW (ref 3.5–5.0)
Anion gap: 9 (ref 5–15)
BUN: 58 mg/dL — ABNORMAL HIGH (ref 8–23)
CO2: 24 mmol/L (ref 22–32)
Calcium: 8.2 mg/dL — ABNORMAL LOW (ref 8.9–10.3)
Chloride: 101 mmol/L (ref 98–111)
Creatinine, Ser: 2.17 mg/dL — ABNORMAL HIGH (ref 0.61–1.24)
GFR, Estimated: 33 mL/min — ABNORMAL LOW (ref >60)
Glucose, Bld: 209 mg/dL — ABNORMAL HIGH (ref 70–99)
Phosphorus: 4.8 mg/dL — ABNORMAL HIGH (ref 2.5–4.6)
Potassium: 4.7 mmol/L (ref 3.5–5.1)
Sodium: 134 mmol/L — ABNORMAL LOW (ref 135–145)

## 2021-11-02 LAB — DIC (DISSEMINATED INTRAVASCULAR COAGULATION)PANEL
D-Dimer, Quant: 1.16 ug/mL-FEU — ABNORMAL HIGH (ref 0.00–0.50)
Fibrinogen: 562 mg/dL — ABNORMAL HIGH (ref 210–475)
INR: 1.1 (ref 0.8–1.2)
Platelets: 160 10*3/uL (ref 150–400)
Prothrombin Time: 13.8 seconds (ref 11.4–15.2)
Smear Review: NONE SEEN
aPTT: 37 seconds — ABNORMAL HIGH (ref 24–36)

## 2021-11-02 LAB — CBC
HCT: 30.1 % — ABNORMAL LOW (ref 39.0–52.0)
Hemoglobin: 10 g/dL — ABNORMAL LOW (ref 13.0–17.0)
MCH: 28.4 pg (ref 26.0–34.0)
MCHC: 33.2 g/dL (ref 30.0–36.0)
MCV: 85.5 fL (ref 80.0–100.0)
Platelets: 183 10*3/uL (ref 150–400)
RBC: 3.52 MIL/uL — ABNORMAL LOW (ref 4.22–5.81)
RDW: 14.1 % (ref 11.5–15.5)
WBC: 23.1 10*3/uL — ABNORMAL HIGH (ref 4.0–10.5)
nRBC: 0.1 % (ref 0.0–0.2)

## 2021-11-02 LAB — TRIGLYCERIDES: Triglycerides: 141 mg/dL (ref <150)

## 2021-11-02 LAB — HEPARIN LEVEL (UNFRACTIONATED): Heparin Unfractionated: 0.43 IU/mL (ref 0.30–0.70)

## 2021-11-02 LAB — COOXEMETRY PANEL
Carboxyhemoglobin: 0.7 % (ref 0.5–1.5)
Methemoglobin: 1.3 % (ref 0.0–1.5)
O2 Saturation: 61.8 %
Total hemoglobin: 10.5 g/dL — ABNORMAL LOW (ref 12.0–16.0)

## 2021-11-02 LAB — MAGNESIUM: Magnesium: 2.8 mg/dL — ABNORMAL HIGH (ref 1.7–2.4)

## 2021-11-02 LAB — PHOSPHORUS: Phosphorus: 4.2 mg/dL (ref 2.5–4.6)

## 2021-11-02 MED ORDER — "THROMBI-PAD 3""X3"" EX PADS"
1.0000 | MEDICATED_PAD | Freq: Once | CUTANEOUS | Status: AC
  Administered 2021-11-02: 1 via TOPICAL
  Filled 2021-11-02: qty 1

## 2021-11-02 MED ORDER — INSULIN ASPART 100 UNIT/ML IJ SOLN
0.0000 [IU] | INTRAMUSCULAR | Status: DC
  Administered 2021-11-02: 11 [IU] via SUBCUTANEOUS
  Administered 2021-11-02: 7 [IU] via SUBCUTANEOUS
  Administered 2021-11-02: 4 [IU] via SUBCUTANEOUS
  Administered 2021-11-03: 15 [IU] via SUBCUTANEOUS
  Administered 2021-11-03: 11 [IU] via SUBCUTANEOUS
  Administered 2021-11-03 (×4): 7 [IU] via SUBCUTANEOUS
  Administered 2021-11-03: 4 [IU] via SUBCUTANEOUS
  Administered 2021-11-04: 11 [IU] via SUBCUTANEOUS
  Administered 2021-11-04 (×2): 7 [IU] via SUBCUTANEOUS
  Administered 2021-11-04: 15 [IU] via SUBCUTANEOUS
  Administered 2021-11-04 – 2021-11-05 (×3): 7 [IU] via SUBCUTANEOUS
  Administered 2021-11-05: 4 [IU] via SUBCUTANEOUS
  Administered 2021-11-05: 11 [IU] via SUBCUTANEOUS
  Administered 2021-11-05 (×3): 7 [IU] via SUBCUTANEOUS
  Administered 2021-11-06 – 2021-11-07 (×8): 4 [IU] via SUBCUTANEOUS
  Administered 2021-11-07: 3 [IU] via SUBCUTANEOUS
  Administered 2021-11-07: 14 [IU] via SUBCUTANEOUS
  Administered 2021-11-07 – 2021-11-08 (×2): 4 [IU] via SUBCUTANEOUS
  Administered 2021-11-08: 7 [IU] via SUBCUTANEOUS
  Administered 2021-11-08 – 2021-11-09 (×7): 4 [IU] via SUBCUTANEOUS
  Administered 2021-11-09: 16:00:00 3 [IU] via SUBCUTANEOUS
  Administered 2021-11-09 – 2021-11-10 (×5): 4 [IU] via SUBCUTANEOUS
  Administered 2021-11-10: 3 [IU] via SUBCUTANEOUS
  Administered 2021-11-10: 4 [IU] via SUBCUTANEOUS
  Administered 2021-11-10: 3 [IU] via SUBCUTANEOUS
  Administered 2021-11-11: 11 [IU] via SUBCUTANEOUS
  Administered 2021-11-11 (×3): 3 [IU] via SUBCUTANEOUS
  Administered 2021-11-12: 11 [IU] via SUBCUTANEOUS
  Administered 2021-11-12: 7 [IU] via SUBCUTANEOUS
  Administered 2021-11-12 (×3): 4 [IU] via SUBCUTANEOUS
  Administered 2021-11-12: 7 [IU] via SUBCUTANEOUS
  Administered 2021-11-12 – 2021-11-13 (×2): 4 [IU] via SUBCUTANEOUS
  Administered 2021-11-13: 20:00:00 3 [IU] via SUBCUTANEOUS
  Administered 2021-11-13: 11:00:00 4 [IU] via SUBCUTANEOUS
  Administered 2021-11-13: 08:00:00 7 [IU] via SUBCUTANEOUS
  Administered 2021-11-13 – 2021-11-14 (×3): 4 [IU] via SUBCUTANEOUS

## 2021-11-02 MED ORDER — HYDROCORTISONE SOD SUC (PF) 100 MG IJ SOLR
100.0000 mg | Freq: Every day | INTRAMUSCULAR | Status: DC
Start: 2021-11-03 — End: 2021-11-04
  Administered 2021-11-03 – 2021-11-04 (×2): 100 mg via INTRAVENOUS
  Filled 2021-11-02 (×2): qty 2

## 2021-11-02 MED ORDER — HEPARIN (PORCINE) 25000 UT/250ML-% IV SOLN
1000.0000 [IU]/h | INTRAVENOUS | Status: DC
  Administered 2021-11-02 – 2021-11-05 (×3): 650 [IU]/h via INTRAVENOUS
  Administered 2021-11-06: 800 [IU]/h via INTRAVENOUS
  Administered 2021-11-07: 900 [IU]/h via INTRAVENOUS
  Administered 2021-11-08: 1000 [IU]/h via INTRAVENOUS
  Administered 2021-11-09: 14:00:00 850 [IU]/h via INTRAVENOUS
  Administered 2021-11-10: 900 [IU]/h via INTRAVENOUS
  Administered 2021-11-12 – 2021-11-14 (×3): 1000 [IU]/h via INTRAVENOUS
  Filled 2021-11-02 (×10): qty 250

## 2021-11-02 MED ORDER — METOPROLOL TARTRATE 5 MG/5ML IV SOLN
2.5000 mg | INTRAVENOUS | Status: DC | PRN
  Administered 2021-11-02: 5 mg via INTRAVENOUS
  Filled 2021-11-02: qty 5

## 2021-11-02 MED ORDER — VASOPRESSIN 20 UNITS/100 ML INFUSION FOR SHOCK
0.0000 [IU]/min | INTRAVENOUS | Status: DC
  Administered 2021-11-02 – 2021-11-04 (×5): 0.03 [IU]/min via INTRAVENOUS
  Filled 2021-11-02 (×5): qty 100

## 2021-11-02 MED ORDER — REVEFENACIN 175 MCG/3ML IN SOLN
175.0000 ug | Freq: Every day | RESPIRATORY_TRACT | Status: DC
  Administered 2021-11-03 – 2021-11-14 (×12): 175 ug via RESPIRATORY_TRACT
  Filled 2021-11-02 (×13): qty 3

## 2021-11-02 MED ORDER — SENNOSIDES-DOCUSATE SODIUM 8.6-50 MG PO TABS: 1.0000 | ORAL_TABLET | Freq: Every evening | ORAL | Status: DC | PRN

## 2021-11-02 MED ORDER — ROCURONIUM BROMIDE 50 MG/5ML IV SOLN
80.0000 mg | Freq: Once | INTRAVENOUS | Status: AC
  Filled 2021-11-02: qty 8

## 2021-11-02 MED ORDER — ROCURONIUM BROMIDE 10 MG/ML (PF) SYRINGE
PREFILLED_SYRINGE | INTRAVENOUS | Status: AC
  Administered 2021-11-02: 80 mg via INTRAVENOUS
  Filled 2021-11-02: qty 10

## 2021-11-02 NOTE — Progress Notes (Signed)
Bleeding from all puncture sites. On heparin and therapeutic level.  Will hold heparin, send stat dic panel.

## 2021-11-02 NOTE — Progress Notes (Signed)
eLink Physician-Brief Progress Note Patient Name: Joseph [name] DOB: January 12, 1956 MRN: [id number]   Date of Service  11/02/2021  HPI/Events of Note  Hypotension with increased Norepinephrine requirement.  eICU Interventions  Restart Vasopressin IV infusion at shock dose.      Intervention Category Major Interventions: Hypotension - evaluation and management  Clarabelle Oscarson Dennard Nip 11/02/2021, 10:31 PM

## 2021-11-02 NOTE — Progress Notes (Signed)
Joseph [name] Progress Note    Assessment/ Plan:   AKI on CKD 3a - AKI likely secondary to ATN/shock. Follows with [facility] as an outpatient. Marginal response to lasix challenge. Initiated CRRT 12/5, appreciate CCM's assistance with temp line placement. Will continue CRRT for now, aiming for net neg 200cc/hr, can try for net neg 250cc/hr if possible (especially as he is intermittently hypertensive). Discussed with ICU RN. Septic/Distributive Shock-  on pressor support, receiving cefepime Acute respiratory failure with ARDS, Influenza A, Acute COPD Exacerbation - vent support per primary service, UF'ing as tolerated Anemia of CKD - Hgb 10. Transfuse if Hgb < 7.  Secondary Hyperparathyroidism -  corrected cal wnl. Monitor phos, improving on crrt CHF- cardiology on board, UF as tolerated PAF with RVR- Continue amiodarone and heparin. Cardio on board T2DM- per primary Nutrition - c/w tf's, push protein  Subjective:   No acute events. Tolerating crrt. Net neg ~3.4L. Tolerating net neg ~200cc/hr. FIO2 now 50. Remains on pressors, intermittently hypertensive-good wave form w/ A line   Objective:   BP 140/68 (BP Location: Right Arm)   Pulse (!) 107   Temp 97.7 F (36.5 C)   Resp 15   Ht _0  (1.803 m)   Wt 83.4 kg   SpO2 95%   BMI 25.64 kg/m   Intake/Output Summary (Last 24 hours) at 11/02/2021 0815 Last data filed at 11/02/2021 0800 Gross per 24 hour  Intake 3527.99 ml  Output 7225 ml  Net -3697.01 ml   Weight change: -4.5 kg  Physical Exam: Gen:ill appearing, intubated/sedated HUT:MLYYT irreg Resp:diminished air entry bibasilar, intubated KPT:WSFK, nt/nd CLE:XNTZG edema bl LE's Neuro: sedated Dialysis access: rij temp line  Imaging: DG CHEST PORT 1 VIEW  Result Date: 11/01/2021 CLINICAL DATA:  Pulmonary edema EXAM: PORTABLE CHEST 1 VIEW COMPARISON:  Chest radiograph 1 day prior FINDINGS: The endotracheal tube tip is approximately 4.9 cm from the carina. A  right IJ vascular catheter terminates at the level of the cavoatrial junction. A left IJ catheter terminates in the region of the upper SVC. The left chest wall cardiac device and associated leads are stable. Median sternotomy wires and mediastinal surgical clips are stable. The enteric catheter tip is off the field of view. The cardiomediastinal silhouette is stable. There are confluent opacities throughout both lungs emanating from the hilar regions, overall slightly improved in the interim. The right costophrenic angle is cut off. There is no significant left effusion. There is no appreciable pneumothorax. IMPRESSION: 1. Support devices as above. 2. Extensive opacities throughout both lungs emanating from the hilar regions, overall slightly improved in the interim. Findings may reflect improving edema, infection, or ARDS. Electronically Signed   By: Valetta Mole M.D.   On: 11/01/2021 10:43   DG CHEST PORT 1 VIEW  Result Date: 10/31/2021 CLINICAL DATA:  Shortness of breath EXAM: PORTABLE CHEST 1 VIEW COMPARISON:  Previous studies including the examination of 10/30/2021 FINDINGS: Transverse diameter of heart is increased. Central pulmonary vessels are more prominent. Increased interstitial and alveolar densities are seen in the both parahilar regions and lower lung fields, more so on the right side. There is blunting of right lateral costophrenic angle suggesting pleural effusion with interval increase in size. There is no pneumothorax. Tip of endotracheal tube is approximately 7.7 cm above the carina. Tip of dialysis catheter placed through the right jugular vein is seen in the region of right atrium. Enteric tube is noted traversing the esophagus. Tip of left jugular central venous catheter  is seen in the region of superior vena cava. IMPRESSION: Cardiomegaly. Increased interstitial and alveolar markings are seen in both lungs, more so on the right side suggesting worsening pulmonary edema. Possibility of  underlying pneumonia is not excluded. There is interval increase in right pleural effusion. Electronically Signed   By: Elmer Picker M.D.   On: 10/31/2021 13:12    Labs: BMET Recent Labs  Lab 10/29/21 0514 10/29/21 1404 10/29/21 1710 10/29/21 1828 10/30/21 0203 10/30/21 0415 10/31/21 0455 10/31/21 1547 11/01/21 0402 11/01/21 1600 11/02/21 0300  NA 133*   < >  --    < > 130* 130* 131* 132* 133* 134* 132*  K 5.0   < >  --    < > 4.6 4.6 4.7 4.6 4.1 4.1 3.9  CL 100  --   --   --   --  97* 100 99 99 99 98  CO2 19*  --   --   --   --  17* 18* 18* 21* 23 23  GLUCOSE 162*  --   --   --   --  257* 254* 243* 231* 230* 236*  BUN 74*  --   --   --   --  95* 122* 126* 88* 71* 62*  CREATININE 3.31*  --   --   --   --  4.66* 5.86* 5.93* 3.82* 2.87* 2.30*  CALCIUM 8.0*  --   --   --   --  8.0* 7.6* 7.5* 7.7* 7.6* 7.7*  PHOS 6.6*  --  7.4*  --   --  6.9*  --  7.5* 5.4* 4.2 4.2   < > = values in this interval not displayed.   CBC Recent Labs  Lab 10/31/21 0455 11/01/21 0402 11/01/21 0926 11/02/21 0300  WBC 17.8* 24.3* 23.7* 23.1*  NEUTROABS  --   --  20.2*  --   HGB 9.9* 10.5* 10.3* 10.0*  HCT 29.6* 30.7* 30.3* 30.1*  MCV 86.0 84.1 83.9 85.5  PLT 165 228 209 183    Medications:     arformoterol  15 mcg Nebulization BID   vitamin C  250 mg Per Tube BID   aspirin  81 mg Per Tube Daily   atorvastatin  80 mg Per Tube QPM   B-complex with vitamin C  1 tablet Per Tube Daily   budesonide (PULMICORT) nebulizer solution  0.5 mg Nebulization BID   chlorhexidine gluconate (MEDLINE KIT)  15 mL Mouth Rinse BID   Chlorhexidine Gluconate Cloth  6 each Topical Daily   feeding supplement (PROSource TF)  90 mL Per Tube QID   hydrocortisone sod succinate (SOLU-CORTEF) inj  100 mg Intravenous Q12H   insulin aspart  0-15 Units Subcutaneous Q4H   insulin aspart  4 Units Subcutaneous Q4H   insulin glargine-yfgn  15 Units Subcutaneous BID   ipratropium  0.5 mg Nebulization TID   mouth rinse   15 mL Mouth Rinse 10 times per day   pantoprazole sodium  40 mg Per Tube Daily   polyethylene glycol  17 g Per Tube Daily   senna-docusate  1 tablet Per Tube BID   sodium bicarbonate  650 mg Per Tube TID   sodium chloride flush  10-40 mL Intracatheter Q12H      Gean Quint, MD [name] 11/02/2021, 8:15 AM

## 2021-11-02 NOTE — Progress Notes (Signed)
Right IJ Trialysis has continued to slowly bleed at insertion site despite reinforcing dressing and adding pressure. Reached out to [facility] requesting Thrombi pad. Dressing changed and thrombi pad placed. Will continue to monitor for bleeding.

## 2021-11-02 NOTE — Progress Notes (Signed)
Nutrition Follow-up  DOCUMENTATION CODES:   Non-severe (moderate) malnutrition in context of chronic illness  INTERVENTION:   Continue tube feeding via OG tube: - Vital 1.5 @ 40 ml/hr (960 ml/day) - ProSource TF 90 ml QID   Tube feeding regimen provides 1760 kcal, 153 grams of protein, and 733 ml of H2O.    Tube feeding regimen and current propofol provides 2156 total kcal (100% of needs).   - B-complex with vitamin C daily and vitamin C 250 mg BID to account for losses with CRRT  NUTRITION DIAGNOSIS:   Moderate Malnutrition related to chronic illness (CHF, COPD) as evidenced by moderate fat depletion, moderate muscle depletion.  New diagnosis after completion of NFPE  GOAL:   Patient will meet greater than or equal to 90% of their needs  Met via TF  MONITOR:   Vent status, TF tolerance, Skin, Weight trends, Labs, I & O's  REASON FOR ASSESSMENT:   Consult, Ventilator Enteral/tube feeding initiation and management  ASSESSMENT:   Patient is a 65 yo male with hx of CHF, COPD, CAD, CKD-3a who presented with acute respiratory distress, positive for influenza A and AKI.  12/02 - intubated, transferred to [facility] 12/05 - CRRT start  Discussed pt with RN and during ICU rounds. Pt tolerating current TF.  Spoke with pt's family member at bedside. She reports pt has an excellent appetite and is "eating from the moment he wakes up to the moment he goes to sleep." Pt's family members denies pt having had any recent weight changes. Per weight history in chart, weight stable between 80-83 kg over the last year. No real trends noted.  Admit weight: 80.5 kg (will use as EDW) Current weight: 83.4 kg  Pt with mild pitting edema to BUE and BLE.  Patient is currently intubated on ventilator support MV: 11.7 L/min Temp (24hrs), Avg:97.7 F (36.5 C), Min:85.1 F (29.5 C), Max:99.1 F (37.3 C) BP (a-line): 100/53 MAP (a-line): 66  Drips: Propofol: 15 ml/hr (provides 396 kcal  daily from lipid) Amiodarone Fentanyl Heparin Levophed Vasopressin: off this AM  Medications reviewed and include: vitamin C 250 mg BID, B-complex with vitamin C daily, IV solu-cortef, SSI q 4 hours, novolog 4 units q 4 hours, semglee 15 units BID, protonix, sodium bicarb 650 mg TID, IV abx  Labs reviewed: sodium 132, BUN 62, creatinine 2.30, magnesium 2.8, hemoglobin 10.0 CBG's: 231-313 x 24 hours  UOP: 150 ml x 24 hours CRRT UF: 6901 ml x 24 hours Stool: 100 ml x 24 hours via rectal tube I/O's: +991 ml since admit  NUTRITION - FOCUSED PHYSICAL EXAM:  Flowsheet Row Most Recent Value  Orbital Region Moderate depletion  Upper Arm Region Moderate depletion  Thoracic and Lumbar Region Moderate depletion  Buccal Region Unable to assess  Temple Region Moderate depletion  Clavicle Bone Region Moderate depletion  Clavicle and Acromion Bone Region Severe depletion  Scapular Bone Region Unable to assess  Dorsal Hand Moderate depletion  Patellar Region Moderate depletion  Anterior Thigh Region Moderate depletion  Posterior Calf Region Mild depletion  Edema (RD Assessment) Mild  Hair Reviewed  Eyes Unable to assess  Mouth Unable to assess  Skin Reviewed  Nails Reviewed       Diet Order:   Diet Order             Diet NPO time specified  Diet effective now                   EDUCATION  NEEDS:   Not appropriate for education at this time  Skin:  Skin Assessment: Skin Integrity Issues: DTI: coccyx Stage II: nose due to BiPAP Other: MASD to sacrum  Last BM:  11/02/21 rectal tube  Height:   Ht Readings from Last 1 Encounters:  10/28/21 '5\' 11"'  (1.803 m)    Weight:   Wt Readings from Last 1 Encounters:  11/02/21 83.4 kg    BMI:  Body mass index is 25.64 kg/m.  Estimated Nutritional Needs:   Kcal:  2000-2200  Protein:  150-170 grams  Fluid:  >/= 2.0 L    Gustavus Bryant, MS, RD, LDN Inpatient Clinical Dietitian Please see AMiON for contact  information.

## 2021-11-02 NOTE — Progress Notes (Addendum)
Advanced Heart Failure Rounding Note  PCP-Cardiologist: Minus Breeding, MD   Subjective:    Remains intubated and sedated. FiO2 100% and PEEP 10  Now on CVVHD pulling for 200-250 cc/hr, pulled off 6.9L yesterday  Co-ox 62%. Unable to get CVP, RN troubleshooting line.  VP off. On NE at 3, Vaso off  Appears to be back in sinus rhythm today. Continues on amio gtt at 30/hr  Bleeding from HD cath and Aline. Heparin held.  RN reports he follows commands   Objective:   Weight Range: 83.4 kg Body mass index is 25.64 kg/m.   Vital Signs:   Temp:  [95.5 F (35.3 C)-99.1 F (37.3 C)] 98.8 F (37.1 C) (12/07 0845) Pulse Rate:  [67-143] 103 (12/07 1114) Resp:  [13-26] 22 (12/07 1114) BP: (101-161)/(54-84) 113/54 (12/07 1114) SpO2:  [92 %-100 %] 97 % (12/07 1114) Arterial Line BP: (91-197)/(43-73) 110/52 (12/07 0845) FiO2 (%):  [40 %-100 %] 100 % (12/07 1114) Weight:  [83.4 kg] 83.4 kg (12/07 0500) Last BM Date: 11/02/21  Weight change: Filed Weights   10/31/21 0535 11/01/21 0500 11/02/21 0500  Weight: 90.4 kg 87.9 kg 83.4 kg    Intake/Output:   Intake/Output Summary (Last 24 hours) at 11/02/2021 1210 Last data filed at 11/02/2021 1100 Gross per 24 hour  Intake 3173.35 ml  Output 7152 ml  Net -3978.65 ml      Physical Exam    Unable to check CVP General:  Critically ill. Sedated HEENT: + ETT Neck: Difficult to assess JVP. R IJ HD cath, L IJ CVC.  Cor: PMI nondisplaced. Regular rate & rhythm. No rubs, gallops or murmurs. Lungs: clear Abdomen: soft, nontender, nondistended. No hepatosplenomegaly. No bruits or masses. Good bowel sounds. Extremities: no cyanosis, clubbing, rash, edema Neuro: alert & orientedx3, cranial nerves grossly intact. moves all 4 extremities w/o difficulty. Affect pleasant    Telemetry   SR/sinus tach 80s-100s with PACs  EKG    No new EKG to review    Labs    CBC Recent Labs    11/01/21 0926 11/02/21 0300 11/02/21 1003   WBC 23.7* 23.1*  --   NEUTROABS 20.2*  --   --   HGB 10.3* 10.0*  --   HCT 30.3* 30.1*  --   MCV 83.9 85.5  --   PLT 209 183 840   Basic Metabolic Panel Recent Labs    11/01/21 0402 11/01/21 1600 11/02/21 0300  NA 133* 134* 132*  K 4.1 4.1 3.9  CL 99 99 98  CO2 21* 23 23  GLUCOSE 231* 230* 236*  BUN 88* 71* 62*  CREATININE 3.82* 2.87* 2.30*  CALCIUM 7.7* 7.6* 7.7*  MG 3.0*  --  2.8*  PHOS 5.4* 4.2 4.2   Liver Function Tests Recent Labs    11/01/21 1600 11/02/21 0300  AST  --  22  ALT  --  25  ALKPHOS  --  60  BILITOT  --  0.9  PROT  --  5.1*  ALBUMIN 1.9* 1.9*   No results for input(s): LIPASE, AMYLASE in the last 72 hours. Cardiac Enzymes No results for input(s): CKTOTAL, CKMB, CKMBINDEX, TROPONINI in the last 72 hours.  BNP: BNP (last 3 results) Recent Labs    10/25/21 1945 10/28/21 1746 11/02/21 0300  BNP 383.0* 377.2* 129.5*    ProBNP (last 3 results) No results for input(s): PROBNP in the last 8760 hours.   D-Dimer Recent Labs    11/02/21 1003  DDIMER 1.16*  Hemoglobin A1C Recent Labs    11/01/21 0926  HGBA1C 6.7*   Fasting Lipid Panel Recent Labs    11/02/21 0300  TRIG 141   Thyroid Function Tests No results for input(s): TSH, T4TOTAL, T3FREE, THYROIDAB in the last 72 hours.  Invalid input(s): FREET3  Other results:   Imaging    DG CHEST PORT 1 VIEW  Result Date: 11/02/2021 CLINICAL DATA:  Intubated EXAM: PORTABLE CHEST 1 VIEW COMPARISON:  11/01/2021 FINDINGS: No significant change in AP portable chest radiograph. Extensive support apparatus includes endotracheal tube, esophagogastric tube, right neck multi lumen vascular catheter, and left neck single lumen vascular catheter. Status post median sternotomy and CABG. Unchanged diffuse bilateral interstitial and heterogeneous airspace opacity with layering bilateral pleural effusions. No new airspace opacity. IMPRESSION: 1. No significant change in AP portable chest  radiograph. 2. Extensive support apparatus, unchanged. 3. Unchanged diffuse bilateral interstitial and heterogeneous airspace opacity with layering bilateral pleural effusions, consistent with edema, infection, and/or ARDS. Electronically Signed   By: Delanna Ahmadi M.D.   On: 11/02/2021 08:39     Medications:     Scheduled Medications:  arformoterol  15 mcg Nebulization BID   vitamin C  250 mg Per Tube BID   aspirin  81 mg Per Tube Daily   atorvastatin  80 mg Per Tube QPM   B-complex with vitamin C  1 tablet Per Tube Daily   budesonide (PULMICORT) nebulizer solution  0.5 mg Nebulization BID   chlorhexidine gluconate (MEDLINE KIT)  15 mL Mouth Rinse BID   Chlorhexidine Gluconate Cloth  6 each Topical Daily   feeding supplement (PROSource TF)  90 mL Per Tube QID   [START ON 11/03/2021] hydrocortisone sod succinate (SOLU-CORTEF) inj  100 mg Intravenous Daily   insulin aspart  0-20 Units Subcutaneous Q4H   insulin aspart  4 Units Subcutaneous Q4H   insulin glargine-yfgn  15 Units Subcutaneous BID   mouth rinse  15 mL Mouth Rinse 10 times per day   pantoprazole sodium  40 mg Per Tube Daily   revefenacin  175 mcg Nebulization Daily   rocuronium  80 mg Intravenous Once   rocuronium bromide       sodium bicarbonate  650 mg Per Tube TID   sodium chloride flush  10-40 mL Intracatheter Q12H    Infusions:   prismasol BGK 4/2.5 500 mL/hr at 11/02/21 0732   sodium chloride 10 mL/hr at 11/02/21 1100   amiodarone 30 mg/hr (11/02/21 1100)   ceFEPime (MAXIPIME) IV Stopped (11/02/21 0432)   feeding supplement (VITAL 1.5 CAL) 1,000 mL (11/01/21 1800)   fentaNYL infusion INTRAVENOUS 150 mcg/hr (11/02/21 1100)   heparin Stopped (11/02/21 0923)   norepinephrine (LEVOPHED) Adult infusion 12 mcg/min (11/02/21 1100)   prismasol BGK 2/2.5 dialysis solution 1,500 mL/hr at 11/02/21 1103   prismasol BGK 2/2.5 replacement solution 300 mL/hr at 11/02/21 0103   promethazine (PHENERGAN) injection (IM or IVPB)      propofol (DIPRIVAN) infusion 20 mcg/kg/min (11/02/21 1100)    PRN Medications: acetaminophen **OR** acetaminophen, albuterol, fentaNYL, heparin, metoprolol tartrate, polyethylene glycol, promethazine (PHENERGAN) injection (IM or IVPB), senna-docusate, sodium chloride flush    Patient Profile   65 y/o male w/ CAD, chronic systolic heart failure and CKD IIIb, admitted w/ Influenza A, sepsis PNA, acute hypoxic respiratory failure and AKI on CKD.   Assessment/Plan   1. Septic/distributive shock - Initial Co-ox 62% - Off VP, can wean NE as tolerated - Continue cefepime and stress-dose steroids - he is volume  replete/overloaded after fluid resuscitation   2. Acute hypoxic respiratory failure - has PNA/ARDS with diffuse lung injury on CXR - continue vent support - now w/ pulmonary edema post volume resuscitation>>CVVHD for volume removal.   3. Acute on chronic systolic HF due to iCM - EF stable 35-40% on echo - Volume overloaded in setting of fluid resuscitation for sepsis - Unable to get CVP. RN troubleshooting line - poor response to high dose IV diuretics in setting of AKI 2/2 ATN  - Now on CVVHD for volume removal, down 10 lb overnight, 6 lb above admit weight - Co-ox 62% on 11 NE  - Off GDMT due to shock/AKI   4. PAF with RVR - rhythm appears to be sinus this am. Will check ECG to confirm - continue amio gtt.  - Heparin on hold d/t bleeding from R IJ HD catheter and Aline. Hgb 10   5. AKI on CKD 3b due to ATN/shock - baseline Scr 1.5-2.0. Peaked to 5.86 - still making urine - renal u/s with medico-renal disease in R>L kidney - Keep MAP > 70 - CVVHD initiated 12/5   7. CAD with elevated trop due to demand ischemia - stable.  - continue ASA/statin - doubt ischemia is a major issue here  - no b-blocker with shock   Length of Stay: [place], LINDSAY N, PA-C  11/02/2021, 12:10 PM  Advanced Heart Failure Team Pager [phone] (M-F; 7a - 5p)  Please contact [facility] for night-coverage after hours (5p -7a ) and weekends on [email or web address]  Agree with above  Remains on vent. VP weaned off and was on NE alone this am but pressor requirements increasing tonight and now back on VP. Co-ox 62%. Remains anuric. On CVVHD, Back in NSR on IV amio. CXR clearing some at the bases but still requiring 100% fI)2.   General:  Sedated on vent.  Ill-appearing HEENT: normal + ETT Neck: supple.JVP 10   + RIJ HD cathCarotids 2+ bilat; no bruits. No lymphadenopathy or thryomegaly appreciated. Cor: PMI nondisplaced. Regular tachy  Lungs: coarse Abdomen: soft, nontender, nondistended. No hepatosplenomegaly. No bruits or masses. Good bowel sounds. Extremities: no cyanosis, clubbing, 1+ edema Neuro: intubated sedated  He remains critically ill with MSOF. Pressor requirements now going back up. Agree with restarting VP. Can keep him even on CVVHD as needed. He is back in NSR on IV amio, Heparin on hold due to oozing around lines. Would restart as able,   CRITICAL CARE Performed by: Glori Bickers  Total critical care time: 35 minutes  Critical care time was exclusive of separately billable procedures and treating other patients.  Critical care was necessary to treat or prevent imminent or life-threatening deterioration.  Critical care was time spent personally by me (independent of midlevel providers or residents) on the following activities: development of treatment plan with patient and/or surrogate as well as nursing, discussions with consultants, evaluation of patient's response to treatment, examination of patient, obtaining history from patient or surrogate, ordering and performing treatments and interventions, ordering and review of laboratory studies, ordering and review of radiographic studies, pulse oximetry and re-evaluation of patient's condition.  Glori Bickers, MD  11:41 PM

## 2021-11-02 NOTE — Progress Notes (Signed)
Notified E-link regarding hypotension despite levophed infusing at 61mcg/min. E-link MD Dr. Arsenio Loader, gave verbal order to restart Vasopressin. BP improving with the support of vaso. Will continue to monitor closely throughout shift.

## 2021-11-02 NOTE — Progress Notes (Addendum)
ANTICOAGULATION CONSULT NOTE  Pharmacy Consult for Heparin  Indication: atrial fibrillation  Allergies  Allergen Reactions   Entresto [Sacubitril-Valsartan]     Worsening renal function and weight gain    Patient Measurements: Height: 5\' 11"  (180.3 cm) Weight: 83.4 kg (183 lb 13.8 oz) IBW/kg (Calculated) : 75.3 Heparin Dosing Weight: 80.3 kg   Vital Signs: Temp: 97.7 F (36.5 C) (12/07 0645) Temp Source: Esophageal (12/07 0600) BP: 140/68 (12/07 0600) Pulse Rate: 84 (12/07 0645)  Labs: Recent Labs    11/01/21 0402 11/01/21 0926 11/01/21 1408 11/01/21 1600 11/02/21 0300 11/02/21 0535  HGB 10.5* 10.3*  --   --  10.0*  --   HCT 30.7* 30.3*  --   --  30.1*  --   PLT 228 209  --   --  183  --   HEPARINUNFRC 0.84*  --  0.42  --   --  0.43  CREATININE 3.82*  --   --  2.87* 2.30*  --      Estimated Creatinine Clearance: 33.2 mL/min (A) (by C-G formula based on SCr of 2.3 mg/dL (H)).  Assessment: 65 y.o. male with Afib to continue on IV heparin.  Heparin level remains therapeutic and stable.  RN reports continued oozing from trialysis catheter.  CBC stable.  Goal of Therapy:  Heparin level 0.3-0.7 units/ml Monitor platelets by anticoagulation protocol: Yes   Plan:  Continue heparin gtt at 700 units/hr Daily heparin level and CBC Monitor for bleeding resolution  Madison Albea D. 01-03-1985, PharmD, BCPS, BCCCP 11/02/2021, 8:09 AM  =============================  Addendum: Heparin has been on hold since ~0930 due to bleeding from A-line.  DIC panel negative.  OK to restart IV heparin per PA.  Restart IV heparin at a slightly lower rate of 650 units/hr Check 8 hr heparin level  Lyndell Gillyard D. 07-27-1971, PharmD, BCPS, BCCCP 11/02/2021, 2:53 PM

## 2021-11-02 NOTE — Progress Notes (Signed)
MD and PA brought to bedside audible cuff leak present and ventilator alarming due to leakage. ETT tube at 23 cm at lip where it has been previous days. Patient requiring increased o2. Having to add air multiple times to cuff and leak persisted. We set up with plans on exchanging ETT with functional ETT cuff. After we pushed fentanyl and rocuronium I took a look with Glidescope and airway was found to be just above the vocal cords. ETT advanced 3 cm to 26 at lip and Cuff re-inflated without leakage. Re-secured ETT tube and CXR stat ordered to confirm ETT position.  JD Anselm Lis [facility]se [facility] 11/02/2021, 12:13 PM  Please see [email or web address] for pager details.  From 7A-7P if no response, please call [phone]. After hours, please call ELink [phone].

## 2021-11-02 NOTE — Progress Notes (Signed)
Per MD, would like to exchange ETT to new 7.5. RT, MD and RN at bedside. Once MD used glide for visualization, MD noticed ETT above vocal cords. ETT was advanced 4cm from 22 to 26 at the lip. Chest x-ray ordered for confirmation of placement. Pt tolerated well with SVS. There is no audible cuff leak at this time.

## 2021-11-02 NOTE — Progress Notes (Signed)
NAME:  Joseph [name], MRN:  [id number], DOB:  1956/01/31, LOS: 9 ADMISSION DATE:  10/09/2021, CONSULTATION DATE:  10/28/2021 REFERRING MD:  Dr. Arbutus Leas, CHIEF COMPLAINT:  Acute hypoxic respiratory failure    History of Present Illness:  Joseph [name] is a 65 y.o. male with a PMH significant for CHF, COPD, CAD s/p CABG, HTN, HLD, MRSA bacteremia, and prior GI bleed who presented to the ED for acute respiratory distress.   On ED arrival patient was placed on BIPAP for significant hypoxia. Workup revealed patient was Flu A positive with underlying COPD exacerbation and likely CHF exacerbation with component of pulmonary edema as well. He was treated with IV diuretics, Tamaflu, CAP coverage, Amiodarone drip with beta blocker for persistent A-fib, and steroids with limited improvement in respiratory status leading to the decision to intubated and transfer to [facility] for further pulmonary and cardiac care   Pertinent  Medical History  CHF, COPD, CAD s/p CABG, HTN, HLD, MRSA bacteremia, and prior GI bleed  Significant Hospital Events: Including procedures, antibiotic start and stop dates in addition to other pertinent events   11/28 admitted at [facility] for acute hypoxic respiratory failure  12/2 intubated and [facility]   10/31/2021 CVVHD initiated  Interim History / Subjective:  Amio and heparin running SBP 180s; weaned off vaso and levo HR 110s Bare hugger in place Remains on PRVC +5 40%; on fentanyl and propofol Attempted weaning sedation and SBT and wean sedation and patient desated and RR in 40s CVVHD in place pulling 200 cc/hr; Net positive 1,370; UOP -150 cc/hr   Objective   Blood pressure 140/68, pulse 84, temperature 97.7 F (36.5 C), resp. rate 15, height 5\' 11"  (1.803 m), weight 83.4 kg, SpO2 95 %. CVP:  [0 mmHg-15 mmHg] 15 mmHg  Vent Mode: PRVC FiO2 (%):  [50 %-55 %] 50 % Set Rate:  [15 bmp] 15 bmp Vt Set:  [600 mL] 600 mL PEEP:  [5 cmH20] 5 cmH20 Plateau  Pressure:  [17 cmH20-22 cmH20] 21 cmH20   Intake/Output Summary (Last 24 hours) at 11/02/2021 0703 Last data filed at 11/02/2021 0700 Gross per 24 hour  Intake 3549.6 ml  Output 7151 ml  Net -3601.4 ml    Filed Weights   10/31/21 0535 11/01/21 0500 11/02/21 0500  Weight: 90.4 kg 87.9 kg 83.4 kg    Examination: General: critically ill appearing on mech vent HEENT: MM pink/moist; ETT, L triple CVL and R HD cath in place Neuro: sedated; (wiggles toes and opens eyes when weaned off sedation) CV: s1s2, tachy 110s, no m/r/g PULM:  dim BS bilaterally; on mech vent PRVC 40% +5 peep GI: soft, bsx4 active; flexiseal in place Extremities: warm/dry, no edema  Skin: no rashes or lesions  Cxr 12/6: no significant change b/l infiltrate vs edema from yesterday  Resolved Hospital Problem list     Assessment & Plan:  Acute Hypoxic and Hypercapnic Respiratory Failure with moderate ARDS  Flu A positive : received tamiflu Acute COPD exacerbation  Some volume overload still to be dealt with but I think he does have ARDS in setting of flu, possible superimposed HAP.  P: -continue PRVC 6-8 cc/kg -wean fio2 for sats >90% -VAP prevention in place -Rass goal -2 -Attempted weaning sedation and SBT and wean sedation and patient desated and RR in 40s -continue Cefepime (day 6) -continue brovana, pulmicort, yupelri -prn albuterol for wheezing -continue stress dose IV steroids -continue CRRT  Shock Distributive based on coox  and warm BLE ext on exam. ? Septic and effect of sedation. P: -weaned off levo and vaso today -MAP goal >65 -will wean stress dose steroids to QD -coox 61; CVP 9  Leukocytosis Hypothermic: began when started on CVVHD  P: -continue cefepime -trend WBC/fever curve -bare hugger in place -follow sputum culture  AKI, oliguric: CVVHD started 12/5 -Baseline creatinine 1.4-1.6 with admission creatinine 2.01 P: -continue CVVHD; pulling 200 cc/hr -Trend BMP / urinary  output -Replace electrolytes as indicated -Avoid nephrotoxic agents, ensure adequate renal perfusion  Hx of HFrEF with ischemic cardiomyopathy  -EF 35-40% Persistent A-Flutter/A-fib with RVR -CHA2DS2-VASc score 4 Elevated troponin with hx of CAD -s/p CABG 2000 P: - Cards following -continue heparin and amio gtt -weaned off pressors today -will put in prn BB for HTN -continue CVVHD -trend coox and CVP -strict I/O's; daily weights  Type 2 diabetes  -Home medication included Jardiance, Hgb A1C 5.6 P: -increasing to resistant SSI -weaning stress dose steroids to qd from bid -continue TF coverage 4 units q4 -semglee 15 units BID -CBG monitoring  Stage 1 coccyx pressure injury P: -Pressure alleviating devices  -continue to assess   Best Practice (right click and "Reselect all SmartList Selections" daily)   Diet/type: tubefeeds DVT prophylaxis: systemic heparin GI prophylaxis: PPI Lines: Central line and Dialysis Catheter Foley:  Yes, and it is still needed Code Status:  full code Last date of multidisciplinary goals of care discussion: Pending    Critical care time: 35 minutes    JD Rexene Agent [facility] 11/02/2021, 8:46 AM  Please see [email or web address] for pager details.  From 7A-7P if no response, please call [phone]. After hours, please call ELink [phone].

## 2021-11-02 NOTE — Progress Notes (Signed)
RN called RT stating that there was audible noise coming from ETT, possibly a cuff leak. RT at bedside and can hear cuff leak. RT placed 2cc of air in cuff with no improvement. RT placed additional 2cc to cuff with no improvement. MD notified.

## 2021-11-03 DIAGNOSIS — N179 Acute kidney failure, unspecified: Secondary | ICD-10-CM | POA: Diagnosis not present

## 2021-11-03 DIAGNOSIS — J9601 Acute respiratory failure with hypoxia: Secondary | ICD-10-CM | POA: Diagnosis not present

## 2021-11-03 DIAGNOSIS — I4891 Unspecified atrial fibrillation: Secondary | ICD-10-CM | POA: Diagnosis not present

## 2021-11-03 DIAGNOSIS — I255 Ischemic cardiomyopathy: Secondary | ICD-10-CM | POA: Diagnosis not present

## 2021-11-03 LAB — CBC
HCT: 31.3 % — ABNORMAL LOW (ref 39.0–52.0)
Hemoglobin: 10.1 g/dL — ABNORMAL LOW (ref 13.0–17.0)
MCH: 28.4 pg (ref 26.0–34.0)
MCHC: 32.3 g/dL (ref 30.0–36.0)
MCV: 87.9 fL (ref 80.0–100.0)
Platelets: 202 10*3/uL (ref 150–400)
RBC: 3.56 MIL/uL — ABNORMAL LOW (ref 4.22–5.81)
RDW: 14.3 % (ref 11.5–15.5)
WBC: 25.7 10*3/uL — ABNORMAL HIGH (ref 4.0–10.5)
nRBC: 0.6 % — ABNORMAL HIGH (ref 0.0–0.2)

## 2021-11-03 LAB — COOXEMETRY PANEL
Carboxyhemoglobin: 0.7 % (ref 0.5–1.5)
Methemoglobin: 1.2 % (ref 0.0–1.5)
O2 Saturation: 71 %
Total hemoglobin: 10.6 g/dL — ABNORMAL LOW (ref 12.0–16.0)

## 2021-11-03 LAB — RENAL FUNCTION PANEL
Albumin: 1.9 g/dL — ABNORMAL LOW (ref 3.5–5.0)
Albumin: 2 g/dL — ABNORMAL LOW (ref 3.5–5.0)
Anion gap: 11 (ref 5–15)
Anion gap: 11 (ref 5–15)
BUN: 62 mg/dL — ABNORMAL HIGH (ref 8–23)
BUN: 60 mg/dL — ABNORMAL HIGH (ref 8–23)
CO2: 22 mmol/L (ref 22–32)
CO2: 22 mmol/L (ref 22–32)
Calcium: 7.9 mg/dL — ABNORMAL LOW (ref 8.9–10.3)
Calcium: 8.2 mg/dL — ABNORMAL LOW (ref 8.9–10.3)
Chloride: 98 mmol/L (ref 98–111)
Chloride: 99 mmol/L (ref 98–111)
Creatinine, Ser: 2.05 mg/dL — ABNORMAL HIGH (ref 0.61–1.24)
Creatinine, Ser: 1.97 mg/dL — ABNORMAL HIGH (ref 0.61–1.24)
GFR, Estimated: 35 mL/min — ABNORMAL LOW (ref >60)
GFR, Estimated: 37 mL/min — ABNORMAL LOW (ref >60)
Glucose, Bld: 380 mg/dL — ABNORMAL HIGH (ref 70–99)
Glucose, Bld: 262 mg/dL — ABNORMAL HIGH (ref 70–99)
Phosphorus: 4.5 mg/dL (ref 2.5–4.6)
Phosphorus: 5.6 mg/dL — ABNORMAL HIGH (ref 2.5–4.6)
Potassium: 4 mmol/L (ref 3.5–5.1)
Potassium: 4 mmol/L (ref 3.5–5.1)
Sodium: 131 mmol/L — ABNORMAL LOW (ref 135–145)
Sodium: 132 mmol/L — ABNORMAL LOW (ref 135–145)

## 2021-11-03 LAB — GLUCOSE, CAPILLARY
Glucose-Capillary: 189 mg/dL — ABNORMAL HIGH (ref 70–99)
Glucose-Capillary: 207 mg/dL — ABNORMAL HIGH (ref 70–99)
Glucose-Capillary: 222 mg/dL — ABNORMAL HIGH (ref 70–99)
Glucose-Capillary: 232 mg/dL — ABNORMAL HIGH (ref 70–99)
Glucose-Capillary: 245 mg/dL — ABNORMAL HIGH (ref 70–99)
Glucose-Capillary: 301 mg/dL — ABNORMAL HIGH (ref 70–99)
Glucose-Capillary: 323 mg/dL — ABNORMAL HIGH (ref 70–99)

## 2021-11-03 LAB — MAGNESIUM: Magnesium: 2.8 mg/dL — ABNORMAL HIGH (ref 1.7–2.4)

## 2021-11-03 LAB — HEPARIN LEVEL (UNFRACTIONATED)
Heparin Unfractionated: 0.27 IU/mL — ABNORMAL LOW (ref 0.30–0.70)
Heparin Unfractionated: 0.39 IU/mL (ref 0.30–0.70)

## 2021-11-03 LAB — PROCALCITONIN: Procalcitonin: 2.99 ng/mL

## 2021-11-03 MED ORDER — INSULIN GLARGINE-YFGN 100 UNIT/ML ~~LOC~~ SOLN
25.0000 [IU] | Freq: Two times a day (BID) | SUBCUTANEOUS | Status: DC
  Administered 2021-11-03 (×2): 25 [IU] via SUBCUTANEOUS
  Filled 2021-11-03 (×4): qty 0.25

## 2021-11-03 MED ORDER — MIDODRINE HCL 5 MG PO TABS
10.0000 mg | ORAL_TABLET | Freq: Three times a day (TID) | ORAL | Status: DC
Start: 2021-11-03 — End: 2021-11-08
  Administered 2021-11-03 – 2021-11-06 (×11): 10 mg
  Filled 2021-11-03 (×12): qty 2

## 2021-11-03 NOTE — Progress Notes (Signed)
ANTICOAGULATION & ANTIBIOTIC CONSULT NOTE  Pharmacy Consult for Heparin + Cefepime Indication: atrial fibrillation + PNA/sepsis  Allergies  Allergen Reactions   Entresto [Sacubitril-Valsartan]     Worsening renal function and weight gain    Patient Measurements: Height: 5\' 11"  (180.3 cm) Weight: 81.2 kg (179 lb 0.2 oz) IBW/kg (Calculated) : 75.3 Heparin Dosing Weight: 80.3 kg   Vital Signs: Temp: 97.6 F (36.4 C) (12/08 0735) Temp Source: Oral (12/08 0735) BP: 124/65 (12/08 0715) Pulse Rate: 93 (12/08 0735)  Labs: Recent Labs    11/01/21 0926 11/01/21 1408 11/02/21 0300 11/02/21 0535 11/02/21 1003 11/02/21 1642 11/03/21 0001 11/03/21 0231 11/03/21 0452  HGB 10.3*  --  10.0*  --   --   --   --  10.1*  --   HCT 30.3*  --  30.1*  --   --   --   --  31.3*  --   PLT 209  --  183  --  160  --   --  202  --   APTT  --   --   --   --  37*  --   --   --   --   LABPROT  --   --   --   --  13.8  --   --   --   --   INR  --   --   --   --  1.1  --   --   --   --   HEPARINUNFRC  --    < >  --  0.43  --   --  0.27*  --  0.39  CREATININE  --    < > 2.30*  --   --  2.17*  --  2.05*  --    < > = values in this interval not displayed.     Estimated Creatinine Clearance: 37.2 mL/min (A) (by C-G formula based on SCr of 2.05 mg/dL (H)).  Assessment: 65 y.o. male with Afib to continue on IV heparin.  Heparin level therapeutic.  Bleeding from lines has resolved per discussion with RN.  CBC stable.  Pharmacy also dosing cefepime for PNA/sepsis.  He is tolerating CRRT. Afebrile, WBC fluctuating, now back up to 25.7.  Doxy 11/29 >> 12/2 CTX 11/29 >> 12/2 Vanc 12/2 >> 12/3 Cefepime 12/2 >> Tamiflu 11/28 >>12/3   11/28 BCx - negative 11/28 Flu A - positive 11/29 MRSA PCR - negative 12/2 TA - negative 12/6 TA - mod yeast, preliminary  Goal of Therapy:  Heparin level 0.3-0.7 units/ml Monitor platelets by anticoagulation protocol: Yes   Plan:  Continue heparin gtt at 650  units/hr Daily heparin level and CBC Monitor for bleeding resolution  Continue cefepime 2gm IV Q12H through 12/11 Pharmacy will sign off abx dosing and follow peripherally  Florene Brill D. 01-03-1985, PharmD, BCPS, BCCCP 11/03/2021, 8:06 AM

## 2021-11-03 NOTE — Progress Notes (Addendum)
Advanced Heart Failure Rounding Note  PCP-Cardiologist: Minus Breeding, MD   Subjective:    Remains on CVVHD, aiming for neg 200/hr. Pulled off 6L yesterday. Down 4 lb   Intubated and sedated  Co-ox 71%. Increased pressor requirements last night.  Able to go down on NE and Vaso this am. Midodrine 10 TID added.   Appears to be back in AF today 90s - 100s with runs NSVT (longest 15beats).  Continues on amio gtt   Objective:   Weight Range: 81.2 kg Body mass index is 24.97 kg/m.   Vital Signs:   Temp:  [96.4 F (35.8 C)-99.2 F (37.3 C)] 97.6 F (36.4 C) (12/08 0735) Pulse Rate:  [71-123] 72 (12/08 1200) Resp:  [10-27] 24 (12/08 1200) BP: (87-157)/(54-108) 108/80 (12/08 1200) SpO2:  [92 %-100 %] 100 % (12/08 1200) Arterial Line BP: (86-175)/(42-64) 107/52 (12/08 1200) FiO2 (%):  [40 %-50 %] 40 % (12/08 1119) Weight:  [81.2 kg] 81.2 kg (12/08 0500) Last BM Date: 11/03/21  Weight change: Filed Weights   11/01/21 0500 11/02/21 0500 11/03/21 0500  Weight: 87.9 kg 83.4 kg 81.2 kg    Intake/Output:   Intake/Output Summary (Last 24 hours) at 11/03/2021 1213 Last data filed at 11/03/2021 1200 Gross per 24 hour  Intake 2980.17 ml  Output 6197 ml  Net -3216.83 ml      Physical Exam    CVP 5 General:  Sedated. Critically ill. HEENT: normal Neck: supple.  No JVD. R IJ HD cath, L IJ CVC. Carotids 2+ bilat; no bruits. No lymphadenopathy or thryomegaly appreciated. Cor: PMI nondisplaced. Irregular rhythm. Mildly tachy. No rubs, gallops or murmurs. Lungs: coarse Abdomen: soft, nontender, nondistended. No hepatosplenomegaly. No bruits or masses. Good bowel sounds. Extremities: no cyanosis, clubbing, rash, trace edema Neuro: alert & orientedx3, cranial nerves grossly intact. moves all 4 extremities w/o difficulty. Affect pleasant     Telemetry   AF 90s - 100s, NSVT runs up to 15 beats  EKG    No new EKG to review    Labs    CBC Recent Labs     11/01/21 0926 11/02/21 0300 11/02/21 1003 11/03/21 0231  WBC 23.7* 23.1*  --  25.7*  NEUTROABS 20.2*  --   --   --   HGB 10.3* 10.0*  --  10.1*  HCT 30.3* 30.1*  --  31.3*  MCV 83.9 85.5  --  87.9  PLT 209 183 160 063   Basic Metabolic Panel Recent Labs    11/02/21 0300 11/02/21 1642 11/03/21 0231  NA 132* 134* 131*  K 3.9 4.7 4.0  CL 98 101 98  CO2 _0 GLUCOSE 236* 209* 380*  BUN 62* 58* 62*  CREATININE 2.30* 2.17* 2.05*  CALCIUM 7.7* 8.2* 7.9*  MG 2.8*  --  2.8*  PHOS 4.2 4.8* 4.5   Liver Function Tests Recent Labs    11/02/21 0300 11/02/21 1642 11/03/21 0231  AST 22  --   --   ALT 25  --   --   ALKPHOS 60  --   --   BILITOT 0.9  --   --   PROT 5.1*  --   --   ALBUMIN 1.9* 2.1* 1.9*   No results for input(s): LIPASE, AMYLASE in the last 72 hours. Cardiac Enzymes No results for input(s): CKTOTAL, CKMB, CKMBINDEX, TROPONINI in the last 72 hours.  BNP: BNP (last 3 results) Recent Labs    10/25/21 1945 10/28/21 1746 11/02/21 0300  BNP 383.0* 377.2* 129.5*    ProBNP (last 3 results) No results for input(s): PROBNP in the last 8760 hours.   D-Dimer Recent Labs    11/02/21 1003  DDIMER 1.16*   Hemoglobin A1C Recent Labs    11/01/21 0926  HGBA1C 6.7*   Fasting Lipid Panel Recent Labs    11/02/21 0300  TRIG 141   Thyroid Function Tests No results for input(s): TSH, T4TOTAL, T3FREE, THYROIDAB in the last 72 hours.  Invalid input(s): FREET3  Other results:   Imaging    DG CHEST PORT 1 VIEW  Result Date: 11/02/2021 CLINICAL DATA:  Intubation of airway EXAM: PORTABLE CHEST 1 VIEW COMPARISON:  11/02/2021 FINDINGS: Endotracheal tube, right IJ central line, left IJ central line, and enteric tube remain present. Persistent bilateral opacities particularly in the upper lungs. Improved aeration at the lung bases. Likely decreased layering pleural effusions. No pneumothorax. Stable cardiomediastinal contours. Left chest wall ICD.  IMPRESSION: Stable lines and tubes. Improved aeration at the lung bases with likely decreased layering pleural effusions. Persistent bilateral upper lung opacities. Electronically Signed   By: Macy Mis M.D.   On: 11/02/2021 13:28     Medications:     Scheduled Medications:  arformoterol  15 mcg Nebulization BID   vitamin C  250 mg Per Tube BID   aspirin  81 mg Per Tube Daily   atorvastatin  80 mg Per Tube QPM   B-complex with vitamin C  1 tablet Per Tube Daily   budesonide (PULMICORT) nebulizer solution  0.5 mg Nebulization BID   chlorhexidine gluconate (MEDLINE KIT)  15 mL Mouth Rinse BID   Chlorhexidine Gluconate Cloth  6 each Topical Daily   feeding supplement (PROSource TF)  90 mL Per Tube QID   hydrocortisone sod succinate (SOLU-CORTEF) inj  100 mg Intravenous Daily   insulin aspart  0-20 Units Subcutaneous Q4H   insulin aspart  4 Units Subcutaneous Q4H   insulin glargine-yfgn  25 Units Subcutaneous BID   mouth rinse  15 mL Mouth Rinse 10 times per day   midodrine  10 mg Per Tube Q8H   pantoprazole sodium  40 mg Per Tube Daily   revefenacin  175 mcg Nebulization Daily   sodium bicarbonate  650 mg Per Tube TID   sodium chloride flush  10-40 mL Intracatheter Q12H    Infusions:   prismasol BGK 4/2.5 500 mL/hr at 11/03/21 0338   sodium chloride 10 mL/hr at 11/03/21 0900   amiodarone 30 mg/hr (11/03/21 0900)   ceFEPime (MAXIPIME) IV Stopped (11/03/21 0503)   feeding supplement (VITAL 1.5 CAL) 40 mL/hr at 11/03/21 0700   fentaNYL infusion INTRAVENOUS 150 mcg/hr (11/03/21 0900)   heparin 650 Units/hr (11/03/21 0900)   norepinephrine (LEVOPHED) Adult infusion 5 mcg/min (11/03/21 0900)   prismasol BGK 2/2.5 dialysis solution 1,500 mL/hr at 11/03/21 0093   prismasol BGK 2/2.5 replacement solution 300 mL/hr at 11/03/21 1150   promethazine (PHENERGAN) injection (IM or IVPB)     propofol (DIPRIVAN) infusion 20 mcg/kg/min (11/03/21 0900)   vasopressin Stopped (11/03/21 0820)     PRN Medications: acetaminophen **OR** acetaminophen, albuterol, fentaNYL, heparin, metoprolol tartrate, polyethylene glycol, promethazine (PHENERGAN) injection (IM or IVPB), senna-docusate, sodium chloride flush    Patient Profile   65 y/o male w/ CAD, chronic systolic heart failure and CKD IIIb, admitted w/ Influenza A, sepsis PNA, acute hypoxic respiratory failure and AKI on CKD.   Assessment/Plan   1. Septic/distributive shock - Co-ox 71% - On NE and vaso,  requirements decreasing this am. Wean as able - Continue cefepime and stress-dose steroids - CVP 5 today. Volume managed with CVVHD   2. Acute hypoxic respiratory failure - has PNA/ARDS with diffuse lung injury on CXR - continue vent support - now w/ pulmonary edema post volume resuscitation>>CVVHD for volume removal. Volume improved.   3. Acute on chronic systolic HF due to iCM - EF stable 35-40% on echo - Volume overloaded in setting of fluid resuscitation for sepsis - Unable to get CVP. RN troubleshooting line - poor response to high dose IV diuretics in setting of AKI 2/2 ATN  - Now on CVVHD for volume removal, down another 4 lb overnight and 20 lb this admit - Co-ox 62% on 8 NE + 0.03 vaso.  - Midodrine 10 TID - Off GDMT due to shock/AKI   4. PAF with RVR -SR 12/07, back in AF today - continue amio gtt.  - Continue heparin gtt. Hgb stable.  5. NSVT - Runs up to 15 beats noted - On amio gtt at 30/hr - Keep K >4, mag >2   6. AKI on CKD 3b due to ATN/shock - baseline Scr 1.5-2.0. Peaked to 5.86 - renal u/s with medico-renal disease in R>L kidney - Now on midodrine 10 TID - Keep MAP > 70 - CVVHD initiated 12/5   7. CAD with elevated trop due to demand ischemia - stable.  - continue ASA/statin - doubt ischemia is a major issue here  - no b-blocker with shock   Length of Stay: Westervelt, LINDSAY N, PA-C  11/03/2021, 12:13 PM  Advanced Heart Failure Team Pager [phone] (M-F; 7a - 5p)  Please  contact [facility] for night-coverage after hours (5p -7a ) and weekends on [email or web address]  Agree  Remains intubated/sedated. On CVVHD. Pressor requirements coming down. Fio2 1005-> 40%  Back in AF today.   General:  Intubated/sedated HEENT: normal + ETT Neck: supple. no JVD. Carotids 2+ bilat; no bruits. No lymphadenopathy or thryomegaly appreciated. Cor: PMI nondisplaced. Irregular tachy Lungs: coarse Abdomen: soft, nontender, nondistended. No hepatosplenomegaly. No bruits or masses. Good bowel sounds. Extremities: no cyanosis, clubbing, rash, 2+ edema Neuro: intubated/sedated  Remains critically ill. Continue broad spectrum abx. Keep negative on CVVHD. Wean pressors as tolerated. Will continue IV amio and heparin.   CRITICAL CARE Performed by: Glori Bickers  Total critical care time: 35 minutes  Critical care time was exclusive of separately billable procedures and treating other patients.  Critical care was necessary to treat or prevent imminent or life-threatening deterioration.  Critical care was time spent personally by me (independent of midlevel providers or residents) on the following activities: development of treatment plan with patient and/or surrogate as well as nursing, discussions with consultants, evaluation of patient's response to treatment, examination of patient, obtaining history from patient or surrogate, ordering and performing treatments and interventions, ordering and review of laboratory studies, ordering and review of radiographic studies, pulse oximetry and re-evaluation of patient's condition.  Glori Bickers, MD  6:08 PM

## 2021-11-03 NOTE — Progress Notes (Addendum)
NAME:  Joseph [name], MRN:  [id number], DOB:  19-Dec-1955, LOS: 10 ADMISSION DATE:  11-08-21, CONSULTATION DATE:  10/28/2021 REFERRING MD:  Dr. Arbutus Leas, CHIEF COMPLAINT:  Acute hypoxic respiratory failure    History of Present Illness:  Joseph [name] is a 65 y.o. male with a PMH significant for CHF, COPD, CAD s/p CABG, HTN, HLD, MRSA bacteremia, and prior GI bleed who presented to the ED for acute respiratory distress.   On ED arrival patient was placed on BIPAP for significant hypoxia. Workup revealed patient was Flu A positive with underlying COPD exacerbation and likely CHF exacerbation with component of pulmonary edema as well. He was treated with IV diuretics, Tamaflu, CAP coverage, Amiodarone drip with beta blocker for persistent A-fib, and steroids with limited improvement in respiratory status leading to the decision to intubated and transfer to [facility] for further pulmonary and cardiac care   Pertinent  Medical History  CHF, COPD, CAD s/p CABG, HTN, HLD, MRSA bacteremia, and prior GI bleed  Significant Hospital Events: Including procedures, antibiotic start and stop dates in addition to other pertinent events   11/28 [facility] for acute hypoxic respiratory failure  12/2 intubated and [facility]   10/31/2021 CVVHD initiated 12/7: off vaso; remains on levo 12/8: restart vaso  Interim History / Subjective:  Patient remains intubated on PRVC +5 peep 40% Levo 5 and vaso 0.03 HR 110s Amio and heparin gtt CVVHD pulling about 250 cc/hr; net negative 1,932.8  Objective   Blood pressure 110/75, pulse 80, temperature 97.8 F (36.6 C), temperature source Axillary, resp. rate 15, height 5\' 11"  (1.803 m), weight 81.2 kg, SpO2 95 %. CVP:  [5 mmHg-10 mmHg] 9 mmHg  Vent Mode: PRVC FiO2 (%):  [40 %-100 %] 40 % Set Rate:  [15 bmp] 15 bmp Vt Set:  [600 mL] 600 mL PEEP:  [5 cmH20-10 cmH20] 10 cmH20 Plateau Pressure:  [15 cmH20-22 cmH20] 22 cmH20   Intake/Output Summary  (Last 24 hours) at 11/03/2021 0709 Last data filed at 11/03/2021 0600 Gross per 24 hour  Intake 2859.54 ml  Output 6030 ml  Net -3170.46 ml    Filed Weights   11/01/21 0500 11/02/21 0500 11/03/21 0500  Weight: 87.9 kg 83.4 kg 81.2 kg    Examination: General:  critically ill appearing on mech vent HEENT: MM pink/moist; ETT, hd, cvl in place Neuro: sedated; cough/gag reflex present CV: s1s2, tachy 110s, no m/r/g PULM:  dim BS bilaterally; on mech vent PRVC +5 40% GI: soft, bsx4 active  Extremities: warm/dry, no edema  Skin: no rashes or lesions    Labs: Hgb 10 WBC 25  Na 131 Glucose range 208-380   Resolved Hospital Problem list     Assessment & Plan:  Acute Hypoxic and Hypercapnic Respiratory Failure with moderate ARDS  Flu A positive : received tamiflu Acute COPD exacerbation  Some volume overload still to be dealt with but I think he does have ARDS in setting of flu, possible superimposed HAP.  P: -continue PRVC 6-8 cc/kg -wean fio2 and peep for sats >90% -VAP prevention in place -Rass goal -2 -consider changing propofol to precedex when ready to wean -continue Cefepime x 10 days (day 7) -continue brovana, pulmicort, yupelri -prn albuterol for wheezing -continue stress dose IV steroids -continue CRRT per nephro  Shock Distributive based on coox and warm BLE ext on exam. ? Septic and effect of sedation. P: -continue to wean levo and vaso for MAP >65 -adding  midodrine -continue steroids qd -coox improved to 71% from 61%  Leukocytosis Hypothermic: began when started on CVVHD  P: -continue cefepime x 10 days -trend WBC/fever curve -bare hugger in place -follow sputum culture  AKI, oliguric: CVVHD started 12/5 -Baseline creatinine 1.4-1.6 with admission creatinine 2.01 P: -continue CVVHD per nephro; pulling about 250 cc/hr -Trend BMP / urinary output -Replace electrolytes as indicated -Avoid nephrotoxic agents, ensure adequate renal perfusion  Hx  of HFrEF with ischemic cardiomyopathy  -EF 35-40% Persistent A-Flutter/A-fib with RVR -CHA2DS2-VASc score 4 Elevated troponin with hx of CAD -s/p CABG 2000 P: -Cards following -continue heparin and amio gtt -continue pressors levo and vaso for MAP >65 -hold BB while hypotensive -continue CVVHD -trend coox and CVP -strict I/O's; daily weights  Type 2 diabetes  -Home medication included Jardiance, Hgb A1C 5.6 P: -continue resistant SSI -continue stress dose steroids to qd -continue TF coverage 4 units q4 -semglee increasing to 25 units BID -CBG monitoring  Stage 1 coccyx pressure injury P: -Pressure alleviating devices  -continue to assess   Best Practice (right click and "Reselect all SmartList Selections" daily)   Diet/type: tubefeeds DVT prophylaxis: systemic heparin GI prophylaxis: PPI Lines: Central line and Dialysis Catheter Foley:  Yes, and it is still needed Code Status:  full code Last date of multidisciplinary goals of care discussion: 12/8 spoke with daughter at bedside and updated on plan of care    Critical care time: 35 minutes    JD Rexene Agent [facility] 11/03/2021, 7:09 AM  Please see [email or web address] for pager details.  From 7A-7P if no response, please call [phone]. After hours, please call ELink [phone].

## 2021-11-03 NOTE — Progress Notes (Signed)
[facility] Progress Note    Assessment/ Plan:   AKI on CKD 3a - AKI likely secondary to ATN/shock. Follows with [facility] as an outpatient. Marginal response to lasix challenge. Initiated CRRT 12/5, appreciate CCM's assistance with temp line placement. Will continue CRRT for now, aiming for net neg 200cc/hr Septic/Distributive Shock-  on pressor support, receiving cefepime Acute respiratory failure with ARDS, Influenza A, Acute COPD Exacerbation - vent support per primary service, UF'ing as tolerated Anemia of CKD - Hgb 10. Transfuse if Hgb < 7.  Secondary Hyperparathyroidism -  corrected cal wnl. Monitor phos, improving on crrt CHF- cardiology on board, UF as tolerated PAF with RVR- Continue amiodarone and heparin. Cardio on board T2DM- per primary Nutrition - c/w tf's, push protein  Subjective:   Had increasing NE requirements last night. BP improved, currently off levo and vaso (BP's have been labile).   Objective:   BP 120/77   Pulse 83   Temp 97.6 F (36.4 C) (Oral)   Resp 15   Ht '5\' 11"'  (1.803 m)   Wt 81.2 kg   SpO2 100%   BMI 24.97 kg/m   Intake/Output Summary (Last 24 hours) at 11/03/2021 9381 Last data filed at 11/03/2021 0700 Gross per 24 hour  Intake 2870.67 ml  Output 5974 ml  Net -3103.33 ml   Weight change: -2.2 kg  Physical Exam: Gen:ill appearing, intubated/sedated OFB:PZWCH irreg Resp:diminished air entry bibasilar, intubated ENI:DPOE, nt/nd UMP:NTIRW dependent edema b/l LEs Neuro: sedated Dialysis access: rij temp line  Imaging: DG CHEST PORT 1 VIEW  Result Date: 11/02/2021 CLINICAL DATA:  Intubation of airway EXAM: PORTABLE CHEST 1 VIEW COMPARISON:  11/02/2021 FINDINGS: Endotracheal tube, right IJ central line, left IJ central line, and enteric tube remain present. Persistent bilateral opacities particularly in the upper lungs. Improved aeration at the lung bases. Likely decreased layering pleural effusions. No pneumothorax. Stable  cardiomediastinal contours. Left chest wall ICD. IMPRESSION: Stable lines and tubes. Improved aeration at the lung bases with likely decreased layering pleural effusions. Persistent bilateral upper lung opacities. Electronically Signed   By: Macy Mis M.D.   On: 11/02/2021 13:28   DG CHEST PORT 1 VIEW  Result Date: 11/02/2021 CLINICAL DATA:  Intubated EXAM: PORTABLE CHEST 1 VIEW COMPARISON:  11/01/2021 FINDINGS: No significant change in AP portable chest radiograph. Extensive support apparatus includes endotracheal tube, esophagogastric tube, right neck multi lumen vascular catheter, and left neck single lumen vascular catheter. Status post median sternotomy and CABG. Unchanged diffuse bilateral interstitial and heterogeneous airspace opacity with layering bilateral pleural effusions. No new airspace opacity. IMPRESSION: 1. No significant change in AP portable chest radiograph. 2. Extensive support apparatus, unchanged. 3. Unchanged diffuse bilateral interstitial and heterogeneous airspace opacity with layering bilateral pleural effusions, consistent with edema, infection, and/or ARDS. Electronically Signed   By: Delanna Ahmadi M.D.   On: 11/02/2021 08:39   DG CHEST PORT 1 VIEW  Result Date: 11/01/2021 CLINICAL DATA:  Pulmonary edema EXAM: PORTABLE CHEST 1 VIEW COMPARISON:  Chest radiograph 1 day prior FINDINGS: The endotracheal tube tip is approximately 4.9 cm from the carina. A right IJ vascular catheter terminates at the level of the cavoatrial junction. A left IJ catheter terminates in the region of the upper SVC. The left chest wall cardiac device and associated leads are stable. Median sternotomy wires and mediastinal surgical clips are stable. The enteric catheter tip is off the field of view. The cardiomediastinal silhouette is stable. There are confluent opacities throughout both lungs emanating from  the hilar regions, overall slightly improved in the interim. The right costophrenic angle is cut  off. There is no significant left effusion. There is no appreciable pneumothorax. IMPRESSION: 1. Support devices as above. 2. Extensive opacities throughout both lungs emanating from the hilar regions, overall slightly improved in the interim. Findings may reflect improving edema, infection, or ARDS. Electronically Signed   By: Valetta Mole M.D.   On: 11/01/2021 10:43    Labs: BMET Recent Labs  Lab 10/30/21 0415 10/31/21 0455 10/31/21 1547 11/01/21 0402 11/01/21 1600 11/02/21 0300 11/02/21 1642 11/03/21 0231  NA 130* 131* 132* 133* 134* 132* 134* 131*  K 4.6 4.7 4.6 4.1 4.1 3.9 4.7 4.0  CL 97* 100 99 99 99 98 101 98  CO2 17* 18* 18* 21* '23 23 24 22  ' GLUCOSE 257* 254* 243* 231* 230* 236* 209* 380*  BUN 95* 122* 126* 88* 71* 62* 58* 62*  CREATININE 4.66* 5.86* 5.93* 3.82* 2.87* 2.30* 2.17* 2.05*  CALCIUM 8.0* 7.6* 7.5* 7.7* 7.6* 7.7* 8.2* 7.9*  PHOS 6.9*  --  7.5* 5.4* 4.2 4.2 4.8* 4.5   CBC Recent Labs  Lab 11/01/21 0402 11/01/21 0926 11/02/21 0300 11/02/21 1003 11/03/21 0231  WBC 24.3* 23.7* 23.1*  --  25.7*  NEUTROABS  --  20.2*  --   --   --   HGB 10.5* 10.3* 10.0*  --  10.1*  HCT 30.7* 30.3* 30.1*  --  31.3*  MCV 84.1 83.9 85.5  --  87.9  PLT 228 209 183 160 202    Medications:     arformoterol  15 mcg Nebulization BID   vitamin C  250 mg Per Tube BID   aspirin  81 mg Per Tube Daily   atorvastatin  80 mg Per Tube QPM   B-complex with vitamin C  1 tablet Per Tube Daily   budesonide (PULMICORT) nebulizer solution  0.5 mg Nebulization BID   chlorhexidine gluconate (MEDLINE KIT)  15 mL Mouth Rinse BID   Chlorhexidine Gluconate Cloth  6 each Topical Daily   feeding supplement (PROSource TF)  90 mL Per Tube QID   hydrocortisone sod succinate (SOLU-CORTEF) inj  100 mg Intravenous Daily   insulin aspart  0-20 Units Subcutaneous Q4H   insulin aspart  4 Units Subcutaneous Q4H   insulin glargine-yfgn  25 Units Subcutaneous BID   mouth rinse  15 mL Mouth Rinse 10 times  per day   pantoprazole sodium  40 mg Per Tube Daily   revefenacin  175 mcg Nebulization Daily   sodium bicarbonate  650 mg Per Tube TID   sodium chloride flush  10-40 mL Intracatheter Q12H      Gean Quint, MD [facility] 11/03/2021, 8:23 AM

## 2021-11-03 NOTE — Progress Notes (Signed)
ANTICOAGULATION CONSULT NOTE - Follow Up Consult  Pharmacy Consult for heparin Indication: atrial fibrillation  Labs: Recent Labs    11/01/21 0402 11/01/21 0926 11/01/21 1408 11/01/21 1600 11/02/21 0300 11/02/21 0535 11/02/21 1003 11/02/21 1642 11/03/21 0001  HGB 10.5* 10.3*  --   --  10.0*  --   --   --   --   HCT 30.7* 30.3*  --   --  30.1*  --   --   --   --   PLT 228 209  --   --  183  --  160  --   --   APTT  --   --   --   --   --   --  37*  --   --   LABPROT  --   --   --   --   --   --  13.8  --   --   INR  --   --   --   --   --   --  1.1  --   --   HEPARINUNFRC 0.84*  --  0.42  --   --  0.43  --   --  0.27*  CREATININE 3.82*  --   --  2.87* 2.30*  --   --  2.17*  --     Assessment/Plan:  65yo male slightly subtherapeutic on heparin after resuming at lower rate after being held for bleeding at the A-line; night RN reports that there has been no further bleeding from A-line on his shift though there was from HD cath, now dried. Will continue infusion at current rate of 650 units/hr for now and recheck level with am labs.   Vernard Gambles, PharmD, BCPS  11/03/2021,12:51 AM

## 2021-11-04 ENCOUNTER — Inpatient Hospital Stay (HOSPITAL_COMMUNITY): Payer: Medicare HMO

## 2021-11-04 DIAGNOSIS — J9602 Acute respiratory failure with hypercapnia: Secondary | ICD-10-CM | POA: Diagnosis not present

## 2021-11-04 DIAGNOSIS — J9601 Acute respiratory failure with hypoxia: Secondary | ICD-10-CM | POA: Diagnosis not present

## 2021-11-04 LAB — CBC WITH DIFFERENTIAL/PLATELET
Abs Immature Granulocytes: 3.2 10*3/uL — ABNORMAL HIGH (ref 0.00–0.07)
Band Neutrophils: 1 %
Basophils Absolute: 0 10*3/uL (ref 0.0–0.1)
Basophils Relative: 0 %
Eosinophils Absolute: 0 10*3/uL (ref 0.0–0.5)
Eosinophils Relative: 0 %
HCT: 34.4 % — ABNORMAL LOW (ref 39.0–52.0)
Hemoglobin: 11.3 g/dL — ABNORMAL LOW (ref 13.0–17.0)
Lymphocytes Relative: 4 %
Lymphs Abs: 1.4 10*3/uL (ref 0.7–4.0)
MCH: 28.9 pg (ref 26.0–34.0)
MCHC: 32.8 g/dL (ref 30.0–36.0)
MCV: 88 fL (ref 80.0–100.0)
Metamyelocytes Relative: 4 %
Monocytes Absolute: 1.4 10*3/uL — ABNORMAL HIGH (ref 0.1–1.0)
Monocytes Relative: 4 %
Myelocytes: 4 %
Neutro Abs: 29.3 10*3/uL — ABNORMAL HIGH (ref 1.7–7.7)
Neutrophils Relative %: 82 %
Platelets: 256 10*3/uL (ref 150–400)
Promyelocytes Relative: 1 %
RBC: 3.91 MIL/uL — ABNORMAL LOW (ref 4.22–5.81)
RDW: 14.6 % (ref 11.5–15.5)
WBC: 35.3 10*3/uL — ABNORMAL HIGH (ref 4.0–10.5)
nRBC: 1.4 % — ABNORMAL HIGH (ref 0.0–0.2)
nRBC: 2 /100 WBC — ABNORMAL HIGH

## 2021-11-04 LAB — RENAL FUNCTION PANEL
Albumin: 2.3 g/dL — ABNORMAL LOW (ref 3.5–5.0)
Albumin: 2.4 g/dL — ABNORMAL LOW (ref 3.5–5.0)
Anion gap: 13 (ref 5–15)
Anion gap: 13 (ref 5–15)
BUN: 51 mg/dL — ABNORMAL HIGH (ref 8–23)
BUN: 54 mg/dL — ABNORMAL HIGH (ref 8–23)
CO2: 23 mmol/L (ref 22–32)
CO2: 21 mmol/L — ABNORMAL LOW (ref 22–32)
Calcium: 8.3 mg/dL — ABNORMAL LOW (ref 8.9–10.3)
Calcium: 8.4 mg/dL — ABNORMAL LOW (ref 8.9–10.3)
Chloride: 97 mmol/L — ABNORMAL LOW (ref 98–111)
Chloride: 98 mmol/L (ref 98–111)
Creatinine, Ser: 1.65 mg/dL — ABNORMAL HIGH (ref 0.61–1.24)
Creatinine, Ser: 1.75 mg/dL — ABNORMAL HIGH (ref 0.61–1.24)
GFR, Estimated: 45 mL/min — ABNORMAL LOW (ref >60)
GFR, Estimated: 42 mL/min — ABNORMAL LOW (ref >60)
Glucose, Bld: 237 mg/dL — ABNORMAL HIGH (ref 70–99)
Glucose, Bld: 293 mg/dL — ABNORMAL HIGH (ref 70–99)
Phosphorus: 4 mg/dL (ref 2.5–4.6)
Phosphorus: 4.4 mg/dL (ref 2.5–4.6)
Potassium: 4.3 mmol/L (ref 3.5–5.1)
Potassium: 4.1 mmol/L (ref 3.5–5.1)
Sodium: 133 mmol/L — ABNORMAL LOW (ref 135–145)
Sodium: 132 mmol/L — ABNORMAL LOW (ref 135–145)

## 2021-11-04 LAB — PROCALCITONIN: Procalcitonin: 2.56 ng/mL

## 2021-11-04 LAB — CULTURE, RESPIRATORY W GRAM STAIN

## 2021-11-04 LAB — COOXEMETRY PANEL
Carboxyhemoglobin: 1.1 % (ref 0.5–1.5)
Methemoglobin: 1 % (ref 0.0–1.5)
O2 Saturation: 87.9 %
Total hemoglobin: 11.4 g/dL — ABNORMAL LOW (ref 12.0–16.0)

## 2021-11-04 LAB — MAGNESIUM: Magnesium: 2.7 mg/dL — ABNORMAL HIGH (ref 1.7–2.4)

## 2021-11-04 LAB — GLUCOSE, CAPILLARY
Glucose-Capillary: 230 mg/dL — ABNORMAL HIGH (ref 70–99)
Glucose-Capillary: 277 mg/dL — ABNORMAL HIGH (ref 70–99)
Glucose-Capillary: 304 mg/dL — ABNORMAL HIGH (ref 70–99)
Glucose-Capillary: 243 mg/dL — ABNORMAL HIGH (ref 70–99)
Glucose-Capillary: 218 mg/dL — ABNORMAL HIGH (ref 70–99)
Glucose-Capillary: 219 mg/dL — ABNORMAL HIGH (ref 70–99)

## 2021-11-04 LAB — HEPARIN LEVEL (UNFRACTIONATED): Heparin Unfractionated: 0.33 IU/mL (ref 0.30–0.70)

## 2021-11-04 MED ORDER — SODIUM CHLORIDE 3 % IN NEBU
4.0000 mL | INHALATION_SOLUTION | Freq: Four times a day (QID) | RESPIRATORY_TRACT | Status: DC
  Administered 2021-11-04 – 2021-11-05 (×4): 4 mL via RESPIRATORY_TRACT
  Filled 2021-11-04 (×4): qty 4

## 2021-11-04 MED ORDER — DEXMEDETOMIDINE HCL IN NACL 400 MCG/100ML IV SOLN
0.0000 ug/kg/h | INTRAVENOUS | Status: DC
  Administered 2021-11-04: 0.4 ug/kg/h via INTRAVENOUS
  Filled 2021-11-04: qty 100

## 2021-11-04 MED ORDER — INSULIN GLARGINE-YFGN 100 UNIT/ML ~~LOC~~ SOLN
35.0000 [IU] | Freq: Two times a day (BID) | SUBCUTANEOUS | Status: DC
  Administered 2021-11-04 – 2021-11-12 (×17): 35 [IU] via SUBCUTANEOUS
  Filled 2021-11-04 (×19): qty 0.35

## 2021-11-04 MED ORDER — PROPOFOL 1000 MG/100ML IV EMUL
0.0000 ug/kg/min | INTRAVENOUS | Status: DC
  Administered 2021-11-04: 5 ug/kg/min via INTRAVENOUS
  Administered 2021-11-05: 20 ug/kg/min via INTRAVENOUS
  Administered 2021-11-05: 15 ug/kg/min via INTRAVENOUS
  Administered 2021-11-05: 10 ug/kg/min via INTRAVENOUS
  Administered 2021-11-06: 15 ug/kg/min via INTRAVENOUS
  Administered 2021-11-06: 30 ug/kg/min via INTRAVENOUS
  Filled 2021-11-04 (×6): qty 100

## 2021-11-04 MED ORDER — INSULIN GLARGINE-YFGN 100 UNIT/ML ~~LOC~~ SOLN
30.0000 [IU] | Freq: Two times a day (BID) | SUBCUTANEOUS | Status: DC
  Filled 2021-11-04 (×2): qty 0.3

## 2021-11-04 MED ORDER — HYDROCORTISONE SOD SUC (PF) 100 MG IJ SOLR
100.0000 mg | Freq: Two times a day (BID) | INTRAMUSCULAR | Status: DC
Start: 2021-11-04 — End: 2021-11-06
  Administered 2021-11-04 – 2021-11-05 (×3): 100 mg via INTRAVENOUS
  Filled 2021-11-04 (×3): qty 2

## 2021-11-04 NOTE — Progress Notes (Signed)
ANTICOAGULATION CONSULT NOTE  Pharmacy Consult for Heparin Indication: atrial fibrillation   Allergies  Allergen Reactions   Entresto [Sacubitril-Valsartan]     Worsening renal function and weight gain    Patient Measurements: Height: 5\' 11"  (180.3 cm) Weight: 76.6 kg (168 lb 14 oz) IBW/kg (Calculated) : 75.3 Heparin Dosing Weight: 80.3 kg   Vital Signs: Temp: 98.4 F (36.9 C) (12/09 0345) Temp Source: Oral (12/09 0345) BP: 126/78 (12/09 0800) Pulse Rate: 116 (12/09 0800)  Labs: Recent Labs    11/02/21 0300 11/02/21 0535 11/02/21 1003 11/02/21 1642 11/03/21 0001 11/03/21 0231 11/03/21 0452 11/03/21 1533 11/04/21 0421  HGB 10.0*  --   --   --   --  10.1*  --   --  11.3*  HCT 30.1*  --   --   --   --  31.3*  --   --  34.4*  PLT 183  --  160  --   --  202  --   --  256  APTT  --   --  37*  --   --   --   --   --   --   LABPROT  --   --  13.8  --   --   --   --   --   --   INR  --   --  1.1  --   --   --   --   --   --   HEPARINUNFRC  --    < >  --   --  0.27*  --  0.39  --  0.33  CREATININE 2.30*  --   --    < >  --  2.05*  --  1.97* 1.65*   < > = values in this interval not displayed.     Estimated Creatinine Clearance: 46.3 mL/min (A) (by C-G formula based on SCr of 1.65 mg/dL (H)).  Assessment: 65 y.o. male with Afib to continue on IV heparin.  Heparin level therapeutic.  Bleeding from lines has resolved per discussion with RN; there is minimal bloody secretions.  CBC stable.  Goal of Therapy:  Heparin level 0.3-0.7 units/ml Monitor platelets by anticoagulation protocol: Yes   Plan:  Continue heparin gtt at 650 units/hr Daily heparin level and CBC Monitor for bleeding resolution  Prescious Hurless D. 01-03-1985, PharmD, BCPS, BCCCP 11/04/2021, 8:18 AM

## 2021-11-04 NOTE — Progress Notes (Signed)
[facility] NEPHROLOGY PROGRESS NOTE  Assessment/ Plan:  #Acute kidney injury on CKD IIIa: likely ATN due to septic shock.  Follows with [facility] as an outpatient.  Started CRRT on 12/5 after refractory to Lasix challenge.  Tolerating UF 200 cc an hour and it seems like the volume status is gradually improving.  On pressors for hypotension.  I will lower UF goal.  Continue current CRRT prescription.  Discussed with ICU nurse.  #Acute hypoxic/hypercapnic respiratory failure/ARDS/influenza A: Currently on cefepime, vent per PCCM.  #Septic shock: Requiring levo and vasopressin.  Monitor blood pressure.  Leukocytosis worsened today.  HD catheter site is clean.  #Acute on chronic systolic heart failure due to ischemic cardiomyopathy: EF 35 to 40%.  Initially tried IV diuretics without response therefore volume management with CRRT.  Cardiology is following.  #Hyponatremia, hypervolemic: Managed with CRRT.  Discussed with ICU team.  Subjective: Seen and examined in ICU.  Requiring Levophed and vasopressin.  Blood pressure acceptable.  No issue with CRRT.  Review of system limited. Objective Vital signs in last 24 hours: Vitals:   11/04/21 0630 11/04/21 0645 11/04/21 0700 11/04/21 0800  BP:   (!) 125/93 126/78  Pulse: (!) 112 (!) 115 (!) 111 (!) 116  Resp: 15 15 (!) 22 (!) 30  Temp:      TempSrc:      SpO2: 94% 93% 95% 94%  Weight:      Height:       Weight change: -4.6 kg  Intake/Output Summary (Last 24 hours) at 11/04/2021 0813 Last data filed at 11/04/2021 0800 Gross per 24 hour  Intake 3028.56 ml  Output 6340 ml  Net -3311.44 ml       Labs: Basic Metabolic Panel: Recent Labs  Lab 11/03/21 0231 11/03/21 1533 11/04/21 0421  NA 131* 132* 133*  K 4.0 4.0 4.3  CL 98 99 97*  CO2 '22 22 23  ' GLUCOSE 380* 262* 237*  BUN 62* 60* 51*  CREATININE 2.05* 1.97* 1.65*  CALCIUM 7.9* 8.2* 8.3*  PHOS 4.5 5.6* 4.0   Liver Function Tests: Recent Labs  Lab 11/02/21 0300  11/02/21 1642 11/03/21 0231 11/03/21 1533 11/04/21 0421  AST 22  --   --   --   --   ALT 25  --   --   --   --   ALKPHOS 60  --   --   --   --   BILITOT 0.9  --   --   --   --   PROT 5.1*  --   --   --   --   ALBUMIN 1.9*   < > 1.9* 2.0* 2.3*   < > = values in this interval not displayed.   No results for input(s): LIPASE, AMYLASE in the last 168 hours. No results for input(s): AMMONIA in the last 168 hours. CBC: Recent Labs  Lab 11/01/21 0402 11/01/21 0926 11/02/21 0300 11/02/21 1003 11/03/21 0231 11/04/21 0421  WBC 24.3* 23.7* 23.1*  --  25.7* 35.3*  NEUTROABS  --  20.2*  --   --   --  29.3*  HGB 10.5* 10.3* 10.0*  --  10.1* 11.3*  HCT 30.7* 30.3* 30.1*  --  31.3* 34.4*  MCV 84.1 83.9 85.5  --  87.9 88.0  PLT 228 209 183 160 202 256   Cardiac Enzymes: No results for input(s): CKTOTAL, CKMB, CKMBINDEX, TROPONINI in the last 168 hours. CBG: Recent Labs  Lab 11/03/21 1540 11/03/21 1916 11/03/21  2309 11/04/21 0406 11/04/21 0740  GLUCAP 245* 222* 207* 230* 277*    Iron Studies: No results for input(s): IRON, TIBC, TRANSFERRIN, FERRITIN in the last 72 hours. Studies/Results: DG Chest Port 1 View  Result Date: 11/04/2021 CLINICAL DATA:  Acute respiratory failure. EXAM: PORTABLE CHEST 1 VIEW COMPARISON:  Chest x-ray dated November 02, 2021. FINDINGS: Unchanged endotracheal and enteric tubes. Unchanged right and left internal jugular central venous catheters. Unchanged left chest wall pacemaker. Stable cardiomediastinal silhouette status post CABG. Patchy airspace disease in both upper lobes are not significantly changed. Similar layering pleural effusions bilaterally. No pneumothorax. No acute osseous abnormality. IMPRESSION: 1. No significant interval change in bilateral upper lobe airspace disease and layering pleural effusions. Electronically Signed   By: Titus Dubin M.D.   On: 11/04/2021 07:56   DG CHEST PORT 1 VIEW  Result Date: 11/02/2021 CLINICAL DATA:   Intubation of airway EXAM: PORTABLE CHEST 1 VIEW COMPARISON:  11/02/2021 FINDINGS: Endotracheal tube, right IJ central line, left IJ central line, and enteric tube remain present. Persistent bilateral opacities particularly in the upper lungs. Improved aeration at the lung bases. Likely decreased layering pleural effusions. No pneumothorax. Stable cardiomediastinal contours. Left chest wall ICD. IMPRESSION: Stable lines and tubes. Improved aeration at the lung bases with likely decreased layering pleural effusions. Persistent bilateral upper lung opacities. Electronically Signed   By: Macy Mis M.D.   On: 11/02/2021 13:28    Medications: Infusions:   prismasol BGK 4/2.5 500 mL/hr at 11/04/21 0049   sodium chloride 10 mL/hr at 11/04/21 0800   amiodarone 30 mg/hr (11/04/21 0800)   ceFEPime (MAXIPIME) IV Stopped (11/04/21 0521)   feeding supplement (VITAL 1.5 CAL) 1,000 mL (11/04/21 0451)   fentaNYL infusion INTRAVENOUS 150 mcg/hr (11/04/21 0800)   heparin 650 Units/hr (11/04/21 0800)   norepinephrine (LEVOPHED) Adult infusion 20 mcg/min (11/04/21 0800)   prismasol BGK 2/2.5 dialysis solution 1,500 mL/hr at 11/04/21 0504   prismasol BGK 2/2.5 replacement solution 300 mL/hr at 11/03/21 2023   promethazine (PHENERGAN) injection (IM or IVPB)     propofol (DIPRIVAN) infusion 25 mcg/kg/min (11/04/21 0800)   vasopressin 0.03 Units/min (11/04/21 0800)    Scheduled Medications:  arformoterol  15 mcg Nebulization BID   vitamin C  250 mg Per Tube BID   aspirin  81 mg Per Tube Daily   atorvastatin  80 mg Per Tube QPM   B-complex with vitamin C  1 tablet Per Tube Daily   budesonide (PULMICORT) nebulizer solution  0.5 mg Nebulization BID   chlorhexidine gluconate (MEDLINE KIT)  15 mL Mouth Rinse BID   Chlorhexidine Gluconate Cloth  6 each Topical Daily   feeding supplement (PROSource TF)  90 mL Per Tube QID   hydrocortisone sod succinate (SOLU-CORTEF) inj  100 mg Intravenous Daily   insulin aspart   0-20 Units Subcutaneous Q4H   insulin aspart  4 Units Subcutaneous Q4H   insulin glargine-yfgn  25 Units Subcutaneous BID   mouth rinse  15 mL Mouth Rinse 10 times per day   midodrine  10 mg Per Tube Q8H   pantoprazole sodium  40 mg Per Tube Daily   revefenacin  175 mcg Nebulization Daily   sodium bicarbonate  650 mg Per Tube TID   sodium chloride flush  10-40 mL Intracatheter Q12H    have reviewed scheduled and prn medications.  Physical Exam: General: Critically ill looking male, intubated, on vent Heart:RRR, s1s2 nl Lungs: Coarse breath sound bilateral Abdomen:soft,  non-distended Extremities:No edema Dialysis  Access: Temporary HD catheter in place.  Site clean  Batu Cassin [employer] 11/04/2021,8:13 AM  LOS: 11 days

## 2021-11-04 NOTE — Progress Notes (Addendum)
NAME:  Joseph [name], MRN:  [id number], DOB:  1956-01-03, LOS: 11 ADMISSION DATE:  2021-11-22, CONSULTATION DATE:  10/28/2021 REFERRING MD:  Dr. Arbutus Leas, CHIEF COMPLAINT:  Acute hypoxic respiratory failure    History of Present Illness:  Joseph [name] is a 65 y.o. male with a PMH significant for CHF, COPD, CAD s/p CABG, HTN, HLD, MRSA bacteremia, and prior GI bleed who presented to the ED for acute respiratory distress.   On ED arrival patient was placed on BIPAP for significant hypoxia. Workup revealed patient was Flu A positive with underlying COPD exacerbation and likely CHF exacerbation with component of pulmonary edema as well. He was treated with IV diuretics, Tamaflu, CAP coverage, Amiodarone drip with beta blocker for persistent A-fib, and steroids with limited improvement in respiratory status leading to the decision to intubated and transfer to [facility] for further pulmonary and cardiac care   Pertinent  Medical History  CHF, COPD, CAD s/p CABG, HTN, HLD, MRSA bacteremia, and prior GI bleed  Significant Hospital Events: Including procedures, antibiotic start and stop dates in addition to other pertinent events   11/28 admitted at [facility] for acute hypoxic respiratory failure  12/2 intubated and [facility]   10/31/2021 CVVHD initiated; cefepime started 12/7: off vaso; remains on levo 12/8: restart vaso  Interim History / Subjective:  Patient remains intubated on +5 40%; sats 94% Levo requirements increasing; on 20 levo and 0.03 vaso Midodrine added yesterday CRRT puliing 200 cc/hr; nephro considering backing down to 50-100 cc/hr; net negative 5,220; UOP only 25 cc in 24 hours Remains on amio and heparin gtt  Objective   Blood pressure (!) 113/101, pulse (!) 115, temperature 98.4 F (36.9 C), temperature source Oral, resp. rate 15, height 5\' 11"  (1.803 m), weight 76.6 kg, SpO2 93 %. CVP:  [4 mmHg] 4 mmHg  Vent Mode: PRVC FiO2 (%):  [40 %] 40 % Set Rate:  [15 bmp] 15  bmp Vt Set:  [600 mL] 600 mL PEEP:  [5 cmH20-8 cmH20] 5 cmH20 Plateau Pressure:  [14 cmH20-24 cmH20] 14 cmH20   Intake/Output Summary (Last 24 hours) at 11/04/2021 14/07/2021 Last data filed at 11/04/2021 0600 Gross per 24 hour  Intake 2894.24 ml  Output 5980 ml  Net -3085.76 ml    Filed Weights   11/02/21 0500 11/03/21 0500 11/04/21 0500  Weight: 83.4 kg 81.2 kg 76.6 kg    Examination:  General:  critically ill appearing on mech vent HEENT: MM pink/moist; ETT, hd, cvl in place Neuro: sedated; cough/gag reflex present; small bloody/tan secretions CV: s1s2, no m/r/g PULM:  dim BS bilaterally; on mech vent PRVC +5 40% GI: soft, bsx4 active  Extremities: warm/dry, no edema  Skin: no rashes or lesions    Labs: Coox 87% (from 61%) WBC 35 Procalc 2.56 Na 133 Glucose range 207-237  CXR 12/9: similar to 12/7; ETT good position; b/l upper lung opacities   Resolved Hospital Problem list     Assessment & Plan:  Acute Hypoxic and Hypercapnic Respiratory Failure with moderate ARDS  Flu A positive : received tamiflu Acute COPD exacerbation  Some volume overload still to be dealt with but I think he does have ARDS in setting of flu, possible superimposed HAP.  P: -continue PRVC 6-8 cc/kg -wean fio2 and peep for sats >90% -VAP prevention in place -Rass goal -2 -consider changing propofol to precedex when ready to wean and help wean off pressors -continue Cefepime x 10 days (day 8) -continue  brovana, pulmicort, yupelri -prn albuterol for wheezing -continue CRRT per nephro -trend CXR  Shock: multifactorial Leukocytosis Hypothermic: began when started on CVVHD  Distributive based on coox and warm BLE ext on exam. ? Septic and effect of sedation. P: -WBC elevated to 35 and procalc 2.56 -trend WBC/fever/procalc -follow sputum culture -consider reculturing -continue to wean levo and vaso for MAP >65 -continue cefepime x 10 days (day 8) -midodrine added yesterday -continue  stress dose steroids qd -coox improved to 87% from 71% yesterday  AKI, oliguric: CVVHD started 12/5 -Baseline creatinine 1.4-1.6 with admission creatinine 2.01 P: -continue CVVHD per nephro -Trend BMP / urinary output -Replace electrolytes as indicated -Avoid nephrotoxic agents, ensure adequate renal perfusion  Hx of HFrEF with ischemic cardiomyopathy  -EF 35-40% Persistent A-Flutter/A-fib with RVR -CHA2DS2-VASc score 4 Elevated troponin with hx of CAD -s/p CABG 2000 P: -Cards following -continue heparin and amio gtt -continue pressors levo and vaso for MAP >65 -Midodrine added yesterday -continue CVVHD -trend coox and CVP -strict I/O's; daily weights -hold anti-htn meds while hypotensive  Type 2 diabetes  -Home medication included Jardiance, Hgb A1C 5.6 P: -continue resistant SSI -continue stress dose steroids qd -continue TF coverage 4 units q4 -semglee increasing to 30 units BID -CBG monitoring  Stage 1 coccyx pressure injury P: -Pressure alleviating devices  -continue to assess   Best Practice (right click and "Reselect all SmartList Selections" daily)   Diet/type: tubefeeds DVT prophylaxis: systemic heparin GI prophylaxis: PPI Lines: Central line and Dialysis Catheter Foley:  Yes, and it is still needed Code Status:  full code Last date of multidisciplinary goals of care discussion: 12/8 spoke with daughter at bedside and updated on plan of care    Critical care time: 35 minutes    JD Rexene Agent [facility] 11/04/2021, 7:12 AM  Please see [email or web address] for pager details.  From 7A-7P if no response, please call [phone]. After hours, please call ELink [phone].

## 2021-11-04 NOTE — Progress Notes (Signed)
CPT started but patients HR went into the 140s.  CPT stopped. Will attempted again next shift.

## 2021-11-04 NOTE — Progress Notes (Addendum)
Advanced Heart Failure Rounding Note  PCP-Cardiologist: Minus Breeding, MD   Subjective:    Remains intubated and sedated. FiO2 40% Continues on abx + steroids. PCT 5.05>>2.99>>2.56   On NE 12 + VP 0.03. MAPs mid 70s   Co-ox 88%   Remains in afib w/ RVR, 110s-120s. On amio gtt at [place] anuric. CVVH currently pulling 50-100/hr. CVP 6-7    Objective:   Weight Range: 76.6 kg Body mass index is 23.55 kg/m.   Vital Signs:   Temp:  [96 F (35.6 C)-98.4 F (36.9 C)] 98.4 F (36.9 C) (12/09 0845) Pulse Rate:  [72-126] 120 (12/09 0900) Resp:  [10-32] 15 (12/09 0900) BP: (87-142)/(60-110) 124/82 (12/09 0900) SpO2:  [89 %-100 %] 96 % (12/09 0900) Arterial Line BP: (86-170)/(42-70) 140/60 (12/09 0900) FiO2 (%):  [40 %] 40 % (12/09 0900) Weight:  [76.6 kg] 76.6 kg (12/09 0500) Last BM Date: 11/04/21 (flexiseal)  Weight change: Filed Weights   11/02/21 0500 11/03/21 0500 11/04/21 0500  Weight: 83.4 kg 81.2 kg 76.6 kg    Intake/Output:   Intake/Output Summary (Last 24 hours) at 11/04/2021 1012 Last data filed at 11/04/2021 1000 Gross per 24 hour  Intake 3064.81 ml  Output 6224 ml  Net -3159.19 ml      Physical Exam    CVP 6-7  General:  critically ill, intubated and sedated  HEENT: normal + ETT  Neck: supple. no JVD. +rt IJ HD cath, lt IJ CVC, Carotids 2+ bilat; no bruits. No lymphadenopathy or thyromegaly appreciated. Cor: PMI nondisplaced. Regular rate & rhythm. No rubs, gallops or murmurs. Lungs: intubated and course  Abdomen: soft, nontender, nondistended. No hepatosplenomegaly. No bruits or masses. Good bowel sounds. Extremities: no cyanosis, clubbing, rash, edema Neuro: intubated and sedated   Telemetry   Afib w/ RVR, 110s-120s  EKG    No new EKG to review    Labs    CBC Recent Labs    11/03/21 0231 11/04/21 0421  WBC 25.7* 35.3*  NEUTROABS  --  29.3*  HGB 10.1* 11.3*  HCT 31.3* 34.4*  MCV 87.9 88.0  PLT 202 259   Basic  Metabolic Panel Recent Labs    11/03/21 0231 11/03/21 1533 11/04/21 0421  NA 131* 132* 133*  K 4.0 4.0 4.3  CL 98 99 97*  CO2 _0 GLUCOSE 380* 262* 237*  BUN 62* 60* 51*  CREATININE 2.05* 1.97* 1.65*  CALCIUM 7.9* 8.2* 8.3*  MG 2.8*  --  2.7*  PHOS 4.5 5.6* 4.0   Liver Function Tests Recent Labs    11/02/21 0300 11/02/21 1642 11/03/21 1533 11/04/21 0421  AST 22  --   --   --   ALT 25  --   --   --   ALKPHOS 60  --   --   --   BILITOT 0.9  --   --   --   PROT 5.1*  --   --   --   ALBUMIN 1.9*   < > 2.0* 2.3*   < > = values in this interval not displayed.   No results for input(s): LIPASE, AMYLASE in the last 72 hours. Cardiac Enzymes No results for input(s): CKTOTAL, CKMB, CKMBINDEX, TROPONINI in the last 72 hours.  BNP: BNP (last 3 results) Recent Labs    10/25/21 1945 10/28/21 1746 11/02/21 0300  BNP 383.0* 377.2* 129.5*    ProBNP (last 3 results) No results for input(s): PROBNP in the last 8760  hours.   D-Dimer Recent Labs    11/02/21 1003  DDIMER 1.16*   Hemoglobin A1C No results for input(s): HGBA1C in the last 72 hours.  Fasting Lipid Panel Recent Labs    11/02/21 0300  TRIG 141   Thyroid Function Tests No results for input(s): TSH, T4TOTAL, T3FREE, THYROIDAB in the last 72 hours.  Invalid input(s): FREET3  Other results:   Imaging    DG Chest Port 1 View  Result Date: 11/04/2021 CLINICAL DATA:  Acute respiratory failure. EXAM: PORTABLE CHEST 1 VIEW COMPARISON:  Chest x-ray dated November 02, 2021. FINDINGS: Unchanged endotracheal and enteric tubes. Unchanged right and left internal jugular central venous catheters. Unchanged left chest wall pacemaker. Stable cardiomediastinal silhouette status post CABG. Patchy airspace disease in both upper lobes are not significantly changed. Similar layering pleural effusions bilaterally. No pneumothorax. No acute osseous abnormality. IMPRESSION: 1. No significant interval change in bilateral  upper lobe airspace disease and layering pleural effusions. Electronically Signed   By: Titus Dubin M.D.   On: 11/04/2021 07:56     Medications:     Scheduled Medications:  arformoterol  15 mcg Nebulization BID   vitamin C  250 mg Per Tube BID   aspirin  81 mg Per Tube Daily   atorvastatin  80 mg Per Tube QPM   B-complex with vitamin C  1 tablet Per Tube Daily   budesonide (PULMICORT) nebulizer solution  0.5 mg Nebulization BID   chlorhexidine gluconate (MEDLINE KIT)  15 mL Mouth Rinse BID   Chlorhexidine Gluconate Cloth  6 each Topical Daily   feeding supplement (PROSource TF)  90 mL Per Tube QID   [START ON 11/05/2021] hydrocortisone sod succinate (SOLU-CORTEF) inj  200 mg Intravenous Daily   insulin aspart  0-20 Units Subcutaneous Q4H   insulin aspart  4 Units Subcutaneous Q4H   insulin glargine-yfgn  35 Units Subcutaneous BID   mouth rinse  15 mL Mouth Rinse 10 times per day   midodrine  10 mg Per Tube Q8H   pantoprazole sodium  40 mg Per Tube Daily   revefenacin  175 mcg Nebulization Daily   sodium bicarbonate  650 mg Per Tube TID   sodium chloride flush  10-40 mL Intracatheter Q12H    Infusions:   prismasol BGK 4/2.5 500 mL/hr at 11/04/21 0049   sodium chloride 10 mL/hr at 11/04/21 1000   amiodarone 30 mg/hr (11/04/21 1000)   ceFEPime (MAXIPIME) IV Stopped (11/04/21 0521)   feeding supplement (VITAL 1.5 CAL) 1,000 mL (11/04/21 0451)   fentaNYL infusion INTRAVENOUS 150 mcg/hr (11/04/21 1000)   heparin 650 Units/hr (11/04/21 1000)   norepinephrine (LEVOPHED) Adult infusion 13 mcg/min (11/04/21 1000)   prismasol BGK 2/2.5 dialysis solution 1,500 mL/hr at 11/04/21 0829   prismasol BGK 2/2.5 replacement solution 300 mL/hr at 11/03/21 2023   promethazine (PHENERGAN) injection (IM or IVPB)     propofol (DIPRIVAN) infusion 20 mcg/kg/min (11/04/21 1000)   vasopressin 0.03 Units/min (11/04/21 1000)    PRN Medications: acetaminophen **OR** acetaminophen, albuterol,  fentaNYL, heparin, polyethylene glycol, promethazine (PHENERGAN) injection (IM or IVPB), senna-docusate, sodium chloride flush    Patient Profile   65 y/o male w/ CAD, chronic systolic heart failure and CKD IIIb, admitted w/ Influenza A, sepsis PNA, acute hypoxic respiratory failure and AKI on CKD.   Assessment/Plan   1. Septic/distributive shock - Co-ox 88% - On NE and vaso. Wean as able - Continue cefepime and stress-dose steroids - CVP 6-7 today. Volume managed with CVVHD  2. Acute hypoxic respiratory failure - has PNA/ARDS with diffuse lung injury on CXR - continue vent support - now w/ pulmonary edema post volume resuscitation>>CVVHD for volume removal. Volume improved.   3. Acute on chronic systolic HF due to iCM - EF stable 35-40% on echo - Volume overloaded in setting of fluid resuscitation for sepsis - poor response to high dose IV diuretics in setting of AKI 2/2 ATN  - Now on CVVHD for volume removal, now improved, CVP 6-7 - Co-ox 88% on 12 NE + 0.03 vaso.  - Midodrine 10 TID - Off GDMT due to shock/AKI   4. PAF with RVR - SR 12/07, back in AF today - continue amio gtt.  - Continue heparin gtt. Hgb stable.  5. NSVT - Runs up to 15 beats noted - On amio gtt at 30/hr - Keep K >4, mag >2   6. AKI on CKD 3b due to ATN/shock - baseline Scr 1.5-2.0. Peaked to 5.86 - renal u/s with medico-renal disease in R>L kidney - Now anuric. CVVHD initiated 12/5 - Now on midodrine 10 TID - Keep MAP > 70    7. CAD with elevated trop due to demand ischemia - stable.  - continue ASA/statin - doubt ischemia is a major issue here  - no b-blocker with shock   Length of Stay: [address] PA-C  11/04/2021, 10:12 AM  Advanced Heart Failure Team Pager [phone] (M-F; 7a - 5p)  Please contact [facility] for night-coverage after hours (5p -7a ) and weekends on [email or web address]  Agree with above.   Intubated/sedated. Fio2 40%  On NE and VP. Pressor needs up and  down. Remains in AF with RVR on IV amio and heparin.  CVP 6  Anuric on CVVHD. Weight below baseline  General:  Intubated/sedated HEENT: normal + ETT Neck: supple. RIJ HD cath Carotids 2+ bilat; no bruits. No lymphadenopathy or thryomegaly appreciated. Cor: PMI nondisplaced. Irregular tachy  No rubs, gallops or murmurs. Lungs: coarse Abdomen: soft, nontender, nondistended. No hepatosplenomegaly. No bruits or masses. Good bowel sounds. Extremities: no cyanosis, clubbing, rash, tr edema Neuro: sedated on vent  Suspect mostly septic shock at this point. Agree with continuing abx. CVP down to 5-6 and weight below baseline. Would keep even on CVVHD and not pull more. Continue IV amio/heparin for AF with RVR. Wean pressors as tolerated.  I will see again on Sunday. Please page with questions.   CRITICAL CARE Performed by: Glori Bickers  Total critical care time: 35 minutes  Critical care time was exclusive of separately billable procedures and treating other patients.  Critical care was necessary to treat or prevent imminent or life-threatening deterioration.  Critical care was time spent personally by me (independent of midlevel providers or residents) on the following activities: development of treatment plan with patient and/or surrogate as well as nursing, discussions with consultants, evaluation of patient's response to treatment, examination of patient, obtaining history from patient or surrogate, ordering and performing treatments and interventions, ordering and review of laboratory studies, ordering and review of radiographic studies, pulse oximetry and re-evaluation of patient's condition.  Glori Bickers, MD  6:35 PM

## 2021-11-05 DIAGNOSIS — J9602 Acute respiratory failure with hypercapnia: Secondary | ICD-10-CM | POA: Diagnosis not present

## 2021-11-05 DIAGNOSIS — J9601 Acute respiratory failure with hypoxia: Secondary | ICD-10-CM | POA: Diagnosis not present

## 2021-11-05 LAB — GLUCOSE, CAPILLARY
Glucose-Capillary: 253 mg/dL — ABNORMAL HIGH (ref 70–99)
Glucose-Capillary: 211 mg/dL — ABNORMAL HIGH (ref 70–99)
Glucose-Capillary: 207 mg/dL — ABNORMAL HIGH (ref 70–99)
Glucose-Capillary: 234 mg/dL — ABNORMAL HIGH (ref 70–99)
Glucose-Capillary: 232 mg/dL — ABNORMAL HIGH (ref 70–99)
Glucose-Capillary: 163 mg/dL — ABNORMAL HIGH (ref 70–99)

## 2021-11-05 LAB — CBC
HCT: 32.6 % — ABNORMAL LOW (ref 39.0–52.0)
Hemoglobin: 10.6 g/dL — ABNORMAL LOW (ref 13.0–17.0)
MCH: 28.7 pg (ref 26.0–34.0)
MCHC: 32.5 g/dL (ref 30.0–36.0)
MCV: 88.3 fL (ref 80.0–100.0)
Platelets: 213 10*3/uL (ref 150–400)
RBC: 3.69 MIL/uL — ABNORMAL LOW (ref 4.22–5.81)
RDW: 14.5 % (ref 11.5–15.5)
WBC: 36.9 10*3/uL — ABNORMAL HIGH (ref 4.0–10.5)
nRBC: 1.2 % — ABNORMAL HIGH (ref 0.0–0.2)

## 2021-11-05 LAB — RENAL FUNCTION PANEL
Albumin: 2.3 g/dL — ABNORMAL LOW (ref 3.5–5.0)
Albumin: 2.3 g/dL — ABNORMAL LOW (ref 3.5–5.0)
Anion gap: 10 (ref 5–15)
Anion gap: 11 (ref 5–15)
BUN: 56 mg/dL — ABNORMAL HIGH (ref 8–23)
BUN: 51 mg/dL — ABNORMAL HIGH (ref 8–23)
CO2: 22 mmol/L (ref 22–32)
CO2: 23 mmol/L (ref 22–32)
Calcium: 8.1 mg/dL — ABNORMAL LOW (ref 8.9–10.3)
Calcium: 8.4 mg/dL — ABNORMAL LOW (ref 8.9–10.3)
Chloride: 98 mmol/L (ref 98–111)
Chloride: 99 mmol/L (ref 98–111)
Creatinine, Ser: 1.68 mg/dL — ABNORMAL HIGH (ref 0.61–1.24)
Creatinine, Ser: 1.61 mg/dL — ABNORMAL HIGH (ref 0.61–1.24)
GFR, Estimated: 44 mL/min — ABNORMAL LOW (ref >60)
GFR, Estimated: 47 mL/min — ABNORMAL LOW (ref >60)
Glucose, Bld: 261 mg/dL — ABNORMAL HIGH (ref 70–99)
Glucose, Bld: 271 mg/dL — ABNORMAL HIGH (ref 70–99)
Phosphorus: 4.3 mg/dL (ref 2.5–4.6)
Phosphorus: 4.2 mg/dL (ref 2.5–4.6)
Potassium: 4 mmol/L (ref 3.5–5.1)
Potassium: 3.9 mmol/L (ref 3.5–5.1)
Sodium: 130 mmol/L — ABNORMAL LOW (ref 135–145)
Sodium: 133 mmol/L — ABNORMAL LOW (ref 135–145)

## 2021-11-05 LAB — COOXEMETRY PANEL
Carboxyhemoglobin: 0.8 % (ref 0.5–1.5)
Methemoglobin: 1.1 % (ref 0.0–1.5)
O2 Saturation: 98.5 %
Total hemoglobin: 10.9 g/dL — ABNORMAL LOW (ref 12.0–16.0)

## 2021-11-05 LAB — BASIC METABOLIC PANEL
Anion gap: 11 (ref 5–15)
BUN: 54 mg/dL — ABNORMAL HIGH (ref 8–23)
CO2: 22 mmol/L (ref 22–32)
Calcium: 8.2 mg/dL — ABNORMAL LOW (ref 8.9–10.3)
Chloride: 98 mmol/L (ref 98–111)
Creatinine, Ser: 1.71 mg/dL — ABNORMAL HIGH (ref 0.61–1.24)
GFR, Estimated: 43 mL/min — ABNORMAL LOW (ref >60)
Glucose, Bld: 265 mg/dL — ABNORMAL HIGH (ref 70–99)
Potassium: 4 mmol/L (ref 3.5–5.1)
Sodium: 131 mmol/L — ABNORMAL LOW (ref 135–145)

## 2021-11-05 LAB — PROCALCITONIN: Procalcitonin: 2.63 ng/mL

## 2021-11-05 LAB — TRIGLYCERIDES: Triglycerides: 149 mg/dL (ref <150)

## 2021-11-05 LAB — PATHOLOGIST SMEAR REVIEW

## 2021-11-05 LAB — MAGNESIUM: Magnesium: 2.9 mg/dL — ABNORMAL HIGH (ref 1.7–2.4)

## 2021-11-05 LAB — HEPARIN LEVEL (UNFRACTIONATED): Heparin Unfractionated: 0.38 IU/mL (ref 0.30–0.70)

## 2021-11-05 MED ORDER — INSULIN ASPART 100 UNIT/ML IJ SOLN
10.0000 [IU] | INTRAMUSCULAR | Status: DC
  Administered 2021-11-05 – 2021-11-12 (×41): 10 [IU] via SUBCUTANEOUS

## 2021-11-05 MED ORDER — CHLORHEXIDINE GLUCONATE CLOTH 2 % EX PADS
6.0000 | MEDICATED_PAD | Freq: Every day | CUTANEOUS | Status: DC
  Administered 2021-11-06: 6 via TOPICAL

## 2021-11-05 MED ORDER — POLYVINYL ALCOHOL 1.4 % OP SOLN
1.0000 [drp] | OPHTHALMIC | Status: DC | PRN
  Administered 2021-11-05 – 2021-11-13 (×4): 1 [drp] via OPHTHALMIC
  Filled 2021-11-05: qty 15

## 2021-11-05 NOTE — Progress Notes (Signed)
[facility] NEPHROLOGY PROGRESS NOTE  Assessment/ Plan:  #Acute kidney injury on CKD IIIa: likely ATN due to septic shock.  Follows with [facility] as an outpatient.  Started CRRT on 12/5 after refractory to Lasix challenge.  Volume status has improved therefore lower UF to 0-50 cc an hour.  Continue to require pressors.  Continue current CRRT prescription. Discussed with ICU nurse.  #Acute hypoxic/hypercapnic respiratory failure/ARDS/influenza A: Currently on cefepime, vent per PCCM.  #Septic shock: Requiring levo and vasopressin.  Monitor blood pressure.  Leukocytosis worsening.  HD catheter site is clean.  #Acute on chronic systolic heart failure due to ischemic cardiomyopathy: EF 35 to 40%.  Initially tried IV diuretics without response therefore volume management with CRRT.  Cardiology is following.  Euvolemic now.  #Hyponatremia, hypervolemic: Managed with CRRT.  Discussed with ICU team.  Subjective: Seen and examined in ICU.  No new event.  No urine output.  Levophed requirement coming down.  Also on vasopressin.  No issue with CRRT. Objective Vital signs in last 24 hours: Vitals:   11/05/21 0727 11/05/21 0741 11/05/21 0800 11/05/21 0830  BP: 94/75  (!) 160/82   Pulse: 86  95 81  Resp: 15  16 15  Temp:      TempSrc:      SpO2: 100% 100% 100% 100%  Weight:      Height:       Weight change: -3.2 kg  Intake/Output Summary (Last 24 hours) at 11/05/2021 0933 Last data filed at 11/05/2021 0900 Gross per 24 hour  Intake 3375.34 ml  Output 5277 ml  Net -1901.66 ml        Labs: Basic Metabolic Panel: Recent Labs  Lab 11/04/21 0421 11/04/21 1622 11/05/21 0314  NA 133* 132* 131*  130*  K 4.3 4.1 4.0  4.0  CL 97* 98 98  98  CO2 23 21* 22  22  GLUCOSE 237* 293* 265*  261*  BUN 51* 54* 54*  56*  CREATININE 1.65* 1.75* 1.71*  1.68*  CALCIUM 8.3* 8.4* 8.2*  8.1*  PHOS 4.0 4.4 4.3    Liver Function Tests: Recent Labs  Lab 11/02/21 0300  11/02/21 1642 11/04/21 0421 11/04/21 1622 11/05/21 0314  AST 22  --   --   --   --   ALT 25  --   --   --   --   ALKPHOS 60  --   --   --   --   BILITOT 0.9  --   --   --   --   PROT 5.1*  --   --   --   --   ALBUMIN 1.9*   < > 2.3* 2.4* 2.3*   < > = values in this interval not displayed.    No results for input(s): LIPASE, AMYLASE in the last 168 hours. No results for input(s): AMMONIA in the last 168 hours. CBC: Recent Labs  Lab 11/01/21 0926 11/02/21 0300 11/02/21 1003 11/03/21 0231 11/04/21 0421 11/05/21 0314  WBC 23.7* 23.1*  --  25.7* 35.3* 36.9*  NEUTROABS 20.2*  --   --   --  29.3*  --   HGB 10.3* 10.0*  --  10.1* 11.3* 10.6*  HCT 30.3* 30.1*  --  31.3* 34.4* 32.6*  MCV 83.9 85.5  --  87.9 88.0 88.3  PLT 209 183   < > 202 256 213   < > = values in this interval not displayed.    Cardiac Enzymes: No   results for input(s): CKTOTAL, CKMB, CKMBINDEX, TROPONINI in the last 168 hours. CBG: Recent Labs  Lab 11/04/21 1601 11/04/21 1930 11/04/21 2312 11/05/21 0318 11/05/21 0710  GLUCAP 243* 218* 219* 253* 211*     Iron Studies: No results for input(s): IRON, TIBC, TRANSFERRIN, FERRITIN in the last 72 hours. Studies/Results: DG Chest Port 1 View  Result Date: 11/04/2021 CLINICAL DATA:  Acute respiratory failure. EXAM: PORTABLE CHEST 1 VIEW COMPARISON:  Chest x-ray dated November 02, 2021. FINDINGS: Unchanged endotracheal and enteric tubes. Unchanged right and left internal jugular central venous catheters. Unchanged left chest wall pacemaker. Stable cardiomediastinal silhouette status post CABG. Patchy airspace disease in both upper lobes are not significantly changed. Similar layering pleural effusions bilaterally. No pneumothorax. No acute osseous abnormality. IMPRESSION: 1. No significant interval change in bilateral upper lobe airspace disease and layering pleural effusions. Electronically Signed   By: Titus Dubin M.D.   On: 11/04/2021 07:56     Medications: Infusions:   prismasol BGK 4/2.5 500 mL/hr at 11/05/21 2876   sodium chloride 10 mL/hr at 11/05/21 0900   amiodarone 30 mg/hr (11/05/21 0900)   ceFEPime (MAXIPIME) IV Stopped (11/05/21 0443)   feeding supplement (VITAL 1.5 CAL) 1,000 mL (11/05/21 0000)   fentaNYL infusion INTRAVENOUS 200 mcg/hr (11/05/21 0900)   heparin 650 Units/hr (11/05/21 0900)   norepinephrine (LEVOPHED) Adult infusion 1.5 mcg/min (11/05/21 0900)   prismasol BGK 2/2.5 dialysis solution 1,500 mL/hr at 11/05/21 0756   prismasol BGK 2/2.5 replacement solution 300 mL/hr at 11/05/21 0611   promethazine (PHENERGAN) injection (IM or IVPB)     propofol (DIPRIVAN) infusion 15 mcg/kg/min (11/05/21 0900)   vasopressin 0.03 Units/min (11/05/21 0900)    Scheduled Medications:  arformoterol  15 mcg Nebulization BID   vitamin C  250 mg Per Tube BID   aspirin  81 mg Per Tube Daily   atorvastatin  80 mg Per Tube QPM   B-complex with vitamin C  1 tablet Per Tube Daily   budesonide (PULMICORT) nebulizer solution  0.5 mg Nebulization BID   chlorhexidine gluconate (MEDLINE KIT)  15 mL Mouth Rinse BID   Chlorhexidine Gluconate Cloth  6 each Topical Daily   feeding supplement (PROSource TF)  90 mL Per Tube QID   hydrocortisone sod succinate (SOLU-CORTEF) inj  100 mg Intravenous BID   insulin aspart  0-20 Units Subcutaneous Q4H   insulin aspart  4 Units Subcutaneous Q4H   insulin glargine-yfgn  35 Units Subcutaneous BID   mouth rinse  15 mL Mouth Rinse 10 times per day   midodrine  10 mg Per Tube Q8H   pantoprazole sodium  40 mg Per Tube Daily   revefenacin  175 mcg Nebulization Daily   sodium bicarbonate  650 mg Per Tube TID   sodium chloride flush  10-40 mL Intracatheter Q12H    have reviewed scheduled and prn medications.  Physical Exam: General: Critically ill looking male, intubated, on vent Heart:RRR, s1s2 nl Lungs: Coarse breath sound bilateral Abdomen:soft,  non-distended Extremities:No  edema Dialysis Access: Temporary HD catheter in place.  The site looks clean. Dereke Neumann Prasad Aneta Hendershott 11/05/2021,9:33 AM  LOS: 12 days

## 2021-11-05 NOTE — Plan of Care (Signed)
  Problem: Education: Goal: Knowledge of General Education information will improve Description: Including pain rating scale, medication(s)/side effects and non-pharmacologic comfort measures Outcome: Not Progressing   Problem: Health Behavior/Discharge Planning: Goal: Ability to manage health-related needs will improve Outcome: Not Progressing   Problem: Clinical Measurements: Goal: Ability to maintain clinical measurements within normal limits will improve Outcome: Not Progressing Goal: Will remain free from infection Outcome: Not Progressing Goal: Diagnostic test results will improve Outcome: Progressing Goal: Respiratory complications will improve Outcome: Progressing   Problem: Activity: Goal: Risk for activity intolerance will decrease Outcome: Not Progressing   Problem: Nutrition: Goal: Adequate nutrition will be maintained Outcome: Progressing

## 2021-11-05 NOTE — Progress Notes (Signed)
ANTICOAGULATION CONSULT NOTE  Pharmacy Consult for Heparin Indication: atrial fibrillation   Allergies  Allergen Reactions   Entresto [Sacubitril-Valsartan]     Worsening renal function and weight gain    Patient Measurements: Height: 5\' 11"  (180.3 cm) Weight: 73.4 kg (161 lb 13.1 oz) IBW/kg (Calculated) : 75.3 Heparin Dosing Weight: 80.3 kg   Vital Signs: Temp: 97.1 F (36.2 C) (12/10 0710) Temp Source: Oral (12/10 0710) BP: 94/75 (12/10 0727) Pulse Rate: 86 (12/10 0727)  Labs: Recent Labs    11/02/21 1003 11/02/21 1642 11/03/21 0231 11/03/21 0452 11/03/21 1533 11/04/21 0421 11/04/21 1622 11/05/21 0314  HGB  --    < > 10.1*  --   --  11.3*  --  10.6*  HCT  --   --  31.3*  --   --  34.4*  --  32.6*  PLT 160  --  202  --   --  256  --  213  APTT 37*  --   --   --   --   --   --   --   LABPROT 13.8  --   --   --   --   --   --   --   INR 1.1  --   --   --   --   --   --   --   HEPARINUNFRC  --    < >  --  0.39  --  0.33  --  0.38  CREATININE  --    < > 2.05*  --    < > 1.65* 1.75* 1.71*  1.68*   < > = values in this interval not displayed.     Estimated Creatinine Clearance: 44.3 mL/min (A) (by C-G formula based on SCr of 1.68 mg/dL (H)).  Assessment: 65 y.o. male with Afib to continue on IV heparin.  Heparin level therapeutic. CBC stable.  Goal of Therapy:  Heparin level 0.3-0.7 units/ml Monitor platelets by anticoagulation protocol: Yes   Plan:  Continue heparin gtt at 650 units/hr Daily heparin level and CBC  79, PharmD, [facility] Clinical Pharmacist Please see AMION for all Pharmacists' Contact Phone Numbers 11/05/2021, 8:45 AM

## 2021-11-05 NOTE — Progress Notes (Signed)
RT NOTES: SBT attempted. Pt went apneic. Placed back on full support.

## 2021-11-05 NOTE — Progress Notes (Signed)
NAME:  Joseph [name], MRN:  [id number], DOB:  05/25/1956, LOS: 70 ADMISSION DATE:  10/06/2021, CONSULTATION DATE:  10/28/2021 REFERRING MD:  Dr. Carles Collet, CHIEF COMPLAINT:  Acute hypoxic respiratory failure    History of Present Illness:  Joseph [name] is a 65 y.o. male with a PMH significant for CHF, COPD, CAD s/p CABG, HTN, HLD, MRSA bacteremia, and prior GI bleed who presented to the ED for acute respiratory distress.   On ED arrival patient was placed on BIPAP for significant hypoxia. Workup revealed patient was Flu A positive with underlying COPD exacerbation and likely CHF exacerbation with component of pulmonary edema as well. He was treated with IV diuretics, Tamaflu, CAP coverage, Amiodarone drip with beta blocker for persistent A-fib, and steroids with limited improvement in respiratory status leading to the decision to intubated and transfer to [facility] for further pulmonary and cardiac care   Pertinent  Medical History  CHF, COPD, CAD s/p CABG, HTN, HLD, MRSA bacteremia, and prior GI bleed  Significant Hospital Events: Including procedures, antibiotic start and stop dates in addition to other pertinent events   11/28 admitted at [facility] for acute hypoxic respiratory failure  12/2 intubated and [facility]   10/31/2021 CVVHD initiated; cefepime started 12/7: off vaso; remains on levo 12/8: restart vaso  Interim History / Subjective:   Blood pressure has rebounded > currently norepi 1.5 + vasopressin Still 50cc/h negative on UF Was unable to tolerate change from propofol to precedex on 12/9, currently on propofol and fentanyl 200 Amiodarone and heparin infusions I/O- 7.5 L total   Objective   Blood pressure 102/72, pulse 84, temperature (!) 97.1 F (36.2 C), temperature source Oral, resp. rate 15, height 5\' 11"  (1.803 m), weight 73.4 kg, SpO2 100 %. CVP:  [5 mmHg-9 mmHg] 7 mmHg  Vent Mode: PRVC FiO2 (%):  [40 %] 40 % Set Rate:  [15 bmp] 15 bmp Vt Set:  [600 mL]  600 mL PEEP:  [5 cmH20] 5 cmH20 Plateau Pressure:  [15 cmH20-18 cmH20] 17 cmH20   Intake/Output Summary (Last 24 hours) at 11/05/2021 1028 Last data filed at 11/05/2021 0900 Gross per 24 hour  Intake 3267.99 ml  Output 5057 ml  Net -1789.01 ml   Filed Weights   11/03/21 0500 11/04/21 0500 11/05/21 0103  Weight: 81.2 kg 76.6 kg 73.4 kg    Examination:  General:  critically ill appearing on mech vent HEENT: MM pink/moist; ETT, hd, cvl in place Neuro: sedated; cough/gag reflex present; small bloody/tan secretions CV: s1s2, no m/r/g PULM:  dim BS bilaterally; on mech vent PRVC +5 40% GI: soft, bsx4 active  Extremities: warm/dry, no edema  Skin: no rashes or lesions    Labs: Coox 87% (from 61%) WBC 35 Procalc 2.56 Na 133 Glucose range 207-237  CXR 12/9: similar to 12/7; ETT good position; b/l upper lung opacities   Resolved Hospital Problem list     Assessment & Plan:  Acute Hypoxic and Hypercapnic Respiratory Failure with moderate ARDS  Flu A positive : received tamiflu Acute COPD exacerbation  Some volume overload still to be dealt with but I think he does have ARDS in setting of flu, possible superimposed HAP.  P: -Continue current PRVC. -Need to lighten sedation to assess for ability to perform SBT/PSV -VAP prevention orders -Continue cefepime day 9 -Brovana, Pulmicort, Yupelri -Volume removal with CVVH now at -7.5 L   Shock: multifactorial Leukocytosis Hypothermic: began when started on CVVHD  Distributive based on  coox and warm BLE ext on exam. ? Septic and effect of sedation. P: -Complete 10-day course of cefepime -Follow culture data -Should be able to keep I=O at this point with CVVHD.  We will stop UF for now -Continue midodrine -Wean norepinephrine to off -Plan to stop vasopressin today -Continue stress dose steroids  AKI, oliguric: CVVHD started 12/5 -Baseline creatinine 1.4-1.6 with admission creatinine 2.01 P: -CVVHD.  Appreciate  nephrology management.  Plan to stop Removal -Follow BMP -Question timing for HD holiday, assessment of urine output -Replace electrolytes as indicated   Hx of HFrEF with ischemic cardiomyopathy  -EF 35-40% Persistent A-Flutter/A-fib with RVR -CHA2DS2-VASc score 4 Elevated troponin with hx of CAD -s/p CABG 2000 P: -Heparin and amiodarone -Wean norepinephrine, stop vasopressin -CVP at goal -Can continue to intermittently follow Co. oximetry   Type 2 diabetes  -Home medication included Jardiance, Hgb A1C 5.6 P: -continue resistant SSI -increase TF coverage 10 units q4 -continue semglee 30 units BID -CBG monitoring  Stage 1 coccyx pressure injury P: -Pressure alleviating devices  -continue to assess   Best Practice (right click and "Reselect all SmartList Selections" daily)   Diet/type: tubefeeds DVT prophylaxis: systemic heparin GI prophylaxis: PPI Lines: Central line and Dialysis Catheter Foley:  Yes, and it is still needed Code Status:  full code Last date of multidisciplinary goals of care discussion: 12/8 spoke with daughter at bedside and updated on plan of care    Critical care time: 32 minutes    Levy Pupa, MD, PhD 11/05/2021, 10:28 AM [facility] [phone] or if no answer before 7:00PM call [phone] For any issues after 7:00PM please call eLink [phone]

## 2021-11-06 ENCOUNTER — Inpatient Hospital Stay (HOSPITAL_COMMUNITY): Payer: Medicare HMO

## 2021-11-06 DIAGNOSIS — N179 Acute kidney failure, unspecified: Secondary | ICD-10-CM | POA: Diagnosis not present

## 2021-11-06 DIAGNOSIS — J9601 Acute respiratory failure with hypoxia: Secondary | ICD-10-CM | POA: Diagnosis not present

## 2021-11-06 DIAGNOSIS — J101 Influenza due to other identified influenza virus with other respiratory manifestations: Secondary | ICD-10-CM | POA: Diagnosis not present

## 2021-11-06 DIAGNOSIS — I4891 Unspecified atrial fibrillation: Secondary | ICD-10-CM | POA: Diagnosis not present

## 2021-11-06 LAB — HEPARIN LEVEL (UNFRACTIONATED)
Heparin Unfractionated: 0.21 IU/mL — ABNORMAL LOW (ref 0.30–0.70)
Heparin Unfractionated: 0.44 IU/mL (ref 0.30–0.70)

## 2021-11-06 LAB — RENAL FUNCTION PANEL
Albumin: 2.2 g/dL — ABNORMAL LOW (ref 3.5–5.0)
Anion gap: 9 (ref 5–15)
BUN: 50 mg/dL — ABNORMAL HIGH (ref 8–23)
CO2: 23 mmol/L (ref 22–32)
Calcium: 8.2 mg/dL — ABNORMAL LOW (ref 8.9–10.3)
Chloride: 99 mmol/L (ref 98–111)
Creatinine, Ser: 1.51 mg/dL — ABNORMAL HIGH (ref 0.61–1.24)
GFR, Estimated: 50 mL/min — ABNORMAL LOW (ref >60)
Glucose, Bld: 195 mg/dL — ABNORMAL HIGH (ref 70–99)
Phosphorus: 3.5 mg/dL (ref 2.5–4.6)
Potassium: 3.8 mmol/L (ref 3.5–5.1)
Sodium: 131 mmol/L — ABNORMAL LOW (ref 135–145)

## 2021-11-06 LAB — GLUCOSE, CAPILLARY
Glucose-Capillary: 120 mg/dL — ABNORMAL HIGH (ref 70–99)
Glucose-Capillary: 175 mg/dL — ABNORMAL HIGH (ref 70–99)
Glucose-Capillary: 188 mg/dL — ABNORMAL HIGH (ref 70–99)
Glucose-Capillary: 193 mg/dL — ABNORMAL HIGH (ref 70–99)
Glucose-Capillary: 196 mg/dL — ABNORMAL HIGH (ref 70–99)
Glucose-Capillary: 200 mg/dL — ABNORMAL HIGH (ref 70–99)

## 2021-11-06 LAB — CBC
HCT: 31.6 % — ABNORMAL LOW (ref 39.0–52.0)
Hemoglobin: 10.3 g/dL — ABNORMAL LOW (ref 13.0–17.0)
MCH: 28.9 pg (ref 26.0–34.0)
MCHC: 32.6 g/dL (ref 30.0–36.0)
MCV: 88.8 fL (ref 80.0–100.0)
Platelets: 209 10*3/uL (ref 150–400)
RBC: 3.56 MIL/uL — ABNORMAL LOW (ref 4.22–5.81)
RDW: 14.6 % (ref 11.5–15.5)
WBC: 49.4 10*3/uL — ABNORMAL HIGH (ref 4.0–10.5)
nRBC: 2.1 % — ABNORMAL HIGH (ref 0.0–0.2)

## 2021-11-06 LAB — COOXEMETRY PANEL
Carboxyhemoglobin: 1.1 % (ref 0.5–1.5)
Methemoglobin: 1.2 % (ref 0.0–1.5)
O2 Saturation: 95.2 %
Total hemoglobin: 10.8 g/dL — ABNORMAL LOW (ref 12.0–16.0)

## 2021-11-06 LAB — MAGNESIUM: Magnesium: 2.9 mg/dL — ABNORMAL HIGH (ref 1.7–2.4)

## 2021-11-06 MED ORDER — HYDRALAZINE HCL 20 MG/ML IJ SOLN
10.0000 mg | Freq: Four times a day (QID) | INTRAMUSCULAR | Status: DC | PRN
  Administered 2021-11-07 – 2021-11-08 (×3): 10 mg via INTRAVENOUS
  Filled 2021-11-06 (×3): qty 1

## 2021-11-06 MED ORDER — NOREPINEPHRINE 4 MG/250ML-% IV SOLN
INTRAVENOUS | Status: AC
  Administered 2021-11-06: 1 ug/min via INTRAVENOUS
  Filled 2021-11-06: qty 250

## 2021-11-06 MED ORDER — NOREPINEPHRINE 4 MG/250ML-% IV SOLN
0.0000 ug/min | INTRAVENOUS | Status: DC
Start: 2021-11-06 — End: 2021-11-08

## 2021-11-06 MED ORDER — CHLORHEXIDINE GLUCONATE CLOTH 2 % EX PADS
6.0000 | MEDICATED_PAD | Freq: Every day | CUTANEOUS | Status: DC
  Administered 2021-11-07 – 2021-11-13 (×7): 6 via TOPICAL

## 2021-11-06 MED ORDER — DEXMEDETOMIDINE HCL IN NACL 400 MCG/100ML IV SOLN
0.0000 ug/kg/h | INTRAVENOUS | Status: AC
  Administered 2021-11-06: 0.4 ug/kg/h via INTRAVENOUS
  Administered 2021-11-06: 0.5 ug/kg/h via INTRAVENOUS
  Administered 2021-11-07: 0.3 ug/kg/h via INTRAVENOUS
  Administered 2021-11-07: 0.5 ug/kg/h via INTRAVENOUS
  Administered 2021-11-08: 0.3 ug/kg/h via INTRAVENOUS
  Filled 2021-11-06 (×5): qty 100

## 2021-11-06 MED ORDER — LABETALOL HCL 5 MG/ML IV SOLN
10.0000 mg | INTRAVENOUS | Status: DC | PRN
  Administered 2021-11-07: 10 mg via INTRAVENOUS
  Filled 2021-11-06: qty 4

## 2021-11-06 NOTE — Progress Notes (Signed)
eLink Physician-Brief Progress Note Patient Name: Joseph [name] DOB: 05/01/1956 MRN: [id number]   Date of Service  11/06/2021  HPI/Events of Note  Fever 101.8. Levophed restarted at 1 mcg/min. Rcx 12/6 few candida  eICU Interventions  Given tylenol. Order [place] extremities x 4. Will defer to day team to order cultures vs line holiday        Caton Popowski Mechele Collin 11/06/2021, 7:59 PM

## 2021-11-06 NOTE — Progress Notes (Signed)
MD notified Dr. Delton Coombes about unstable hemodynamics throughout the day, onset of fever, hypotension/hypertension, and tachycardia.  Levophed ordered for hypotension.  Continue plan of care.  Erick Blinks, RN

## 2021-11-06 NOTE — Progress Notes (Signed)
ANTICOAGULATION CONSULT NOTE - Follow Up Consult  Pharmacy Consult for heparin Indication: atrial fibrillation  Labs: Recent Labs    11/04/21 0421 11/04/21 1622 11/05/21 0314 11/05/21 1649 11/06/21 0205  HGB 11.3*  --  10.6*  --  10.3*  HCT 34.4*  --  32.6*  --  31.6*  PLT 256  --  213  --  209  HEPARINUNFRC 0.33  --  0.38  --  0.21*  CREATININE 1.65*   < > 1.71*  1.68* 1.61* 1.51*   < > = values in this interval not displayed.    Assessment: 66yo male subtherapeutic on heparin after several levels at lower end of goal; no infusion issues or signs of bleeding charted.  Goal of Therapy:  Heparin level 0.3-0.7 units/ml   Plan:  Will increase heparin infusion by 2 units/kg/hr to 800 units/hr and check level in 8 hours.    Vernard Gambles, PharmD, BCPS  11/06/2021,3:32 AM

## 2021-11-06 NOTE — Progress Notes (Signed)
[facility] NEPHROLOGY PROGRESS NOTE  Assessment/ Plan:  #Acute kidney injury on CKD IIIa: likely ATN due to septic shock.  Follows with [facility] as an outpatient.  Started CRRT on 12/5 after refractory to Lasix challenge.  Volume status and electrolytes significantly improved.  His blood pressure improved and not requiring pressors.  I will discontinue CRRT today.  Continue strict ins and outs and daily lab to monitor renal recovery and assess dialysis need.  Discussed with ICU team.  #Acute hypoxic/hypercapnic respiratory failure/ARDS/influenza A: Currently on cefepime, vent per PCCM.  #Septic shock: Off of pressors now.  Blood pressure has improved.  Noted leukocytosis worsening, the catheter site is clean.    #Acute on chronic systolic heart failure due to ischemic cardiomyopathy: EF 35 to 40%.  Initially tried IV diuretics without response therefore volume optimized with CRRT.  Euvolemic now.  Cardiology is following.    #Hyponatremia, hypervolemic: Managed with CRRT.  Continue fluid restriction.  Discussed with ICU team.  Subjective: Seen and examined in ICU.  Off of pressors.  No urine output.  Tolerating CRRT well.  No new event. Objective Vital signs in last 24 hours: Vitals:   11/06/21 0758 11/06/21 0800 11/06/21 0830 11/06/21 0900  BP:  (!) 175/81  129/72  Pulse:  (!) 122 (!) 101 90  Resp:  '20 17 15  ' Temp: 97.7 F (36.5 C)     TempSrc: Oral     SpO2:  96% 96% 97%  Weight:      Height:       Weight change: -0.8 kg  Intake/Output Summary (Last 24 hours) at 11/06/2021 0959 Last data filed at 11/06/2021 0900 Gross per 24 hour  Intake 2450.71 ml  Output 3081 ml  Net -630.29 ml        Labs: Basic Metabolic Panel: Recent Labs  Lab 11/05/21 0314 11/05/21 1649 11/06/21 0205  NA 131*  130* 133* 131*  K 4.0  4.0 3.9 3.8  CL 98  98 99 99  CO2 '22  22 23 23  ' GLUCOSE 265*  261* 271* 195*  BUN 54*  56* 51* 50*  CREATININE 1.71*  1.68* 1.61* 1.51*   CALCIUM 8.2*  8.1* 8.4* 8.2*  PHOS 4.3 4.2 3.5    Liver Function Tests: Recent Labs  Lab 11/02/21 0300 11/02/21 1642 11/05/21 0314 11/05/21 1649 11/06/21 0205  AST 22  --   --   --   --   ALT 25  --   --   --   --   ALKPHOS 60  --   --   --   --   BILITOT 0.9  --   --   --   --   PROT 5.1*  --   --   --   --   ALBUMIN 1.9*   < > 2.3* 2.3* 2.2*   < > = values in this interval not displayed.    No results for input(s): LIPASE, AMYLASE in the last 168 hours. No results for input(s): AMMONIA in the last 168 hours. CBC: Recent Labs  Lab 11/01/21 0926 11/02/21 0300 11/02/21 1003 11/03/21 0231 11/04/21 0421 11/05/21 0314 11/06/21 0205  WBC 23.7* 23.1*  --  25.7* 35.3* 36.9* 49.4*  NEUTROABS 20.2*  --   --   --  29.3*  --   --   HGB 10.3* 10.0*  --  10.1* 11.3* 10.6* 10.3*  HCT 30.3* 30.1*  --  31.3* 34.4* 32.6* 31.6*  MCV 83.9 85.5  --  87.9 88.0 88.3 88.8  PLT 209 183   < > 202 256 213 209   < > = values in this interval not displayed.    Cardiac Enzymes: No results for input(s): CKTOTAL, CKMB, CKMBINDEX, TROPONINI in the last 168 hours. CBG: Recent Labs  Lab 11/05/21 1541 11/05/21 1916 11/05/21 2320 11/06/21 0334 11/06/21 0752  GLUCAP 234* 232* 163* 188* 120*     Iron Studies: No results for input(s): IRON, TIBC, TRANSFERRIN, FERRITIN in the last 72 hours. Studies/Results: DG CHEST PORT 1 VIEW  Result Date: 11/06/2021 CLINICAL DATA:  Respiratory distress. EXAM: PORTABLE CHEST 1 VIEW COMPARISON:  November 04, 2021. FINDINGS: The heart size and mediastinal contours are within normal limits. Endotracheal and nasogastric tubes are unchanged in position. Left-sided defibrillator is unchanged in position. Bilateral internal jugular catheters are unchanged. Stable bilateral upper lobe opacities are noted concerning for pneumonia, right greater than left. Minimal bilateral pleural effusions are noted. The visualized skeletal structures are unremarkable. IMPRESSION:  Stable support apparatus. Stable bilateral upper lobe opacities as described above. Electronically Signed   By: Marijo Conception M.D.   On: 11/06/2021 05:27    Medications: Infusions:   prismasol BGK 4/2.5 500 mL/hr at 11/06/21 0222   sodium chloride 10 mL/hr at 11/06/21 0900   amiodarone 30 mg/hr (11/06/21 0900)   ceFEPime (MAXIPIME) IV Stopped (11/06/21 0430)   feeding supplement (VITAL 1.5 CAL) 1,000 mL (11/06/21 0000)   fentaNYL infusion INTRAVENOUS 150 mcg/hr (11/06/21 0900)   heparin 800 Units/hr (11/06/21 0900)   prismasol BGK 2/2.5 dialysis solution 1,500 mL/hr at 11/06/21 0650   prismasol BGK 2/2.5 replacement solution 300 mL/hr at 11/05/21 2313   promethazine (PHENERGAN) injection (IM or IVPB)     propofol (DIPRIVAN) infusion 15 mcg/kg/min (11/06/21 0900)    Scheduled Medications:  arformoterol  15 mcg Nebulization BID   vitamin C  250 mg Per Tube BID   aspirin  81 mg Per Tube Daily   atorvastatin  80 mg Per Tube QPM   B-complex with vitamin C  1 tablet Per Tube Daily   budesonide (PULMICORT) nebulizer solution  0.5 mg Nebulization BID   chlorhexidine gluconate (MEDLINE KIT)  15 mL Mouth Rinse BID   Chlorhexidine Gluconate Cloth  6 each Topical Daily   feeding supplement (PROSource TF)  90 mL Per Tube QID   insulin aspart  0-20 Units Subcutaneous Q4H   insulin aspart  10 Units Subcutaneous Q4H   insulin glargine-yfgn  35 Units Subcutaneous BID   mouth rinse  15 mL Mouth Rinse 10 times per day   midodrine  10 mg Per Tube Q8H   pantoprazole sodium  40 mg Per Tube Daily   revefenacin  175 mcg Nebulization Daily   sodium chloride flush  10-40 mL Intracatheter Q12H    have reviewed scheduled and prn medications.  Physical Exam: General: Critically ill looking male, intubated, on vent, not in distress Heart:RRR, s1s2 nl Lungs: Coarse breath sound bilateral Abdomen:soft,  non-distended Extremities:No edema Dialysis Access: Temporary HD catheter in place.  The site looks  clean. Marin Milley Prasad Nava Song 11/06/2021,9:59 AM  LOS: 13 days

## 2021-11-06 NOTE — Progress Notes (Signed)
NAME:  Joseph [name], MRN:  [id number], DOB:  [birth year], LOS: 13 ADMISSION DATE:  11/22/2021, CONSULTATION DATE:  10/28/2021 REFERRING MD:  Dr. Arbutus Leas, CHIEF COMPLAINT:  Acute hypoxic respiratory failure    History of Present Illness:  Joseph [name] is a 65 y.o. male with a PMH significant for CHF, COPD, CAD s/p CABG, HTN, HLD, MRSA bacteremia, and prior GI bleed who presented to the ED for acute respiratory distress.   On ED arrival patient was placed on BIPAP for significant hypoxia. Workup revealed patient was Flu A positive with underlying COPD exacerbation and likely CHF exacerbation with component of pulmonary edema as well. He was treated with IV diuretics, Tamaflu, CAP coverage, Amiodarone drip with beta blocker for persistent A-fib, and steroids with limited improvement in respiratory status leading to the decision to intubated and transfer to [facility] for further pulmonary and cardiac care   Pertinent  Medical History  CHF, COPD, CAD s/p CABG, HTN, HLD, MRSA bacteremia, and prior GI bleed  Significant Hospital Events: Including procedures, antibiotic start and stop dates in addition to other pertinent events   11/28 admitted at [facility] for acute hypoxic respiratory failure  12/2 intubated and transferred [facility] care   10/31/2021 CVVHD initiated; cefepime started 12/7: off vaso; remains on levo 12/8: restart vaso  Interim History / Subjective:   Pressors off on 12/10 Fentanyl 150, propofol 15 -7.9 L total CVVH running  Objective   Blood pressure 97/65, pulse 77, temperature (!) 96.8 F (36 C), temperature source Axillary, resp. rate 15, height 5\' 11"  (1.803 m), weight 72.6 kg, SpO2 98 %. CVP:  [5 mmHg-11 mmHg] 8 mmHg  Vent Mode: PRVC FiO2 (%):  [30 %-40 %] 30 % Set Rate:  [15 bmp] 15 bmp Vt Set:  [600 mL] 600 mL PEEP:  [5 cmH20] 5 cmH20 Plateau Pressure:  [15 cmH20-17 cmH20] 15 cmH20   Intake/Output Summary (Last 24 hours) at 11/06/2021 0719 Last data filed at  11/06/2021 0700 Gross per 24 hour  Intake 2471.01 ml  Output 3229 ml  Net -757.99 ml   Filed Weights   11/04/21 0500 11/05/21 0103 11/06/21 0106  Weight: 76.6 kg 73.4 kg 72.6 kg    Examination:  General: Chronically ill-appearing man, ventilated HEENT: ET tube in place, poor dentition.  Eyes were rolled back in his head with a disconjugate gaze Neuro: Sedated.  No response to voice or stimulation on propofol and fentanyl.  He does have an intact cough and gag CV: Distant, regular, tachycardic, no murmur PULM: Very distant, no crackles or wheezes GI: Nondistended, hypoactive bowel sounds Extremities: No edema Skin: Poor perfusion, lower extremities cool, no rash    Resolved Hospital Problem list     Assessment & Plan:  Acute Hypoxic and Hypercapnic Respiratory Failure with moderate ARDS  Flu A positive : received tamiflu Acute COPD exacerbation  Some volume overload still to be dealt with but I think he does have ARDS in setting of flu, possible superimposed HAP.  P: -Continue PRVC 8 cc/kg -Would like to begin to push for PSV/SBT but he is oversedated.  Were going to work on lightening sedation as able -Complete cefepime 12/11 (day 10 of 10) -Brovana, Pulmicort, Yupelri -Tolerating volume removal with CVVHD, -8.1 L total  Shock: multifactorial, improved 12/10 now intermittent hypertension Leukocytosis Hypothermic: began when started on CVVHD  P: -DC stress dose steroids on 12/11 -Completes antibiotics as above -Believe he is reached euvolemia.  Plan to keep fluid balance  even if he stays on CVVHD -Continue midodrine  Hypertension.  Has been labile particularly with degree of sedation -Add labetalol and hydralazine as needed on 12/11.   -If blood pressure stable/elevated then will add maintenance BP regimen on 12/12.  AKI, oliguric: CVVHD started 12/5 -Baseline creatinine 1.4-1.6 with admission creatinine 2.01 P: -Has tolerated CVVHD and volume removal.   Appreciate nephrology management -Plan a break from CVVHD on 12/11.  Follow renal function.  Unclear whether he will have a renal recovery, may need to start intermittent HD in the coming days -Replete electrolytes as indicated   Hx of HFrEF with ischemic cardiomyopathy  -EF 35-40% Persistent A-Flutter/A-fib with RVR -CHA2DS2-VASc score 4 Elevated troponin with hx of CAD -s/p CABG 2000 P: -Heparin and amiodarone.  Question whether we can transition the amiodarone to per tube -CVP and volume status at goal -Can probably decrease frequency of Co. oximetry -Appreciate cardiology assistance  Type 2 diabetes  -Home medication included Jardiance, Hgb A1C 5.6 P: -Continue resistant SSI -Increase TF coverage 10 units q4 -Continue semglee 30 units BID -CBG monitoring  Stage 1 coccyx pressure injury P: -Pressure alleviating devices  -continue to assess   Best Practice (right click and "Reselect all SmartList Selections" daily)   Diet/type: tubefeeds DVT prophylaxis: systemic heparin GI prophylaxis: PPI Lines: Central line and Dialysis Catheter Foley:  Yes, and it is still needed Code Status:  full code Last date of multidisciplinary goals of care discussion: 12/8 spoke with daughter at bedside and updated on plan of care    Critical care time: 25 minutes    Baltazar Apo, MD, PhD 11/06/2021, 7:19 AM [facility] [phone] or if no answer before 7:00PM call [phone] For any issues after 7:00PM please call eLink [phone]

## 2021-11-06 NOTE — Progress Notes (Signed)
ANTICOAGULATION CONSULT NOTE  Pharmacy Consult for Heparin Indication: atrial fibrillation   Allergies  Allergen Reactions   Entresto [Sacubitril-Valsartan]     Worsening renal function and weight gain    Patient Measurements: Height: 5\' 11"  (180.3 cm) Weight: 72.6 kg (160 lb 0.9 oz) IBW/kg (Calculated) : 75.3 Heparin Dosing Weight: 80.3 kg   Vital Signs: Temp: 97.6 F (36.4 C) (12/11 1154) Temp Source: Oral (12/11 1154) BP: 109/60 (12/11 1300) Pulse Rate: 78 (12/11 1300)  Labs: Recent Labs    11/04/21 0421 11/04/21 1622 11/05/21 0314 11/05/21 1649 11/06/21 0205 11/06/21 1225  HGB 11.3*  --  10.6*  --  10.3*  --   HCT 34.4*  --  32.6*  --  31.6*  --   PLT 256  --  213  --  209  --   HEPARINUNFRC 0.33  --  0.38  --  0.21* 0.44  CREATININE 1.65*   < > 1.71*  1.68* 1.61* 1.51*  --    < > = values in this interval not displayed.     Estimated Creatinine Clearance: 48.7 mL/min (A) (by C-G formula based on SCr of 1.51 mg/dL (H)).  Assessment: 65 y.o. male with Afib to continue on IV heparin.  Heparin level therapeutic at 0.44. CBC stable.  Goal of Therapy:  Heparin level 0.3-0.7 units/ml Monitor platelets by anticoagulation protocol: Yes   Plan:  Continue heparin gtt at 800 units/hr Daily heparin level and CBC  79, PharmD, [facility] Clinical Pharmacist Please see AMION for all Pharmacists' Contact Phone Numbers 11/06/2021, 1:26 PM

## 2021-11-07 ENCOUNTER — Inpatient Hospital Stay (HOSPITAL_COMMUNITY): Payer: Medicare HMO

## 2021-11-07 DIAGNOSIS — J9602 Acute respiratory failure with hypercapnia: Secondary | ICD-10-CM | POA: Diagnosis not present

## 2021-11-07 DIAGNOSIS — R509 Fever, unspecified: Secondary | ICD-10-CM

## 2021-11-07 DIAGNOSIS — J9601 Acute respiratory failure with hypoxia: Secondary | ICD-10-CM | POA: Diagnosis not present

## 2021-11-07 LAB — RENAL FUNCTION PANEL
Albumin: 1.9 g/dL — ABNORMAL LOW (ref 3.5–5.0)
Anion gap: 15 (ref 5–15)
BUN: 117 mg/dL — ABNORMAL HIGH (ref 8–23)
CO2: 18 mmol/L — ABNORMAL LOW (ref 22–32)
Calcium: 8.3 mg/dL — ABNORMAL LOW (ref 8.9–10.3)
Chloride: 98 mmol/L (ref 98–111)
Creatinine, Ser: 3.54 mg/dL — ABNORMAL HIGH (ref 0.61–1.24)
GFR, Estimated: 18 mL/min — ABNORMAL LOW (ref >60)
Glucose, Bld: 180 mg/dL — ABNORMAL HIGH (ref 70–99)
Phosphorus: 3.4 mg/dL (ref 2.5–4.6)
Potassium: 4.1 mmol/L (ref 3.5–5.1)
Sodium: 131 mmol/L — ABNORMAL LOW (ref 135–145)

## 2021-11-07 LAB — CBC
HCT: 27.4 % — ABNORMAL LOW (ref 39.0–52.0)
Hemoglobin: 9.3 g/dL — ABNORMAL LOW (ref 13.0–17.0)
MCH: 29.7 pg (ref 26.0–34.0)
MCHC: 33.9 g/dL (ref 30.0–36.0)
MCV: 87.5 fL (ref 80.0–100.0)
Platelets: 194 10*3/uL (ref 150–400)
RBC: 3.13 MIL/uL — ABNORMAL LOW (ref 4.22–5.81)
RDW: 14.9 % (ref 11.5–15.5)
WBC: 49.9 10*3/uL — ABNORMAL HIGH (ref 4.0–10.5)
nRBC: 2.4 % — ABNORMAL HIGH (ref 0.0–0.2)

## 2021-11-07 LAB — GLUCOSE, CAPILLARY
Glucose-Capillary: 134 mg/dL — ABNORMAL HIGH (ref 70–99)
Glucose-Capillary: 159 mg/dL — ABNORMAL HIGH (ref 70–99)
Glucose-Capillary: 173 mg/dL — ABNORMAL HIGH (ref 70–99)
Glucose-Capillary: 175 mg/dL — ABNORMAL HIGH (ref 70–99)
Glucose-Capillary: 181 mg/dL — ABNORMAL HIGH (ref 70–99)
Glucose-Capillary: 198 mg/dL — ABNORMAL HIGH (ref 70–99)

## 2021-11-07 LAB — HEPARIN LEVEL (UNFRACTIONATED)
Heparin Unfractionated: 0.26 IU/mL — ABNORMAL LOW (ref 0.30–0.70)
Heparin Unfractionated: 0.29 IU/mL — ABNORMAL LOW (ref 0.30–0.70)
Heparin Unfractionated: 0.52 IU/mL (ref 0.30–0.70)

## 2021-11-07 LAB — MAGNESIUM: Magnesium: 2.9 mg/dL — ABNORMAL HIGH (ref 1.7–2.4)

## 2021-11-07 MED ORDER — AMIODARONE HCL 200 MG PO TABS: 200.0000 mg | ORAL_TABLET | Freq: Every day | ORAL | Status: DC

## 2021-11-07 MED ORDER — AMIODARONE HCL 200 MG PO TABS
200.0000 mg | ORAL_TABLET | Freq: Two times a day (BID) | ORAL | Status: DC
  Administered 2021-11-07 – 2021-11-09 (×5): 200 mg
  Filled 2021-11-07 (×5): qty 1

## 2021-11-07 NOTE — Progress Notes (Signed)
ANTICOAGULATION CONSULT NOTE  Pharmacy Consult for Heparin Indication: atrial fibrillation   Allergies  Allergen Reactions   Entresto [Sacubitril-Valsartan]     Worsening renal function and weight gain    Patient Measurements: Height: 5\' 11"  (180.3 cm) Weight: 72.8 kg (160 lb 7.9 oz) IBW/kg (Calculated) : 75.3 Heparin Dosing Weight: 80.3 kg   Vital Signs: Temp: 100.9 F (38.3 C) (12/12 1537) Temp Source: Axillary (12/12 1537) BP: 145/63 (12/12 1900) Pulse Rate: 106 (12/12 1944)  Labs: Recent Labs    11/05/21 0314 11/05/21 1649 11/06/21 0205 11/06/21 1225 11/07/21 0256 11/07/21 1019 11/07/21 1933  HGB 10.6*  --  10.3*  --  9.3*  --   --   HCT 32.6*  --  31.6*  --  27.4*  --   --   PLT 213  --  209  --  194  --   --   HEPARINUNFRC 0.38  --  0.21*   < > 0.26* 0.29* 0.52  CREATININE 1.71*  1.68* 1.61* 1.51*  --  3.54*  --   --    < > = values in this interval not displayed.     Estimated Creatinine Clearance: 20.9 mL/min (A) (by C-G formula based on SCr of 3.54 mg/dL (H)).  Assessment: 65 y.o. male with Afib to continue on IV heparin.  Heparin level now therapeutic  Goal of Therapy:  Heparin level 0.3-0.7 units/ml Monitor platelets by anticoagulation protocol: Yes   Plan:  Continue heparin at 1000 units / hr Daily heparin level and CBC Monitor closely for bleeding  Thank you. 79, PharmD 11/07/2021, 8:34 PM

## 2021-11-07 NOTE — Progress Notes (Signed)
[facility] NEPHROLOGY PROGRESS NOTE  Assessment/ Plan:  #Acute kidney injury on CKD IIIa: Likely 2/2 ATN due to septic shock.  Follows with [facility] as an outpatient.  Started CRRT on 12/5 after refractory to Lasix challenge.  Volume status and electrolytes significantly improved.  His blood pressure improved with midodrine and only briefly required levophed ON.  Given patient remains anuric and had doubling of his BUN/sCR since the day prior and requirement of levophed though briefly will likely resume CRRT this evening. Continue strict ins and outs and daily lab to monitor renal recovery.    #Acute hypoxic/hypercapnic respiratory failure/ARDS/influenza A: Currently on cefepime, vent per PCCM.  #Septic shock: Off of pressors now.  Blood pressure elevated.  Marked leukocytosis worsening, there is no evidence of infection at the site of insertion of his catheter.   #Acute on chronic systolic heart failure due to ischemic cardiomyopathy: EF 35 to 40%.  Initially trialed on IV diuretics without response therefore volume optimized with CRRT.  Euvolemic now.  Cardiology is following.    #Hyponatremia, hypervolemic: Managed with CRRT.  Continue fluid restriction.   Subjective: Seen and examined in ICU.  Off of pressors. Briefly required levophed ON.  No urine output.  Hypertensive ON.  Objective Vital signs in last 24 hours: Vitals:   11/07/21 0845 11/07/21 0900 11/07/21 0915 11/07/21 1000  BP: (!) 151/70 (!) 144/76 (!) 158/69 (!) 152/69  Pulse: (!) 109 (!) 101 (!) 102 (!) 105  Resp: (!) 21 (!) 23 (!) 21 (!) 25  Temp:    100.2 F (37.9 C)  TempSrc:    Axillary  SpO2: 95% (!) 86% 96% 96%  Weight:      Height:       Weight change: 0.2 kg  Intake/Output Summary (Last 24 hours) at 11/07/2021 1043 Last data filed at 11/07/2021 1000 Gross per 24 hour  Intake 2439.53 ml  Output 122 ml  Net 2317.53 ml        Labs: Basic Metabolic Panel: Recent Labs  Lab 11/05/21 1649  11/06/21 0205 11/07/21 0256  NA 133* 131* 131*  K 3.9 3.8 4.1  CL 99 99 98  CO2 23 23 18*  GLUCOSE 271* 195* 180*  BUN 51* 50* 117*  CREATININE 1.61* 1.51* 3.54*  CALCIUM 8.4* 8.2* 8.3*  PHOS 4.2 3.5 3.4    Liver Function Tests: Recent Labs  Lab 11/02/21 0300 11/02/21 1642 11/05/21 1649 11/06/21 0205 11/07/21 0256  AST 22  --   --   --   --   ALT 25  --   --   --   --   ALKPHOS 60  --   --   --   --   BILITOT 0.9  --   --   --   --   PROT 5.1*  --   --   --   --   ALBUMIN 1.9*   < > 2.3* 2.2* 1.9*   < > = values in this interval not displayed.    No results for input(s): LIPASE, AMYLASE in the last 168 hours. No results for input(s): AMMONIA in the last 168 hours. CBC: Recent Labs  Lab 11/01/21 0926 11/02/21 0300 11/03/21 0231 11/04/21 0421 11/05/21 0314 11/06/21 0205 11/07/21 0256  WBC 23.7*   < > 25.7* 35.3* 36.9* 49.4* 49.9*  NEUTROABS 20.2*  --   --  29.3*  --   --   --   HGB 10.3*   < > 10.1* 11.3* 10.6*  10.3* 9.3*  HCT 30.3*   < > 31.3* 34.4* 32.6* 31.6* 27.4*  MCV 83.9   < > 87.9 88.0 88.3 88.8 87.5  PLT 209   < > 202 256 213 209 194   < > = values in this interval not displayed.    Cardiac Enzymes: No results for input(s): CKTOTAL, CKMB, CKMBINDEX, TROPONINI in the last 168 hours. CBG: Recent Labs  Lab 11/06/21 1548 11/06/21 1913 11/06/21 2346 11/07/21 0301 11/07/21 0758  GLUCAP 196* 175* 200* 181* 175*     Iron Studies: No results for input(s): IRON, TIBC, TRANSFERRIN, FERRITIN in the last 72 hours. Studies/Results: DG CHEST PORT 1 VIEW  Result Date: 11/06/2021 CLINICAL DATA:  Respiratory distress. EXAM: PORTABLE CHEST 1 VIEW COMPARISON:  November 04, 2021. FINDINGS: The heart size and mediastinal contours are within normal limits. Endotracheal and nasogastric tubes are unchanged in position. Left-sided defibrillator is unchanged in position. Bilateral internal jugular catheters are unchanged. Stable bilateral upper lobe opacities are  noted concerning for pneumonia, right greater than left. Minimal bilateral pleural effusions are noted. The visualized skeletal structures are unremarkable. IMPRESSION: Stable support apparatus. Stable bilateral upper lobe opacities as described above. Electronically Signed   By: Marijo Conception M.D.   On: 11/06/2021 05:27    Medications: Infusions:  sodium chloride Stopped (11/07/21 0835)   dexmedetomidine (PRECEDEX) IV infusion 0.5 mcg/kg/hr (11/07/21 1000)   feeding supplement (VITAL 1.5 CAL) 1,000 mL (11/06/21 2100)   fentaNYL infusion INTRAVENOUS 50 mcg/hr (11/07/21 1000)   heparin 900 Units/hr (11/07/21 1000)   norepinephrine (LEVOPHED) Adult infusion Stopped (11/07/21 0302)   promethazine (PHENERGAN) injection (IM or IVPB)      Scheduled Medications:  amiodarone  200 mg Per Tube BID   arformoterol  15 mcg Nebulization BID   vitamin C  250 mg Per Tube BID   aspirin  81 mg Per Tube Daily   atorvastatin  80 mg Per Tube QPM   B-complex with vitamin C  1 tablet Per Tube Daily   budesonide (PULMICORT) nebulizer solution  0.5 mg Nebulization BID   chlorhexidine gluconate (MEDLINE KIT)  15 mL Mouth Rinse BID   Chlorhexidine Gluconate Cloth  6 each Topical Daily   feeding supplement (PROSource TF)  90 mL Per Tube QID   insulin aspart  0-20 Units Subcutaneous Q4H   insulin aspart  10 Units Subcutaneous Q4H   insulin glargine-yfgn  35 Units Subcutaneous BID   mouth rinse  15 mL Mouth Rinse 10 times per day   midodrine  10 mg Per Tube Q8H   pantoprazole sodium  40 mg Per Tube Daily   revefenacin  175 mcg Nebulization Daily   sodium chloride flush  10-40 mL Intracatheter Q12H    have reviewed scheduled and prn medications.  Physical Exam: General: Critically ill looking male, sedated and intubated, NAD Heart: Tachycardic  Lungs: Coarse breath sounds bilaterally Abdomen:soft,  non-distended Extremities:No edema of BLE  Dialysis Access: Temporary HD catheter in place.  Insertion site  clean w/ no evidence of pus  Shellee Streng 11/07/2021,10:43 AM  LOS: 14 days

## 2021-11-07 NOTE — Progress Notes (Signed)
Upper extremity venous has been completed.   Preliminary results in CV Proc.   Aundra Millet Leisel Pinette 11/07/2021 11:55 AM

## 2021-11-07 NOTE — Progress Notes (Addendum)
Advanced Heart Failure Rounding Note  PCP-Cardiologist: Minus Breeding, MD   Subjective:    Remains intubated and sedated. FiO2 30%  Off pressors.   Continues on abx + steroids. PCT 5.05>>2.99>>2.56   CVVHD held Remains anuric.  Febrile. WBC 50  Objective:   Weight Range: 72.8 kg Body mass index is 22.38 kg/m.   Vital Signs:   Temp:  [97.6 F (36.4 C)-101.8 F (38.8 C)] 100.1 F (37.8 C) (12/12 0800) Pulse Rate:  [78-129] 112 (12/12 0730) Resp:  [11-27] 25 (12/12 0730) BP: (109-193)/(53-87) 154/82 (12/12 0700) SpO2:  [86 %-100 %] 97 % (12/12 0755) Arterial Line BP: (86-225)/(31-82) 144/50 (12/12 0730) FiO2 (%):  [30 %] 30 % (12/12 0750) Weight:  [72.8 kg] 72.8 kg (12/12 0253) Last BM Date: 11/06/21  Weight change: Filed Weights   11/05/21 0103 11/06/21 0106 11/07/21 0253  Weight: 73.4 kg 72.6 kg 72.8 kg    Intake/Output:   Intake/Output Summary (Last 24 hours) at 11/07/2021 0810 Last data filed at 11/07/2021 0700 Gross per 24 hour  Intake 2419.32 ml  Output 378 ml  Net 2041.32 ml      Physical Exam   General:  IIll-appearing. Sedated on vent  HEENT: normal + ETT Neck: supple. RIJ HD cath  Carotids 2+ bilat; no bruits. No lymphadenopathy or thryomegaly appreciated. Cor: PMI nondisplaced. Regular tachy Lungs: coarse Abdomen: soft, nontender, nondistended. No hepatosplenomegaly. No bruits or masses. Good bowel sounds. Extremities: no cyanosis, clubbing, rash, edema Neuro: intubated sedated   Telemetry   Sinus tach 100-120 Personally reviewed   Labs    CBC Recent Labs    11/06/21 0205 11/07/21 0256  WBC 49.4* 49.9*  HGB 10.3* 9.3*  HCT 31.6* 27.4*  MCV 88.8 87.5  PLT 209 837   Basic Metabolic Panel Recent Labs    11/06/21 0205 11/07/21 0256  NA 131* 131*  K 3.8 4.1  CL 99 98  CO2 23 18*  GLUCOSE 195* 180*  BUN 50* 117*  CREATININE 1.51* 3.54*  CALCIUM 8.2* 8.3*  MG 2.9* 2.9*  PHOS 3.5 3.4   Liver Function  Tests Recent Labs    11/06/21 0205 11/07/21 0256  ALBUMIN 2.2* 1.9*   No results for input(s): LIPASE, AMYLASE in the last 72 hours. Cardiac Enzymes No results for input(s): CKTOTAL, CKMB, CKMBINDEX, TROPONINI in the last 72 hours.  BNP: BNP (last 3 results) Recent Labs    10/25/21 1945 10/28/21 1746 11/02/21 0300  BNP 383.0* 377.2* 129.5*    ProBNP (last 3 results) No results for input(s): PROBNP in the last 8760 hours.   D-Dimer No results for input(s): DDIMER in the last 72 hours.  Hemoglobin A1C No results for input(s): HGBA1C in the last 72 hours.  Fasting Lipid Panel Recent Labs    11/05/21 0314  TRIG 149   Thyroid Function Tests No results for input(s): TSH, T4TOTAL, T3FREE, THYROIDAB in the last 72 hours.  Invalid input(s): FREET3  Other results:   Imaging    No results found.   Medications:     Scheduled Medications:  arformoterol  15 mcg Nebulization BID   vitamin C  250 mg Per Tube BID   aspirin  81 mg Per Tube Daily   atorvastatin  80 mg Per Tube QPM   B-complex with vitamin C  1 tablet Per Tube Daily   budesonide (PULMICORT) nebulizer solution  0.5 mg Nebulization BID   chlorhexidine gluconate (MEDLINE KIT)  15 mL Mouth Rinse BID   Chlorhexidine  Gluconate Cloth  6 each Topical Daily   feeding supplement (PROSource TF)  90 mL Per Tube QID   insulin aspart  0-20 Units Subcutaneous Q4H   insulin aspart  10 Units Subcutaneous Q4H   insulin glargine-yfgn  35 Units Subcutaneous BID   mouth rinse  15 mL Mouth Rinse 10 times per day   midodrine  10 mg Per Tube Q8H   pantoprazole sodium  40 mg Per Tube Daily   revefenacin  175 mcg Nebulization Daily   sodium chloride flush  10-40 mL Intracatheter Q12H    Infusions:  sodium chloride 10 mL/hr at 11/07/21 0700   amiodarone 30 mg/hr (11/07/21 0700)   dexmedetomidine (PRECEDEX) IV infusion 0.6 mcg/kg/hr (11/07/21 0700)   feeding supplement (VITAL 1.5 CAL) 1,000 mL (11/06/21 2100)    fentaNYL infusion INTRAVENOUS 125 mcg/hr (11/07/21 0700)   heparin 900 Units/hr (11/07/21 0700)   norepinephrine (LEVOPHED) Adult infusion Stopped (11/07/21 0302)   promethazine (PHENERGAN) injection (IM or IVPB)      PRN Medications: acetaminophen **OR** acetaminophen, albuterol, fentaNYL, heparin, hydrALAZINE, labetalol, polyethylene glycol, polyvinyl alcohol, promethazine (PHENERGAN) injection (IM or IVPB), senna-docusate, sodium chloride flush    Patient Profile   65 y/o male w/ CAD, chronic systolic heart failure and CKD IIIb, admitted w/ Influenza A, sepsis PNA, acute hypoxic respiratory failure and AKI on CKD.   Assessment/Plan   1. Septic/distributive shock -  Remains febrile Tm 101. WBC 50 -  Completed cefepime.  -  CCM managing. Plan for removing lines today  - Off pressors   2. Acute hypoxic respiratory failure - has PNA/ARDS with diffuse lung injury on CXR - continue vent support per CCM - developed pulmonary edema post volume resuscitation>>CVVHD for volume removal.  - CXR improving. Vent down to 30%. Will try to wean but may need trach. D/w Dr. Halford Chessman    3. Acute on chronic systolic HF due to iCM - EF stable 35-40% on echo - Volume overloaded in setting of fluid resuscitation for sepsis - poor response to high dose IV diuretics in setting of AKI 2/2 ATN  - Now s/p CVVHD for volume removal will likely need iHD vs reinitiation of CVVHD in next day or two,  - Off pressors.  - Midodrine 10 TID - can wean as tolerated - Off GDMT due to shock/AKI   4. PAF with RVR - SR 12/07, back in NSR today - CCM changed to po. Will usee 200 bid - Can restart gtt as needed if AF with RVR recurs   - Continue heparin gtt. Hgb stable.  5. NSVT - Runs up to 15 beats noted - On amio gtt at 30/hr - Keep K >4, mag >2   6. AKI on CKD 3b due to ATN/shock - baseline Scr 1.5-2.0. Peaked to 5.86 - renal u/s with medico-renal disease in R>L kidney - Now anuric. CVVHD initiated 12/5.  Possible switch to iHD soon  - Now on midodrine 10 TID - Keep MAP > 70    7. CAD with elevated trop due to demand ischemia - continue ASA/statin - doubt ischemia is a major issue here  - no b-blocker with shock  D/w his daughter at bedside.   CRITICAL CARE Performed by: Glori Bickers  Total critical care time: 35 minutes  Critical care time was exclusive of separately billable procedures and treating other patients.  Critical care was necessary to treat or prevent imminent or life-threatening deterioration.  Critical care was time spent personally by me (independent  of midlevel providers or residents) on the following activities: development of treatment plan with patient and/or surrogate as well as nursing, discussions with consultants, evaluation of patient's response to treatment, examination of patient, obtaining history from patient or surrogate, ordering and performing treatments and interventions, ordering and review of laboratory studies, ordering and review of radiographic studies, pulse oximetry and re-evaluation of patient's condition.  Length of Stay: 14  Glori Bickers, MD  9:03 AM   Advanced Heart Failure Team Pager [phone] (M-F; 7a - 5p)  Please contact [facility] for night-coverage after hours (5p -7a ) and weekends on [email or web address]

## 2021-11-07 NOTE — Progress Notes (Signed)
NAME:  Joseph [name], MRN:  [id number], DOB:  18-Aug-1956, LOS: 68 ADMISSION DATE:  09/27/2021, CONSULTATION DATE:  10/28/2021 REFERRING MD:  Dr. Carles Collet, CHIEF COMPLAINT:  Acute hypoxic respiratory failure    History of Present Illness:  65 yo male presented to [facility] ER with respiratory distress and hypoxia.  Found to have COPD exacerbation from Influenza A pneumonia and acute on chronic systolic CHF with acute pulmonary edema.  Started on amiodarone for a fib with RVR.  Failed Bipap and required intubation.  Pertinent  Medical History  CHF, COPD, CAD s/p CABG, HTN, HLD, MRSA bacteremia, and prior GI bleed  Significant Hospital Events: Including procedures, antibiotic start and stop dates in addition to other pertinent events   11/28 admitted at [facility] for acute hypoxic respiratory failure  12/2 intubated and transferred [facility] care  10/31/2021 CVVHD initiated; cefepime started 12/7 off vaso; remains on levo 12/8 restart vaso 12/10 off CRRT 12/11 off pressors; Tm 101.2, increased WBC  Interim History / Subjective:  Transiently on pressors overnight >> off this AM.  Tm 101.2.  tolerated PS 10/5.  Remains on precedex.  Objective   Blood pressure (!) 154/82, pulse (!) 112, temperature 100.1 F (37.8 C), temperature source Axillary, resp. rate (!) 25, height 5\' 11"  (1.803 m), weight 72.8 kg, SpO2 97 %. CVP:  [7 mmHg-9 mmHg] 7 mmHg  Vent Mode: PRVC FiO2 (%):  [30 %] 30 % Set Rate:  [15 bmp] 15 bmp Vt Set:  [600 mL] 600 mL PEEP:  [5 cmH20] 5 cmH20 Plateau Pressure:  [13 cmH20-15 cmH20] 13 cmH20   Intake/Output Summary (Last 24 hours) at 11/07/2021 0804 Last data filed at 11/07/2021 0700 Gross per 24 hour  Intake 2419.32 ml  Output 378 ml  Net 2041.32 ml   Filed Weights   11/05/21 0103 11/06/21 0106 11/07/21 0253  Weight: 73.4 kg 72.6 kg 72.8 kg    Examination:  General - sedated Eyes - pupils reactive ENT - ETT in place Cardiac - irregular, tachycardic Chest - b/l  rhonchi Abdomen - soft, non tender, + bowel sounds Extremities - no edema Skin - no rashes Neuro - RASS -2   Resolved Hospital Problem list   Elevated troponin from demand ischemia  Assessment & Plan:   Acute hypoxic/hypercapnic respiratory failure from Influenza A pneumonia with ARDS, acute pulmonary edema and COPD exacerbation. - ETT 12/02 >>  - pressure support wean as tolerated - f/u CXR - goal SpO2 > 90% - continue brovana, yupelri, pulmicort - prn albuterol  New fever 12/11 with leukocytosis. - check procalcitonin - repeat sputum and blood culture, UA if fever persists after lines  - d/c central line, arterial line - completed 14 days of Abx on 12/11 >> hold restarting ABx for now - f/u doppler of arms and legs  Hypotension. - intermittently still needing pressors - continue amiodarone - goal even fluid balance  A fib with RVR. Acute on chronic systolic CHF. Hx of HTN, HLD, CAD. - transition to amiodarone 200 mg bid per tube - continue heparin gtt for now - hold outpt toprol, demadex, enalapril, aldactone - continue ASA, lipitor - heart failure team following  AKI from ATN 2nd to septic shock, hypoxia. CKD 3a. - off CRRT 12/11 - nephrology following - f/u BMET, monitor urine outpt  DM type 2 poorly controlled with hyperglycemia. - SSI - hold outpt jardiance  Acute metabolic encephalopathy. - RASS goal 0 to -1  Pressure injury. - Coccyx stage  1, not present on admission  D/w Dr. Gala Romney   Best Practice (right click and "Reselect all SmartList Selections" daily)   Diet/type: tubefeeds DVT prophylaxis: systemic heparin GI prophylaxis: protonix Lines: d/c central line and arterial line 12/12 Foley:  Yes, and it is still needed Code Status:  full code Family: updated Pt's daughter at bedside  Labs:   CMP Latest Ref Rng & Units 11/07/2021 11/06/2021 11/05/2021  Glucose 70 - 99 mg/dL 892(J) 194(R) 740(C)  BUN 8 - 23 mg/dL 144(Y) 18(H) 63(J)   Creatinine 0.61 - 1.24 mg/dL 4.97(W) 2.63(Z) 8.58(I)  Sodium 135 - 145 mmol/L 131(L) 131(L) 133(L)  Potassium 3.5 - 5.1 mmol/L 4.1 3.8 3.9  Chloride 98 - 111 mmol/L 98 99 99  CO2 22 - 32 mmol/L 18(L) 23 23  Calcium 8.9 - 10.3 mg/dL 8.3(L) 8.2(L) 8.4(L)  Total Protein 6.5 - 8.1 g/dL - - -  Total Bilirubin 0.3 - 1.2 mg/dL - - -  Alkaline Phos 38 - 126 U/L - - -  AST 15 - 41 U/L - - -  ALT 0 - 44 U/L - - -    CBC Latest Ref Rng & Units 11/07/2021 11/06/2021 11/05/2021  WBC 4.0 - 10.5 K/uL 49.9(H) 49.4(H) 36.9(H)  Hemoglobin 13.0 - 17.0 g/dL 5.0(Y) 10.3(L) 10.6(L)  Hematocrit 39.0 - 52.0 % 27.4(L) 31.6(L) 32.6(L)  Platelets 150 - 400 K/uL 194 209 213    ABG    Component Value Date/Time   PHART 7.265 (L) 10/30/2021 0203   PCO2ART 40.8 10/30/2021 0203   PO2ART 80 (L) 10/30/2021 0203   HCO3 18.5 (L) 10/30/2021 0203   TCO2 20 (L) 10/30/2021 0203   ACIDBASEDEF 8.0 (H) 10/30/2021 0203   O2SAT 95.2 11/06/2021 0205    CBG (last 3)  Recent Labs    11/06/21 2346 11/07/21 0301 11/07/21 0758  GLUCAP 200* 181* 175*    Critical care time: 39 minutes  Coralyn Helling, MD [facility] Pager - [phone] 11/07/2021, 8:21 AM

## 2021-11-07 NOTE — Progress Notes (Signed)
Lower extremity venous has been completed.   Preliminary results in CV Proc.   Aundra Millet Maximino Cozzolino 11/07/2021 11:50 AM

## 2021-11-07 NOTE — Progress Notes (Signed)
ANTICOAGULATION CONSULT NOTE  Pharmacy Consult for Heparin Indication: atrial fibrillation   Allergies  Allergen Reactions   Entresto [Sacubitril-Valsartan]     Worsening renal function and weight gain    Patient Measurements: Height: 5\' 11"  (180.3 cm) Weight: 72.8 kg (160 lb 7.9 oz) IBW/kg (Calculated) : 75.3 Heparin Dosing Weight: 80.3 kg   Vital Signs: Temp: 99.2 F (37.3 C) (12/12 0255) Temp Source: Oral (12/12 0255) BP: 193/62 (12/12 0338) Pulse Rate: 108 (12/12 0338)  Labs: Recent Labs    11/05/21 0314 11/05/21 1649 11/06/21 0205 11/06/21 1225 11/07/21 0256  HGB 10.6*  --  10.3*  --  9.3*  HCT 32.6*  --  31.6*  --  27.4*  PLT 213  --  209  --  194  HEPARINUNFRC 0.38  --  0.21* 0.44 0.26*  CREATININE 1.71*  1.68* 1.61* 1.51*  --   --      Estimated Creatinine Clearance: 48.9 mL/min (A) (by C-G formula based on SCr of 1.51 mg/dL (H)).  Assessment: 65 y.o. male with Afib to continue on IV heparin.  Heparin level subtherapeutic at 0.26 on 800 units/hr, Hg= 9.3, plt= 194  Goal of Therapy:  Heparin level 0.3-0.7 units/ml Monitor platelets by anticoagulation protocol: Yes   Plan:  Increase heparin to 900 units/hr Heparin level in 6 hours and daily wth CBC daily  79, PharmD Clinical Pharmacist **Pharmacist phone directory can now be found on [email or web address] ([email or web address]).  Listed under [facility].

## 2021-11-07 NOTE — Progress Notes (Signed)
  Transition of Care [facility]) Screening Note   Patient Details  Name: FARMER MCCAHILL Date of Birth: May 26, 1956   Transition of Care [facility]) CM/SW Contact:    Evelyne Makepeace, LCSW Phone Number: 11/07/2021, 3:26 PM    Transition of Care Department [facility]) has reviewed patient and no TOC needs have been identified at this time. Patient remains intubated/sedated on vent. We will continue to monitor patient advancement through interdisciplinary progression rounds. If new patient transition needs arise, please place a TOC consult.

## 2021-11-07 NOTE — Progress Notes (Signed)
ANTICOAGULATION CONSULT NOTE  Pharmacy Consult for Heparin Indication: atrial fibrillation   Allergies  Allergen Reactions   Entresto [Sacubitril-Valsartan]     Worsening renal function and weight gain    Patient Measurements: Height: 5\' 11"  (180.3 cm) Weight: 72.8 kg (160 lb 7.9 oz) IBW/kg (Calculated) : 75.3 Heparin Dosing Weight: 80.3 kg   Vital Signs: Temp: 100.2 F (37.9 C) (12/12 1000) Temp Source: Axillary (12/12 1000) BP: 152/69 (12/12 1000) Pulse Rate: 105 (12/12 1000)  Labs: Recent Labs    11/05/21 0314 11/05/21 1649 11/06/21 0205 11/06/21 1225 11/07/21 0256 11/07/21 1019  HGB 10.6*  --  10.3*  --  9.3*  --   HCT 32.6*  --  31.6*  --  27.4*  --   PLT 213  --  209  --  194  --   HEPARINUNFRC 0.38  --  0.21* 0.44 0.26* 0.29*  CREATININE 1.71*  1.68* 1.61* 1.51*  --  3.54*  --      Estimated Creatinine Clearance: 20.9 mL/min (A) (by C-G formula based on SCr of 3.54 mg/dL (H)).  Assessment: 65 y.o. male with Afib to continue on IV heparin.  Heparin level slightly subtherapeutic at 0.29 on 900 units/hr.  No overt bleeding per RN.  Goal of Therapy:  Heparin level 0.3-0.7 units/ml Monitor platelets by anticoagulation protocol: Yes   Plan:  Increase heparin gtt at 1000 units/hr Check 8 hr heparin level Daily heparin level and CBC Monitor closely for bleeding  Stephanny Tsutsui D. [birth year]03-1985, PharmD, BCPS, BCCCP 11/07/2021, 11:28 AM

## 2021-11-08 ENCOUNTER — Telehealth: Payer: Self-pay | Admitting: Family Medicine

## 2021-11-08 ENCOUNTER — Inpatient Hospital Stay (HOSPITAL_COMMUNITY): Payer: Medicare HMO

## 2021-11-08 DIAGNOSIS — J9602 Acute respiratory failure with hypercapnia: Secondary | ICD-10-CM | POA: Diagnosis not present

## 2021-11-08 DIAGNOSIS — J9601 Acute respiratory failure with hypoxia: Secondary | ICD-10-CM | POA: Diagnosis not present

## 2021-11-08 LAB — BASIC METABOLIC PANEL
Anion gap: 21 — ABNORMAL HIGH (ref 5–15)
BUN: 227 mg/dL — ABNORMAL HIGH (ref 8–23)
CO2: 15 mmol/L — ABNORMAL LOW (ref 22–32)
Calcium: 8.3 mg/dL — ABNORMAL LOW (ref 8.9–10.3)
Chloride: 97 mmol/L — ABNORMAL LOW (ref 98–111)
Creatinine, Ser: 6.28 mg/dL — ABNORMAL HIGH (ref 0.61–1.24)
GFR, Estimated: 9 mL/min — ABNORMAL LOW (ref >60)
Glucose, Bld: 206 mg/dL — ABNORMAL HIGH (ref 70–99)
Potassium: 5.3 mmol/L — ABNORMAL HIGH (ref 3.5–5.1)
Sodium: 133 mmol/L — ABNORMAL LOW (ref 135–145)

## 2021-11-08 LAB — CBC
HCT: 25.7 % — ABNORMAL LOW (ref 39.0–52.0)
Hemoglobin: 8.6 g/dL — ABNORMAL LOW (ref 13.0–17.0)
MCH: 29.6 pg (ref 26.0–34.0)
MCHC: 33.5 g/dL (ref 30.0–36.0)
MCV: 88.3 fL (ref 80.0–100.0)
Platelets: 192 10*3/uL (ref 150–400)
RBC: 2.91 MIL/uL — ABNORMAL LOW (ref 4.22–5.81)
RDW: 15.5 % (ref 11.5–15.5)
WBC: 44.4 10*3/uL — ABNORMAL HIGH (ref 4.0–10.5)
nRBC: 0.6 % — ABNORMAL HIGH (ref 0.0–0.2)

## 2021-11-08 LAB — HEPARIN LEVEL (UNFRACTIONATED): Heparin Unfractionated: 0.53 IU/mL (ref 0.30–0.70)

## 2021-11-08 LAB — RENAL FUNCTION PANEL
Albumin: 1.7 g/dL — ABNORMAL LOW (ref 3.5–5.0)
Albumin: 1.8 g/dL — ABNORMAL LOW (ref 3.5–5.0)
Anion gap: 21 — ABNORMAL HIGH (ref 5–15)
Anion gap: 23 — ABNORMAL HIGH (ref 5–15)
BUN: 210 mg/dL — ABNORMAL HIGH (ref 8–23)
BUN: 232 mg/dL — ABNORMAL HIGH (ref 8–23)
CO2: 14 mmol/L — ABNORMAL LOW (ref 22–32)
CO2: 14 mmol/L — ABNORMAL LOW (ref 22–32)
Calcium: 8.2 mg/dL — ABNORMAL LOW (ref 8.9–10.3)
Calcium: 8.4 mg/dL — ABNORMAL LOW (ref 8.9–10.3)
Chloride: 96 mmol/L — ABNORMAL LOW (ref 98–111)
Chloride: 98 mmol/L (ref 98–111)
Creatinine, Ser: 6.04 mg/dL — ABNORMAL HIGH (ref 0.61–1.24)
Creatinine, Ser: 6.27 mg/dL — ABNORMAL HIGH (ref 0.61–1.24)
GFR, Estimated: 10 mL/min — ABNORMAL LOW (ref >60)
GFR, Estimated: 9 mL/min — ABNORMAL LOW (ref >60)
Glucose, Bld: 221 mg/dL — ABNORMAL HIGH (ref 70–99)
Glucose, Bld: 243 mg/dL — ABNORMAL HIGH (ref 70–99)
Phosphorus: 6.1 mg/dL — ABNORMAL HIGH (ref 2.5–4.6)
Phosphorus: 7.2 mg/dL — ABNORMAL HIGH (ref 2.5–4.6)
Potassium: 5.9 mmol/L — ABNORMAL HIGH (ref 3.5–5.1)
Potassium: 5.3 mmol/L — ABNORMAL HIGH (ref 3.5–5.1)
Sodium: 131 mmol/L — ABNORMAL LOW (ref 135–145)
Sodium: 135 mmol/L (ref 135–145)

## 2021-11-08 LAB — POCT I-STAT 7, (LYTES, BLD GAS, ICA,H+H)
Acid-base deficit: 8 mmol/L — ABNORMAL HIGH (ref 0.0–2.0)
Bicarbonate: 16.6 mmol/L — ABNORMAL LOW (ref 20.0–28.0)
Calcium, Ion: 1.09 mmol/L — ABNORMAL LOW (ref 1.15–1.40)
HCT: 23 % — ABNORMAL LOW (ref 39.0–52.0)
Hemoglobin: 7.8 g/dL — ABNORMAL LOW (ref 13.0–17.0)
O2 Saturation: 98 %
Potassium: 4.6 mmol/L (ref 3.5–5.1)
Sodium: 134 mmol/L — ABNORMAL LOW (ref 135–145)
TCO2: 17 mmol/L — ABNORMAL LOW (ref 22–32)
pCO2 arterial: 27.9 mmHg — ABNORMAL LOW (ref 32.0–48.0)
pH, Arterial: 7.381 (ref 7.350–7.450)
pO2, Arterial: 106 mmHg (ref 83.0–108.0)

## 2021-11-08 LAB — GLUCOSE, CAPILLARY
Glucose-Capillary: 194 mg/dL — ABNORMAL HIGH (ref 70–99)
Glucose-Capillary: 188 mg/dL — ABNORMAL HIGH (ref 70–99)
Glucose-Capillary: 194 mg/dL — ABNORMAL HIGH (ref 70–99)
Glucose-Capillary: 204 mg/dL — ABNORMAL HIGH (ref 70–99)
Glucose-Capillary: 192 mg/dL — ABNORMAL HIGH (ref 70–99)
Glucose-Capillary: 166 mg/dL — ABNORMAL HIGH (ref 70–99)

## 2021-11-08 LAB — AMMONIA: Ammonia: 53 umol/L — ABNORMAL HIGH (ref 9–35)

## 2021-11-08 LAB — MAGNESIUM: Magnesium: 3.3 mg/dL — ABNORMAL HIGH (ref 1.7–2.4)

## 2021-11-08 LAB — OCCULT BLOOD X 1 CARD TO LAB, STOOL: Fecal Occult Bld: POSITIVE — AB

## 2021-11-08 LAB — PROCALCITONIN: Procalcitonin: 26.99 ng/mL

## 2021-11-08 MED ORDER — SODIUM CHLORIDE 0.9 % FOR CRRT: INTRAVENOUS_CENTRAL | Status: DC | PRN

## 2021-11-08 MED ORDER — PRISMASOL BGK 4/2.5 32-4-2.5 MEQ/L EC SOLN
Status: DC
  Filled 2021-11-08 (×13): qty 5000

## 2021-11-08 MED ORDER — LACTULOSE 10 GM/15ML PO SOLN
30.0000 g | Freq: Every day | ORAL | Status: DC
  Administered 2021-11-08 – 2021-11-14 (×5): 30 g
  Filled 2021-11-08 (×5): qty 45

## 2021-11-08 MED ORDER — PRISMASOL BGK 4/2.5 32-4-2.5 MEQ/L REPLACEMENT SOLN
Status: DC
  Filled 2021-11-08 (×3): qty 5000

## 2021-11-08 MED ORDER — PRISMASOL BGK 4/2.5 32-4-2.5 MEQ/L REPLACEMENT SOLN
Status: DC
  Filled 2021-11-08 (×7): qty 5000

## 2021-11-08 MED ORDER — NOREPINEPHRINE 16 MG/250ML-% IV SOLN
0.0000 ug/min | INTRAVENOUS | Status: DC
  Administered 2021-11-08: 20 ug/min via INTRAVENOUS
  Administered 2021-11-09: 14:00:00 18 ug/min via INTRAVENOUS
  Administered 2021-11-10: 10:00:00 34 ug/min via INTRAVENOUS
  Administered 2021-11-10: 01:00:00 33 ug/min via INTRAVENOUS
  Administered 2021-11-11: 03:00:00 12 ug/min via INTRAVENOUS
  Administered 2021-11-12: 14 ug/min via INTRAVENOUS
  Administered 2021-11-12: 23 ug/min via INTRAVENOUS
  Administered 2021-11-13 (×2): 40 ug/min via INTRAVENOUS
  Administered 2021-11-13: 06:00:00 28 ug/min via INTRAVENOUS
  Administered 2021-11-14: 04:00:00 40 ug/min via INTRAVENOUS
  Filled 2021-11-08 (×12): qty 250

## 2021-11-08 MED ORDER — PROSOURCE TF PO LIQD
90.0000 mL | Freq: Three times a day (TID) | ORAL | Status: DC
  Administered 2021-11-08 – 2021-11-14 (×18): 90 mL
  Filled 2021-11-08 (×19): qty 90

## 2021-11-08 MED ORDER — HEPARIN SODIUM (PORCINE) 1000 UNIT/ML DIALYSIS
1000.0000 [IU] | INTRAMUSCULAR | Status: DC | PRN
  Administered 2021-11-14: 11:00:00 2800 [IU] via INTRAVENOUS_CENTRAL
  Filled 2021-11-08 (×2): qty 6
  Filled 2021-11-08: qty 3

## 2021-11-08 MED ORDER — SODIUM BICARBONATE 8.4 % IV SOLN
50.0000 meq | Freq: Once | INTRAVENOUS | Status: AC
  Administered 2021-11-08: 50 meq via INTRAVENOUS
  Filled 2021-11-08: qty 50

## 2021-11-08 MED ORDER — SODIUM CHLORIDE 0.9 % IV SOLN: INTRAVENOUS | Status: DC | PRN

## 2021-11-08 MED ORDER — MIDODRINE HCL 5 MG PO TABS
5.0000 mg | ORAL_TABLET | Freq: Three times a day (TID) | ORAL | Status: DC
  Administered 2021-11-08 – 2021-11-10 (×6): 5 mg
  Filled 2021-11-08 (×6): qty 1

## 2021-11-08 MED ORDER — SODIUM ZIRCONIUM CYCLOSILICATE 10 G PO PACK
10.0000 g | PACK | Freq: Once | ORAL | Status: AC
  Administered 2021-11-08: 10 g
  Filled 2021-11-08: qty 1

## 2021-11-08 MED ORDER — NOREPINEPHRINE 4 MG/250ML-% IV SOLN
0.0000 ug/min | INTRAVENOUS | Status: DC
  Administered 2021-11-08: 2 ug/min via INTRAVENOUS
  Filled 2021-11-08: qty 250

## 2021-11-08 MED ORDER — VITAL 1.5 CAL PO LIQD
1000.0000 mL | ORAL | Status: DC
  Administered 2021-11-08 – 2021-11-14 (×7): 1000 mL
  Filled 2021-11-08 (×3): qty 1000

## 2021-11-08 MED ORDER — JUVEN PO PACK
1.0000 | PACK | Freq: Two times a day (BID) | ORAL | Status: DC
  Administered 2021-11-08 – 2021-11-14 (×12): 1
  Filled 2021-11-08 (×12): qty 1

## 2021-11-08 NOTE — Progress Notes (Signed)
Blood pressure readings lower at 86/62 (71) with fluid removal on CRRT at 0 ml/hr. Notified Dr. Craige Cotta and received orders to insert arterial line and start levophed. Will notify RT.   Joseph [name]

## 2021-11-08 NOTE — Progress Notes (Addendum)
Advanced Heart Failure Rounding Note  PCP-Cardiologist: Minus Breeding, MD   Subjective:    Remains intubated and sedated, FiO2 30%  Off pressors. Completed abx.  PCT 5.05>>2.99>>2.56>>2.63>>26.99  WBC 50>44. Still with fever but improving, tmax  this am 100.5 F  Back in AF, rate 110s  Still anuric. Received Lokelma overnight for K 5.9, 5.3 this am  Objective:   Weight Range: 75.5 kg Body mass index is 23.21 kg/m.   Vital Signs:   Temp:  [99.2 F (37.3 C)-100.9 F (38.3 C)] 99.2 F (37.3 C) (12/13 0754) Pulse Rate:  [90-115] 115 (12/13 0800) Resp:  [19-34] 20 (12/13 0800) BP: (118-173)/(53-79) 118/79 (12/13 0800) SpO2:  [92 %-97 %] 96 % (12/13 0800) FiO2 (%):  [30 %] 30 % (12/13 0734) Weight:  [75.5 kg] 75.5 kg (12/13 0309) Last BM Date: 11/07/21  Weight change: Filed Weights   11/06/21 0106 11/07/21 0253 11/08/21 0309  Weight: 72.6 kg 72.8 kg 75.5 kg    Intake/Output:   Intake/Output Summary (Last 24 hours) at 11/08/2021 0938 Last data filed at 11/08/2021 0800 Gross per 24 hour  Intake 1556.43 ml  Output --  Net 1556.43 ml      Physical Exam   General:  Ill appearing. On Vent HEENT: normal Neck: supple.R IJ HD cath. Carotids 2+ bilat; no bruits. No lymphadenopathy or thryomegaly appreciated. Cor: PMI nondisplaced. Irregular. Tachy. No rubs, gallops or murmurs. Lungs: coarse Abdomen: soft, nontender, + distended.  Extremities: no cyanosis, clubbing, rash, edema Neuro: intubated and sedated   Telemetry   AF 110s (personally reviewed)   Labs    CBC Recent Labs    11/07/21 0256 11/08/21 0306  WBC 49.9* 44.4*  HGB 9.3* 8.6*  HCT 27.4* 25.7*  MCV 87.5 88.3  PLT 194 993   Basic Metabolic Panel Recent Labs    11/07/21 0256 11/08/21 0306 11/08/21 0813  NA 131* 131* PENDING  K 4.1 5.9* 5.3*  CL 98 96* PENDING  CO2 18* 14* PENDING  GLUCOSE 180* 221* 206*  BUN 117* 210* 227*  CREATININE 3.54* 6.04* 6.28*  CALCIUM 8.3* 8.2*  8.3*  MG 2.9* 3.3*  --   PHOS 3.4 6.1*  --    Liver Function Tests Recent Labs    11/07/21 0256 11/08/21 0306  ALBUMIN 1.9* 1.7*   No results for input(s): LIPASE, AMYLASE in the last 72 hours. Cardiac Enzymes No results for input(s): CKTOTAL, CKMB, CKMBINDEX, TROPONINI in the last 72 hours.  BNP: BNP (last 3 results) Recent Labs    10/25/21 1945 10/28/21 1746 11/02/21 0300  BNP 383.0* 377.2* 129.5*    ProBNP (last 3 results) No results for input(s): PROBNP in the last 8760 hours.   D-Dimer No results for input(s): DDIMER in the last 72 hours.  Hemoglobin A1C No results for input(s): HGBA1C in the last 72 hours.  Fasting Lipid Panel No results for input(s): CHOL, HDL, LDLCALC, TRIG, CHOLHDL, LDLDIRECT in the last 72 hours.  Thyroid Function Tests No results for input(s): TSH, T4TOTAL, T3FREE, THYROIDAB in the last 72 hours.  Invalid input(s): FREET3  Other results:   Imaging    DG Chest Port 1 View  Result Date: 11/08/2021 CLINICAL DATA:  Respiratory failure.  Hypoxia.  Hypercapnia. EXAM: PORTABLE CHEST 1 VIEW COMPARISON:  11/06/2021 FINDINGS: Endotracheal tube tip is 3 cm above the carina. Nasogastric tube enters the abdomen. Right internal jugular central line tip is at the SVC RA junction. 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 There is improved aeration  in the left lower lobe. Patchy infiltrate persists in the right upper lobe. IMPRESSION: Lines and tubes in satisfactory position. Improved aeration in the left lower lobe. Persistent patchy infiltrate in the right upper lobe. Electronically Signed   By: Nelson Chimes M.D.   On: 11/08/2021 08:06   VAS [place] LOWER EXTREMITY VENOUS (DVT)  Result Date: 11/07/2021  Lower Venous DVT Study Patient Name:  Joseph [name]  Date of Exam:   11/07/2021 Medical Rec #: [id number]      Accession #:    [id number] Date of Birth: 1956-03-01       Patient Gender: M Patient Age:   65 years Exam Location:  [facility] Procedure:       VAS [place] LOWER EXTREMITY VENOUS (DVT) Referring Phys: CHI ELLISON --------------------------------------------------------------------------------  Indications: Fever.  Comparison Study: no prior Performing Technologist: Archie Patten RVS  Examination Guidelines: A complete evaluation includes B-mode imaging, spectral Doppler, color Doppler, and power Doppler as needed of all accessible portions of each vessel. Bilateral testing is considered an integral part of a complete examination. Limited examinations for reoccurring indications may be performed as noted. The reflux portion of the exam is performed with the patient in reverse Trendelenburg.  +---------+---------------+---------+-----------+----------+--------------+  RIGHT     Compressibility Phasicity Spontaneity Properties Thrombus Aging  +---------+---------------+---------+-----------+----------+--------------+  CFV       Full            Yes       Yes                                    +---------+---------------+---------+-----------+----------+--------------+  SFJ       Full                                                             +---------+---------------+---------+-----------+----------+--------------+  FV Prox   Full                                                             +---------+---------------+---------+-----------+----------+--------------+  FV Mid    Full                                                             +---------+---------------+---------+-----------+----------+--------------+  FV Distal Full                                                             +---------+---------------+---------+-----------+----------+--------------+  PFV       Full                                                             +---------+---------------+---------+-----------+----------+--------------+  POP       Full            Yes       Yes                                    +---------+---------------+---------+-----------+----------+--------------+   PTV       Full                                                             +---------+---------------+---------+-----------+----------+--------------+  PERO      Full                                                             +---------+---------------+---------+-----------+----------+--------------+   +---------+---------------+---------+-----------+----------+--------------+  LEFT      Compressibility Phasicity Spontaneity Properties Thrombus Aging  +---------+---------------+---------+-----------+----------+--------------+  CFV       Full            Yes       Yes                                    +---------+---------------+---------+-----------+----------+--------------+  SFJ       Full                                                             +---------+---------------+---------+-----------+----------+--------------+  FV Prox   Full                                                             +---------+---------------+---------+-----------+----------+--------------+  FV Mid    Full                                                             +---------+---------------+---------+-----------+----------+--------------+  FV Distal Full                                                             +---------+---------------+---------+-----------+----------+--------------+  PFV       Full                                                             +---------+---------------+---------+-----------+----------+--------------+  POP       Full            Yes       Yes                                    +---------+---------------+---------+-----------+----------+--------------+  PTV       Full                                                             +---------+---------------+---------+-----------+----------+--------------+  PERO      Full                                                             +---------+---------------+---------+-----------+----------+--------------+     Summary: BILATERAL: - No evidence of deep  vein thrombosis seen in the lower extremities, bilaterally. -No evidence of popliteal cyst, bilaterally.   *See table(s) above for measurements and observations. Electronically signed by Servando Snare MD on 11/07/2021 at 4:20:30 PM.    Final    VAS [place] UPPER EXTREMITY VENOUS DUPLEX  Result Date: 11/07/2021 UPPER VENOUS STUDY  Patient Name:  Joseph [name]  Date of Exam:   11/07/2021 Medical Rec #: [id number]      Accession #:    [id number] Date of Birth: January 21, 1956       Patient Gender: M Patient Age:   30 years Exam Location:  [facility] Procedure:      VAS [place] UPPER EXTREMITY VENOUS DUPLEX Referring Phys: CHI ELLISON --------------------------------------------------------------------------------  Indications: fever Comparison Study: no prior Performing Technologist: Archie Patten RVS  Examination Guidelines: A complete evaluation includes B-mode imaging, spectral Doppler, color Doppler, and power Doppler as needed of all accessible portions of each vessel. Bilateral testing is considered an integral part of a complete examination. Limited examinations for reoccurring indications may be performed as noted.  Right Findings: +----------+------------+---------+-----------+----------+-----------------+  RIGHT      Compressible Phasicity Spontaneous Properties      Summary       +----------+------------+---------+-----------+----------+-----------------+  IJV            Full        Yes        Yes                                   +----------+------------+---------+-----------+----------+-----------------+  Subclavian     Full        Yes        Yes                                   +----------+------------+---------+-----------+----------+-----------------+  Axillary       Full        Yes        Yes                                   +----------+------------+---------+-----------+----------+-----------------+  Brachial       Full        Yes        Yes                                    +----------+------------+---------+-----------+----------+-----------------+  Radial         Full                                                         +----------+------------+---------+-----------+----------+-----------------+  Ulnar          Full                                                         +----------+------------+---------+-----------+----------+-----------------+  Cephalic       None                                      Age Indeterminate  +----------+------------+---------+-----------+----------+-----------------+  Basilic        Full                                                         +----------+------------+---------+-----------+----------+-----------------+  Left Findings: +----------+------------+---------+-----------+----------+-------+  LEFT       Compressible Phasicity Spontaneous Properties Summary  +----------+------------+---------+-----------+----------+-------+  IJV            Full        Yes        Yes                         +----------+------------+---------+-----------+----------+-------+  Subclavian     Full        Yes        Yes                         +----------+------------+---------+-----------+----------+-------+  Axillary       Full        Yes        Yes                         +----------+------------+---------+-----------+----------+-------+  Brachial       Full        Yes        Yes                         +----------+------------+---------+-----------+----------+-------+  Radial         Full                                               +----------+------------+---------+-----------+----------+-------+  Ulnar  Full                                               +----------+------------+---------+-----------+----------+-------+  Cephalic       Full                                               +----------+------------+---------+-----------+----------+-------+  Basilic        Full                                                +----------+------------+---------+-----------+----------+-------+  Summary:  Right: No evidence of deep vein thrombosis in the upper extremity. 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  Left: No evidence of deep vein thrombosis in the upper extremity. No evidence of superficial vein thrombosis in the upper extremity.  *See table(s) above for measurements and observations.  Diagnosing physician: Servando Snare MD Electronically signed by Servando Snare MD on 11/07/2021 at 4:20:44 PM.    Final      Medications:     Scheduled Medications:  amiodarone  200 mg Per Tube BID   arformoterol  15 mcg Nebulization BID   vitamin C  250 mg Per Tube BID   aspirin  81 mg Per Tube Daily   atorvastatin  80 mg Per Tube QPM   B-complex with vitamin C  1 tablet Per Tube Daily   budesonide (PULMICORT) nebulizer solution  0.5 mg Nebulization BID   chlorhexidine gluconate (MEDLINE KIT)  15 mL Mouth Rinse BID   Chlorhexidine Gluconate Cloth  6 each Topical Daily   feeding supplement (PROSource TF)  90 mL Per Tube QID   insulin aspart  0-20 Units Subcutaneous Q4H   insulin aspart  10 Units Subcutaneous Q4H   insulin glargine-yfgn  35 Units Subcutaneous BID   mouth rinse  15 mL Mouth Rinse 10 times per day   midodrine  5 mg Per Tube Q8H   pantoprazole sodium  40 mg Per Tube Daily   revefenacin  175 mcg Nebulization Daily   sodium chloride flush  10-40 mL Intracatheter Q12H    Infusions:   prismasol BGK 4/2.5      prismasol BGK 4/2.5     sodium chloride Stopped (11/07/21 0835)   dexmedetomidine (PRECEDEX) IV infusion 0.3 mcg/kg/hr (11/08/21 0938)   feeding supplement (VITAL 1.5 CAL) 1,000 mL (11/08/21 0000)   fentaNYL infusion INTRAVENOUS 75 mcg/hr (11/08/21 0800)   heparin 1,000 Units/hr (11/08/21 0800)   prismasol BGK 4/2.5     promethazine (PHENERGAN) injection (IM or IVPB)      PRN Medications: acetaminophen **OR** acetaminophen,  albuterol, fentaNYL, heparin, hydrALAZINE, labetalol, polyethylene glycol, polyvinyl alcohol, promethazine (PHENERGAN) injection (IM or IVPB), senna-docusate, sodium chloride, sodium chloride flush    Patient Profile   65 y/o male w/ CAD, chronic systolic heart failure and CKD IIIb, admitted w/ Influenza A, sepsis PNA, acute hypoxic respiratory failure and AKI on CKD.   Assessment/Plan   1. Septic/distributive shock -  Remains febrile Tm this am 100.5 F. Improving. - WBC  50 > 44 - Completed cefepime.  -  CCM managing. Central line and arterial line out 12/12 - Off pressors - Procal 5 > 3 > 2.6 > 27   2. Acute hypoxic respiratory failure - has PNA/ARDS with diffuse lung injury on CXR - continue vent support per CCM - developed pulmonary edema post volume resuscitation>>CVVHD for volume removal.  - CXR improving. Vent down to 30%. Will try to wean but may need trach.    3. Acute on chronic systolic HF due to iCM - EF stable 35-40% on echo - Volume overloaded in setting of fluid resuscitation for sepsis - poor response to high dose IV diuretics in setting of AKI 2/2 ATN  - CVVHD stopped 12/11. Still anuric. Planning to reinitiate CRRT today. - Off pressors.  - Midodrine down to 5 mg TID - wean as able - Off GDMT due to shock/AKI   4. PAF with RVR - SR 12/07, back in NSR 12/12 - AF today with rate 110s - On po amio 200 mg BID - Can restart gtt if needed for AF with RVR - Continue heparin gtt. Hgb 11>10 > 9.3 > 8.6 today. Follow hgb closely. - Check FOBT. RN has not noted melena  5. NSVT - Runs up to 15 beats noted - On po amio 200 BID - Keep K >4, mag >2   6. AKI on CKD 3b due to ATN/shock - baseline Scr 1.5-2.0. Peaked to 6.3 - renal u/s with medico-renal disease in R>L kidney - Now anuric. CVVHD initiated 12/5, off on 12/11. Planning to restart CVVHD today - BP improving. Midodrine decreased to 5 TID - Keep MAP > 70  7. CAD with elevated trop due to demand  ischemia - continue ASA/statin - doubt ischemia is a major issue here  - no b-blocker with shock  8. Superficial vein thrombosis Rt cephalic vein - On heparin gtt  9. Hyperkalemia - Received 10 g Lokelma this am for K 5.9 - 5.3 on recheck  Length of Stay: 15  [facility], [place], PA-C  9:38 AM   Advanced Heart Failure Team Pager [phone] (M-F; 7a - 5p)  Please contact [facility] for night-coverage after hours (5p -7a ) and weekends on [email or web address]  Agree with above.   Remains intubated/sedated. Off pressors. + low grade fevers. Lines removed yesterday. WBC 50K -> 44K. PCT 2.6 -> 27  (completed 14 days of abx on 12/11)   Back in AF. On po amio and IV heparin. Vent down to 30%   Off CVVHD. Remains anuric. K up -> received Lokelma. Weight climbing   General:  Sedated on vent.  HEENT: normal + ETT Neck: supple. JVP up  Carotids 2+ bilat; no bruits. No lymphadenopathy or thryomegaly appreciated. Cor:  tachy irregular  Lungs: coarse Abdomen: soft, nontender, + distended. Extremities: no cyanosis, clubbing, rash, edema Neuro: intubated/sedated  Remains tenuous. Suspect he may need trach. Will need to re-initiate iHD vs CVVHD soon.   With abrupt rise in PCT suspect he may have another infectious organism. Lines pulled. Low threshold to restart abx. Follow clinically.   Back in AF with RVR. Now on po amio. Will d/w CCM about switching back to IV amio until he is more stable. Continue heparin.  CRITICAL CARE Performed by: Glori Bickers  Total critical care time: 35 minutes  Critical care time was exclusive of separately billable procedures and treating other patients.  Critical care was necessary to treat or prevent imminent or life-threatening deterioration.  Critical care was time spent personally by me (independent  of midlevel providers or residents) on the following activities: development of treatment plan with patient and/or surrogate as well as nursing,  discussions with consultants, evaluation of patient's response to treatment, examination of patient, obtaining history from patient or surrogate, ordering and performing treatments and interventions, ordering and review of laboratory studies, ordering and review of radiographic studies, pulse oximetry and re-evaluation of patient's condition.  Glori Bickers, MD  11:38 AM

## 2021-11-08 NOTE — Procedures (Signed)
Arterial Catheter Insertion Procedure Note  REGGIE BISE  [id number]  03-04-1956  Date:11/08/21  Time:5:49 PM    Provider Performing: Merlene Laughter RRT   Procedure: Insertion of Arterial Line (09983) without US guidance  Indication(s) Blood pressure monitoring and/or need for frequent ABGs  Consent Risks of the procedure as well as the alternatives and risks of each were explained to the patient and/or caregiver.  Consent for the procedure was obtained and is signed in the bedside chart  Anesthesia None   Time Out Verified patient identification, verified procedure, site/side was marked, verified correct patient position, special equipment/implants available, medications/allergies/relevant history reviewed, required imaging and test results available.   Sterile Technique Maximal sterile technique including full sterile barrier drape, hand hygiene, sterile gown, sterile gloves, mask, hair covering, sterile ultrasound probe cover (if used).   Procedure Description Area of catheter insertion was cleaned with chlorhexidine and draped in sterile fashion. Without real-time ultrasound guidance an arterial catheter was placed into the right radial artery.  Appropriate arterial tracings confirmed on monitor.     Complications/Tolerance None; patient tolerated the procedure well.   EBL Minimal   Specimen(s) None

## 2021-11-08 NOTE — Progress Notes (Signed)
eLink Physician-Brief Progress Note Patient Name: Joseph [name] DOB: Apr 16, 1956 MRN: [id number]   Date of Service  11/08/2021  HPI/Events of Note  K+ 5.9, CO2 14.  eICU Interventions  Lokelma 10 gm x 1 via NG tube, Sodium bicarb 1 amp iv x 1.        Thomasene Lot Shanell Aden 11/08/2021, 5:26 AM

## 2021-11-08 NOTE — Progress Notes (Signed)
NAME:  Joseph [name], MRN:  [id number], DOB:  Oct 10, 1956, LOS: 15 ADMISSION DATE:  14-Nov-2021, CONSULTATION DATE:  10/28/2021 REFERRING MD:  Dr. Arbutus Leas, CHIEF COMPLAINT:  Acute hypoxic respiratory failure    History of Present Illness:  65 yo male presented to [facility] ER with respiratory distress and hypoxia.  Found to have COPD exacerbation from Influenza A pneumonia and acute on chronic systolic CHF with acute pulmonary edema.  Started on amiodarone for a fib with RVR.  Failed Bipap and required intubation.  Pertinent  Medical History  CHF, COPD, CAD s/p CABG, HTN, HLD, MRSA bacteremia, and prior GI bleed  Significant Hospital Events:  11/28 admitted at [facility] for acute hypoxic respiratory failure  12/2 intubated and transferred [facility] care  10/31/2021 CVVHD initiated; cefepime started 12/7 off vaso; remains on levo 12/8 restart vaso 12/10 off CRRT 12/11 off pressors; Tm 101.2, increased WBC  Studies:  CT chest November 14, 2021 >> patchy GGO in upper lobes, faint GGO in lower lobes, moderate effusions Echo 10/25/21 >> EF 35 to 40%, mod MR, severe LA dilation Renal u/s 10/29/21 >> medical renal disease Doppler of arms b/ 11/08/21 >> indeterminate SVT Rt cephalic vein Doppler of legs b/l 11/08/21 >> no DVT  Interim History / Subjective:  Received lokelma overnight.  Tm 100.59F.  Objective   Blood pressure (!) 145/53, pulse 92, temperature (!) 100.5 F (38.1 C), temperature source Oral, resp. rate (!) 22, height 5\' 11"  (1.803 m), weight 75.5 kg, SpO2 96 %.    Vent Mode: PRVC FiO2 (%):  [30 %] 30 % Set Rate:  [15 bmp-18 bmp] 18 bmp Vt Set:  [600 mL] 600 mL PEEP:  [5 cmH20] 5 cmH20 Plateau Pressure:  [13 cmH20-18 cmH20] 16 cmH20   Intake/Output Summary (Last 24 hours) at 11/08/2021 0735 Last data filed at 11/08/2021 0700 Gross per 24 hour  Intake 1729.54 ml  Output --  Net 1729.54 ml   Filed Weights   11/06/21 0106 11/07/21 0253 11/08/21 0309  Weight: 72.6 kg 72.8 kg 75.5 kg     Examination:  General - sedated Eyes - pupils reactive ENT - ETT in place Cardiac - regular rate/rhythm, no murmur Chest - scattered rhonchi Abdomen - soft, non tender, + bowel sounds Extremities - no cyanosis, clubbing, or edema Skin - no rashes Neuro - RASS -3  Resolved Hospital Problem list   Elevated troponin from demand ischemia  Assessment & Plan:   Acute hypoxic/hypercapnic respiratory failure from Influenza A pneumonia with ARDS, acute pulmonary edema and COPD exacerbation. - ETT 12/02 >>  - pressure support wean as tolerated; mental status and deconditioning are barriers to weaning - f/u CXR intermittently - goal SpO2 > 90% - continue brovana, yupelri, pulmicort - prn albuterol - suspect he will need trach to assist with vent weaning  New fever 12/11 with leukocytosis. - central line and arterial line d/c'ed 12/12 - check procalcitonin - repeat sputum and blood culture, UA if fever persists after lines  - completed 14 days of Abx on 12/11 >> hold restarting ABx for now  Hypotension. - improved - will change midodrine to 5 mg q8h  A fib with RVR. Acute on chronic systolic CHF. Hx of HTN, HLD, CAD. - continue amiodarone, heparin gtt - hold outpt toprol, demadex, enalapril, aldactone - continue ASA, lipitor - heart failure team following  AKI from [facility] 2nd to septic shock, hypoxia. Hyperkalemia. Metabolic acidosis. CKD 3a. - off CRRT 12/11 - nephrology following - suspect  he will need iHD  Leukocytosis. Anemia of critical illness. - f/u CBC  DM type 2 poorly controlled with hyperglycemia. - SSI - hold outpt jardiance  Acute metabolic encephalopathy from sepsis, hypoxia, and renal failure. - RASS goal 0 to -1  Superficial vein thrombosis of Rt cephalic vein. - already on anticoagulation - keep Rt arm elevated  Pressure injury. - Coccyx stage 1, not present on admission  Best Practice (right click and "Reselect all SmartList Selections"  daily)   Diet/type: tubefeeds DVT prophylaxis: systemic heparin GI prophylaxis: protonix Lines: d/c central line and arterial line 12/12 Foley:  Yes, and it is still needed Code Status:  full code Family: updated Pt's daughter at bedside  Labs:   CMP Latest Ref Rng & Units 11/08/2021 11/07/2021 11/06/2021  Glucose 70 - 99 mg/dL 211(H) 417(E) 081(K)  BUN 8 - 23 mg/dL 481(E) 563(J) 49(F)  Creatinine 0.61 - 1.24 mg/dL 0.26(V) 7.85(Y) 8.50(Y)  Sodium 135 - 145 mmol/L 131(L) 131(L) 131(L)  Potassium 3.5 - 5.1 mmol/L 5.9(H) 4.1 3.8  Chloride 98 - 111 mmol/L 96(L) 98 99  CO2 22 - 32 mmol/L 14(L) 18(L) 23  Calcium 8.9 - 10.3 mg/dL 8.2(L) 8.3(L) 8.2(L)  Total Protein 6.5 - 8.1 g/dL - - -  Total Bilirubin 0.3 - 1.2 mg/dL - - -  Alkaline Phos 38 - 126 U/L - - -  AST 15 - 41 U/L - - -  ALT 0 - 44 U/L - - -    CBC Latest Ref Rng & Units 11/08/2021 11/07/2021 11/06/2021  WBC 4.0 - 10.5 K/uL 44.4(H) 49.9(H) 49.4(H)  Hemoglobin 13.0 - 17.0 g/dL 7.7(A) 1.2(I) 10.3(L)  Hematocrit 39.0 - 52.0 % 25.7(L) 27.4(L) 31.6(L)  Platelets 150 - 400 K/uL 192 194 209    ABG    Component Value Date/Time   PHART 7.265 (L) 10/30/2021 0203   PCO2ART 40.8 10/30/2021 0203   PO2ART 80 (L) 10/30/2021 0203   HCO3 18.5 (L) 10/30/2021 0203   TCO2 20 (L) 10/30/2021 0203   ACIDBASEDEF 8.0 (H) 10/30/2021 0203   O2SAT 95.2 11/06/2021 0205    CBG (last 3)  Recent Labs    11/07/21 1916 11/07/21 2349 11/08/21 0337  GLUCAP 198* 159* 194*    Critical care time: 33 minutes  Coralyn Helling, MD [facility] Pager - [phone] 11/08/2021, 7:35 AM

## 2021-11-08 NOTE — Progress Notes (Signed)
ANTICOAGULATION CONSULT NOTE  Pharmacy Consult for Heparin Indication: atrial fibrillation   Allergies  Allergen Reactions   Entresto [Sacubitril-Valsartan]     Worsening renal function and weight gain    Patient Measurements: Height: 5\' 11"  (180.3 cm) Weight: 75.5 kg (166 lb 7.2 oz) IBW/kg (Calculated) : 75.3 Heparin Dosing Weight: 80.3 kg   Vital Signs: Temp: 99.2 F (37.3 C) (12/13 0754) Temp Source: Oral (12/13 0754) BP: 145/53 (12/13 0700) Pulse Rate: 92 (12/13 0700)  Labs: Recent Labs    11/06/21 0205 11/06/21 1225 11/07/21 0256 11/07/21 1019 11/07/21 1933 11/08/21 0306  HGB 10.3*  --  9.3*  --   --  8.6*  HCT 31.6*  --  27.4*  --   --  25.7*  PLT 209  --  194  --   --  192  HEPARINUNFRC 0.21*   < > 0.26* 0.29* 0.52 0.53  CREATININE 1.51*  --  3.54*  --   --  6.04*   < > = values in this interval not displayed.     Estimated Creatinine Clearance: 12.6 mL/min (A) (by C-G formula based on SCr of 6.04 mg/dL (H)).  Assessment: 65 y.o. male with Afib to continue on IV heparin.  Heparin level therapeutic; no bleeding per RN.  Goal of Therapy:  Heparin level 0.3-0.7 units/ml Monitor platelets by anticoagulation protocol: Yes   Plan:  Continue heparin gtt at 1000 units/hr Daily heparin level and CBC Monitor closely for bleeding  Allin Frix D. 01-03-1983, PharmD, BCPS, BCCCP 11/08/2021, 7:55 AM

## 2021-11-08 NOTE — Progress Notes (Signed)
eLink Physician-Brief Progress Note Patient Name: Joseph [name] DOB: 1956/09/14 MRN: [id number]   Date of Service  11/08/2021  HPI/Events of Note  Patient with episodic fluctuation in blood pressure which seems to have abated for now, etiology is not clear.  eICU Interventions  Continue to monitor closely, will order screens for Carcinoid and Phaeochromocytoma if it recurs and persists.        Joseph [name] 11/08/2021, 10:23 PM

## 2021-11-08 NOTE — Progress Notes (Signed)
Nutrition Follow-up  DOCUMENTATION CODES:   Non-severe (moderate) malnutrition in context of chronic illness  INTERVENTION:   Continue tube feeding via OG tube: - Increase Vital 1.5 to 50 ml/hr (1200 ml/day) - ProSource TF 90 ml TID  Tube feeding regimen provides 2040 kcal, 147 grams of protein, and 917 ml of H2O.   - B-complex with vitamin C daily and vitamin C 250 mg BID to account for losses with CRRT  - 1 packet Juven BID per tube, each packet provides 95 calories, 2.5 grams of protein, and 9.8 grams of carbohydrate; also contains L-arginine and L-glutamine, vitamin C, vitamin E, vitamin B-12, zinc, calcium, and calcium Beta-hydroxy-Beta-methylbutyrate to support wound healing  NUTRITION DIAGNOSIS:   Moderate Malnutrition related to chronic illness (CHF, COPD) as evidenced by moderate fat depletion, moderate muscle depletion.  Ongoing, being addressed via TF  GOAL:   Patient will meet greater than or equal to 90% of their needs  Met via TF  MONITOR:   Vent status, TF tolerance, Skin, Weight trends, Labs, I & O's  REASON FOR ASSESSMENT:   Consult, Ventilator Enteral/tube feeding initiation and management  ASSESSMENT:   Patient is a 65 yo male with hx of CHF, COPD, CAD, CKD-3a who presented with acute respiratory distress, positive for influenza A and AKI.  12/02 - intubated, transferred to [facility] 12/05 - CRRT start 12/11 - CRRT d/c 12/13 - CRRT restarted  Discussed pt with RN and during ICU rounds. CRRT to start back today. Pt with hyperkalemia, hyperphosphatemia. Pt remains anuric. Per RN, rectal tube removed today as pt is no longer having copious liquid stool. RD to add Juven BID to facilitate wound healing now that pt is off pressor support.  Admit weight: 80.5 kg Current weight: 75.5 kg  Patient is currently intubated on ventilator support MV: 14.7 L/min Temp (24hrs), Avg:99.9 F (37.7 C), Min:98.7 F (37.1 C), Max:100.9 F (38.3 C) BP (cuff):  113/71 MAP (cuff): 78  Drips: Precedex Fentanyl Heparin  Medications reviewed and include: vitamin C 250 mg BID, B-complex with vitamin C, SSI q 4 hours, semglee 35 units BID, protonix  Labs reviewed: sodium 133, potassium 5.3, BUN 227, creatinine 6.28, phosphorus 6.1, magnesium 3.3, hemoglobin 8.6, WBC 44.4 CBG's: 134-198 x 24 hours  I/O's: -3.9 L since admit  Diet Order:   Diet Order             Diet NPO time specified  Diet effective now                   EDUCATION NEEDS:   Not appropriate for education at this time  Skin:  Skin Assessment: Skin Integrity Issues: DTI: coccyx Stage II: L heel Other: MASD to sacrum  Last BM:  11/07/21 rectal tube  Height:   Ht Readings from Last 1 Encounters:  10/28/21 '5\' 11"'  (1.803 m)    Weight:   Wt Readings from Last 1 Encounters:  11/08/21 75.5 kg    BMI:  Body mass index is 23.21 kg/m.  Estimated Nutritional Needs:   Kcal:  2000-2200  Protein:  150-170 grams  Fluid:  >/= 2.0 L    Gustavus Bryant, MS, RD, LDN Inpatient Clinical Dietitian Please see AMiON for contact information.

## 2021-11-08 NOTE — Progress Notes (Signed)
[facility] NEPHROLOGY PROGRESS NOTE  Assessment/ Plan:  #Acute kidney injury on CKD IIIa: Likely 2/2 ATN due to septic shock.  Follows with [facility] as an outpatient.  Started CRRT on 12/5 after refractory to Lasix challenge.  Volume status and electrolytes significantly improved and CRRT.  His blood pressures in 80s-90s SBP though patient has not required levophed. He remains anuric and his BUN/sCr continue to rise. We will resume CRRT this PM.   #Acute hypoxic/hypercapnic respiratory failure/ARDS/influenza A: Currently on cefepime, vent per PCCM.  #Septic shock: Off of pressors now.  Blood pressure elevated.  Marked leukocytosis worsening, there is no evidence of infection at the site of insertion of his catheter.   #Acute on chronic systolic heart failure due to ischemic cardiomyopathy: EF 35 to 40%.  Initially trialed on IV diuretics without response therefore volume optimized with CRRT.  Euvolemic now.  Cardiology is following.    #Hyponatremia, hypervolemic: Managed with CRRT.  Continue fluid restriction.   Subjective: Seen and examined in ICU.  Off of pressors. Sedation weaned patient noted to Briefly required levophed ON.  No urine output.  Hypertensive ON.  Objective Vital signs in last 24 hours: Vitals:   11/08/21 1514 11/08/21 1530 11/08/21 1600 11/08/21 1624  BP: 94/73 99/74 93/72 (!) 86/62  Pulse: 94 (!) 104 (!) 102 93  Resp: (!) 23 (!) 23 (!) 26 19  Temp: 98.7 F (37.1 C)     TempSrc: Axillary     SpO2: 96% 97% 96% 96%  Weight:      Height:       Weight change: 2.7 kg  Intake/Output Summary (Last 24 hours) at 11/08/2021 1631 Last data filed at 11/08/2021 1600 Gross per 24 hour  Intake 2224.88 ml  Output 28 ml  Net 2196.88 ml        Labs: Basic Metabolic Panel: Recent Labs  Lab 11/06/21 0205 11/07/21 0256 11/08/21 0306 11/08/21 0813  NA 131* 131* 131* 133*  K 3.8 4.1 5.9* 5.3*  CL 99 98 96* 97*  CO2 23 18* 14* 15*  GLUCOSE 195* 180*  221* 206*  BUN 50* 117* 210* 227*  CREATININE 1.51* 3.54* 6.04* 6.28*  CALCIUM 8.2* 8.3* 8.2* 8.3*  PHOS 3.5 3.4 6.1*  --     Liver Function Tests: Recent Labs  Lab 11/02/21 0300 11/02/21 1642 11/06/21 0205 11/07/21 0256 11/08/21 0306  AST 22  --   --   --   --   ALT 25  --   --   --   --   ALKPHOS 60  --   --   --   --   BILITOT 0.9  --   --   --   --   PROT 5.1*  --   --   --   --   ALBUMIN 1.9*   < > 2.2* 1.9* 1.7*   < > = values in this interval not displayed.    No results for input(s): LIPASE, AMYLASE in the last 168 hours. Recent Labs  Lab 11/08/21 1158  AMMONIA 53*   CBC: Recent Labs  Lab 11/04/21 0421 11/05/21 0314 11/06/21 0205 11/07/21 0256 11/08/21 0306  WBC 35.3* 36.9* 49.4* 49.9* 44.4*  NEUTROABS 29.3*  --   --   --   --   HGB 11.3* 10.6* 10.3* 9.3* 8.6*  HCT 34.4* 32.6* 31.6* 27.4* 25.7*  MCV 88.0 88.3 88.8 87.5 88.3  PLT 256 213 209 194 192    Cardiac Enzymes: No  results for input(s): CKTOTAL, CKMB, CKMBINDEX, TROPONINI in the last 168 hours. CBG: Recent Labs  Lab 11/07/21 2349 11/08/21 0337 11/08/21 0751 11/08/21 1108 11/08/21 1513  GLUCAP 159* 194* 188* 194* 204*     Iron Studies: No results for input(s): IRON, TIBC, TRANSFERRIN, FERRITIN in the last 72 hours. Studies/Results: DG Chest Port 1 View  Result Date: 11/08/2021 CLINICAL DATA:  Respiratory failure.  Hypoxia.  Hypercapnia. EXAM: PORTABLE CHEST 1 VIEW COMPARISON:  11/06/2021 FINDINGS: Endotracheal tube tip is 3 cm above the carina. Nasogastric tube enters the abdomen. Right internal jugular central line tip is at the SVC RA junction. 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 There is improved aeration in the left lower lobe. Patchy infiltrate persists in the right upper lobe. IMPRESSION: Lines and tubes in satisfactory position. Improved aeration in the left lower lobe. Persistent patchy infiltrate in the right upper lobe. Electronically Signed   By: Nelson Chimes M.D.   On: 11/08/2021  08:06   VAS [place] LOWER EXTREMITY VENOUS (DVT)  Result Date: 11/07/2021  Lower Venous DVT Study Patient Name:  GAYLEN VENNING  Date of Exam:   11/07/2021 Medical Rec #: [id number]      Accession #:    [id number] Date of Birth: 01/25/1956       Patient Gender: M Patient Age:   65 years Exam Location:  [facility] Procedure:      VAS [place] LOWER EXTREMITY VENOUS (DVT) Referring Phys: CHI ELLISON --------------------------------------------------------------------------------  Indications: Fever.  Comparison Study: no prior Performing Technologist: Archie Patten RVS  Examination Guidelines: A complete evaluation includes B-mode imaging, spectral Doppler, color Doppler, and power Doppler as needed of all accessible portions of each vessel. Bilateral testing is considered an integral part of a complete examination. Limited examinations for reoccurring indications may be performed as noted. The reflux portion of the exam is performed with the patient in reverse Trendelenburg.  +---------+---------------+---------+-----------+----------+--------------+  RIGHT     Compressibility Phasicity Spontaneity Properties Thrombus Aging  +---------+---------------+---------+-----------+----------+--------------+  CFV       Full            Yes       Yes                                    +---------+---------------+---------+-----------+----------+--------------+  SFJ       Full                                                             +---------+---------------+---------+-----------+----------+--------------+  FV Prox   Full                                                             +---------+---------------+---------+-----------+----------+--------------+  FV Mid    Full                                                             +---------+---------------+---------+-----------+----------+--------------+  FV Distal Full                                                              +---------+---------------+---------+-----------+----------+--------------+  PFV       Full                                                             +---------+---------------+---------+-----------+----------+--------------+  POP       Full            Yes       Yes                                    +---------+---------------+---------+-----------+----------+--------------+  PTV       Full                                                             +---------+---------------+---------+-----------+----------+--------------+  PERO      Full                                                             +---------+---------------+---------+-----------+----------+--------------+   +---------+---------------+---------+-----------+----------+--------------+  LEFT      Compressibility Phasicity Spontaneity Properties Thrombus Aging  +---------+---------------+---------+-----------+----------+--------------+  CFV       Full            Yes       Yes                                    +---------+---------------+---------+-----------+----------+--------------+  SFJ       Full                                                             +---------+---------------+---------+-----------+----------+--------------+  FV Prox   Full                                                             +---------+---------------+---------+-----------+----------+--------------+  FV Mid    Full                                                             +---------+---------------+---------+-----------+----------+--------------+  FV Distal Full                                                             +---------+---------------+---------+-----------+----------+--------------+  PFV       Full                                                             +---------+---------------+---------+-----------+----------+--------------+  POP       Full            Yes       Yes                                     +---------+---------------+---------+-----------+----------+--------------+  PTV       Full                                                             +---------+---------------+---------+-----------+----------+--------------+  PERO      Full                                                             +---------+---------------+---------+-----------+----------+--------------+     Summary: BILATERAL: - No evidence of deep vein thrombosis seen in the lower extremities, bilaterally. -No evidence of popliteal cyst, bilaterally.   *See table(s) above for measurements and observations. Electronically signed by Servando Snare MD on 11/07/2021 at 4:20:30 PM.    Final    VAS [place] UPPER EXTREMITY VENOUS DUPLEX  Result Date: 11/07/2021 UPPER VENOUS STUDY  Patient Name:  ROBERTS BON  Date of Exam:   11/07/2021 Medical Rec #: [id number]      Accession #:    [id number] Date of Birth: [birth year]       Patient Gender: M Patient Age:   34 years Exam Location:  [facility] Procedure:      VAS [place] UPPER EXTREMITY VENOUS DUPLEX Referring Phys: CHI ELLISON --------------------------------------------------------------------------------  Indications: fever Comparison Study: no prior Performing Technologist: Archie Patten RVS  Examination Guidelines: A complete evaluation includes B-mode imaging, spectral Doppler, color Doppler, and power Doppler as needed of all accessible portions of each vessel. Bilateral testing is considered an integral part of a complete examination. Limited examinations for reoccurring indications may be performed as noted.  Right Findings: +----------+------------+---------+-----------+----------+-----------------+  RIGHT      Compressible Phasicity Spontaneous Properties      Summary       +----------+------------+---------+-----------+----------+-----------------+  IJV            Full        Yes        Yes                                    +----------+------------+---------+-----------+----------+-----------------+  Subclavian     Full        Yes        Yes                                   +----------+------------+---------+-----------+----------+-----------------+  Axillary       Full        Yes        Yes                                   +----------+------------+---------+-----------+----------+-----------------+  Brachial       Full        Yes        Yes                                   +----------+------------+---------+-----------+----------+-----------------+  Radial         Full                                                         +----------+------------+---------+-----------+----------+-----------------+  Ulnar          Full                                                         +----------+------------+---------+-----------+----------+-----------------+  Cephalic       None                                      Age Indeterminate  +----------+------------+---------+-----------+----------+-----------------+  Basilic        Full                                                         +----------+------------+---------+-----------+----------+-----------------+  Left Findings: +----------+------------+---------+-----------+----------+-------+  LEFT       Compressible Phasicity Spontaneous Properties Summary  +----------+------------+---------+-----------+----------+-------+  IJV            Full        Yes        Yes                         +----------+------------+---------+-----------+----------+-------+  Subclavian     Full        Yes        Yes                         +----------+------------+---------+-----------+----------+-------+  Axillary       Full        Yes        Yes                         +----------+------------+---------+-----------+----------+-------+  Brachial       Full        Yes        Yes                         +----------+------------+---------+-----------+----------+-------+  Radial         Full                                                +----------+------------+---------+-----------+----------+-------+  Ulnar          Full                                               +----------+------------+---------+-----------+----------+-------+  Cephalic       Full                                               +----------+------------+---------+-----------+----------+-------+  Basilic        Full                                               +----------+------------+---------+-----------+----------+-------+  Summary:  Right: No evidence of deep vein thrombosis in the upper extremity. 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  Left: No evidence of deep vein thrombosis in the upper extremity. No evidence of superficial vein thrombosis in the upper extremity.  *See table(s) above for measurements and observations.  Diagnosing physician: Servando Snare MD Electronically signed by Servando Snare MD on 11/07/2021 at 4:20:44 PM.    Final     Medications: Infusions:   prismasol BGK 4/2.5 200 mL/hr at 11/08/21 1255    prismasol BGK 4/2.5 200 mL/hr at 11/08/21 1255   sodium chloride Stopped (11/07/21 0835)   sodium chloride     dexmedetomidine (PRECEDEX) IV infusion 0.4 mcg/kg/hr (11/08/21 1600)   feeding supplement (VITAL 1.5 CAL) 1,000 mL (11/08/21 1204)   fentaNYL infusion INTRAVENOUS 50 mcg/hr (11/08/21 1600)   heparin 1,000 Units/hr (11/08/21 1600)   norepinephrine (LEVOPHED) Adult infusion     prismasol BGK 4/2.5 1,000 mL/hr at 11/08/21 1256   promethazine (PHENERGAN) injection (IM or IVPB)      Scheduled Medications:  amiodarone  200 mg Per Tube BID   arformoterol  15 mcg Nebulization BID   vitamin C  250 mg Per Tube BID   aspirin  81 mg Per Tube Daily   atorvastatin  80 mg Per Tube QPM   B-complex with vitamin C  1 tablet Per Tube Daily   budesonide (PULMICORT) nebulizer solution  0.5 mg Nebulization BID   chlorhexidine gluconate (MEDLINE KIT)  15 mL  Mouth Rinse BID   Chlorhexidine Gluconate Cloth  6 each Topical Daily   feeding supplement (PROSource TF)  90 mL Per Tube TID   insulin aspart  0-20 Units Subcutaneous Q4H   insulin aspart  10 Units Subcutaneous Q4H   insulin glargine-yfgn  35 Units Subcutaneous BID   lactulose  30 g Per Tube Daily   mouth rinse  15 mL Mouth Rinse 10 times per day   midodrine  5 mg Per Tube Q8H   nutrition supplement (JUVEN)  1 packet Per Tube BID BM   pantoprazole sodium  40 mg Per Tube Daily   revefenacin  175 mcg Nebulization Daily   sodium chloride flush  10-40 mL Intracatheter Q12H    have reviewed scheduled and prn medications.  Physical Exam: General: Critically ill looking male, sedated and intubated, NAD Heart: Tachycardic  Lungs: Coarse breath sounds bilaterally Abdomen:soft,  non-distended Extremities:No edema of BLE  Neuro: Opens eyes spontaneously, no purposeful movement.  Dialysis Access: Temporary HD catheter in place.  Insertion site clean w/ no evidence of pus  Nekeisha Aure 11/08/2021,4:31 PM  LOS: 15 days

## 2021-11-09 LAB — CBC
HCT: 24.8 % — ABNORMAL LOW (ref 39.0–52.0)
Hemoglobin: 8.5 g/dL — ABNORMAL LOW (ref 13.0–17.0)
MCH: 29.6 pg (ref 26.0–34.0)
MCHC: 34.3 g/dL (ref 30.0–36.0)
MCV: 86.4 fL (ref 80.0–100.0)
Platelets: 247 10*3/uL (ref 150–400)
RBC: 2.87 MIL/uL — ABNORMAL LOW (ref 4.22–5.81)
RDW: 15.8 % — ABNORMAL HIGH (ref 11.5–15.5)
WBC: 43.8 10*3/uL — ABNORMAL HIGH (ref 4.0–10.5)
nRBC: 0.4 % — ABNORMAL HIGH (ref 0.0–0.2)

## 2021-11-09 LAB — HEPARIN LEVEL (UNFRACTIONATED)
Heparin Unfractionated: 0.94 IU/mL — ABNORMAL HIGH (ref 0.30–0.70)
Heparin Unfractionated: 0.56 IU/mL (ref 0.30–0.70)

## 2021-11-09 LAB — RENAL FUNCTION PANEL
Albumin: 1.7 g/dL — ABNORMAL LOW (ref 3.5–5.0)
Albumin: 1.7 g/dL — ABNORMAL LOW (ref 3.5–5.0)
Anion gap: 15 (ref 5–15)
Anion gap: 15 (ref 5–15)
BUN: 164 mg/dL — ABNORMAL HIGH (ref 8–23)
BUN: 139 mg/dL — ABNORMAL HIGH (ref 8–23)
CO2: 18 mmol/L — ABNORMAL LOW (ref 22–32)
CO2: 18 mmol/L — ABNORMAL LOW (ref 22–32)
Calcium: 7.9 mg/dL — ABNORMAL LOW (ref 8.9–10.3)
Calcium: 8 mg/dL — ABNORMAL LOW (ref 8.9–10.3)
Chloride: 99 mmol/L (ref 98–111)
Chloride: 104 mmol/L (ref 98–111)
Creatinine, Ser: 4.1 mg/dL — ABNORMAL HIGH (ref 0.61–1.24)
Creatinine, Ser: 3.22 mg/dL — ABNORMAL HIGH (ref 0.61–1.24)
GFR, Estimated: 15 mL/min — ABNORMAL LOW (ref >60)
GFR, Estimated: 20 mL/min — ABNORMAL LOW (ref >60)
Glucose, Bld: 237 mg/dL — ABNORMAL HIGH (ref 70–99)
Glucose, Bld: 155 mg/dL — ABNORMAL HIGH (ref 70–99)
Phosphorus: 4.5 mg/dL (ref 2.5–4.6)
Phosphorus: 3.9 mg/dL (ref 2.5–4.6)
Potassium: 5 mmol/L (ref 3.5–5.1)
Potassium: 4.9 mmol/L (ref 3.5–5.1)
Sodium: 132 mmol/L — ABNORMAL LOW (ref 135–145)
Sodium: 137 mmol/L (ref 135–145)

## 2021-11-09 LAB — GLUCOSE, CAPILLARY
Glucose-Capillary: 173 mg/dL — ABNORMAL HIGH (ref 70–99)
Glucose-Capillary: 180 mg/dL — ABNORMAL HIGH (ref 70–99)
Glucose-Capillary: 191 mg/dL — ABNORMAL HIGH (ref 70–99)
Glucose-Capillary: 130 mg/dL — ABNORMAL HIGH (ref 70–99)
Glucose-Capillary: 154 mg/dL — ABNORMAL HIGH (ref 70–99)
Glucose-Capillary: 170 mg/dL — ABNORMAL HIGH (ref 70–99)

## 2021-11-09 LAB — PROCALCITONIN: Procalcitonin: 16.53 ng/mL

## 2021-11-09 LAB — MRSA NEXT GEN BY PCR, NASAL: MRSA by PCR Next Gen: NOT DETECTED

## 2021-11-09 LAB — LACTIC ACID, PLASMA
Lactic Acid, Venous: 4 mmol/L (ref 0.5–1.9)
Lactic Acid, Venous: 2.7 mmol/L (ref 0.5–1.9)

## 2021-11-09 LAB — MAGNESIUM: Magnesium: 2.6 mg/dL — ABNORMAL HIGH (ref 1.7–2.4)

## 2021-11-09 MED ORDER — VANCOMYCIN HCL 1500 MG/300ML IV SOLN
1500.0000 mg | Freq: Once | INTRAVENOUS | Status: AC
  Administered 2021-11-09: 12:00:00 1500 mg via INTRAVENOUS
  Filled 2021-11-09: qty 300

## 2021-11-09 MED ORDER — FENTANYL CITRATE (PF) 100 MCG/2ML IJ SOLN
50.0000 ug | INTRAMUSCULAR | Status: DC | PRN
  Administered 2021-11-09 – 2021-11-11 (×5): 100 ug via INTRAVENOUS
  Administered 2021-11-11: 200 ug via INTRAVENOUS
  Administered 2021-11-11 – 2021-11-13 (×7): 100 ug via INTRAVENOUS
  Filled 2021-11-09 (×4): qty 2
  Filled 2021-11-09: qty 4
  Filled 2021-11-09 (×8): qty 2

## 2021-11-09 MED ORDER — LACTATED RINGERS IV BOLUS
500.0000 mL | Freq: Once | INTRAVENOUS | Status: AC
Start: 2021-11-09 — End: 2021-11-09
  Administered 2021-11-09: 17:00:00 500 mL via INTRAVENOUS

## 2021-11-09 MED ORDER — LACTATED RINGERS IV BOLUS
500.0000 mL | Freq: Once | INTRAVENOUS | Status: AC
  Administered 2021-11-09: 14:00:00 500 mL via INTRAVENOUS

## 2021-11-09 MED ORDER — AMIODARONE HCL IN DEXTROSE 360-4.14 MG/200ML-% IV SOLN
60.0000 mg/h | INTRAVENOUS | Status: AC
  Administered 2021-11-09: 23:00:00 60 mg/h via INTRAVENOUS
  Filled 2021-11-09: qty 200

## 2021-11-09 MED ORDER — AMIODARONE HCL IN DEXTROSE 360-4.14 MG/200ML-% IV SOLN
30.0000 mg/h | INTRAVENOUS | Status: DC
  Administered 2021-11-10 – 2021-11-14 (×9): 30 mg/h via INTRAVENOUS
  Filled 2021-11-09 (×9): qty 200

## 2021-11-09 MED ORDER — FENTANYL CITRATE (PF) 100 MCG/2ML IJ SOLN
50.0000 ug | INTRAMUSCULAR | Status: DC | PRN
  Administered 2021-11-09 – 2021-11-13 (×2): 50 ug via INTRAVENOUS
  Filled 2021-11-09 (×2): qty 2

## 2021-11-09 MED ORDER — AMIODARONE HCL IN DEXTROSE 360-4.14 MG/200ML-% IV SOLN
INTRAVENOUS | Status: AC
  Administered 2021-11-09: 17:00:00 60 mg/h via INTRAVENOUS
  Filled 2021-11-09: qty 200

## 2021-11-09 MED ORDER — ANIDULAFUNGIN 100 MG IV SOLR
200.0000 mg | Freq: Once | INTRAVENOUS | Status: AC
  Administered 2021-11-09: 15:00:00 200 mg via INTRAVENOUS
  Filled 2021-11-09: qty 200

## 2021-11-09 MED ORDER — AMIODARONE LOAD VIA INFUSION
150.0000 mg | Freq: Once | INTRAVENOUS | Status: DC
  Filled 2021-11-09: qty 83.34

## 2021-11-09 MED ORDER — DOCUSATE SODIUM 50 MG/5ML PO LIQD: 100.0000 mg | Freq: Two times a day (BID) | ORAL | Status: DC

## 2021-11-09 MED ORDER — VANCOMYCIN HCL 750 MG/150ML IV SOLN
750.0000 mg | INTRAVENOUS | Status: DC
  Administered 2021-11-10 – 2021-11-13 (×4): 750 mg via INTRAVENOUS
  Filled 2021-11-09 (×5): qty 150

## 2021-11-09 MED ORDER — AMIODARONE IV BOLUS ONLY 150 MG/100ML
150.0000 mg | Freq: Once | INTRAVENOUS | Status: AC
  Administered 2021-11-09: 17:00:00 150 mg via INTRAVENOUS
  Filled 2021-11-09: qty 100

## 2021-11-09 MED ORDER — PIPERACILLIN-TAZOBACTAM 3.375 G IVPB 30 MIN
3.3750 g | Freq: Four times a day (QID) | INTRAVENOUS | Status: DC
  Administered 2021-11-09 – 2021-11-11 (×8): 3.375 g via INTRAVENOUS
  Filled 2021-11-09 (×17): qty 50

## 2021-11-09 MED ORDER — POLYETHYLENE GLYCOL 3350 17 G PO PACK: 17.0000 g | PACK | Freq: Every day | ORAL | Status: DC

## 2021-11-09 MED ORDER — SODIUM CHLORIDE 0.9 % IV SOLN
100.0000 mg | INTRAVENOUS | Status: DC
  Administered 2021-11-10: 100 mg via INTRAVENOUS
  Filled 2021-11-09 (×2): qty 100

## 2021-11-09 MED ORDER — AMIODARONE IV BOLUS ONLY 150 MG/100ML
150.0000 mg | Freq: Once | INTRAVENOUS | Status: AC
  Administered 2021-11-09: 04:00:00 150 mg via INTRAVENOUS
  Filled 2021-11-09: qty 100

## 2021-11-09 MED ORDER — SODIUM CHLORIDE 0.9 % IV SOLN: INTRAVENOUS | Status: DC | PRN

## 2021-11-09 MED ORDER — VASOPRESSIN 20 UNITS/100 ML INFUSION FOR SHOCK
0.0000 [IU]/min | INTRAVENOUS | Status: DC
  Administered 2021-11-09 – 2021-11-14 (×11): 0.03 [IU]/min via INTRAVENOUS
  Filled 2021-11-09 (×11): qty 100

## 2021-11-09 MED ORDER — VASOPRESSIN 20 UNITS/100 ML INFUSION FOR SHOCK: 0.0000 [IU]/min | INTRAVENOUS | Status: DC

## 2021-11-09 NOTE — Progress Notes (Signed)
An USGPIV (ultrasound guided PIV) has been placed for short-term vasopressor infusion. A correctly placed ivWatch must be used when administering Vasopressors. Should this treatment be needed beyond 72 hours, central line access should be obtained.  It will be the responsibility of the bedside nurse to follow best practice to prevent extravasations. X placed at tip site. Notified nurse. VU.

## 2021-11-09 NOTE — Progress Notes (Signed)
Pharmacy Antibiotic Note  Joseph [name] is a 65 y.o. male admitted on 10/16/2021 with acute respiratory failure secondary to Flu and COPD exacerbation.  S/p a course of Tamiflu.  Initially started on Rocephin and doxycycline for CAP, then broadened to cefepime for 10 days when patient transferred into the ICU.  He has persistent fever and leukocytosis.  Pharmacy has been consulted for vancomycin and Zosyn dosing for PNA.  He is also starting empiric Eraxis.    CRRT resumed, now hypothermic on CRRT, WBC 43.8, PCT 16.  Plan: Vanc 1500mg  IV x 1, then 750mg  IV Q24H Zosyn 3.375gm IV Q6H, 30 min infusion Eraxis 200mg  IV x 1, then 100mg  IV Q24H Monitor CRRT tolerance/interruption, clinical progress, vanc level as indicated  Height: 5\' 11"  (180.3 cm) Weight: 73.6 kg (162 lb 4.1 oz) IBW/kg (Calculated) : 75.3  Temp (24hrs), Avg:97 F (36.1 C), Min:96.1 F (35.6 C), Max:98.7 F (37.1 C)  Recent Labs  Lab 11/05/21 0314 11/05/21 1649 11/06/21 0205 11/07/21 0256 11/08/21 0306 11/08/21 0813 11/08/21 1608 11/09/21 0409  WBC 36.9*  --  49.4* 49.9* 44.4*  --   --  43.8*  CREATININE 1.71*   1.68*   < > 1.51* 3.54* 6.04* 6.28* 6.27* 4.10*   < > = values in this interval not displayed.    Estimated Creatinine Clearance: 18.2 mL/min (A) (by C-G formula based on SCr of 4.1 mg/dL (H)).    Allergies  Allergen Reactions   Entresto [Sacubitril-Valsartan]     Worsening renal function and weight gain    Doxy 11/29 >> 12/2 CTX 11/29 >> 12/2 Vanc 12/2 >> 12/3, restart 12/14 >> Zosyn 12/14 >>  Eraxis 12/14 >> Cefepime 12/2 >> 12/11 Tamiflu 11/28 >>12/3   11/28 BCx - negative 11/28 Flu A - positive 11/29 MRSA PCR - negative 12/2 TA - negative 12/6 TA - C. tropicalis 12/14 MRSA PCR -  12/14 BCx -  12/14 TA -   Alexsander Cavins D. 14/2, PharmD, BCPS, BCCCP 11/09/2021, 11:10 AM

## 2021-11-09 NOTE — Progress Notes (Addendum)
NAME:  ZYLIN KUHNKE, MRN:  [id number], DOB:  [birth year], LOS: 38 ADMISSION DATE:  10/03/2021, CONSULTATION DATE:  10/28/2021 REFERRING MD:  Dr. Carles Collet, CHIEF COMPLAINT:  Acute hypoxic respiratory failure    History of Present Illness:  65 yo male presented to [facility] ER with respiratory distress and hypoxia.  Found to have COPD exacerbation from Influenza A pneumonia and acute on chronic systolic CHF with acute pulmonary edema.  Started on amiodarone for a fib with RVR.  Failed Bipap and required intubation.  Pertinent  Medical History  CHF, COPD, CAD s/p CABG, HTN, HLD, MRSA bacteremia, and prior GI bleed  Significant Hospital Events:  11/28 admitted at [facility] for acute hypoxic respiratory failure. CT chest 10/13/2021 >> patchy GGO in upper lobes, faint GGO in lower lobes, moderate effusions Echo 10/25/21 >> EF 35 to 40%, mod MR, severe LA dilation Renal u/s 10/29/21 >> medical renal disease 12/2 intubated and transferred [facility] care  10/31/2021 CVVHD initiated; cefepime started 12/7 off vaso; remains on levo 12/8 restart vaso 12/10 off CRRT 12/11 off pressors; Tm 101.2, increased WBC 12/12: - central line and arterial line d/c'ed 12/12 12/13: Doppler of arms b >> indeterminate SVT Rt cephalic vein Doppler of legs b/l 11/08/21 >> no DVT, PCT elevated. WBC still in 49s. PCT elevated.  12/14 back on pressors. WBC ct persists. Culturing Blood (has HD cath and PPM) checking sputum. Starting abx (vanc and cefepime) RASS goal changed to 0. Tolerating PSV but mental status preventing extubation   Interim History / Subjective:  Back on pressors   Objective   Blood pressure 116/65, pulse (Abnormal) 112, temperature (Abnormal) 96.3 F (35.7 C), temperature source Axillary, resp. rate (Abnormal) 24, height 5\' 11"  (1.803 m), weight 73.6 kg, SpO2 93 %.    Vent Mode: PRVC FiO2 (%):  [30 %] 30 % Set Rate:  [18 bmp] 18 bmp Vt Set:  [600 mL] 600 mL PEEP:  [5 cmH20] 5 cmH20 Plateau Pressure:  [12  cmH20-18 cmH20] 12 cmH20   Intake/Output Summary (Last 24 hours) at 11/09/2021 0942 Last data filed at 11/09/2021 0900 Gross per 24 hour  Intake 2316.2 ml  Output 2352 ml  Net -35.8 ml   Filed Weights   11/07/21 0253 11/08/21 0309 11/09/21 0500  Weight: 72.8 kg 75.5 kg 73.6 kg    Examination:  General: Critically ill 65 year old male patient who remains on mechanical ventilation, also on CRRT and back on vasoactive drip HEENT normocephalic atraumatic mucous membranes moist orally intubated he has a right IJ HD catheter, the dressing appears unremarkable Pulmonary coarse breath sounds bilaterally, placed on pressure support 5/PEEP 5 tidal volumes in the 700s to 1 L range without accessory use.  Nursing reports purulent endotracheal secretions Chest x-ray personally reviewed this demonstrates endotracheal tube, dialysis catheter, pacemaker all in satisfactory position.  He has bilateral upper lobe airspace disease right greater than left, aeration a little worse today Cardiac tachycardic irregular irregular with atrial fibrillation on telemetry Abdomen soft, does have liquid stools with Flexi-Seal in place Neuro withdraws to pain, eyes open, otherwise not interactive Extremities warm dry no edema lower extremities upper extremity bilateral dependent edema scattered areas of ecchymosis  Resolved Hospital Problem list   Elevated troponin from demand ischemia Hyperkalemia. Assessment & Plan:   Acute hypoxic/hypercapnic respiratory failure from Influenza A pneumonia with ARDS, acute pulmonary edema and COPD exacerbation. -Worried about ongoing pneumonia versus simply volume overload Plan Continuing full ventilator support Pressure support as tolerated with  daily SBT Continue Brovana, Yupelri, and Pulmicort As needed albuterol Repeating sputum culture Volume removal via CRRT Depending on culture may consider noncontrasted CT chest Will talk to family, if not extubated in the next 48  to 72 hours I think we need to consider tracheostomy  On-going SIRS w/ tachycardia and marked leukocytosis.  -off abx since 12/11 -wbc ct remains in 40s -fever likely masked by CRRT Plan Cultures ordered Start empiric vancomycin and cefepime Check lactate  Hypotension. -This had improved, however nursing notes blood pressure very labile on CRRT and now back on norepinephrine Plan Cont Midodrine 5 mg tid  Continue to titrate norepinephrine for mean arterial pressure greater than 65 Checking lactate  A fib with RVR. Acute on chronic systolic CHF (EF 59-93%) Hx of HTN, HLD, CAD. Plan Cont amiodarone and heparin Holding GDMT given hypotension Holding Toprol, Demadex, enalapril and aldactone   AKI from ATN 2nd to septic shock, hypoxia. Metabolic acidosis. CKD 3a. - off CRRT 12/11; then resumed 12/13 Plan Cont CRRT Serial chems  Strict I&O Renal adjust meds   Anemia of critical illness. Plan Trend cbc Transfusion trigger < 7   DM type 2 poorly controlled with hyperglycemia. Plan Ssi (goal 140-180) Increase tubefeed coverage to 15 units Cont semglee 35 units bid   Acute metabolic encephalopathy from sepsis, hypoxia, and renal failure. Plan PAD protocol RASS goal 0 to -1  Superficial vein thrombosis of Rt cephalic vein. - already on anticoagulation Plan Cont ac Keep arm elevated No access this side   Pressure injury. - Coccyx stage 1, not present on admission Plan Cont wound care   Best Practice (right click and "Reselect all SmartList Selections" daily)   Diet/type: tubefeeds DVT prophylaxis: systemic heparin GI prophylaxis: protonix Lines: d/c central line and arterial line 12/12; Right radial a line replaced 12/13 Foley:  N/A Code Status:  full code Family: updated Pt's daughter at bedside Critical care time: 32 min   Simonne Martinet ACNP-BC [facility] Pulmonary/Critical Care Pager # [phone] OR # [phone] if no answer

## 2021-11-09 NOTE — Progress Notes (Signed)
eLink Physician-Brief Progress Note Patient Name: MCKADE GURKA DOB: September 23, 1956 MRN: [id number]   Date of Service  11/09/2021  HPI/Events of Note  Patient in atrial fibrillation with RVR, BP 110/56, MAP 73.  eICU Interventions  Amiodarone 150 mg iv bolus x 1.        Thomasene Lot Maze Corniel 11/09/2021, 3:23 AM

## 2021-11-09 NOTE — Telephone Encounter (Signed)
Okay thanks for the information, all we will want to see him once he gets out

## 2021-11-09 NOTE — Progress Notes (Signed)
ANTICOAGULATION CONSULT NOTE  Pharmacy Consult for Heparin Indication: atrial fibrillation   Allergies  Allergen Reactions   Entresto [Sacubitril-Valsartan]     Worsening renal function and weight gain    Patient Measurements: Height: 5\' 11"  (180.3 cm) Weight: 73.6 kg (162 lb 4.1 oz) IBW/kg (Calculated) : 75.3 Heparin Dosing Weight: 80.3 kg   Vital Signs: Temp: 97.7 F (36.5 C) (12/14 1315) Temp Source: Oral (12/14 1146) BP: 160/76 (12/14 1400) Pulse Rate: 93 (12/14 1445)  Labs: Recent Labs    11/07/21 0256 11/07/21 1019 11/08/21 0306 11/08/21 0813 11/08/21 1608 11/08/21 2008 11/09/21 0409 11/09/21 1331  HGB 9.3*  --  8.6*  --   --  7.8* 8.5*  --   HCT 27.4*  --  25.7*  --   --  23.0* 24.8*  --   PLT 194  --  192  --   --   --  247  --   HEPARINUNFRC 0.26*   < > 0.53  --   --   --  0.94* 0.56  CREATININE 3.54*  --  6.04* 6.28* 6.27*  --  4.10*  --    < > = values in this interval not displayed.     Estimated Creatinine Clearance: 18.2 mL/min (A) (by C-G formula based on SCr of 4.1 mg/dL (H)).  Assessment: 65 y.o. male with Afib to continue on IV heparin.  Heparin level therapeutic; no bleeding per RN.  Goal of Therapy:  Heparin level 0.3-0.7 units/ml Monitor platelets by anticoagulation protocol: Yes   Plan:  Continue heparin gtt at 850 units/hr Daily heparin level and CBC Monitor closely for bleeding  Mariateresa Batra D. 01-03-1985, PharmD, BCPS, BCCCP 11/09/2021, 3:00 PM

## 2021-11-09 NOTE — Progress Notes (Signed)
[facility] NEPHROLOG[facility] PROGRESS NOTE  Assessment/ Plan:  #Acute kidney injury on CKD IIIa: Likely 2/2 ATN due to septic shock.  Follows with [facility] as an outpatient.  Started CRRT on 12/5 after refractory to Lasix challenge.  Volume status and electrolytes significantly improved on CRRT.  CRRT briefly stopped but resumed 12/13 as patient had markedly elevated BUN. His BUN/sCr are down trending on CRRT, however he is requiring pressor support to maintain his blood pressures. - We will continue CRRT, slowly remove fluid   #Acute hypoxic/hypercapnic respiratory failure/ARDS/influenza A: Currently on cefepime, vent per PCCM.  #Septic shock: On pressors now however in the setting of CRRT.  Marked leukocytosis stable, there is no evidence of infection at the site of insertion of his catheter. Cultures ordered per primary and empiric antibiotics intiated.   #Acute on chronic systolic heart failure due to ischemic cardiomyopathy: EF 35 to 40%.  Initially trialed on IV diuretics without response therefore volume optimized with CRRT.  Euvolemic now.  Cardiology is following.    #Hyponatremia, hypervolemic: Managed with CRRT.  Continue fluid restriction.  #Afib with RVR Continue amiodarone and heparin infusion    Subjective: Seen and examined in ICU.  On pressor support. Anuric. Blood pressures low with CRRT.  Objective Vital signs in last 24 hours: Vitals:   11/09/21 0830 11/09/21 0845 11/09/21 0900 11/09/21 1000  BP: (!) 86/60 90/73 116/65 114/86  Pulse: (!) 107 (!) 101 (!) 112 (!) 107  Resp: 17 16 (!) 24 16  Temp:      TempSrc:      SpO2: 95% 94% 93% 96%  Weight:      Height:       Weight change: -1.9 kg  Intake/Output Summary (Last 24 hours) at 11/09/2021 1045 Last data filed at 11/09/2021 1000 Gross per 24 hour  Intake 2687.74 ml  Output 2480 ml  Net 207.74 ml        Labs: Basic Metabolic Panel: Recent Labs  Lab 11/08/21 0306 11/08/21 0813 11/08/21 1608  11/08/21 2008 11/09/21 0409  NA 131* 133* 135 134* 132*  K 5.9* 5.3* 5.3* 4.6 5.0  CL 96* 97* 98  --  99  CO2 14* 15* 14*  --  18*  GLUCOSE 221* 206* 243*  --  237*  BUN 210* 227* 232*  --  164*  CREATININE 6.04* 6.28* 6.27*  --  4.10*  CALCIUM 8.2* 8.3* 8.4*  --  7.9*  PHOS 6.1*  --  7.2*  --  4.5    Liver Function Tests: Recent Labs  Lab 11/08/21 0306 11/08/21 1608 11/09/21 0409  ALBUMIN 1.7* 1.8* 1.7*    No results for input(s): LIPASE, AMYLASE in the last 168 hours. Recent Labs  Lab 11/08/21 1158  AMMONIA 53*    CBC: Recent Labs  Lab 11/04/21 0421 11/05/21 0314 11/06/21 0205 11/07/21 0256 11/08/21 0306 11/08/21 2008 11/09/21 0409  WBC 35.3* 36.9* 49.4* 49.9* 44.4*  --  43.8*  NEUTROABS 29.3*  --   --   --   --   --   --   HGB 11.3* 10.6* 10.3* 9.3* 8.6* 7.8* 8.5*  HCT 34.4* 32.6* 31.6* 27.4* 25.7* 23.0* 24.8*  MCV 88.0 88.3 88.8 87.5 88.3  --  86.4  PLT 256 213 209 194 192  --  247    Cardiac Enzymes: No results for input(s): CKTOTAL, CKMB, CKMBINDEX, TROPONINI in the last 168 hours. CBG: Recent Labs  Lab 11/08/21 1513 11/08/21 1951 11/08/21 2314 11/09/21 0330 11/09/21  0813  GLUCAP 204* 192* 166* 173* 180*     Iron Studies: No results for input(s): IRON, TIBC, TRANSFERRIN, FERRITIN in the last 72 hours. Studies/Results: DG Chest Port 1 View  Result Date: 11/08/2021 CLINICAL DATA:  Respiratory failure.  Hypoxia.  Hypercapnia. EXAM: PORTABLE CHEST 1 VIEW COMPARISON:  11/06/2021 FINDINGS: Endotracheal tube tip is 3 cm above the carina. Nasogastric tube enters the abdomen. Right internal jugular central line tip is at the SVC RA junction. 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 There is improved aeration in the left lower lobe. Patchy infiltrate persists in the right upper lobe. IMPRESSION: Lines and tubes in satisfactory position. Improved aeration in the left lower lobe. Persistent patchy infiltrate in the right upper lobe. Electronically Signed   By:  Nelson Chimes M.D.   On: 11/08/2021 08:06   VAS [place] LOWER EXTREMITY VENOUS (DVT)  Result Date: 11/07/2021  Lower Venous DVT Study Patient Name:  Joseph [name]  Date of Exam:   11/07/2021 Medical Rec #: [id number]      Accession #:    [id number] Date of Birth: 09/14/1956       Patient Gender: M Patient Age:   65 years Exam Location:  [facility] Procedure:      VAS [place] LOWER EXTREMITY VENOUS (DVT) Referring Phys: CHI ELLISON --------------------------------------------------------------------------------  Indications: Fever.  Comparison Study: no prior Performing Technologist: Archie Patten RVS  Examination Guidelines: A complete evaluation includes B-mode imaging, spectral Doppler, color Doppler, and power Doppler as needed of all accessible portions of each vessel. Bilateral testing is considered an integral part of a complete examination. Limited examinations for reoccurring indications may be performed as noted. The reflux portion of the exam is performed with the patient in reverse Trendelenburg.  +---------+---------------+---------+-----------+----------+--------------+  RIGHT     Compressibility Phasicity Spontaneity Properties Thrombus Aging  +---------+---------------+---------+-----------+----------+--------------+  CFV       Full            Yes       Yes                                    +---------+---------------+---------+-----------+----------+--------------+  SFJ       Full                                                             +---------+---------------+---------+-----------+----------+--------------+  FV Prox   Full                                                             +---------+---------------+---------+-----------+----------+--------------+  FV Mid    Full                                                             +---------+---------------+---------+-----------+----------+--------------+  FV Distal Full                                                              +---------+---------------+---------+-----------+----------+--------------+  PFV       Full                                                             +---------+---------------+---------+-----------+----------+--------------+  POP       Full            Yes       Yes                                    +---------+---------------+---------+-----------+----------+--------------+  PTV       Full                                                             +---------+---------------+---------+-----------+----------+--------------+  PERO      Full                                                             +---------+---------------+---------+-----------+----------+--------------+   +---------+---------------+---------+-----------+----------+--------------+  LEFT      Compressibility Phasicity Spontaneity Properties Thrombus Aging  +---------+---------------+---------+-----------+----------+--------------+  CFV       Full            Yes       Yes                                    +---------+---------------+---------+-----------+----------+--------------+  SFJ       Full                                                             +---------+---------------+---------+-----------+----------+--------------+  FV Prox   Full                                                             +---------+---------------+---------+-----------+----------+--------------+  FV Mid    Full                                                             +---------+---------------+---------+-----------+----------+--------------+  FV Distal Full                                                             +---------+---------------+---------+-----------+----------+--------------+  PFV       Full                                                             +---------+---------------+---------+-----------+----------+--------------+  POP       Full            Yes       Yes                                     +---------+---------------+---------+-----------+----------+--------------+  PTV       Full                                                             +---------+---------------+---------+-----------+----------+--------------+  PERO      Full                                                             +---------+---------------+---------+-----------+----------+--------------+     Summary: BILATERAL: - No evidence of deep vein thrombosis seen in the lower extremities, bilaterally. -No evidence of popliteal cyst, bilaterally.   *See table(s) above for measurements and observations. Electronically signed by Servando Snare MD on 11/07/2021 at 4:20:30 PM.    Final    VAS [place] UPPER EXTREMITY VENOUS DUPLEX  Result Date: 11/07/2021 UPPER VENOUS STUDY  Patient Name:  Joseph [name]  Date of Exam:   11/07/2021 Medical Rec #: [id number]      Accession #:    [id number] Date of Birth: July 23, 1956       Patient Gender: M Patient Age:   1 years Exam Location:  [facility] Procedure:      VAS [place] UPPER EXTREMITY VENOUS DUPLEX Referring Phys: CHI ELLISON --------------------------------------------------------------------------------  Indications: fever Comparison Study: no prior Performing Technologist: Archie Patten RVS  Examination Guidelines: A complete evaluation includes B-mode imaging, spectral Doppler, color Doppler, and power Doppler as needed of all accessible portions of each vessel. Bilateral testing is considered an integral part of a complete examination. Limited examinations for reoccurring indications may be performed as noted.  Right Findings: +----------+------------+---------+-----------+----------+-----------------+  RIGHT      Compressible Phasicity Spontaneous Properties      Summary       +----------+------------+---------+-----------+----------+-----------------+  IJV            Full        Yes        Yes                                    +----------+------------+---------+-----------+----------+-----------------+  Subclavian     Full        Yes        Yes                                   +----------+------------+---------+-----------+----------+-----------------+  Axillary       Full        Yes        Yes                                   +----------+------------+---------+-----------+----------+-----------------+  Brachial       Full        Yes        Yes                                   +----------+------------+---------+-----------+----------+-----------------+  Radial         Full                                                         +----------+------------+---------+-----------+----------+-----------------+  Ulnar          Full                                                         +----------+------------+---------+-----------+----------+-----------------+  Cephalic       None                                      Age Indeterminate  +----------+------------+---------+-----------+----------+-----------------+  Basilic        Full                                                         +----------+------------+---------+-----------+----------+-----------------+  Left Findings: +----------+------------+---------+-----------+----------+-------+  LEFT       Compressible Phasicity Spontaneous Properties Summary  +----------+------------+---------+-----------+----------+-------+  IJV            Full        Yes        Yes                         +----------+------------+---------+-----------+----------+-------+  Subclavian     Full        Yes        Yes                         +----------+------------+---------+-----------+----------+-------+  Axillary       Full        Yes        Yes                         +----------+------------+---------+-----------+----------+-------+  Brachial       Full        Yes        Yes                         +----------+------------+---------+-----------+----------+-------+  Radial  Full                                                +----------+------------+---------+-----------+----------+-------+  Ulnar          Full                                               +----------+------------+---------+-----------+----------+-------+  Cephalic       Full                                               +----------+------------+---------+-----------+----------+-------+  Basilic        Full                                               +----------+------------+---------+-----------+----------+-------+  Summary:  Right: No evidence of deep vein thrombosis in the upper extremity. 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  Left: No evidence of deep vein thrombosis in the upper extremity. No evidence of superficial vein thrombosis in the upper extremity.  *See table(s) above for measurements and observations.  Diagnosing physician: Servando Snare MD Electronically signed by Servando Snare MD on 11/07/2021 at 4:20:44 PM.    Final     Medications: Infusions:   prismasol BGK 4/2.5 200 mL/hr at 11/08/21 1255    prismasol BGK 4/2.5 200 mL/hr at 11/08/21 1255   sodium chloride Stopped (11/07/21 0835)   sodium chloride     dexmedetomidine (PRECEDEX) IV infusion Stopped (11/08/21 2005)   feeding supplement (VITAL 1.5 CAL) 1,000 mL (11/08/21 1204)   heparin 850 Units/hr (11/09/21 1000)   norepinephrine (LEVOPHED) Adult infusion 12.5 mcg/min (11/09/21 1000)   prismasol BGK 4/2.5 1,000 mL/hr at 11/09/21 0447   promethazine (PHENERGAN) injection (IM or IVPB)      Scheduled Medications:  amiodarone  200 mg Per Tube BID   arformoterol  15 mcg Nebulization BID   vitamin C  250 mg Per Tube BID   aspirin  81 mg Per Tube Daily   atorvastatin  80 mg Per Tube QPM   B-complex with vitamin C  1 tablet Per Tube Daily   budesonide (PULMICORT) nebulizer solution  0.5 mg Nebulization BID   chlorhexidine gluconate (MEDLINE KIT)  15 mL Mouth Rinse BID   Chlorhexidine Gluconate  Cloth  6 each Topical Daily   docusate  100 mg Per Tube BID   feeding supplement (PROSource TF)  90 mL Per Tube TID   insulin aspart  0-20 Units Subcutaneous Q4H   insulin aspart  10 Units Subcutaneous Q4H   insulin glargine-yfgn  35 Units Subcutaneous BID   lactulose  30 g Per Tube Daily   mouth rinse  15 mL Mouth Rinse 10 times per day   midodrine  5 mg Per Tube Q8H   nutrition supplement (JUVEN)  1 packet Per Tube BID BM   pantoprazole sodium  40 mg Per Tube Daily   polyethylene glycol  17 g Per  Tube Daily   revefenacin  175 mcg Nebulization Daily   sodium chloride flush  10-40 mL Intracatheter Q12H    have reviewed scheduled and prn medications.  Physical Exam: General: Critically ill looking male, sedated and intubated, NAD Heart: Tachycardic  Lungs: Coarse breath sounds bilaterally Abdomen:soft,  non-distended Extremities:No edema of BLE  Neuro: Opens eyes spontaneously, not following commands  Dialysis Access: Temporary HD catheter in place.  Insertion site clean w/ no evidence of pus or surrounding erythema   Tiziana Cislo 11/09/2021,10:45 AM  LOS: 16 days

## 2021-11-09 NOTE — Progress Notes (Signed)
ANTICOAGULATION CONSULT NOTE - Follow Up Consult  Pharmacy Consult for heparin Indication: atrial fibrillation  Labs: Recent Labs    11/07/21 0256 11/07/21 1019 11/07/21 1933 11/08/21 0306 11/08/21 0813 11/08/21 1608 11/08/21 2008 11/09/21 0409  HGB 9.3*  --   --  8.6*  --   --  7.8* 8.5*  HCT 27.4*  --   --  25.7*  --   --  23.0* 24.8*  PLT 194  --   --  192  --   --   --  247  HEPARINUNFRC 0.26*   < > 0.52 0.53  --   --   --  0.94*  CREATININE 3.54*  --   --  6.04* 6.28* 6.27*  --  4.10*   < > = values in this interval not displayed.    Assessment: 65yo male supratherapeutic on heparin after two levels at goal; no infusion issues or signs of bleeding per RN.  Goal of Therapy:  Heparin level 0.3-0.7 units/ml   Plan:  Will decrease heparin infusion by 2 units/kg/hr to 850 units/hr and check level in 8 hours.    Vernard Gambles, PharmD, BCPS  11/09/2021,5:25 AM

## 2021-11-09 NOTE — Progress Notes (Addendum)
Advanced Heart Failure Rounding Note  PCP-Cardiologist: Minus Breeding, MD   Subjective:    Remains intubated, FiO2 30%. Awake on vent but not following commands.   Worsening shock. Lactic acid 2.4>> 4.0. Back on NE currently at 14. MAPs 70s   Back on Abx, Vanc + Zosyn added w/ rise in PCT. Now trending back down 2.63>>26.99>>16.53 WBC 44>>43K. Afebrile. CXR + infiltrate RUL   Respiratory cultures + GN Rod + GPC. BCx NGTD   Also started on empiric Eraxis.   Anuric. On CVVHD, currently pulling 100/hr. CVP not set up     Objective:   Weight Range: 73.6 kg Body mass index is 22.63 kg/m.   Vital Signs:   Temp:  [95 F (35 C)-98.7 F (37.1 C)] 95 F (35 C) (12/14 1146) Pulse Rate:  [63-153] 85 (12/14 1224) Resp:  [12-29] 13 (12/14 1224) BP: (79-177)/(43-113) 79/59 (12/14 1215) SpO2:  [90 %-98 %] 95 % (12/14 1224) Arterial Line BP: (62-230)/(33-78) 125/42 (12/14 1224) FiO2 (%):  [30 %] 30 % (12/14 1112) Weight:  [73.6 kg] 73.6 kg (12/14 0500) Last BM Date: 11/08/21  Weight change: Filed Weights   11/07/21 0253 11/08/21 0309 11/09/21 0500  Weight: 72.8 kg 75.5 kg 73.6 kg    Intake/Output:   Intake/Output Summary (Last 24 hours) at 11/09/2021 1231 Last data filed at 11/09/2021 1200 Gross per 24 hour  Intake 2881.02 ml  Output 2770 ml  Net 111.02 ml      Physical Exam   General:  Critically ill, intubated, awake on vent but not following commands.  HEENT: normal + ETT, dentition in poor repair  Neck: supple. JVD not well visualized. +rt IJ HD cath. Carotids 2+ bilat; no bruits. No lymphadenopathy or thyromegaly appreciated. Cor: PMI nondisplaced. Regular rate & rhythm. No rubs, gallops or murmurs. Lungs: intubated and course  Abdomen: soft, nontender, nondistended. No hepatosplenomegaly. No bruits or masses. Good bowel sounds. Extremities: no cyanosis, clubbing, rash, edema Neuro: intubated, awake on vent. Not following commands    Telemetry   AF  90s  (personally reviewed)   Labs    CBC Recent Labs    11/08/21 0306 11/08/21 2008 11/09/21 0409  WBC 44.4*  --  43.8*  HGB 8.6* 7.8* 8.5*  HCT 25.7* 23.0* 24.8*  MCV 88.3  --  86.4  PLT 192  --  161   Basic Metabolic Panel Recent Labs    11/07/21 0256 11/08/21 0306 11/08/21 0813 11/08/21 1608 11/08/21 2008 11/09/21 0409  NA 131* 131*   < > 135 134* 132*  K 4.1 5.9*   < > 5.3* 4.6 5.0  CL 98 96*   < > 98  --  99  CO2 18* 14*   < > 14*  --  18*  GLUCOSE 180* 221*   < > 243*  --  237*  BUN 117* 210*   < > 232*  --  164*  CREATININE 3.54* 6.04*   < > 6.27*  --  4.10*  CALCIUM 8.3* 8.2*   < > 8.4*  --  7.9*  MG 2.9* 3.3*  --   --   --   --   PHOS 3.4 6.1*  --  7.2*  --  4.5   < > = values in this interval not displayed.   Liver Function Tests Recent Labs    11/08/21 1608 11/09/21 0409  ALBUMIN 1.8* 1.7*   No results for input(s): LIPASE, AMYLASE in the last 72 hours.  No results for input(s): CKTOTAL, CKMB, CKMBINDEX, TROPONINI in the last 72 hours. ° °BNP: °BNP (last 3 results) °Recent Labs  °  10/25/21 °1945 10/28/21 °1746 11/02/21 °0300  °BNP 383.0* 377.2* 129.5*  ° ° °ProBNP (last 3 results) °No results for input(s): PROBNP in the last 8760 hours. ° ° °D-Dimer °No results for input(s): DDIMER in the last 72 hours. ° °Hemoglobin A1C °No results for input(s): HGBA1C in the last 72 hours. ° °Fasting Lipid Panel °No results for input(s): CHOL, HDL, LDLCALC, TRIG, CHOLHDL, LDLDIRECT in the last 72 hours. ° °Thyroid Function Tests °No results for input(s): TSH, T4TOTAL, T3FREE, THYROIDAB in the last 72 hours. ° °Invalid input(s): FREET3 ° °Other results: ° ° °Imaging  ° ° °No results found. ° ° °Medications:   ° ° °Scheduled Medications: ° amiodarone  200 mg Per Tube BID  ° arformoterol  15 mcg Nebulization BID  ° vitamin C  250 mg Per Tube BID  ° aspirin  81 mg Per Tube Daily  ° atorvastatin  80 mg Per Tube QPM  ° B-complex with vitamin C  1 tablet Per Tube  Daily  ° budesonide (PULMICORT) nebulizer solution  0.5 mg Nebulization BID  ° chlorhexidine gluconate (MEDLINE KIT)  15 mL Mouth Rinse BID  ° Chlorhexidine Gluconate Cloth  6 each Topical Daily  ° feeding supplement (PROSource TF)  90 mL Per Tube TID  ° insulin aspart  0-20 Units Subcutaneous Q4H  ° insulin aspart  10 Units Subcutaneous Q4H  ° insulin glargine-yfgn  35 Units Subcutaneous BID  ° lactulose  30 g Per Tube Daily  ° mouth rinse  15 mL Mouth Rinse 10 times per day  ° midodrine  5 mg Per Tube Q8H  ° nutrition supplement (JUVEN)  1 packet Per Tube BID BM  ° pantoprazole sodium  40 mg Per Tube Daily  ° revefenacin  175 mcg Nebulization Daily  ° sodium chloride flush  10-40 mL Intracatheter Q12H  ° ° °Infusions: °  prismasol BGK 4/2.5 200 mL/hr at 11/08/21 1255  °  prismasol BGK 4/2.5 200 mL/hr at 11/08/21 1255  ° sodium chloride Stopped (11/07/21 0835)  ° sodium chloride 10 mL/hr at 11/09/21 1200  ° [START ON 11/10/2021] anidulafungin    ° anidulafungin    ° dexmedetomidine (PRECEDEX) IV infusion Stopped (11/08/21 2005)  ° feeding supplement (VITAL 1.5 CAL) 1,000 mL (11/08/21 1204)  ° heparin 850 Units/hr (11/09/21 1200)  ° norepinephrine (LEVOPHED) Adult infusion 14.5 mcg/min (11/09/21 1200)  ° piperacillin-tazobactam Stopped (11/09/21 1157)  ° prismasol BGK 4/2.5 1,000 mL/hr at 11/09/21 1100  ° promethazine (PHENERGAN) injection (IM or IVPB)    ° vancomycin 1,500 mg (11/09/21 1219)  ° [START ON 11/10/2021] vancomycin    ° ° °PRN Medications: °Place/Maintain arterial line **AND** sodium chloride, acetaminophen **OR** acetaminophen, albuterol, fentaNYL (SUBLIMAZE) injection, fentaNYL (SUBLIMAZE) injection, heparin, hydrALAZINE, labetalol, polyethylene glycol, polyvinyl alcohol, promethazine (PHENERGAN) injection (IM or IVPB), senna-docusate, sodium chloride, sodium chloride flush ° ° ° °Patient Profile  ° °65 y/o male w/ CAD, chronic systolic heart failure and CKD IIIb, admitted w/ Influenza A, sepsis PNA,  acute hypoxic respiratory failure and AKI on CKD.  ° °Assessment/Plan  ° °1. Septic/distributive shock °- Completed cefepime °- WBC  50 > 44>43K  °- Procal 5 > 3 > 2.6 > 27>17  °- Abx restarted, now on Vanc + Zosyn   °- Central line and arterial line out 12/12 °- Empiric Eraxis added 12/14 °- Back on NE   for BP support °- Lactic Acid 4.0, follow trend  °- Keep MAP > 70 ° °  °2. Acute hypoxic respiratory failure °- has PNA/ARDS with diffuse lung injury on CXR °- continue vent support per CCM °- developed pulmonary edema post volume resuscitation>>CVVHD for volume removal.  °- Vent down to 30%. Will try to wean but may need trach.  °  °3. Acute on chronic systolic HF due to iCM °- EF stable 35-40% on echo °- Volume overloaded in setting of fluid resuscitation for sepsis °- poor response to high dose IV diuretics in setting of AKI 2/2 ATN  °- CVVHD stopped 12/11. Still anuric. Now back on CRRT  °- on NE for BP support .  °- Midodrine down to 5 mg TID °- Off GDMT due to shock/AKI °  °4. PAF with RVR °- SR 12/07, back in NSR 12/12 °- Rates currently in 90s  °- On po amio 200 mg BID °- Can restart gtt if needed for AF with RVR °- Continue heparin gtt. Hgb 11>10 > 9.3 > 8.6>8.5 today. Follow hgb closely. °- Consider FOBT. RN has not noted melena ° °5. NSVT °- Runs up to 15 beats noted °- On po amio 200 BID °- Keep K >4, mag >2 °  °6. AKI on CKD 3b due to ATN/shock °- baseline Scr 1.5-2.0. Peaked to 6.3 °- renal u/s with medico-renal disease in R>L kidney °- Now anuric. CVVHD initiated 12/5, off on 12/11. Restarted 12/13  °- Support BP w/ NE + midodrine  °- Keep MAP > 70 ° °7. CAD with elevated trop due to demand ischemia °- continue ASA/statin °- doubt ischemia is a major issue here  °- no b-blocker with shock ° °8. Superficial vein thrombosis Rt cephalic vein °- On heparin gtt ° °9. Hyperkalemia °- Received 10 g Lokelma 12/13 °- K 5.0 today on recheck °- CVVHD  ° °Length of Stay: 16 ° °Joseph Simmons, PA-C  °12:31  PM ° ° °Advanced Heart Failure Team °Pager [phone] (M-F; 7a - 5p)  °Please contact [facility] for night-coverage after hours (5p -7a ) and weekends on [email or web address] ° °Agree with above.  ° °He remains intubated/sedated. Back on CVVHD. Sputum Cx + Abx restarted and Eraxis added. Now Afebrile.  ° °Back on NE.  In/out of AF ° °General:  Intubated/sedated °HEENT: normal °Neck: supple. RIJ HD cath  °Cor: PMI nondisplaced. Regular rate & rhythm. No rubs, gallops or murmurs. °Lungs: coarse °Abdomen: soft, nontender, nondistended. °Extremities: no cyanosis, clubbing, rash, 1+ edema °Neuro: intubated sedated ° °Remains extremely tenuous. Now back on broad-spectrum abx. PCT & WBC coming down.  ° °On CVVHD pulling -100 ° °In/out SR/AF. Would have low threshold to restart IV amio now that back on pressors.  ° °CRITICAL CARE °Performed by: Bensimhon, Daniel ° °Total critical care time: 35 minutes ° °Critical care time was exclusive of separately billable procedures and treating other patients. ° °Critical care was necessary to treat or prevent imminent or life-threatening deterioration. ° °Critical care was time spent personally by me (independent of midlevel providers or residents) on the following activities: development of treatment plan with patient and/or surrogate as well as nursing, discussions with consultants, evaluation of patient's response to treatment, examination of patient, obtaining history from patient or surrogate, ordering and performing treatments and interventions, ordering and review of laboratory studies, ordering and review of radiographic studies, pulse oximetry and re-evaluation of patient's condition. ° °Daniel Bensimhon, MD  °3:42 PM ° ° ° ° ° °

## 2021-11-10 ENCOUNTER — Inpatient Hospital Stay (HOSPITAL_COMMUNITY): Payer: Medicare HMO

## 2021-11-10 LAB — BLOOD CULTURE ID PANEL (REFLEXED) - BCID2
A.calcoaceticus-baumannii: NOT DETECTED
A.calcoaceticus-baumannii: NOT DETECTED
Bacteroides fragilis: NOT DETECTED
Bacteroides fragilis: NOT DETECTED
Candida albicans: NOT DETECTED
Candida albicans: NOT DETECTED
Candida auris: NOT DETECTED
Candida auris: NOT DETECTED
Candida glabrata: NOT DETECTED
Candida glabrata: NOT DETECTED
Candida krusei: NOT DETECTED
Candida krusei: NOT DETECTED
Candida parapsilosis: NOT DETECTED
Candida parapsilosis: NOT DETECTED
Candida tropicalis: NOT DETECTED
Candida tropicalis: NOT DETECTED
Cryptococcus neoformans/gattii: NOT DETECTED
Cryptococcus neoformans/gattii: NOT DETECTED
Enterobacter cloacae complex: NOT DETECTED
Enterobacter cloacae complex: NOT DETECTED
Enterobacterales: NOT DETECTED
Enterobacterales: NOT DETECTED
Enterococcus Faecium: NOT DETECTED
Enterococcus Faecium: NOT DETECTED
Enterococcus faecalis: NOT DETECTED
Enterococcus faecalis: NOT DETECTED
Escherichia coli: NOT DETECTED
Escherichia coli: NOT DETECTED
Haemophilus influenzae: NOT DETECTED
Haemophilus influenzae: NOT DETECTED
Klebsiella aerogenes: NOT DETECTED
Klebsiella aerogenes: NOT DETECTED
Klebsiella oxytoca: NOT DETECTED
Klebsiella oxytoca: NOT DETECTED
Klebsiella pneumoniae: NOT DETECTED
Klebsiella pneumoniae: NOT DETECTED
Listeria monocytogenes: NOT DETECTED
Listeria monocytogenes: NOT DETECTED
Methicillin resistance mecA/C: DETECTED — AB
Neisseria meningitidis: NOT DETECTED
Neisseria meningitidis: NOT DETECTED
Proteus species: NOT DETECTED
Proteus species: NOT DETECTED
Pseudomonas aeruginosa: NOT DETECTED
Pseudomonas aeruginosa: NOT DETECTED
Salmonella species: NOT DETECTED
Salmonella species: NOT DETECTED
Serratia marcescens: NOT DETECTED
Serratia marcescens: NOT DETECTED
Staphylococcus aureus (BCID): NOT DETECTED
Staphylococcus aureus (BCID): NOT DETECTED
Staphylococcus epidermidis: DETECTED — AB
Staphylococcus epidermidis: NOT DETECTED
Staphylococcus lugdunensis: NOT DETECTED
Staphylococcus lugdunensis: NOT DETECTED
Staphylococcus species: DETECTED — AB
Staphylococcus species: NOT DETECTED
Stenotrophomonas maltophilia: NOT DETECTED
Stenotrophomonas maltophilia: NOT DETECTED
Streptococcus agalactiae: NOT DETECTED
Streptococcus agalactiae: NOT DETECTED
Streptococcus pneumoniae: NOT DETECTED
Streptococcus pneumoniae: NOT DETECTED
Streptococcus pyogenes: NOT DETECTED
Streptococcus pyogenes: NOT DETECTED
Streptococcus species: NOT DETECTED
Streptococcus species: NOT DETECTED

## 2021-11-10 LAB — HEPARIN LEVEL (UNFRACTIONATED): Heparin Unfractionated: 0.31 IU/mL (ref 0.30–0.70)

## 2021-11-10 LAB — RENAL FUNCTION PANEL
Albumin: 1.6 g/dL — ABNORMAL LOW (ref 3.5–5.0)
Albumin: 1.6 g/dL — ABNORMAL LOW (ref 3.5–5.0)
Anion gap: 10 (ref 5–15)
Anion gap: 11 (ref 5–15)
BUN: 109 mg/dL — ABNORMAL HIGH (ref 8–23)
BUN: 102 mg/dL — ABNORMAL HIGH (ref 8–23)
CO2: 22 mmol/L (ref 22–32)
CO2: 22 mmol/L (ref 22–32)
Calcium: 7.6 mg/dL — ABNORMAL LOW (ref 8.9–10.3)
Calcium: 7.8 mg/dL — ABNORMAL LOW (ref 8.9–10.3)
Chloride: 102 mmol/L (ref 98–111)
Chloride: 101 mmol/L (ref 98–111)
Creatinine, Ser: 2.6 mg/dL — ABNORMAL HIGH (ref 0.61–1.24)
Creatinine, Ser: 2.25 mg/dL — ABNORMAL HIGH (ref 0.61–1.24)
GFR, Estimated: 26 mL/min — ABNORMAL LOW (ref >60)
GFR, Estimated: 31 mL/min — ABNORMAL LOW (ref >60)
Glucose, Bld: 160 mg/dL — ABNORMAL HIGH (ref 70–99)
Glucose, Bld: 223 mg/dL — ABNORMAL HIGH (ref 70–99)
Phosphorus: 2.9 mg/dL (ref 2.5–4.6)
Phosphorus: 3.2 mg/dL (ref 2.5–4.6)
Potassium: 5.3 mmol/L — ABNORMAL HIGH (ref 3.5–5.1)
Potassium: 4.7 mmol/L (ref 3.5–5.1)
Sodium: 134 mmol/L — ABNORMAL LOW (ref 135–145)
Sodium: 134 mmol/L — ABNORMAL LOW (ref 135–145)

## 2021-11-10 LAB — CBC
HCT: 21.6 % — ABNORMAL LOW (ref 39.0–52.0)
Hemoglobin: 7.5 g/dL — ABNORMAL LOW (ref 13.0–17.0)
MCH: 30.2 pg (ref 26.0–34.0)
MCHC: 34.7 g/dL (ref 30.0–36.0)
MCV: 87.1 fL (ref 80.0–100.0)
Platelets: 200 10*3/uL (ref 150–400)
RBC: 2.48 MIL/uL — ABNORMAL LOW (ref 4.22–5.81)
RDW: 16.8 % — ABNORMAL HIGH (ref 11.5–15.5)
WBC: 36.6 10*3/uL — ABNORMAL HIGH (ref 4.0–10.5)
nRBC: 0.6 % — ABNORMAL HIGH (ref 0.0–0.2)

## 2021-11-10 LAB — GLUCOSE, CAPILLARY
Glucose-Capillary: 129 mg/dL — ABNORMAL HIGH (ref 70–99)
Glucose-Capillary: 135 mg/dL — ABNORMAL HIGH (ref 70–99)
Glucose-Capillary: 156 mg/dL — ABNORMAL HIGH (ref 70–99)
Glucose-Capillary: 176 mg/dL — ABNORMAL HIGH (ref 70–99)
Glucose-Capillary: 196 mg/dL — ABNORMAL HIGH (ref 70–99)
Glucose-Capillary: 199 mg/dL — ABNORMAL HIGH (ref 70–99)

## 2021-11-10 LAB — MAGNESIUM: Magnesium: 2.6 mg/dL — ABNORMAL HIGH (ref 1.7–2.4)

## 2021-11-10 LAB — PROCALCITONIN: Procalcitonin: 22.89 ng/mL

## 2021-11-10 MED ORDER — DEXMEDETOMIDINE HCL IN NACL 400 MCG/100ML IV SOLN
0.4000 ug/kg/h | INTRAVENOUS | Status: DC
Start: 2021-11-10 — End: 2021-11-14
  Administered 2021-11-10: 0.2 ug/kg/h via INTRAVENOUS
  Administered 2021-11-10: 0.6 ug/kg/h via INTRAVENOUS
  Administered 2021-11-10: 0.5 ug/kg/h via INTRAVENOUS
  Administered 2021-11-11: 0.6 ug/kg/h via INTRAVENOUS
  Administered 2021-11-11: 0.7 ug/kg/h via INTRAVENOUS
  Administered 2021-11-12: 0.4 ug/kg/h via INTRAVENOUS
  Administered 2021-11-12 (×2): 1.2 ug/kg/h via INTRAVENOUS
  Administered 2021-11-12: 0.7 ug/kg/h via INTRAVENOUS
  Administered 2021-11-13: 08:00:00 1.22 ug/kg/h via INTRAVENOUS
  Administered 2021-11-13 – 2021-11-14 (×7): 1.2 ug/kg/h via INTRAVENOUS
  Filled 2021-11-10 (×17): qty 100

## 2021-11-10 MED ORDER — PRISMASOL BGK 0/2.5 32-2.5 MEQ/L EC SOLN
Status: DC
  Filled 2021-11-10 (×7): qty 5000

## 2021-11-10 MED ORDER — MIDODRINE HCL 5 MG PO TABS
10.0000 mg | ORAL_TABLET | Freq: Three times a day (TID) | ORAL | Status: DC
  Administered 2021-11-10 – 2021-11-14 (×12): 10 mg
  Filled 2021-11-10 (×12): qty 2

## 2021-11-10 MED ORDER — PRISMASOL BGK 0/2.5 32-2.5 MEQ/L EC SOLN
Status: DC
  Filled 2021-11-10 (×29): qty 5000

## 2021-11-10 NOTE — Progress Notes (Signed)
eLink Physician-Brief Progress Note Patient Name: Joseph [name] DOB: 1956/03/03 MRN: [id number]   Date of Service  11/10/2021  HPI/Events of Note  Patient with sub-optimal sedation on the ventilator.  eICU Interventions  Low dose Precedex gtt ordered.        Thomasene Lot Abbigayle Toole 11/10/2021, 1:33 AM

## 2021-11-10 NOTE — Progress Notes (Signed)
49mL Fentanyl wasted in the Stericycle, Francina Ames, RN to witness.

## 2021-11-10 NOTE — Progress Notes (Signed)
PHARMACY - PHYSICIAN COMMUNICATION CRITICAL VALUE ALERT - BLOOD CULTURE IDENTIFICATION (BCID)  Joseph [name] is an 65 y.o. male who presented to [facility] on 10/09/2021  Assessment:  69 yom presenting with sepsis due to PNA, fluA+, acute on chronic hypoxic respiratory failure, and AKI. 1 (of 2) BCx bottle growing staph epi with methicillin resistance. Other BCx bottle growing GNR (BCID did not detect anything). Patient continues on vancomycin/Zosyn/Eraxis for septic shock.  Name of physician (or Provider) Contacted: Ogan, O ([facility] CCM)  Current antibiotics: vancomycin, zosyn, Eraxis  Changes to prescribed antibiotics recommended:  No changes needed for now - should be covered with this regimen  if not contaminants  Results for orders placed or performed during the hospital encounter of 10/08/2021  Blood Culture ID Panel (Reflexed) (Collected: 11/09/2021 10:51 AM)  Result Value Ref Range   Enterococcus faecalis NOT DETECTED NOT DETECTED   Enterococcus Faecium NOT DETECTED NOT DETECTED   Listeria monocytogenes NOT DETECTED NOT DETECTED   Staphylococcus species DETECTED (A) NOT DETECTED   Staphylococcus aureus (BCID) NOT DETECTED NOT DETECTED   Staphylococcus epidermidis DETECTED (A) NOT DETECTED   Staphylococcus lugdunensis NOT DETECTED NOT DETECTED   Streptococcus species NOT DETECTED NOT DETECTED   Streptococcus agalactiae NOT DETECTED NOT DETECTED   Streptococcus pneumoniae NOT DETECTED NOT DETECTED   Streptococcus pyogenes NOT DETECTED NOT DETECTED   A.calcoaceticus-baumannii NOT DETECTED NOT DETECTED   Bacteroides fragilis NOT DETECTED NOT DETECTED   Enterobacterales NOT DETECTED NOT DETECTED   Enterobacter cloacae complex NOT DETECTED NOT DETECTED   Escherichia coli NOT DETECTED NOT DETECTED   Klebsiella aerogenes NOT DETECTED NOT DETECTED   Klebsiella oxytoca NOT DETECTED NOT DETECTED   Klebsiella pneumoniae NOT DETECTED NOT DETECTED   Proteus species NOT DETECTED NOT  DETECTED   Salmonella species NOT DETECTED NOT DETECTED   Serratia marcescens NOT DETECTED NOT DETECTED   Haemophilus influenzae NOT DETECTED NOT DETECTED   Neisseria meningitidis NOT DETECTED NOT DETECTED   Pseudomonas aeruginosa NOT DETECTED NOT DETECTED   Stenotrophomonas maltophilia NOT DETECTED NOT DETECTED   Candida albicans NOT DETECTED NOT DETECTED   Candida auris NOT DETECTED NOT DETECTED   Candida glabrata NOT DETECTED NOT DETECTED   Candida krusei NOT DETECTED NOT DETECTED   Candida parapsilosis NOT DETECTED NOT DETECTED   Candida tropicalis NOT DETECTED NOT DETECTED   Cryptococcus neoformans/gattii NOT DETECTED NOT DETECTED   Methicillin resistance mecA/C DETECTED (A) NOT DETECTED    Leia Alf, PharmD, BCPS Please check AMION for all [facility] contact numbers Clinical Pharmacist 11/10/2021 7:11 PM

## 2021-11-10 NOTE — Progress Notes (Signed)
[facility] NEPHROLOGY PROGRESS NOTE  Assessment/ Plan:  #Acute kidney injury on CKD IIIa: Likely 2/2 ATN due to septic shock.  Follows with [facility] as an outpatient.  Started CRRT on 12/5 after refractory to Lasix challenge.  Volume status and electrolytes significantly improved on CRRT.  CRRT briefly stopped but resumed 12/13 as patient had markedly elevated BUN. His BUN/sCr are down trending on CRRT, however he is requiring pressor support to maintain his blood pressures.  - We will continue CRRT, slowly remove fluid   #Acute hypoxic/hypercapnic respiratory failure/ARDS/influenza A: Currently on cefepime, vent per PCCM.  #Septic shock: On pressors now however in the setting of CRRT.  Marked leukocytosis mildly improved, there is no evidence of infection at the site of insertion of his catheter. Blood cultures x2 12/14 NG, resp Cx pending, currently on zosyn, vancomycin, and eraxis. ID following.   #Acute on chronic systolic heart failure due to ischemic cardiomyopathy: EF 35 to 40%.  Initially trialed on IV diuretics without response therefore volume optimized with CRRT.  Euvolemic now.  Cardiology is following.    #Hyponatremia, hypervolemic: Managed with CRRT.  Continue fluid restriction.  #Afib with RVR Continue amiodarone and heparin infusion    Subjective: Seen and examined in ICU.  On pressor support, now on levophed and vasopressin. Anuric.   Objective Vital signs in last 24 hours: Vitals:   11/10/21 0758 11/10/21 0800 11/10/21 0900 11/10/21 0932  BP: 115/74 115/74 135/77 (!) 152/85  Pulse: 98 98 98 (!) 116  Resp: '17 16 18 ' (!) 21  Temp: 97.8 F (36.6 C)     TempSrc: Axillary     SpO2: 98% 96% 97% 95%  Weight:      Height:       Weight change: 0.5 kg  Intake/Output Summary (Last 24 hours) at 11/10/2021 1054 Last data filed at 11/10/2021 1000 Gross per 24 hour  Intake 5220.44 ml  Output 3390 ml  Net 1830.44 ml        Labs: Basic Metabolic  Panel: Recent Labs  Lab 11/09/21 0409 11/09/21 1604 11/10/21 0332  NA 132* 137 134*  K 5.0 4.9 5.3*  CL 99 104 102  CO2 18* 18* 22  GLUCOSE 237* 155* 160*  BUN 164* 139* 109*  CREATININE 4.10* 3.22* 2.60*  CALCIUM 7.9* 8.0* 7.6*  PHOS 4.5 3.9 2.9    Liver Function Tests: Recent Labs  Lab 11/09/21 0409 11/09/21 1604 11/10/21 0332  ALBUMIN 1.7* 1.7* 1.6*    No results for input(s): LIPASE, AMYLASE in the last 168 hours. Recent Labs  Lab 11/08/21 1158  AMMONIA 53*    CBC: Recent Labs  Lab 11/04/21 0421 11/05/21 0314 11/06/21 0205 11/07/21 0256 11/08/21 0306 11/08/21 2008 11/09/21 0409 11/10/21 0332  WBC 35.3*   < > 49.4* 49.9* 44.4*  --  43.8* 36.6*  NEUTROABS 29.3*  --   --   --   --   --   --   --   HGB 11.3*   < > 10.3* 9.3* 8.6* 7.8* 8.5* 7.5*  HCT 34.4*   < > 31.6* 27.4* 25.7* 23.0* 24.8* 21.6*  MCV 88.0   < > 88.8 87.5 88.3  --  86.4 87.1  PLT 256   < > 209 194 192  --  247 200   < > = values in this interval not displayed.    Cardiac Enzymes: No results for input(s): CKTOTAL, CKMB, CKMBINDEX, TROPONINI in the last 168 hours. CBG: Recent Labs  Lab  11/09/21 1603 11/09/21 1914 11/09/21 2319 11/10/21 0325 11/10/21 0753  GLUCAP 130* 154* 170* 135* 129*     Iron Studies: No results for input(s): IRON, TIBC, TRANSFERRIN, FERRITIN in the last 72 hours. Studies/Results: DG Chest Port 1 View  Result Date: 11/10/2021 CLINICAL DATA:  Respiratory distress, history of "pneumonia" EXAM: PORTABLE CHEST 1 VIEW COMPARISON:  November 08, 2021. FINDINGS: Endotracheal tube terminates approximately 4 cm from the carina. Gastric tube courses through in off the field of the radiograph. RIGHT-sided dialysis catheter terminates in the area of the upper RIGHT atrium, partially obscured by overlying EKG leads which project over the patient's chest in various locations. LEFT-sided pacer defibrillator single lead remains in place. Cardiomediastinal contours are stable.  Persistent upper lobe RIGHT greater than LEFT airspace and interstitial process. Mild retrocardiac added density. Blunting of the LEFT costodiaphragmatic sulcus is subtle. No pneumothorax. On limited assessment there is no acute skeletal process. IMPRESSION: Persistent upper lobe RIGHT greater than LEFT airspace and interstitial process. Mild retrocardiac added density which may reflect atelectasis or developing airspace disease. Overall no substantial change from the previous radiograph. Oxygen tubing or delivery device projecting over the LEFT upper lobe scar. This area on the prior study. Electronically Signed   By: Zetta Bills M.D.   On: 11/10/2021 08:37    Medications: Infusions:   prismasol BGK 4/2.5 200 mL/hr at 11/09/21 1513   sodium chloride Stopped (11/07/21 0835)   sodium chloride Stopped (11/10/21 0651)   sodium chloride Stopped (11/09/21 2147)   amiodarone 30 mg/hr (11/10/21 1000)   anidulafungin     dexmedetomidine (PRECEDEX) IV infusion 0.6 mcg/kg/hr (11/10/21 1000)   feeding supplement (VITAL 1.5 CAL) 1,000 mL (11/09/21 2152)   heparin 900 Units/hr (11/10/21 1000)   norepinephrine (LEVOPHED) Adult infusion 30 mcg/min (11/10/21 1000)   piperacillin-tazobactam Stopped (11/10/21 0639)   prismasol BGK 2/2.5 dialysis solution     prismasol BGK 2/2.5 replacement solution     promethazine (PHENERGAN) injection (IM or IVPB)     vancomycin     vasopressin 0.03 Units/min (11/10/21 1000)    Scheduled Medications:  amiodarone  150 mg Intravenous Once   arformoterol  15 mcg Nebulization BID   vitamin C  250 mg Per Tube BID   aspirin  81 mg Per Tube Daily   atorvastatin  80 mg Per Tube QPM   B-complex with vitamin C  1 tablet Per Tube Daily   budesonide (PULMICORT) nebulizer solution  0.5 mg Nebulization BID   chlorhexidine gluconate (MEDLINE KIT)  15 mL Mouth Rinse BID   Chlorhexidine Gluconate Cloth  6 each Topical Daily   feeding supplement (PROSource TF)  90 mL Per Tube TID    insulin aspart  0-20 Units Subcutaneous Q4H   insulin aspart  10 Units Subcutaneous Q4H   insulin glargine-yfgn  35 Units Subcutaneous BID   lactulose  30 g Per Tube Daily   mouth rinse  15 mL Mouth Rinse 10 times per day   midodrine  10 mg Per Tube Q8H   nutrition supplement (JUVEN)  1 packet Per Tube BID BM   pantoprazole sodium  40 mg Per Tube Daily   revefenacin  175 mcg Nebulization Daily   sodium chloride flush  10-40 mL Intracatheter Q12H    have reviewed scheduled and prn medications.  Physical Exam: General: Critically ill looking male, sedated and intubated, NAD Heart: Tachycardic  Lungs: Coarse breath sounds bilaterally Abdomen:soft,  non-distended Extremities:No edema of BLE  Neuro: Opens eyes spontaneously,  not following commands  Dialysis Access: Temporary HD catheter in place.    Wana Mount 11/10/2021,10:54 AM  LOS: 17 days

## 2021-11-10 NOTE — Progress Notes (Signed)
ANTICOAGULATION CONSULT NOTE  Pharmacy Consult for Heparin Indication: atrial fibrillation   Allergies  Allergen Reactions   Entresto [Sacubitril-Valsartan]     Worsening renal function and weight gain    Patient Measurements: Height: 5\' 11"  (180.3 cm) Weight: 74.1 kg (163 lb 5.8 oz) IBW/kg (Calculated) : 75.3 Heparin Dosing Weight: 80.3 kg   Vital Signs: Temp: 98.5 F (36.9 C) (12/15 0327) Temp Source: Oral (12/15 0327) BP: 145/84 (12/15 0700) Pulse Rate: 108 (12/15 0700)  Labs: Recent Labs    11/08/21 0306 11/08/21 0813 11/08/21 2008 11/09/21 0409 11/09/21 1331 11/09/21 1604 11/10/21 0332  HGB 8.6*  --  7.8* 8.5*  --   --  7.5*  HCT 25.7*  --  23.0* 24.8*  --   --  21.6*  PLT 192  --   --  247  --   --  200  HEPARINUNFRC 0.53  --   --  0.94* 0.56  --  0.31  CREATININE 6.04*   < >  --  4.10*  --  3.22* 2.60*   < > = values in this interval not displayed.     Estimated Creatinine Clearance: 28.9 mL/min (A) (by C-G formula based on SCr of 2.6 mg/dL (H)).  Assessment: 65 y.o. male with Afib to continue on IV heparin.  Heparin level therapeutic and low normal; no bleeding per RN.  Hemoglobin/hematocrit drifting down; platelet count WNL.  Goal of Therapy:  Heparin level 0.3-0.7 units/ml Monitor platelets by anticoagulation protocol: Yes   Plan:  Increase heparin gtt slightly to 900 units/hr Daily heparin level and CBC Monitor closely for bleeding  Isidoro Santillana D. 01-[birth year]-1983, PharmD, BCPS, BCCCP 11/10/2021, 7:54 AM

## 2021-11-10 NOTE — Progress Notes (Signed)
NAME:  Joseph [name], MRN:  [id number], DOB:  [birth year], LOS: 17 ADMISSION DATE:  11/10/2021, CONSULTATION DATE:  10/28/2021 REFERRING MD:  Dr. Arbutus Leas, CHIEF COMPLAINT:  Acute hypoxic respiratory failure    History of Present Illness:  65 yo male presented to [facility] ER with respiratory distress and hypoxia.  Found to have COPD exacerbation from Influenza A pneumonia and acute on chronic systolic CHF with acute pulmonary edema.  Started on amiodarone for a fib with RVR.  Failed Bipap and required intubation.  Pertinent  Medical History  CHF, COPD, CAD s/p CABG, HTN, HLD, MRSA bacteremia, and prior GI bleed  Significant Hospital Events:  11/28 admitted at [facility] for acute hypoxic respiratory failure. CT chest 2021/11/10 >> patchy GGO in upper lobes, faint GGO in lower lobes, moderate effusions Echo 10/25/21 >> EF 35 to 40%, mod MR, severe LA dilation Renal u/s 10/29/21 >> medical renal disease 12/2 intubated and transferred [facility] care  10/31/2021 CVVHD initiated; cefepime started 12/7 off vaso; remains on levo 12/8 restart vaso 12/10 off CRRT 12/11 off pressors; Tm 101.2, increased WBC 12/12: - central line and arterial line d/c'ed 12/12 12/13: Doppler of arms b >> indeterminate SVT Rt cephalic vein Doppler of legs b/l 11/08/21 >> no DVT, PCT elevated. WBC still in 40s. PCT elevated.  12/14 restart pressors/amiodarone IV/ABx, repeat cultures  Interim History / Subjective:  Remains on pressors, IV amiodarone, sedation, heparin gtt, CRRT.  Objective   Blood pressure (!) 120/49, pulse 97, temperature 97.8 F (36.6 C), temperature source Axillary, resp. rate (!) 21, height 5\' 11"  (1.803 m), weight 74.1 kg, SpO2 98 %.    Vent Mode: PRVC FiO2 (%):  [30 %] 30 % Set Rate:  [18 bmp] 18 bmp Vt Set:  [600 mL] 600 mL PEEP:  [5 cmH20] 5 cmH20 Pressure Support:  [5 cmH20] 5 cmH20 Plateau Pressure:  [16 cmH20-22 cmH20] 19 cmH20   Intake/Output Summary (Last 24 hours) at 11/10/2021 0802 Last  data filed at 11/10/2021 0700 Gross per 24 hour  Intake 5042.37 ml  Output 3082 ml  Net 1960.37 ml   Filed Weights   11/08/21 0309 11/09/21 0500 11/10/21 0500  Weight: 75.5 kg 73.6 kg 74.1 kg    Examination:  General - sedated Eyes - pupils reactive ENT - ETT in place Cardiac - irregular, tachycardic Chest - equal breath sounds b/l, no wheezing or rales Abdomen - soft, non tender, + bowel sounds Extremities - no cyanosis, clubbing, or edema Skin - no rashes Neuro - RASS -2   Resolved Hospital Problem list   Elevated troponin from demand ischemia, Metabolic acidosis  Assessment & Plan:   Acute hypoxic/hypercapnic respiratory failure from Influenza A pneumonia with ARDS, acute pulmonary edema and COPD exacerbation. - ETT 12/02 >>  - pressure support as tolerated - will need to arrange for tracheostomy once infectious process improves - goal SpO2 > 90% - continue brovana, yupelri, pulmicort - prn albuterol - f/u CXR intermittently   New fever 12/11 with leukocytosis with septic shock. - central line and arterial line d/c'ed 12/12 - procalcitonin elevated - restarted ABx, antifungal therapy 12/14 - pressors to keep MAP > 65   Hypotension. - increase midodrine back to 10 mg q8h   A fib with RVR. Acute on chronic systolic CHF. Hx of HTN, HLD, CAD. - restarted amiodarone gtt 12/14 - continue heparin gtt, ASA, lipitor - hold outpt toprol, demadex, enalapril, aldactone - heart failure team following   AKI from  ATN 2nd to septic shock, hypoxia. CKD 3a. - restarted CRRT 12/13 - nephrology following   Leukocytosis. Anemia of critical illness. - f/u CBC   DM type 2 poorly controlled with hyperglycemia. - SSI - hold outpt jardiance   Acute metabolic encephalopathy from sepsis, hypoxia, and renal failure. - RASS goal 0 to -1   Superficial vein thrombosis of Rt cephalic vein. - already on anticoagulation - keep Rt arm elevated   Pressure injury. - Coccyx  stage 1, not present on admission   Best Practice (right click and "Reselect all SmartList Selections" daily)   Diet/type: tubefeeds DVT prophylaxis: systemic heparin GI prophylaxis: protonix Lines: d/c central line and arterial line 12/12; Right radial a line replaced 12/13 Foley:  N/A Code Status:  full code Family: x  Labs:   CMP Latest Ref Rng & Units 11/10/2021 11/09/2021 11/09/2021  Glucose 70 - 99 mg/dL 160(H) 155(H) 237(H)  BUN 8 - 23 mg/dL 109(H) 139(H) 164(H)  Creatinine 0.61 - 1.24 mg/dL 2.60(H) 3.22(H) 4.10(H)  Sodium 135 - 145 mmol/L 134(L) 137 132(L)  Potassium 3.5 - 5.1 mmol/L 5.3(H) 4.9 5.0  Chloride 98 - 111 mmol/L 102 104 99  CO2 22 - 32 mmol/L 22 18(L) 18(L)  Calcium 8.9 - 10.3 mg/dL 7.6(L) 8.0(L) 7.9(L)  Total Protein 6.5 - 8.1 g/dL - - -  Total Bilirubin 0.3 - 1.2 mg/dL - - -  Alkaline Phos 38 - 126 U/L - - -  AST 15 - 41 U/L - - -  ALT 0 - 44 U/L - - -    CBC Latest Ref Rng & Units 11/10/2021 11/09/2021 11/08/2021  WBC 4.0 - 10.5 K/uL 36.6(H) 43.8(H) -  Hemoglobin 13.0 - 17.0 g/dL 7.5(L) 8.5(L) 7.8(L)  Hematocrit 39.0 - 52.0 % 21.6(L) 24.8(L) 23.0(L)  Platelets 150 - 400 K/uL 200 247 -    ABG    Component Value Date/Time   PHART 7.381 11/08/2021 2008   PCO2ART 27.9 (L) 11/08/2021 2008   PO2ART 106 11/08/2021 2008   HCO3 16.6 (L) 11/08/2021 2008   TCO2 17 (L) 11/08/2021 2008   ACIDBASEDEF 8.0 (H) 11/08/2021 2008   O2SAT 98.0 11/08/2021 2008    CBG (last 3)  Recent Labs    11/09/21 2319 11/10/21 0325 11/10/21 0753  GLUCAP 170* 135* 129*   Critical care time: 33 minutes  Chesley Mires, MD [place] Pager - [phone] 11/10/2021, 8:15 AM

## 2021-11-10 NOTE — Consult Note (Signed)
WOC Nurse wound follow up Wound type:deep tissue injury to sacrum.  Remains intact and unchanged this week.   Measurement:4.5 cm x 4 cm intact maroon discoloration to sacrum and upper buttocks.  Left heel nonintact with 1 cm x 0.5 cm x 0.1 cm pink moist wound bed.  Foam dressings and prevalon boots in place.  Low air loss mattress in place.  Wound bed:see above Drainage (amount, consistency, odor) minimal weeping Periwound:frequently moist Dressing procedure/placement/frequency: Continue treatment as ordered  Check weekly. Will follow.  Maple Hudson MSN, RN, FNP-BC CWON Wound, Ostomy, Continence Nurse Pager [phone]

## 2021-11-10 NOTE — Progress Notes (Addendum)
Advanced Heart Failure Rounding Note  PCP-Cardiologist: Minus Breeding, MD   Subjective:    Remains intubated, FiO2 30%. Not following commands.   Remains on Vanc, Zosyn and Eraxis. AF. WBC 44>>43>>36K.   PCT trending back up 2.63>>26.99>>16.53>>22.89 today  Lactic acid 4.0>>2.7   Respiratory cultures + GN Rod + GPC. BCx growing GPC  WC following for sacral wound   Anuric. On CVVHD, running even.    Converted back to NSR w/ amio gtt, currently running at 30/hr    Objective:   Weight Range: 74.1 kg Body mass index is 22.78 kg/m.   Vital Signs:   Temp:  [97.7 F (36.5 C)-99.2 F (37.3 C)] 97.9 F (36.6 C) (12/15 1144) Pulse Rate:  [78-147] 78 (12/15 1100) Resp:  [13-29] 17 (12/15 1100) BP: (79-172)/(45-94) 142/64 (12/15 1100) SpO2:  [90 %-99 %] 99 % (12/15 1135) Arterial Line BP: (66-193)/(32-66) 104/40 (12/15 1100) FiO2 (%):  [30 %] 30 % (12/15 1135) Weight:  [74.1 kg] 74.1 kg (12/15 0500) Last BM Date: 11/09/21  Weight change: Filed Weights   11/08/21 0309 11/09/21 0500 11/10/21 0500  Weight: 75.5 kg 73.6 kg 74.1 kg    Intake/Output:   Intake/Output Summary (Last 24 hours) at 11/10/2021 1154 Last data filed at 11/10/2021 1100 Gross per 24 hour  Intake 5276.88 ml  Output 3390 ml  Net 1886.88 ml      Physical Exam   General:  ill appearing, intubated. No to following commands HEENT: normal + ETT Neck: supple. no JVD. + Rt IJ CVC Carotids 2+ bilat; no bruits. No lymphadenopathy or thyromegaly appreciated. Cor: PMI nondisplaced. Regular rate & rhythm. No rubs, gallops or murmurs. Lungs: intubated and clear Abdomen: soft, nontender, nondistended. No hepatosplenomegaly. No bruits or masses. Good bowel sounds. Extremities: no cyanosis, clubbing, rash, edema Neuro: intubated, not following commands.   Telemetry   NSR 80s  (personally reviewed)   Labs    CBC Recent Labs    11/09/21 0409 11/10/21 0332  WBC 43.8* 36.6*  HGB 8.5* 7.5*   HCT 24.8* 21.6*  MCV 86.4 87.1  PLT 247 092   Basic Metabolic Panel Recent Labs    11/09/21 1604 11/10/21 0332  NA 137 134*  K 4.9 5.3*  CL 104 102  CO2 18* 22  GLUCOSE 155* 160*  BUN 139* 109*  CREATININE 3.22* 2.60*  CALCIUM 8.0* 7.6*  MG 2.6* 2.6*  PHOS 3.9 2.9   Liver Function Tests Recent Labs    11/09/21 1604 11/10/21 0332  ALBUMIN 1.7* 1.6*   No results for input(s): LIPASE, AMYLASE in the last 72 hours. Cardiac Enzymes No results for input(s): CKTOTAL, CKMB, CKMBINDEX, TROPONINI in the last 72 hours.  BNP: BNP (last 3 results) Recent Labs    10/25/21 1945 10/28/21 1746 11/02/21 0300  BNP 383.0* 377.2* 129.5*    ProBNP (last 3 results) No results for input(s): PROBNP in the last 8760 hours.   D-Dimer No results for input(s): DDIMER in the last 72 hours.  Hemoglobin A1C No results for input(s): HGBA1C in the last 72 hours.  Fasting Lipid Panel No results for input(s): CHOL, HDL, LDLCALC, TRIG, CHOLHDL, LDLDIRECT in the last 72 hours.  Thyroid Function Tests No results for input(s): TSH, T4TOTAL, T3FREE, THYROIDAB in the last 72 hours.  Invalid input(s): FREET3  Other results:   Imaging    DG Chest Port 1 View  Result Date: 11/10/2021 CLINICAL DATA:  Respiratory distress, history of "pneumonia" EXAM: PORTABLE CHEST 1  VIEW COMPARISON:  November 08, 2021. FINDINGS: Endotracheal tube terminates approximately 4 cm from the carina. Gastric tube courses through in off the field of the radiograph. RIGHT-sided dialysis catheter terminates in the area of the upper RIGHT atrium, partially obscured by overlying EKG leads which project over the patient's chest in various locations. LEFT-sided pacer defibrillator single lead remains in place. Cardiomediastinal contours are stable. Persistent upper lobe RIGHT greater than LEFT airspace and interstitial process. Mild retrocardiac added density. Blunting of the LEFT costodiaphragmatic sulcus is subtle. No  pneumothorax. On limited assessment there is no acute skeletal process. IMPRESSION: Persistent upper lobe RIGHT greater than LEFT airspace and interstitial process. Mild retrocardiac added density which may reflect atelectasis or developing airspace disease. Overall no substantial change from the previous radiograph. Oxygen tubing or delivery device projecting over the LEFT upper lobe scar. This area on the prior study. Electronically Signed   By: Zetta Bills M.D.   On: 11/10/2021 08:37     Medications:     Scheduled Medications:  amiodarone  150 mg Intravenous Once   arformoterol  15 mcg Nebulization BID   vitamin C  250 mg Per Tube BID   aspirin  81 mg Per Tube Daily   atorvastatin  80 mg Per Tube QPM   B-complex with vitamin C  1 tablet Per Tube Daily   budesonide (PULMICORT) nebulizer solution  0.5 mg Nebulization BID   chlorhexidine gluconate (MEDLINE KIT)  15 mL Mouth Rinse BID   Chlorhexidine Gluconate Cloth  6 each Topical Daily   feeding supplement (PROSource TF)  90 mL Per Tube TID   insulin aspart  0-20 Units Subcutaneous Q4H   insulin aspart  10 Units Subcutaneous Q4H   insulin glargine-yfgn  35 Units Subcutaneous BID   lactulose  30 g Per Tube Daily   mouth rinse  15 mL Mouth Rinse 10 times per day   midodrine  10 mg Per Tube Q8H   nutrition supplement (JUVEN)  1 packet Per Tube BID BM   pantoprazole sodium  40 mg Per Tube Daily   revefenacin  175 mcg Nebulization Daily   sodium chloride flush  10-40 mL Intracatheter Q12H    Infusions:   prismasol BGK 4/2.5 200 mL/hr at 11/09/21 1513   sodium chloride Stopped (11/07/21 0835)   sodium chloride Stopped (11/10/21 0651)   sodium chloride Stopped (11/09/21 2147)   amiodarone 30 mg/hr (11/10/21 1114)   anidulafungin     dexmedetomidine (PRECEDEX) IV infusion 0.6 mcg/kg/hr (11/10/21 1152)   feeding supplement (VITAL 1.5 CAL) 1,000 mL (11/09/21 2152)   heparin 900 Units/hr (11/10/21 1100)   norepinephrine (LEVOPHED)  Adult infusion 35 mcg/min (11/10/21 1100)   piperacillin-tazobactam 3.375 g (11/10/21 1126)   prismasol BGK 2/2.5 dialysis solution 1,000 mL/hr at 11/10/21 1144   prismasol BGK 2/2.5 replacement solution 200 mL/hr at 11/10/21 1144   promethazine (PHENERGAN) injection (IM or IVPB)     vancomycin     vasopressin 0.03 Units/min (11/10/21 1100)    PRN Medications: Place/Maintain arterial line **AND** sodium chloride, sodium chloride, acetaminophen **OR** acetaminophen, albuterol, fentaNYL (SUBLIMAZE) injection, fentaNYL (SUBLIMAZE) injection, heparin, hydrALAZINE, labetalol, polyethylene glycol, polyvinyl alcohol, promethazine (PHENERGAN) injection (IM or IVPB), senna-docusate, sodium chloride, sodium chloride flush    Patient Profile   65 y/o male w/ CAD, chronic systolic heart failure and CKD IIIb, admitted w/ Influenza A, sepsis PNA, acute hypoxic respiratory failure and AKI on CKD.   Assessment/Plan   1. Septic/distributive shock - Completed cefepime for  PNA - Abx restarted 12/14, now on Vanc + Zosyn   - Bacteremia Blood Cx 15/14 w/ GPC  - WBC  50 > 44>43>36K  - Procal 5 > 3 > 2.6 > 27>17> 2 - Central line and arterial line out 12/12 - Empiric Eraxis added 12/14 - Back on NE for BP support - Lactic Acid 4.0>>2.7, follow trend  - Keep MAP > 70    2. Acute hypoxic respiratory failure - has PNA/ARDS with diffuse lung injury on CXR - continue vent support per CCM - developed pulmonary edema post volume resuscitation>>CVVHD for volume removal.  - Vent down to 30%. Will try to wean but may need trach.    3. Acute on chronic systolic HF due to iCM - EF stable 35-40% on echo - Volume overloaded in setting of fluid resuscitation for sepsis - poor response to high dose IV diuretics in setting of AKI 2/2 ATN  - CVVHD stopped 12/11. Still anuric. Now back on CRRT  - on NE for BP support  - Midodrine down to 5 mg TID - Off GDMT due to shock/AKI   4. PAF with RVR - currently NSR -  continue amio gtt 30/hr  - Continue heparin gtt. Hgb 11>10 > 9.3 > 8.6>8.5>7.5 today. Follow hgb closely. - Consider FOBT. RN has not noted melena  5. NSVT - continue amio gtt  - Keep K >4, mag >2   6. AKI on CKD 3b due to ATN/shock - baseline Scr 1.5-2.0. Peaked to 6.3 - renal u/s with medico-renal disease in R>L kidney - Now anuric. CVVHD initiated 12/5, off on 12/11. Restarted 12/13  - Support BP w/ NE + midodrine  - Keep MAP > 70  7. CAD with elevated trop due to demand ischemia - continue ASA/statin - doubt ischemia is a major issue here  - no b-blocker with shock  8. Superficial vein thrombosis Rt cephalic vein - On heparin gtt  9. Hyperkalemia - Received 10 g Lokelma 12/13 - K 5.3 today on recheck - CVVHD   10. Sacral Wound - WOC following   Length of Stay: [address], PA-C  11:54 AM   Advanced Heart Failure Team Pager [phone] (M-F; 7a - 5p)  Please contact [facility] for night-coverage after hours (5p -7a ) and weekends on [email or web address]  Agree with above.   Remains intubated/sedated. Still on high-dose NE. PCT coming down with broad-spectrum abx.   Converted AF -> NSR on IV amio Remains anuric on CVVHD.  General: Ill appearing. Sedated on vent HEENT: normal + ETT Neck: supple. RIJ HD cath  Carotids 2+ bilat; no bruits. No lymphadenopathy or thryomegaly appreciated. Cor: PMI nondisplaced. Regular rate & rhythm. No rubs, gallops or murmurs. Lungs: coarse Abdomen: soft, nontender, nondistended. No hepatosplenomegaly. No bruits or masses. Good bowel sounds. Extremities: no cyanosis, clubbing, rash, 1+ edema Neuro: intubated sedated   Remains extremely tenuous. On high-dose NE. PCT now coming down. Back in NSR. Would continue IV amio until NE off. Continue heparin. Suspect he may be headed toward trach,   CRITICAL CARE Performed by: Glori Bickers  Total critical care time: 35 minutes  Critical care time was exclusive of separately  billable procedures and treating other patients.  Critical care was necessary to treat or prevent imminent or life-threatening deterioration.  Critical care was time spent personally by me (independent of midlevel providers or residents) on the following activities: development of treatment plan with patient and/or surrogate as well as nursing, discussions with  consultants, evaluation of patient's response to treatment, examination of patient, obtaining history from patient or surrogate, ordering and performing treatments and interventions, ordering and review of laboratory studies, ordering and review of radiographic studies, pulse oximetry and re-evaluation of patient's condition.  Glori Bickers, MD  3:01 PM

## 2021-11-11 LAB — CULTURE, RESPIRATORY W GRAM STAIN

## 2021-11-11 LAB — CBC
HCT: 19.7 % — ABNORMAL LOW (ref 39.0–52.0)
HCT: 22.9 % — ABNORMAL LOW (ref 39.0–52.0)
Hemoglobin: 6.4 g/dL — CL (ref 13.0–17.0)
Hemoglobin: 7.7 g/dL — ABNORMAL LOW (ref 13.0–17.0)
MCH: 29.1 pg (ref 26.0–34.0)
MCH: 30.1 pg (ref 26.0–34.0)
MCHC: 32.5 g/dL (ref 30.0–36.0)
MCHC: 33.6 g/dL (ref 30.0–36.0)
MCV: 89.5 fL (ref 80.0–100.0)
MCV: 89.5 fL (ref 80.0–100.0)
Platelets: 152 10*3/uL (ref 150–400)
Platelets: 133 10*3/uL — ABNORMAL LOW (ref 150–400)
RBC: 2.2 MIL/uL — ABNORMAL LOW (ref 4.22–5.81)
RBC: 2.56 MIL/uL — ABNORMAL LOW (ref 4.22–5.81)
RDW: 17.9 % — ABNORMAL HIGH (ref 11.5–15.5)
RDW: 17.8 % — ABNORMAL HIGH (ref 11.5–15.5)
WBC: 36 10*3/uL — ABNORMAL HIGH (ref 4.0–10.5)
WBC: 41.6 10*3/uL — ABNORMAL HIGH (ref 4.0–10.5)
nRBC: 1.9 % — ABNORMAL HIGH (ref 0.0–0.2)
nRBC: 3.3 % — ABNORMAL HIGH (ref 0.0–0.2)

## 2021-11-11 LAB — RENAL FUNCTION PANEL
Albumin: <1.5 g/dL — ABNORMAL LOW (ref 3.5–5.0)
Albumin: <1.5 g/dL — ABNORMAL LOW (ref 3.5–5.0)
Anion gap: 10 (ref 5–15)
Anion gap: 13 (ref 5–15)
BUN: 87 mg/dL — ABNORMAL HIGH (ref 8–23)
BUN: 89 mg/dL — ABNORMAL HIGH (ref 8–23)
CO2: 23 mmol/L (ref 22–32)
CO2: 20 mmol/L — ABNORMAL LOW (ref 22–32)
Calcium: 7.8 mg/dL — ABNORMAL LOW (ref 8.9–10.3)
Calcium: 7.7 mg/dL — ABNORMAL LOW (ref 8.9–10.3)
Chloride: 100 mmol/L (ref 98–111)
Chloride: 98 mmol/L (ref 98–111)
Creatinine, Ser: 2.17 mg/dL — ABNORMAL HIGH (ref 0.61–1.24)
Creatinine, Ser: 2.06 mg/dL — ABNORMAL HIGH (ref 0.61–1.24)
GFR, Estimated: 33 mL/min — ABNORMAL LOW (ref >60)
GFR, Estimated: 35 mL/min — ABNORMAL LOW (ref >60)
Glucose, Bld: 136 mg/dL — ABNORMAL HIGH (ref 70–99)
Glucose, Bld: 259 mg/dL — ABNORMAL HIGH (ref 70–99)
Phosphorus: 2.9 mg/dL (ref 2.5–4.6)
Phosphorus: 3.2 mg/dL (ref 2.5–4.6)
Potassium: 4.6 mmol/L (ref 3.5–5.1)
Potassium: 4.2 mmol/L (ref 3.5–5.1)
Sodium: 133 mmol/L — ABNORMAL LOW (ref 135–145)
Sodium: 131 mmol/L — ABNORMAL LOW (ref 135–145)

## 2021-11-11 LAB — GLUCOSE, CAPILLARY
Glucose-Capillary: 131 mg/dL — ABNORMAL HIGH (ref 70–99)
Glucose-Capillary: 103 mg/dL — ABNORMAL HIGH (ref 70–99)
Glucose-Capillary: 134 mg/dL — ABNORMAL HIGH (ref 70–99)
Glucose-Capillary: 265 mg/dL — ABNORMAL HIGH (ref 70–99)
Glucose-Capillary: 137 mg/dL — ABNORMAL HIGH (ref 70–99)
Glucose-Capillary: 199 mg/dL — ABNORMAL HIGH (ref 70–99)

## 2021-11-11 LAB — LACTIC ACID, PLASMA: Lactic Acid, Venous: 1.5 mmol/L (ref 0.5–1.9)

## 2021-11-11 LAB — CULTURE, BLOOD (ROUTINE X 2)

## 2021-11-11 LAB — APTT: aPTT: 58 seconds — ABNORMAL HIGH (ref 24–36)

## 2021-11-11 LAB — PREPARE RBC (CROSSMATCH)

## 2021-11-11 LAB — HEPARIN LEVEL (UNFRACTIONATED)
Heparin Unfractionated: 0.25 IU/mL — ABNORMAL LOW (ref 0.30–0.70)
Heparin Unfractionated: 0.34 IU/mL (ref 0.30–0.70)

## 2021-11-11 LAB — MAGNESIUM: Magnesium: 2.6 mg/dL — ABNORMAL HIGH (ref 1.7–2.4)

## 2021-11-11 MED ORDER — SODIUM CHLORIDE 0.9% IV SOLUTION: Freq: Once | INTRAVENOUS | Status: DC

## 2021-11-11 MED ORDER — DEXTROSE 5 % IV SOLN
400.0000 mg | Freq: Three times a day (TID) | INTRAVENOUS | Status: DC
  Administered 2021-11-11 – 2021-11-14 (×9): 400 mg via INTRAVENOUS
  Filled 2021-11-11 (×11): qty 25

## 2021-11-11 MED ORDER — SODIUM CHLORIDE 0.9 % IV SOLN
2.0000 g | Freq: Two times a day (BID) | INTRAVENOUS | Status: DC
  Administered 2021-11-11 – 2021-11-12 (×3): 2 g via INTRAVENOUS
  Filled 2021-11-11 (×3): qty 2

## 2021-11-11 NOTE — Progress Notes (Signed)
[name] NEPHROLOGY PROGRESS NOTE  Assessment/ Plan:  #Acute kidney injury on CKD IIIa: Likely 2/2 ATN due to septic shock.  Follows with [facility] as an outpatient.  Started CRRT on 12/5 after refractory to Lasix challenge.  Volume status and electrolytes significantly improved on CRRT.  CRRT briefly stopped but resumed 12/13 as patient had markedly elevated BUN. His BUN/sCr are down trending on CRRT, He continues to require pressor support to maintain his blood pressures. Remains anuric.  - We will continue CRRT, slowly remove fluid (~net neg 50-100cc/h)  #Acute hypoxic/hypercapnic respiratory failure/ARDS/influenza A: Currently on cefepime and vancomycin vent per PCCM.  #Septic shock: On pressors now however in the setting of CRRT.  Marked leukocytosis mildly improved, there is no evidence of infection at the site of insertion of his catheter. Blood cultures x2 12/14 MRS epi and GNR speciation pending, resp Cx with burkholderia cepacia, currently on vancomycin, cefepime. ID following.   #Acute on chronic systolic heart failure due to ischemic cardiomyopathy: EF 35 to 40%.  Initially trialed on IV diuretics without response therefore volume optimized with CRRT.  Euvolemic now.  Cardiology is following.    #Hyponatremia, hypervolemic: Managed with CRRT.  Continue fluid restriction.  #Afib with RVR Continue amiodarone and heparin infusion    Subjective: Seen and examined in ICU.  On pressor support, now on levophed and vasopressin. Anuric.   Objective Vital signs in last 24 hours: Vitals:   11/11/21 1045 11/11/21 1053 11/11/21 1100 11/11/21 1115  BP: (!) 145/66 (!) 145/66 (!) 132/57 (!) 87/51  Pulse: 96 92 92 81  Resp: (!) 22 (!) '21 20 17  ' Temp:      TempSrc:      SpO2: 96% 95% 98% 95%  Weight:      Height:       Weight change: 1.7 kg  Intake/Output Summary (Last 24 hours) at 11/11/2021 1143 Last data filed at 11/11/2021 1100 Gross per 24 hour  Intake 4055.78 ml   Output 4227 ml  Net -171.22 ml        Labs: Basic Metabolic Panel: Recent Labs  Lab 11/10/21 0332 11/10/21 1623 11/11/21 0414  NA 134* 134* 133*  K 5.3* 4.7 4.6  CL 102 101 100  CO2 '22 22 23  ' GLUCOSE 160* 223* 136*  BUN 109* 102* 87*  CREATININE 2.60* 2.25* 2.17*  CALCIUM 7.6* 7.8* 7.8*  PHOS 2.9 3.2 2.9    Liver Function Tests: Recent Labs  Lab 11/10/21 0332 11/10/21 1623 11/11/21 0414  ALBUMIN 1.6* 1.6* <1.5*    No results for input(s): LIPASE, AMYLASE in the last 168 hours. Recent Labs  Lab 11/08/21 1158  AMMONIA 53*    CBC: Recent Labs  Lab 11/07/21 0256 11/08/21 0306 11/08/21 2008 11/09/21 0409 11/10/21 0332 11/11/21 0414  WBC 49.9* 44.4*  --  43.8* 36.6* 36.0*  HGB 9.3* 8.6*   < > 8.5* 7.5* 6.4*  HCT 27.4* 25.7*   < > 24.8* 21.6* 19.7*  MCV 87.5 88.3  --  86.4 87.1 89.5  PLT 194 192  --  247 200 152   < > = values in this interval not displayed.    Cardiac Enzymes: No results for input(s): CKTOTAL, CKMB, CKMBINDEX, TROPONINI in the last 168 hours. CBG: Recent Labs  Lab 11/10/21 1551 11/10/21 1914 11/10/21 2345 11/11/21 0315 11/11/21 0750  GLUCAP 196* 176* 156* 131* 103*     Iron Studies: No results for input(s): IRON, TIBC, TRANSFERRIN, FERRITIN in the last 72 hours.  Studies/Results: DG Chest Port 1 View  Result Date: 11/10/2021 CLINICAL DATA:  Respiratory distress, history of "pneumonia" EXAM: PORTABLE CHEST 1 VIEW COMPARISON:  November 08, 2021. FINDINGS: Endotracheal tube terminates approximately 4 cm from the carina. Gastric tube courses through in off the field of the radiograph. RIGHT-sided dialysis catheter terminates in the area of the upper RIGHT atrium, partially obscured by overlying EKG leads which project over the patient's chest in various locations. LEFT-sided pacer defibrillator single lead remains in place. Cardiomediastinal contours are stable. Persistent upper lobe RIGHT greater than LEFT airspace and  interstitial process. Mild retrocardiac added density. Blunting of the LEFT costodiaphragmatic sulcus is subtle. No pneumothorax. On limited assessment there is no acute skeletal process. IMPRESSION: Persistent upper lobe RIGHT greater than LEFT airspace and interstitial process. Mild retrocardiac added density which may reflect atelectasis or developing airspace disease. Overall no substantial change from the previous radiograph. Oxygen tubing or delivery device projecting over the LEFT upper lobe scar. This area on the prior study. Electronically Signed   By: Zetta Bills M.D.   On: 11/10/2021 08:37    Medications: Infusions:   prismasol BGK 4/2.5 200 mL/hr at 11/10/21 1612   sodium chloride Stopped (11/07/21 0835)   sodium chloride Stopped (11/10/21 0651)   sodium chloride Stopped (11/09/21 2147)   amiodarone 30 mg/hr (11/11/21 1100)   ceFEPime (MAXIPIME) IV Stopped (11/11/21 0954)   dexmedetomidine (PRECEDEX) IV infusion 1 mcg/kg/hr (11/11/21 1100)   feeding supplement (VITAL 1.5 CAL) 1,000 mL (11/10/21 2123)   heparin 1,000 Units/hr (11/11/21 1100)   norepinephrine (LEVOPHED) Adult infusion 5 mcg/min (11/11/21 1100)   prismasol BGK 2/2.5 dialysis solution 1,000 mL/hr at 11/11/21 0900   prismasol BGK 2/2.5 replacement solution 200 mL/hr at 11/10/21 1144   promethazine (PHENERGAN) injection (IM or IVPB)     vancomycin Stopped (11/10/21 1300)   vasopressin 0.03 Units/min (11/11/21 1100)    Scheduled Medications:  sodium chloride   Intravenous Once   amiodarone  150 mg Intravenous Once   arformoterol  15 mcg Nebulization BID   vitamin C  250 mg Per Tube BID   aspirin  81 mg Per Tube Daily   atorvastatin  80 mg Per Tube QPM   B-complex with vitamin C  1 tablet Per Tube Daily   budesonide (PULMICORT) nebulizer solution  0.5 mg Nebulization BID   chlorhexidine gluconate (MEDLINE KIT)  15 mL Mouth Rinse BID   Chlorhexidine Gluconate Cloth  6 each Topical Daily   feeding supplement  (PROSource TF)  90 mL Per Tube TID   insulin aspart  0-20 Units Subcutaneous Q4H   insulin aspart  10 Units Subcutaneous Q4H   insulin glargine-yfgn  35 Units Subcutaneous BID   lactulose  30 g Per Tube Daily   mouth rinse  15 mL Mouth Rinse 10 times per day   midodrine  10 mg Per Tube Q8H   nutrition supplement (JUVEN)  1 packet Per Tube BID BM   pantoprazole sodium  40 mg Per Tube Daily   revefenacin  175 mcg Nebulization Daily   sodium chloride flush  10-40 mL Intracatheter Q12H    have reviewed scheduled and prn medications.  Physical Exam: General: Critically ill looking male, sedated and intubated, NAD Heart: RRR Lungs: Coarse breath sounds bilaterally, no wheezing Abdomen:soft,  non-distended Extremities:No edema of BLE  Neuro: Opens eyes spontaneously, not following commands  Dialysis Access: Temporary HD catheter in place.    Joseph [name] 11/11/2021,11:43 AM  LOS: 18 days

## 2021-11-11 NOTE — Progress Notes (Signed)
Advanced Heart Failure Rounding Note  PCP-Cardiologist: Minus Breeding, MD   Subjective:    Remains intubated, FiO2 30%.On PS.  Not following commands.  Remains on Vanc, Zosyn and Eraxis. AF. WBC going back up    BCx 1/2 staph epi (?contaminant) Resp cx: Abundant burkholderia cepacia   Pressor requirements decreasing. NE 9. VP 0.03  Remains on CVVHD   Remains in NSR on IV amio. Tolerating heparin   Albumin < 1.5   Objective:   Weight Range: 75.8 kg Body mass index is 23.31 kg/m.   Vital Signs:   Temp:  [97.8 F (36.6 C)-99 F (37.2 C)] 98 F (36.7 C) (12/16 1500) Pulse Rate:  [60-109] 60 (12/16 1900) Resp:  [13-29] 20 (12/16 1900) BP: (87-156)/(37-81) 109/54 (12/16 1900) SpO2:  [90 %-100 %] 94 % (12/16 1900) Arterial Line BP: (73-177)/(30-52) 98/31 (12/16 1900) FiO2 (%):  [30 %] 30 % (12/16 1520) Weight:  [75.8 kg] 75.8 kg (12/16 0500) Last BM Date: 11/11/21  Weight change: Filed Weights   11/09/21 0500 11/10/21 0500 11/11/21 0500  Weight: 73.6 kg 74.1 kg 75.8 kg    Intake/Output:   Intake/Output Summary (Last 24 hours) at 11/11/2021 1952 Last data filed at 11/11/2021 1900 Gross per 24 hour  Intake 4215.1 ml  Output 4345 ml  Net -129.9 ml       Physical Exam   General:  Ill appearing on vent. Not following commands HEENT: normal + ETT dysconjugate gaze Neck: supple. RIJ HD cath  Cor: PMI nondisplaced. Regular rate & rhythm. No rubs, gallops or murmurs. Lungs: coarse Abdomen: soft, nontender, nondistended. No hepatosplenomegaly. No bruits or masses. Good bowel sounds. Extremities: no cyanosis, clubbing, rash, 1+ edema Neuro: intubated/sedated   Telemetry   NSR 60-70s  (personally reviewed)   Labs    CBC Recent Labs    11/11/21 0414 11/11/21 1755  WBC 36.0* 41.6*  HGB 6.4* 7.7*  HCT 19.7* 22.9*  MCV 89.5 89.5  PLT 152 133*    Basic Metabolic Panel Recent Labs    11/10/21 0332 11/10/21 1623 11/11/21 0414 11/11/21 1542   NA 134*   < > 133* 131*  K 5.3*   < > 4.6 4.2  CL 102   < > 100 98  CO2 22   < > 23 20*  GLUCOSE 160*   < > 136* 259*  BUN 109*   < > 87* 89*  CREATININE 2.60*   < > 2.17* 2.06*  CALCIUM 7.6*   < > 7.8* 7.7*  MG 2.6*  --  2.6*  --   PHOS 2.9   < > 2.9 3.2   < > = values in this interval not displayed.    Liver Function Tests Recent Labs    11/11/21 0414 11/11/21 1542  ALBUMIN <1.5* <1.5*    No results for input(s): LIPASE, AMYLASE in the last 72 hours. Cardiac Enzymes No results for input(s): CKTOTAL, CKMB, CKMBINDEX, TROPONINI in the last 72 hours.  BNP: BNP (last 3 results) Recent Labs    10/25/21 1945 10/28/21 1746 11/02/21 0300  BNP 383.0* 377.2* 129.5*     ProBNP (last 3 results) No results for input(s): PROBNP in the last 8760 hours.   D-Dimer No results for input(s): DDIMER in the last 72 hours.  Hemoglobin A1C No results for input(s): HGBA1C in the last 72 hours.  Fasting Lipid Panel No results for input(s): CHOL, HDL, LDLCALC, TRIG, CHOLHDL, LDLDIRECT in the last 72 hours.  Thyroid Function Tests  No results for input(s): TSH, T4TOTAL, T3FREE, THYROIDAB in the last 72 hours.  Invalid input(s): FREET3  Other results:   Imaging    No results found.   Medications:     Scheduled Medications:  sodium chloride   Intravenous Once   amiodarone  150 mg Intravenous Once   arformoterol  15 mcg Nebulization BID   vitamin C  250 mg Per Tube BID   aspirin  81 mg Per Tube Daily   atorvastatin  80 mg Per Tube QPM   B-complex with vitamin C  1 tablet Per Tube Daily   budesonide (PULMICORT) nebulizer solution  0.5 mg Nebulization BID   chlorhexidine gluconate (MEDLINE KIT)  15 mL Mouth Rinse BID   Chlorhexidine Gluconate Cloth  6 each Topical Daily   feeding supplement (PROSource TF)  90 mL Per Tube TID   insulin aspart  0-20 Units Subcutaneous Q4H   insulin aspart  10 Units Subcutaneous Q4H   insulin glargine-yfgn  35 Units Subcutaneous BID    lactulose  30 g Per Tube Daily   mouth rinse  15 mL Mouth Rinse 10 times per day   midodrine  10 mg Per Tube Q8H   nutrition supplement (JUVEN)  1 packet Per Tube BID BM   pantoprazole sodium  40 mg Per Tube Daily   revefenacin  175 mcg Nebulization Daily   sodium chloride flush  10-40 mL Intracatheter Q12H    Infusions:   prismasol BGK 4/2.5 200 mL/hr at 11/11/21 1752   sodium chloride Stopped (11/07/21 0835)   sodium chloride Stopped (11/10/21 0651)   sodium chloride Stopped (11/09/21 2147)   amiodarone 30 mg/hr (11/11/21 1900)   ceFEPime (MAXIPIME) IV Stopped (11/11/21 0954)   dexmedetomidine (PRECEDEX) IV infusion 0.7 mcg/kg/hr (11/11/21 1900)   feeding supplement (VITAL 1.5 CAL) 1,000 mL (11/11/21 1809)   heparin 1,000 Units/hr (11/11/21 1900)   norepinephrine (LEVOPHED) Adult infusion 9 mcg/min (11/11/21 1900)   prismasol BGK 2/2.5 dialysis solution 1,000 mL/hr at 11/11/21 1803   prismasol BGK 2/2.5 replacement solution 200 mL/hr at 11/11/21 1335   promethazine (PHENERGAN) injection (IM or IVPB)     sulfamethoxazole-trimethoprim Stopped (11/11/21 1529)   vancomycin 150 mL/hr at 11/11/21 1300   vasopressin 0.03 Units/min (11/11/21 1900)    PRN Medications: Place/Maintain arterial line **AND** sodium chloride, sodium chloride, acetaminophen **OR** acetaminophen, albuterol, fentaNYL (SUBLIMAZE) injection, fentaNYL (SUBLIMAZE) injection, heparin, hydrALAZINE, labetalol, polyethylene glycol, polyvinyl alcohol, promethazine (PHENERGAN) injection (IM or IVPB), senna-docusate, sodium chloride, sodium chloride flush    Patient Profile   65 y/o male w/ CAD, chronic systolic heart failure and CKD IIIb, admitted w/ Influenza A, sepsis PNA, acute hypoxic respiratory failure and AKI on CKD.   Assessment/Plan   1. Septic/distributive shock - Completed cefepime for PNA - Abx restarted 12/14, now on Vanc + cefepime + Eraxis   - Bcx 1/2 staph epi (?contaminant) - Sputum cx burkholderia  cepacia - Pressor requirements improving - Co-ox has been stable  2. Acute hypoxic respiratory failure - has PNA/ARDS with diffuse lung injury on CXR - continue vent support per CCM - developed pulmonary edema post volume resuscitation>>CVVHD for volume removal.  - Vent down to 30%. On PS support. Attempting to wean. May need trach   3. Acute on chronic systolic HF due to iCM - EF stable 35-40% on echo - CVVHD stopped 12/11. Still anuric. Now back on CRRT  - on NE for support  co-ox has been stable - Off GDMT due to shock/AKI  4. PAF with RVR - AF recurred when IV amio stopped - now back in NSR on iv amio - continue amio gtt 30/hr until pressors off - Continue heparin gtt. - Follow hgb (has been drifting down)  5. NSVT - continue amio gtt  - Keep K >4, mag >2   6. AKI on CKD 3b due to ATN/shock - baseline Scr 1.5-2.0. Peaked to 6.3 - renal u/s with medico-renal disease in R>L kidney - Now anuric. CVVHD initiated 12/5, off on 12/11. Restarted 12/13  - Support BP w/ NE and VP - Keep MAP > 70  7. CAD with elevated trop due to demand ischemia - continue ASA/statin - doubt ischemia is a major issue here  - no b-blocker with shock  8. Superficial vein thrombosis Rt cephalic vein - On heparin gtt  9. Hyperkalemia - Remains on CVVHD  10. Sacral Wound - WOC following   CRITICAL CARE Performed by: Glori Bickers  Total critical care time: 35 minutes  Critical care time was exclusive of separately billable procedures and treating other patients.  Critical care was necessary to treat or prevent imminent or life-threatening deterioration.  Critical care was time spent personally by me (independent of midlevel providers or residents) on the following activities: development of treatment plan with patient and/or surrogate as well as nursing, discussions with consultants, evaluation of patient's response to treatment, examination of patient, obtaining history from patient  or surrogate, ordering and performing treatments and interventions, ordering and review of laboratory studies, ordering and review of radiographic studies, pulse oximetry and re-evaluation of patient's condition.   The AHF team will see again on Monday. Please page [facility] over the weekend with questions.   Length of Stay: 6  Glori Bickers, MD  7:52 PM   Advanced Heart Failure Team Pager [phone] (M-F; 7a - 5p)  Please contact [facility] for night-coverage after hours (5p -7a ) and weekends on [email or web address]

## 2021-11-11 NOTE — Progress Notes (Addendum)
Pharmacy Antibiotic Note  Joseph [name] is a 65 y.o. male admitted on 10/21/2021 with acute respiratory failure secondary to Flu and COPD exacerbation.  S/p a course of Tamiflu.  Initially started on Rocephin and doxycycline for CAP, then broadened to cefepime for 10 days when patient transferred into the ICU.  He has persistent fever and leukocytosis.  Pharmacy has been consulted for vancomycin and Zosyn dosing for PNA.  He is also starting empiric Eraxis.    CRRT resumed, now hypothermic on CRRT, WBC down 36, LA wnl. PCT 22.   Addendum: Trach aspirate back with Burkholderia cepacia with significant resistance but sensitive to Bactrim. Will adjust gram negative coverage to Bactrim at this time.   Plan: D/C Cefepime Start IV Bactrim 5mg /kg (400mg )  IV every 8 hours due to CRRT and bactermia.  Continue Vanc 750mg  IV Q24H - Will check vanc levels at [facility]  D/C Eraxis per discussion with Dr. .  Monitor CRRT tolerance/interruption, clinical progress, vanc level as indicated  Height: 5\' 11"  (180.3 cm) Weight: 75.8 kg (167 lb 1.7 oz) IBW/kg (Calculated) : 75.3  Temp (24hrs), Avg:98.2 F (36.8 C), Min:97.6 F (36.4 C), Max:99 F (37.2 C)  Recent Labs  Lab 11/07/21 0256 11/08/21 0306 11/08/21 0813 11/09/21 0409 11/09/21 1055 11/09/21 1330 11/09/21 1604 11/10/21 0332 11/10/21 1623 11/11/21 0414  WBC 49.9* 44.4*  --  43.8*  --   --   --  36.6*  --  36.0*  CREATININE 3.54* 6.04*   < > 4.10*  --   --  3.22* 2.60* 2.25* 2.17*  LATICACIDVEN  --   --   --   --  4.0* 2.7*  --   --   --  1.5   < > = values in this interval not displayed.    Estimated Creatinine Clearance: 35.2 mL/min (A) (by C-G formula based on SCr of 2.17 mg/dL (H)).    Allergies  Allergen Reactions   Entresto [Sacubitril-Valsartan]     Worsening renal function and weight gain    Doxy 11/29 >> 12/2 CTX 11/29 >> 12/2 Vanc 12/2 >> 12/3, restart 12/14 >> Zosyn 12/14 >> 12/16 Eraxis 12/14 >>12/16 Cefepime  12/2 >> 12/11; 12/16 x1 Tamiflu 11/28 >>12/3 Bactrim 12/16 >>   11/28 BCx - negative 11/28 Flu A - positive 11/29 MRSA PCR - negative 12/2 TA - negative 12/6 TA - C. tropicalis 12/14 MRSA PCR -  12/14 BCx -  12/14 TA -   14/6, PharmD, BCPS, BCCCP Clinical Pharmacist Please refer to [facility] for [facility] numbers 11/11/2021, 8:49 AM

## 2021-11-11 NOTE — Progress Notes (Signed)
ANTICOAGULATION CONSULT NOTE  Pharmacy Consult for Heparin Indication: atrial fibrillation   Allergies  Allergen Reactions   Entresto [Sacubitril-Valsartan]     Worsening renal function and weight gain    Patient Measurements: Height: 5\' 11"  (180.3 cm) Weight: 75.8 kg (167 lb 1.7 oz) IBW/kg (Calculated) : 75.3 Heparin Dosing Weight: 80.3 kg   Vital Signs: Temp: 98.4 F (36.9 C) (12/16 0825) Temp Source: Oral (12/16 0825) BP: 129/60 (12/16 0915) Pulse Rate: 81 (12/16 0915)  Labs: Recent Labs    11/09/21 0409 11/09/21 1331 11/09/21 1604 11/10/21 0332 11/10/21 1623 11/11/21 0414 11/11/21 0745  HGB 8.5*  --   --  7.5*  --  6.4*  --   HCT 24.8*  --   --  21.6*  --  19.7*  --   PLT 247  --   --  200  --  152  --   APTT  --   --   --   --   --   --  58*  HEPARINUNFRC 0.94* 0.56  --  0.31  --  0.25*  --   CREATININE 4.10*  --    < > 2.60* 2.25* 2.17*  --    < > = values in this interval not displayed.    Estimated Creatinine Clearance: 35.2 mL/min (A) (by C-G formula based on SCr of 2.17 mg/dL (H)).  Assessment: 65 y.o. male with Afib to continue on IV heparin.    Heparin level drifted down to 0.25. Hgb down at 6.4 today but no overt bleeding noted. aPTT 58 which correlates with Heparin level. Platelets down some today at 152 - will watch.   Goal of Therapy:  Heparin level 0.3-0.7 units/ml Monitor platelets by anticoagulation protocol: Yes   Plan:  Increase heparin gtt to 1000 units/hr Recheck heparin level in 8 hours Daily heparin level and CBC Monitor closely for bleeding  79, PharmD, BCPS, BCCCP Clinical Pharmacist Please refer to [facility] for [facility] numbers 11/11/2021, 9:37 AM

## 2021-11-11 NOTE — Progress Notes (Signed)
NAME:  Joseph [name], MRN:  [id number], DOB:  20-Jan-1956, LOS: 18 ADMISSION DATE:  2021-11-10, CONSULTATION DATE:  10/28/2021 REFERRING MD:  Dr. Arbutus Leas, CHIEF COMPLAINT:  Acute hypoxic respiratory failure    History of Present Illness:  65 yo male presented to [facility] ER with respiratory distress and hypoxia.  Found to have COPD exacerbation from Influenza A pneumonia and acute on chronic systolic CHF with acute pulmonary edema.  Started on amiodarone for a fib with RVR.  Failed Bipap and required intubation.  Pertinent  Medical History  CHF, COPD, CAD s/p CABG, HTN, HLD, MRSA bacteremia, and prior GI bleed  Significant Hospital Events:  11/28 admitted at [facility] for acute hypoxic respiratory failure. CT chest November 10, 2021 >> patchy GGO in upper lobes, faint GGO in lower lobes, moderate effusions Echo 10/25/21 >> EF 35 to 40%, mod MR, severe LA dilation Renal u/s 10/29/21 >> medical renal disease 12/2 intubated and [facility]  10/31/2021 CVVHD initiated; cefepime started 12/7 off vaso; remains on levo 12/8 restart vaso 12/10 off CRRT 12/11 off pressors; Tm 101.2, increased WBC 12/12: - central line and arterial line d/c'ed 12/12 12/13: Doppler of arms b >> indeterminate SVT Rt cephalic vein Doppler of legs b/l 11/08/21 >> no DVT, PCT elevated. WBC still in 40s. PCT elevated.  12/14 restart pressors/amiodarone IV/ABx, repeat cultures  Interim History / Subjective:  Remains on pressors, IV amiodarone, sedation, heparin gtt, CRRT.  Objective   Blood pressure (!) 115/55, pulse 97, temperature 98.4 F (36.9 C), temperature source Oral, resp. rate 19, height 5\' 11"  (1.803 m), weight 75.8 kg, SpO2 95 %.    Vent Mode: CPAP;PSV FiO2 (%):  [30 %] 30 % Set Rate:  [18 bmp] 18 bmp Vt Set:  [600 mL] 600 mL PEEP:  [5 cmH20] 5 cmH20 Pressure Support:  [8 cmH20] 8 cmH20 Plateau Pressure:  [10 cmH20-21 cmH20] 16 cmH20   Intake/Output Summary (Last 24 hours) at 11/11/2021 0954 Last data  filed at 11/11/2021 0900 Gross per 24 hour  Intake 3794.86 ml  Output 4041 ml  Net -246.14 ml    Filed Weights   11/09/21 0500 11/10/21 0500 11/11/21 0500  Weight: 73.6 kg 74.1 kg 75.8 kg    Examination:  General - sedated Eyes - pupils reactive ENT - ETT in place Cardiac - irregular, tachycardic Chest - equal breath sounds b/l, no wheezing or rales Abdomen - soft, non tender, + bowel sounds Extremities - no cyanosis, clubbing, or edema Skin - no rashes Neuro - eyes open, semipurposeful, does not follow commands   Resolved Hospital Problem list   Elevated troponin from demand ischemia, Metabolic acidosis  Assessment & Plan:   Acute hypoxic/hypercapnic respiratory failure from Influenza A pneumonia with ARDS, acute pulmonary edema and COPD exacerbation. - ETT 12/02 >>  - pressure support as tolerated - consider trial of extubation, getting reasonable Vt on 8/5, encephalopathy precludes - goal SpO2 > 90% - continue brovana, yupelri, pulmicort - prn albuterol - f/u CXR intermittently   New fever 12/11 with leukocytosis with septic shock. - central line and arterial line d/c'ed 12/12 - procalcitonin elevated - Eraxis 12/14 to 12/16 (no yeast or fungus on culture), Vancomycin 12.14 -- (MRS epi 1 of 2). Zosyn 12/14-12/16, cefepime 12/16 -- (GNR no speciation yet) - pressors to keep MAP > 65   Hypotension. - midodrine 10 mg q8h   A fib with RVR. Acute on chronic systolic CHF. Hx of HTN, HLD, CAD. - amiodarone  gtt 12/14 - continue heparin gtt, ASA, lipitor - hold outpt toprol, demadex, enalapril, aldactone - heart failure team following   AKI from ATN 2nd to septic shock, hypoxia. CKD 3a. - restarted CRRT 12/13 - nephrology following   Leukocytosis. Anemia of critical illness. - Hgb dropping, suspect losses from CRRT, phlebotomy, no signs of active bleeding   DM type 2 poorly controlled with hyperglycemia. - SSI - hold outpt jardiance   Acute metabolic  encephalopathy from sepsis, hypoxia, and renal failure. - RASS goal 0 to -1   Superficial vein thrombosis of Rt cephalic vein. - already on anticoagulation - keep Rt arm elevated   Pressure injury. - Coccyx stage 1, not present on admission   Best Practice (right click and "Reselect all SmartList Selections" daily)   Diet/type: tubefeeds DVT prophylaxis: systemic heparin GI prophylaxis: protonix Lines: d/c central line and arterial line 12/12; Right radial a line replaced 12/13, HD line remains in place Foley:  N/A Code Status:  full code Family: x  Labs:   CMP Latest Ref Rng & Units 11/11/2021 11/10/2021 11/10/2021  Glucose 70 - 99 mg/dL 136(H) 223(H) 160(H)  BUN 8 - 23 mg/dL 87(H) 102(H) 109(H)  Creatinine 0.61 - 1.24 mg/dL 2.17(H) 2.25(H) 2.60(H)  Sodium 135 - 145 mmol/L 133(L) 134(L) 134(L)  Potassium 3.5 - 5.1 mmol/L 4.6 4.7 5.3(H)  Chloride 98 - 111 mmol/L 100 101 102  CO2 22 - 32 mmol/L 23 22 22   Calcium 8.9 - 10.3 mg/dL 7.8(L) 7.8(L) 7.6(L)  Total Protein 6.5 - 8.1 g/dL - - -  Total Bilirubin 0.3 - 1.2 mg/dL - - -  Alkaline Phos 38 - 126 U/L - - -  AST 15 - 41 U/L - - -  ALT 0 - 44 U/L - - -    CBC Latest Ref Rng & Units 11/11/2021 11/10/2021 11/09/2021  WBC 4.0 - 10.5 K/uL 36.0(H) 36.6(H) 43.8(H)  Hemoglobin 13.0 - 17.0 g/dL 6.4(LL) 7.5(L) 8.5(L)  Hematocrit 39.0 - 52.0 % 19.7(L) 21.6(L) 24.8(L)  Platelets 150 - 400 K/uL 152 200 247    ABG    Component Value Date/Time   PHART 7.381 11/08/2021 2008   PCO2ART 27.9 (L) 11/08/2021 2008   PO2ART 106 11/08/2021 2008   HCO3 16.6 (L) 11/08/2021 2008   TCO2 17 (L) 11/08/2021 2008   ACIDBASEDEF 8.0 (H) 11/08/2021 2008   O2SAT 98.0 11/08/2021 2008    CBG (last 3)  Recent Labs    11/10/21 2345 11/11/21 0315 11/11/21 0750  GLUCAP 156* 131* 103*    Critical care time:    CRITICAL CARE Performed by: Bonna Gains Muhammad Vacca   Total critical care time: 40 minutes  Critical care time was exclusive of  separately billable procedures and treating other patients.  Critical care was necessary to treat or prevent imminent or life-threatening deterioration.  Critical care was time spent personally by me on the following activities: development of treatment plan with patient and/or surrogate as well as nursing, discussions with consultants, evaluation of patient's response to treatment, examination of patient, obtaining history from patient or surrogate, ordering and performing treatments and interventions, ordering and review of laboratory studies, ordering and review of radiographic studies, pulse oximetry and re-evaluation of patient's condition.  Lanier Clam, MD [place] for contact info 11/11/2021, 9:54 AM

## 2021-11-11 NOTE — Progress Notes (Signed)
eLink Physician-Brief Progress Note Patient Name: Joseph [name] DOB: March 18, 1956 MRN: [id number]   Date of Service  11/11/2021  HPI/Events of Note  Hemoglobin 6.4 gm / dl. No evidence of active bleeding.  eICU Interventions  One unit PRBC ordered transfused.        Thomasene Lot Kekoa Fyock 11/11/2021, 4:58 AM

## 2021-11-11 NOTE — Progress Notes (Signed)
ANTICOAGULATION CONSULT NOTE  Pharmacy Consult for Heparin Indication: atrial fibrillation   Allergies  Allergen Reactions   Entresto [Sacubitril-Valsartan]     Worsening renal function and weight gain    Patient Measurements: Height: 5\' 11"  (180.3 cm) Weight: 75.8 kg (167 lb 1.7 oz) IBW/kg (Calculated) : 75.3 Heparin Dosing Weight: 80.3 kg   Vital Signs: Temp: 98 F (36.7 C) (12/16 1500) Temp Source: Oral (12/16 1500) BP: 125/57 (12/16 1800) Pulse Rate: 73 (12/16 1800)  Labs: Recent Labs    11/10/21 0332 11/10/21 1623 11/11/21 0414 11/11/21 0745 11/11/21 1542 11/11/21 1755  HGB 7.5*  --  6.4*  --   --  7.7*  HCT 21.6*  --  19.7*  --   --  22.9*  PLT 200  --  152  --   --  133*  APTT  --   --   --  58*  --   --   HEPARINUNFRC 0.31  --  0.25*  --   --  0.34  CREATININE 2.60* 2.25* 2.17*  --  2.06*  --      Estimated Creatinine Clearance: 37.1 mL/min (A) (by C-G formula based on SCr of 2.06 mg/dL (H)).  Assessment: 65 y.o. male with Afib to continue on IV heparin.    Heparin level therapeutic at 0.34. Hgb low but stable, pltc down further to 133k.  Goal of Therapy:  Heparin level 0.3-0.7 units/ml Monitor platelets by anticoagulation protocol: Yes   Plan:  Continue heparin 1000 units/h Daily heparin level and CBC  79, PharmD, [place], [facility] Clinical Pharmacist [phone] Please check AMION for all [facility] Pharmacy numbers 11/11/2021

## 2021-11-12 LAB — RENAL FUNCTION PANEL
Albumin: 1.6 g/dL — ABNORMAL LOW (ref 3.5–5.0)
Albumin: 1.6 g/dL — ABNORMAL LOW (ref 3.5–5.0)
Anion gap: 10 (ref 5–15)
Anion gap: 16 — ABNORMAL HIGH (ref 5–15)
BUN: 78 mg/dL — ABNORMAL HIGH (ref 8–23)
BUN: 81 mg/dL — ABNORMAL HIGH (ref 8–23)
CO2: 21 mmol/L — ABNORMAL LOW (ref 22–32)
CO2: 18 mmol/L — ABNORMAL LOW (ref 22–32)
Calcium: 7.6 mg/dL — ABNORMAL LOW (ref 8.9–10.3)
Calcium: 8.2 mg/dL — ABNORMAL LOW (ref 8.9–10.3)
Chloride: 97 mmol/L — ABNORMAL LOW (ref 98–111)
Chloride: 95 mmol/L — ABNORMAL LOW (ref 98–111)
Creatinine, Ser: 2.01 mg/dL — ABNORMAL HIGH (ref 0.61–1.24)
Creatinine, Ser: 2.14 mg/dL — ABNORMAL HIGH (ref 0.61–1.24)
GFR, Estimated: 36 mL/min — ABNORMAL LOW (ref >60)
GFR, Estimated: 33 mL/min — ABNORMAL LOW (ref >60)
Glucose, Bld: 210 mg/dL — ABNORMAL HIGH (ref 70–99)
Glucose, Bld: 247 mg/dL — ABNORMAL HIGH (ref 70–99)
Phosphorus: 4.4 mg/dL (ref 2.5–4.6)
Phosphorus: 4.5 mg/dL (ref 2.5–4.6)
Potassium: 4.2 mmol/L (ref 3.5–5.1)
Potassium: 4.3 mmol/L (ref 3.5–5.1)
Sodium: 128 mmol/L — ABNORMAL LOW (ref 135–145)
Sodium: 129 mmol/L — ABNORMAL LOW (ref 135–145)

## 2021-11-12 LAB — CBC
HCT: 24.4 % — ABNORMAL LOW (ref 39.0–52.0)
Hemoglobin: 8.3 g/dL — ABNORMAL LOW (ref 13.0–17.0)
MCH: 30.5 pg (ref 26.0–34.0)
MCHC: 34 g/dL (ref 30.0–36.0)
MCV: 89.7 fL (ref 80.0–100.0)
Platelets: 168 10*3/uL (ref 150–400)
RBC: 2.72 MIL/uL — ABNORMAL LOW (ref 4.22–5.81)
RDW: 18.5 % — ABNORMAL HIGH (ref 11.5–15.5)
WBC: 46.2 10*3/uL — ABNORMAL HIGH (ref 4.0–10.5)
nRBC: 3.6 % — ABNORMAL HIGH (ref 0.0–0.2)

## 2021-11-12 LAB — HEPARIN LEVEL (UNFRACTIONATED): Heparin Unfractionated: 0.38 IU/mL (ref 0.30–0.70)

## 2021-11-12 LAB — GLUCOSE, CAPILLARY
Glucose-Capillary: 198 mg/dL — ABNORMAL HIGH (ref 70–99)
Glucose-Capillary: 216 mg/dL — ABNORMAL HIGH (ref 70–99)
Glucose-Capillary: 153 mg/dL — ABNORMAL HIGH (ref 70–99)
Glucose-Capillary: 232 mg/dL — ABNORMAL HIGH (ref 70–99)
Glucose-Capillary: 178 mg/dL — ABNORMAL HIGH (ref 70–99)
Glucose-Capillary: 289 mg/dL — ABNORMAL HIGH (ref 70–99)

## 2021-11-12 LAB — BPAM RBC
Blood Product Expiration Date: 202212232359
ISSUE DATE / TIME: 202212160623
Unit Type and Rh: 6200

## 2021-11-12 LAB — TYPE AND SCREEN
ABO/RH(D): A POS
Antibody Screen: NEGATIVE
Unit division: 0

## 2021-11-12 LAB — CULTURE, BLOOD (ROUTINE X 2)

## 2021-11-12 LAB — MAGNESIUM: Magnesium: 2.7 mg/dL — ABNORMAL HIGH (ref 1.7–2.4)

## 2021-11-12 LAB — VANCOMYCIN, TROUGH: Vancomycin Tr: 17 ug/mL (ref 15–20)

## 2021-11-12 MED ORDER — INSULIN ASPART 100 UNIT/ML IJ SOLN
12.0000 [IU] | INTRAMUSCULAR | Status: DC
  Administered 2021-11-12 – 2021-11-14 (×11): 12 [IU] via SUBCUTANEOUS

## 2021-11-12 MED ORDER — MIDAZOLAM HCL 2 MG/2ML IJ SOLN
1.0000 mg | Freq: Once | INTRAMUSCULAR | Status: AC
  Administered 2021-11-12: 2 mg via INTRAVENOUS

## 2021-11-12 MED ORDER — OXYCODONE HCL 5 MG PO TABS
5.0000 mg | ORAL_TABLET | Freq: Four times a day (QID) | ORAL | Status: DC
  Administered 2021-11-12 – 2021-11-14 (×8): 5 mg
  Filled 2021-11-12 (×8): qty 1

## 2021-11-12 MED ORDER — MIDAZOLAM HCL 2 MG/2ML IJ SOLN
INTRAMUSCULAR | Status: AC
  Filled 2021-11-12: qty 2

## 2021-11-12 MED ORDER — INSULIN GLARGINE-YFGN 100 UNIT/ML ~~LOC~~ SOLN
40.0000 [IU] | Freq: Two times a day (BID) | SUBCUTANEOUS | Status: DC
  Administered 2021-11-12 – 2021-11-14 (×4): 40 [IU] via SUBCUTANEOUS
  Filled 2021-11-12 (×5): qty 0.4

## 2021-11-12 NOTE — Progress Notes (Signed)
NAME:  Joseph [name], MRN:  [id number], DOB:  Jul 05, 1956, LOS: 40 ADMISSION DATE:  10/10/2021, CONSULTATION DATE:  10/28/2021 REFERRING MD:  Dr. Carles Collet, CHIEF COMPLAINT:  Acute hypoxic respiratory failure    History of Present Illness:  65 yo male presented to [facility] ER with respiratory distress and hypoxia.  Found to have COPD exacerbation from Influenza A pneumonia and acute on chronic systolic CHF with acute pulmonary edema.  Started on amiodarone for a fib with RVR.  Failed Bipap and required intubation.  Pertinent  Medical History  CHF, COPD, CAD s/p CABG, HTN, HLD, MRSA bacteremia, and prior GI bleed  Significant Hospital Events:  11/28 admitted at [facility] for acute hypoxic respiratory failure. CT chest 10/23/2021 >> patchy GGO in upper lobes, faint GGO in lower lobes, moderate effusions Echo 10/25/21 >> EF 35 to 40%, mod MR, severe LA dilation Renal u/s 10/29/21 >> medical renal disease 12/2 intubated and transferred [facility] care  10/31/2021 CVVHD initiated; cefepime started 12/7 off vaso; remains on levo 12/8 restart vaso 12/10 off CRRT 12/11 off pressors; Tm 101.2, increased WBC 12/12: - central line and arterial line d/c'ed 12/12 12/13: Doppler of arms b >> indeterminate SVT Rt cephalic vein Doppler of legs b/l 11/08/21 >> no DVT, PCT elevated. WBC still in 2s. PCT elevated.  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 WBC going up but just started bactrim for Burkholderia 12/16  Objective   Blood pressure (!) 158/76, pulse 84, temperature 98 F (36.7 C), temperature source Oral, resp. rate 17, height 5\' 11"  (1.803 m), weight 74.8 kg, SpO2 98 %.    Vent Mode: PSV;CPAP FiO2 (%):  [30 %-40 %] 40 % Set Rate:  [18 bmp] 18 bmp Vt Set:  [600 mL] 600 mL PEEP:  [5 cmH20] 5 cmH20 Pressure Support:  [5 cmH20-8  cmH20] 8 cmH20 Plateau Pressure:  [16 cmH20] 16 cmH20   Intake/Output Summary (Last 24 hours) at 11/12/2021 [id number] Last data filed at 11/12/2021 [id number] Gross per 24 hour  Intake 5069.74 ml  Output 6449 ml  Net -1379.26 ml    Filed Weights   11/10/21 0500 11/11/21 0500 11/12/21 0500  Weight: 74.1 kg 75.8 kg 74.8 kg    Examination:  General - eyes open, panicked appearing Eyes - pupils reactive ENT - ETT in place Cardiac - irregular, tachycardic Chest - equal breath sounds b/l, no wheezing or rales Abdomen - soft, non tender, + bowel sounds Extremities - no cyanosis, clubbing, or edema Skin - no rashes Neuro - eyes open, follows commands   Resolved Hospital Problem list   Elevated troponin from demand ischemia, Metabolic acidosis  Assessment & Plan:   Acute hypoxic/hypercapnic respiratory failure from Influenza A pneumonia with ARDS, acute pulmonary edema and COPD exacerbation. Burkholderia Pna (HAP) - ETT 12/02 >>  - pressure support as tolerated - consider trial of extubation in coming days, getting reasonable Vt on 8/5 but on 5/5 struggles, encephalopathy precludes extubation as well - goal SpO2 > 90% - continue brovana, yupelri, pulmicort - prn albuterol --abx as below   New fever 12/11 with leukocytosis with septic shock, Burkholderia on sputum culture . - central line and arterial line d/c'ed 12/12 - procalcitonin elevated - Eraxis 12/14 to 12/16 (no yeast or fungus on culture), Vancomycin 12.14 -- (MRS epi 1 of 2). Zosyn 12/14-12/16, cefepime 12/16 >> (GNR  on blood culture no speciation), Bactrim 12/16 >> (Burkholderia sputum) - pressors to keep MAP > 65   Hypotension. - midodrine 10 mg q8h   A fib with RVR. Acute on chronic systolic CHF. Hx of HTN, HLD, CAD. - amiodarone gtt 12/14 - continue heparin gtt, ASA, lipitor - hold outpt toprol, demadex, enalapril, aldactone - heart failure team following   AKI from [facility] 2nd to septic shock, hypoxia. CKD 3a. -  restarted CRRT 12/13 - nephrology following   Leukocytosis. Anemia of critical illness. - Hgb dropping, suspect losses from CRRT, phlebotomy, no signs of active bleeding   DM type 2 poorly controlled with hyperglycemia. - increasing basal and TF coverage 12/17 - hold outpt jardiance   Acute metabolic encephalopathy from sepsis, hypoxia, and renal failure. - RASS goal 0 to -1   Superficial vein thrombosis of Rt cephalic vein. - already on anticoagulation - keep Rt arm elevated   Pressure injury. - Coccyx stage 1, not present on admission   Best Practice (right click and "Reselect all SmartList Selections" daily)   Diet/type: tubefeeds DVT prophylaxis: systemic heparin GI prophylaxis: protonix Lines: d/c central line and arterial line 12/12; Right radial a line replaced 12/13, HD line remains in place Foley:  N/A Code Status:  full code Family: x  Labs:   CMP Latest Ref Rng & Units 11/12/2021 11/11/2021 11/11/2021  Glucose 70 - 99 mg/dL 998(P) 382(N) 053(Z)  BUN 8 - 23 mg/dL 76(B) 34(L) 93(X)  Creatinine 0.61 - 1.24 mg/dL 9.02(I) 0.97(D) 5.32(D)  Sodium 135 - 145 mmol/L 128(L) 131(L) 133(L)  Potassium 3.5 - 5.1 mmol/L 4.2 4.2 4.6  Chloride 98 - 111 mmol/L 97(L) 98 100  CO2 22 - 32 mmol/L 21(L) 20(L) 23  Calcium 8.9 - 10.3 mg/dL 7.6(L) 7.7(L) 7.8(L)  Total Protein 6.5 - 8.1 g/dL - - -  Total Bilirubin 0.3 - 1.2 mg/dL - - -  Alkaline Phos 38 - 126 U/L - - -  AST 15 - 41 U/L - - -  ALT 0 - 44 U/L - - -    CBC Latest Ref Rng & Units 11/12/2021 11/11/2021 11/11/2021  WBC 4.0 - 10.5 K/uL 46.2(H) 41.6(H) 36.0(H)  Hemoglobin 13.0 - 17.0 g/dL 8.3(L) 7.7(L) 6.4(LL)  Hematocrit 39.0 - 52.0 % 24.4(L) 22.9(L) 19.7(L)  Platelets 150 - 400 K/uL 168 133(L) 152    ABG    Component Value Date/Time   PHART 7.381 11/08/2021 2008   PCO2ART 27.9 (L) 11/08/2021 2008   PO2ART 106 11/08/2021 2008   HCO3 16.6 (L) 11/08/2021 2008   TCO2 17 (L) 11/08/2021 2008   ACIDBASEDEF 8.0 (H)  11/08/2021 2008   O2SAT 98.0 11/08/2021 2008    CBG (last 3)  Recent Labs    11/11/21 2306 11/12/21 0306 11/12/21 0705  GLUCAP 199* 198* 216*    Critical care time:    CRITICAL CARE Performed by: Karren Burly   Total critical care time: 38 minutes  Critical care time was exclusive of separately billable procedures and treating other patients.  Critical care was necessary to treat or prevent imminent or life-threatening deterioration.  Critical care was time spent personally by me on the following activities: development of treatment plan with patient and/or surrogate as well as nursing, discussions with consultants, evaluation of patient's response to treatment, examination of patient, obtaining history from patient or surrogate, ordering and performing treatments and interventions, ordering and review of laboratory studies, ordering and review of radiographic studies, pulse oximetry and re-evaluation of  patient's condition.  Lanier Clam, MD [place] for contact info 11/12/2021, 9:17 AM

## 2021-11-12 NOTE — Progress Notes (Signed)
[facility] ROUNDING NOTE   Subjective:   Interval History: This is a 65 year old gentleman with septic shock probably secondary to pneumonia and acute hypoxic respiratory failure.  He has a complicating history of congestive heart failure with ejection fraction of 35 to 40%.  He has required CVVHD since 11/06/2021.  His hospital course has been complicated by atrial fibrillation with rapid ventricular rate as well as NSVT treated with amiodarone drip.  Baseline creatinine appears to be about 1.5 to 2 mg/dL.  Blood pressure 150/80 pulse 100 temperature 98 O2 sats 95% FiO2 40%  Sodium 128 potassium 4.2 chloride 97 CO2 21 BUN 78 creatinine 2 glucose 210 calcium 7.6 phosphorus 4.4 magnesium 2.7 hemoglobin 8.3 WBC 46.2 platelets 168  Objective:  Vital signs in last 24 hours:  Temp:  [97.2 F (36.2 C)-98.6 F (37 C)] 98 F (36.7 C) (12/17 0705) Pulse Rate:  [60-124] 110 (12/17 1000) Resp:  [14-27] 26 (12/17 1000) BP: (87-176)/(49-86) 152/81 (12/17 1000) SpO2:  [89 %-100 %] 94 % (12/17 1000) Arterial Line BP: (73-178)/(28-63) 145/51 (12/17 1000) FiO2 (%):  [30 %-40 %] 40 % (12/17 0927) Weight:  [74.8 kg] 74.8 kg (12/17 0500)  Weight change: -1 kg Filed Weights   11/10/21 0500 11/11/21 0500 11/12/21 0500  Weight: 74.1 kg 75.8 kg 74.8 kg    Intake/Output: I/O last 3 completed shifts: In: 6722.4 [I.V.:2103.8; Blood:317.1; Other:75; NG/GT:2230; IV Piggyback:1996.6] Out: 0737 [TGGYI:9485; Stool:400]   Intake/Output this shift:  Total I/O In: 630.3 [I.V.:205.3; NG/GT:325; IV Piggyback:100] Out: 887 [Other:837; Stool:50]  General: Critically ill looking male, sedated and intubated, NAD Heart: RRR Lungs: Coarse breath sounds bilaterally, no wheezing Abdomen:soft,  non-distended Extremities:No edema of BLE  Neuro: Opens eyes spontaneously, not following commands  Dialysis Access: Temporary HD catheter in place.   Basic Metabolic Panel: Recent Labs  Lab  11/08/21 0306 11/08/21 0813 11/09/21 1604 11/10/21 0332 11/10/21 1623 11/11/21 0414 11/11/21 1542 11/12/21 0354  NA 131*   < > 137 134* 134* 133* 131* 128*  K 5.9*   < > 4.9 5.3* 4.7 4.6 4.2 4.2  CL 96*   < > 104 102 101 100 98 97*  CO2 14*   < > 18* _0 20* 21*  GLUCOSE 221*   < > 155* 160* 223* 136* 259* 210*  BUN 210*   < > 139* 109* 102* 87* 89* 78*  CREATININE 6.04*   < > 3.22* 2.60* 2.25* 2.17* 2.06* 2.01*  CALCIUM 8.2*   < > 8.0* 7.6* 7.8* 7.8* 7.7* 7.6*  MG 3.3*  --  2.6* 2.6*  --  2.6*  --  2.7*  PHOS 6.1*   < > 3.9 2.9 3.2 2.9 3.2 4.4   < > = values in this interval not displayed.    Liver Function Tests: Recent Labs  Lab 11/10/21 0332 11/10/21 1623 11/11/21 0414 11/11/21 1542 11/12/21 0354  ALBUMIN 1.6* 1.6* <1.5* <1.5* 1.6*   No results for input(s): LIPASE, AMYLASE in the last 168 hours. Recent Labs  Lab 11/08/21 1158  AMMONIA 53*    CBC: Recent Labs  Lab 11/09/21 0409 11/10/21 0332 11/11/21 0414 11/11/21 1755 11/12/21 0354  WBC 43.8* 36.6* 36.0* 41.6* 46.2*  HGB 8.5* 7.5* 6.4* 7.7* 8.3*  HCT 24.8* 21.6* 19.7* 22.9* 24.4*  MCV 86.4 87.1 89.5 89.5 89.7  PLT 247 200 152 133* 168    Cardiac Enzymes: No results for input(s): CKTOTAL, CKMB, CKMBINDEX, TROPONINI in the last 168 hours.  BNP: Invalid  input(s): POCBNP  CBG: Recent Labs  Lab 11/11/21 1536 11/11/21 1918 11/11/21 2306 11/12/21 0306 11/12/21 0705  GLUCAP 265* 137* 199* 198* 216*    Microbiology: Results for orders placed or performed during the hospital encounter of 10/19/2021  Resp Panel by RT-PCR (Flu A&B, Covid) Nasopharyngeal Swab     Status: Abnormal   Collection Time: 10/14/2021  6:28 PM   Specimen: Nasopharyngeal Swab; Nasopharyngeal(NP) swabs in vial transport medium  Result Value Ref Range Status   SARS Coronavirus 2 by RT PCR NEGATIVE NEGATIVE Final    Comment: (NOTE) SARS-CoV-2 target nucleic acids are NOT DETECTED.  The SARS-CoV-2 RNA is generally  detectable in upper respiratory specimens during the acute phase of infection. The lowest concentration of SARS-CoV-2 viral copies this assay can detect is 138 copies/mL. A negative result does not preclude SARS-Cov-2 infection and should not be used as the sole basis for treatment or other patient management decisions. A negative result may occur with  improper specimen collection/handling, submission of specimen other than nasopharyngeal swab, presence of viral mutation(s) within the areas targeted by this assay, and inadequate number of viral copies(<138 copies/mL). A negative result must be combined with clinical observations, patient history, and epidemiological information. The expected result is Negative.  Fact Sheet for Patients:  EntrepreneurPulse.com.au  Fact Sheet for Healthcare Providers:  IncredibleEmployment.be  This test is no t yet approved or cleared by the [place] FDA and  has been authorized for detection and/or diagnosis of SARS-CoV-2 by FDA under an Emergency Use Authorization (EUA). This EUA will remain  in effect (meaning this test can be used) for the duration of the COVID-19 declaration under Section 564(b)(1) of the Act, 21 U.S.C.section 360bbb-3(b)(1), unless the authorization is terminated  or revoked sooner.       Influenza A by PCR POSITIVE (A) NEGATIVE Final   Influenza B by PCR NEGATIVE NEGATIVE Final    Comment: (NOTE) The Xpert Xpress SARS-CoV-2/FLU/RSV plus assay is intended as an aid in the diagnosis of influenza from Nasopharyngeal swab specimens and should not be used as a sole basis for treatment. Nasal washings and aspirates are unacceptable for Xpert Xpress SARS-CoV-2/FLU/RSV testing.  Fact Sheet for Patients: EntrepreneurPulse.com.au  Fact Sheet for Healthcare Providers: IncredibleEmployment.be  This test is not yet approved or cleared by the [place]  FDA and has been authorized for detection and/or diagnosis of SARS-CoV-2 by FDA under an Emergency Use Authorization (EUA). This EUA will remain in effect (meaning this test can be used) for the duration of the COVID-19 declaration under Section 564(b)(1) of the Act, 21 U.S.C. section 360bbb-3(b)(1), unless the authorization is terminated or revoked.  Performed at [facility], [address] [place], [place] [address]   Culture, blood (routine x 2)     Status: None   Collection Time: 10/26/2021  7:09 PM   Specimen: Left Antecubital; Blood  Result Value Ref Range Status   Specimen Description LEFT ANTECUBITAL  Final   Special Requests   Final    BOTTLES DRAWN AEROBIC AND ANAEROBIC Blood Culture results may not be optimal due to an inadequate volume of blood received in culture bottles   Culture   Final    NO GROWTH 5 DAYS Performed at [facility], [address], [place], [place] [address]    Report Status 10/29/2021 FINAL  Final  Culture, blood (routine x 2)     Status: None   Collection Time: 10/18/2021  7:11 PM   Specimen: BLOOD RIGHT FOREARM  Result Value Ref Range Status   Specimen Description BLOOD RIGHT FOREARM  Final   Special Requests   Final    BOTTLES DRAWN AEROBIC AND ANAEROBIC Blood Culture results may not be optimal due to an inadequate volume of blood received in culture bottles   Culture   Final    NO GROWTH 5 DAYS Performed at [facility], [address], [place], [place] [address]    Report Status 10/29/2021 FINAL  Final  MRSA Next Gen by PCR, Nasal     Status: None   Collection Time: 10/25/21 12:32 AM   Specimen: Nasal Mucosa; Nasal Swab  Result Value Ref Range Status   MRSA by PCR Next Gen NOT DETECTED NOT DETECTED Final    Comment: (NOTE) The GeneXpert MRSA Assay (FDA approved for NASAL specimens only), is one component of a comprehensive MRSA colonization surveillance program. It is not intended to diagnose MRSA infection nor to guide or monitor treatment  for MRSA infections. Test performance is not FDA approved in patients less than 30 years old. Performed at [facility], [address], [place], [place] [address]   Culture, Respiratory w Gram Stain     Status: None   Collection Time: 10/28/21  3:31 PM   Specimen: Tracheal Aspirate; Respiratory  Result Value Ref Range Status   Specimen Description TRACHEAL ASPIRATE  Final   Special Requests NONE  Final   Gram Stain   Final    RARE WBC PRESENT, PREDOMINANTLY MONONUCLEAR NO ORGANISMS SEEN    Culture   Final    NO GROWTH 2 DAYS Performed at [facility], [address] [place], [place] [address]    Report Status 10/31/2021 FINAL  Final  Culture, Respiratory w Gram Stain     Status: None   Collection Time: 11/01/21  9:23 AM   Specimen: Tracheal Aspirate; Respiratory  Result Value Ref Range Status   Specimen Description TRACHEAL ASPIRATE  Final   Special Requests NONE  Final   Gram Stain   Final    FEW WBC PRESENT, PREDOMINANTLY MONONUCLEAR MODERATE YEAST Performed at [facility], [address], [place], [place] [address]    Culture FEW CANDIDA TROPICALIS  Final   Report Status 11/04/2021 FINAL  Final  Culture, Respiratory w Gram Stain     Status: None   Collection Time: 11/09/21 10:16 AM   Specimen: Tracheal Aspirate; Respiratory  Result Value Ref Range Status   Specimen Description TRACHEAL ASPIRATE  Final   Special Requests NONE  Final   Gram Stain   Final    ABUNDANT WBC PRESENT,BOTH PMN AND MONONUCLEAR ABUNDANT GRAM NEGATIVE RODS FEW GRAM VARIABLE ROD FEW GRAM POSITIVE COCCI Performed at [facility], [place] [address] [place],  [address]    Culture ABUNDANT BURKHOLDERIA CEPACIA  Final   Report Status 11/11/2021 FINAL  Final   Organism ID, Bacteria BURKHOLDERIA CEPACIA  Final      Susceptibility   Burkholderia cepacia - MIC*    CEFAZOLIN >=64 RESISTANT Resistant     GENTAMICIN >=16 RESISTANT Resistant     CIPROFLOXACIN 1 SENSITIVE  Sensitive     IMIPENEM >=16 RESISTANT Resistant     TRIMETH/SULFA <=20 SENSITIVE Sensitive     * ABUNDANT BURKHOLDERIA CEPACIA  Culture, blood (routine x 2)     Status: Abnormal   Collection Time: 11/09/21 10:35 AM   Specimen: BLOOD  Result Value Ref Range Status   Specimen Description BLOOD BLOOD LEFT HAND  Final  Special Requests   Final    BOTTLES DRAWN AEROBIC ONLY Blood Culture results may not be optimal due to an inadequate volume of blood received in culture bottles   Culture  Setup Time   Final    GRAM NEGATIVE RODS AEROBIC BOTTLE ONLY CRITICAL RESULT CALLED TO, READ BACK BY AND VERIFIED WITHJiles [facility] 3086 11/10/21 A BROWNING Performed at [facility], [address] [place], [place] [address]    Culture BURKHOLDERIA CEPACIA (A)  Final   Report Status 11/12/2021 FINAL  Final   Organism ID, Bacteria BURKHOLDERIA CEPACIA  Final      Susceptibility   Burkholderia cepacia - MIC*    CEFAZOLIN >=64 RESISTANT Resistant     GENTAMICIN >=16 RESISTANT Resistant     CIPROFLOXACIN 1 SENSITIVE Sensitive     IMIPENEM >=16 RESISTANT Resistant     TRIMETH/SULFA <=20 SENSITIVE Sensitive     * BURKHOLDERIA CEPACIA  Blood Culture ID Panel (Reflexed)     Status: None   Collection Time: 11/09/21 10:35 AM  Result Value Ref Range Status   Enterococcus faecalis NOT DETECTED NOT DETECTED Final   Enterococcus Faecium NOT DETECTED NOT DETECTED Final   Listeria monocytogenes NOT DETECTED NOT DETECTED Final   Staphylococcus species NOT DETECTED NOT DETECTED Final   Staphylococcus aureus (BCID) NOT DETECTED NOT DETECTED Final   Staphylococcus epidermidis NOT DETECTED NOT DETECTED Final   Staphylococcus lugdunensis NOT DETECTED NOT DETECTED Final   Streptococcus species NOT DETECTED NOT DETECTED Final   Streptococcus agalactiae NOT DETECTED NOT DETECTED Final   Streptococcus pneumoniae NOT DETECTED NOT DETECTED Final   Streptococcus pyogenes NOT DETECTED NOT DETECTED Final    A.calcoaceticus-baumannii NOT DETECTED NOT DETECTED Final   Bacteroides fragilis NOT DETECTED NOT DETECTED Final   Enterobacterales NOT DETECTED NOT DETECTED Final   Enterobacter cloacae complex NOT DETECTED NOT DETECTED Final   Escherichia coli NOT DETECTED NOT DETECTED Final   Klebsiella aerogenes NOT DETECTED NOT DETECTED Final   Klebsiella oxytoca NOT DETECTED NOT DETECTED Final   Klebsiella pneumoniae NOT DETECTED NOT DETECTED Final   Proteus species NOT DETECTED NOT DETECTED Final   Salmonella species NOT DETECTED NOT DETECTED Final   Serratia marcescens NOT DETECTED NOT DETECTED Final   Haemophilus influenzae NOT DETECTED NOT DETECTED Final   Neisseria meningitidis NOT DETECTED NOT DETECTED Final   Pseudomonas aeruginosa NOT DETECTED NOT DETECTED Final   Stenotrophomonas maltophilia NOT DETECTED NOT DETECTED Final   Candida albicans NOT DETECTED NOT DETECTED Final   Candida auris NOT DETECTED NOT DETECTED Final   Candida glabrata NOT DETECTED NOT DETECTED Final   Candida krusei NOT DETECTED NOT DETECTED Final   Candida parapsilosis NOT DETECTED NOT DETECTED Final   Candida tropicalis NOT DETECTED NOT DETECTED Final   Cryptococcus neoformans/gattii NOT DETECTED NOT DETECTED Final    Comment: Performed at [facility], [address], [place], [place] [address]  Culture, blood (routine x 2)     Status: Abnormal   Collection Time: 11/09/21 10:51 AM   Specimen: BLOOD  Result Value Ref Range Status   Specimen Description BLOOD BLOOD RIGHT HAND  Final   Special Requests   Final    BOTTLES DRAWN AEROBIC ONLY Blood Culture results may not be optimal due to an inadequate volume of blood received in culture bottles   Culture  Setup Time   Final    GRAM POSITIVE COCCI AEROBIC BOTTLE ONLY Organism ID to follow CRITICAL RESULT CALLED TO,  READ BACK BY AND VERIFIED WITHAndres Shad PHARMD [id number] 11/10/21 A BROWNING    Culture (A)  Final    STAPHYLOCOCCUS EPIDERMIDIS THE SIGNIFICANCE OF  ISOLATING THIS ORGANISM FROM A SINGLE SET OF BLOOD CULTURES WHEN MULTIPLE SETS ARE DRAWN IS UNCERTAIN. PLEASE NOTIFY THE MICROBIOLOGY DEPARTMENT WITHIN ONE WEEK IF SPECIATION AND SENSITIVITIES ARE REQUIRED. Performed at [facility], [place] [address], [place], [place] [address]    Report Status 11/11/2021 FINAL  Final  Blood Culture ID Panel (Reflexed)     Status: Abnormal   Collection Time: 11/09/21 10:51 AM  Result Value Ref Range Status   Enterococcus faecalis NOT DETECTED NOT DETECTED Final   Enterococcus Faecium NOT DETECTED NOT DETECTED Final   Listeria monocytogenes NOT DETECTED NOT DETECTED Final   Staphylococcus species DETECTED (A) NOT DETECTED Final    Comment: CRITICAL RESULT CALLED TO, READ BACK BY AND VERIFIED WITHAndres Shad PHARMD [id number] 11/10/21 A BROWNING    Staphylococcus aureus (BCID) NOT DETECTED NOT DETECTED Final   Staphylococcus epidermidis DETECTED (A) NOT DETECTED Final    Comment: Methicillin (oxacillin) resistant coagulase negative staphylococcus. Possible blood culture contaminant (unless isolated from more than one blood culture draw or clinical case suggests pathogenicity). No antibiotic treatment is indicated for blood  culture contaminants. CRITICAL RESULT CALLED TO, READ BACK BY AND VERIFIED WITH: Andres Shad PHARMD [id number] 11/10/21 A BROWNING    Staphylococcus lugdunensis NOT DETECTED NOT DETECTED Final   Streptococcus species NOT DETECTED NOT DETECTED Final   Streptococcus agalactiae NOT DETECTED NOT DETECTED Final   Streptococcus pneumoniae NOT DETECTED NOT DETECTED Final   Streptococcus pyogenes NOT DETECTED NOT DETECTED Final   A.calcoaceticus-baumannii NOT DETECTED NOT DETECTED Final   Bacteroides fragilis NOT DETECTED NOT DETECTED Final   Enterobacterales NOT DETECTED NOT DETECTED Final   Enterobacter cloacae complex NOT DETECTED NOT DETECTED Final   Escherichia coli NOT DETECTED NOT DETECTED Final   Klebsiella aerogenes NOT DETECTED NOT DETECTED Final    Klebsiella oxytoca NOT DETECTED NOT DETECTED Final   Klebsiella pneumoniae NOT DETECTED NOT DETECTED Final   Proteus species NOT DETECTED NOT DETECTED Final   Salmonella species NOT DETECTED NOT DETECTED Final   Serratia marcescens NOT DETECTED NOT DETECTED Final   Haemophilus influenzae NOT DETECTED NOT DETECTED Final   Neisseria meningitidis NOT DETECTED NOT DETECTED Final   Pseudomonas aeruginosa NOT DETECTED NOT DETECTED Final   Stenotrophomonas maltophilia NOT DETECTED NOT DETECTED Final   Candida albicans NOT DETECTED NOT DETECTED Final   Candida auris NOT DETECTED NOT DETECTED Final   Candida glabrata NOT DETECTED NOT DETECTED Final   Candida krusei NOT DETECTED NOT DETECTED Final   Candida parapsilosis NOT DETECTED NOT DETECTED Final   Candida tropicalis NOT DETECTED NOT DETECTED Final   Cryptococcus neoformans/gattii NOT DETECTED NOT DETECTED Final   Methicillin resistance mecA/C DETECTED (A) NOT DETECTED Final    Comment: CRITICAL RESULT CALLED TO, READ BACK BY AND VERIFIED WITHAndres Shad [facility] 0962 11/10/21 A BROWNING Performed at [facility], [address] [place], [place] [address]   MRSA Next Gen by PCR, Nasal     Status: None   Collection Time: 11/09/21  1:31 PM   Specimen: Nasal Mucosa; Nasal Swab  Result Value Ref Range Status   MRSA by PCR Next Gen NOT DETECTED NOT DETECTED Final    Comment: (NOTE) The GeneXpert MRSA Assay (FDA approved for NASAL specimens only), is one component of a comprehensive MRSA colonization surveillance program. It  is not intended to diagnose MRSA infection nor to guide or monitor treatment for MRSA infections. Test performance is not FDA approved in patients less than 7 years old. Performed at [facility], [place] [address] [place], [place] [address]     Coagulation Studies: No results for input(s): LABPROT, INR in the last 72 hours.  Urinalysis: No results for input(s): COLORURINE, LABSPEC, PHURINE, GLUCOSEU,  HGBUR, BILIRUBINUR, KETONESUR, PROTEINUR, UROBILINOGEN, NITRITE, LEUKOCYTESUR in the last 72 hours.  Invalid input(s): APPERANCEUR    Imaging: No results found.   Medications:     prismasol BGK 4/2.5 200 mL/hr at 11/11/21 1752   sodium chloride Stopped (11/07/21 0835)   sodium chloride Stopped (11/10/21 0651)   sodium chloride Stopped (11/09/21 2147)   amiodarone 30 mg/hr (11/12/21 1000)   ceFEPime (MAXIPIME) IV Stopped (11/12/21 0933)   dexmedetomidine (PRECEDEX) IV infusion 0.4 mcg/kg/hr (11/12/21 1000)   feeding supplement (VITAL 1.5 CAL) 1,000 mL (11/11/21 1809)   heparin 1,000 Units/hr (11/12/21 1000)   norepinephrine (LEVOPHED) Adult infusion 22 mcg/min (11/12/21 1000)   prismasol BGK 2/2.5 dialysis solution 1,000 mL/hr at 11/12/21 0843   prismasol BGK 2/2.5 replacement solution 200 mL/hr at 11/11/21 1335   promethazine (PHENERGAN) injection (IM or IVPB)     sulfamethoxazole-trimethoprim Stopped (11/12/21 0653)   vancomycin 150 mL/hr at 11/11/21 1300   vasopressin 0.03 Units/min (11/12/21 1000)    sodium chloride   Intravenous Once   amiodarone  150 mg Intravenous Once   arformoterol  15 mcg Nebulization BID   vitamin C  250 mg Per Tube BID   aspirin  81 mg Per Tube Daily   atorvastatin  80 mg Per Tube QPM   B-complex with vitamin C  1 tablet Per Tube Daily   budesonide (PULMICORT) nebulizer solution  0.5 mg Nebulization BID   chlorhexidine gluconate (MEDLINE KIT)  15 mL Mouth Rinse BID   Chlorhexidine Gluconate Cloth  6 each Topical Daily   feeding supplement (PROSource TF)  90 mL Per Tube TID   insulin aspart  0-20 Units Subcutaneous Q4H   insulin aspart  12 Units Subcutaneous Q4H   insulin glargine-yfgn  40 Units Subcutaneous BID   lactulose  30 g Per Tube Daily   mouth rinse  15 mL Mouth Rinse 10 times per day   midodrine  10 mg Per Tube Q8H   nutrition supplement (JUVEN)  1 packet Per Tube BID BM   pantoprazole sodium  40 mg Per Tube Daily   revefenacin  175  mcg Nebulization Daily   sodium chloride flush  10-40 mL Intracatheter Q12H   Place/Maintain arterial line **AND** sodium chloride, sodium chloride, acetaminophen **OR** acetaminophen, albuterol, fentaNYL (SUBLIMAZE) injection, fentaNYL (SUBLIMAZE) injection, heparin, hydrALAZINE, labetalol, polyethylene glycol, polyvinyl alcohol, promethazine (PHENERGAN) injection (IM or IVPB), senna-docusate, sodium chloride, sodium chloride flush  Assessment/ Plan:  Acute kidney injury on CKD IIIa: Likely 2/2 ATN due to septic shock.  Follows with [facility] as an outpatient.  Started CRRT on 12/5 after refractory to Lasix challenge.  Volume status and electrolytes significantly improved on CRRT.  CRRT briefly stopped but resumed 12/13 as patient had markedly elevated BUN. His BUN/sCr are down trending on CRRT, He continues to require pressor support to maintain his blood pressures. Remains anuric.  - We will continue CRRT, slowly remove fluid (~net neg 50-100cc/h)   2. Acute hypoxic/hypercapnic respiratory failure/ARDS/influenza A: Currently on cefepime and vancomycin vent per PCCM.   3. Septic shock: On pressors now however in the  setting of CRRT.  Marked leukocytosis mildly improved, there is no evidence of infection at the site of insertion of his catheter. Blood cultures x2 12/14 MRS epi and GNR speciation pending, resp Cx with burkholderia cepacia, currently on vancomycin, cefepime. ID following.    4. Acute on chronic systolic heart failure due to ischemic cardiomyopathy: EF 35 to 40%.  Initially trialed on IV diuretics without response therefore volume optimized with CRRT.  Euvolemic now.  Cardiology is following.     5. Hyponatremia, hypervolemic: Managed with CRRT.  Continue fluid restriction.   6. Afib with RVR Continue amiodarone and heparin infusion    LOS: [place] _0 _1 :12 AM

## 2021-11-12 NOTE — Progress Notes (Signed)
ANTICOAGULATION & ANTIBIOTIC CONSULT NOTE  Pharmacy Consult for Heparin + Vancomycin Indication: atrial fibrillation   Allergies  Allergen Reactions   Entresto [Sacubitril-Valsartan]     Worsening renal function and weight gain    Patient Measurements: Height: 5\' 11"  (180.3 cm) Weight: 74.8 kg (164 lb 14.5 oz) IBW/kg (Calculated) : 75.3 Heparin Dosing Weight: 80.3 kg   Vital Signs: Temp: 98.4 F (36.9 C) (12/17 0345) Temp Source: Axillary (12/17 0345) BP: 158/76 (12/17 0600) Pulse Rate: 84 (12/17 0700)  Labs: Recent Labs    11/11/21 0414 11/11/21 0745 11/11/21 1542 11/11/21 1755 11/12/21 0354 11/12/21 0355  HGB 6.4*  --   --  7.7* 8.3*  --   HCT 19.7*  --   --  22.9* 24.4*  --   PLT 152  --   --  133* 168  --   APTT  --  58*  --   --   --   --   HEPARINUNFRC 0.25*  --   --  0.34  --  0.38  CREATININE 2.17*  --  2.06*  --  2.01*  --      Estimated Creatinine Clearance: 37.7 mL/min (A) (by C-G formula based on SCr of 2.01 mg/dL (H)).  Assessment: 65 y.o. male with Afib to continue on IV heparin.  Heparin level therapeutic at 0.38 units/mL.  Hemoglobin and platelet count improving.   Pharmacy also consulted to dose vancomycin for septic shock.  Patient is also on Bactrim and Cefepime for Burkholderia and GNR bacteremia.  He continues on CRRT and vancomycin level is therapeutic at 17 mcg/mL.  Hypothermic on CRRT, WBC fluctuating - currently on the rise.  Doxy 11/29 >> 12/2 CTX 11/29 >> 12/2 Vanc 12/2 >> 12/3, restart 12/14 >> Zosyn 12/14 >> 12/16 Eraxis 12/14 >>12/16 Cefepime 12/2 >> 12/11; 12/16 >> 12/17 Tamiflu 11/28 >>12/3 Bactrim 12/16 >>   12/17 VT = 17 mcg/mL on 750mg  q24 >> no change   11/28 BCx - negative 11/28 Flu A - positive 11/29 MRSA PCR - negative 12/2 TA - negative 12/6 TA - C. tropicalis 12/14 MRSA PCR - negative 12/14 BCx - 1/2 MRSE, 1/2 Burkholderia 12/14 TA - Burkholderia cepacia (Sens - Cipro, Bactrim)   Goal of Therapy:  Heparin  level 0.3-0.7 units/ml Monitor platelets by anticoagulation protocol: Yes Vanc trough 15-20 mcg/mL    Plan:  Continue heparin gtt at 1000 units/hr Daily heparin level and CBC Monitor closely for bleeding  Continue vanc 750mg  IV Q24H for now with MRSE D/C cefepime per discussion with MD Bactrim 400mg  IV Q8H (~5 mg/kg/dose)  Monitor CRRT tolerance/interruption, clinical progress to de-escalate abx  Darletta Noblett D. 1/15, PharmD, BCPS, BCCCP 11/12/2021, 12:29 PM

## 2021-11-12 NOTE — Progress Notes (Signed)
eLink Physician-Brief Progress Note Patient Name: Joseph [name] DOB: [birth year] MRN: [id number]   Date of Service  11/12/2021  HPI/Events of Note  Severe agitation, restless inspite of max precedex & int fent  eICU Interventions  Versed x 1     Intervention Category Intermediate Interventions: Other:  Joseph [name] Joseph [name] 11/12/2021, 3:16 AM

## 2021-11-13 DIAGNOSIS — J189 Pneumonia, unspecified organism: Secondary | ICD-10-CM

## 2021-11-13 LAB — CBC
HCT: 24.4 % — ABNORMAL LOW (ref 39.0–52.0)
Hemoglobin: 7.8 g/dL — ABNORMAL LOW (ref 13.0–17.0)
MCH: 29.2 pg (ref 26.0–34.0)
MCHC: 32 g/dL (ref 30.0–36.0)
MCV: 91.4 fL (ref 80.0–100.0)
Platelets: 188 10*3/uL (ref 150–400)
RBC: 2.67 MIL/uL — ABNORMAL LOW (ref 4.22–5.81)
RDW: 19.3 % — ABNORMAL HIGH (ref 11.5–15.5)
WBC: 51.4 10*3/uL (ref 4.0–10.5)
nRBC: 5.4 % — ABNORMAL HIGH (ref 0.0–0.2)

## 2021-11-13 LAB — GLUCOSE, CAPILLARY
Glucose-Capillary: 148 mg/dL — ABNORMAL HIGH (ref 70–99)
Glucose-Capillary: 211 mg/dL — ABNORMAL HIGH (ref 70–99)
Glucose-Capillary: 169 mg/dL — ABNORMAL HIGH (ref 70–99)
Glucose-Capillary: 191 mg/dL — ABNORMAL HIGH (ref 70–99)
Glucose-Capillary: 141 mg/dL — ABNORMAL HIGH (ref 70–99)
Glucose-Capillary: 159 mg/dL — ABNORMAL HIGH (ref 70–99)

## 2021-11-13 LAB — RENAL FUNCTION PANEL
Albumin: 1.6 g/dL — ABNORMAL LOW (ref 3.5–5.0)
Anion gap: 15 (ref 5–15)
BUN: 75 mg/dL — ABNORMAL HIGH (ref 8–23)
CO2: 19 mmol/L — ABNORMAL LOW (ref 22–32)
Calcium: 8.1 mg/dL — ABNORMAL LOW (ref 8.9–10.3)
Chloride: 95 mmol/L — ABNORMAL LOW (ref 98–111)
Creatinine, Ser: 1.94 mg/dL — ABNORMAL HIGH (ref 0.61–1.24)
GFR, Estimated: 37 mL/min — ABNORMAL LOW (ref >60)
Glucose, Bld: 198 mg/dL — ABNORMAL HIGH (ref 70–99)
Phosphorus: 4.8 mg/dL — ABNORMAL HIGH (ref 2.5–4.6)
Potassium: 4.1 mmol/L (ref 3.5–5.1)
Sodium: 129 mmol/L — ABNORMAL LOW (ref 135–145)

## 2021-11-13 LAB — HEPARIN LEVEL (UNFRACTIONATED): Heparin Unfractionated: 0.48 IU/mL (ref 0.30–0.70)

## 2021-11-13 LAB — AMMONIA: Ammonia: 56 umol/L — ABNORMAL HIGH (ref 9–35)

## 2021-11-13 LAB — MAGNESIUM: Magnesium: 2.8 mg/dL — ABNORMAL HIGH (ref 1.7–2.4)

## 2021-11-13 MED ORDER — PHENYLEPHRINE HCL-NACL 20-0.9 MG/250ML-% IV SOLN
0.0000 ug/min | INTRAVENOUS | Status: DC
  Administered 2021-11-13: 21:00:00 20 ug/min via INTRAVENOUS
  Administered 2021-11-14 (×2): 90 ug/min via INTRAVENOUS
  Administered 2021-11-14: 03:00:00 100 ug/min via INTRAVENOUS
  Filled 2021-11-13 (×4): qty 250

## 2021-11-13 MED ORDER — POLYVINYL ALCOHOL 1.4 % OP SOLN
1.0000 [drp] | Freq: Two times a day (BID) | OPHTHALMIC | Status: DC
Start: 2021-11-13 — End: 2021-11-14
  Administered 2021-11-13 – 2021-11-14 (×2): 1 [drp] via OPHTHALMIC
  Filled 2021-11-13: qty 15

## 2021-11-13 MED ORDER — FENTANYL CITRATE (PF) 100 MCG/2ML IJ SOLN
25.0000 ug | INTRAMUSCULAR | Status: AC | PRN
  Administered 2021-11-13: 14:00:00 25 ug via INTRAVENOUS
  Filled 2021-11-13: qty 2

## 2021-11-13 MED ORDER — FENTANYL CITRATE (PF) 100 MCG/2ML IJ SOLN
25.0000 ug | INTRAMUSCULAR | Status: DC | PRN
  Administered 2021-11-14 (×2): 50 ug via INTRAVENOUS
  Filled 2021-11-13 (×2): qty 2

## 2021-11-13 MED ORDER — LACTATED RINGERS IV BOLUS
1000.0000 mL | Freq: Once | INTRAVENOUS | Status: AC
  Administered 2021-11-13: 18:00:00 1000 mL via INTRAVENOUS

## 2021-11-13 MED ORDER — AMIODARONE IV BOLUS ONLY 150 MG/100ML
150.0000 mg | Freq: Once | INTRAVENOUS | Status: AC
  Administered 2021-11-13: 18:00:00 150 mg via INTRAVENOUS
  Filled 2021-11-13: qty 100

## 2021-11-13 MED ORDER — POLYVINYL ALCOHOL 1.4 % OP SOLN
1.0000 [drp] | OPHTHALMIC | Status: DC | PRN
  Administered 2021-11-13 – 2021-11-14 (×2): 1 [drp] via OPHTHALMIC
  Filled 2021-11-13: qty 15

## 2021-11-13 NOTE — Care Plan (Signed)
GOALS OF CARE DISCUSSION   The Clinical status was relayed to daughter over the phone in detail.   Updated and notified of patients medical condition.    Patient is having a weak cough and struggling to remove secretions.   Patient with increased WOB and using accessory muscles to breathe Explained to family course of therapy and the modalities  Evidence of kidney failure   Patient with Progressive multiorgan failure with a very high probablity of a very minimal chance of meaningful recovery despite all aggressive and optimal medical therapy.  Per family request patient was made DNR and will continue full scope of treatment.      Family are satisfied with Plan of action and management. All questions answered    Cheri Fowler MD [facility] See Amion for pager If no response to pager, please call [phone] until 7pm After 7pm, Please call E-link [phone]

## 2021-11-13 NOTE — Progress Notes (Addendum)
NAME:  Joseph [name], MRN:  [id number], DOB:  1956-09-25, LOS: 20 ADMISSION DATE:  10/26/2021, CONSULTATION DATE:  10/28/2021 REFERRING MD:  Dr. Arbutus Leas, CHIEF COMPLAINT:  Acute hypoxic respiratory failure    History of Present Illness:  65 yo male presented to [facility] ER with respiratory distress and hypoxia.  Found to have COPD exacerbation from Influenza A pneumonia and acute on chronic systolic CHF with acute pulmonary edema.  Started on amiodarone for a fib with RVR.  Failed Bipap and required intubation.  Pertinent  Medical History  CHF, COPD, CAD s/p CABG, HTN, HLD, MRSA bacteremia, and prior GI bleed  Significant Hospital Events:  11/28 admitted at [facility] for acute hypoxic respiratory failure. CT chest 10/17/2021 >> patchy GGO in upper lobes, faint GGO in lower lobes, moderate effusions Echo 10/25/21 >> EF 35 to 40%, mod MR, severe LA dilation Renal u/s 10/29/21 >> medical renal disease 12/2 intubated and [facility]  10/31/2021 CVVHD initiated; cefepime started 12/7 off vaso; remains on levo 12/8 restart vaso 12/10 off CRRT 12/11 off pressors; Tm 101.2, increased WBC 12/12: - central line and arterial line d/c'ed 12/12 12/13: Doppler of arms b >> indeterminate SVT Rt cephalic vein Doppler of legs b/l 11/08/21 >> no DVT, PCT elevated. WBC still in 40s. PCT elevated.  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  12/18 overnight increasing NE requirement  Interim History / Subjective:  WBC incr to 51 from 46 NE increased overnight -- remains on NE and vaso   PSV 5/5 for almost 2 hours   Objective   Blood pressure 113/67, pulse 94, temperature 97.6 F (36.4 C), temperature source Axillary, resp. rate (!) 31, height 5\' 11"  (1.803 m), weight 71.7 kg, SpO2 96 %.    Vent Mode: PRVC FiO2 (%):  [40 %] 40 % Set Rate:  [18 bmp-24 bmp] 18 bmp Vt Set:  [600 mL] 600  mL PEEP:  [5 cmH20] 5 cmH20 Pressure Support:  [5 cmH20] 5 cmH20 Plateau Pressure:  [15 cmH20-25 cmH20] 15 cmH20   Intake/Output Summary (Last 24 hours) at 11/13/2021 0853 Last data filed at 11/13/2021 0800 Gross per 24 hour  Intake 5697.27 ml  Output 6883 ml  Net -1185.73 ml   Filed Weights   11/11/21 0500 11/12/21 0500 11/13/21 0415  Weight: 75.8 kg 74.8 kg 71.7 kg    Examination:  General - critically and chronically ill appearing adult M HEENT: Open eyes. Anicteric sclera. Dry appearing eyes. ETT secure  Cardiac - rrr s1s2 cap refill < 3sec Chest - mechanically ventilated, symmetrical chest expansion diminished bibasilar sounds  Abdomen - Protuberant non-tender, soft  Extremities - Slight BLE pitting edema. No acute joint deformity  Skin - Toenail fungus. Pale, clean skin Neuro - Opens eyes spontaneously. Weakly nod to command Globally weak    Resolved Hospital Problem list   Elevated troponin from demand ischemia, Metabolic acidosis  Assessment & Plan:    Acute metabolic encephalopathy due to septic shock, hypoxia, renal failure  -has been receiving tx for hyperammonemia -- recheck ammonia 12/28 and determine if lactulose still needed  -Wean sedation for RASS 0 to -1 -metabolic correction as below -delirium precautions   Acute hypoxic/hypercapnic respiratory failure due to FluA PNA with ARDS, acute pulmonary edema and AECOPD Burkholderia PNA ([facility])  - intubated 12/2  -tolerated PSV 5/5 for almost 2 hours but fatigued. 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 Will discuss possible replacement vs temporary holiday (not sure if renal function would allow for holiday) vs maintain current   Afib RVR - now sinus Acute on chronic systolic CHF Hx HTN, HLD, CAD  -Adv HF team following -cont amio gtt  -cont hep gtt  - ASA lipitor -hold home enalapril, demadex, toprol, aldactone   AKI from ATN 2/2 septic shock and hypoxia superimposed on CKD IIIA Hyponatremia - nephro following - CRRT (started 12/5, had trial off but resumed 12/13)   Anemia of critical illness -trend H/H   DM2, poorly controlled, with hypergylycemia - increasing basal and TF coverage 12/17 - hold outpt jardiance  Superficial venous thrombosis R cephalic vein - already on anticoagulation - keep Rt arm elevated   Sacral deep tissue injury  - turn q2  -WOC recs  Best Practice (right click and "Reselect all SmartList Selections" daily)   Diet/type: tubefeeds DVT prophylaxis: systemic heparin GI prophylaxis: protonix Lines: d/c central line and arterial line 12/12; Right radial a line replaced 12/13, HD line from 12/3 remains in place Foley:  N/A Code Status:  full code Family: family updated 12/17   Labs:   CMP Latest Ref Rng & Units 11/13/2021 11/12/2021 11/12/2021  Glucose 70 - 99 mg/dL 198(H) 247(H) 210(H)  BUN 8 - 23 mg/dL 75(H) 81(H) 78(H)  Creatinine 0.61 - 1.24 mg/dL 1.94(H) 2.14(H) 2.01(H)  Sodium 135 - 145 mmol/L 129(L) 129(L) 128(L)  Potassium 3.5 - 5.1 mmol/L 4.1 4.3 4.2  Chloride 98 - 111 mmol/L 95(L) 95(L) 97(L)  CO2 22 - 32 mmol/L 19(L) 18(L) 21(L)  Calcium 8.9 - 10.3 mg/dL 8.1(L) 8.2(L) 7.6(L)  Total Protein 6.5 - 8.1 g/dL - - -  Total Bilirubin 0.3 - 1.2 mg/dL - - -  Alkaline Phos 38 - 126 U/L - - -  AST 15 - 41 U/L - - -  ALT 0 - 44 U/L - - -    CBC Latest Ref Rng & Units 11/13/2021 11/12/2021 11/11/2021  WBC 4.0 - 10.5 K/uL 51.4(HH) 46.2(H) 41.6(H)  Hemoglobin 13.0 -  17.0 g/dL 7.8(L) 8.3(L) 7.7(L)  Hematocrit 39.0 - 52.0 % 24.4(L) 24.4(L) 22.9(L)  Platelets 150 - 400 K/uL 188 168 133(L)    ABG    Component Value Date/Time   PHART 7.381 11/08/2021 2008   PCO2ART 27.9 (L) 11/08/2021 2008   PO2ART 106 11/08/2021 2008   HCO3 16.6 (L) 11/08/2021 2008   TCO2 17 (L) 11/08/2021 2008   ACIDBASEDEF 8.0 (H) 11/08/2021 2008   O2SAT 98.0 11/08/2021 2008    CBG (last 3)  Recent Labs    11/12/21 2300 11/13/21 0302 11/13/21 0709  GLUCAP 289* 148* 211*   Critical care time:    CRITICAL CARE Performed by: Cristal Generous   Total critical care time: 49 minutes  Critical care time was exclusive of separately billable procedures and treating other patients.  Critical care was necessary to treat or prevent imminent or life-threatening deterioration.  Critical care was time  spent personally by me on the following activities: development of treatment plan with patient and/or surrogate as well as nursing, discussions with consultants, evaluation of patient's response to treatment, examination of patient, obtaining history from patient or surrogate, ordering and performing treatments and interventions, ordering and review of laboratory studies, ordering and review of radiographic studies, pulse oximetry and re-evaluation of patient's condition.  Tessie Fass MSN, AGACNP-BC [facility] Amion for pager 11/13/2021, 8:53 AM

## 2021-11-13 NOTE — Progress Notes (Addendum)
Dr. Judeth Horn spoke with daughter [name] at bedside regarding goals of care.   Patient's spouse, other daughter and [name] want to have a family meeting tomorrow with MD and will be here about 11AM, RN will pass along in report  Darrold Span

## 2021-11-13 NOTE — Progress Notes (Addendum)
[facility] ROUNDING NOTE   Subjective:   Interval History: This is a 65 year old gentleman with septic shock probably secondary to pneumonia and acute hypoxic respiratory failure.  He has a complicating history of congestive heart failure with ejection fraction of 35 to 40%.  He has required CVVHD since 11/06/2021.  His hospital course has been complicated by atrial fibrillation with rapid ventricular rate as well as NSVT treated with amiodarone drip.  Baseline creatinine appears to be about 1.5 to 2 mg/dL.  Blood pressure 97/40 pulse 87 temperature 97.6 O2 sats 94% FiO2 40%  Sodium 129 potassium 4.1 chloride 95 CO2 19 BUN 75 creatinine 1.94 glucose 198 calcium 8.1 phosphorus 4.8 albumin 1.6 hemoglobin 7.8  Objective:  Vital signs in last 24 hours:  Temp:  [97.6 F (36.4 C)-98.2 F (36.8 C)] 97.6 F (36.4 C) (12/18 0711) Pulse Rate:  [72-116] 92 (12/18 0900) Resp:  [17-34] 26 (12/18 0900) BP: (100-152)/(60-81) 113/67 (12/17 2200) SpO2:  [90 %-100 %] 95 % (12/18 0900) Arterial Line BP: (84-177)/(37-58) 101/42 (12/18 0900) FiO2 (%):  [40 %] 40 % (12/18 0850) Weight:  [71.7 kg] 71.7 kg (12/18 0415)  Weight change: -3.1 kg Filed Weights   11/11/21 0500 11/12/21 0500 11/13/21 0415  Weight: 75.8 kg 74.8 kg 71.7 kg    Intake/Output: I/O last 3 completed shifts: In: 7588 [I.V.:2622.1; NG/GT:2359; IV Piggyback:3167.9] Out: 32549 [Other:10150; Stool:200]   Intake/Output this shift:  Total I/O In: 303 [I.V.:163; NG/GT:140] Out: 210 [Other:210]  General: Critically ill looking male, sedated and intubated, NAD Heart: RRR Lungs: Coarse breath sounds bilaterally, no wheezing Abdomen:soft,  non-distended Extremities:No edema of BLE  Neuro: Opens eyes spontaneously, not following commands  Dialysis Access: Temporary HD catheter in place.   Basic Metabolic Panel: Recent Labs  Lab 11/09/21 1604 11/10/21 0332 11/10/21 1623 11/11/21 0414 11/11/21 1542  11/12/21 0354 11/12/21 1612 11/13/21 0424  NA 137 134*   < > 133* 131* 128* 129* 129*  K 4.9 5.3*   < > 4.6 4.2 4.2 4.3 4.1  CL 104 102   < > 100 98 97* 95* 95*  CO2 18* 22   < > 23 20* 21* 18* 19*  GLUCOSE 155* 160*   < > 136* 259* 210* 247* 198*  BUN 139* 109*   < > 87* 89* 78* 81* 75*  CREATININE 3.22* 2.60*   < > 2.17* 2.06* 2.01* 2.14* 1.94*  CALCIUM 8.0* 7.6*   < > 7.8* 7.7* 7.6* 8.2* 8.1*  MG 2.6* 2.6*  --  2.6*  --  2.7*  --  2.8*  PHOS 3.9 2.9   < > 2.9 3.2 4.4 4.5 4.8*   < > = values in this interval not displayed.     Liver Function Tests: Recent Labs  Lab 11/11/21 0414 11/11/21 1542 11/12/21 0354 11/12/21 1612 11/13/21 0424  ALBUMIN <1.5* <1.5* 1.6* 1.6* 1.6*    No results for input(s): LIPASE, AMYLASE in the last 168 hours. Recent Labs  Lab 11/08/21 1158  AMMONIA 53*     CBC: Recent Labs  Lab 11/10/21 0332 11/11/21 0414 11/11/21 1755 11/12/21 0354 11/13/21 0424  WBC 36.6* 36.0* 41.6* 46.2* 51.4*  HGB 7.5* 6.4* 7.7* 8.3* 7.8*  HCT 21.6* 19.7* 22.9* 24.4* 24.4*  MCV 87.1 89.5 89.5 89.7 91.4  PLT 200 152 133* 168 188     Cardiac Enzymes: No results for input(s): CKTOTAL, CKMB, CKMBINDEX, TROPONINI in the last 168 hours.  BNP: Invalid input(s): POCBNP  CBG: Recent Labs  Lab 11/12/21 1600 11/12/21 1906 11/12/21 2300 11/13/21 0302 11/13/21 0709  GLUCAP 232* 178* 289* 148* 16*     Microbiology: Results for orders placed or performed during the hospital encounter of 10/22/2021  Resp Panel by RT-PCR (Flu A&B, Covid) Nasopharyngeal Swab     Status: Abnormal   Collection Time: 10/18/2021  6:28 PM   Specimen: Nasopharyngeal Swab; Nasopharyngeal(NP) swabs in vial transport medium  Result Value Ref Range Status   SARS Coronavirus 2 by RT PCR NEGATIVE NEGATIVE Final    Comment: (NOTE) SARS-CoV-2 target nucleic acids are NOT DETECTED.  The SARS-CoV-2 RNA is generally detectable in upper respiratory specimens during the acute phase of  infection. The lowest concentration of SARS-CoV-2 viral copies this assay can detect is 138 copies/mL. A negative result does not preclude SARS-Cov-2 infection and should not be used as the sole basis for treatment or other patient management decisions. A negative result may occur with  improper specimen collection/handling, submission of specimen other than nasopharyngeal swab, presence of viral mutation(s) within the areas targeted by this assay, and inadequate number of viral copies(<138 copies/mL). A negative result must be combined with clinical observations, patient history, and epidemiological information. The expected result is Negative.  Fact Sheet for Patients:  EntrepreneurPulse.com.au  Fact Sheet for Healthcare Providers:  IncredibleEmployment.be  This test is no t yet approved or cleared by the [place] FDA and  has been authorized for detection and/or diagnosis of SARS-CoV-2 by FDA under an Emergency Use Authorization (EUA). This EUA will remain  in effect (meaning this test can be used) for the duration of the COVID-19 declaration under Section 564(b)(1) of the Act, 21 U.S.C.section 360bbb-3(b)(1), unless the authorization is terminated  or revoked sooner.       Influenza A by PCR POSITIVE (A) NEGATIVE Final   Influenza B by PCR NEGATIVE NEGATIVE Final    Comment: (NOTE) The Xpert Xpress SARS-CoV-2/FLU/RSV plus assay is intended as an aid in the diagnosis of influenza from Nasopharyngeal swab specimens and should not be used as a sole basis for treatment. Nasal washings and aspirates are unacceptable for Xpert Xpress SARS-CoV-2/FLU/RSV testing.  Fact Sheet for Patients: EntrepreneurPulse.com.au  Fact Sheet for Healthcare Providers: IncredibleEmployment.be  This test is not yet approved or cleared by the [place] FDA and has been authorized for detection and/or diagnosis of  SARS-CoV-2 by FDA under an Emergency Use Authorization (EUA). This EUA will remain in effect (meaning this test can be used) for the duration of the COVID-19 declaration under Section 564(b)(1) of the Act, 21 U.S.C. section 360bbb-3(b)(1), unless the authorization is terminated or revoked.  Performed at [facility], [address] [place], [place] [address]   Culture, blood (routine x 2)     Status: None   Collection Time: 10/01/2021  7:09 PM   Specimen: Left Antecubital; Blood  Result Value Ref Range Status   Specimen Description LEFT ANTECUBITAL  Final   Special Requests   Final    BOTTLES DRAWN AEROBIC AND ANAEROBIC Blood Culture results may not be optimal due to an inadequate volume of blood received in culture bottles   Culture   Final    NO GROWTH 5 DAYS Performed at [facility], [address] [place],  [address]    Report Status 10/29/2021 FINAL  Final  Culture, blood (routine x 2)     Status: None   Collection Time: 10/13/2021  7:11 PM   Specimen: BLOOD RIGHT FOREARM  Result Value Ref Range Status  Specimen Description BLOOD RIGHT FOREARM  Final   Special Requests   Final    BOTTLES DRAWN AEROBIC AND ANAEROBIC Blood Culture results may not be optimal due to an inadequate volume of blood received in culture bottles   Culture   Final    NO GROWTH 5 DAYS Performed at [facility], [address] [place], [place] [address]    Report Status 10/29/2021 FINAL  Final  MRSA Next Gen by PCR, Nasal     Status: None   Collection Time: 10/25/21 12:32 AM   Specimen: Nasal Mucosa; Nasal Swab  Result Value Ref Range Status   MRSA by PCR Next Gen NOT DETECTED NOT DETECTED Final    Comment: (NOTE) The GeneXpert MRSA Assay (FDA approved for NASAL specimens only), is one component of a comprehensive MRSA colonization surveillance program. It is not intended to diagnose MRSA infection nor to guide or monitor treatment for MRSA infections. Test performance is not FDA approved in  patients less than 52 years old. Performed at [facility], [address] [place], [place] [address]   Culture, Respiratory w Gram Stain     Status: None   Collection Time: 10/28/21  3:31 PM   Specimen: Tracheal Aspirate; Respiratory  Result Value Ref Range Status   Specimen Description TRACHEAL ASPIRATE  Final   Special Requests NONE  Final   Gram Stain   Final    RARE WBC PRESENT, PREDOMINANTLY MONONUCLEAR NO ORGANISMS SEEN    Culture   Final    NO GROWTH 2 DAYS Performed at [facility], [address] [place], [place] [address]    Report Status 10/31/2021 FINAL  Final  Culture, Respiratory w Gram Stain     Status: None   Collection Time: 11/01/21  9:23 AM   Specimen: Tracheal Aspirate; Respiratory  Result Value Ref Range Status   Specimen Description TRACHEAL ASPIRATE  Final   Special Requests NONE  Final   Gram Stain   Final    FEW WBC PRESENT, PREDOMINANTLY MONONUCLEAR MODERATE YEAST Performed at [facility], [address] [place], [place] [address]    Culture FEW CANDIDA TROPICALIS  Final   Report Status 11/04/2021 FINAL  Final  Culture, Respiratory w Gram Stain     Status: None   Collection Time: 11/09/21 10:16 AM   Specimen: Tracheal Aspirate; Respiratory  Result Value Ref Range Status   Specimen Description TRACHEAL ASPIRATE  Final   Special Requests NONE  Final   Gram Stain   Final    ABUNDANT WBC PRESENT,BOTH PMN AND MONONUCLEAR ABUNDANT GRAM NEGATIVE RODS FEW GRAM VARIABLE ROD FEW GRAM POSITIVE COCCI Performed at [facility], [place] [address] [place], [place] [address]    Culture ABUNDANT BURKHOLDERIA CEPACIA  Final   Report Status 11/11/2021 FINAL  Final   Organism ID, Bacteria BURKHOLDERIA CEPACIA  Final      Susceptibility   Burkholderia cepacia - MIC*    CEFAZOLIN >=64 RESISTANT Resistant     GENTAMICIN >=16 RESISTANT Resistant     CIPROFLOXACIN 1 SENSITIVE Sensitive     IMIPENEM >=16 RESISTANT Resistant     TRIMETH/SULFA  <=20 SENSITIVE Sensitive     * ABUNDANT BURKHOLDERIA CEPACIA  Culture, blood (routine x 2)     Status: Abnormal   Collection Time: 11/09/21 10:35 AM   Specimen: BLOOD  Result Value Ref Range Status   Specimen Description BLOOD BLOOD LEFT HAND  Final   Special Requests   Final  BOTTLES DRAWN AEROBIC ONLY Blood Culture results may not be optimal due to an inadequate volume of blood received in culture bottles   Culture  Setup Time   Final    GRAM NEGATIVE RODS AEROBIC BOTTLE ONLY CRITICAL RESULT CALLED TO, READ BACK BY AND VERIFIED WITHJiles Garter [facility] 4097 11/10/21 A BROWNING Performed at [facility], [address], [place], [place] [address]    Culture BURKHOLDERIA CEPACIA (A)  Final   Report Status 11/12/2021 FINAL  Final   Organism ID, Bacteria BURKHOLDERIA CEPACIA  Final      Susceptibility   Burkholderia cepacia - MIC*    CEFAZOLIN >=64 RESISTANT Resistant     GENTAMICIN >=16 RESISTANT Resistant     CIPROFLOXACIN 1 SENSITIVE Sensitive     IMIPENEM >=16 RESISTANT Resistant     TRIMETH/SULFA <=20 SENSITIVE Sensitive     * BURKHOLDERIA CEPACIA  Blood Culture ID Panel (Reflexed)     Status: None   Collection Time: 11/09/21 10:35 AM  Result Value Ref Range Status   Enterococcus faecalis NOT DETECTED NOT DETECTED Final   Enterococcus Faecium NOT DETECTED NOT DETECTED Final   Listeria monocytogenes NOT DETECTED NOT DETECTED Final   Staphylococcus species NOT DETECTED NOT DETECTED Final   Staphylococcus aureus (BCID) NOT DETECTED NOT DETECTED Final   Staphylococcus epidermidis NOT DETECTED NOT DETECTED Final   Staphylococcus lugdunensis NOT DETECTED NOT DETECTED Final   Streptococcus species NOT DETECTED NOT DETECTED Final   Streptococcus agalactiae NOT DETECTED NOT DETECTED Final   Streptococcus pneumoniae NOT DETECTED NOT DETECTED Final   Streptococcus pyogenes NOT DETECTED NOT DETECTED Final   A.calcoaceticus-baumannii NOT DETECTED NOT DETECTED Final    Bacteroides fragilis NOT DETECTED NOT DETECTED Final   Enterobacterales NOT DETECTED NOT DETECTED Final   Enterobacter cloacae complex NOT DETECTED NOT DETECTED Final   Escherichia coli NOT DETECTED NOT DETECTED Final   Klebsiella aerogenes NOT DETECTED NOT DETECTED Final   Klebsiella oxytoca NOT DETECTED NOT DETECTED Final   Klebsiella pneumoniae NOT DETECTED NOT DETECTED Final   Proteus species NOT DETECTED NOT DETECTED Final   Salmonella species NOT DETECTED NOT DETECTED Final   Serratia marcescens NOT DETECTED NOT DETECTED Final   Haemophilus influenzae NOT DETECTED NOT DETECTED Final   Neisseria meningitidis NOT DETECTED NOT DETECTED Final   Pseudomonas aeruginosa NOT DETECTED NOT DETECTED Final   Stenotrophomonas maltophilia NOT DETECTED NOT DETECTED Final   Candida albicans NOT DETECTED NOT DETECTED Final   Candida auris NOT DETECTED NOT DETECTED Final   Candida glabrata NOT DETECTED NOT DETECTED Final   Candida krusei NOT DETECTED NOT DETECTED Final   Candida parapsilosis NOT DETECTED NOT DETECTED Final   Candida tropicalis NOT DETECTED NOT DETECTED Final   Cryptococcus neoformans/gattii NOT DETECTED NOT DETECTED Final    Comment: Performed at [facility], [address], [place],  [address]  Culture, blood (routine x 2)     Status: Abnormal   Collection Time: 11/09/21 10:51 AM   Specimen: BLOOD  Result Value Ref Range Status   Specimen Description BLOOD BLOOD RIGHT HAND  Final   Special Requests   Final    BOTTLES DRAWN AEROBIC ONLY Blood Culture results may not be optimal due to an inadequate volume of blood received in culture bottles   Culture  Setup Time   Final    GRAM POSITIVE COCCI AEROBIC BOTTLE ONLY Organism ID to follow CRITICAL RESULT CALLED TO, READ BACK BY AND VERIFIED WITH: J FRENS  PHARMD [id number] 11/10/21 A BROWNING    Culture (A)  Final    STAPHYLOCOCCUS EPIDERMIDIS THE SIGNIFICANCE OF ISOLATING THIS ORGANISM FROM A SINGLE SET OF BLOOD CULTURES  WHEN MULTIPLE SETS ARE DRAWN IS UNCERTAIN. PLEASE NOTIFY THE MICROBIOLOGY DEPARTMENT WITHIN ONE WEEK IF SPECIATION AND SENSITIVITIES ARE REQUIRED. Performed at [facility], [place] [address] [place], [place] [address]    Report Status 11/11/2021 FINAL  Final  Blood Culture ID Panel (Reflexed)     Status: Abnormal   Collection Time: 11/09/21 10:51 AM  Result Value Ref Range Status   Enterococcus faecalis NOT DETECTED NOT DETECTED Final   Enterococcus Faecium NOT DETECTED NOT DETECTED Final   Listeria monocytogenes NOT DETECTED NOT DETECTED Final   Staphylococcus species DETECTED (A) NOT DETECTED Final    Comment: CRITICAL RESULT CALLED TO, READ BACK BY AND VERIFIED WITHAndres Shad PHARMD [id number] 11/10/21 A BROWNING    Staphylococcus aureus (BCID) NOT DETECTED NOT DETECTED Final   Staphylococcus epidermidis DETECTED (A) NOT DETECTED Final    Comment: Methicillin (oxacillin) resistant coagulase negative staphylococcus. Possible blood culture contaminant (unless isolated from more than one blood culture draw or clinical case suggests pathogenicity). No antibiotic treatment is indicated for blood  culture contaminants. CRITICAL RESULT CALLED TO, READ BACK BY AND VERIFIED WITH: Andres Shad PHARMD [id number] 11/10/21 A BROWNING    Staphylococcus lugdunensis NOT DETECTED NOT DETECTED Final   Streptococcus species NOT DETECTED NOT DETECTED Final   Streptococcus agalactiae NOT DETECTED NOT DETECTED Final   Streptococcus pneumoniae NOT DETECTED NOT DETECTED Final   Streptococcus pyogenes NOT DETECTED NOT DETECTED Final   A.calcoaceticus-baumannii NOT DETECTED NOT DETECTED Final   Bacteroides fragilis NOT DETECTED NOT DETECTED Final   Enterobacterales NOT DETECTED NOT DETECTED Final   Enterobacter cloacae complex NOT DETECTED NOT DETECTED Final   Escherichia coli NOT DETECTED NOT DETECTED Final   Klebsiella aerogenes NOT DETECTED NOT DETECTED Final   Klebsiella oxytoca NOT DETECTED NOT DETECTED Final    Klebsiella pneumoniae NOT DETECTED NOT DETECTED Final   Proteus species NOT DETECTED NOT DETECTED Final   Salmonella species NOT DETECTED NOT DETECTED Final   Serratia marcescens NOT DETECTED NOT DETECTED Final   Haemophilus influenzae NOT DETECTED NOT DETECTED Final   Neisseria meningitidis NOT DETECTED NOT DETECTED Final   Pseudomonas aeruginosa NOT DETECTED NOT DETECTED Final   Stenotrophomonas maltophilia NOT DETECTED NOT DETECTED Final   Candida albicans NOT DETECTED NOT DETECTED Final   Candida auris NOT DETECTED NOT DETECTED Final   Candida glabrata NOT DETECTED NOT DETECTED Final   Candida krusei NOT DETECTED NOT DETECTED Final   Candida parapsilosis NOT DETECTED NOT DETECTED Final   Candida tropicalis NOT DETECTED NOT DETECTED Final   Cryptococcus neoformans/gattii NOT DETECTED NOT DETECTED Final   Methicillin resistance mecA/C DETECTED (A) NOT DETECTED Final    Comment: CRITICAL RESULT CALLED TO, READ BACK BY AND VERIFIED WITHAndres Shad [facility] 6294 11/10/21 A BROWNING Performed at [facility], [address] [place], [place] [address]   MRSA Next Gen by PCR, Nasal     Status: None   Collection Time: 11/09/21  1:31 PM   Specimen: Nasal Mucosa; Nasal Swab  Result Value Ref Range Status   MRSA by PCR Next Gen NOT DETECTED NOT DETECTED Final    Comment: (NOTE) The GeneXpert MRSA Assay (FDA approved for NASAL specimens only), is one component of a comprehensive MRSA colonization surveillance program. It is not intended to diagnose MRSA infection nor  to guide or monitor treatment for MRSA infections. Test performance is not FDA approved in patients less than 47 years old. Performed at [facility], [place] [address], [place], [place] [address]     Coagulation Studies: No results for input(s): LABPROT, INR in the last 72 hours.  Urinalysis: No results for input(s): COLORURINE, LABSPEC, PHURINE, GLUCOSEU, HGBUR, BILIRUBINUR, KETONESUR, PROTEINUR, UROBILINOGEN,  NITRITE, LEUKOCYTESUR in the last 72 hours.  Invalid input(s): APPERANCEUR    Imaging: No results found.   Medications:     prismasol BGK 4/2.5 200 mL/hr at 11/12/21 1845   sodium chloride Stopped (11/07/21 0835)   sodium chloride Stopped (11/10/21 0651)   sodium chloride Stopped (11/09/21 2147)   amiodarone 30 mg/hr (11/13/21 0900)   dexmedetomidine (PRECEDEX) IV infusion 1.22 mcg/kg/hr (11/13/21 0900)   feeding supplement (VITAL 1.5 CAL) 1,000 mL (11/12/21 1600)   heparin 1,000 Units/hr (11/13/21 0900)   norepinephrine (LEVOPHED) Adult infusion 26 mcg/min (11/13/21 0900)   prismasol BGK 2/2.5 dialysis solution 1,000 mL/hr at 11/13/21 0413   prismasol BGK 2/2.5 replacement solution 200 mL/hr at 11/12/21 1359   promethazine (PHENERGAN) injection (IM or IVPB)     sulfamethoxazole-trimethoprim Stopped (11/13/21 0533)   vancomycin Stopped (11/12/21 1350)   vasopressin 0.03 Units/min (11/13/21 0900)    sodium chloride   Intravenous Once   amiodarone  150 mg Intravenous Once   arformoterol  15 mcg Nebulization BID   vitamin C  250 mg Per Tube BID   aspirin  81 mg Per Tube Daily   atorvastatin  80 mg Per Tube QPM   B-complex with vitamin C  1 tablet Per Tube Daily   budesonide (PULMICORT) nebulizer solution  0.5 mg Nebulization BID   chlorhexidine gluconate (MEDLINE KIT)  15 mL Mouth Rinse BID   Chlorhexidine Gluconate Cloth  6 each Topical Daily   feeding supplement (PROSource TF)  90 mL Per Tube TID   insulin aspart  0-20 Units Subcutaneous Q4H   insulin aspart  12 Units Subcutaneous Q4H   insulin glargine-yfgn  40 Units Subcutaneous BID   lactulose  30 g Per Tube Daily   mouth rinse  15 mL Mouth Rinse 10 times per day   midodrine  10 mg Per Tube Q8H   nutrition supplement (JUVEN)  1 packet Per Tube BID BM   oxyCODONE  5 mg Per Tube Q6H   pantoprazole sodium  40 mg Per Tube Daily   polyvinyl alcohol  1 drop Both Eyes BID   revefenacin  175 mcg Nebulization Daily   sodium  chloride flush  10-40 mL Intracatheter Q12H   Place/Maintain arterial line **AND** sodium chloride, sodium chloride, acetaminophen **OR** acetaminophen, albuterol, fentaNYL (SUBLIMAZE) injection, fentaNYL (SUBLIMAZE) injection, heparin, hydrALAZINE, labetalol, polyethylene glycol, polyvinyl alcohol, promethazine (PHENERGAN) injection (IM or IVPB), senna-docusate, sodium chloride, sodium chloride flush  Assessment/ Plan:  Acute kidney injury on CKD IIIa: Likely 2/2 ATN due to septic shock.  Follows with [facility] as an outpatient.  Started CRRT on 12/5 after refractory to Lasix challenge.  Volume status and electrolytes significantly improved on CRRT.  CRRT briefly stopped but resumed 12/13 as patient had markedly elevated BUN. His BUN/sCr are down trending on CRRT, He continues to require pressor support to maintain his blood pressures. Remains anuric.  - We will continue CRRT, slowly remove fluid (~net neg 50-100cc/h)   2. Acute hypoxic/hypercapnic respiratory failure/ARDS/influenza A: Currently on cefepime and vancomycin vent per PCCM.   3. Septic shock: On pressors now however in  the setting of CRRT.  Marked leukocytosis mildly improved, there is no evidence of infection at the site of insertion of his catheter. Blood cultures x2 12/14 MRS epi and GNR speciation pending, resp Cx with burkholderia cepacia, currently on vancomycin, cefepime. ID following.    4. Acute on chronic systolic heart failure due to ischemic cardiomyopathy: EF 35 to 40%.  Initially trialed on IV diuretics without response therefore volume optimized with CRRT.  Euvolemic now.  Cardiology is following.     5. Hyponatremia, hypervolemic: Managed with CRRT.  Continue fluid restriction.   6. Afib with RVR Continue amiodarone and heparin infusion   Discussing possibility of a line holiday due to ongoing leukocytosis   LOS: [place] _0 _1 :15 AM

## 2021-11-13 NOTE — Progress Notes (Signed)
Patient went into A-fib and dropped blood pressure to 70s/40s, at maximum Levophed & Vasopressin dose, stopped pulling fluid, paged MD, Dr. Judeth Horn, received orders for Amiodarone bolus, lactated ringers bolus, and phenylephrine drip if needed, can keep even for now.  Darrold Span

## 2021-11-13 NOTE — Progress Notes (Signed)
MD Dr. Merrily Pew at bedside due to worsening patient blood pressure, order to give 500 ml LR bolus, amiodarone bolus, keep even, patient's daughter called, changed to DNR status. Hold off on phenylephrine for now unless needed after bolus.  Will continue to monitor.   Darrold Span

## 2021-11-13 NOTE — Progress Notes (Signed)
ANTICOAGULATION CONSULT NOTE  Pharmacy Consult for Heparin Indication: atrial fibrillation   Allergies  Allergen Reactions   Entresto [Sacubitril-Valsartan]     Worsening renal function and weight gain    Patient Measurements: Height: 5\' 11"  (180.3 cm) Weight: 71.7 kg (158 lb 1.1 oz) IBW/kg (Calculated) : 75.3 Heparin Dosing Weight: 80.3 kg   Vital Signs: Temp: 97.6 F (36.4 C) (12/18 0711) Temp Source: Axillary (12/18 0711) BP: 113/67 (12/17 2200) Pulse Rate: 78 (12/18 0730)  Labs: Recent Labs    11/11/21 0745 11/11/21 1542 11/11/21 1755 11/12/21 0354 11/12/21 0355 11/12/21 1612 11/13/21 0424 11/13/21 0425  HGB  --   --  7.7* 8.3*  --   --  7.8*  --   HCT  --   --  22.9* 24.4*  --   --  24.4*  --   PLT  --   --  133* 168  --   --  188  --   APTT 58*  --   --   --   --   --   --   --   HEPARINUNFRC  --   --  0.34  --  0.38  --   --  0.48  CREATININE  --    < >  --  2.01*  --  2.14* 1.94*  --    < > = values in this interval not displayed.     Estimated Creatinine Clearance: 37.5 mL/min (A) (by C-G formula based on SCr of 1.94 mg/dL (H)).  Assessment: 65 y.o. male with Afib to continue on IV heparin.  Heparin level therapeutic; no overt bleeding reported.  Hemoglobin downtrending; platelet count improving.  Goal of Therapy:  Heparin level 0.3-0.7 units/ml Monitor platelets by anticoagulation protocol: Yes  Plan:  Continue heparin gtt at 1000 units/hr Daily heparin level and CBC Monitor closely for bleeding  Johnson [place] D. 01-03-1985, PharmD, BCPS, BCCCP 11/13/2021, 10:58 AM

## 2021-11-14 ENCOUNTER — Inpatient Hospital Stay (HOSPITAL_COMMUNITY): Payer: Medicare HMO

## 2021-11-14 DIAGNOSIS — A419 Sepsis, unspecified organism: Secondary | ICD-10-CM

## 2021-11-14 DIAGNOSIS — Z66 Do not resuscitate: Secondary | ICD-10-CM

## 2021-11-14 DIAGNOSIS — R6521 Severe sepsis with septic shock: Secondary | ICD-10-CM

## 2021-11-14 DIAGNOSIS — J156 Pneumonia due to other aerobic Gram-negative bacteria: Secondary | ICD-10-CM

## 2021-11-14 DIAGNOSIS — J189 Pneumonia, unspecified organism: Secondary | ICD-10-CM | POA: Diagnosis not present

## 2021-11-14 DIAGNOSIS — Z515 Encounter for palliative care: Secondary | ICD-10-CM

## 2021-11-14 DIAGNOSIS — J9601 Acute respiratory failure with hypoxia: Secondary | ICD-10-CM | POA: Diagnosis not present

## 2021-11-14 DIAGNOSIS — A498 Other bacterial infections of unspecified site: Secondary | ICD-10-CM

## 2021-11-14 DIAGNOSIS — Z9911 Dependence on respirator [ventilator] status: Secondary | ICD-10-CM

## 2021-11-14 DIAGNOSIS — J9602 Acute respiratory failure with hypercapnia: Secondary | ICD-10-CM | POA: Diagnosis not present

## 2021-11-14 DIAGNOSIS — I517 Cardiomegaly: Secondary | ICD-10-CM | POA: Diagnosis not present

## 2021-11-14 LAB — RENAL FUNCTION PANEL
Albumin: 1.5 g/dL — ABNORMAL LOW (ref 3.5–5.0)
Anion gap: 13 (ref 5–15)
BUN: 66 mg/dL — ABNORMAL HIGH (ref 8–23)
CO2: 18 mmol/L — ABNORMAL LOW (ref 22–32)
Calcium: 7.8 mg/dL — ABNORMAL LOW (ref 8.9–10.3)
Chloride: 99 mmol/L (ref 98–111)
Creatinine, Ser: 1.81 mg/dL — ABNORMAL HIGH (ref 0.61–1.24)
GFR, Estimated: 40 mL/min — ABNORMAL LOW (ref >60)
Glucose, Bld: 83 mg/dL (ref 70–99)
Phosphorus: 5.4 mg/dL — ABNORMAL HIGH (ref 2.5–4.6)
Potassium: 4.1 mmol/L (ref 3.5–5.1)
Sodium: 130 mmol/L — ABNORMAL LOW (ref 135–145)

## 2021-11-14 LAB — CBC
HCT: 23.5 % — ABNORMAL LOW (ref 39.0–52.0)
Hemoglobin: 7.5 g/dL — ABNORMAL LOW (ref 13.0–17.0)
MCH: 29.6 pg (ref 26.0–34.0)
MCHC: 31.9 g/dL (ref 30.0–36.0)
MCV: 92.9 fL (ref 80.0–100.0)
Platelets: 164 10*3/uL (ref 150–400)
RBC: 2.53 MIL/uL — ABNORMAL LOW (ref 4.22–5.81)
RDW: 19.6 % — ABNORMAL HIGH (ref 11.5–15.5)
WBC: 51.4 10*3/uL (ref 4.0–10.5)
nRBC: 13 % — ABNORMAL HIGH (ref 0.0–0.2)

## 2021-11-14 LAB — GLUCOSE, CAPILLARY
Glucose-Capillary: 82 mg/dL (ref 70–99)
Glucose-Capillary: 152 mg/dL — ABNORMAL HIGH (ref 70–99)
Glucose-Capillary: 95 mg/dL (ref 70–99)

## 2021-11-14 LAB — HEPARIN LEVEL (UNFRACTIONATED): Heparin Unfractionated: 0.31 IU/mL (ref 0.30–0.70)

## 2021-11-14 LAB — MAGNESIUM: Magnesium: 2.9 mg/dL — ABNORMAL HIGH (ref 1.7–2.4)

## 2021-11-14 MED ORDER — GLYCOPYRROLATE 1 MG PO TABS: 1.0000 mg | ORAL_TABLET | ORAL | Status: DC | PRN

## 2021-11-14 MED ORDER — LORAZEPAM 2 MG/ML IJ SOLN
2.0000 mg | INTRAMUSCULAR | Status: DC | PRN
  Administered 2021-11-14: 13:00:00 2 mg via INTRAVENOUS
  Filled 2021-11-14: qty 1

## 2021-11-14 MED ORDER — GLYCOPYRROLATE 0.2 MG/ML IJ SOLN
0.2000 mg | INTRAMUSCULAR | Status: DC | PRN
  Administered 2021-11-14: 13:00:00 0.2 mg via INTRAVENOUS
  Filled 2021-11-14: qty 1

## 2021-11-14 MED ORDER — GLYCOPYRROLATE 0.2 MG/ML IJ SOLN: 0.2000 mg | INTRAMUSCULAR | Status: DC | PRN

## 2021-11-14 MED ORDER — DIPHENHYDRAMINE HCL 50 MG/ML IJ SOLN: 25.0000 mg | INTRAMUSCULAR | Status: DC | PRN

## 2021-11-14 MED ORDER — SODIUM CHLORIDE 0.9 % IV SOLN
0.0000 mg/h | INTRAVENOUS | Status: DC
  Administered 2021-11-14: 12:00:00 1 mg/h via INTRAVENOUS
  Filled 2021-11-14: qty 5

## 2021-11-14 MED ORDER — ACETAMINOPHEN 160 MG/5ML PO SOLN: 650.0000 mg | ORAL | Status: DC | PRN

## 2021-11-14 MED ORDER — LACTATED RINGERS IV BOLUS
1000.0000 mL | Freq: Once | INTRAVENOUS | Status: AC
  Administered 2021-11-14: 08:00:00 1000 mL via INTRAVENOUS

## 2021-11-14 MED ORDER — HYDROMORPHONE HCL 1 MG/ML IJ SOLN: 1.0000 mg | Freq: Once | INTRAMUSCULAR | Status: DC

## 2021-11-14 MED ORDER — HYDROMORPHONE BOLUS VIA INFUSION
2.0000 mg | INTRAVENOUS | Status: DC | PRN
  Administered 2021-11-14 (×2): 2 mg via INTRAVENOUS
  Filled 2021-11-14: qty 2

## 2021-11-27 NOTE — Progress Notes (Signed)
ANTICOAGULATION CONSULT NOTE  Pharmacy Consult for Heparin Indication: atrial fibrillation   Allergies  Allergen Reactions   Entresto [Sacubitril-Valsartan]     Worsening renal function and weight gain    Patient Measurements: Height: 5\' 11"  (180.3 cm) Weight: 75.5 kg (166 lb 7.2 oz) IBW/kg (Calculated) : 75.3 Heparin Dosing Weight: 80.3 kg   Vital Signs: Temp: 97.2 F (36.2 C) (12/19 0709) Temp Source: Axillary (12/19 0709) BP: 107/41 (12/19 0730) Pulse Rate: 87 (12/19 0900)  Labs: Recent Labs    11/12/21 0354 11/12/21 0355 11/12/21 1612 11/13/21 0424 11/13/21 0425 11/08/2021 0348  HGB 8.3*  --   --  7.8*  --  7.5*  HCT 24.4*  --   --  24.4*  --  23.5*  PLT 168  --   --  188  --  164  HEPARINUNFRC  --  0.38  --   --  0.48 0.31  CREATININE 2.01*  --  2.14* 1.94*  --  1.81*     Estimated Creatinine Clearance: 42.2 mL/min (A) (by C-G formula based on SCr of 1.81 mg/dL (H)).  Assessment: 66 y.o. male with Afib to continue on IV heparin.  Heparin level therapeutic; no overt bleeding reported.  Hemoglobin downtrending; platelet count improving.  Heparin level 0.31 (on 1000 units/hr)  Goal of Therapy:  Heparin level 0.3-0.7 units/ml Monitor platelets by anticoagulation protocol: Yes  Plan:  Continue heparin gtt at 1000 units/hr Daily heparin level and CBC Monitor closely for bleeding  Thank you for allowing pharmacy to be a part of this patients care.  77, PharmD Clinical Pharmacist  Please check AMION for all [facility] Pharmacy numbers After 10:00 PM, call Main Pharmacy [phone]

## 2021-11-27 NOTE — Progress Notes (Addendum)
This chaplain responded to [facility] referral for EOL spiritual care.    The chaplain joined the Pt. and family for storytelling and prayer as hymns played in the background.  This chaplain understands the Pt. 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   The family tearfully joined the chaplain in prayer. The Pt. died as the family finished the prayer.  Chaplain Stephanie Acre

## 2021-11-27 NOTE — Progress Notes (Incomplete)
Wasted 75 ml Diluadid with Jose R., RN in medication room in stericycle bin.  Darrold Span

## 2021-11-27 NOTE — Significant Event (Signed)
Met with family - 2 sisters, brother, sister in law. Discussed he is dying. Despite aggressive medical treatment and days on life support, he is doing worse. Given that it is likely going to end in his death despite whatever we do, I recommend comfort care. This was guided by medical prognosis but also what he values. He will likely never live independently again - likely will be in LTAC given need for trach/dialysis if he were to survive to hospital discharge. They state this is not acceptable quality of life even in the unlikely event he were to survive. They agree to comfort care. New orders placed.

## 2021-11-27 NOTE — Death Summary Note (Signed)
DEATH SUMMARY   Patient Details  Name: Joseph [name] MRN: [id number] DOB: Dec 30, 1955  Admission/Discharge Information   Admit Date:  2021-10-28  Date of Death: Date of Death: 11-18-21  Time of Death: Time of Death: Mar 08, 1328  Length of Stay: 2023-03-22  Referring Physician: [name], Elige Radon, MD   Reason(s) for Hospitalization  Acute Hypoxemic Respiratory Failure due to Flu A  Diagnoses  Preliminary cause of death:  Secondary Diagnoses (including complications and co-morbidities):  Principal Problem:   Acute respiratory failure with hypoxia and hypercapnia (HCC) Active Problems:   Cardiomyopathy, ischemic   Automatic implantable cardioverter-defibrillator in situ   Chronic systolic heart failure (HCC)   Hx of CABG   Hypertension associated with diabetes (HCC)   Controlled diabetes mellitus with stage 3 chronic kidney disease (HCC)   Influenza A   COPD with acute exacerbation (HCC)   Acute renal failure (ARF) (HCC)   Pneumonia   Pressure injury of skin   Atrial fibrillation with RVR (HCC)   Malnutrition of moderate degree   Burkholderia cepacia pneumonia (HCC)   Ventilator dependent (HCC)   Septic shock (HCC)   DNR (do not resuscitate)   Comfort measures only status   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 year old male who was admitted with influenza A hypoxemic respiratory failure requiring intubation. Course c/b septic shock and renal failure requiring CRRT. Unable to wean from ventilator. Developed worsening shock with fever later in course. Tracheal aspirate and blood cultures with Burkholderia. Despite aggressive antibiotics he worsened. Discussed he was actively dying despite multiple forms of life support. They agreed to comfort measures. He passed shortly after removal of support surrounded by loved  ones.    Pertinent Labs and Studies  Significant Diagnostic Studies CT CHEST WO CONTRAST  Result Date: 28-Oct-2021 CLINICAL DATA:  Respiratory failure with hypoxia, hypercapnia, influenza infection and elevated creatinine. EXAM: CT CHEST WITHOUT CONTRAST TECHNIQUE: Multidetector CT imaging of the chest was performed following the standard protocol without IV contrast. COMPARISON:  Portable chest today, portable chest 08/23/2021, and CTA chest 03/12/2010. FINDINGS: Cardiovascular: Mild cardiomegaly with left chamber predominance is again noted with pacemaker wiring in the heart and left chest battery again causing streak artifact. Native coronary arteries are heavily calcified with again noted old CABG and healed sternotomy changes. There is no pericardial effusion. There is mild distention of the superior pulmonary veins compared to the prior CTA, and interval increased slight prominence of the pulmonary trunk measuring 3.1 cm suggesting arterial hypertension. There is patchy calcification in the aorta and great vessels but no aortic aneurysm. Mediastinum/Nodes: No enlarged mediastinal or axillary lymph nodes. Thyroid gland, trachea, and esophagus demonstrate no significant findings. There is a small to moderate hiatal hernia again noted however, which contains mostly fat. Lungs/Pleura: There are patchy ground-glass opacities in the upper lobes which predominate in the peripheral lungs, nonlocalizing faint ground-glass opacities in the infrahilar lower lobes, and increased opacity in the posterior lower lobes alongside small to moderate-sized pleural effusions which could be atelectasis or consolidation. There is interstitial prominence in the lung apices and bases and in the lower zonal parahilar regions, with a distribution most suggestive of interstitial edema. The central airways show no focal filling defects but do show bronchial thickening in the lower lobes. Upper Abdomen: No acute abnormality.  Old  cholecystectomy. Musculoskeletal: No chest  wall mass or suspicious bone lesions identified. IMPRESSION: 1. Cardiomegaly with interstitial edema and small to moderate symmetric layering pleural effusions consistent with CHF or fluid overload. 2. Multifocal lung opacities. Opacities alongside the pleural effusions could be atelectasis or consolidation. Peripheral-predominant ground-glass opacities in the upper lobes are probably due to pneumonitis including viral etiologies. Ground-glass central opacities are likely ground-glass edema and there is bronchial thickening in both lower lobes. 3. Calcific CAD with remote CABG and pacing system. 4. Slightly prominent pulmonary trunk but no right heart chamber enlargement. 5. Follow-up study recommended after treatment to ensure clearing of opacities. Electronically Signed   By: Telford Nab M.D.   On: 10/18/2021 23:00   US RENAL  Result Date: 10/29/2021 CLINICAL DATA:  AKI EXAM: RENAL / URINARY TRACT ULTRASOUND COMPLETE COMPARISON:  None. FINDINGS: Right Kidney: Not well visualized with cortical thinning and possibly increased echogenicity. Renal measurements: 9.1 x 4.4 x 4.4 cm = volume: 93.5 mL. No mass or hydronephrosis visualized. Left Kidney: Renal measurements: 10.9 x 5.1 x 4.9 cm = volume: 141.2 mL. Echogenicity within normal limits. No mass or hydronephrosis visualized. Bladder: Partially decompressed by Foley catheter. Other: None. IMPRESSION: No hydronephrosis. The right kidney is not as well visualized with cortical thinning and possibly increased echogenicity suggesting medical renal disease. KA Electronically Signed   By: Macy Mis M.D.   On: 10/29/2021 17:55   DG CHEST PORT 1 VIEW  Result Date: Nov 19, 2021 CLINICAL DATA:  Pneumonia follow-up. EXAM: PORTABLE CHEST 1 VIEW COMPARISON:  Portable chest 11/10/2021. FINDINGS: There is mild cardiomegaly with CABG changes and left chest single lead cardiac assist device, unchanged. Right IJ dialysis  catheter again terminates at about the cavoatrial junction, ETT and enteric tube remain adequately inserted. No vascular congestion is seen. There are persistent interstitial and patchy infiltrates of the right greater than left upper lobes, without improvement or worsening. Remainder of the lungs are clear. No pleural effusion is seen. No pneumothorax. Osteopenia. IMPRESSION: No changes in the overall aeration and upper zone lung opacities, right greater than left. Support devices are unaltered. Electronically Signed   By: Telford Nab M.D.   On: November 19, 2021 05:52   DG Chest Port 1 View  Result Date: 11/10/2021 CLINICAL DATA:  Respiratory distress, history of "pneumonia" EXAM: PORTABLE CHEST 1 VIEW COMPARISON:  November 08, 2021. FINDINGS: Endotracheal tube terminates approximately 4 cm from the carina. Gastric tube courses through in off the field of the radiograph. RIGHT-sided dialysis catheter terminates in the area of the upper RIGHT atrium, partially obscured by overlying EKG leads which project over the patient's chest in various locations. LEFT-sided pacer defibrillator single lead remains in place. Cardiomediastinal contours are stable. Persistent upper lobe RIGHT greater than LEFT airspace and interstitial process. Mild retrocardiac added density. Blunting of the LEFT costodiaphragmatic sulcus is subtle. No pneumothorax. On limited assessment there is no acute skeletal process. IMPRESSION: Persistent upper lobe RIGHT greater than LEFT airspace and interstitial process. Mild retrocardiac added density which may reflect atelectasis or developing airspace disease. Overall no substantial change from the previous radiograph. Oxygen tubing or delivery device projecting over the LEFT upper lobe scar. This area on the prior study. Electronically Signed   By: Zetta Bills M.D.   On: 11/10/2021 08:37   DG Chest Port 1 View  Result Date: 11/08/2021 CLINICAL DATA:  Respiratory failure.  Hypoxia.   Hypercapnia. EXAM: PORTABLE CHEST 1 VIEW COMPARISON:  11/06/2021 FINDINGS: Endotracheal tube tip is 3 cm above the carina. Nasogastric tube enters  the abdomen. Right internal jugular central line tip is at the SVC RA junction. 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 There is improved aeration in the left lower lobe. Patchy infiltrate persists in the right upper lobe. IMPRESSION: Lines and tubes in satisfactory position. Improved aeration in the left lower lobe. Persistent patchy infiltrate in the right upper lobe. Electronically Signed   By: Nelson Chimes M.D.   On: 11/08/2021 08:06   DG CHEST PORT 1 VIEW  Result Date: 11/06/2021 CLINICAL DATA:  Respiratory distress. EXAM: PORTABLE CHEST 1 VIEW COMPARISON:  November 04, 2021. FINDINGS: The heart size and mediastinal contours are within normal limits. Endotracheal and nasogastric tubes are unchanged in position. Left-sided defibrillator is unchanged in position. Bilateral internal jugular catheters are unchanged. Stable bilateral upper lobe opacities are noted concerning for pneumonia, right greater than left. Minimal bilateral pleural effusions are noted. The visualized skeletal structures are unremarkable. IMPRESSION: Stable support apparatus. Stable bilateral upper lobe opacities as described above. Electronically Signed   By: Marijo Conception M.D.   On: 11/06/2021 05:27   DG Chest Port 1 View  Result Date: 11/04/2021 CLINICAL DATA:  Acute respiratory failure. EXAM: PORTABLE CHEST 1 VIEW COMPARISON:  Chest x-ray dated November 02, 2021. FINDINGS: Unchanged endotracheal and enteric tubes. Unchanged right and left internal jugular central venous catheters. Unchanged left chest wall pacemaker. Stable cardiomediastinal silhouette status post CABG. Patchy airspace disease in both upper lobes are not significantly changed. Similar layering pleural effusions bilaterally. No pneumothorax. No acute osseous abnormality. IMPRESSION: 1. No significant interval change in  bilateral upper lobe airspace disease and layering pleural effusions. Electronically Signed   By: Titus Dubin M.D.   On: 11/04/2021 07:56   DG CHEST PORT 1 VIEW  Result Date: 11/02/2021 CLINICAL DATA:  Intubation of airway EXAM: PORTABLE CHEST 1 VIEW COMPARISON:  11/02/2021 FINDINGS: Endotracheal tube, right IJ central line, left IJ central line, and enteric tube remain present. Persistent bilateral opacities particularly in the upper lungs. Improved aeration at the lung bases. Likely decreased layering pleural effusions. No pneumothorax. Stable cardiomediastinal contours. Left chest wall ICD. IMPRESSION: Stable lines and tubes. Improved aeration at the lung bases with likely decreased layering pleural effusions. Persistent bilateral upper lung opacities. Electronically Signed   By: Macy Mis M.D.   On: 11/02/2021 13:28   DG CHEST PORT 1 VIEW  Result Date: 11/02/2021 CLINICAL DATA:  Intubated EXAM: PORTABLE CHEST 1 VIEW COMPARISON:  11/01/2021 FINDINGS: No significant change in AP portable chest radiograph. Extensive support apparatus includes endotracheal tube, esophagogastric tube, right neck multi lumen vascular catheter, and left neck single lumen vascular catheter. Status post median sternotomy and CABG. Unchanged diffuse bilateral interstitial and heterogeneous airspace opacity with layering bilateral pleural effusions. No new airspace opacity. IMPRESSION: 1. No significant change in AP portable chest radiograph. 2. Extensive support apparatus, unchanged. 3. Unchanged diffuse bilateral interstitial and heterogeneous airspace opacity with layering bilateral pleural effusions, consistent with edema, infection, and/or ARDS. Electronically Signed   By: Delanna Ahmadi M.D.   On: 11/02/2021 08:39   DG CHEST PORT 1 VIEW  Result Date: 11/01/2021 CLINICAL DATA:  Pulmonary edema EXAM: PORTABLE CHEST 1 VIEW COMPARISON:  Chest radiograph 1 day prior FINDINGS: The endotracheal tube tip is approximately  4.9 cm from the carina. A right IJ vascular catheter terminates at the level of the cavoatrial junction. A left IJ catheter terminates in the region of the upper SVC. The left chest wall cardiac device and associated leads are stable. Median sternotomy wires  and mediastinal surgical clips are stable. The enteric catheter tip is off the field of view. The cardiomediastinal silhouette is stable. There are confluent opacities throughout both lungs emanating from the hilar regions, overall slightly improved in the interim. The right costophrenic angle is cut off. There is no significant left effusion. There is no appreciable pneumothorax. IMPRESSION: 1. Support devices as above. 2. Extensive opacities throughout both lungs emanating from the hilar regions, overall slightly improved in the interim. Findings may reflect improving edema, infection, or ARDS. Electronically Signed   By: Valetta Mole M.D.   On: 11/01/2021 10:43   DG CHEST PORT 1 VIEW  Result Date: 10/31/2021 CLINICAL DATA:  Shortness of breath EXAM: PORTABLE CHEST 1 VIEW COMPARISON:  Previous studies including the examination of 10/30/2021 FINDINGS: Transverse diameter of heart is increased. Central pulmonary vessels are more prominent. Increased interstitial and alveolar densities are seen in the both parahilar regions and lower lung fields, more so on the right side. There is blunting of right lateral costophrenic angle suggesting pleural effusion with interval increase in size. There is no pneumothorax. Tip of endotracheal tube is approximately 7.7 cm above the carina. Tip of dialysis catheter placed through the right jugular vein is seen in the region of right atrium. Enteric tube is noted traversing the esophagus. Tip of left jugular central venous catheter is seen in the region of superior vena cava. IMPRESSION: Cardiomegaly. Increased interstitial and alveolar markings are seen in both lungs, more so on the right side suggesting worsening pulmonary  edema. Possibility of underlying pneumonia is not excluded. There is interval increase in right pleural effusion. Electronically Signed   By: Elmer Picker M.D.   On: 10/31/2021 13:12   DG CHEST PORT 1 VIEW  Result Date: 10/29/2021 CLINICAL DATA:  Acute respiratory failure.  Central line placement. EXAM: PORTABLE CHEST 1 VIEW COMPARISON:  Chest x-ray from earlier same day. FINDINGS: Interval placement of a LEFT IJ central line with tip positioned at the level of the upper SVC. Endotracheal tube remains adequately positioned with tip at the level the clavicles. Enteric tube passes below the diaphragm. Diffuse bilateral airspace opacities are not significantly changed in the short-term interval, perhaps slightly increased in density compared to the earlier chest x-ray of 10/28/2021. No pneumothorax is seen. Heart size and mediastinal contours are grossly stable. LEFT chest wall pacemaker/ICD apparatus in place. IMPRESSION: 1. Interval placement of a LEFT IJ central line with tip positioned at the level of the upper SVC. No pneumothorax seen. 2. Diffuse bilateral airspace opacities, not significantly changed, compatible with multifocal pneumonia versus pulmonary edema/ARDS. Electronically Signed   By: Franki Cabot M.D.   On: 10/29/2021 15:49   DG CHEST PORT 1 VIEW  Result Date: 10/29/2021 CLINICAL DATA:  Acute respiratory failure EXAM: PORTABLE CHEST 1 VIEW COMPARISON:  10/28/2021 FINDINGS: Endotracheal and enteric tubes are again identified. Persistent bilateral pulmonary opacities with perihilar and lower lung predominance. Aeration is not substantially changed. Stable cardiomediastinal contours. Left chest wall ICD. Probable small left pleural effusion remains present. IMPRESSION: Persistent bilateral pulmonary opacities likely reflecting edema and/or pneumonia. Aeration is similar. Probable small left pleural effusion. Electronically Signed   By: Macy Mis M.D.   On: 10/29/2021 09:42    Portable Chest x-ray  Result Date: 10/28/2021 CLINICAL DATA:  66 year old male, assess ET tube placement. EXAM: PORTABLE CHEST - 1 VIEW COMPARISON:  Earlier the same day FINDINGS: The mediastinal contours are within normal limits. Similar appearing postsurgical changes after coronary artery bypass  graft. Unchanged position of endotracheal tube distal tip in the midthoracic trachea. Gastric decompression tube tip terminates off the inferior aspect of this image. Unchanged appearance of left chest wall single lead pacemaker/AICD. No cardiomegaly. Similar appearing diffuse basal predominant patchy pulmonary opacities. Likely trace bilateral pleural effusions. No pneumothorax. No acute osseous abnormality. IMPRESSION: 1. Unchanged, appropriate position of endotracheal tube. 2. Unchanged basal predominant diffuse pulmonary opacities. Electronically Signed   By: Ruthann Cancer M.D.   On: 10/28/2021 16:48   DG CHEST PORT 1 VIEW  Result Date: 10/28/2021 CLINICAL DATA:  Respiratory distress. EXAM: PORTABLE CHEST 1 VIEW COMPARISON:  Same day. FINDINGS: Stable cardiomediastinal silhouette. Status post coronary bypass graft. Endotracheal tube appears to be in grossly good position. Stable position of left-sided pacemaker is noted. Stable bilateral lung opacities are noted. Bony thorax is unremarkable. IMPRESSION: Endotracheal tube in grossly good position. Stable bilateral lung opacities. Electronically Signed   By: Marijo Conception M.D.   On: 10/28/2021 10:36   DG Chest Port 1 View  Result Date: 10/28/2021 CLINICAL DATA:  Respiratory distress. EXAM: PORTABLE CHEST 1 VIEW COMPARISON:  October 27, 2021 (10:13 a.m.) FINDINGS: Multiple sternal wires and vascular clips are seen. A single lead ventricular pacer is noted. Marked severity bilateral multifocal airspace disease is seen. This is increased in severity when compared to the prior study and slightly more prominent within the bilateral lower lobes. Mild layering  bilateral pleural effusions are seen. No pneumothorax is identified. The heart size and mediastinal contours are within normal limits. The visualized skeletal structures are unremarkable. IMPRESSION: 1. Marked severity bilateral multifocal airspace disease, increased in severity when compared to the prior study. 2. Stable, partially layering bilateral pleural effusions. Electronically Signed   By: Virgina Norfolk M.D.   On: 10/28/2021 00:35   DG Chest Port 1 View  Result Date: 10/27/2021 CLINICAL DATA:  COPD, pneumonia EXAM: PORTABLE CHEST 1 VIEW COMPARISON:  Chest radiograph dated October 25, 2021. FINDINGS: The heart is normal in size. Evidence of prior coronary artery bypass grafting. Left subclavian access AICD with distal tip in the right ventricle. There are bilateral lower lobe hazy opacities concerning for pleural effusion/infiltrate, unchanged. No appreciable pneumothorax. IMPRESSION: 1. Bilateral lower lobe hazy opacities concerning for airspace disease and/or pleural effusions, not significantly changed. 2. Cardiomediastinal silhouette is within normal limits with evidence of prior coronary artery bypass grafting. Electronically Signed   By: Keane Police D.O.   On: 10/27/2021 13:26   DG CHEST PORT 1 VIEW  Result Date: 10/25/2021 CLINICAL DATA:  Shortness of breath.  History of COPD. EXAM: PORTABLE CHEST 1 VIEW COMPARISON:  Chest x-ray 10/17/2021.  CT chest 10/17/2021. FINDINGS: There are increasing patchy airspace opacities in the lower lungs bilaterally. Patchy slightly nodular opacities in the mid lungs persists, unchanged from prior CT. Small pleural effusions are present. There is no pneumothorax. The cardiomediastinal silhouette is stable, the heart is enlarged. Patient is status post cardiac surgery. Left-sided pacemaker is present. There are no acute fractures. IMPRESSION: 1. Increasing bilateral airspace disease in the lower lobes. 2. Stable small pleural effusions. Electronically  Signed   By: Ronney Asters M.D.   On: 10/25/2021 19:27   DG Chest Portable 1 View  Result Date: 10/19/2021 CLINICAL DATA:  Respiratory distress. EXAM: PORTABLE CHEST 1 VIEW COMPARISON:  Chest x-rays dated 08/23/2021 and 05/03/2021. FINDINGS: Mildly increased interstitial markings bilaterally compared to the earlier exams, suggesting acute interstitial edema/thickening superimposed on chronic interstitial lung disease/fibrosis. No confluent opacity to suggest  a consolidating pneumonia. No pleural effusion or pneumothorax is seen. Heart size and mediastinal contours are stable. LEFT chest wall pacemaker/ICD apparatus appears stable in position. Osseous structures about the chest are unremarkable. IMPRESSION: New interstitial prominence bilaterally indicating mild volume overload versus multifocal atypical pneumonia, superimposed on chronic interstitial lung disease/fibrosis. Electronically Signed   By: Franki Cabot M.D.   On: 09/30/2021 19:08   DG Chest Port 1V same Day  Result Date: 10/30/2021 CLINICAL DATA:  Respiratory failure EXAM: PORTABLE CHEST 1 VIEW COMPARISON:  10/29/2021 FINDINGS: Endotracheal tube is seen 5.3 cm above the carina. Nasogastric tube extends into the upper abdomen beyond the margin of the examination. Left internal jugular central venous catheter tip noted within the superior vena caval confluence. Pulmonary insufflation is stable of the lungs are symmetrically well expanded. Superimposed extensive patchy airspace and interstitial infiltrate is stable. Small left pleural effusion is unchanged. No pneumothorax. Coronary artery bypass grafting has been performed. Cardiac size within normal limits. 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 No acute bone abnormality. IMPRESSION: Stable support lines and tubes. Stable pulmonary insufflation. Stable extensive multifocal pulmonary infiltrate, edema versus infection. Stable small left pleural effusion. Electronically Signed   By:  Fidela Salisbury M.D.   On: 10/30/2021 20:40   DG Abd Portable 1V  Result Date: 10/28/2021 CLINICAL DATA:  OG tube placement EXAM: PORTABLE ABDOMEN - 1 VIEW COMPARISON:  Concurrently obtained chest x-ray; prior abdominal radiograph from earlier today at 1:16 p.m. FINDINGS: A gastric tube has been placed. The tip of the tube overlies the left upper quadrant presumably within the stomach. Multifocal opacities throughout the visualized lungs. Patient is status post median sternotomy with evidence of prior multivessel CABG. An intracardiac defibrillator lead projects over the right heart. IMPRESSION: Successful placement of gastric tube which overlies the stomach. Electronically Signed   By: Jacqulynn Cadet M.D.   On: 10/28/2021 16:43   DG Abd Portable 1V  Result Date: 10/28/2021 CLINICAL DATA:  Orogastric tube placement EXAM: PORTABLE ABDOMEN - 1 VIEW COMPARISON:  CT abdomen 03/12/2010 FINDINGS: No orogastric tube is identified over the upper abdomen. Because of this, we image the chest, and the orogastric tube is not visible in the chest either. There is a endotracheal tube which sits about 6.7 cm above the carina. AICD noted. Prior CABG. Patchy bilateral perihilar airspace opacities. Please note that today's image of the chest with shot with parameters favoring visualization of abdominal structures and is not a dedicated chest radiograph. Upper abdominal bowel gas pattern unremarkable. Lower lumbar spondylosis. IMPRESSION: 1. No orogastric tube is visible over the chest or abdomen. 2. Substantial bilateral perihilar airspace opacities in the chest. Electronically Signed   By: Van Clines M.D.   On: 10/28/2021 15:56   ECHOCARDIOGRAM COMPLETE  Result Date: 10/25/2021    ECHOCARDIOGRAM REPORT   Patient Name:   DARIEL CARAKER Date of Exam: 10/25/2021 Medical Rec #:  [id number]     Height:       71.0 in Accession #:    [id number]    Weight:       177.5 lb Date of Birth:  08-04-1956      BSA:          2.004  m Patient Age:    66 years      BP:           103/54 mmHg Patient Gender: M             HR:  69 bpm. Exam Location:  Inpatient Procedure: 2D Echo, 3D Echo, Cardiac Doppler and Color Doppler Indications:    R07.9* Chest pain, unspecified. Elevated troponin.  History:        Patient has prior history of Echocardiogram examinations, most                 recent 01/28/2019. Cardiomyopathy and CHF, CAD, Prior CABG,                 Defibrillator and Abnormal ECG, COPD; Risk Factors:Hypertension.                 Patient is flu positive.  Sonographer:    Roseanna Rainbow RDCS Referring Phys: [id number] Leanne Chang [facility]  Sonographer Comments: Technically difficult study due to poor echo windows. Pateient coughing throughout test. IMPRESSIONS  1. Left ventricular ejection fraction, by estimation, is 35 to 40%. The left ventricle has moderately decreased function. The left ventricle demonstrates regional wall motion abnormalities (see scoring diagram/findings for description). The mid-to-apical anteroseptal LV wall segments appear akinetic. The rest of the LV segments appear mildly-to-moderately hypokinetic. The left ventricular internal cavity size was mildly dilated. Indeterminate diastolic filling due to E-A fusion.  2. Right ventricular systolic function is normal. The right ventricular size is normal. Tricuspid regurgitation signal is inadequate for assessing PA pressure.  3. 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 The aortic valve is tricuspid. There is mild calcification of the aortic valve. There is mild thickening of the aortic valve. Aortic valve regurgitation is not visualized.  5. The inferior vena cava is dilated in size with >50% respiratory variability, suggesting right atrial pressure of 8 mmHg.  6. Left atrial size was severely dilated.  7. Right atrial size was moderately dilated. Comparison(s): Compared to prior TTE in  01/2019, the LVEF appears mildly improved to 35-40% (previously 30-35%). The mitral regurgitation now appears moderate (previously mild). FINDINGS  Left Ventricle: Left ventricular ejection fraction, by estimation, is 35 to 40%. The left ventricle has moderately decreased function. The left ventricle demonstrates regional wall motion abnormalities. The mid-to-apical anteroseptal LV wall segments appear akinetic. The rest of the LV segments are mildly-to-moderately hypokinetic. 3D left ventricular ejection fraction analysis performed but not reported based on interpreter judgement due to suboptimal tracking. The left ventricular internal cavity size was mildly dilated. There is no left ventricular hypertrophy. Indeterminate diastolic filling due to E-A fusion. Right Ventricle: The right ventricular size is normal. No increase in right ventricular wall thickness. Right ventricular systolic function is normal. Tricuspid regurgitation signal is inadequate for assessing PA pressure. Left Atrium: Left atrial size was severely dilated. Right Atrium: Right atrial size was moderately dilated. Pericardium: There is no evidence of pericardial effusion. Mitral Valve: The mitral valve is normal in structure. There is mild thickening of the mitral valve leaflet(s). There is mild calcification of the mitral valve leaflet(s). Mild mitral annular calcification. There is tethering of the posterior mitral valve leafltet with resultant moderate, posteriorly directed mitral regurgitation. MV peak gradient, 10.8 mmHg. The mean mitral valve gradient is 3.0 mmHg. Tricuspid Valve: The tricuspid valve is normal in structure. Tricuspid valve regurgitation is trivial. Aortic Valve: The aortic valve is tricuspid. There is mild calcification of the aortic valve. There is mild thickening of the aortic valve. Aortic valve regurgitation is not visualized. Pulmonic Valve: The pulmonic valve was normal in structure. Pulmonic valve regurgitation is  trivial. Aorta: The aortic root and  ascending aorta are structurally normal, with no evidence of dilitation. Venous: The inferior vena cava is dilated in size with greater than 50% respiratory variability, suggesting right atrial pressure of 8 mmHg. IAS/Shunts: No atrial level shunt detected by color flow Doppler. Additional Comments: A device lead is visualized.  LEFT VENTRICLE PLAX 2D LVIDd:         3.31 cm      Diastology LVIDs:         2.91 cm      LV e' medial:    7.62 cm/s LV PW:         3.62 cm      LV E/e' medial:  17.1 LV IVS:        0.66 cm      LV e' lateral:   12.40 cm/s LVOT diam:     1.90 cm      LV E/e' lateral: 10.5 LV SV:         46 LV SV Index:   23 LVOT Area:     2.84 cm                              3D Volume EF: LV Volumes (MOD)            3D EF:        35 % LV vol d, MOD A2C: 132.0 ml LV EDV:       207 ml LV vol d, MOD A4C: 133.0 ml LV ESV:       135 ml LV vol s, MOD A2C: 69.6 ml  LV SV:        71 ml LV vol s, MOD A4C: 85.8 ml LV SV MOD A2C:     62.4 ml LV SV MOD A4C:     133.0 ml LV SV MOD BP:      61.6 ml RIGHT VENTRICLE             IVC RV S prime:     11.30 cm/s  IVC diam: 2.20 cm TAPSE (M-mode): 1.1 cm LEFT ATRIUM             Index        RIGHT ATRIUM           Index LA diam:        3.60 cm 1.80 cm/m   RA Area:     12.50 cm LA Vol (A2C):   57.4 ml 28.64 ml/m  RA Volume:   25.30 ml  12.62 ml/m LA Vol (A4C):   54.4 ml 27.14 ml/m LA Biplane Vol: 57.4 ml 28.64 ml/m  AORTIC VALVE LVOT Vmax:   120.00 cm/s LVOT Vmean:  78.400 cm/s LVOT VTI:    0.162 m  AORTA Ao Root diam: 3.30 cm Ao Asc diam:  3.30 cm MITRAL VALVE MV Area (PHT): 2.83 cm       SHUNTS MV Area VTI:   1.71 cm       Systemic VTI:  0.16 m MV Peak grad:  10.8 mmHg      Systemic Diam: 1.90 cm MV Mean grad:  3.0 mmHg MV Vmax:       1.64 m/s MV Vmean:      75.0 cm/s MV Decel Time: 268 msec MR Peak grad:    58.7 mmHg MR Mean grad:    33.0 mmHg MR Vmax:         383.00 cm/s MR Vmean:  269.0 cm/s MR PISA:         1.57 cm MR PISA  Eff ROA: 16 mm MR PISA Radius:  0.50 cm MV E velocity: 130.00 cm/s Gwyndolyn Kaufman MD Electronically signed by Gwyndolyn Kaufman MD Signature Date/Time: 10/25/2021/9:14:44 PM    Final    VAS [place] LOWER EXTREMITY VENOUS (DVT)  Result Date: 11/07/2021  Lower Venous DVT Study Patient Name:  KOHNER KOWALSKY  Date of Exam:   11/07/2021 Medical Rec #: [id number]      Accession #:    [id number] Date of Birth: [birth year]       Patient Gender: M Patient Age:   56 years Exam Location:  [facility] Procedure:      VAS [place] LOWER EXTREMITY VENOUS (DVT) Referring Phys: CHI ELLISON --------------------------------------------------------------------------------  Indications: Fever.  Comparison Study: no prior Performing Technologist: Archie Patten RVS  Examination Guidelines: A complete evaluation includes B-mode imaging, spectral Doppler, color Doppler, and power Doppler as needed of all accessible portions of each vessel. Bilateral testing is considered an integral part of a complete examination. Limited examinations for reoccurring indications may be performed as noted. The reflux portion of the exam is performed with the patient in reverse Trendelenburg.  +---------+---------------+---------+-----------+----------+--------------+  RIGHT     Compressibility Phasicity Spontaneity Properties Thrombus Aging  +---------+---------------+---------+-----------+----------+--------------+  CFV       Full            Yes       Yes                                    +---------+---------------+---------+-----------+----------+--------------+  SFJ       Full                                                             +---------+---------------+---------+-----------+----------+--------------+  FV Prox   Full                                                             +---------+---------------+---------+-----------+----------+--------------+  FV Mid    Full                                                              +---------+---------------+---------+-----------+----------+--------------+  FV Distal Full                                                             +---------+---------------+---------+-----------+----------+--------------+  PFV       Full                                                             +---------+---------------+---------+-----------+----------+--------------+  POP       Full            Yes       Yes                                    +---------+---------------+---------+-----------+----------+--------------+  PTV       Full                                                             +---------+---------------+---------+-----------+----------+--------------+  PERO      Full                                                             +---------+---------------+---------+-----------+----------+--------------+   +---------+---------------+---------+-----------+----------+--------------+  LEFT      Compressibility Phasicity Spontaneity Properties Thrombus Aging  +---------+---------------+---------+-----------+----------+--------------+  CFV       Full            Yes       Yes                                    +---------+---------------+---------+-----------+----------+--------------+  SFJ       Full                                                             +---------+---------------+---------+-----------+----------+--------------+  FV Prox   Full                                                             +---------+---------------+---------+-----------+----------+--------------+  FV Mid    Full                                                             +---------+---------------+---------+-----------+----------+--------------+  FV Distal Full                                                             +---------+---------------+---------+-----------+----------+--------------+  PFV       Full                                                              +---------+---------------+---------+-----------+----------+--------------+  POP       Full            Yes       Yes                                    +---------+---------------+---------+-----------+----------+--------------+  PTV       Full                                                             +---------+---------------+---------+-----------+----------+--------------+  PERO      Full                                                             +---------+---------------+---------+-----------+----------+--------------+     Summary: BILATERAL: - No evidence of deep vein thrombosis seen in the lower extremities, bilaterally. -No evidence of popliteal cyst, bilaterally.   *See table(s) above for measurements and observations. Electronically signed by Servando Snare MD on 11/07/2021 at 4:20:30 PM.    Final    VAS [place] UPPER EXTREMITY VENOUS DUPLEX  Result Date: 11/07/2021 UPPER VENOUS STUDY  Patient Name:  DAMONTAY SWAGERTY  Date of Exam:   11/07/2021 Medical Rec #: [id number]      Accession #:    [id number] Date of Birth: May 09, 1956       Patient Gender: M Patient Age:   66 years Exam Location:  [facility] Procedure:      VAS [place] UPPER EXTREMITY VENOUS DUPLEX Referring Phys: CHI ELLISON --------------------------------------------------------------------------------  Indications: fever Comparison Study: no prior Performing Technologist: Archie Patten RVS  Examination Guidelines: A complete evaluation includes B-mode imaging, spectral Doppler, color Doppler, and power Doppler as needed of all accessible portions of each vessel. Bilateral testing is considered an integral part of a complete examination. Limited examinations for reoccurring indications may be performed as noted.  Right Findings: +----------+------------+---------+-----------+----------+-----------------+  RIGHT      Compressible Phasicity Spontaneous Properties      Summary        +----------+------------+---------+-----------+----------+-----------------+  IJV            Full        Yes        Yes                                   +----------+------------+---------+-----------+----------+-----------------+  Subclavian     Full        Yes        Yes                                   +----------+------------+---------+-----------+----------+-----------------+  Axillary       Full        Yes        Yes                                   +----------+------------+---------+-----------+----------+-----------------+  Brachial       Full        Yes        Yes                                   +----------+------------+---------+-----------+----------+-----------------+  Radial         Full                                                         +----------+------------+---------+-----------+----------+-----------------+  Ulnar          Full                                                         +----------+------------+---------+-----------+----------+-----------------+  Cephalic       None                                      Age Indeterminate  +----------+------------+---------+-----------+----------+-----------------+  Basilic        Full                                                         +----------+------------+---------+-----------+----------+-----------------+  Left Findings: +----------+------------+---------+-----------+----------+-------+  LEFT       Compressible Phasicity Spontaneous Properties Summary  +----------+------------+---------+-----------+----------+-------+  IJV            Full        Yes        Yes                         +----------+------------+---------+-----------+----------+-------+  Subclavian     Full        Yes        Yes                         +----------+------------+---------+-----------+----------+-------+  Axillary       Full        Yes        Yes                         +----------+------------+---------+-----------+----------+-------+  Brachial       Full         Yes        Yes                         +----------+------------+---------+-----------+----------+-------+  Radial         Full                                               +----------+------------+---------+-----------+----------+-------+  Ulnar  Full                                               +----------+------------+---------+-----------+----------+-------+  Cephalic       Full                                               +----------+------------+---------+-----------+----------+-------+  Basilic        Full                                               +----------+------------+---------+-----------+----------+-------+  Summary:  Right: No evidence of deep vein thrombosis in the upper extremity. 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  Left: No evidence of deep vein thrombosis in the upper extremity. No evidence of superficial vein thrombosis in the upper extremity.  *See table(s) above for measurements and observations.  Diagnosing physician: Servando Snare MD Electronically signed by Servando Snare MD on 11/07/2021 at 4:20:44 PM.    Final    [place] EKG SITE RITE  Result Date: 10/26/2021 If Site Rite image not attached, placement could not be confirmed due to current cardiac rhythm.   Microbiology Recent Results (from the past 240 hour(s))  Culture, Respiratory w Gram Stain     Status: None   Collection Time: 11/09/21 10:16 AM   Specimen: Tracheal Aspirate; Respiratory  Result Value Ref Range Status   Specimen Description TRACHEAL ASPIRATE  Final   Special Requests NONE  Final   Gram Stain   Final    ABUNDANT WBC PRESENT,BOTH PMN AND MONONUCLEAR ABUNDANT GRAM NEGATIVE RODS FEW GRAM VARIABLE ROD FEW GRAM POSITIVE COCCI Performed at [facility], [place] [address] [place], [place] [address]    Culture ABUNDANT BURKHOLDERIA CEPACIA  Final   Report Status 11/11/2021 FINAL  Final   Organism ID, Bacteria  BURKHOLDERIA CEPACIA  Final      Susceptibility   Burkholderia cepacia - MIC*    CEFAZOLIN >=64 RESISTANT Resistant     GENTAMICIN >=16 RESISTANT Resistant     CIPROFLOXACIN 1 SENSITIVE Sensitive     IMIPENEM >=16 RESISTANT Resistant     TRIMETH/SULFA <=20 SENSITIVE Sensitive     * ABUNDANT BURKHOLDERIA CEPACIA  Culture, blood (routine x 2)     Status: Abnormal   Collection Time: 11/09/21 10:35 AM   Specimen: BLOOD  Result Value Ref Range Status   Specimen Description BLOOD BLOOD LEFT HAND  Final   Special Requests   Final    BOTTLES DRAWN AEROBIC ONLY Blood Culture results may not be optimal due to an inadequate volume of blood received in culture bottles   Culture  Setup Time   Final    GRAM NEGATIVE RODS AEROBIC BOTTLE ONLY CRITICAL RESULT CALLED TO, READ BACK BY AND VERIFIED WITHJiles Garter [facility] [id number] 11/10/21 A BROWNING Performed at [facility], [place] [address] [place], [place] [address]    Culture BURKHOLDERIA CEPACIA (A)  Final   Report Status 11/12/2021 FINAL  Final   Organism ID, Bacteria BURKHOLDERIA CEPACIA  Final  Susceptibility   Burkholderia cepacia - MIC*    CEFAZOLIN >=64 RESISTANT Resistant     GENTAMICIN >=16 RESISTANT Resistant     CIPROFLOXACIN 1 SENSITIVE Sensitive     IMIPENEM >=16 RESISTANT Resistant     TRIMETH/SULFA <=20 SENSITIVE Sensitive     * BURKHOLDERIA CEPACIA  Blood Culture ID Panel (Reflexed)     Status: None   Collection Time: 11/09/21 10:35 AM  Result Value Ref Range Status   Enterococcus faecalis NOT DETECTED NOT DETECTED Final   Enterococcus Faecium NOT DETECTED NOT DETECTED Final   Listeria monocytogenes NOT DETECTED NOT DETECTED Final   Staphylococcus species NOT DETECTED NOT DETECTED Final   Staphylococcus aureus (BCID) NOT DETECTED NOT DETECTED Final   Staphylococcus epidermidis NOT DETECTED NOT DETECTED Final   Staphylococcus lugdunensis NOT DETECTED NOT DETECTED Final   Streptococcus species NOT DETECTED NOT  DETECTED Final   Streptococcus agalactiae NOT DETECTED NOT DETECTED Final   Streptococcus pneumoniae NOT DETECTED NOT DETECTED Final   Streptococcus pyogenes NOT DETECTED NOT DETECTED Final   A.calcoaceticus-baumannii NOT DETECTED NOT DETECTED Final   Bacteroides fragilis NOT DETECTED NOT DETECTED Final   Enterobacterales NOT DETECTED NOT DETECTED Final   Enterobacter cloacae complex NOT DETECTED NOT DETECTED Final   Escherichia coli NOT DETECTED NOT DETECTED Final   Klebsiella aerogenes NOT DETECTED NOT DETECTED Final   Klebsiella oxytoca NOT DETECTED NOT DETECTED Final   Klebsiella pneumoniae NOT DETECTED NOT DETECTED Final   Proteus species NOT DETECTED NOT DETECTED Final   Salmonella species NOT DETECTED NOT DETECTED Final   Serratia marcescens NOT DETECTED NOT DETECTED Final   Haemophilus influenzae NOT DETECTED NOT DETECTED Final   Neisseria meningitidis NOT DETECTED NOT DETECTED Final   Pseudomonas aeruginosa NOT DETECTED NOT DETECTED Final   Stenotrophomonas maltophilia NOT DETECTED NOT DETECTED Final   Candida albicans NOT DETECTED NOT DETECTED Final   Candida auris NOT DETECTED NOT DETECTED Final   Candida glabrata NOT DETECTED NOT DETECTED Final   Candida krusei NOT DETECTED NOT DETECTED Final   Candida parapsilosis NOT DETECTED NOT DETECTED Final   Candida tropicalis NOT DETECTED NOT DETECTED Final   Cryptococcus neoformans/gattii NOT DETECTED NOT DETECTED Final    Comment: Performed at [facility], [address] [place], [place] [address]  Culture, blood (routine x 2)     Status: Abnormal   Collection Time: 11/09/21 10:51 AM   Specimen: BLOOD  Result Value Ref Range Status   Specimen Description BLOOD BLOOD RIGHT HAND  Final   Special Requests   Final    BOTTLES DRAWN AEROBIC ONLY Blood Culture results may not be optimal due to an inadequate volume of blood received in culture bottles   Culture  Setup Time   Final    GRAM POSITIVE COCCI AEROBIC BOTTLE  ONLY Organism ID to follow CRITICAL RESULT CALLED TO, READ BACK BY AND VERIFIED WITHAndres Shad PHARMD [id number] 11/10/21 A BROWNING    Culture (A)  Final    STAPHYLOCOCCUS EPIDERMIDIS THE SIGNIFICANCE OF ISOLATING THIS ORGANISM FROM A SINGLE SET OF BLOOD CULTURES WHEN MULTIPLE SETS ARE DRAWN IS UNCERTAIN. PLEASE NOTIFY THE MICROBIOLOGY DEPARTMENT WITHIN ONE WEEK IF SPECIATION AND SENSITIVITIES ARE REQUIRED. Performed at [facility], [place] [address] [place], [place] [address]    Report Status 11/11/2021 FINAL  Final  Blood Culture ID Panel (Reflexed)     Status: Abnormal   Collection Time: 11/09/21 10:51 AM  Result Value Ref Range Status   Enterococcus  faecalis NOT DETECTED NOT DETECTED Final   Enterococcus Faecium NOT DETECTED NOT DETECTED Final   Listeria monocytogenes NOT DETECTED NOT DETECTED Final   Staphylococcus species DETECTED (A) NOT DETECTED Final    Comment: CRITICAL RESULT CALLED TO, READ BACK BY AND VERIFIED WITHPeter Minium PHARMD [id number] 11/10/21 A BROWNING    Staphylococcus aureus (BCID) NOT DETECTED NOT DETECTED Final   Staphylococcus epidermidis DETECTED (A) NOT DETECTED Final    Comment: Methicillin (oxacillin) resistant coagulase negative staphylococcus. Possible blood culture contaminant (unless isolated from more than one blood culture draw or clinical case suggests pathogenicity). No antibiotic treatment is indicated for blood  culture contaminants. CRITICAL RESULT CALLED TO, READ BACK BY AND VERIFIED WITH: Peter Minium PHARMD [id number] 11/10/21 A BROWNING    Staphylococcus lugdunensis NOT DETECTED NOT DETECTED Final   Streptococcus species NOT DETECTED NOT DETECTED Final   Streptococcus agalactiae NOT DETECTED NOT DETECTED Final   Streptococcus pneumoniae NOT DETECTED NOT DETECTED Final   Streptococcus pyogenes NOT DETECTED NOT DETECTED Final   A.calcoaceticus-baumannii NOT DETECTED NOT DETECTED Final   Bacteroides fragilis NOT DETECTED NOT DETECTED Final   Enterobacterales  NOT DETECTED NOT DETECTED Final   Enterobacter cloacae complex NOT DETECTED NOT DETECTED Final   Escherichia coli NOT DETECTED NOT DETECTED Final   Klebsiella aerogenes NOT DETECTED NOT DETECTED Final   Klebsiella oxytoca NOT DETECTED NOT DETECTED Final   Klebsiella pneumoniae NOT DETECTED NOT DETECTED Final   Proteus species NOT DETECTED NOT DETECTED Final   Salmonella species NOT DETECTED NOT DETECTED Final   Serratia marcescens NOT DETECTED NOT DETECTED Final   Haemophilus influenzae NOT DETECTED NOT DETECTED Final   Neisseria meningitidis NOT DETECTED NOT DETECTED Final   Pseudomonas aeruginosa NOT DETECTED NOT DETECTED Final   Stenotrophomonas maltophilia NOT DETECTED NOT DETECTED Final   Candida albicans NOT DETECTED NOT DETECTED Final   Candida auris NOT DETECTED NOT DETECTED Final   Candida glabrata NOT DETECTED NOT DETECTED Final   Candida krusei NOT DETECTED NOT DETECTED Final   Candida parapsilosis NOT DETECTED NOT DETECTED Final   Candida tropicalis NOT DETECTED NOT DETECTED Final   Cryptococcus neoformans/gattii NOT DETECTED NOT DETECTED Final   Methicillin resistance mecA/C DETECTED (A) NOT DETECTED Final    Comment: CRITICAL RESULT CALLED TO, READ BACK BY AND VERIFIED WITHPeter Minium [facility] [id number] 11/10/21 A BROWNING Performed at [facility], [address], [place], [place] [address]   MRSA Next Gen by PCR, Nasal     Status: None   Collection Time: 11/09/21  1:31 PM   Specimen: Nasal Mucosa; Nasal Swab  Result Value Ref Range Status   MRSA by PCR Next Gen NOT DETECTED NOT DETECTED Final    Comment: (NOTE) The GeneXpert MRSA Assay (FDA approved for NASAL specimens only), is one component of a comprehensive MRSA colonization surveillance program. It is not intended to diagnose MRSA infection nor to guide or monitor treatment for MRSA infections. Test performance is not FDA approved in patients less than 61 years old. Performed at [facility], [address], [place], [place] [address]     Lab Basic Metabolic Panel: Recent Labs  Lab 11/10/21 [phone] 11/10/21 1623 11/11/21 0414 11/11/21 1542 11/12/21 0354 11/12/21 1612 11/13/21 0424 11/06/2021 0348  NA 134*   < > 133* 131* 128* 129* 129* 130*  K 5.3*   < > 4.6 4.2 4.2 4.3 4.1 4.1  CL 102   < > 100 98 97* 95* 95* 99  CO2 22   < > 23 20* 21* 18* 19* 18*  GLUCOSE 160*   < > 136* 259* 210* 247* 198* 83  BUN 109*   < > 87* 89* 78* 81* 75* 66*  CREATININE 2.60*   < > 2.17* 2.06* 2.01* 2.14* 1.94* 1.81*  CALCIUM 7.6*   < > 7.8* 7.7* 7.6* 8.2* 8.1* 7.8*  MG 2.6*  --  2.6*  --  2.7*  --  2.8* 2.9*  PHOS 2.9   < > 2.9 3.2 4.4 4.5 4.8* 5.4*   < > = values in this interval not displayed.   Liver Function Tests: Recent Labs  Lab 11/11/21 1542 11/12/21 0354 11/12/21 1612 11/13/21 0424 Dec 05, 2021 0348  ALBUMIN <1.5* 1.6* 1.6* 1.6* 1.5*   No results for input(s): LIPASE, AMYLASE in the last 168 hours. Recent Labs  Lab 11/08/21 1158 11/13/21 0822  AMMONIA 53* 56*   CBC: Recent Labs  Lab 11/11/21 0414 11/11/21 1755 11/12/21 0354 11/13/21 0424 2021/12/05 0348  WBC 36.0* 41.6* 46.2* 51.4* 51.4*  HGB 6.4* 7.7* 8.3* 7.8* 7.5*  HCT 19.7* 22.9* 24.4* 24.4* 23.5*  MCV 89.5 89.5 89.7 91.4 92.9  PLT 152 133* 168 188 164   Cardiac Enzymes: No results for input(s): CKTOTAL, CKMB, CKMBINDEX, TROPONINI in the last 168 hours. Sepsis Labs: Recent Labs  Lab 11/08/21 0813 11/09/21 0409 11/09/21 1055 11/09/21 1330 11/10/21 0332 11/11/21 0414 11/11/21 1755 11/12/21 0354 11/13/21 0424 2021-12-05 0348  PROCALCITON 26.99 16.53  --   --  22.89  --   --   --   --   --   WBC  --  43.8*  --   --  36.6* 36.0* 41.6* 46.2* 51.4* 51.4*  LATICACIDVEN  --   --  4.0* 2.7*  --  1.5  --   --   --   --     Procedures/Operations  As per EMR   Bonna Gains Jamell Opfer 12-05-2021, 2:54 PM

## 2021-11-27 NOTE — Progress Notes (Signed)
NAME:  WILIAN EBANKS, MRN:  [id number], DOB:  21-May-1956, LOS: 62 ADMISSION DATE:  10/23/2021, CONSULTATION DATE:  10/28/2021 REFERRING MD:  Dr. Carles Collet, CHIEF COMPLAINT:  Acute hypoxic respiratory failure    History of Present Illness:  66 yo male presented to [facility] ER with respiratory distress and hypoxia.  Found to have COPD exacerbation from Influenza A pneumonia and acute on chronic systolic CHF with acute pulmonary edema.  Started on amiodarone for a fib with RVR.  Failed Bipap and required intubation.  Pertinent  Medical History  CHF, COPD, CAD s/p CABG, HTN, HLD, MRSA bacteremia, and prior GI bleed  Significant Hospital Events:  11/28 admitted at [facility] for acute hypoxic respiratory failure. CT chest 10/11/2021 >> patchy GGO in upper lobes, faint GGO in lower lobes, moderate effusions Echo 10/25/21 >> EF 35 to 40%, mod MR, severe LA dilation Renal u/s 10/29/21 >> medical renal disease 12/2 intubated and [facility]  10/31/2021 CVVHD initiated; cefepime started 12/7 off vaso; remains on levo 12/8 restart vaso 12/10 off CRRT 12/11 off pressors; Tm 101.2, increased WBC 12/12: - central line and arterial line d/c'ed 12/12 12/13: Doppler of arms b >> indeterminate SVT Rt cephalic vein Doppler of legs b/l 11/08/21 >> no DVT, PCT elevated. WBC still in 63s. PCT elevated.  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  12/17 burkholderia (MDR) on bcx as well.  12/18 overnight increasing NE requirement, given degree of refractory shock DNR status agreed on 12/19 still in shock.  Getting fluid challenge.  Appears uncomfortable.  Family meeting arranged  Interim History / Subjective:  Still in shock  Objective   Blood pressure (Abnormal) 137/47, pulse 87, temperature (Abnormal) 97.2 F (36.2 C), temperature source Axillary, resp. rate 20, height 5\' 11"  (1.803 m), weight 75.5 kg,  SpO2 97 %.    Vent Mode: PRVC FiO2 (%):  [40 %] 40 % Set Rate:  [18 bmp] 18 bmp Vt Set:  [600 mL] 600 mL PEEP:  [5 cmH20] 5 cmH20 Pressure Support:  [5 cmH20] 5 cmH20 Plateau Pressure:  [15 cmH20-21 cmH20] 21 cmH20   Intake/Output Summary (Last 24 hours) at 11-24-2021 0725 Last data filed at 11-24-2021 0700 Gross per 24 hour  Intake 7127.93 ml  Output 3421 ml  Net 3706.93 ml   Filed Weights   11/12/21 0500 11/13/21 0415 2021/11/24 0500  Weight: 74.8 kg 71.7 kg 75.5 kg    Examination: General 66 year old white male remains on ventilatory support, in addition to high-dose vasoactive drips HEENT normocephalic atraumatic orally intubated Pulmonary: Diminished bases no accessory use Cardiac: Regular rate and rhythm Abdomen: Soft not tender has Flexi-Seal in place GU: Anuric Extremities: No significant edema Neuro: Awake, attempts to interact now, moves extremities but has diffuse and generalized weakness    Resolved Hospital Problem list   Elevated troponin from demand ischemia, Metabolic acidosis  Assessment & Plan:    Acute metabolic encephalopathy due to septic shock, hypoxia, renal failure  -Mental status improved, but he appears anxious, and responds yes when asked about being uncomfortable Plan Continue Precedex Continue via tube analgesia  Acute hypoxic/hypercapnic respiratory failure due to FluA PNA with ARDS, acute pulmonary edema and AECOPD Burkholderia PNA (HAP)  - intubated 12/2  Plan Antibiotics as per below VAP bundle PAD protocol Continue Yupelri, Pulmicort, Brovana, and as needed albuterol As needed chest x-ray    Septic shock/MODS due to burkholderia cepacia bacteremia  and pneumonia  - central line and arterial line d/c'ed 12/12 Plan Cont bactrim (started 12/16) and vanc length of tx TBD  Cont midodrine Cont pressors MAP goal > 65 LR bolus Goals of care discussion later this morning Ideally needs line holiday, but given his refractory shock  state this is not an option   Afib RVR - now sinus Acute on chronic systolic CHF Hx HTN, HLD, CAD  -Adv HF team following Plan Cont amio and heparin infusions Asa and lipitor  Holding enalapril, demadex, toprol and aldactone    AKI from ATN 2/2 septic shock and hypoxia superimposed on CKD IIIA Hyponatremia - nephro following - CRRT (started 12/5, had trial off but resumed 12/13) Plan Cont CRRT Serial chems Renal dose meds    Anemia of critical illness Plan Trend cbc Transfusion trigger < 7  DM2, poorly controlled, with hyperglycemia - holding outpt jardiance Plan Cont ssi and current q 4 hr novolog Cont Semglee  Superficial venous thrombosis R cephalic vein Plan Elevate right arm Cont AC    Sacral deep tissue injury  Plan  Cont WOC recs Turns Nutritional support  -  Best Practice (right click and "Reselect all SmartList Selections" daily)   Diet/type: tubefeeds DVT prophylaxis: systemic heparin GI prophylaxis: protonix Lines: d/c central line and arterial line 12/12; Right radial a line replaced 12/13, HD line from 12/3 remains in place Foley:  N/A Code Status:  DNR Family: family updated 12/17     Critical care time: 32 min    CRITICAL CARE Performed by: Shelby Mattocks

## 2021-11-27 NOTE — Procedures (Signed)
Extubation Procedure Note  Patient Details:   Name: Joseph [name] DOB: January 18, 1956 MRN: [id number]   Airway Documentation:    Vent end date: 2021/11/23 Vent end time: 1300   Evaluation  O2 sats: transiently fell during during procedure Complications: No apparent complications Patient did not tolerate procedure well. Bilateral Breath Sounds: Clear, Diminished   No  Richmond Campbell 11/23/2021, 1:07 PM

## 2021-11-27 NOTE — Progress Notes (Signed)
[facility] ROUNDING NOTE   Subjective:   Interval History: This is a 66 year old gentleman with septic shock probably secondary to pneumonia and acute hypoxic respiratory failure.  He has a complicating history of congestive heart failure with ejection fraction of 35 to 40%.  He has required CVVHD since 11/06/2021.  His hospital course has been complicated by atrial fibrillation with rapid ventricular rate as well as NSVT treated with amiodarone drip.  Baseline creatinine appears to be about 1.5 to 2 mg/dL.  Blood pressure 86/39 pulse 103 temperature 97.2  IV phenylephrine IV vasopressin IV norepinephrine  Sodium 130 potassium 4.1 chloride 99 CO2 18 BUN 6 6 creatinine 1.8 glucose 83 calcium 7.8 phosphorus 5.4 magnesium 2.9 albumin 1.5 WBC 51.4 hemoglobin 7.5  Objective:  Vital signs in last 24 hours:  Temp:  [97.2 F (36.2 C)-97.6 F (36.4 C)] 97.2 F (36.2 C) (12/19 0709) Pulse Rate:  [80-108] 103 (12/19 1013) Resp:  [17-29] 27 (12/19 1013) BP: (98-137)/(41-47) 107/41 (12/19 0730) SpO2:  [88 %-100 %] 88 % (12/19 1013) Arterial Line BP: (79-149)/(35-52) 137/46 (12/19 0900) FiO2 (%):  [40 %-50 %] 50 % (12/19 1013) Weight:  [75.5 kg] 75.5 kg (12/19 0500)  Weight change: 3.8 kg Filed Weights   11/12/21 0500 11/13/21 0415 December 06, 2021 0500  Weight: 74.8 kg 71.7 kg 75.5 kg    Intake/Output: I/O last 3 completed shifts: In: 10092.9 [I.V.:3884.4; NG/GT:2920; IV Piggyback:3288.5] Out: 2956 [OZHYQ:6578; Stool:825]   Intake/Output this shift:  Total I/O In: 1855.9 [I.V.:460.5; NG/GT:395; IV Piggyback:1000.4] Out: 463 [Other:288; Stool:175]  General: Critically ill looking male, sedated and intubated, NAD Heart: RRR Lungs: Coarse breath sounds bilaterally, no wheezing Abdomen:soft,  non-distended Extremities:No edema of BLE  Neuro: Opens eyes spontaneously, not following commands  Dialysis Access: Temporary HD catheter in place.   Basic Metabolic Panel: Recent  Labs  Lab 11/10/21 0332 11/10/21 1623 11/11/21 0414 11/11/21 1542 11/12/21 0354 11/12/21 1612 11/13/21 0424 06-Dec-2021 0348  NA 134*   < > 133* 131* 128* 129* 129* 130*  K 5.3*   < > 4.6 4.2 4.2 4.3 4.1 4.1  CL 102   < > 100 98 97* 95* 95* 99  CO2 22   < > 23 20* 21* 18* 19* 18*  GLUCOSE 160*   < > 136* 259* 210* 247* 198* 83  BUN 109*   < > 87* 89* 78* 81* 75* 66*  CREATININE 2.60*   < > 2.17* 2.06* 2.01* 2.14* 1.94* 1.81*  CALCIUM 7.6*   < > 7.8* 7.7* 7.6* 8.2* 8.1* 7.8*  MG 2.6*  --  2.6*  --  2.7*  --  2.8* 2.9*  PHOS 2.9   < > 2.9 3.2 4.4 4.5 4.8* 5.4*   < > = values in this interval not displayed.     Liver Function Tests: Recent Labs  Lab 11/11/21 1542 11/12/21 0354 11/12/21 1612 11/13/21 0424 06-Dec-2021 0348  ALBUMIN <1.5* 1.6* 1.6* 1.6* 1.5*    No results for input(s): LIPASE, AMYLASE in the last 168 hours. Recent Labs  Lab 11/08/21 1158 11/13/21 0822  AMMONIA 53* 56*     CBC: Recent Labs  Lab 11/11/21 0414 11/11/21 1755 11/12/21 0354 11/13/21 0424 12-06-21 0348  WBC 36.0* 41.6* 46.2* 51.4* 51.4*  HGB 6.4* 7.7* 8.3* 7.8* 7.5*  HCT 19.7* 22.9* 24.4* 24.4* 23.5*  MCV 89.5 89.5 89.7 91.4 92.9  PLT 152 133* 168 188 164     Cardiac Enzymes: No results for input(s): CKTOTAL, CKMB, CKMBINDEX, TROPONINI  in the last 168 hours.  BNP: Invalid input(s): POCBNP  CBG: Recent Labs  Lab 11/13/21 1607 11/13/21 1957 11/13/21 2327 11/15/21 0354 2021-11-15 0708  GLUCAP 191* 141* 159* 82 152*     Microbiology: Results for orders placed or performed during the hospital encounter of 10/21/2021  Resp Panel by RT-PCR (Flu A&B, Covid) Nasopharyngeal Swab     Status: Abnormal   Collection Time: 09/28/2021  6:28 PM   Specimen: Nasopharyngeal Swab; Nasopharyngeal(NP) swabs in vial transport medium  Result Value Ref Range Status   SARS Coronavirus 2 by RT PCR NEGATIVE NEGATIVE Final    Comment: (NOTE) SARS-CoV-2 target nucleic acids are NOT DETECTED.  The  SARS-CoV-2 RNA is generally detectable in upper respiratory specimens during the acute phase of infection. The lowest concentration of SARS-CoV-2 viral copies this assay can detect is 138 copies/mL. A negative result does not preclude SARS-Cov-2 infection and should not be used as the sole basis for treatment or other patient management decisions. A negative result may occur with  improper specimen collection/handling, submission of specimen other than nasopharyngeal swab, presence of viral mutation(s) within the areas targeted by this assay, and inadequate number of viral copies(<138 copies/mL). A negative result must be combined with clinical observations, patient history, and epidemiological information. The expected result is Negative.  Fact Sheet for Patients:  EntrepreneurPulse.com.au  Fact Sheet for Healthcare Providers:  IncredibleEmployment.be  This test is no t yet approved or cleared by the [place] FDA and  has been authorized for detection and/or diagnosis of SARS-CoV-2 by FDA under an Emergency Use Authorization (EUA). This EUA will remain  in effect (meaning this test can be used) for the duration of the COVID-19 declaration under Section 564(b)(1) of the Act, 21 U.S.C.section 360bbb-3(b)(1), unless the authorization is terminated  or revoked sooner.       Influenza A by PCR POSITIVE (A) NEGATIVE Final   Influenza B by PCR NEGATIVE NEGATIVE Final    Comment: (NOTE) The Xpert Xpress SARS-CoV-2/FLU/RSV plus assay is intended as an aid in the diagnosis of influenza from Nasopharyngeal swab specimens and should not be used as a sole basis for treatment. Nasal washings and aspirates are unacceptable for Xpert Xpress SARS-CoV-2/FLU/RSV testing.  Fact Sheet for Patients: EntrepreneurPulse.com.au  Fact Sheet for Healthcare Providers: IncredibleEmployment.be  This test is not yet approved or  cleared by the [place] FDA and has been authorized for detection and/or diagnosis of SARS-CoV-2 by FDA under an Emergency Use Authorization (EUA). This EUA will remain in effect (meaning this test can be used) for the duration of the COVID-19 declaration under Section 564(b)(1) of the Act, 21 U.S.C. section 360bbb-3(b)(1), unless the authorization is terminated or revoked.  Performed at [facility], [address] [place], [place] [address]   Culture, blood (routine x 2)     Status: None   Collection Time: 10/07/2021  7:09 PM   Specimen: Left Antecubital; Blood  Result Value Ref Range Status   Specimen Description LEFT ANTECUBITAL  Final   Special Requests   Final    BOTTLES DRAWN AEROBIC AND ANAEROBIC Blood Culture results may not be optimal due to an inadequate volume of blood received in culture bottles   Culture   Final    NO GROWTH 5 DAYS Performed at [facility], [address], [place], [place] [address]    Report Status 10/29/2021 FINAL  Final  Culture, blood (routine x 2)     Status: None   Collection Time: 10/11/2021  7:11 PM   Specimen: BLOOD RIGHT FOREARM  Result Value Ref Range Status   Specimen Description BLOOD RIGHT FOREARM  Final   Special Requests   Final    BOTTLES DRAWN AEROBIC AND ANAEROBIC Blood Culture results may not be optimal due to an inadequate volume of blood received in culture bottles   Culture   Final    NO GROWTH 5 DAYS Performed at [facility], [address], [place], [place] [address]    Report Status 10/29/2021 FINAL  Final  MRSA Next Gen by PCR, Nasal     Status: None   Collection Time: 10/25/21 12:32 AM   Specimen: Nasal Mucosa; Nasal Swab  Result Value Ref Range Status   MRSA by PCR Next Gen NOT DETECTED NOT DETECTED Final    Comment: (NOTE) The GeneXpert MRSA Assay (FDA approved for NASAL specimens only), is one component of a comprehensive MRSA colonization surveillance program. It is not intended to diagnose MRSA infection nor to  guide or monitor treatment for MRSA infections. Test performance is not FDA approved in patients less than 50 years old. Performed at [facility], [address], [place], [place] [address]   Culture, Respiratory w Gram Stain     Status: None   Collection Time: 10/28/21  3:31 PM   Specimen: Tracheal Aspirate; Respiratory  Result Value Ref Range Status   Specimen Description TRACHEAL ASPIRATE  Final   Special Requests NONE  Final   Gram Stain   Final    RARE WBC PRESENT, PREDOMINANTLY MONONUCLEAR NO ORGANISMS SEEN    Culture   Final    NO GROWTH 2 DAYS Performed at [facility], [address], [place], [place] [address]    Report Status 10/31/2021 FINAL  Final  Culture, Respiratory w Gram Stain     Status: None   Collection Time: 11/01/21  9:23 AM   Specimen: Tracheal Aspirate; Respiratory  Result Value Ref Range Status   Specimen Description TRACHEAL ASPIRATE  Final   Special Requests NONE  Final   Gram Stain   Final    FEW WBC PRESENT, PREDOMINANTLY MONONUCLEAR MODERATE YEAST Performed at [facility], [address], [place], [place] [address]    Culture FEW CANDIDA TROPICALIS  Final   Report Status 11/04/2021 FINAL  Final  Culture, Respiratory w Gram Stain     Status: None   Collection Time: 11/09/21 10:16 AM   Specimen: Tracheal Aspirate; Respiratory  Result Value Ref Range Status   Specimen Description TRACHEAL ASPIRATE  Final   Special Requests NONE  Final   Gram Stain   Final    ABUNDANT WBC PRESENT,BOTH PMN AND MONONUCLEAR ABUNDANT GRAM NEGATIVE RODS FEW GRAM VARIABLE ROD FEW GRAM POSITIVE COCCI Performed at [facility], [place] [address], [place], [place] [address]    Culture ABUNDANT BURKHOLDERIA CEPACIA  Final   Report Status 11/11/2021 FINAL  Final   Organism ID, Bacteria BURKHOLDERIA CEPACIA  Final      Susceptibility   Burkholderia cepacia - MIC*    CEFAZOLIN >=64 RESISTANT Resistant     GENTAMICIN >=16 RESISTANT Resistant      CIPROFLOXACIN 1 SENSITIVE Sensitive     IMIPENEM >=16 RESISTANT Resistant     TRIMETH/SULFA <=20 SENSITIVE Sensitive     * ABUNDANT BURKHOLDERIA CEPACIA  Culture, blood (routine x 2)     Status: Abnormal   Collection Time: 11/09/21 10:35 AM   Specimen: BLOOD  Result Value Ref Range Status  Specimen Description BLOOD BLOOD LEFT HAND  Final   Special Requests   Final    BOTTLES DRAWN AEROBIC ONLY Blood Culture results may not be optimal due to an inadequate volume of blood received in culture bottles   Culture  Setup Time   Final    GRAM NEGATIVE RODS AEROBIC BOTTLE ONLY CRITICAL RESULT CALLED TO, READ BACK BY AND VERIFIED WITHJiles Garter [facility] 4034 11/10/21 A BROWNING Performed at [facility], [place] [address], [place], [place] [address]    Culture BURKHOLDERIA CEPACIA (A)  Final   Report Status 11/12/2021 FINAL  Final   Organism ID, Bacteria BURKHOLDERIA CEPACIA  Final      Susceptibility   Burkholderia cepacia - MIC*    CEFAZOLIN >=64 RESISTANT Resistant     GENTAMICIN >=16 RESISTANT Resistant     CIPROFLOXACIN 1 SENSITIVE Sensitive     IMIPENEM >=16 RESISTANT Resistant     TRIMETH/SULFA <=20 SENSITIVE Sensitive     * BURKHOLDERIA CEPACIA  Blood Culture ID Panel (Reflexed)     Status: None   Collection Time: 11/09/21 10:35 AM  Result Value Ref Range Status   Enterococcus faecalis NOT DETECTED NOT DETECTED Final   Enterococcus Faecium NOT DETECTED NOT DETECTED Final   Listeria monocytogenes NOT DETECTED NOT DETECTED Final   Staphylococcus species NOT DETECTED NOT DETECTED Final   Staphylococcus aureus (BCID) NOT DETECTED NOT DETECTED Final   Staphylococcus epidermidis NOT DETECTED NOT DETECTED Final   Staphylococcus lugdunensis NOT DETECTED NOT DETECTED Final   Streptococcus species NOT DETECTED NOT DETECTED Final   Streptococcus agalactiae NOT DETECTED NOT DETECTED Final   Streptococcus pneumoniae NOT DETECTED NOT DETECTED Final   Streptococcus pyogenes NOT  DETECTED NOT DETECTED Final   A.calcoaceticus-baumannii NOT DETECTED NOT DETECTED Final   Bacteroides fragilis NOT DETECTED NOT DETECTED Final   Enterobacterales NOT DETECTED NOT DETECTED Final   Enterobacter cloacae complex NOT DETECTED NOT DETECTED Final   Escherichia coli NOT DETECTED NOT DETECTED Final   Klebsiella aerogenes NOT DETECTED NOT DETECTED Final   Klebsiella oxytoca NOT DETECTED NOT DETECTED Final   Klebsiella pneumoniae NOT DETECTED NOT DETECTED Final   Proteus species NOT DETECTED NOT DETECTED Final   Salmonella species NOT DETECTED NOT DETECTED Final   Serratia marcescens NOT DETECTED NOT DETECTED Final   Haemophilus influenzae NOT DETECTED NOT DETECTED Final   Neisseria meningitidis NOT DETECTED NOT DETECTED Final   Pseudomonas aeruginosa NOT DETECTED NOT DETECTED Final   Stenotrophomonas maltophilia NOT DETECTED NOT DETECTED Final   Candida albicans NOT DETECTED NOT DETECTED Final   Candida auris NOT DETECTED NOT DETECTED Final   Candida glabrata NOT DETECTED NOT DETECTED Final   Candida krusei NOT DETECTED NOT DETECTED Final   Candida parapsilosis NOT DETECTED NOT DETECTED Final   Candida tropicalis NOT DETECTED NOT DETECTED Final   Cryptococcus neoformans/gattii NOT DETECTED NOT DETECTED Final    Comment: Performed at [facility], [address], [place], [place] [address]  Culture, blood (routine x 2)     Status: Abnormal   Collection Time: 11/09/21 10:51 AM   Specimen: BLOOD  Result Value Ref Range Status   Specimen Description BLOOD BLOOD RIGHT HAND  Final   Special Requests   Final    BOTTLES DRAWN AEROBIC ONLY Blood Culture results may not be optimal due to an inadequate volume of blood received in culture bottles   Culture  Setup Time   Final    GRAM POSITIVE COCCI AEROBIC  BOTTLE ONLY Organism ID to follow CRITICAL RESULT CALLED TO, READ BACK BY AND VERIFIED WITHAndres Shad PHARMD [id number] 11/10/21 A BROWNING    Culture (A)  Final    STAPHYLOCOCCUS  EPIDERMIDIS THE SIGNIFICANCE OF ISOLATING THIS ORGANISM FROM A SINGLE SET OF BLOOD CULTURES WHEN MULTIPLE SETS ARE DRAWN IS UNCERTAIN. PLEASE NOTIFY THE MICROBIOLOGY DEPARTMENT WITHIN ONE WEEK IF SPECIATION AND SENSITIVITIES ARE REQUIRED. Performed at [facility], [place] [address], [place], [place] [address]    Report Status 11/11/2021 FINAL  Final  Blood Culture ID Panel (Reflexed)     Status: Abnormal   Collection Time: 11/09/21 10:51 AM  Result Value Ref Range Status   Enterococcus faecalis NOT DETECTED NOT DETECTED Final   Enterococcus Faecium NOT DETECTED NOT DETECTED Final   Listeria monocytogenes NOT DETECTED NOT DETECTED Final   Staphylococcus species DETECTED (A) NOT DETECTED Final    Comment: CRITICAL RESULT CALLED TO, READ BACK BY AND VERIFIED WITHAndres Shad PHARMD [id number] 11/10/21 A BROWNING    Staphylococcus aureus (BCID) NOT DETECTED NOT DETECTED Final   Staphylococcus epidermidis DETECTED (A) NOT DETECTED Final    Comment: Methicillin (oxacillin) resistant coagulase negative staphylococcus. Possible blood culture contaminant (unless isolated from more than one blood culture draw or clinical case suggests pathogenicity). No antibiotic treatment is indicated for blood  culture contaminants. CRITICAL RESULT CALLED TO, READ BACK BY AND VERIFIED WITH: Andres Shad PHARMD [id number] 11/10/21 A BROWNING    Staphylococcus lugdunensis NOT DETECTED NOT DETECTED Final   Streptococcus species NOT DETECTED NOT DETECTED Final   Streptococcus agalactiae NOT DETECTED NOT DETECTED Final   Streptococcus pneumoniae NOT DETECTED NOT DETECTED Final   Streptococcus pyogenes NOT DETECTED NOT DETECTED Final   A.calcoaceticus-baumannii NOT DETECTED NOT DETECTED Final   Bacteroides fragilis NOT DETECTED NOT DETECTED Final   Enterobacterales NOT DETECTED NOT DETECTED Final   Enterobacter cloacae complex NOT DETECTED NOT DETECTED Final   Escherichia coli NOT DETECTED NOT DETECTED Final   Klebsiella  aerogenes NOT DETECTED NOT DETECTED Final   Klebsiella oxytoca NOT DETECTED NOT DETECTED Final   Klebsiella pneumoniae NOT DETECTED NOT DETECTED Final   Proteus species NOT DETECTED NOT DETECTED Final   Salmonella species NOT DETECTED NOT DETECTED Final   Serratia marcescens NOT DETECTED NOT DETECTED Final   Haemophilus influenzae NOT DETECTED NOT DETECTED Final   Neisseria meningitidis NOT DETECTED NOT DETECTED Final   Pseudomonas aeruginosa NOT DETECTED NOT DETECTED Final   Stenotrophomonas maltophilia NOT DETECTED NOT DETECTED Final   Candida albicans NOT DETECTED NOT DETECTED Final   Candida auris NOT DETECTED NOT DETECTED Final   Candida glabrata NOT DETECTED NOT DETECTED Final   Candida krusei NOT DETECTED NOT DETECTED Final   Candida parapsilosis NOT DETECTED NOT DETECTED Final   Candida tropicalis NOT DETECTED NOT DETECTED Final   Cryptococcus neoformans/gattii NOT DETECTED NOT DETECTED Final   Methicillin resistance mecA/C DETECTED (A) NOT DETECTED Final    Comment: CRITICAL RESULT CALLED TO, READ BACK BY AND VERIFIED WITHAndres Shad [facility] [id number] 11/10/21 A BROWNING Performed at [facility], [address], [place], [place] [address]   MRSA Next Gen by PCR, Nasal     Status: None   Collection Time: 11/09/21  1:31 PM   Specimen: Nasal Mucosa; Nasal Swab  Result Value Ref Range Status   MRSA by PCR Next Gen NOT DETECTED NOT DETECTED Final    Comment: (NOTE) The GeneXpert MRSA Assay (FDA approved for NASAL specimens only), is  one component of a comprehensive MRSA colonization surveillance program. It is not intended to diagnose MRSA infection nor to guide or monitor treatment for MRSA infections. Test performance is not FDA approved in patients less than 51 years old. Performed at [facility], [place] [address], [place], [place] [address]     Coagulation Studies: No results for input(s): LABPROT, INR in the last 72 hours.  Urinalysis: No results for input(s):  COLORURINE, LABSPEC, PHURINE, GLUCOSEU, HGBUR, BILIRUBINUR, KETONESUR, PROTEINUR, UROBILINOGEN, NITRITE, LEUKOCYTESUR in the last 72 hours.  Invalid input(s): APPERANCEUR    Imaging: DG CHEST PORT 1 VIEW  Result Date: 12/05/2021 CLINICAL DATA:  Pneumonia follow-up. EXAM: PORTABLE CHEST 1 VIEW COMPARISON:  Portable chest 11/10/2021. FINDINGS: There is mild cardiomegaly with CABG changes and left chest single lead cardiac assist device, unchanged. Right IJ dialysis catheter again terminates at about the cavoatrial junction, ETT and enteric tube remain adequately inserted. No vascular congestion is seen. There are persistent interstitial and patchy infiltrates of the right greater than left upper lobes, without improvement or worsening. Remainder of the lungs are clear. No pleural effusion is seen. No pneumothorax. Osteopenia. IMPRESSION: No changes in the overall aeration and upper zone lung opacities, right greater than left. Support devices are unaltered. Electronically Signed   By: Telford Nab M.D.   On: 2021/12/05 05:52     Medications:     prismasol BGK 4/2.5 200 mL/hr at 11/13/21 2009   sodium chloride Stopped (11/07/21 0835)   sodium chloride Stopped (11/10/21 0651)   sodium chloride Stopped (11/09/21 2147)   amiodarone 30 mg/hr (12/05/21 1000)   dexmedetomidine (PRECEDEX) IV infusion 1.2 mcg/kg/hr (12-05-2021 1000)   feeding supplement (VITAL 1.5 CAL) 1,000 mL (05-Dec-2021 0949)   heparin 1,000 Units/hr (12-05-2021 1000)   norepinephrine (LEVOPHED) Adult infusion 36 mcg/min (Dec 05, 2021 1000)   phenylephrine (NEO-SYNEPHRINE) Adult infusion 90 mcg/min (05-Dec-2021 1015)   prismasol BGK 2/2.5 dialysis solution 1,000 mL/hr at 12-05-21 1011   prismasol BGK 2/2.5 replacement solution 200 mL/hr at 11/13/21 1531   promethazine (PHENERGAN) injection (IM or IVPB)     sulfamethoxazole-trimethoprim Stopped (05-Dec-2021 7628)   vancomycin Stopped (11/13/21 1225)   vasopressin 0.03 Units/min (12/05/21 1000)     sodium chloride   Intravenous Once   amiodarone  150 mg Intravenous Once   arformoterol  15 mcg Nebulization BID   vitamin C  250 mg Per Tube BID   aspirin  81 mg Per Tube Daily   atorvastatin  80 mg Per Tube QPM   B-complex with vitamin C  1 tablet Per Tube Daily   budesonide (PULMICORT) nebulizer solution  0.5 mg Nebulization BID   chlorhexidine gluconate (MEDLINE KIT)  15 mL Mouth Rinse BID   Chlorhexidine Gluconate Cloth  6 each Topical Daily   feeding supplement (PROSource TF)  90 mL Per Tube TID   insulin aspart  0-20 Units Subcutaneous Q4H   insulin aspart  12 Units Subcutaneous Q4H   insulin glargine-yfgn  40 Units Subcutaneous BID   lactulose  30 g Per Tube Daily   mouth rinse  15 mL Mouth Rinse 10 times per day   midodrine  10 mg Per Tube Q8H   nutrition supplement (JUVEN)  1 packet Per Tube BID BM   oxyCODONE  5 mg Per Tube Q6H   pantoprazole sodium  40 mg Per Tube Daily   polyvinyl alcohol  1 drop Both Eyes BID   revefenacin  175 mcg Nebulization Daily   sodium chloride flush  10-40 mL Intracatheter Q12H   Place/Maintain arterial line **AND** sodium chloride, sodium chloride, acetaminophen (TYLENOL) oral liquid 160 mg/5 mL, acetaminophen **OR** acetaminophen, albuterol, fentaNYL (SUBLIMAZE) injection, heparin, hydrALAZINE, labetalol, polyethylene glycol, polyvinyl alcohol, promethazine (PHENERGAN) injection (IM or IVPB), senna-docusate, sodium chloride, sodium chloride flush  Assessment/ Plan:  Acute kidney injury on CKD IIIa: Likely 2/2 ATN due to septic shock.  Follows with [facility] as an outpatient.  Started CRRT on 12/5 after refractory to Lasix challenge.  Volume status and electrolytes significantly improved on CRRT.  CRRT briefly stopped but resumed 12/13 as patient had markedly elevated BUN. His BUN/sCr are down trending on CRRT, He continues to require pressor support to maintain his blood pressures. Remains anuric.  - We will continue CRRT, slowly remove fluid (~net  neg 50-100cc/h)   2. Acute hypoxic/hypercapnic respiratory failure/ARDS/influenza A: Currently on cefepime and vancomycin vent per PCCM.   3. Septic shock: On pressors now however in the setting of CRRT.  Marked leukocytosis mildly improved, there is no evidence of infection at the site of insertion of his catheter. Blood cultures x2 12/14 MRS epi and GNR speciation pending, resp Cx with burkholderia cepacia, currently on vancomycin, cefepime. ID following.    4. Acute on chronic systolic heart failure due to ischemic cardiomyopathy: EF 35 to 40%.  Initially trialed on IV diuretics without response therefore volume optimized with CRRT.  Euvolemic now.  Cardiology is following.     5. Hyponatremia, hypervolemic: Managed with CRRT.  Continue fluid restriction.   6. Afib with RVR Continue amiodarone and heparin infusion   Discussing possibility of a line holiday due to ongoing leukocytosis  Unfortunate situation with patient making very little progress.  Daughter at bedside.  Family meeting with critical care medicine to discuss goals of care   LOS: [place] '@TODAY' '@10' :19 AM

## 2021-11-27 DEATH — deceased

## 2021-12-14 ENCOUNTER — Ambulatory Visit: Payer: Medicare HMO | Admitting: Family Medicine

## 2022-01-12 IMAGING — DX DG CHEST 1V PORT
1 series · 1 of 1 positions shown · non-contrast
Comparison: One-view chest x-ray 09/06/2020 at [HOSPITAL] [REDACTED].

CLINICAL DATA: Shortness of breath.  Recent diagnosis of pneumonia.

EXAM:
PORTABLE CHEST 1 VIEW

[chest ap]
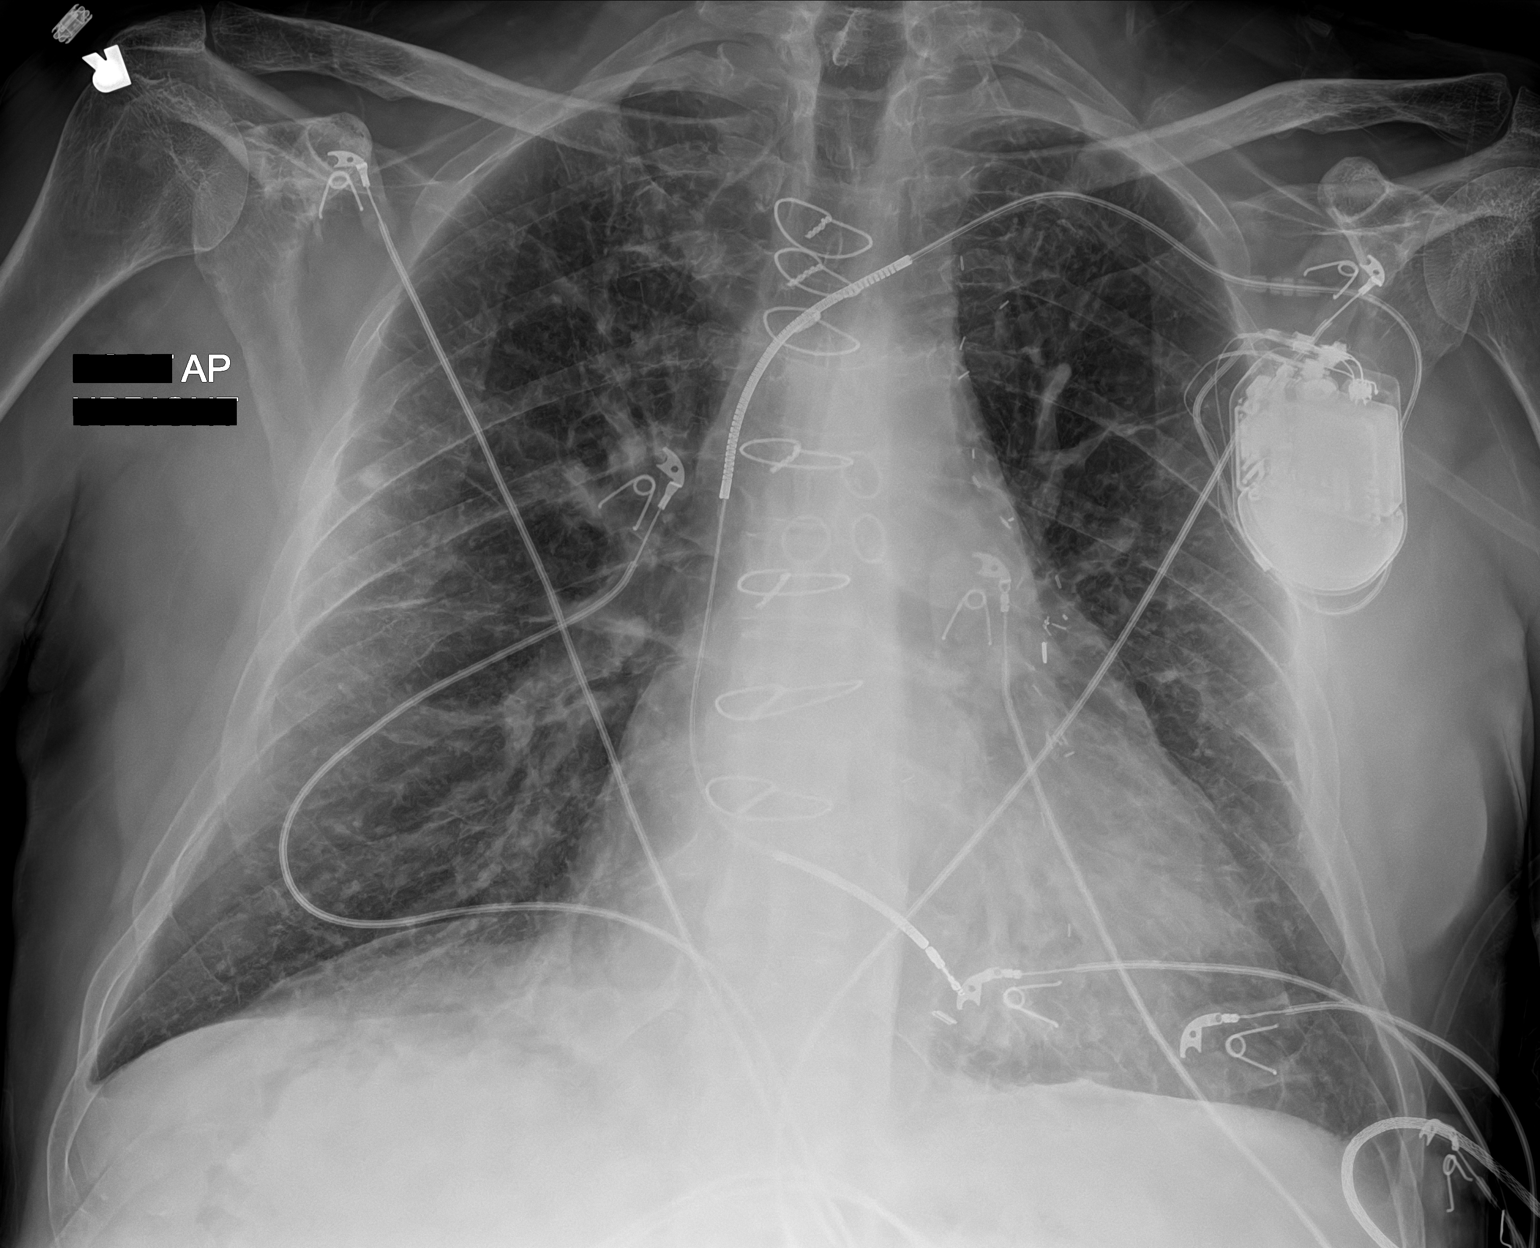

[1 of 1 positions shown; findings below may reference images not displayed]

FINDINGS: Heart is enlarged. Pacing wire is stable. Chronic interstitial
coarsening is again noted. Superimposed airspace disease has
improved. Mild interstitial asymmetry remains on the right. Small
right pleural effusion is suspected.
IMPRESSION: 1. Improved aeration with persistent mild interstitial asymmetry on
the right and probable small right pleural effusion.
2. Stable cardiomegaly.

## 2022-02-08 ENCOUNTER — Ambulatory Visit: Payer: Medicare HMO | Admitting: Student
